# Patient Record
Sex: Female | Born: 1935
Health system: Southern US, Community
[De-identification: ages and names within clinical notes are randomized; demographics above are authoritative.]

## PROBLEM LIST (undated history)

## (undated) DIAGNOSIS — C50919 Malignant neoplasm of unspecified site of unspecified female breast: Secondary | ICD-10-CM

## (undated) DIAGNOSIS — N186 End stage renal disease: Secondary | ICD-10-CM

## (undated) DIAGNOSIS — E119 Type 2 diabetes mellitus without complications: Secondary | ICD-10-CM

## (undated) DIAGNOSIS — S82209A Unspecified fracture of shaft of unspecified tibia, initial encounter for closed fracture: Secondary | ICD-10-CM

## (undated) DIAGNOSIS — E785 Hyperlipidemia, unspecified: Secondary | ICD-10-CM

## (undated) DIAGNOSIS — E039 Hypothyroidism, unspecified: Secondary | ICD-10-CM

## (undated) DIAGNOSIS — I4891 Unspecified atrial fibrillation: Secondary | ICD-10-CM

## (undated) DIAGNOSIS — N2581 Secondary hyperparathyroidism of renal origin: Secondary | ICD-10-CM

## (undated) DIAGNOSIS — S82201A Unspecified fracture of shaft of right tibia, initial encounter for closed fracture: Secondary | ICD-10-CM

## (undated) DIAGNOSIS — R0602 Shortness of breath: Secondary | ICD-10-CM

## (undated) DIAGNOSIS — I10 Essential (primary) hypertension: Secondary | ICD-10-CM

## (undated) DIAGNOSIS — I509 Heart failure, unspecified: Secondary | ICD-10-CM

## (undated) DIAGNOSIS — S82409A Unspecified fracture of shaft of unspecified fibula, initial encounter for closed fracture: Secondary | ICD-10-CM

## (undated) DIAGNOSIS — M1990 Unspecified osteoarthritis, unspecified site: Secondary | ICD-10-CM

## (undated) HISTORY — DX: Hypothyroidism, unspecified: E03.9

## (undated) HISTORY — PX: MASTECTOMY: SHX3

## (undated) HISTORY — PX: EYE SURGERY: SHX253

## (undated) HISTORY — DX: Malignant neoplasm of unspecified site of unspecified female breast: C50.919

## (undated) HISTORY — PX: THYROID SURGERY: SHX805

## (undated) HISTORY — PX: ARTERIOVENOUS GRAFT PLACEMENT: SUR1029

## (undated) HISTORY — PX: ANKLE FUSION: SHX881

## (undated) HISTORY — PX: ABDOMINAL SURGERY: SHX537

## (undated) HISTORY — DX: End stage renal disease: N18.6

## (undated) HISTORY — DX: Unspecified atrial fibrillation: I48.91

## (undated) HISTORY — DX: Unspecified fracture of shaft of right tibia, initial encounter for closed fracture: S82.201A

## (undated) HISTORY — PX: BREAST SURGERY: SHX581

## (undated) HISTORY — DX: Unspecified fracture of shaft of unspecified fibula, initial encounter for closed fracture: S82.409A

## (undated) HISTORY — DX: Unspecified fracture of shaft of unspecified tibia, initial encounter for closed fracture: S82.209A

## (undated) HISTORY — DX: Hyperlipidemia, unspecified: E78.5

## (undated) HISTORY — DX: Essential (primary) hypertension: I10

## (undated) HISTORY — DX: Type 2 diabetes mellitus without complications: E11.9

---

## 2009-08-27 ENCOUNTER — Inpatient Hospital Stay (HOSPITAL_COMMUNITY)
Admission: EM | Admit: 2009-08-27 | Discharge: 2009-09-02 | Payer: Self-pay | Source: Home / Self Care | Admitting: Emergency Medicine

## 2009-08-27 ENCOUNTER — Ambulatory Visit: Payer: Self-pay | Admitting: Pulmonary Disease

## 2009-08-27 ENCOUNTER — Encounter (INDEPENDENT_AMBULATORY_CARE_PROVIDER_SITE_OTHER): Payer: Self-pay | Admitting: Internal Medicine

## 2009-08-28 ENCOUNTER — Encounter (INDEPENDENT_AMBULATORY_CARE_PROVIDER_SITE_OTHER): Payer: Self-pay | Admitting: Internal Medicine

## 2009-08-30 ENCOUNTER — Encounter (INDEPENDENT_AMBULATORY_CARE_PROVIDER_SITE_OTHER): Payer: Self-pay | Admitting: Internal Medicine

## 2009-08-31 ENCOUNTER — Ambulatory Visit: Payer: Self-pay | Admitting: Vascular Surgery

## 2009-09-19 ENCOUNTER — Ambulatory Visit: Payer: Self-pay | Admitting: Vascular Surgery

## 2009-10-06 ENCOUNTER — Encounter: Admission: RE | Admit: 2009-10-06 | Discharge: 2009-10-06 | Payer: Self-pay | Admitting: Nephrology

## 2009-10-09 ENCOUNTER — Ambulatory Visit (HOSPITAL_COMMUNITY): Admission: RE | Admit: 2009-10-09 | Discharge: 2009-10-09 | Payer: Self-pay | Admitting: Vascular Surgery

## 2009-10-09 ENCOUNTER — Ambulatory Visit: Payer: Self-pay | Admitting: Vascular Surgery

## 2009-10-16 DEATH — deceased

## 2010-06-03 LAB — DIFFERENTIAL
Basophils Absolute: 0 10*3/uL (ref 0.0–0.1)
Basophils Absolute: 0 10*3/uL (ref 0.0–0.1)
Basophils Relative: 0 % (ref 0–1)
Eosinophils Relative: 0 % (ref 0–5)
Eosinophils Relative: 1 % (ref 0–5)
Lymphocytes Relative: 7 % — ABNORMAL LOW (ref 12–46)
Lymphocytes Relative: 8 % — ABNORMAL LOW (ref 12–46)
Lymphs Abs: 1.2 10*3/uL (ref 0.7–4.0)
Lymphs Abs: 1.5 10*3/uL (ref 0.7–4.0)
Neutro Abs: 12.4 10*3/uL — ABNORMAL HIGH (ref 1.7–7.7)

## 2010-06-03 LAB — URINALYSIS, ROUTINE W REFLEX MICROSCOPIC
Nitrite: NEGATIVE
Specific Gravity, Urine: 1.027 (ref 1.005–1.030)
pH: 6 (ref 5.0–8.0)

## 2010-06-03 LAB — RENAL FUNCTION PANEL
Albumin: 1.6 g/dL — ABNORMAL LOW (ref 3.5–5.2)
Albumin: 1.7 g/dL — ABNORMAL LOW (ref 3.5–5.2)
BUN: 29 mg/dL — ABNORMAL HIGH (ref 6–23)
CO2: 25 mEq/L (ref 19–32)
CO2: 26 mEq/L (ref 19–32)
Calcium: 7.2 mg/dL — ABNORMAL LOW (ref 8.4–10.5)
Calcium: 7.8 mg/dL — ABNORMAL LOW (ref 8.4–10.5)
Chloride: 99 mEq/L (ref 96–112)
Creatinine, Ser: 4.19 mg/dL — ABNORMAL HIGH (ref 0.4–1.2)
GFR calc Af Amer: 12 mL/min — ABNORMAL LOW (ref >60)
GFR calc Af Amer: 7 mL/min — ABNORMAL LOW (ref >60)
GFR calc Af Amer: 7 mL/min — ABNORMAL LOW (ref >60)
GFR calc non Af Amer: 10 mL/min — ABNORMAL LOW (ref >60)
GFR calc non Af Amer: 6 mL/min — ABNORMAL LOW (ref >60)
Glucose, Bld: 162 mg/dL — ABNORMAL HIGH (ref 70–99)
Phosphorus: 3.7 mg/dL (ref 2.3–4.6)
Potassium: 3.7 mEq/L (ref 3.5–5.1)
Potassium: 3.8 mEq/L (ref 3.5–5.1)
Sodium: 135 mEq/L (ref 135–145)
Sodium: 136 mEq/L (ref 135–145)

## 2010-06-03 LAB — GLUCOSE, CAPILLARY
Glucose-Capillary: 120 mg/dL — ABNORMAL HIGH (ref 70–99)
Glucose-Capillary: 123 mg/dL — ABNORMAL HIGH (ref 70–99)
Glucose-Capillary: 145 mg/dL — ABNORMAL HIGH (ref 70–99)
Glucose-Capillary: 162 mg/dL — ABNORMAL HIGH (ref 70–99)
Glucose-Capillary: 172 mg/dL — ABNORMAL HIGH (ref 70–99)
Glucose-Capillary: 185 mg/dL — ABNORMAL HIGH (ref 70–99)
Glucose-Capillary: 19 mg/dL — CL (ref 70–99)
Glucose-Capillary: 193 mg/dL — ABNORMAL HIGH (ref 70–99)
Glucose-Capillary: 200 mg/dL — ABNORMAL HIGH (ref 70–99)
Glucose-Capillary: 213 mg/dL — ABNORMAL HIGH (ref 70–99)
Glucose-Capillary: 223 mg/dL — ABNORMAL HIGH (ref 70–99)
Glucose-Capillary: 308 mg/dL — ABNORMAL HIGH (ref 70–99)

## 2010-06-03 LAB — CBC
HCT: 23 % — ABNORMAL LOW (ref 36.0–46.0)
HCT: 31.7 % — ABNORMAL LOW (ref 36.0–46.0)
Hemoglobin: 10.5 g/dL — ABNORMAL LOW (ref 12.0–15.0)
Hemoglobin: 8.9 g/dL — ABNORMAL LOW (ref 12.0–15.0)
MCHC: 33.3 g/dL (ref 30.0–36.0)
MCHC: 33.4 g/dL (ref 30.0–36.0)
Platelets: 250 10*3/uL (ref 150–400)
Platelets: 322 10*3/uL (ref 150–400)
Platelets: 365 10*3/uL (ref 150–400)
RBC: 3.5 MIL/uL — ABNORMAL LOW (ref 3.87–5.11)
RDW: 17 % — ABNORMAL HIGH (ref 11.5–15.5)
RDW: 17 % — ABNORMAL HIGH (ref 11.5–15.5)
WBC: 14.9 10*3/uL — ABNORMAL HIGH (ref 4.0–10.5)
WBC: 15.3 10*3/uL — ABNORMAL HIGH (ref 4.0–10.5)
WBC: 22.2 10*3/uL — ABNORMAL HIGH (ref 4.0–10.5)

## 2010-06-03 LAB — CROSSMATCH
ABO/RH(D): A POS
Antibody Screen: NEGATIVE

## 2010-06-03 LAB — CULTURE, BLOOD (ROUTINE X 2): Culture: NO GROWTH

## 2010-06-03 LAB — URINE MICROSCOPIC-ADD ON

## 2010-06-03 LAB — BASIC METABOLIC PANEL
Calcium: 7.2 mg/dL — ABNORMAL LOW (ref 8.4–10.5)
Potassium: 4 mEq/L (ref 3.5–5.1)
Sodium: 136 mEq/L (ref 135–145)

## 2010-06-03 LAB — URINE CULTURE: Colony Count: >=100000

## 2010-06-04 LAB — CBC
HCT: 26.8 % — ABNORMAL LOW (ref 36.0–46.0)
HCT: 31.5 % — ABNORMAL LOW (ref 36.0–46.0)
Hemoglobin: 8.9 g/dL — ABNORMAL LOW (ref 12.0–15.0)
MCHC: 33 g/dL (ref 30.0–36.0)
MCHC: 33.3 g/dL (ref 30.0–36.0)
MCV: 90 fL (ref 78.0–100.0)
MCV: 90.3 fL (ref 78.0–100.0)
MCV: 90.7 fL (ref 78.0–100.0)
Platelets: 570 10*3/uL — ABNORMAL HIGH (ref 150–400)
RBC: 2.98 MIL/uL — ABNORMAL LOW (ref 3.87–5.11)
RBC: 3.03 MIL/uL — ABNORMAL LOW (ref 3.87–5.11)
RDW: 16.5 % — ABNORMAL HIGH (ref 11.5–15.5)
RDW: 16.6 % — ABNORMAL HIGH (ref 11.5–15.5)
RDW: 16.7 % — ABNORMAL HIGH (ref 11.5–15.5)

## 2010-06-04 LAB — URINE MICROSCOPIC-ADD ON

## 2010-06-04 LAB — CARDIAC PANEL(CRET KIN+CKTOT+MB+TROPI)
CK, MB: 3.5 ng/mL (ref 0.3–4.0)
CK, MB: 3.7 ng/mL (ref 0.3–4.0)
Relative Index: 3.4 — ABNORMAL HIGH (ref 0.0–2.5)
Relative Index: INVALID (ref 0.0–2.5)
Total CK: 99 U/L (ref 7–177)
Troponin I: 0.06 ng/mL (ref 0.00–0.06)
Troponin I: 0.11 ng/mL — ABNORMAL HIGH (ref 0.00–0.06)

## 2010-06-04 LAB — BRAIN NATRIURETIC PEPTIDE
Pro B Natriuretic peptide (BNP): 1602 pg/mL — ABNORMAL HIGH (ref 0.0–100.0)
Pro B Natriuretic peptide (BNP): 1673 pg/mL — ABNORMAL HIGH (ref 0.0–100.0)

## 2010-06-04 LAB — IRON AND TIBC
Saturation Ratios: 7 % — ABNORMAL LOW (ref 20–55)
TIBC: 178 ug/dL — ABNORMAL LOW (ref 250–470)

## 2010-06-04 LAB — URINE CULTURE

## 2010-06-04 LAB — DIFFERENTIAL
Basophils Absolute: 0.1 10*3/uL (ref 0.0–0.1)
Basophils Relative: 0 % (ref 0–1)
Eosinophils Absolute: 0.4 10*3/uL (ref 0.0–0.7)
Eosinophils Relative: 3 % (ref 0–5)
Neutrophils Relative %: 74 % (ref 43–77)

## 2010-06-04 LAB — GLUCOSE, CAPILLARY
Glucose-Capillary: 134 mg/dL — ABNORMAL HIGH (ref 70–99)
Glucose-Capillary: 153 mg/dL — ABNORMAL HIGH (ref 70–99)
Glucose-Capillary: 176 mg/dL — ABNORMAL HIGH (ref 70–99)
Glucose-Capillary: 179 mg/dL — ABNORMAL HIGH (ref 70–99)
Glucose-Capillary: 252 mg/dL — ABNORMAL HIGH (ref 70–99)
Glucose-Capillary: 305 mg/dL — ABNORMAL HIGH (ref 70–99)

## 2010-06-04 LAB — URINALYSIS, ROUTINE W REFLEX MICROSCOPIC
Glucose, UA: NEGATIVE mg/dL
Ketones, ur: NEGATIVE mg/dL
Leukocytes, UA: NEGATIVE
Nitrite: NEGATIVE
Specific Gravity, Urine: 1.012 (ref 1.005–1.030)
pH: 5 (ref 5.0–8.0)

## 2010-06-04 LAB — POCT I-STAT 3, ART BLOOD GAS (G3+)
Acid-base deficit: 6 mmol/L — ABNORMAL HIGH (ref 0.0–2.0)
Bicarbonate: 23.7 mEq/L (ref 20.0–24.0)
O2 Saturation: 99 %
Patient temperature: 98.6
Patient temperature: 99.4
TCO2: 22 mmol/L (ref 0–100)
TCO2: 25 mmol/L (ref 0–100)
pCO2 arterial: 43.2 mmHg (ref 35.0–45.0)
pCO2 arterial: 49.3 mmHg — ABNORMAL HIGH (ref 35.0–45.0)
pH, Arterial: 7.207 — ABNORMAL LOW (ref 7.350–7.400)
pH, Arterial: 7.45 — ABNORMAL HIGH (ref 7.350–7.400)

## 2010-06-04 LAB — HEMOGLOBIN A1C: Hgb A1c MFr Bld: 7.6 % — ABNORMAL HIGH (ref <5.7)

## 2010-06-04 LAB — BASIC METABOLIC PANEL
BUN: 52 mg/dL — ABNORMAL HIGH (ref 6–23)
CO2: 25 mEq/L (ref 19–32)
Calcium: 7.3 mg/dL — ABNORMAL LOW (ref 8.4–10.5)
Chloride: 104 mEq/L (ref 96–112)
Chloride: 109 mEq/L (ref 96–112)
Creatinine, Ser: 4.38 mg/dL — ABNORMAL HIGH (ref 0.4–1.2)
Creatinine, Ser: 6.16 mg/dL — ABNORMAL HIGH (ref 0.4–1.2)
GFR calc Af Amer: 12 mL/min — ABNORMAL LOW (ref >60)
Glucose, Bld: 313 mg/dL — ABNORMAL HIGH (ref 70–99)
Glucose, Bld: 340 mg/dL — ABNORMAL HIGH (ref 70–99)
Potassium: 4.1 mEq/L (ref 3.5–5.1)

## 2010-06-04 LAB — RENAL FUNCTION PANEL
BUN: 21 mg/dL (ref 6–23)
CO2: 25 mEq/L (ref 19–32)
Calcium: 7.5 mg/dL — ABNORMAL LOW (ref 8.4–10.5)
Creatinine, Ser: 4.96 mg/dL — ABNORMAL HIGH (ref 0.4–1.2)
Glucose, Bld: 112 mg/dL — ABNORMAL HIGH (ref 70–99)
Sodium: 139 mEq/L (ref 135–145)

## 2010-06-04 LAB — STREP PNEUMONIAE URINARY ANTIGEN: Strep Pneumo Urinary Antigen: NEGATIVE

## 2010-06-04 LAB — CULTURE, BLOOD (ROUTINE X 2): Culture: NO GROWTH

## 2010-06-04 LAB — CK TOTAL AND CKMB (NOT AT ARMC): Total CK: 120 U/L (ref 7–177)

## 2010-06-04 LAB — HEPATIC FUNCTION PANEL
AST: 20 U/L (ref 0–37)
Albumin: 2.2 g/dL — ABNORMAL LOW (ref 3.5–5.2)
Alkaline Phosphatase: 87 U/L (ref 39–117)
Total Bilirubin: 0.3 mg/dL (ref 0.3–1.2)

## 2010-06-04 LAB — POCT CARDIAC MARKERS: Myoglobin, poc: >500 ng/mL (ref 12–200)

## 2010-06-04 LAB — LEGIONELLA ANTIGEN, URINE: Legionella Antigen, Urine: NEGATIVE

## 2010-06-04 LAB — PTH, INTACT AND CALCIUM: PTH: 431.2 pg/mL — ABNORMAL HIGH (ref 14.0–72.0)

## 2010-06-04 LAB — PROCALCITONIN: Procalcitonin: 4.22 ng/mL

## 2010-06-04 LAB — PHOSPHORUS: Phosphorus: 3.7 mg/dL (ref 2.3–4.6)

## 2010-06-04 LAB — TSH: TSH: 5.552 u[IU]/mL — ABNORMAL HIGH (ref 0.350–4.500)

## 2010-06-04 LAB — POCT I-STAT, CHEM 8
Creatinine, Ser: 6.2 mg/dL — ABNORMAL HIGH (ref 0.4–1.2)
HCT: 34 % — ABNORMAL LOW (ref 36.0–46.0)
Hemoglobin: 11.6 g/dL — ABNORMAL LOW (ref 12.0–15.0)
Sodium: 137 mEq/L (ref 135–145)
TCO2: 19 mmol/L (ref 0–100)

## 2010-06-04 LAB — FERRITIN: Ferritin: 330 ng/mL — ABNORMAL HIGH (ref 10–291)

## 2010-07-14 ENCOUNTER — Emergency Department (HOSPITAL_COMMUNITY): Payer: Medicare Other

## 2010-07-14 ENCOUNTER — Inpatient Hospital Stay (HOSPITAL_COMMUNITY)
Admission: EM | Admit: 2010-07-14 | Discharge: 2010-07-17 | DRG: 291 | Disposition: A | Discharge status: Home / Self Care | Payer: Medicare Other | Attending: Family Medicine | Admitting: Family Medicine

## 2010-07-14 ENCOUNTER — Inpatient Hospital Stay (HOSPITAL_COMMUNITY): Payer: Medicare Other

## 2010-07-14 DIAGNOSIS — Z7982 Long term (current) use of aspirin: Secondary | ICD-10-CM

## 2010-07-14 DIAGNOSIS — N186 End stage renal disease: Secondary | ICD-10-CM | POA: Diagnosis present

## 2010-07-14 DIAGNOSIS — D631 Anemia in chronic kidney disease: Secondary | ICD-10-CM | POA: Diagnosis present

## 2010-07-14 DIAGNOSIS — R0902 Hypoxemia: Secondary | ICD-10-CM | POA: Diagnosis not present

## 2010-07-14 DIAGNOSIS — Z992 Dependence on renal dialysis: Secondary | ICD-10-CM

## 2010-07-14 DIAGNOSIS — N039 Chronic nephritic syndrome with unspecified morphologic changes: Secondary | ICD-10-CM | POA: Diagnosis present

## 2010-07-14 DIAGNOSIS — N2581 Secondary hyperparathyroidism of renal origin: Secondary | ICD-10-CM | POA: Diagnosis present

## 2010-07-14 DIAGNOSIS — Z794 Long term (current) use of insulin: Secondary | ICD-10-CM

## 2010-07-14 DIAGNOSIS — I509 Heart failure, unspecified: Principal | ICD-10-CM | POA: Diagnosis present

## 2010-07-14 DIAGNOSIS — E039 Hypothyroidism, unspecified: Secondary | ICD-10-CM | POA: Diagnosis present

## 2010-07-14 DIAGNOSIS — Z853 Personal history of malignant neoplasm of breast: Secondary | ICD-10-CM

## 2010-07-14 DIAGNOSIS — I120 Hypertensive chronic kidney disease with stage 5 chronic kidney disease or end stage renal disease: Secondary | ICD-10-CM | POA: Diagnosis present

## 2010-07-14 DIAGNOSIS — Z79899 Other long term (current) drug therapy: Secondary | ICD-10-CM

## 2010-07-14 DIAGNOSIS — E119 Type 2 diabetes mellitus without complications: Secondary | ICD-10-CM | POA: Diagnosis present

## 2010-07-14 HISTORY — DX: Heart failure, unspecified: I50.9

## 2010-07-14 LAB — BLOOD GAS, ARTERIAL
Bicarbonate: 26.9 mEq/L — ABNORMAL HIGH (ref 20.0–24.0)
Delivery systems: POSITIVE
FIO2: 0.6 %
O2 Saturation: 94.9 %
Patient temperature: 98.6
RATE: 12 resp/min
TCO2: 28.4 mmol/L (ref 0–100)
pH, Arterial: 7.354 (ref 7.350–7.400)

## 2010-07-14 LAB — POCT I-STAT 3, ART BLOOD GAS (G3+)
Bicarbonate: 29.8 mEq/L — ABNORMAL HIGH (ref 20.0–24.0)
O2 Saturation: 94 %
TCO2: 31 mmol/L (ref 0–100)
pCO2 arterial: 40.3 mmHg (ref 35.0–45.0)
pO2, Arterial: 65 mmHg — ABNORMAL LOW (ref 80.0–100.0)

## 2010-07-14 LAB — CBC
HCT: 30.4 % — ABNORMAL LOW (ref 36.0–46.0)
MCHC: 31.6 g/dL (ref 30.0–36.0)
MCV: 97.4 fL (ref 78.0–100.0)
RDW: 16.7 % — ABNORMAL HIGH (ref 11.5–15.5)

## 2010-07-14 LAB — CK TOTAL AND CKMB (NOT AT ARMC)
CK, MB: 2.8 ng/mL (ref 0.3–4.0)
Relative Index: 2.3 (ref 0.0–2.5)

## 2010-07-14 LAB — GLUCOSE, CAPILLARY: Glucose-Capillary: 318 mg/dL — ABNORMAL HIGH (ref 70–99)

## 2010-07-14 LAB — CARDIAC PANEL(CRET KIN+CKTOT+MB+TROPI)
CK, MB: 3 ng/mL (ref 0.3–4.0)
Relative Index: 2.3 (ref 0.0–2.5)

## 2010-07-14 LAB — DIFFERENTIAL
Basophils Absolute: 0 10*3/uL (ref 0.0–0.1)
Eosinophils Absolute: 0.8 10*3/uL — ABNORMAL HIGH (ref 0.0–0.7)
Eosinophils Relative: 8 % — ABNORMAL HIGH (ref 0–5)
Lymphocytes Relative: 12 % (ref 12–46)
Monocytes Absolute: 0.8 10*3/uL (ref 0.1–1.0)

## 2010-07-14 LAB — BASIC METABOLIC PANEL
BUN: 18 mg/dL (ref 6–23)
Calcium: 9 mg/dL (ref 8.4–10.5)
GFR calc non Af Amer: 10 mL/min — ABNORMAL LOW (ref >60)
Glucose, Bld: 131 mg/dL — ABNORMAL HIGH (ref 70–99)
Sodium: 142 mEq/L (ref 135–145)

## 2010-07-14 LAB — MRSA PCR SCREENING: MRSA by PCR: NEGATIVE

## 2010-07-15 ENCOUNTER — Inpatient Hospital Stay (HOSPITAL_COMMUNITY): Payer: Medicare Other

## 2010-07-15 DIAGNOSIS — I059 Rheumatic mitral valve disease, unspecified: Secondary | ICD-10-CM

## 2010-07-15 DIAGNOSIS — N186 End stage renal disease: Secondary | ICD-10-CM

## 2010-07-15 DIAGNOSIS — I5023 Acute on chronic systolic (congestive) heart failure: Secondary | ICD-10-CM

## 2010-07-15 LAB — CBC
HCT: 27.4 % — ABNORMAL LOW (ref 36.0–46.0)
Hemoglobin: 8.9 g/dL — ABNORMAL LOW (ref 12.0–15.0)
MCH: 31.4 pg (ref 26.0–34.0)
MCHC: 32.5 g/dL (ref 30.0–36.0)
MCV: 96.8 fL (ref 78.0–100.0)
RDW: 16.7 % — ABNORMAL HIGH (ref 11.5–15.5)

## 2010-07-15 LAB — GLUCOSE, CAPILLARY
Glucose-Capillary: 97 mg/dL (ref 70–99)
Glucose-Capillary: 224 mg/dL — ABNORMAL HIGH (ref 70–99)
Glucose-Capillary: 192 mg/dL — ABNORMAL HIGH (ref 70–99)

## 2010-07-15 LAB — RENAL FUNCTION PANEL
BUN: 23 mg/dL (ref 6–23)
CO2: 29 mEq/L (ref 19–32)
Calcium: 9 mg/dL (ref 8.4–10.5)
Creatinine, Ser: 5.09 mg/dL — ABNORMAL HIGH (ref 0.4–1.2)
Glucose, Bld: 163 mg/dL — ABNORMAL HIGH (ref 70–99)
Phosphorus: 3.7 mg/dL (ref 2.3–4.6)
Sodium: 139 mEq/L (ref 135–145)

## 2010-07-15 LAB — HEPATITIS B SURFACE ANTIGEN: Hepatitis B Surface Ag: NEGATIVE

## 2010-07-16 ENCOUNTER — Inpatient Hospital Stay (HOSPITAL_COMMUNITY): Payer: Medicare Other

## 2010-07-16 ENCOUNTER — Encounter (HOSPITAL_COMMUNITY): Payer: Self-pay

## 2010-07-16 LAB — RENAL FUNCTION PANEL
Albumin: 3.4 g/dL — ABNORMAL LOW (ref 3.5–5.2)
BUN: 20 mg/dL (ref 6–23)
Creatinine, Ser: 4.52 mg/dL — ABNORMAL HIGH (ref 0.4–1.2)
Phosphorus: 4.1 mg/dL (ref 2.3–4.6)

## 2010-07-16 LAB — CBC
MCH: 31.1 pg (ref 26.0–34.0)
MCV: 97.6 fL (ref 78.0–100.0)
Platelets: 240 10*3/uL (ref 150–400)
RDW: 16.4 % — ABNORMAL HIGH (ref 11.5–15.5)

## 2010-07-16 LAB — GLUCOSE, CAPILLARY: Glucose-Capillary: 216 mg/dL — ABNORMAL HIGH (ref 70–99)

## 2010-07-16 MED ORDER — IOHEXOL 300 MG/ML  SOLN
70.0000 mL | Freq: Once | INTRAMUSCULAR | Status: AC | PRN
  Administered 2010-07-16: 70 mL via INTRAVENOUS

## 2010-07-16 NOTE — Consult Note (Signed)
NAMECLAUDENE, [name]               ACCOUNT NO.:  [id number]  MEDICAL RECORD NO.:  [id number]           PATIENT TYPE:  I  LOCATION:  3308                         FACILITY:  [facility]  PHYSICIAN:  Maree Krabbe, M.D.DATE OF BIRTH:  08-27-1928  DATE OF CONSULTATION: DATE OF DISCHARGE:                                CONSULTATION   REASON FOR CONSULT:  Fluid overload and congestive heart failure in a dialysis patient from [place].  HISTORY:  The patient is a 77 year old female with end-stage renal disease on dialysis in [place], [place], on Monday, Wednesday and Friday schedule.  She has longstanding diabetes and significant hypertension problems.  She was admitted here last year and started on dialysis at that time.  She visits her daughter here every so often and was in town this weekend when she developed shortness of breath.  She came to emergency room where she was found to have a blood pressure 187/60, O2 sat 100% on 2 L and congestive heart failure on x-ray.  She has also small bilateral pleural effusions, potassium 4.3, hemoglobin of 9.6.   Cardiac enzymes were negative and the patient was admitted to the step-down unit.  BNP was 840.  She was given IV Lasix.  In the evening she worsened and developed respiratory distress with oxygen saturation in the mid 80s on 2 L nasal cannula with pursed lip breathing; 100% nonrebreather mask oxygen was started and then BiPAP and the patient is feeling better now. Repeat x-ray shows worsening pulmonary edema right greater than left with alveolar changes.  The renal service is asked to consult for acute dialysis.  Currently, the patient's main complaint is shortness of breath but is better on the BiPAP.  She denies any recent fever, chills, sweats, purulent sputum production, chest pain, abdominal pain, nausea, vomiting, diarrhea.  Denies any difficulty voiding or dysuria, joint pain or swelling, focal skin rash or itching,  focal numbness or weakness.  Also the patient 2 weeks ago was seen by her primary care doctor for shortness of breath and given antibiotics for probable pneumonia.  There is shortness breath, persistent after taking antibiotics and despite extra fluid being dialyzed off.  The primary care doctor ordered home oxygen and CT scan of the chest and was referred to pulmonologist.  The CT scan of the chest, I do not believe it has been done yet.  PAST MEDICAL HISTORY: 1. ESRD, dialysis in [facility] Monday, Wednesday and Friday. 2. Hypothyroid. 3. History of breast cancer with left mastectomy. 4. Diabetes. 5. Hypertension.  She has been on minoxidil in the past. 6. Anemia due to renal failure. 7. Echocardiogram June 11, EF 60%, mild TR.  PAST SURGICAL HISTORY:  Left mastectomy, right shoulder surgery.  MEDICATIONS ON ADMISSION: 1. Imdur. 2. Aspirin. 3. Clonidine 0.2 b.i.d. 4. Synthroid. 5. Norvasc 10 daily. 6. 75/25 insulin. 7. Losartan 100 daily. 8. Proventil p.r.n. 9. Singulair 10 mg a day.  ALLERGIES:  To CODEINE.  SOCIAL HISTORY:  Lives alone, independent of all ADLs.  No tobacco or alcohol use.  FAMILY HISTORY:  Noncontributory.  REVIEW OF SYSTEMS:  See above.  PHYSICAL EXAMINATION:  VITAL SIGNS:  Blood pressure is 180/60, respirations currently 20 on BiPAP, heart rate 90s, temperature 98.2. GENERAL:  This is a pleasant thin framed female in no distress.  She is alert and calm and unresponsive. SKIN:  Warm and dry without rash, cyanosis. HEENT:  PERRLA, EOMI. NECK:  Supple.  There is JVD to the angle of the jaw. CHEST:  Bibasilar crackles about 1/3rd of the posterior lung fields are noted.  No wheezing or rhonchi or bronchial breath sounds. CARDIAC:  Regular rate and rhythm with a good normal heart sounds.  No heaves or lifts.  No murmur, rub or gallop. BREASTS:  Left mastectomy. ABDOMEN:  Soft, nontender, active bowel sounds.  No masses or ascites. EXTREMITIES:  No  edema in the legs or arms.  She has a right upper arm AV graft which has a good thrill and bruit.  She has old scars from previous graft surgery on the right forearm. GU:  Deferred. NEUROLOGIC:  Alert and oriented x3.  No focal deficits.  LABORATORY FINDINGS:  White blood count is 10,000, hemoglobin 9.6, BUN 18, CO2 28, potassium 4.3.  Cardiac panel negative.  Chest x-ray results as above.  IMPRESSION: 1. Shortness of breath due to pulmonary edema.  The patient may have     some undiagnosed underlying lung disease  (pulmonary     fibrosis, etc). We will plan on dialyzing emergently     tonight and remove as much fluid as possible.  Suggest pulmonary     evaluation, possible chest CT, after extra volume removed. 2. End stage renal disease dialyzes Monday, Wednesday and Friday, in     [place]. 3. Diabetes, on 75/25 insulin. 4. Hypertension on ARB, clonidine, and Norvasc. 5. Hypothyroidism. 6. History of breast cancer, on remission.  RECOMMENDATIONS:  See orders and will follow.     Maree Krabbe, M.D.     RDS/MEDQ  D:  07/14/2010  T:  07/15/2010  Job:  [id number]  Electronically Signed by Delano Metz M.D. on 07/16/2010 08:11:01 AM

## 2010-07-17 ENCOUNTER — Inpatient Hospital Stay (HOSPITAL_COMMUNITY): Payer: Medicare Other

## 2010-07-17 LAB — BASIC METABOLIC PANEL
Calcium: 8.9 mg/dL (ref 8.4–10.5)
Creatinine, Ser: 4.3 mg/dL — ABNORMAL HIGH (ref 0.4–1.2)
GFR calc Af Amer: 12 mL/min — ABNORMAL LOW (ref >60)
GFR calc non Af Amer: 10 mL/min — ABNORMAL LOW (ref >60)
Glucose, Bld: 253 mg/dL — ABNORMAL HIGH (ref 70–99)
Sodium: 137 mEq/L (ref 135–145)

## 2010-07-17 LAB — GLUCOSE, CAPILLARY
Glucose-Capillary: 203 mg/dL — ABNORMAL HIGH (ref 70–99)
Glucose-Capillary: 277 mg/dL — ABNORMAL HIGH (ref 70–99)

## 2010-07-19 NOTE — Discharge Summary (Signed)
[name], WOODSIDE               ACCOUNT NO.:  [id number]  MEDICAL RECORD NO.:  [id number]           PATIENT TYPE:  I  LOCATION:  6730                         FACILITY:  [facility]  PHYSICIAN:  Nestor Ramp, MD        DATE OF BIRTH:  [birth year]  DATE OF ADMISSION:  07/14/2010 DATE OF DISCHARGE:  07/17/2010                              DISCHARGE SUMMARY   DISCHARGE DIAGNOSES: 1. End-stage renal disease, dialysis previously scheduled Monday, Wednesday, and     Friday will need to be changed to Tuesday, THursday and Saturday as there is no room on outpatient dialysis schedule for MWF. She is being dialysed on day of d/ to catch her up to this schedule.. 2. Hypertension. 3. Hypothyroidism. 4. Moderate valvular insufficiency per ECHO, changed from 2011 whenit was listed as mild. Mitral, tricuspid and aortic valves.. 5. Diabetes. 6. History of breast cancer with left mastectomy. 7. Anemia due to renal failure.  DISCHARGE MEDICATIONS: 1. Aspirin 81 mg p.o. daily. 2. Calcium acetate p.o. t.i.d. 3. Clonidine 0.2 mg p.o. b.i.d. 4. Humalog 75/25 5-10 units subcu b.i.d. 5. Imdur 30 mg p.o. daily. 6. Losartan 100 mg p.o. nightly. 7. Norvasc 10 mg p.o. nightly. 8. Albuterol 2 puffs inhaled 4 times daily. 9. Simvastatin 10 mg p.o. nightly. 10.Synthroid 50 mcg p.o. daily. 11.Vitamin D 50,000 units p.o. weekly on Friday. 12.Vitamin E 1 tablet p.o. daily. 13.Vol-Care 1 tablet p.o. nightly.  CONSULTS: 1. Renal. 2. Dr. Hermelinda Medicus with Cardiology.  PROCEDURES: 1. Chest x-ray on April 28 showing probable mild CHF with     cardiomegaly, mild congestion and small pleural effusions. 2. Chest x-ray on April 28 approximately 12 hours after initial     showing worsening CHF now with asymmetric airspace pulmonary edema,     right greater than left stable, moderate-sized bilateral pleural     effusions. 3. Chest x-ray on April 30, showing improvement in congestive heart     failure pattern. 4. Chest  CT with contrast showing patchy ground glass opacification     was consistent with pulmonary edema as well as a 5 mm right lower     lobe nodule.  Recommwend Followup chest CT in 6-12 month if at high risk for bronchogenic carcinoma,     otherwise follow up chest CT scan needed at 12 months.  Also with mild     precarinal adenopathy can be reevaluated at followup, likely     reactive and may be related  to CHF, also with bilateral pleural     effusions with cardiomegaly and coronary artery atherosclerosis. 5. Echocardiogram on April 30 showing normal systolic function with an     ejection fraction of 50-55%, normal wall motion, moderate aortic     valve regurgitation, mild diffuse thickening and calcification of     the anterior and posterior leaflets in the mitral valve with     moderate involvement of the cord, moderate mitral valve regurg,     moderate tricuspid valve regurg.  Pulmonary artery peak pressure of     46 mmHg.  Right ventricular systolic pressure increased and  consistent with mild pulmonary hypertension.  LABORATORY DATA:  On admission, the patient's CBC showed a WBC 10.1, hemoglobin 9.6, hematocrit 30.4, platelets 271.  Initial BMET was unremarkable with the exception of creatinine of 4.15, otherwise within normal limits.  Hemoglobin A1c 7.2.  BNP on admission 841.  Cardiac enzymes were negative x2 with troponin of 0.05, 0.06.  TSH 5.083, negative hepatitis B surface antigen.  On the day of discharge, the patient's CBC was relatively unchanged, WBC 8.4, hemoglobin 8.9, platelets 240.  BMET unremarkable with the exception of a creatinine of 4.30.  BRIEF HOSPITAL COURSE:  This is a 77 year old female with known end- stage renal disease on hemodialysis who presented with progressive shortness of breath and new oxygen requirement. 1. Shortness of breath.  This was felt likely to be due to fluid     overload.  The patient's oxygen requirements and shortness of      breath greatly improved after 2 hemodialysis sessions.  The patient     states that she had not missed any hemodialysis sessions and had     been taken back down to her dry weight on Friday April 27, however,     became acutely short of breath Friday afternoon which worsened on     Saturday April 28.  The patient was brought into the hospital and     chest x-ray showed small effusion.  The patient acutely worsened     and was transferred to step-down unit with a chest x-ray showing     worsening asymmetric pulmonary edema.  After her first hemodialysis     session, the patient improved somewhat.  After her second, she had     improved greatly.  Her CT chest was mostly consistent with     pulmonary edema, however, there was a right lower lobe lung nodule     which should be followed up by the patient's PCP.  Cardiac causes     were also looked for, troponins were cycled and were negative.  The     patient's BNP was elevated at 841, however this was not surprising.     The patient's echocardiogram showed normal systolic function with a     slightly decreased ejection fraction to 50-55% which is changed     from her previous echo in July 2011 which showed some diastolic dysfunction with an EF of 60-65%. .  The patient's valvular     regurgitation had also worsened now with moderate aortic, moderate     mitral and moderate tricuspid valve regurg.  The patient also had     mitral valve calcification extending to the cord and had increased     right ventricular systolic pressure consistent with mild pulmonary     hypertension.  On the day of discharge, the patient no longer had     an oxygen requirement with maintaining her saturation greater than     94% on room air.  In addition, the patient was ambulated and did     not become acutely short of breath and did not desaturate while on     room air with ambulation. 2. End-stage renal disease.  Renal was consulted in order to continue     the  patient's hemodialysis.  The patient was hemodialyzed on Monday     April 29 and Monday April 30.  Initially, plans were to keep the     patient on her regular home Monday, Wednesday, Friday schedule.  However outpatient dialysis in [place] was set up and the     patient only be accommodated on Tuesday, Thursday, Saturday.     Therefore the patient was dialyzed again on Tuesday May 1, not for     symptoms but to maintain a normal amount of time between dialysis     sessions.  The patient will continue with Tuesday, Thursday,     Saturday dialysis while in [place], but will likely go back to     her Monday, Wednesday, Friday schedule once she returns to     [place]. 3. Diabetes.  The patient's A1c was 7.2.  CBGs remained stable on     sensitive sliding-scale insulin.  The patient will continue her     home regimen on discharge. 4. Hypothyroidism.  The patient with known hypothyroidism, had a     slightly elevated TSH to 5.083.  However, no changes were made to     her medication, Synthroid 50 mcg daily was continued and this can     be followed up by her PCP when she returns to [place] 5. Hypertension.  The patient was continued on her home Norvasc,     clonidine, Lasix and Imdur.  Blood pressures remained acceptable in     the 140s-170s.  No acute changes were made. 6. Anemia.  This is likely anemia of chronic renal disease, renal     started, Aranesp, on hemodialysis on Mondays to assist with this.     Hemoglobin remained stable while the patient was in-house.  DISCHARGE INSTRUCTIONS:  The patient was instructed to increase activity slowly and continue a low-sodium heart-healthy diet.  The patient was also instructed to keep all of her dialysis appointments and was informed that while in [place] they would be Tuesday, Thursday, Saturday with her most recent hemodialysis session being just prior to discharge on Tuesday May 1.  RECOMMENDED FOLLOW-UP ISSUES for PCP to  consider: 1. CT chest in 6-12 months to follow 6 mm RLL pulmonary nodule 2. Cardiology consult: Patient's episode of acute pulmonary edema despite adequate hemodialysis may be related to worsening valvular function per ECHO above. Is not in need of valvular repair or intervention atthis time, but given her fragility of volume  control, adequate management of her combined systolic / diastolic dysfunction with no moderate regurgiitation in 3 valves may need cardiology management.  FOLLOWUP APPOINTMENTS:  The patient is to follow up at her dialysis center on scheduled Tuesday, Thursday, Saturday.  In addition, the patient is to follow up with her primary care physician Dr. Lovett Sox in [place] in 2-4 weeks when she returns to [place]. In the interim, she may contact Dr Jennette Kettle here at [facility] if she needs outpatient follow up while her in [place] and this has been commumicated to her daughter who works for [employer].  DISCHARGE CONDITION:  The patient was discharged to home in stable medical condition without any oxygen requirement after being dialyzed just prior to discharge.    ______________________________ Demetria Pore, MD   ______________________________ Nestor Ramp, MD    JM/MEDQ  D:  07/17/2010  T:  07/18/2010  Job:  [id number]  cc:   Lovett Sox  Electronically Signed by Demetria Pore MD on 07/18/2010 11:44:51 AM Electronically Signed by Denny Levy MD on 07/19/2010 09:51:21 AM

## 2010-07-31 NOTE — Assessment & Plan Note (Signed)
OFFICE VISIT   [name], REDMOND  DOB:  [birth year]                                       09/19/2009  CHART#:[id number]   The patient had AV graft inserted in the right forearm by me on June 16.  The graft is functioning nicely with a strong pulse and palpable thrill.  There are no symptoms of significant steal in the right hand.  On,  examination incisions have healed well with a good pulse and palpable  thrill and a 2+ radial pulse distally with a well-perfused right hand.  She has a Palindrome catheter in the right internal jugular vein, which  can be removed after utilizing the graft successfully beginning on July  19.  Renal service can contact our office and we will schedule removal  of the catheter following successful utilization of the graft.     Quita Skye Hart [place], M.D.  Electronically Signed   JDL/MEDQ  D:  09/19/2009  T:  09/20/2009  Job:  [id number]   cc:   Mindi Slicker. Lowell Guitar, M.D.

## 2010-08-17 NOTE — H&P (Signed)
NAMELUVERN, MISCHKE               ACCOUNT NO.:  [id number]  MEDICAL RECORD NO.:  [id number]           PATIENT TYPE:  I  LOCATION:  3308                         FACILITY:  [facility]  PHYSICIAN:  Pearlean Brownie, M.D.DATE OF BIRTH:  May 14, 1928  DATE OF ADMISSION:  07/14/2010 DATE OF DISCHARGE:                             HISTORY & PHYSICAL   PRIMARY CARE PROVIDER:  Lovett Sox, Claris Gower, [place].  CHIEF COMPLAINT:  Shortness of breath.  HISTORY OF PRESENT ILLNESS:  A 77 year old pleasant female who presents with progressive shortness of breath, seen by PCP 2 weeks ago for shortness of breath.  Of note, she has not missed any HD sessions as she has end-stage renal disease and is on dialysis.  The patient was started on antibiotics at that time for probable pneumonia, which she completed. Shortness of breath persisted despite the antibiotics as well as extra fluid being dialyzed also on Friday.  PCP ordered home O2 yesterday. The patient wants to have CT of chest and referral to pulmonologist. The patient is currently in [place], [place], visiting her daughter when daughter noticed the patient was having more shortness of breath with ambulating only a few feet.  REVIEW OF SYSTEMS:  No fever or chills.  No chest pain.  Positive for shortness of breath.  Positive for productive cough.  No abdominal pain. No hemoptysis.  No dysuria.  No change in bowels.  No change in medication.  No sick contacts.  No change in mentation.  PAST MEDICAL HISTORY: 1. End stage renal disease, on HD Monday, Wednesday, Friday.  Of note,     the patient still makes some urine. 2. Hypothyroidism. 3. Breast cancer, status post left mastectomy. 4. Diabetes mellitus. 5. Hypertension. 6. Anemia of renal disease.  The patient had echo in June 2011 with     the EF of 60-65% and mild tricuspid regurgitation.  PAST SURGICAL HISTORY: 1. Left breast mastectomy. 2. Right shoulder  surgery.  ALLERGIES:  CODEINE.  MEDICATIONS: 1. Aspirin 81 mg daily. 2. Imdur 30 mg daily. 3. Simvastatin 10 mg daily. 4. Clonidine 0.2 mg b.i.d. 5. Synthroid 50 mcg daily. 6. Vol-Care nightly. 7. Norvasc 10 mg daily. 8. Losartan 100 mg daily. 9. Humalog 75/25, 5 units q.a.m., 10 units subcu q.p.m. 10.Proventil 90 mcg p.r.n. 11.Calcium acetate 667 mg 1 capsule t.i.d. 12.Vitamin D 50,000 units weekly. 13.Vitamin E daily.  FAMILY HISTORY:  Noncontributory.  SOCIAL HISTORY:  The patient lives alone.  She is independent of all activities.  Her husband is recently deceased.  No tobacco, no illicit. No alcohol.  PHYSICAL EXAM:  VITAL SIGNS:  Temperature 98.2, heart rate 105, respiratory rate 26, blood pressure 187/60, O2 sat 100% on 2 L. GENERAL:  In no acute distress, pleasant, alert, and oriented x3. HEENT:  PERRLA.  EOMI.  Nonicteric.  Moist mucous membranes.  Oropharynx is clear. NECK:  Supple. CVS:  Regular rate and rhythm.  No murmur.  Heart rate in 90s. RESPIRATORY:  Bilateral wet crackles.  No rhonchi.  No wheeze. Increased work of breathing with coughing spells, noted thick white sputum. ABDOMEN:  Soft, nontender, nondistended.  Normoactive bowel sounds. EXTREMITIES:  No edema.  Right arm has a fistula with positive thrill and bruit.  IMAGING:  Chest x-ray, mild CHF.  The patient with small pleural effusions and venous congestion, cardiomegaly.  EKG, sinus tachycardia, PVCs noted.  LABORATORY DATA:  Sodium 142, potassium 4.3, chloride 104, CO2 of 28, BUN 18, creatinine 4.15, glucose 131, calcium 9.0.  CBC:  White count 10.1, hemoglobin 9.6, hematocrit 30.4, platelets 271.  BNP 841.  Point- of-care troponin less than 0.05, myoglobin 375, CK-MB 1.76.  ABG:  pH 7.47, CO2 of 40.3, PO2 of 65 on 2 L, bicarb 29.8.  ASSESSMENT AND PLAN:  A 77 year old female admitted with shortness of breath in the setting of end-stage renal disease, on hemodialysis. 1. Dyspnea,  progressive dyspnea, chest x-ray and exam concerning for     fluid overload whether this is secondary to renal disease or a     newly diagnosed congestive heart failure.  Other differentials;     possible residual atypical pneumonia, however, there is no     leukocytosis or fever.  No definite infiltrate on chest x-ray. 2. Pulmonary embolism is less likely, however, we will obtain V/Q     scan.  Would prefer to defer from using contrast as the patient     still makes some urine.  Cardiac origin with elevated BNP, concern     for congestive heart failure for possible other arrhythmia.  We     will give the patient a Lasix challenge, emergent HD is not needed     at this time.  We will cycle cardiac enzymes, repeat EKG in a.m.     Chest x-ray to be repeated in a.m.  Obtain 2-D echo, V/Q scan     pending in the ED.  O2 therapy, of note the patient decompensates     it any way.  We will add antibiotics as she was previously treated     for pneumonia though the regimen is unknown. 3. End-stage renal disease.  The patient makes some urine at this     point, we will consult nephrology in the a.m. 4. Diabetes mellitus.  Per patient and daughter, her blood sugars have     been running very low.  The patient also was given doses of insulin     as needed, for now sliding scale insulin and monitor.  We will not     place on her home long acting mix. 5. Hypertension.  Elevated BP.  Currently continue home meds. 6. Hypothyroidism.  Check TSH.  Continue Synthroid. 7. Fluids, electrolytes, nutrition/gastrointestinal, p.o. ad lib,     saline lock IV. 8. Prophylaxis.  Lovenox 30 subcu daily. 9. Full code. 10.Disposition.  Pending workup, doubt the patient will need any     assistance at discharge as she is very independent unless she     decompensates.     Milinda Antis, MD   ______________________________ Pearlean Brownie, M.D.    KD/MEDQ  D:  07/14/2010  T:  07/15/2010  Job:   [id number]  Electronically Signed by Milinda Antis MD on 08/10/2010 10:26:02 AM Electronically Signed by Pearlean Brownie M.D. on 08/17/2010 01:40:32 PM

## 2011-07-10 ENCOUNTER — Observation Stay (HOSPITAL_COMMUNITY): Payer: Medicare Other

## 2011-07-10 ENCOUNTER — Encounter (HOSPITAL_COMMUNITY): Payer: Self-pay | Admitting: *Deleted

## 2011-07-10 ENCOUNTER — Telehealth: Payer: Self-pay | Admitting: Internal Medicine

## 2011-07-10 ENCOUNTER — Inpatient Hospital Stay (HOSPITAL_COMMUNITY)
Admission: EM | Admit: 2011-07-10 | Discharge: 2011-07-14 | DRG: 562 | Disposition: A | Discharge status: Home Health | Payer: Medicare Other | Attending: Internal Medicine | Admitting: Internal Medicine

## 2011-07-10 ENCOUNTER — Emergency Department (HOSPITAL_COMMUNITY): Payer: Medicare Other

## 2011-07-10 DIAGNOSIS — D638 Anemia in other chronic diseases classified elsewhere: Secondary | ICD-10-CM | POA: Diagnosis present

## 2011-07-10 DIAGNOSIS — Z7982 Long term (current) use of aspirin: Secondary | ICD-10-CM

## 2011-07-10 DIAGNOSIS — S82409A Unspecified fracture of shaft of unspecified fibula, initial encounter for closed fracture: Principal | ICD-10-CM | POA: Diagnosis present

## 2011-07-10 DIAGNOSIS — Z888 Allergy status to other drugs, medicaments and biological substances status: Secondary | ICD-10-CM

## 2011-07-10 DIAGNOSIS — N186 End stage renal disease: Secondary | ICD-10-CM | POA: Diagnosis present

## 2011-07-10 DIAGNOSIS — I120 Hypertensive chronic kidney disease with stage 5 chronic kidney disease or end stage renal disease: Secondary | ICD-10-CM | POA: Diagnosis present

## 2011-07-10 DIAGNOSIS — D649 Anemia, unspecified: Secondary | ICD-10-CM | POA: Diagnosis present

## 2011-07-10 DIAGNOSIS — R1110 Vomiting, unspecified: Secondary | ICD-10-CM | POA: Diagnosis present

## 2011-07-10 DIAGNOSIS — S82209A Unspecified fracture of shaft of unspecified tibia, initial encounter for closed fracture: Principal | ICD-10-CM | POA: Diagnosis present

## 2011-07-10 DIAGNOSIS — I10 Essential (primary) hypertension: Secondary | ICD-10-CM | POA: Diagnosis present

## 2011-07-10 DIAGNOSIS — E119 Type 2 diabetes mellitus without complications: Secondary | ICD-10-CM | POA: Diagnosis present

## 2011-07-10 DIAGNOSIS — E785 Hyperlipidemia, unspecified: Secondary | ICD-10-CM | POA: Diagnosis present

## 2011-07-10 DIAGNOSIS — S82301A Unspecified fracture of lower end of right tibia, initial encounter for closed fracture: Secondary | ICD-10-CM

## 2011-07-10 DIAGNOSIS — S82201A Unspecified fracture of shaft of right tibia, initial encounter for closed fracture: Secondary | ICD-10-CM | POA: Diagnosis present

## 2011-07-10 DIAGNOSIS — W010XXA Fall on same level from slipping, tripping and stumbling without subsequent striking against object, initial encounter: Secondary | ICD-10-CM | POA: Diagnosis present

## 2011-07-10 DIAGNOSIS — Z794 Long term (current) use of insulin: Secondary | ICD-10-CM

## 2011-07-10 HISTORY — DX: Unspecified fracture of shaft of unspecified tibia, initial encounter for closed fracture: S82.209A

## 2011-07-10 HISTORY — DX: Unspecified osteoarthritis, unspecified site: M19.90

## 2011-07-10 HISTORY — DX: Unspecified fracture of shaft of unspecified fibula, initial encounter for closed fracture: S82.409A

## 2011-07-10 LAB — BASIC METABOLIC PANEL
Calcium: 9.4 mg/dL (ref 8.4–10.5)
GFR calc non Af Amer: 4 mL/min — ABNORMAL LOW (ref >90)
Glucose, Bld: 176 mg/dL — ABNORMAL HIGH (ref 70–99)
Sodium: 142 mEq/L (ref 135–145)

## 2011-07-10 LAB — CREATININE, SERUM
Creatinine, Ser: 8.24 mg/dL — ABNORMAL HIGH (ref 0.50–1.10)
GFR calc Af Amer: 5 mL/min — ABNORMAL LOW (ref >90)
GFR calc non Af Amer: 4 mL/min — ABNORMAL LOW (ref >90)

## 2011-07-10 LAB — CBC
HCT: 35.7 % — ABNORMAL LOW (ref 36.0–46.0)
Hemoglobin: 11.5 g/dL — ABNORMAL LOW (ref 12.0–15.0)
MCH: 32.2 pg (ref 26.0–34.0)
MCHC: 31.8 g/dL (ref 30.0–36.0)
Platelets: 235 10*3/uL (ref 150–400)
RBC: 3.42 MIL/uL — ABNORMAL LOW (ref 3.87–5.11)
RBC: 3.47 MIL/uL — ABNORMAL LOW (ref 3.87–5.11)
RDW: 13.5 % (ref 11.5–15.5)

## 2011-07-10 LAB — HEMOGLOBIN A1C
Hgb A1c MFr Bld: 7.2 % — ABNORMAL HIGH (ref <5.7)
Mean Plasma Glucose: 160 mg/dL — ABNORMAL HIGH (ref <117)

## 2011-07-10 LAB — GLUCOSE, CAPILLARY
Glucose-Capillary: 151 mg/dL — ABNORMAL HIGH (ref 70–99)
Glucose-Capillary: 84 mg/dL (ref 70–99)

## 2011-07-10 LAB — MAGNESIUM: Magnesium: 2.8 mg/dL — ABNORMAL HIGH (ref 1.5–2.5)

## 2011-07-10 LAB — TSH: TSH: 5.463 u[IU]/mL — ABNORMAL HIGH (ref 0.350–4.500)

## 2011-07-10 MED ORDER — NEPRO/CARBSTEADY PO LIQD
237.0000 mL | ORAL | Status: DC | PRN
  Filled 2011-07-10: qty 237

## 2011-07-10 MED ORDER — SODIUM CHLORIDE 0.9 % IV SOLN: 100.0000 mL | INTRAVENOUS | Status: DC | PRN

## 2011-07-10 MED ORDER — HYDROMORPHONE HCL PF 1 MG/ML IJ SOLN
1.0000 mg | INTRAMUSCULAR | Status: DC | PRN
  Administered 2011-07-10 – 2011-07-12 (×7): 1 mg via INTRAVENOUS
  Filled 2011-07-10 (×7): qty 1

## 2011-07-10 MED ORDER — HYDROMORPHONE HCL PF 1 MG/ML IJ SOLN
1.0000 mg | Freq: Once | INTRAMUSCULAR | Status: AC
  Administered 2011-07-10: 1 mg via INTRAVENOUS
  Filled 2011-07-10: qty 1

## 2011-07-10 MED ORDER — ACETAMINOPHEN 325 MG PO TABS: 650.0000 mg | ORAL_TABLET | Freq: Four times a day (QID) | ORAL | Status: DC | PRN

## 2011-07-10 MED ORDER — ALTEPLASE 2 MG IJ SOLR: 2.0000 mg | Freq: Once | INTRAMUSCULAR | Status: AC | PRN

## 2011-07-10 MED ORDER — ACETAMINOPHEN ER 650 MG PO TBCR: 650.0000 mg | EXTENDED_RELEASE_TABLET | Freq: Three times a day (TID) | ORAL | Status: DC | PRN

## 2011-07-10 MED ORDER — HYDROMORPHONE HCL PF 1 MG/ML IJ SOLN
1.0000 mg | INTRAMUSCULAR | Status: DC | PRN
  Filled 2011-07-10: qty 1

## 2011-07-10 MED ORDER — ASPIRIN EC 81 MG PO TBEC
81.0000 mg | DELAYED_RELEASE_TABLET | Freq: Every day | ORAL | Status: DC
  Administered 2011-07-10 – 2011-07-14 (×5): 81 mg via ORAL
  Filled 2011-07-10 (×5): qty 1

## 2011-07-10 MED ORDER — INSULIN ASPART 100 UNIT/ML ~~LOC~~ SOLN
5.0000 [IU] | Freq: Two times a day (BID) | SUBCUTANEOUS | Status: DC
  Administered 2011-07-12 – 2011-07-14 (×4): 5 [IU] via SUBCUTANEOUS

## 2011-07-10 MED ORDER — LIDOCAINE-PRILOCAINE 2.5-2.5 % EX CREA
1.0000 "application " | TOPICAL_CREAM | CUTANEOUS | Status: DC | PRN
  Filled 2011-07-10: qty 5

## 2011-07-10 MED ORDER — INSULIN ASPART 100 UNIT/ML ~~LOC~~ SOLN
0.0000 [IU] | Freq: Three times a day (TID) | SUBCUTANEOUS | Status: DC
  Administered 2011-07-10: 2 [IU] via SUBCUTANEOUS
  Administered 2011-07-11: 13:00:00 via SUBCUTANEOUS
  Administered 2011-07-12: 2 [IU] via SUBCUTANEOUS
  Administered 2011-07-13 (×2): 1 [IU] via SUBCUTANEOUS
  Administered 2011-07-14: 2 [IU] via SUBCUTANEOUS

## 2011-07-10 MED ORDER — ONDANSETRON HCL 4 MG/2ML IJ SOLN
4.0000 mg | Freq: Once | INTRAMUSCULAR | Status: AC
  Administered 2011-07-10: 4 mg via INTRAVENOUS
  Filled 2011-07-10: qty 2

## 2011-07-10 MED ORDER — HEPARIN SODIUM (PORCINE) 1000 UNIT/ML DIALYSIS
1000.0000 [IU] | INTRAMUSCULAR | Status: DC | PRN
  Filled 2011-07-10: qty 1

## 2011-07-10 MED ORDER — POLYETHYLENE GLYCOL 3350 17 G PO PACK
17.0000 g | PACK | Freq: Every day | ORAL | Status: DC
  Administered 2011-07-11: 17 g via ORAL
  Filled 2011-07-10: qty 1

## 2011-07-10 MED ORDER — LIDOCAINE HCL (PF) 1 % IJ SOLN: 5.0000 mL | INTRAMUSCULAR | Status: DC | PRN

## 2011-07-10 MED ORDER — PROMETHAZINE HCL 25 MG/ML IJ SOLN
12.5000 mg | Freq: Four times a day (QID) | INTRAMUSCULAR | Status: DC | PRN
  Administered 2011-07-10: 12.5 mg via INTRAVENOUS
  Filled 2011-07-10: qty 1

## 2011-07-10 MED ORDER — ONDANSETRON HCL 4 MG/2ML IJ SOLN
4.0000 mg | Freq: Four times a day (QID) | INTRAMUSCULAR | Status: DC | PRN
  Administered 2011-07-13: 4 mg via INTRAVENOUS
  Filled 2011-07-10 (×2): qty 2

## 2011-07-10 MED ORDER — HYDROCODONE-ACETAMINOPHEN 5-325 MG PO TABS
1.0000 | ORAL_TABLET | ORAL | Status: DC | PRN
  Administered 2011-07-10 – 2011-07-14 (×13): 2 via ORAL
  Filled 2011-07-10 (×14): qty 2

## 2011-07-10 MED ORDER — SODIUM CHLORIDE 0.9 % IV SOLN
INTRAVENOUS | Status: DC
  Administered 2011-07-10: 07:00:00 via INTRAVENOUS

## 2011-07-10 MED ORDER — CLONIDINE HCL 0.2 MG PO TABS
0.2000 mg | ORAL_TABLET | Freq: Two times a day (BID) | ORAL | Status: DC
  Administered 2011-07-10 – 2011-07-14 (×8): 0.2 mg via ORAL
  Filled 2011-07-10 (×10): qty 1

## 2011-07-10 MED ORDER — HYDROMORPHONE HCL PF 1 MG/ML IJ SOLN
1.0000 mg | Freq: Once | INTRAMUSCULAR | Status: AC
  Administered 2011-07-10 (×2): 1 mg via INTRAVENOUS
  Filled 2011-07-10: qty 1

## 2011-07-10 MED ORDER — LOSARTAN POTASSIUM 50 MG PO TABS
100.0000 mg | ORAL_TABLET | Freq: Every day | ORAL | Status: DC
  Administered 2011-07-10: 100 mg via ORAL
  Filled 2011-07-10 (×3): qty 2

## 2011-07-10 MED ORDER — ENOXAPARIN SODIUM 30 MG/0.3ML ~~LOC~~ SOLN
30.0000 mg | Freq: Every day | SUBCUTANEOUS | Status: DC
Start: 2011-07-10 — End: 2011-07-14
  Administered 2011-07-10 – 2011-07-14 (×4): 30 mg via SUBCUTANEOUS
  Filled 2011-07-10 (×5): qty 0.3

## 2011-07-10 MED ORDER — HYDRALAZINE HCL 20 MG/ML IJ SOLN
5.0000 mg | INTRAMUSCULAR | Status: DC | PRN
  Filled 2011-07-10: qty 0.25

## 2011-07-10 MED ORDER — ISOSORBIDE MONONITRATE ER 30 MG PO TB24
30.0000 mg | ORAL_TABLET | Freq: Every day | ORAL | Status: DC
  Administered 2011-07-10 – 2011-07-14 (×5): 30 mg via ORAL
  Filled 2011-07-10 (×5): qty 1

## 2011-07-10 MED ORDER — HYDROMORPHONE HCL PF 1 MG/ML IJ SOLN: 1.0000 mg | INTRAMUSCULAR | Status: DC | PRN

## 2011-07-10 MED ORDER — METHOCARBAMOL 500 MG PO TABS
500.0000 mg | ORAL_TABLET | Freq: Three times a day (TID) | ORAL | Status: DC
  Administered 2011-07-10: 500 mg via ORAL
  Filled 2011-07-10: qty 1

## 2011-07-10 MED ORDER — CALCIUM ACETATE 667 MG PO CAPS
667.0000 mg | ORAL_CAPSULE | Freq: Three times a day (TID) | ORAL | Status: DC
  Administered 2011-07-11 – 2011-07-14 (×9): 667 mg via ORAL
  Filled 2011-07-10 (×16): qty 1

## 2011-07-10 MED ORDER — HYDROMORPHONE HCL PF 1 MG/ML IJ SOLN
INTRAMUSCULAR | Status: AC
  Administered 2011-07-10: 1 mg via INTRAVENOUS
  Filled 2011-07-10: qty 1

## 2011-07-10 MED ORDER — VITAMIN E 180 MG (400 UNIT) PO CAPS
400.0000 [IU] | ORAL_CAPSULE | Freq: Every day | ORAL | Status: DC
  Administered 2011-07-11 – 2011-07-14 (×3): 400 [IU] via ORAL
  Filled 2011-07-10 (×5): qty 1

## 2011-07-10 MED ORDER — ONDANSETRON HCL 4 MG PO TABS
4.0000 mg | ORAL_TABLET | Freq: Four times a day (QID) | ORAL | Status: DC | PRN
  Administered 2011-07-13: 4 mg via ORAL
  Filled 2011-07-10: qty 1

## 2011-07-10 MED ORDER — AMLODIPINE BESYLATE 10 MG PO TABS
10.0000 mg | ORAL_TABLET | Freq: Every day | ORAL | Status: DC
  Administered 2011-07-10 – 2011-07-14 (×5): 10 mg via ORAL
  Filled 2011-07-10 (×5): qty 1

## 2011-07-10 MED ORDER — ONDANSETRON HCL 4 MG/2ML IJ SOLN: 4.0000 mg | Freq: Four times a day (QID) | INTRAMUSCULAR | Status: DC | PRN

## 2011-07-10 MED ORDER — SIMVASTATIN 10 MG PO TABS
10.0000 mg | ORAL_TABLET | Freq: Every day | ORAL | Status: DC
  Administered 2011-07-10 – 2011-07-13 (×4): 10 mg via ORAL
  Filled 2011-07-10 (×5): qty 1

## 2011-07-10 MED ORDER — ACETAMINOPHEN 650 MG RE SUPP: 650.0000 mg | Freq: Four times a day (QID) | RECTAL | Status: DC | PRN

## 2011-07-10 MED ORDER — PENTAFLUOROPROP-TETRAFLUOROETH EX AERO: 1.0000 "application " | INHALATION_SPRAY | CUTANEOUS | Status: DC | PRN

## 2011-07-10 MED ORDER — FUROSEMIDE 40 MG PO TABS
40.0000 mg | ORAL_TABLET | Freq: Every day | ORAL | Status: DC | PRN
  Filled 2011-07-10: qty 1

## 2011-07-10 MED ORDER — VITAMIN D (ERGOCALCIFEROL) 1.25 MG (50000 UNIT) PO CAPS
50000.0000 [IU] | ORAL_CAPSULE | ORAL | Status: DC
  Administered 2011-07-11: 50000 [IU] via ORAL
  Filled 2011-07-10: qty 1

## 2011-07-10 NOTE — Progress Notes (Signed)
Utilization Review Completed.[name] T4/24/2013   

## 2011-07-10 NOTE — Consult Note (Signed)
[facility] CONSULT NOTE    Date: 07/10/2011                  Patient Name:  [name]  MRN: [id number]  DOB: 1928/12/05  Age / Sex: 78 y.o., female         PCP: No primary provider on file.                 Service Requesting Consult: Internal medicine                 Reason for Consult: Medical management ESRD            History of Present Illness: Patient is a 78 y.o. female with a PMHx of end-stage renal disease requiring hemodialysis on Mondays Wednesdays and Fridays  , who was admitted to [facility] on 07/10/2011 for evaluation of right tibio-fibular fractures      Medications: Outpatient medications: Prescriptions prior to admission  Medication Sig Dispense Refill  . acetaminophen (TYLENOL) 650 MG CR tablet Take 650 mg by mouth every 8 (eight) hours as needed. arthritis      . amLODipine (NORVASC) 10 MG tablet Take 10 mg by mouth daily.      Marland Kitchen aspirin EC 81 MG tablet Take 81 mg by mouth daily.      . B Complex-C-Folic Acid (VOL-CARE RX PO) Take 1 tablet by mouth at bedtime.      . calcium acetate (PHOSLO) 667 MG capsule Take 667 mg by mouth 3 (three) times daily with meals.      . cloNIDine (CATAPRES) 0.2 MG tablet Take 0.2 mg by mouth 2 (two) times daily.      . ergocalciferol (VITAMIN D2) 50000 UNITS capsule Take 50,000 Units by mouth once a week. Usually on Thursday      . furosemide (LASIX) 40 MG tablet Take 40 mg by mouth daily as needed. On non-dialysyis days Tuesday, Thursday, sat and sun      . hydrocodone-acetaminophen (LORCET-HD) 5-500 MG per capsule Take 1-2 capsules by mouth every 6 (six) hours as needed. pain      . insulin NPH-insulin regular (NOVOLIN 70/30) (70-30) 100 UNIT/ML injection Inject 5-10 Units into the skin 2 (two) times daily. Use 5 in the morning and 10 in the evening      . isosorbide mononitrate (IMDUR) 30 MG 24 hr tablet Take 30 mg by mouth daily.      Marland Kitchen losartan (COZAAR) 100 MG tablet Take 100 mg by mouth at bedtime and may  repeat dose one time if needed.      . simvastatin (ZOCOR) 10 MG tablet Take 10 mg by mouth at bedtime.      . vitamin E 400 UNIT capsule Take 400 Units by mouth daily.        Current medications: Current Facility-Administered Medications  Medication Dose Route Frequency Provider Last Rate Last Dose  . acetaminophen (TYLENOL) tablet 650 mg  650 mg Oral Q6H PRN Rometta Emery, MD       Or  . acetaminophen (TYLENOL) suppository 650 mg  650 mg Rectal Q6H PRN Rometta Emery, MD      . amLODipine (NORVASC) tablet 10 mg  10 mg Oral Daily Rometta Emery, MD      . aspirin EC tablet 81 mg  81 mg Oral Daily Rometta Emery, MD      . calcium acetate (PHOSLO) capsule 667 mg  667 mg Oral TID WC Mohammad L  Mikeal Hawthorne, MD      . cloNIDine (CATAPRES) tablet 0.2 mg  0.2 mg Oral BID Rometta Emery, MD      . enoxaparin (LOVENOX) injection 30 mg  30 mg Subcutaneous Daily Rometta Emery, MD      . furosemide (LASIX) tablet 40 mg  40 mg Oral Daily PRN Rometta Emery, MD      . hydrALAZINE (APRESOLINE) injection 5 mg  5 mg Intravenous Q4H PRN Caroline More, NP      . HYDROcodone-acetaminophen (NORCO) 5-325 MG per tablet 1-2 tablet  1-2 tablet Oral Q4H PRN Rometta Emery, MD      . HYDROmorphone (DILAUDID) injection 1 mg  1 mg Intravenous Once Vida Roller, MD   1 mg at 07/10/11 0340  . HYDROmorphone (DILAUDID) injection 1 mg  1 mg Intravenous Once Vida Roller, MD   1 mg at 07/10/11 0500  . HYDROmorphone (DILAUDID) injection 1 mg  1 mg Intravenous Q3H PRN Caroline More, NP      . insulin aspart (novoLOG) injection 0-9 Units  0-9 Units Subcutaneous TID WC Rometta Emery, MD   2 Units at 07/10/11 0841  . insulin aspart (novoLOG) injection 5 Units  5 Units Subcutaneous BID WC Rometta Emery, MD      . isosorbide mononitrate (IMDUR) 24 hr tablet 30 mg  30 mg Oral Daily Rometta Emery, MD      . losartan (COZAAR) tablet 100 mg  100 mg Oral QHS Rometta Emery, MD      . ondansetron (ZOFRAN)  injection 4 mg  4 mg Intravenous Once Vida Roller, MD   4 mg at 07/10/11 0500  . ondansetron (ZOFRAN) tablet 4 mg  4 mg Oral Q6H PRN Rometta Emery, MD       Or  . ondansetron (ZOFRAN) injection 4 mg  4 mg Intravenous Q6H PRN Rometta Emery, MD      . polyethylene glycol (MIRALAX / GLYCOLAX) packet 17 g  17 g Oral Daily Belkys A Regalado, MD      . simvastatin (ZOCOR) tablet 10 mg  10 mg Oral QHS Rometta Emery, MD      . Vitamin D (Ergocalciferol) (DRISDOL) capsule 50,000 Units  50,000 Units Oral Weekly Rometta Emery, MD      . vitamin E capsule 400 Units  400 Units Oral Daily Rometta Emery, MD      . DISCONTD: 0.9 %  sodium chloride infusion   Intravenous Continuous Rometta Emery, MD 100 mL/hr at 07/10/11 0636    . DISCONTD: acetaminophen (TYLENOL) CR tablet 650 mg  650 mg Oral Q8H PRN Rometta Emery, MD      . DISCONTD: HYDROmorphone (DILAUDID) injection 1 mg  1 mg Intravenous Q4H PRN Rometta Emery, MD      . DISCONTD: methocarbamol (ROBAXIN) tablet 500 mg  500 mg Oral TID Jamelle Rushing, PA   500 mg at 07/10/11 0843  . DISCONTD: ondansetron (ZOFRAN) injection 4 mg  4 mg Intravenous Q6H PRN Belkys A Regalado, MD      . DISCONTD: promethazine (PHENERGAN) injection 12.5 mg  12.5 mg Intravenous Q6H PRN Caroline More, NP   12.5 mg at 07/10/11 0731      Allergies: Allergies  Allergen Reactions  . Codeine       Past Medical History: Past Medical History  Diagnosis Date  . CHF (congestive heart failure)   .  Renal insufficiency   . Fracture 07/09/2011     Past Surgical History: History reviewed. No pertinent past surgical history.   Family History: History reviewed. No pertinent family history.   Social History: History   Social History  . Marital Status: Single    Spouse Name: N/A    Number of Children: N/A  . Years of Education: N/A   Occupational History  . Not on file.   Social History Main Topics  . Smoking status: Not on file  . Smokeless  tobacco: Not on file  . Alcohol Use: No  . Drug Use:   . Sexually Active:    Other Topics Concern  . Not on file   Social History Narrative  . No narrative on file     Review of Systems: Eyes no visual loss ENT no sinus sore throat CVS no chest pain syncope or SOB RS no cough or sputum AS no pain or Diarrhea  UG ESRD Heme no DVT PE Musculoskeletal: Positive for myalgias, joint pain and falls.  Neuro no Headahe stole or seizures Endo no Dm or thyroid disease    Vital Signs: Blood pressure 199/74, pulse 88, temperature 97.5 F (36.4 C), temperature source Oral, resp. rate 20, SpO2 92.00%.  Weight trends: There were no vitals filed for this visit.  Physical Exam: General: Vital signs reviewed and noted. Well-developed, well-nourished, in no acute distress; alert, appropriate and cooperative throughout examination.  Head: Normocephalic, atraumatic.  Eyes: PERRL, EOMI, No signs of anemia or jaundince.  Nose: Mucous membranes moist, not inflammed, nonerythematous.  Throat: Oropharynx nonerythematous, no exudate appreciated.   Neck: No deformities, masses, or tenderness noted.Supple, No carotid Bruits, no JVD.  Lungs:  Normal respiratory effort. Clear to auscultation BL without crackles or wheezes.  Heart: RRR. S1 and S2 normal without gallop, murmur, or rubs.  Abdomen:  BS normoactive. Soft, Nondistended, non-tender.  No masses or organomegaly.  Extremities: No pretibial edema.  Neurologic: A&O X3, CN II - XII are grossly intact. Motor strength is 5/5 in the all 4 extremities, Sensations intact to light touch, Cerebellar signs negative.  Skin: No visible rashes, scars.    Lab results: Basic Metabolic Panel:  Lab 07/10/11 1478  NA 142  K 4.8  CL 98  CO2 27  GLUCOSE 176*  BUN 32*  CREATININE 8.04*  CALCIUM 9.4  MG --  PHOS --    Liver Function Tests: No results found for this basename: AST:3,ALT:3,ALKPHOS:3,BILITOT:3,PROT:3,ALBUMIN:3 in the last 168 hours No  results found for this basename: LIPASE:3,AMYLASE:3 in the last 168 hours No results found for this basename: AMMONIA:3 in the last 168 hours  CBC:  Lab 07/10/11 0317  WBC 10.2  NEUTROABS --  HGB 11.0*  HCT 34.6*  MCV 101.2*  PLT 235    Cardiac Enzymes: No results found for this basename: CKTOTAL:5,CKMB:5,CKMBINDEX:5,TROPONINI:5 in the last 168 hours  BNP: No components found with this basename: POCBNP:3  CBG:  Lab 07/10/11 0644  GLUCAP 151*    Microbiology: Results for orders placed during the hospital encounter of 07/14/10  MRSA PCR SCREENING     Status: Normal   Collection Time   07/14/10  9:00 PM      Component Value Range Status Comment   MRSA by PCR    NEGATIVE  Final    Value: NEGATIVE            The GeneXpert MRSA Assay (FDA     approved for NASAL specimens     only),  is one component of a     comprehensive MRSA colonization     surveillance program. It is not     intended to diagnose MRSA     infection nor to guide or     monitor treatment for     MRSA infections.    Coagulation Studies: No results found for this basename: LABPROT:3,INR:3 in the last 72 hours  Urinalysis: No results found for this basename: COLORURINE:2,APPERANCEUR:2,LABSPEC:2,PHURINE:2,GLUCOSEU:2,HGBUR:2,BILIRUBINUR:2,KETONESUR:2,PROTEINUR:2,UROBILINOGEN:2,NITRITE:2,LEUKOCYTESUR:2 in the last 72 hours    Imaging: Dg Chest 2 View  07/10/2011  *RADIOLOGY REPORT*  Clinical Data: Left tib-fib fracture for preop.  CHEST - 2 VIEW  Comparison: 07/16/2010  Findings: Borderline heart size with normal pulmonary vascularity. No focal airspace consolidation in the lungs.  No blunting of costophrenic angles.  No pneumothorax.  Postoperative changes in the right shoulder.  Calcification and tortuosity of the aorta.  IMPRESSION: No evidence of active pulmonary disease.  Original Report Authenticated By: Marlon Pel, M.D.   Dg Tibia/fibula Right  07/10/2011  *RADIOLOGY REPORT*  Clinical Data:  Severe pain after fall yesterday.  RIGHT TIBIA AND FIBULA - 2 VIEW  Comparison: None.  Findings: Acute appearing spiral oblique fracture of the distal right tibial shaft with posterior lateral displacement the distal fracture fragments.  Soft tissue swelling.  Postoperative change with plate screw fixation of the distal fibula.  Oblique fracture of the proximal fibular shaft with anterior and medial displacement of the distal fracture fragments.  No focal bone lesion or bone destruction.  The calcaneus appears to be short and configuration although the trabecular architecture appears intact.  This could be due to positioning or old trauma.  If the patient is having pain in the region of the calcaneus, calcaneal views would be useful for further evaluation.  IMPRESSION: Acute fractures of the proximal fibula and distal tibia. Postoperative changes in the distal fibula.  Original Report Authenticated By: Marlon Pel, M.D.      Assessment & Plan: Pt is a 78 y.o. yo female with a PMHX of ESRD was admitted to [facility] on 07/10/2011 with fractured tibia.   1.ESRD  Dialysis MWF 2. HTN/Vol looks pretty controlled remove 2-3 L 3.Anemia Hb above 11 4. Bones will check outpatient records 5. Pain Avoid over sedation   Very nice 78 year old ESRD with Right AVG followed in [place] plan dialysis today

## 2011-07-10 NOTE — ED Notes (Signed)
Pt was seen at [facility] in [place] today

## 2011-07-10 NOTE — H&P (Signed)
[name] is an 78 y.o. female.   Chief Complaint: Leg pain HPI: An AP alcohol pleasant woman from [facility] who had a mechanical fall at home and sustained a right tibio-fibular fractures yesterday. She went to [facility] in [place] was immobilized sent home to follow up with orthopedic . Patient lives alone since her husband died she was unable to walk or take care of herself at home. Her daughter that lives in [place] went and picked her up off and brought her over here. She is still having severe pain rated as 7/10 which has improved now with IV morphine. He has other medical problems including end-stage renal disease requiring hemodialysis on Mondays Wednesdays and Fridays type 2 diabetes hyperlipidemia as well as anemia of chronic disease. Orthopedic surgeon has been consulted here but due to her multiple medical problems she is being admitted to medicine service.  Past Medical History  Diagnosis Date  . CHF (congestive heart failure)   . Renal insufficiency   . Fracture 07/09/2011    History reviewed. No pertinent past surgical history.  History reviewed. No pertinent family history. Social History:  does not have a smoking history on file. She does not have any smokeless tobacco history on file. She reports that she does not drink alcohol. Her drug history not on file.  Allergies:  Allergies  Allergen Reactions  . Codeine      (Not in a hospital admission)  Results for orders placed during the hospital encounter of 07/10/11 (from the past 48 hour(s))  BASIC METABOLIC PANEL     Status: Abnormal   Collection Time   07/10/11  3:17 AM      Component Value Range Comment   Sodium 142  135 - 145 (mEq/L)    Potassium 4.8  3.5 - 5.1 (mEq/L)    Chloride 98  96 - 112 (mEq/L)    CO2 27  19 - 32 (mEq/L)    Glucose, Bld 176 (*) 70 - 99 (mg/dL)    BUN 32 (*) 6 - 23 (mg/dL)    Creatinine, Ser 1.61 (*) 0.50 - 1.10 (mg/dL)    Calcium 9.4  8.4 - 10.5  (mg/dL)    GFR calc non Af Amer 4 (*) >90 (mL/min)    GFR calc Af Amer 5 (*) >90 (mL/min)   CBC     Status: Abnormal   Collection Time   07/10/11  3:17 AM      Component Value Range Comment   WBC 10.2  4.0 - 10.5 (K/uL)    RBC 3.42 (*) 3.87 - 5.11 (MIL/uL)    Hemoglobin 11.0 (*) 12.0 - 15.0 (g/dL)    HCT 09.6 (*) 04.5 - 46.0 (%)    MCV 101.2 (*) 78.0 - 100.0 (fL)    MCH 32.2  26.0 - 34.0 (pg)    MCHC 31.8  30.0 - 36.0 (g/dL)    RDW 40.9  81.1 - 91.4 (%)    Platelets 235  150 - 400 (K/uL)    Dg Chest 2 View  07/10/2011  *RADIOLOGY REPORT*  Clinical Data: Left tib-fib fracture for preop.  CHEST - 2 VIEW  Comparison: 07/16/2010  Findings: Borderline heart size with normal pulmonary vascularity. No focal airspace consolidation in the lungs.  No blunting of costophrenic angles.  No pneumothorax.  Postoperative changes in the right shoulder.  Calcification and tortuosity of the aorta.  IMPRESSION: No evidence of active pulmonary disease.  Original Report Authenticated By: Marlon Pel, M.D.  Dg Tibia/fibula Right  07/10/2011  *RADIOLOGY REPORT*  Clinical Data: Severe pain after fall yesterday.  RIGHT TIBIA AND FIBULA - 2 VIEW  Comparison: None.  Findings: Acute appearing spiral oblique fracture of the distal right tibial shaft with posterior lateral displacement the distal fracture fragments.  Soft tissue swelling.  Postoperative change with plate screw fixation of the distal fibula.  Oblique fracture of the proximal fibular shaft with anterior and medial displacement of the distal fracture fragments.  No focal bone lesion or bone destruction.  The calcaneus appears to be short and configuration although the trabecular architecture appears intact.  This could be due to positioning or old trauma.  If the patient is having pain in the region of the calcaneus, calcaneal views would be useful for further evaluation.  IMPRESSION: Acute fractures of the proximal fibula and distal tibia. Postoperative  changes in the distal fibula.  Original Report Authenticated By: Marlon Pel, M.D.    Review of Systems  Musculoskeletal: Positive for myalgias, joint pain and falls.  All other systems reviewed and are negative.    Blood pressure 187/69, pulse 77, temperature 97 F (36.1 C), temperature source Oral, resp. rate 18, SpO2 94.00%. Physical Exam  Constitutional: She is oriented to person, place, and time. She appears well-developed and well-nourished.  HENT:  Head: Normocephalic and atraumatic.  Right Ear: External ear normal.  Left Ear: External ear normal.  Nose: Nose normal.  Mouth/Throat: Oropharynx is clear and moist.  Eyes: Conjunctivae and EOM are normal. Pupils are equal, round, and reactive to light.  Neck: Normal range of motion. Neck supple.  Cardiovascular: Normal rate, regular rhythm, normal heart sounds and intact distal pulses.   Respiratory: Effort normal and breath sounds normal.  GI: Soft. Bowel sounds are normal.  Musculoskeletal:       Legs: Neurological: She is alert and oriented to person, place, and time. She has normal reflexes.  Skin: Skin is warm and dry.  Psychiatric: She has a normal mood and affect. Her behavior is normal. Judgment and thought content normal.     Assessment/Plan Assessment this is an 78 year old female presenting with right lower extremity fractures in the setting of end-stage renal disease multiple other medical problems. #1 Tibia and fibula fractures: Orthopedic surgeon who will see patient later this morning. In the meantime we'll admit the patient pain control keep on bedrest on told season but also on file the decision regarding surgery will be done by Ortho. Patient is stable on has no cardiac reasons not to have surgery if planned by ortho. Consult PT and OT. #2 end-stage renal disease: Nephrology will be consulted for hemodialysis today. Her electrolytes look stable #3 diabetes: Continue sliding scale insulin and her home med  therapy #4 hypertension: Continue his home medications also #5 anemia of chronic disease: From end-stage renal disease. Continue per nephrology #6 hyperlipidemia: Continue home therapy  Jaylea Plourde,LAWAL 07/10/2011, 5:38 AM

## 2011-07-10 NOTE — ED Notes (Signed)
Pt reports tripping and falling yesterday while at home, pt injured her LLE - was seen and treated at hospital in [place]- pt d/'d w/ tib/fib fxs and post leg splint applied. Pt presents tonight w/ increased and uncontrolled pain. Pt currently staying w/ her daughter while recovering.

## 2011-07-10 NOTE — Progress Notes (Signed)
Agree with OT note.  MD wants pt on STRICT BEDREST until right LE cast applied.  Pt is to elevate right LE today with ice applied.  RN made aware.  07/10/2011 Cephus Shelling, PT, DPT [phone]

## 2011-07-10 NOTE — Telephone Encounter (Signed)
[name] is the mother of one of your patients, [name] ([name]).  She is in [facility] at this time and will be moving in with MS. [name].  She has Medicare.  Could we work her in as a New patient in the next several weeks?

## 2011-07-10 NOTE — Progress Notes (Signed)
Subjective: Patient awake, oriented to place and person. Relates pain is ok.  Denies dyspnea, or chest pain.  Objective: Filed Vitals:   07/10/11 0209 07/10/11 0412 07/10/11 0601 07/10/11 0652  BP: 198/97 187/69 187/62 199/74  Pulse: 78 77 82 88  Temp: 98.3 F (36.8 C) 97 F (36.1 C) 97.5 F (36.4 C) 97.5 F (36.4 C)  TempSrc: Oral Oral Oral   Resp: 16 18 18 20   SpO2: 100% 94% 97% 92%   Weight change:  No intake or output data in the 24 hours ending 07/10/11 0915  General: Alert, awake, , in no acute distress.  HEENT: No bruits, no goiter.  Heart: Regular rate and rhythm, without murmurs, rubs, gallops.  Lungs: CTA, bilateral air movement.  Abdomen: Soft, nontender, nondistended, positive bowel sounds.  Extremities: no edema. Right with dressing.    Lab Results:  [facility] 07/10/11 0317  NA 142  K 4.8  CL 98  CO2 27  GLUCOSE 176*  BUN 32*  CREATININE 8.04*  CALCIUM 9.4  MG --  PHOS --   Basename 07/10/11 0317  WBC 10.2  NEUTROABS --  HGB 11.0*  HCT 34.6*  MCV 101.2*  PLT 235    Micro Results: No results found for this or any previous visit (from the past 240 hour(s)).  Studies/Results: Dg Chest 2 View  07/10/2011  *RADIOLOGY REPORT*  Clinical Data: Left tib-fib fracture for preop.  CHEST - 2 VIEW  Comparison: 07/16/2010  Findings: Borderline heart size with normal pulmonary vascularity. No focal airspace consolidation in the lungs.  No blunting of costophrenic angles.  No pneumothorax.  Postoperative changes in the right shoulder.  Calcification and tortuosity of the aorta.  IMPRESSION: No evidence of active pulmonary disease.  Original Report Authenticated By: Marlon Pel, M.D.   Dg Tibia/fibula Right  07/10/2011  *RADIOLOGY REPORT*  Clinical Data: Severe pain after fall yesterday.  RIGHT TIBIA AND FIBULA - 2 VIEW  Comparison: None.  Findings: Acute appearing spiral oblique fracture of the distal right tibial shaft with posterior lateral displacement  the distal fracture fragments.  Soft tissue swelling.  Postoperative change with plate screw fixation of the distal fibula.  Oblique fracture of the proximal fibular shaft with anterior and medial displacement of the distal fracture fragments.  No focal bone lesion or bone destruction.  The calcaneus appears to be short and configuration although the trabecular architecture appears intact.  This could be due to positioning or old trauma.  If the patient is having pain in the region of the calcaneus, calcaneal views would be useful for further evaluation.  IMPRESSION: Acute fractures of the proximal fibula and distal tibia. Postoperative changes in the distal fibula.  Original Report Authenticated By: Marlon Pel, M.D.    Medications: I have reviewed the patient's current medications.   Patient Active Hospital Problem List:  Tibia/fibula fracture (07/10/2011) Ortho recommend conservative management.  Patient will have cast today. I will discontinue methocarbamol to avoid over sedation. I will add Miralax to avoid constipation.  PT, OT per rotho. Incentive spirometry at bedside.   ESRD (end stage renal disease) (07/10/2011)  NSL. Renal Consulted. Patients on dialysis, M, W, F.  HTN (hypertension) (07/10/2011) Continue with Norvasc, Clonidine, Cozaar.  DM2 (diabetes mellitus, type 2) (07/10/2011) Continue with insulin.  Hyperlipidemia (07/10/2011)  Continue with Simvastatin.  Anemia (07/10/2011) Probably chronic diseases     LOS: 0 days   Novak Stgermaine M.D.  [facility] 07/10/2011, 9:15 AM

## 2011-07-10 NOTE — ED Provider Notes (Signed)
History     CSN: [id number]  Arrival date & time 07/10/11  0151   First MD Initiated Contact with Patient 07/10/11 0300      Chief Complaint  Patient presents with  . Leg Pain    (Consider location/radiation/quality/duration/timing/severity/associated sxs/prior treatment) HPI Comments: 78 year old female with a history of kidney failure on dialysis, diabetes, hypertension and high cholesterol who presents with fracture of the right lower extremity.  She was placed in a splint at an outside hospital where they identified what she reports to be a distal tibia and proximal fibular fracture. She was given hydrocodone for pain but states that over the last several hours since this happened her pain has been persistent and has not improved significantly. She denies any numbness or tingling of the foot. She denies any other injuries. Her primary residence is in [facility] or the injury happened, she came to this city this afternoon and evening to live with her daughter temporarily while she had this injury. She has not yet been seen by orthopedics per her report.  The pain is constant, moderate, worse with palpation. She is unable to ambulate and was unable to ambulate with crutches thus was discharged without crutches.  She denies fevers chills nausea vomiting shortness of breath chest pain back pain belly pain rash. She does admit to having some swelling of the right lower extremity  Patient is a 77 y.o. female presenting with leg pain. The history is provided by the patient and a relative.  Leg Pain     Past Medical History  Diagnosis Date  . CHF (congestive heart failure)   . Renal insufficiency   . Fracture 07/09/2011    History reviewed. No pertinent past surgical history.  No family history on file.  History  Substance Use Topics  . Smoking status: Not on file  . Smokeless tobacco: Not on file  . Alcohol Use: No    OB History    Grav Para Term Preterm  Abortions TAB SAB Ect Mult Living                  Review of Systems  All other systems reviewed and are negative.    Allergies  Codeine  Home Medications   Current Outpatient Rx  Name Route Sig Dispense Refill  . ACETAMINOPHEN ER 650 MG PO TBCR Oral Take 650 mg by mouth every 8 (eight) hours as needed. arthritis    . AMLODIPINE BESYLATE 10 MG PO TABS Oral Take 10 mg by mouth daily.    . ASPIRIN EC 81 MG PO TBEC Oral Take 81 mg by mouth daily.    Marland Kitchen VOL-CARE RX PO Oral Take 1 tablet by mouth at bedtime.    Marland Kitchen CALCIUM ACETATE 667 MG PO CAPS Oral Take 667 mg by mouth 3 (three) times daily with meals.    Marland Kitchen CLONIDINE HCL 0.2 MG PO TABS Oral Take 0.2 mg by mouth 2 (two) times daily.    . ERGOCALCIFEROL 50000 UNITS PO CAPS Oral Take 50,000 Units by mouth once a week. Usually on Thursday    . FUROSEMIDE 40 MG PO TABS Oral Take 40 mg by mouth daily as needed. On non-dialysyis days Tuesday, Thursday, sat and sun    . HYDROCODONE-ACETAMINOPHEN 5-500 MG PO CAPS Oral Take 1-2 capsules by mouth every 6 (six) hours as needed. pain    . INSULIN ISOPHANE & REGULAR (70-30) 100 UNIT/ML [place] SUSP Subcutaneous Inject 5-10 Units into the skin 2 (two) times  daily. Use 5 in the morning and 10 in the evening    . ISOSORBIDE MONONITRATE ER 30 MG PO TB24 Oral Take 30 mg by mouth daily.    Marland Kitchen LOSARTAN POTASSIUM 100 MG PO TABS Oral Take 100 mg by mouth at bedtime and may repeat dose one time if needed.    Marland Kitchen SIMVASTATIN 10 MG PO TABS Oral Take 10 mg by mouth at bedtime.    Marland Kitchen VITAMIN E 400 UNITS PO CAPS Oral Take 400 Units by mouth daily.      BP 187/69  Pulse 77  Temp(Src) 97 F (36.1 C) (Oral)  Resp 18  SpO2 94%  Physical Exam  Nursing note and vitals reviewed. Constitutional: She appears well-developed and well-nourished. No distress.  HENT:  Head: Normocephalic and atraumatic.  Mouth/Throat: Oropharynx is clear and moist. No oropharyngeal exudate.  Eyes: Conjunctivae and EOM are normal. Pupils are  equal, round, and reactive to light. Right eye exhibits no discharge. Left eye exhibits no discharge. No scleral icterus.  Neck: Normal range of motion. Neck supple. No JVD present. No thyromegaly present.  Cardiovascular: Normal rate, regular rhythm, normal heart sounds and intact distal pulses.  Exam reveals no gallop and no friction rub.   No murmur heard. Pulmonary/Chest: Effort normal and breath sounds normal. No respiratory distress. She has no wheezes. She has no rales.  Abdominal: Soft. Bowel sounds are normal. She exhibits no distension and no mass. There is no tenderness.  Musculoskeletal: She exhibits tenderness. She exhibits no edema.       Decreased range of motion at the right ankle secondary to pain and swelling, tenderness over the medial malleolus, tenderness along the course of the tibia worsening as you go distally. Mild tenderness over the proximal fibula. No significant bruising or swelling of the proximal leg. Palpable pulses of the right foot, normal capillary refill of the right toes  Lymphadenopathy:    She has no cervical adenopathy.  Neurological: She is alert. Coordination normal.       Normal sensation of the right foot  Skin: Skin is warm and dry. No rash noted. No erythema.  Psychiatric: She has a normal mood and affect. Her behavior is normal.    ED Course  Procedures (including critical care time)  ED ECG REPORT   Date: 07/10/2011   Rate: 90  Rhythm: normal sinus rhythm  QRS Axis: normal  Intervals: normal  ST/T Wave abnormalities: normal  Conduction Disutrbances:none  Narrative Interpretation: PACs present  Old EKG Reviewed: none available   Labs Reviewed  BASIC METABOLIC PANEL - Abnormal; Notable for the following:    Glucose, Bld 176 (*)    BUN 32 (*)    Creatinine, Ser 8.04 (*)    GFR calc non Af Amer 4 (*)    GFR calc Af Amer 5 (*)    All other components within normal limits  CBC - Abnormal; Notable for the following:    RBC 3.42 (*)     Hemoglobin 11.0 (*)    HCT 34.6 (*)    MCV 101.2 (*)    All other components within normal limits   Dg Tibia/fibula Right  07/10/2011  *RADIOLOGY REPORT*  Clinical Data: Severe pain after fall yesterday.  RIGHT TIBIA AND FIBULA - 2 VIEW  Comparison: None.  Findings: Acute appearing spiral oblique fracture of the distal right tibial shaft with posterior lateral displacement the distal fracture fragments.  Soft tissue swelling.  Postoperative change with plate screw fixation of the  distal fibula.  Oblique fracture of the proximal fibular shaft with anterior and medial displacement of the distal fracture fragments.  No focal bone lesion or bone destruction.  The calcaneus appears to be short and configuration although the trabecular architecture appears intact.  This could be due to positioning or old trauma.  If the patient is having pain in the region of the calcaneus, calcaneal views would be useful for further evaluation.  IMPRESSION: Acute fractures of the proximal fibula and distal tibia. Postoperative changes in the distal fibula.  Original Report Authenticated By: Marlon Pel, M.D.     1. Closed fracture of right distal tibia   2. Closed fracture of fibula       MDM  Closed. Fracture of the right lower extremity, repeat imaging, check labs, patient is hypertensive and is a dialysis patient. She has had poor pain control, will give intravenous hydromorphone, check labs, repeat x-rays.  Dr. Nilsa Nutting group requested, Hospitalist requested.  To get dialysis today.  [facility] nephrology.  Daughter is case Financial controller at [employer].  Fractures identified on x-ray, distal spiral fracture of the distal tibia and proximal fracture of the fibula. Patient has had some improvement with hydromorphone but still has significant pain.  Patient reevaluated at 4:30 AM, x-rays reviewed, compartments are soft, patient has had very good pain control with hydromorphone.  Laboratory workup shows normal blood counts,  elevated creatinine, this is expected given her renal failure, normal electrolytes.  Dr. Thomasena Edis with orthopedic surgery has been consult and will see the patient this morning, hospitalist paged for admission.  Dr. Mikeal Hawthorne has returned page and will admit.  Vida Roller, MD 07/10/11 [phone]

## 2011-07-10 NOTE — Progress Notes (Signed)
Clinical Social Work  CSW met with dtr while patient out of the room. CSW explained SCAT application and completed part B of application. Dtr agreeable to patient completing part A and will fax application to SCAT. CSW will follow up with patient at a later time.  [place], [place] [phone]

## 2011-07-10 NOTE — ED Notes (Signed)
Per pt discharge instructions from [facility] pt was seen after mechanical fall for pain in right leg and diagnosed with a distal right tibial fracture and proximal right fibula fracture.  Pt was sent home with pain meds, in splint and with directions to follow up with orthopedics in 3 days.  Nursing secretary is working on getting pt medical records from ER visit at this time

## 2011-07-10 NOTE — Progress Notes (Signed)
PT/OT Cancellation Note:  Evaluation cancelled today due to pt. on bedrest and spoke with MD who requests evaluations be completed after LE cast is completed.Marland Kitchen  Allie Ousley, OTR/L Pager [phone] 07/10/2011, 11:36 AM

## 2011-07-10 NOTE — Telephone Encounter (Signed)
ok 

## 2011-07-10 NOTE — ED Notes (Signed)
Patient transported to X-ray 

## 2011-07-10 NOTE — Consult Note (Signed)
Reason for Consult:FX Leg Referring Physician: ED MD  [name] is an 78 y.o. female.  HPI: 9 yoa female at home yesterday in [place] when got foot caught on carpet tripping and injuring rt ankle. Went to ER eval and place in splint and told to F/U with ortho and make an appointment. Daughter went and got pt ,brought to [facility] ER for eval and placement due pt unable to care for self at home.  Past Medical History  Diagnosis Date  . CHF (congestive heart failure)   . Renal insufficiency   . Fracture 07/09/2011    History reviewed. No pertinent past surgical history.  History reviewed. No pertinent family history.  Social History:  does not have a smoking history on file. She does not have any smokeless tobacco history on file. She reports that she does not drink alcohol. Her drug history not on file.  Allergies:  Allergies  Allergen Reactions  . Codeine     Medications: I have reviewed the patient's current medications.  Results for orders placed during the hospital encounter of 07/10/11 (from the past 48 hour(s))  BASIC METABOLIC PANEL     Status: Abnormal   Collection Time   07/10/11  3:17 AM      Component Value Range Comment   Sodium 142  135 - 145 (mEq/L)    Potassium 4.8  3.5 - 5.1 (mEq/L)    Chloride 98  96 - 112 (mEq/L)    CO2 27  19 - 32 (mEq/L)    Glucose, Bld 176 (*) 70 - 99 (mg/dL)    BUN 32 (*) 6 - 23 (mg/dL)    Creatinine, Ser 7.82 (*) 0.50 - 1.10 (mg/dL)    Calcium 9.4  8.4 - 10.5 (mg/dL)    GFR calc non Af Amer 4 (*) >90 (mL/min)    GFR calc Af Amer 5 (*) >90 (mL/min)   CBC     Status: Abnormal   Collection Time   07/10/11  3:17 AM      Component Value Range Comment   WBC 10.2  4.0 - 10.5 (K/uL)    RBC 3.42 (*) 3.87 - 5.11 (MIL/uL)    Hemoglobin 11.0 (*) 12.0 - 15.0 (g/dL)    HCT 95.6 (*) 21.3 - 46.0 (%)    MCV 101.2 (*) 78.0 - 100.0 (fL)    MCH 32.2  26.0 - 34.0 (pg)    MCHC 31.8  30.0 - 36.0 (g/dL)    RDW 08.6  57.8 - 46.9 (%)    Platelets 235  150 - 400 (K/uL)   GLUCOSE, CAPILLARY     Status: Abnormal   Collection Time   07/10/11  6:44 AM      Component Value Range Comment   Glucose-Capillary 151 (*) 70 - 99 (mg/dL)     Dg Chest 2 View  09/14/5282  *RADIOLOGY REPORT*  Clinical Data: Left tib-fib fracture for preop.  CHEST - 2 VIEW  Comparison: 07/16/2010  Findings: Borderline heart size with normal pulmonary vascularity. No focal airspace consolidation in the lungs.  No blunting of costophrenic angles.  No pneumothorax.  Postoperative changes in the right shoulder.  Calcification and tortuosity of the aorta.  IMPRESSION: No evidence of active pulmonary disease.  Original Report Authenticated By: Marlon Pel, M.D.   Dg Tibia/fibula Right  07/10/2011  *RADIOLOGY REPORT*  Clinical Data: Severe pain after fall yesterday.  RIGHT TIBIA AND FIBULA - 2 VIEW  Comparison: None.  Findings: Acute appearing spiral  oblique fracture of the distal right tibial shaft with posterior lateral displacement the distal fracture fragments.  Soft tissue swelling.  Postoperative change with plate screw fixation of the distal fibula.  Oblique fracture of the proximal fibular shaft with anterior and medial displacement of the distal fracture fragments.  No focal bone lesion or bone destruction.  The calcaneus appears to be short and configuration although the trabecular architecture appears intact.  This could be due to positioning or old trauma.  If the patient is having pain in the region of the calcaneus, calcaneal views would be useful for further evaluation.  IMPRESSION: Acute fractures of the proximal fibula and distal tibia. Postoperative changes in the distal fibula.  Original Report Authenticated By: Marlon Pel, M.D.    Review of Systems  HENT: Negative.   Respiratory: Negative.   Cardiovascular: Negative.   Gastrointestinal: Negative.   Genitourinary: Negative.   Musculoskeletal: Positive for joint pain.       Right lower  leg pain from fall   Skin: Negative.   Neurological: Positive for weakness.   Blood pressure 199/74, pulse 88, temperature 97.5 F (36.4 C), temperature source Oral, resp. rate 20, SpO2 92.00%. Physical Exam Pt cons alert and approp. No distress. Right lower leg in well padded splint from foot to below knee. Toes good sense,motor, cap refill. Left leg no deformities or pain, foot good pulse and sense.  Assessment/Plan: Right slight displaced distal tibia fx with prox 1/3 ND fib fx, with h/o previous distal fib orif. Medical H/O renal disease with dialysis, DM, HTN  Plan Xrays reviewed by Dr Thomasena Edis and his associates and plan for non surgical treatment in cast. Will cast either later today or tomorrow. With possible surgery later if needed. Pt will be Non-weightbearing for 8-12 weeks at least on the right. Discussed with the patient and she had no current questions. Will make contact with daughter and discuss with her. [name] 07/10/2011, 7:17 AM

## 2011-07-10 NOTE — Progress Notes (Signed)
Clinical Social Work Department BRIEF PSYCHOSOCIAL ASSESSMENT 07/10/2011  Patient:  [name], [name]     Account Number:  [id number]     Admit date:  07/10/2011  Clinical Social Worker:  Dennison Bulla  Date/Time:  07/10/2011 11:45 AM  Referred by:  Physician  Date Referred:  07/10/2011 Referred for  Other - See comment  Transportation assistance   Other Referral:   Outpatient dialysis   Interview type:  Family Other interview type:   [name]-dtr    PSYCHOSOCIAL DATA Living Status:  ALONE Admitted from facility:   Level of care:   Primary support name:  [name] Primary support relationship to patient:  CHILD, ADULT Degree of support available:   Strong    CURRENT CONCERNS Current Concerns  Other - See comment   Other Concerns:   Transportation, dialysis    SOCIAL WORK ASSESSMENT / PLAN CSW received referral from MD to assist with post acute planning. CSW attempted to meet with patient but patient was sleeping and unresponsive to verbal stimuli. CSW called patient's dtr ([name]) and spoke about dc plans. Dtr refuses SNF and states that patient will live with her. Per dtr, she needs assistance with dialysis set up in [place] and transportation. Dtr desires outpatient dialysis at [facility] or [place] location on M,W, F in the mornings. CSW called Darel Hong (from dialysis) and she is agreeable to setting up patient. CSW gave Darel Hong dtr's number in case she needs additional information. CSW spoke with dtr regarding SCAT application and dtr is agreeable. CSW will follow up.   Assessment/plan status:  Psychosocial Support/Ongoing Assessment of Needs Other assessment/ plan:   Information/referral to community resources:   Dialysis referral to [facility]    PATIENT'S/FAMILY'S RESPONSE TO PLAN OF CARE: Patient did not participate in assessment. Dtr does not want SNF. Dtr appreciative of call and agreeable to follow up with CSW for SCAT application.

## 2011-07-10 NOTE — ED Notes (Signed)
Pt reports that she fell earlier today and that she was seen in [place] and that her leg is broken.  Pt points to tib-fib area as to where the fracture is.  Pt states that she is here due to pain being too bad.  Pt reports that she had prescriptions filled and took pain medications without relief.  Pt has a splint in place, appears in pain

## 2011-07-11 LAB — CBC
HCT: 34.3 % — ABNORMAL LOW (ref 36.0–46.0)
Hemoglobin: 10.9 g/dL — ABNORMAL LOW (ref 12.0–15.0)
MCH: 32.2 pg (ref 26.0–34.0)
MCHC: 31.8 g/dL (ref 30.0–36.0)

## 2011-07-11 LAB — GLUCOSE, CAPILLARY
Glucose-Capillary: 87 mg/dL (ref 70–99)
Glucose-Capillary: 165 mg/dL — ABNORMAL HIGH (ref 70–99)
Glucose-Capillary: 168 mg/dL — ABNORMAL HIGH (ref 70–99)

## 2011-07-11 LAB — BASIC METABOLIC PANEL
BUN: 22 mg/dL (ref 6–23)
Chloride: 96 mEq/L (ref 96–112)
GFR calc Af Amer: 7 mL/min — ABNORMAL LOW (ref >90)
Glucose, Bld: 92 mg/dL (ref 70–99)
Potassium: 5.7 mEq/L — ABNORMAL HIGH (ref 3.5–5.1)

## 2011-07-11 MED ORDER — SODIUM POLYSTYRENE SULFONATE 15 GM/60ML PO SUSP
15.0000 g | ORAL | Status: AC
  Administered 2011-07-11: 15 g via ORAL
  Filled 2011-07-11: qty 60

## 2011-07-11 MED ORDER — POLYETHYLENE GLYCOL 3350 17 G PO PACK
17.0000 g | PACK | Freq: Two times a day (BID) | ORAL | Status: DC
  Administered 2011-07-12 – 2011-07-13 (×4): 17 g via ORAL
  Filled 2011-07-11 (×8): qty 1

## 2011-07-11 MED ORDER — NEPRO/CARBSTEADY PO LIQD
237.0000 mL | Freq: Every day | ORAL | Status: DC
  Filled 2011-07-11 (×5): qty 237

## 2011-07-11 NOTE — Progress Notes (Signed)
Subjective: Very pleadant lady and cooperative. I discussed the fracture and treatment with patient and her daughter via phone. I believe this fracture would best be managed in cast versus surgery. I also reviewed with 2 of my partners and they both concur.  Objective: Vital signs in last 24 hours: Temp:  [97.3 F (36.3 C)-100 F (37.8 C)] 97.8 F (36.6 C) (04/25 1300) Pulse Rate:  [65-91] 66  (04/25 1300) Resp:  [18-20] 18  (04/25 1300) BP: (105-177)/(42-72) 177/71 mmHg (04/25 1300) SpO2:  [94 %-100 %] 94 % (04/25 1300) Weight:  [65.5 kg (144 lb 6.4 oz)-65.6 kg (144 lb 10 oz)] 65.6 kg (144 lb 10 oz) (04/24 1828)  Intake/Output from previous day: 04/24 0701 - 04/25 0700 In: 0  Out: 1585  Intake/Output this shift:     Basename 07/11/11 0600 07/10/11 1000 07/10/11 0317  HGB 10.9* 11.5* 11.0*    Basename 07/11/11 0600 07/10/11 1000  WBC 10.3 9.9  RBC 3.38* 3.47*  HCT 34.3* 35.7*  PLT 196 227    Basename 07/11/11 0600 07/10/11 1000 07/10/11 0317  NA 137 -- 142  K 5.7* -- 4.8  CL 96 -- 98  CO2 28 -- 27  BUN 22 -- 32*  CREATININE 5.73* 8.24* --  GLUCOSE 92 -- 176*  CALCIUM 9.2 -- 9.4   No results found for this basename: LABPT:2,INR:2 in the last 72 hours Splint removed minimal swelling compts  Soft and skin intact. Overall good alignment. circ intact Compartment soft Long leg cast with excellent padding applied   Assessment/Plan: Right closed tibia fibula diaphyseal fractures  PT OT consults ambulate toe touch weight bearing only  Plan long cast 6 weeksthen short cast till healed    i believe she would best be managed in a skilled facility as she lives alone and will be unable to tend to herself      For  DVT proph use aspirin versus lovenox as per medical team,  We will be available over weekend if needed .   Explained all to opatient and her daughter.   Very nice family [name] ANDREW 07/11/2011, 3:17 PM

## 2011-07-11 NOTE — Progress Notes (Signed)
Orthopedic Tech Progress Note Patient Details:  [name] 1928-04-09 [id number]  Casting Type of Cast: Long leg cast Cast Material: Fiberglass Cast Intervention: Application     Cammer, Mickie Bail 07/11/2011, 3:45 PM

## 2011-07-11 NOTE — Progress Notes (Signed)
Agree with P.T.   [name], OTR/L Pager: [phone] 07/11/2011 .

## 2011-07-11 NOTE — Progress Notes (Signed)
07/11/11 Spoke with patient about her diabetes.  States that she has had for a long time.  Checks CBGs at home twice a day.  Sees Dr. Imelda Pillow as her PCP.  States does not need to attend diabetes classes as outpatient.  Has attended these before.  Did order an RD consult in hospital for diet counseling with diabetes and kidney issues, since on hemodialysis.  Will continue to follow while in hospital.  Smith Mince RN BSN

## 2011-07-11 NOTE — Progress Notes (Signed)
07/11/11   10:45 PT/OT Note: Pt remains on strict bedrest as she awaits placement of right LE cast.  Will continue to follow and perform evaluation once cleared by orthopedic service to proceed with bedrest orders d/c'ed.  Thanks.  07/11/2011 Cephus Shelling, PT, DPT [phone]

## 2011-07-11 NOTE — Progress Notes (Addendum)
Clinical Social Work Department CLINICAL SOCIAL WORK PLACEMENT NOTE 07/11/2011  Patient:  [name], [name]  Account Number:  [id number] Admit date:  07/10/2011  Clinical Social Worker:  Unk Lightning, LCSW  Date/time:  07/11/2011 04:30 PM  Clinical Social Work is seeking post-discharge placement for this patient at the following level of care:   SKILLED NURSING   (*CSW will update this form in Epic as items are completed)   07/11/2011  Patient/family provided with [facility] Department of Clinical Social Work's list of facilities offering this level of care within the geographic area requested by the patient (or if unable, by the patient's family).  07/11/2011  Patient/family informed of their freedom to choose among providers that offer the needed level of care, that participate in Medicare, Medicaid or managed care program needed by the patient, have an available bed and are willing to accept the patient.  07/11/2011  Patient/family informed of [facility]' ownership interest in [facility], as well as of the fact that they are under no obligation to receive care at this facility.  PASARR submitted to EDS on 07/11/2011 PASARR number received from EDS on 07/12/2011  FL2 transmitted to all facilities in geographic area requested by pt/family on  07/11/2011 FL2 transmitted to all facilities within larger geographic area on   Patient informed that his/her managed care company has contracts with or will negotiate with  certain facilities, including the following:     Patient/family informed of bed offers received:  07/12/2011 Patient chooses bed at  Physician recommends and patient chooses bed at    Patient to be transferred to  on   Patient to be transferred to facility by   The following physician request were entered in Epic:   Additional Comments:

## 2011-07-11 NOTE — Progress Notes (Signed)
Clinical Social Work  CSW spoke with patient and dtr regarding MD recommendations for SNF. Patient and dtr both stated that they are deciding between [facility] vs SNF. Both are agreeable to completing a SNF search of the county in order to determine available beds. CSW completed FL2 and faxed up. CSW will meet with patient and dtr in the morning to discuss SNF options.   [place], [place] [phone]

## 2011-07-11 NOTE — Progress Notes (Signed)
Utilization Review Completed.[name] T4/25/2013   

## 2011-07-11 NOTE — Progress Notes (Signed)
[facility] ROUNDING NOTE   Subjective:   Interval History: none.  Objective:  Vital signs in last 24 hours:  Temp:  [97.3 F (36.3 C)-100 F (37.8 C)] 98.6 F (37 C) (04/25 0644) Pulse Rate:  [65-100] 86  (04/25 0644) Resp:  [18-20] 18  (04/25 0644) BP: (105-202)/(42-73) 111/44 mmHg (04/25 0644) SpO2:  [96 %-100 %] 100 % (04/25 0644) Weight:  [65.5 kg (144 lb 6.4 oz)-67.6 kg (149 lb 0.5 oz)] 65.6 kg (144 lb 10 oz) (04/24 1828)  Weight change:  Filed Weights   07/10/11 1315 07/10/11 1659 07/10/11 1828  Weight: 67.6 kg (149 lb 0.5 oz) 65.5 kg (144 lb 6.4 oz) 65.6 kg (144 lb 10 oz)    Intake/Output: I/O last 3 completed shifts: In: 0  Out: 1585 [Other:1585]   Intake/Output this shift:     CVS- RRR RS- CTA ABD- BS present soft non-distended EXT- no edema   Basic Metabolic Panel:  Lab 07/11/11 1610 07/10/11 1000 07/10/11 0317  NA 137 -- 142  K 5.7* -- 4.8  CL 96 -- 98  CO2 28 -- 27  GLUCOSE 92 -- 176*  BUN 22 -- 32*  CREATININE 5.73* 8.24* 8.04*  CALCIUM 9.2 -- 9.4  MG -- 2.8* --  PHOS -- -- --    Liver Function Tests:  Lab 07/10/11 1349  AST --  ALT 13  ALKPHOS --  BILITOT --  PROT --  ALBUMIN --   No results found for this basename: LIPASE:5,AMYLASE:5 in the last 168 hours No results found for this basename: AMMONIA:3 in the last 168 hours  CBC:  Lab 07/11/11 0600 07/10/11 1000 07/10/11 0317  WBC 10.3 9.9 10.2  NEUTROABS -- -- --  HGB 10.9* 11.5* 11.0*  HCT 34.3* 35.7* 34.6*  MCV 101.5* 102.9* 101.2*  PLT 196 227 235    Cardiac Enzymes: No results found for this basename: CKTOTAL:5,CKMB:5,CKMBINDEX:5,TROPONINI:5 in the last 168 hours  BNP: No components found with this basename: POCBNP:5  CBG:  Lab 07/11/11 0659 07/10/11 2127 07/10/11 1825 07/10/11 1153 07/10/11 0644  GLUCAP 87 112* 128* 84 151*    Microbiology: Results for orders placed during the hospital encounter of 07/14/10  MRSA PCR SCREENING     Status:  Normal   Collection Time   07/14/10  9:00 PM      Component Value Range Status Comment   MRSA by PCR    NEGATIVE  Final    Value: NEGATIVE            The GeneXpert MRSA Assay (FDA     approved for NASAL specimens     only), is one component of a     comprehensive MRSA colonization     surveillance program. It is not     intended to diagnose MRSA     infection nor to guide or     monitor treatment for     MRSA infections.    Coagulation Studies: No results found for this basename: LABPROT:5,INR:5 in the last 72 hours  Urinalysis: No results found for this basename: COLORURINE:2,APPERANCEUR:2,LABSPEC:2,PHURINE:2,GLUCOSEU:2,HGBUR:2,BILIRUBINUR:2,KETONESUR:2,PROTEINUR:2,UROBILINOGEN:2,NITRITE:2,LEUKOCYTESUR:2 in the last 72 hours    Imaging: Dg Chest 2 View  07/10/2011  *RADIOLOGY REPORT*  Clinical Data: Left tib-fib fracture for preop.  CHEST - 2 VIEW  Comparison: 07/16/2010  Findings: Borderline heart size with normal pulmonary vascularity. No focal airspace consolidation in the lungs.  No blunting of costophrenic angles.  No pneumothorax.  Postoperative changes in the right shoulder.  Calcification and tortuosity  of the aorta.  IMPRESSION: No evidence of active pulmonary disease.  Original Report Authenticated By: Marlon Pel, M.D.   Dg Tibia/fibula Right  07/10/2011  *RADIOLOGY REPORT*  Clinical Data: Severe pain after fall yesterday.  RIGHT TIBIA AND FIBULA - 2 VIEW  Comparison: None.  Findings: Acute appearing spiral oblique fracture of the distal right tibial shaft with posterior lateral displacement the distal fracture fragments.  Soft tissue swelling.  Postoperative change with plate screw fixation of the distal fibula.  Oblique fracture of the proximal fibular shaft with anterior and medial displacement of the distal fracture fragments.  No focal bone lesion or bone destruction.  The calcaneus appears to be short and configuration although the trabecular architecture appears  intact.  This could be due to positioning or old trauma.  If the patient is having pain in the region of the calcaneus, calcaneal views would be useful for further evaluation.  IMPRESSION: Acute fractures of the proximal fibula and distal tibia. Postoperative changes in the distal fibula.  Original Report Authenticated By: Marlon Pel, M.D.     Medications:      . DISCONTD: sodium chloride 100 mL/hr at 07/10/11 0636      . amLODipine  10 mg Oral Daily  . aspirin EC  81 mg Oral Daily  . calcium acetate  667 mg Oral TID WC  . cloNIDine  0.2 mg Oral BID  . enoxaparin  30 mg Subcutaneous Daily  .  HYDROmorphone (DILAUDID) injection  1 mg Intravenous Once  . insulin aspart  0-9 Units Subcutaneous TID WC  . insulin aspart  5 Units Subcutaneous BID WC  . isosorbide mononitrate  30 mg Oral Daily  . losartan  100 mg Oral QHS  . polyethylene glycol  17 g Oral Daily  . simvastatin  10 mg Oral QHS  . Vitamin D (Ergocalciferol)  50,000 Units Oral Weekly  . vitamin E  400 Units Oral Daily  . DISCONTD: methocarbamol  500 mg Oral TID   sodium chloride, sodium chloride, acetaminophen, acetaminophen, alteplase, feeding supplement (NEPRO CARB STEADY), heparin, hydrALAZINE, HYDROcodone-acetaminophen, HYDROmorphone, lidocaine, lidocaine-prilocaine, ondansetron (ZOFRAN) IV, ondansetron, pentafluoroprop-tetrafluoroeth, DISCONTD: furosemide, DISCONTD: HYDROmorphone, DISCONTD: ondansetron (ZOFRAN) IV, DISCONTD: promethazine  Assessment/ Plan:  Pt is a 78 y.o. yo female with a PMHX of ESRD was admitted to [facility] on 07/10/2011 with fractured tibia.  1.ESRD Dialysis MWF  2. HTN/Vol looks pretty controlled remove 2-3 L  3.Anemia Hb above 11  4. Bones will check outpatient records  5. Pain Avoid over sedation   No issues today make sure patient on low potassium diet plan dialysis in AM. Will give kayexalate 15g now    LOS: 1 Gidget Quizhpi W @TODAY @8 :02 AM

## 2011-07-11 NOTE — Progress Notes (Signed)
Subjective: Patient feeling better than yesterday, pain is less frequent, still intense. No BM, but passing gas. No chest pain or dyspnea.  Objective: Filed Vitals:   07/10/11 2124 07/11/11 0152 07/11/11 0644 07/11/11 0941  BP: 161/53 106/52 111/44 164/55  Pulse: 91 65 86   Temp: 100 F (37.8 C) 99.2 F (37.3 C) 98.6 F (37 C)   TempSrc:      Resp: 18 18 18    Weight:      SpO2: 99% 99% 100%    Weight change:   Intake/Output Summary (Last 24 hours) at 07/11/11 1251 Last data filed at 07/11/11 0500  Gross per 24 hour  Intake      0 ml  Output   1585 ml  Net  -1585 ml    General: Alert, awake, oriented x3, in no acute distress.  HEENT: No bruits, no goiter.  Heart: Regular rate and rhythm, without murmurs, rubs, gallops.  Lungs: CTA, bilateral air movement.  Abdomen: Soft, nontender, nondistended, positive bowel sounds.  Neuro: Grossly intact, nonfocal. Extremities: right LE with dressing.   Lab Results:  Basename 07/11/11 0600 07/10/11 1000 07/10/11 0317  NA 137 -- 142  K 5.7* -- 4.8  CL 96 -- 98  CO2 28 -- 27  GLUCOSE 92 -- 176*  BUN 22 -- 32*  CREATININE 5.73* 8.24* --  CALCIUM 9.2 -- 9.4  MG -- 2.8* --  PHOS -- -- --    Basename 07/10/11 1349  AST --  ALT 13  ALKPHOS --  BILITOT --  PROT --  ALBUMIN --    Basename 07/11/11 0600 07/10/11 1000  WBC 10.3 9.9  NEUTROABS -- --  HGB 10.9* 11.5*  HCT 34.3* 35.7*  MCV 101.5* 102.9*  PLT 196 227   Basename 07/10/11 0532  HGBA1C 7.2*    Basename 07/10/11 1000  TSH 5.463*  T4TOTAL --  T3FREE --  THYROIDAB --     Studies/Results: Dg Chest 2 View  07/10/2011  *RADIOLOGY REPORT*  Clinical Data: Left tib-fib fracture for preop.  CHEST - 2 VIEW  Comparison: 07/16/2010  Findings: Borderline heart size with normal pulmonary vascularity. No focal airspace consolidation in the lungs.  No blunting of costophrenic angles.  No pneumothorax.  Postoperative changes in the right shoulder.  Calcification and  tortuosity of the aorta.  IMPRESSION: No evidence of active pulmonary disease.  Original Report Authenticated By: Marlon Pel, M.D.   Dg Tibia/fibula Right  07/10/2011  *RADIOLOGY REPORT*  Clinical Data: Severe pain after fall yesterday.  RIGHT TIBIA AND FIBULA - 2 VIEW  Comparison: None.  Findings: Acute appearing spiral oblique fracture of the distal right tibial shaft with posterior lateral displacement the distal fracture fragments.  Soft tissue swelling.  Postoperative change with plate screw fixation of the distal fibula.  Oblique fracture of the proximal fibular shaft with anterior and medial displacement of the distal fracture fragments.  No focal bone lesion or bone destruction.  The calcaneus appears to be short and configuration although the trabecular architecture appears intact.  This could be due to positioning or old trauma.  If the patient is having pain in the region of the calcaneus, calcaneal views would be useful for further evaluation.  IMPRESSION: Acute fractures of the proximal fibula and distal tibia. Postoperative changes in the distal fibula.  Original Report Authenticated By: Marlon Pel, M.D.    Medications: I have reviewed the patient's current medications.  Tibia/fibula fracture (07/10/2011) Ortho recommend conservative management.  Patient will  have cast today.  Methocarbamol discontinue to avoid over sedation.  Miralax to avoid constipation.  PT, OT per rotho.  Incentive spirometry at bedside.  Pain slightly better controlled with current regimen. Transition today to oral pain medications.   ESRD (end stage renal disease) (07/10/2011) NSL. Renal Consulted. Patients on dialysis, M, W, F.  Increase potassium, received kayexalate. Change diet to renal diet. Will need to arrange outpatient dialysis center prior to discharge.   HTN (hypertension) (07/10/2011) Continue with Norvasc, Clonidine, Cozaar.   DM2 (diabetes mellitus, type 2)  (07/10/2011) Continue with insulin.  HB-A1c at 7.2. Diabetes education consulted for diet.   Hyperlipidemia (07/10/2011) Continue with Simvastatin.   Anemia (07/10/2011) Probably chronic diseases. Monitor hb.   DVT prophylaxis: Lovenox.  Increase TSH: Suspect increase secondary to acute illness.  I will check free T3 and T4.     LOS: 1 day   Alizae Bechtel M.D.  [facility] 07/11/2011, 12:51 PM

## 2011-07-11 NOTE — Progress Notes (Signed)
INITIAL ADULT NUTRITION ASSESSMENT Date: 07/11/2011   Time: 4:05 PM Reason for Assessment: consult  ASSESSMENT: Female 78 y.o.  Dx: Tibia/fibula fracture  Hx:  Past Medical History  Diagnosis Date  . CHF (congestive heart failure)   . Renal insufficiency   . Fracture 07/09/2011  . Hypertension   . Diabetes mellitus     insulin dependent  . Arthritis    Past Surgical History  Procedure Date  . Mastectomy   . Abdominal surgery     Related Meds:  Scheduled Meds:   . amLODipine  10 mg Oral Daily  . aspirin EC  81 mg Oral Daily  . calcium acetate  667 mg Oral TID WC  . cloNIDine  0.2 mg Oral BID  . enoxaparin  30 mg Subcutaneous Daily  . insulin aspart  0-9 Units Subcutaneous TID WC  . insulin aspart  5 Units Subcutaneous BID WC  . isosorbide mononitrate  30 mg Oral Daily  . losartan  100 mg Oral QHS  . polyethylene glycol  17 g Oral BID  . simvastatin  10 mg Oral QHS  . sodium polystyrene  15 g Oral NOW  . Vitamin D (Ergocalciferol)  50,000 Units Oral Weekly  . vitamin E  400 Units Oral Daily  . DISCONTD: polyethylene glycol  17 g Oral Daily   Continuous Infusions:  PRN Meds:.sodium chloride, sodium chloride, acetaminophen, acetaminophen, alteplase, feeding supplement (NEPRO CARB STEADY), heparin, hydrALAZINE, HYDROcodone-acetaminophen, HYDROmorphone, lidocaine, lidocaine-prilocaine, ondansetron (ZOFRAN) IV, ondansetron, pentafluoroprop-tetrafluoroeth   Ht:  5\' 1"  (per pt)  Wt: 144 lb 10 oz (65.6 kg)  Ideal Wt:    57.6 kg % Ideal Wt: 113%  Dry Wt: 64.5 kg (per pt) % Usual Wt: 101%  Food/Nutrition Related Hx: admitted with fall, lives at home alone, HD MWF, sees an RD at her dialysis factiliy 1-2 times per week.  Labs:  CMP     Component Value Date/Time   NA 137 07/11/2011 0600   K 5.7* 07/11/2011 0600   CL 96 07/11/2011 0600   CO2 28 07/11/2011 0600   GLUCOSE 92 07/11/2011 0600   BUN 22 07/11/2011 0600   CREATININE 5.73* 07/11/2011 0600   CALCIUM 9.2  07/11/2011 0600   CALCIUM 7.6* 08/28/2009 1044   PROT 6.8 08/27/2009 0736   ALBUMIN 3.4* 07/16/2010 0420   AST 20 08/27/2009 0736   ALT 13 07/10/2011 1349   ALKPHOS 87 08/27/2009 0736   BILITOT 0.3 08/27/2009 0736   GFRNONAA 6* 07/11/2011 0600   GFRAA 7* 07/11/2011 0600    CBC    Component Value Date/Time   WBC 10.3 07/11/2011 0600   RBC 3.38* 07/11/2011 0600   HGB 10.9* 07/11/2011 0600   HCT 34.3* 07/11/2011 0600   PLT 196 07/11/2011 0600   MCV 101.5* 07/11/2011 0600   MCH 32.2 07/11/2011 0600   MCHC 31.8 07/11/2011 0600   RDW 13.7 07/11/2011 0600   LYMPHSABS 1.2 07/14/2010 1152   MONOABS 0.8 07/14/2010 1152   EOSABS 0.8* 07/14/2010 1152   BASOSABS 0.0 07/14/2010 1152    Intake:  Output:   Intake/Output Summary (Last 24 hours) at 07/11/11 1608 Last data filed at 07/11/11 0500  Gross per 24 hour  Intake      0 ml  Output   1585 ml  Net  -1585 ml   No stool output No UOP  Diet Order: Renal 60/70  Supplements/Tube Feeding: none at this time  IVF:    Estimated Nutritional Needs:   Kcal: 1610-9604  Protein: 77-96g Fluid: <1.2 L/day or per MD discretion  Pt states she makes her HD appointments regularly in [place].  She meets with an RD at her dialysis center.  She follows a typical renal diet and ensures she is choosing best options by using renal-appropriate handouts which are posted in her kitchen.  She consumes an egg daily as a protein supplement and in addition consumes meat products.  Her favorite fruits are peaches, pears, and grapes.  She avoids dairy- sometimes has sherbet, occasional yogurt.  Denies any specific recommendations from her renal RD.   Pt also reports good glucose control at home.  She checks her sugars twice daily and makes every attempt to keep a schedule when administering insulin and testing sugar.  CBG (last 3)   Basename 07/11/11 1154 07/11/11 0659 07/10/11 2127  GLUCAP 165* 87 112*   Lab Results  Component Value Date   HGBA1C 7.2* 07/10/2011      NUTRITION DIAGNOSIS: -Increased nutrient needs (NI-5.1).  Status: Ongoing  RELATED TO: dialysis, healing  AS EVIDENCE BY: pt on hemodialysis, tib/fib fx  MONITORING/EVALUATION(Goals): 1.  Food/Beverage; pt consuming at least 75% of meals and at least 1 renal appropriate supplement per day. 2.  Labs; trend wnl with HD.  Monitor K and phos.  EDUCATION NEEDS: -Education needs addressed  INTERVENTION: 1.  Supplements; Nepro once daily  Dietitian #: [phone]  DOCUMENTATION CODES Per approved criteria  -Not Applicable    Loyce D[facility] 07/11/2011, 4:05 PM

## 2011-07-12 ENCOUNTER — Inpatient Hospital Stay (HOSPITAL_COMMUNITY): Payer: Medicare Other

## 2011-07-12 LAB — CBC
Hemoglobin: 10.1 g/dL — ABNORMAL LOW (ref 12.0–15.0)
MCH: 32.7 pg (ref 26.0–34.0)
MCHC: 32.4 g/dL (ref 30.0–36.0)
MCV: 101 fL — ABNORMAL HIGH (ref 78.0–100.0)

## 2011-07-12 LAB — RENAL FUNCTION PANEL
BUN: 40 mg/dL — ABNORMAL HIGH (ref 6–23)
CO2: 28 mEq/L (ref 19–32)
Calcium: 8.8 mg/dL (ref 8.4–10.5)
Creatinine, Ser: 8.15 mg/dL — ABNORMAL HIGH (ref 0.50–1.10)
GFR calc Af Amer: 5 mL/min — ABNORMAL LOW (ref >90)
Glucose, Bld: 162 mg/dL — ABNORMAL HIGH (ref 70–99)
Phosphorus: 6.4 mg/dL — ABNORMAL HIGH (ref 2.3–4.6)
Sodium: 137 mEq/L (ref 135–145)

## 2011-07-12 LAB — GLUCOSE, CAPILLARY
Glucose-Capillary: 174 mg/dL — ABNORMAL HIGH (ref 70–99)
Glucose-Capillary: 119 mg/dL — ABNORMAL HIGH (ref 70–99)

## 2011-07-12 LAB — T3, FREE: T3, Free: 1.7 pg/mL — ABNORMAL LOW (ref 2.3–4.2)

## 2011-07-12 MED ORDER — HYDROMORPHONE HCL PF 1 MG/ML IJ SOLN
1.0000 mg | INTRAMUSCULAR | Status: DC | PRN
  Administered 2011-07-12 – 2011-07-13 (×2): 1 mg via INTRAVENOUS
  Filled 2011-07-12 (×2): qty 1

## 2011-07-12 MED ORDER — PANTOPRAZOLE SODIUM 40 MG IV SOLR
40.0000 mg | Freq: Two times a day (BID) | INTRAVENOUS | Status: DC
  Administered 2011-07-13: 40 mg via INTRAVENOUS
  Filled 2011-07-12 (×4): qty 40

## 2011-07-12 NOTE — Procedures (Signed)
I have seen and examined this patient and agree with the plan of care seen on dialysis .  Lynnex Fulp W 07/12/2011, 10:34 AM

## 2011-07-12 NOTE — Progress Notes (Signed)
Physical Therapy Evaluation Note  Past Medical History  Diagnosis Date  . CHF (congestive heart failure)   . Renal insufficiency   . Fracture 07/09/2011  . Hypertension   . Diabetes mellitus     insulin dependent  . Arthritis     Past Surgical History  Procedure Date  . Mastectomy   . Abdominal surgery      07/12/11 1421  PT Visit Information  Last PT Received On 07/12/11  PT Time Calculation  PT Start Time 1420  PT Stop Time 1437  PT Time Calculation (min) 17 min  Subjective Data  Subjective Pt received supine in bed with R LE cast on.  Precautions  Precautions Fall  Required Braces or Orthoses (R LE in long leg cast)  Restrictions  Weight Bearing Restrictions Yes  RLE Weight Bearing NWB (NWB per PA note on 4/26, TWB per MD note on day of surgery)  Home Living  Lives With Alone  Available Help at Discharge (none)  Type of Home House  Home Access Stairs to enter  Entrance Stairs-Number of Steps 3  Entrance Stairs-Rails Right  Home Layout One level  Bathroom Shower/Tub Walk-in shower;Tub/shower Financial planner (also has increased height)  Home Adaptive Equipment Straight cane;Walker - rolling  Additional Comments pt plans on going to rehab due to living alone  Prior Function  Level of Independence Independent  Able to Take Stairs? Yes  Driving Yes  Vocation Retired  Geneticist, molecular No difficulties  Cognition  Overall Cognitive Status Appears within functional limits for tasks assessed/performed  Arousal/Alertness Awake/alert  Orientation Level Appears intact for tasks assessed  Behavior During Session [facility] for tasks performed  Right Upper Extremity Assessment  RUE ROM/Strength/Tone WFL  Left Upper Extremity Assessment  LUE ROM/Strength/Tone WFL  Right Lower Extremity Assessment  RLE ROM/Strength/Tone (pt in long leg cast, can wiggle toes, unable to hold R LE up)  Left Lower Extremity Assessment  LLE ROM/Strength/Tone [facility]    Trunk Assessment  Trunk Assessment Normal  Bed Mobility  Bed Mobility Supine to Sit;Sitting - Scoot to Edge of Bed  Supine to Sit With rails;HOB elevated;1: +2 Total assist (HOB 30 degress)  Supine to Sit: Patient Percentage 50%  Sitting - Scoot to Edge of Bed 2: Max assist  Details for Bed Mobility Assistance assist for R LE required and at trunk, pt with increased pain and high anxiety re: "dropping" R LE  Transfers  Transfers Sit to Stand;Stand Pivot Transfers  Sit to Stand 1: +2 Total assist;From bed  Sit to Stand: Patient Percentage 70%  Stand Pivot Transfers 1: +2 Total assist (with RW)  Stand Pivot Transfers: Patient Percentage 70%  Details for Transfer Assistance 1 person required to hold R LE up due to pt inability to do so, max directional verbal cues, pt pvt on ball of Left foot, attempted to hop a few times but pt anxious re: pain in R LE  Ambulation/Gait  Ambulation/Gait Assistance Not tested (comment) (MD orders for bed/chair transfers only)  Stairs No  PT - End of Session  Equipment Utilized During Treatment Gait belt  Activity Tolerance Patient limited by pain  Patient left in chair;with call bell/phone within reach;with nursing in room (RN present for std pvt transfer and aware how to assist patient back to bed)  Nurse Communication Mobility status;Precautions;Weight bearing status  PT Assessment  Clinical Impression Statement Pt s/p R tib/fib fracture now in R LE long leg cast. Patient motivated however unsafe to  return home due to living alone. Pt I PTA and to benefit from SNF placement to maximize I function for safe transition home when appropriate.  PT Recommendation/Assessment Patient needs continued PT services  PT Problem List Decreased activity tolerance;Decreased range of motion;Decreased strength;Decreased balance  Barriers to Discharge Decreased caregiver support  PT Therapy Diagnosis  Difficulty walking;Abnormality of gait;Generalized weakness;Acute  pain  PT Plan  PT Frequency Min 5X/week  PT Treatment/Interventions Gait training;Stair training;Functional mobility training;Therapeutic activities;Therapeutic exercise  PT Recommendation  Follow Up Recommendations Skilled nursing facility;Supervision/Assistance - 24 hour  Equipment Recommended Defer to next venue  Individuals Consulted  Consulted and Agree with Results and Recommendations Patient;Family member/caregiver  Family Member Consulted granddaughter  Acute Rehab PT Goals  PT Goal Formulation With patient  Time For Goal Achievement 07/26/11  Potential to Achieve Goals Good  Pt will go Supine/Side to Sit with modified independence;with HOB 0 degrees  PT Goal: Supine/Side to Sit - Progress Goal set today  Pt will go Sit to Supine/Side with modified independence;with HOB 0 degrees  PT Goal: Sit to Supine/Side - Progress Goal set today  Pt will go Sit to Stand with modified independence (up to RW)  PT Goal: Sit to Stand - Progress Goal set today  Pt will Transfer Bed to Chair/Chair to Bed with modified independence (with RW.)  PT Transfer Goal: Bed to Chair/Chair to Bed - Progress Goal set today  Pt will Ambulate 16 - 50 feet;with modified independence;with rolling walker (when MD approves.)  PT Goal: Ambulate - Progress Goal set today  Written Expression  Dominant Hand Right     Pain: 8/10 R LE Pain with mvmt especially in dependent position  Lewis Shock, PT, DPT Pager #: [phone] Office #: [phone]

## 2011-07-12 NOTE — Progress Notes (Signed)
Subjective: Just a little pain in ankle otherwise better   Objective: Vital signs in last 24 hours: Temp:  [97.8 F (36.6 C)-98.3 F (36.8 C)] 98.3 F (36.8 C) (04/26 0547) Pulse Rate:  [65-73] 73  (04/26 0547) Resp:  [18] 18  (04/26 0547) BP: (130-177)/(41-71) 151/43 mmHg (04/26 0547) SpO2:  [94 %-98 %] 98 % (04/26 0547)  Intake/Output from previous day: 04/25 0701 - 04/26 0700 In: 240 [P.O.:240] Out: -  Intake/Output this shift: Total I/O In: 240 [P.O.:240] Out: -    Basename 07/11/11 0600 07/10/11 1000 07/10/11 0317  HGB 10.9* 11.5* 11.0*    Basename 07/11/11 0600 07/10/11 1000  WBC 10.3 9.9  RBC 3.38* 3.47*  HCT 34.3* 35.7*  PLT 196 227    Basename 07/11/11 0600 07/10/11 1000 07/10/11 0317  NA 137 -- 142  K 5.7* -- 4.8  CL 96 -- 98  CO2 28 -- 27  BUN 22 -- 32*  CREATININE 5.73* 8.24* --  GLUCOSE 92 -- 176*  CALCIUM 9.2 -- 9.4   No results found for this basename: LABPT:2,INR:2 in the last 72 hours  Pt cons alert comfortable with right leg elevated. Toes without swelling, good sense, brisk cap refill, good motor. Proximal cast well padded.  Assessment/Plan: S/P long leg casting right tib/fib fx well fitted.  Plan  Recommend SNF placement due to unable to care for self alone, and need for transportation to Dialysis 3 times a week. Will have checked over weekend by coverage. May start PT non weightbearing right leg for transfers. If placed and D/Cd Monday will need F/U with Dr Thomasena Edis in office in 3 weeks. Call [phone] for arranging that appointment .     Jamelle Rushing 07/12/2011, 6:59 AM

## 2011-07-12 NOTE — Progress Notes (Signed)
Agree with OT note.  Will also clarify weightbearing status as well as activity level prior to evaluation.  Thanks.  07/12/2011 Cephus Shelling, PT, DPT [phone]

## 2011-07-12 NOTE — Progress Notes (Signed)
PT/OT Cancellation Note  Treatment cancelled today due to pt. at HD will re-attempt when pt. able to participate.Marland Kitchen  Ammie Warrick, OTR/L Pager [phone] 07/12/2011, 8:50 AM

## 2011-07-12 NOTE — Progress Notes (Signed)
[facility] ROUNDING NOTE   Subjective:   Interval History: none.  Objective:  Vital signs in last 24 hours:  Temp:  [97.8 F (36.6 C)-98.3 F (36.8 C)] 98.3 F (36.8 C) (04/26 0547) Pulse Rate:  [65-73] 73  (04/26 0547) Resp:  [18] 18  (04/26 0547) BP: (130-177)/(41-71) 151/43 mmHg (04/26 0547) SpO2:  [94 %-98 %] 98 % (04/26 0547)  Weight change:  Filed Weights   07/10/11 1315 07/10/11 1659 07/10/11 1828  Weight: 67.6 kg (149 lb 0.5 oz) 65.5 kg (144 lb 6.4 oz) 65.6 kg (144 lb 10 oz)    Intake/Output: I/O last 3 completed shifts: In: 240 [P.O.:240] Out: 0    Intake/Output this shift:     CVS- RRR RS- CTA ABD- BS present soft non-distended EXT- no edema   Basic Metabolic Panel:  Lab 07/11/11 9604 07/10/11 1000 07/10/11 0317  NA 137 -- 142  K 5.7* -- 4.8  CL 96 -- 98  CO2 28 -- 27  GLUCOSE 92 -- 176*  BUN 22 -- 32*  CREATININE 5.73* 8.24* 8.04*  CALCIUM 9.2 -- 9.4  MG -- 2.8* --  PHOS -- -- --    Liver Function Tests:  Lab 07/10/11 1349  AST --  ALT 13  ALKPHOS --  BILITOT --  PROT --  ALBUMIN --   No results found for this basename: LIPASE:5,AMYLASE:5 in the last 168 hours No results found for this basename: AMMONIA:3 in the last 168 hours  CBC:  Lab 07/11/11 0600 07/10/11 1000 07/10/11 0317  WBC 10.3 9.9 10.2  NEUTROABS -- -- --  HGB 10.9* 11.5* 11.0*  HCT 34.3* 35.7* 34.6*  MCV 101.5* 102.9* 101.2*  PLT 196 227 235    Cardiac Enzymes: No results found for this basename: CKTOTAL:5,CKMB:5,CKMBINDEX:5,TROPONINI:5 in the last 168 hours  BNP: No components found with this basename: POCBNP:5  CBG:  Lab 07/12/11 0623 07/11/11 2150 07/11/11 1653 07/11/11 1154 07/11/11 0659  GLUCAP 174* 168* 86 165* 87    Microbiology: Results for orders placed during the hospital encounter of 07/14/10  MRSA PCR SCREENING     Status: Normal   Collection Time   07/14/10  9:00 PM      Component Value Range Status Comment   MRSA by PCR     NEGATIVE  Final    Value: NEGATIVE            The GeneXpert MRSA Assay (FDA     approved for NASAL specimens     only), is one component of a     comprehensive MRSA colonization     surveillance program. It is not     intended to diagnose MRSA     infection nor to guide or     monitor treatment for     MRSA infections.    Coagulation Studies: No results found for this basename: LABPROT:5,INR:5 in the last 72 hours  Urinalysis: No results found for this basename: COLORURINE:2,APPERANCEUR:2,LABSPEC:2,PHURINE:2,GLUCOSEU:2,HGBUR:2,BILIRUBINUR:2,KETONESUR:2,PROTEINUR:2,UROBILINOGEN:2,NITRITE:2,LEUKOCYTESUR:2 in the last 72 hours    Imaging: No results found.   Medications:        . amLODipine  10 mg Oral Daily  . aspirin EC  81 mg Oral Daily  . calcium acetate  667 mg Oral TID WC  . cloNIDine  0.2 mg Oral BID  . enoxaparin  30 mg Subcutaneous Daily  . feeding supplement (NEPRO CARB STEADY)  237 mL Oral Daily  . insulin aspart  0-9 Units Subcutaneous TID WC  . insulin aspart  5 Units Subcutaneous BID WC  . isosorbide mononitrate  30 mg Oral Daily  . losartan  100 mg Oral QHS  . polyethylene glycol  17 g Oral BID  . simvastatin  10 mg Oral QHS  . Vitamin D (Ergocalciferol)  50,000 Units Oral Weekly  . vitamin E  400 Units Oral Daily  . DISCONTD: polyethylene glycol  17 g Oral Daily   sodium chloride, sodium chloride, acetaminophen, acetaminophen, feeding supplement (NEPRO CARB STEADY), heparin, hydrALAZINE, HYDROcodone-acetaminophen, HYDROmorphone, lidocaine, lidocaine-prilocaine, ondansetron (ZOFRAN) IV, ondansetron, pentafluoroprop-tetrafluoroeth  Assessment/ Plan:  Pt is a 78 y.o. yo female with a PMHX of ESRD was admitted to [facility] on 07/10/2011 with fractured tibia.  1.ESRD Dialysis MWF  2. HTN/Vol looks pretty controlled remove 2-3 L  3.Anemia Hb above 11  4. Bones will check outpatient records  5. Pain Avoid over sedation   Plan dialysis today   LOS:  2 Kena Limon W @TODAY @9 :15 AM

## 2011-07-12 NOTE — Progress Notes (Signed)
Subjective: Patient on dialysis. Feeling well. Still with leg pain.  Had a BM.  Objective: Filed Vitals:   07/12/11 0914 07/12/11 0930 07/12/11 1000 07/12/11 1030  BP: 168/56 187/59 176/58 180/53  Pulse: 65 59 64 68  Temp:      TempSrc:      Resp: 20 20 20 20   Weight:      SpO2:       Weight change:   Intake/Output Summary (Last 24 hours) at 07/12/11 1155 Last data filed at 07/12/11 0000  Gross per 24 hour  Intake    240 ml  Output      0 ml  Net    240 ml    General: Alert, awake, oriented x3, in no acute distress.  HEENT: No bruits, no goiter.  Heart: Regular rate and rhythm, without murmurs, rubs, gallops.  Lungs: CTA, bilateral air movement.  Abdomen: Soft, nontender, nondistended, positive bowel sounds.  Neuro: Grossly intact, nonfocal. Extremities: cast right LE.   Lab Results:  Basename 07/12/11 0943 07/11/11 0600 07/10/11 1000  NA 137 137 --  K 4.5 5.7* --  CL 92* 96 --  CO2 28 28 --  GLUCOSE 162* 92 --  BUN 40* 22 --  CREATININE 8.15* 5.73* --  CALCIUM 8.8 9.2 --  MG -- -- 2.8*  PHOS 6.4* -- --    Basename 07/12/11 0943 07/10/11 1349  AST -- --  ALT -- 13  ALKPHOS -- --  BILITOT -- --  PROT -- --  ALBUMIN 3.1* --    Basename 07/12/11 0942 07/11/11 0600  WBC 9.7 10.3  NEUTROABS -- --  HGB 10.1* 10.9*  HCT 31.2* 34.3*  MCV 101.0* 101.5*  PLT 202 196   Basename 07/10/11 0532  HGBA1C 7.2*    Basename 07/11/11 1336 07/10/11 1000  TSH -- 5.463*  T4TOTAL -- --  T3FREE 1.7* --  THYROIDAB -- --    No results found.  Medications: I have reviewed the patient's current medications.  Tibia/fibula fracture (07/10/2011) Ortho recommend conservative management.  Patient will have cast today.  Methocarbamol discontinue to avoid over sedation.  Miralax to avoid constipation.  PT, OT per rotho.  Incentive spirometry at bedside.  Patient admitted with severe pain, we need to be careful with sedation due to her renal failure and  comorbidity. Yesterday she required multiple IV dose pain medications. Will try today oral medications.  Ortho recommend SNF.   ESRD (end stage renal disease) (07/10/2011) NSL. Renal Consulted. Patients on dialysis, M, W, F.  Will need to arrange outpatient dialysis center prior to discharge.   HTN (hypertension) (07/10/2011) Continue with Norvasc, Clonidine. I will discontinue Cozaar due to hyperkalemia.  She didn't received BP medication this morning because she was going for dialysis. Will follow BP, if needed will add hydralazine.   DM2 (diabetes mellitus, type 2) (07/10/2011) Continue with insulin.  HB-A1c at 7.2. Diabetes education consulted for diet.   Hyperlipidemia (07/10/2011) Continue with Simvastatin.   Anemia (07/10/2011) Probably chronic diseases. Monitor hb.  DVT prophylaxis: Lovenox.  Increase TSH: Suspect increase secondary to acute illness. Free T4 normal, T3 low. She will need Thyroid function test in 4 weeks.      LOS: 2 days   Yacqub Baston M.D.  [facility] 07/12/2011, 11:55 AM

## 2011-07-12 NOTE — Progress Notes (Signed)
Clinical Social Work   CSW went to patient's room but patient was in dialysis. CSW left SNF bed offers in room. CSW called dtr and gave her bed offers. Per dtr, she does not want patient to go to SNF. At this point, dtr reports patient will return home with her and dtr will make arrangements for patient. CSW is signing off but available if needed.  [place], [place] [phone]

## 2011-07-12 NOTE — Progress Notes (Signed)
07/12/2011 3:19 PM Hemodialysis outpatient note; This patient has been accepted at the [facility] dialysis location on a TTS 2nd shift schedule.Please have patient arrive at 1030 am to sign paperwork. Thank you.

## 2011-07-13 ENCOUNTER — Inpatient Hospital Stay (HOSPITAL_COMMUNITY): Payer: Medicare Other

## 2011-07-13 LAB — CBC
Platelets: 206 10*3/uL (ref 150–400)
RBC: 3.07 MIL/uL — ABNORMAL LOW (ref 3.87–5.11)
WBC: 8.8 10*3/uL (ref 4.0–10.5)

## 2011-07-13 LAB — RENAL FUNCTION PANEL
CO2: 25 mEq/L (ref 19–32)
Chloride: 90 mEq/L — ABNORMAL LOW (ref 96–112)
GFR calc Af Amer: 8 mL/min — ABNORMAL LOW (ref >90)
GFR calc non Af Amer: 7 mL/min — ABNORMAL LOW (ref >90)
Sodium: 133 mEq/L — ABNORMAL LOW (ref 135–145)

## 2011-07-13 LAB — GLUCOSE, CAPILLARY: Glucose-Capillary: 138 mg/dL — ABNORMAL HIGH (ref 70–99)

## 2011-07-13 MED ORDER — HEPARIN SODIUM (PORCINE) 1000 UNIT/ML DIALYSIS: 20.0000 [IU]/kg | INTRAMUSCULAR | Status: DC | PRN

## 2011-07-13 MED ORDER — HEPARIN SODIUM (PORCINE) 1000 UNIT/ML DIALYSIS
2000.0000 [IU] | INTRAMUSCULAR | Status: DC | PRN
  Administered 2011-07-13: 2000 [IU] via INTRAVENOUS_CENTRAL
  Filled 2011-07-13: qty 2

## 2011-07-13 MED ORDER — HYDROMORPHONE HCL PF 1 MG/ML IJ SOLN
INTRAMUSCULAR | Status: AC
  Administered 2011-07-13: 1 mg via INTRAVENOUS_CENTRAL
  Filled 2011-07-13: qty 1

## 2011-07-13 MED ORDER — PANTOPRAZOLE SODIUM 40 MG PO TBEC
40.0000 mg | DELAYED_RELEASE_TABLET | Freq: Every day | ORAL | Status: DC
  Filled 2011-07-13: qty 1

## 2011-07-13 MED ORDER — HEPARIN SODIUM (PORCINE) 1000 UNIT/ML DIALYSIS: 2000.0000 [IU] | INTRAMUSCULAR | Status: DC | PRN

## 2011-07-13 NOTE — Evaluation (Signed)
Occupational Therapy Evaluation Patient Details Name: [name] MRN: [id number] DOB: 1928-04-23 Today's Date: 07/13/2011 Time: 7829-5621 OT Time Calculation (min): 35 min  OT Assessment / Plan / Recommendation Clinical Impression  Pt. presents s/p rt. LE tib/fib fracture and with long leg cast and pain. pt. will benefit from skilled OT to increase functional independence with ADLs to min assist-sueprvision level to decrease burden of care at D/C home.    OT Assessment  Patient needs continued OT Services    Follow Up Recommendations  Skilled nursing facility;Supervision/Assistance - 24 hour with HHOT if pt. Declines ST-SNF stay. HIGHLY recommend ST-SNF, however pt. Currently declining.   Equipment Recommendations  Defer to next venue; If D/C home will need 3 in 1 bedside comode;Wheelchair    Frequency Min 2X/week    Precautions / Restrictions Precautions Precautions: Fall Required Braces or Orthoses:  (Rt LE in long leg cast) Restrictions Weight Bearing Restrictions: Yes RLE Weight Bearing: Non weight bearing   Pertinent Vitals/Pain 8/10 rt. LE; RN presented pain meds during session    ADL  Eating/Feeding: Performed;Independent Where Assessed - Eating/Feeding: Bed level Grooming: Performed;Wash/dry face;Set up Where Assessed - Grooming: Unsupported sitting Upper Body Bathing: Simulated;Chest;Right arm;Left arm;Abdomen;Set up Where Assessed - Upper Body Bathing: Sitting, bed Lower Body Bathing: Simulated;Moderate assistance Where Assessed - Lower Body Bathing: Sit to stand from bed Upper Body Dressing: Simulated;Set up Where Assessed - Upper Body Dressing: Sitting, bed Lower Body Dressing: Simulated;Moderate assistance Where Assessed - Lower Body Dressing: Sit to stand from chair Toilet Transfer: Simulated;+2 Total assistance;Comment for patient % (pt=70%) Toilet Transfer Method: Stand pivot Toilet Transfer Equipment: Bedside commode Toileting - Clothing  Manipulation: Performed;Moderate assistance Where Assessed - Toileting Clothing Manipulation: Sit to stand from 3-in-1 or toilet Toileting - Hygiene: Performed;Moderate assistance Where Assessed - Toileting Hygiene: Sit to stand from 3-in-1 or toilet Tub/Shower Transfer: Not assessed Tub/Shower Transfer Method: Not assessed Equipment Used: Gait belt Ambulation Related to ADLs: NA ADL Comments: Pt. requiring +2 assist due to pt. unable to keep rt. LE lifted due to weight of long cast. Pt. educated on techniques for completing safe transfers and use of chair and pillows to keep rt. LE elevated while pt. is using 3-in-1 or in chair (without leg rest). Pt. would benefit from ST-SNF stay due to continuing to require physical assist with transfers and ADLs at this time, however pt. requesting D/C home. Educated pt. on decreased safety if at home with transfers due to pt. still requiring assist during transfers. Pt. aware of need for help during transfers and still requests D/C home with [facility] services. Pt. with high anxiety during session and provided with mod verbal cues throughout for encouragement and to decrease anxiety level and encourage deep calm breathing.    OT Goals Acute Rehab OT Goals OT Goal Formulation: With patient Time For Goal Achievement: 07/27/11 Potential to Achieve Goals: Good ADL Goals Pt Will Perform Grooming: with set-up;with supervision;Standing at sink;Other (comment) (maintaining NWB Rt. LE) ADL Goal: Grooming - Progress: Goal set today Pt Will Perform Upper Body Dressing: with set-up;with min assist;Sit to stand from bed;with adaptive equipment;Other (comment) (NWB Rt. LE) ADL Goal: Upper Body Dressing - Progress: Goal set today Pt Will Perform Lower Body Dressing: with set-up;with min assist;Sit to stand from bed;with adaptive equipment;Other (comment) (NWB Rt. LE) ADL Goal: Lower Body Dressing - Progress: Goal set today Pt Will Transfer to Toilet: with min assist;Stand  pivot transfer;3-in-1;Maintaining weight bearing status ADL Goal: Toilet Transfer - Progress: Goal  set today Pt Will Perform Toileting - Hygiene: with modified independence;Sitting on 3-in-1 or toilet ADL Goal: Toileting - Hygiene - Progress: Goal set today Additional ADL Goal #1: Pt. will complete bed mobility with Min assist as precurser for BADLs ADL Goal: Additional Goal #1 - Progress: Goal set today  Visit Information  Last OT Received On: 07/13/11 Assistance Needed: +2    Subjective Data  Subjective: "I need to get moving" Patient Stated Goal: I want to go home   Prior Functioning  Home Living Lives With: Alone Type of Home: House Home Access: Stairs to enter [employer] of Steps: 3 Entrance Stairs-Rails: Right Home Layout: One level Bathroom Shower/Tub: Walk-in shower;Tub/shower unit Bathroom Toilet: Standard Home Adaptive Equipment: Straight cane;Walker - rolling Prior Function Level of Independence: Independent Able to Take Stairs?: Yes Driving: Yes Vocation: Retired    IT consultant  Overall Cognitive Status: Appears within functional limits for tasks assessed/performed Arousal/Alertness: Awake/alert Orientation Level: Appears intact for tasks assessed Behavior During Session: Anxious    Extremity/Trunk Assessment     Mobility Bed Mobility Bed Mobility: Supine to Sit;Sitting - Scoot to Edge of Bed Supine to Sit: 3: Mod assist;HOB elevated;With rails (30 degrees) Sitting - Scoot to Edge of Bed: 4: Min assist Details for Bed Mobility Assistance: assist for R LE required and at trunk, pt with increased pain and high anxiety re: "dropping" R LE Transfers Transfers: Sit to Stand;Stand to Sit Sit to Stand: 1: +2 Total assist;From bed Sit to Stand: Patient Percentage: 70% Stand to Sit: 1: +2 Total assist;To bed;To chair/3-in-1 Stand to Sit: Patient Percentage: 70% Details for Transfer Assistance: Pt. requires 1 person to maintain rt. LE in elevated position  during transfers and max verbal and gestural cuing for transfer technique.         End of Session OT - End of Session Equipment Utilized During Treatment: Gait belt Activity Tolerance: Patient tolerated treatment well Patient left: in chair;with call bell/phone within reach Nurse Communication: Mobility status   [name], OTR/L Pager [phone] 07/13/2011, 10:57 AM

## 2011-07-13 NOTE — Progress Notes (Signed)
Patient set up for new outpt HD at [facility] on TTS 2nd shift schedule. She had dialysis yesterday, and is otherwise ready for d/c per primary MD.  We will do short HD today and then is OK for d/c from renal standpoint. We will give pt instructions on when and where to go for outpt HD on Tuesday.   Vinson Moselle  MD [employer] [phone] pgr    [phone] cell 07/13/2011, 9:54 AM

## 2011-07-13 NOTE — Progress Notes (Signed)
Patient feeling nauseated, sick stomach. She vomit yesterday after dialysis. I will cancel discharge. Will discharge tomorrow if patient tolerates diet.  Iam Lipson. MD.

## 2011-07-13 NOTE — Progress Notes (Signed)
Orthopedics Progress Note  Subjective: Feeling better this morning.  Still complains of pain at the fracture site  Objective:  Filed Vitals:   07/13/11 0514  BP: 138/61  Pulse: 59  Temp: 97.9 F (36.6 C)  Resp: 18    General: Awake and alert  Musculoskeletal: right leg LLC in good repair Toes not swollen and wiggles toes without pain Neurovascularly intact  Lab Results  Component Value Date   WBC 9.7 07/12/2011   HGB 10.1* 07/12/2011   HCT 31.2* 07/12/2011   MCV 101.0* 07/12/2011   PLT 202 07/12/2011       Component Value Date/Time   NA 137 07/12/2011 0943   K 4.5 07/12/2011 0943   CL 92* 07/12/2011 0943   CO2 28 07/12/2011 0943   GLUCOSE 162* 07/12/2011 0943   BUN 40* 07/12/2011 0943   CREATININE 8.15* 07/12/2011 0943   CALCIUM 8.8 07/12/2011 0943   CALCIUM 7.6* 08/28/2009 1044   GFRNONAA 4* 07/12/2011 0943   GFRAA 5* 07/12/2011 0943    No results found for this basename: INR, PROTIME    Assessment/Plan:  s/p Long leg casting for stable tibia fx. Strict NWB on the right LE SNF placement for mobility concerns Mobilization DVT prophylaxis  Almedia Balls. Ranell Patrick, MD 07/13/2011 9:48 AM

## 2011-07-13 NOTE — Progress Notes (Signed)
[facility] ROUNDING NOTE   Subjective:   Interval History: none.  Objective:  Vital signs in last 24 hours:  Temp:  [97.7 F (36.5 C)-99 F (37.2 C)] 97.7 F (36.5 C) (04/27 1030) Pulse Rate:  [59-89] 62  (04/27 1225) Resp:  [16-20] 18  (04/27 1225) BP: (117-201)/(47-68) 159/58 mmHg (04/27 1225) SpO2:  [94 %-97 %] 94 % (04/27 1030) Weight:  [64.1 kg (141 lb 5 oz)] 64.1 kg (141 lb 5 oz) (04/26 1300)  Weight change:  Filed Weights   07/10/11 1828 07/12/11 0855 07/12/11 1300  Weight: 65.6 kg (144 lb 10 oz) 67 kg (147 lb 11.3 oz) 64.1 kg (141 lb 5 oz)    Intake/Output: I/O last 3 completed shifts: In: 240 [P.O.:240] Out: 1600 [Other:1600]   Intake/Output this shift:  Total I/O In: 240 [P.O.:240] Out: -   CVS- RRR RS- CTA ABD- BS present soft non-distended EXT- no edema   Basic Metabolic Panel:  Lab 07/13/11 3244 07/12/11 0943 07/11/11 0600 07/10/11 1000 07/10/11 0317  NA 133* 137 137 -- 142  K 4.2 4.5 5.7* -- 4.8  CL 90* 92* 96 -- 98  CO2 25 28 28  -- 27  GLUCOSE 212* 162* 92 -- 176*  BUN 23 40* 22 -- 32*  CREATININE 5.20* 8.15* 5.73* 8.24* 8.04*  CALCIUM 9.4 8.8 9.2 -- --  MG -- -- -- 2.8* --  PHOS 4.2 6.4* -- -- --    Liver Function Tests:  Lab 07/13/11 1045 07/12/11 0943 07/10/11 1349  AST -- -- --  ALT -- -- 13  ALKPHOS -- -- --  BILITOT -- -- --  PROT -- -- --  ALBUMIN 3.0* 3.1* --   No results found for this basename: LIPASE:5,AMYLASE:5 in the last 168 hours No results found for this basename: AMMONIA:3 in the last 168 hours  CBC:  Lab 07/13/11 1045 07/12/11 0942 07/11/11 0600 07/10/11 1000 07/10/11 0317  WBC 8.8 9.7 10.3 9.9 10.2  NEUTROABS -- -- -- -- --  HGB 9.9* 10.1* 10.9* 11.5* 11.0*  HCT 30.8* 31.2* 34.3* 35.7* 34.6*  MCV 100.3* 101.0* 101.5* 102.9* 101.2*  PLT 206 202 196 227 235    Cardiac Enzymes: No results found for this basename: CKTOTAL:5,CKMB:5,CKMBINDEX:5,TROPONINI:5 in the last 168 hours  BNP: No  components found with this basename: POCBNP:5  CBG:  Lab 07/13/11 0641 07/12/11 2110 07/12/11 1619 07/12/11 0623 07/11/11 2150  GLUCAP 134* 206* 119* 174* 168*    Microbiology: Results for orders placed during the hospital encounter of 07/14/10  MRSA PCR SCREENING     Status: Normal   Collection Time   07/14/10  9:00 PM      Component Value Range Status Comment   MRSA by PCR    NEGATIVE  Final    Value: NEGATIVE            The GeneXpert MRSA Assay (FDA     approved for NASAL specimens     only), is one component of a     comprehensive MRSA colonization     surveillance program. It is not     intended to diagnose MRSA     infection nor to guide or     monitor treatment for     MRSA infections.    Coagulation Studies: No results found for this basename: LABPROT:5,INR:5 in the last 72 hours  Urinalysis: No results found for this basename: COLORURINE:2,APPERANCEUR:2,LABSPEC:2,PHURINE:2,GLUCOSEU:2,HGBUR:2,BILIRUBINUR:2,KETONESUR:2,PROTEINUR:2,UROBILINOGEN:2,NITRITE:2,LEUKOCYTESUR:2 in the last 72 hours    Imaging: No results found.  Medications:        . amLODipine  10 mg Oral Daily  . aspirin EC  81 mg Oral Daily  . calcium acetate  667 mg Oral TID WC  . cloNIDine  0.2 mg Oral BID  . enoxaparin  30 mg Subcutaneous Daily  . feeding supplement (NEPRO CARB STEADY)  237 mL Oral Daily  . HYDROmorphone      . insulin aspart  0-9 Units Subcutaneous TID WC  . insulin aspart  5 Units Subcutaneous BID WC  . isosorbide mononitrate  30 mg Oral Daily  . pantoprazole  40 mg Oral Q1200  . polyethylene glycol  17 g Oral BID  . simvastatin  10 mg Oral QHS  . Vitamin D (Ergocalciferol)  50,000 Units Oral Weekly  . vitamin E  400 Units Oral Daily  . DISCONTD: pantoprazole (PROTONIX) IV  40 mg Intravenous Q12H   sodium chloride, sodium chloride, acetaminophen, acetaminophen, feeding supplement (NEPRO CARB STEADY), heparin, hydrALAZINE, HYDROcodone-acetaminophen, HYDROmorphone,  lidocaine, lidocaine-prilocaine, ondansetron (ZOFRAN) IV, ondansetron, pentafluoroprop-tetrafluoroeth, DISCONTD: heparin, DISCONTD: heparin, DISCONTD: heparin  Assessment/ Plan:  Pt is a 78 y.o. yo female with a PMHX of ESRD was admitted to [facility] on 07/10/2011 with fractured tibia.  1.ESRD Dialysis MWF will be sent home on outpatient schedule TTS. Will dialyze today 2. HTN/Vol looks pretty controlled remove 2-3 L  3.Anemia Hb above 11  4. Bones stable follow PTH 5. Pain Avoid over sedation  Plan dialysis today short treatment   LOS: 3 [name] W @TODAY @12 :35 PM

## 2011-07-13 NOTE — Progress Notes (Signed)
Physical Therapy Treatment Patient Details Name: [name] MRN: [id number] DOB: 1928-04-17 Today's Date: 07/13/2011 Time: 1010-1025 PT Time Calculation (min): 15 min  PT Assessment / Plan / Recommendation Comments on Treatment Session  Pt continues to be anxious with mobility with R LE and refuses to have R LE in dependent position.  Wants someone to hold R LE throughout treatment at atleast 45 degree angle(hip flexion).  Pt states she is wanting to d/c home with her daughter.  No family present.  Pt reports her daughter is working on getting a ramp at her house for d/c.  Continue to feel pt would benefit from SNF prior to d/c home.    Follow Up Recommendations  Skilled nursing facility;Supervision/Assistance - 24 hour    Equipment Recommendations  Defer to next venue    Frequency Min 5X/week   Plan Discharge plan remains appropriate    Precautions / Restrictions Precautions Precautions: Fall Required Braces or Orthoses: Other Brace/Splint (Pt in R LE long leg cast) Restrictions Weight Bearing Restrictions: Yes RLE Weight Bearing: Non weight bearing   Pertinent Vitals/Pain Pt screams with R LE movement but quickly calms when still.  RN aware.    Mobility  Bed Mobility Bed Mobility: Sit to Supine Supine to Sit: 3: Mod assist;HOB elevated;With rails (30 degrees) Sitting - Scoot to Edge of Bed: 4: Min assist Sit to Supine: 3: Mod assist;With rail;HOB flat Details for Bed Mobility Assistance: assist for R LE Transfers Transfers: Sit to Stand;Stand Pivot Transfers Sit to Stand: 1: +2 Total assist;From bed;With upper extremity assist Sit to Stand: Patient Percentage: 70% Stand to Sit: 1: +2 Total assist;To bed;With upper extremity assist Stand to Sit: Patient Percentage: 70% Stand Pivot Transfers: 1: +2 Total assist Stand Pivot Transfers: Patient Percentage: 70% Details for Transfer Assistance: Pt. requires 1 person to maintain rt. LE in elevated position during transfers  and max verbal and gestural cuing for transfer technique. Ambulation/Gait Ambulation/Gait Assistance: Not tested (comment) Assistive device: None Stairs: No Wheelchair Mobility Wheelchair Mobility: No    Exercises     PT Goals Acute Rehab PT Goals Time For Goal Achievement: 07/26/11 Potential to Achieve Goals: Good PT Goal: Sit to Supine/Side - Progress: Progressing toward goal PT Goal: Sit to Stand - Progress: Progressing toward goal PT Transfer Goal: Bed to Chair/Chair to Bed - Progress: Progressing toward goal PT Goal: Ambulate - Progress: Progressing toward goal  Visit Information  Last PT Received On: 07/13/11 Assistance Needed: +2    Subjective Data  Subjective: "If you hold my leg up, I can do the rest."   Cognition  Overall Cognitive Status: Appears within functional limits for tasks assessed/performed Arousal/Alertness: Awake/alert Orientation Level: Appears intact for tasks assessed Behavior During Session: Anxious    Balance  Balance Balance Assessed: No  End of Session PT - End of Session Equipment Utilized During Treatment: Gait belt Activity Tolerance: Other (comment);Patient limited by pain (Pt limited by anxiety and pt going to HD) Patient left: in bed;with call bell/phone within reach Nurse Communication: Mobility status;Precautions;Weight bearing status    Newell Coral 07/13/2011, 1:04 PM  Newell Coral, PTA Acute Rehab [phone] (office)

## 2011-07-13 NOTE — Progress Notes (Signed)
Subjective: Patient feeling well. Vomit twice yesterday after dialysis. Denies abdominal pain. Had a BM today. She was able to tolerates breakfast today.  Objective: Filed Vitals:   07/12/11 1230 07/12/11 1300 07/12/11 2106 07/13/11 0514  BP: 207/79 155/68 117/67 138/61  Pulse: 88 89 70 59  Temp:  97.9 F (36.6 C) 99 F (37.2 C) 97.9 F (36.6 C)  TempSrc:  Oral    Resp: 20 20 18 18   Weight:  64.1 kg (141 lb 5 oz)    SpO2:  94% 96% 97%   Weight change:   Intake/Output Summary (Last 24 hours) at 07/13/11 0905 Last data filed at 07/12/11 1300  Gross per 24 hour  Intake      0 ml  Output   1600 ml  Net  -1600 ml    General: Alert, awake, oriented x3, in no acute distress.  HEENT: No bruits, no goiter.  Heart: Regular rate and rhythm, without murmurs, rubs, gallops.  Lungs: Crackles left side, bilateral air movement.  Abdomen: Soft, nontender, nondistended, positive bowel sounds.  Neuro: Grossly intact, nonfocal. Extremities; Right leg with Cast.    Lab Results:  Basename 07/12/11 0943 07/11/11 0600 07/10/11 1000  NA 137 137 --  K 4.5 5.7* --  CL 92* 96 --  CO2 28 28 --  GLUCOSE 162* 92 --  BUN 40* 22 --  CREATININE 8.15* 5.73* --  CALCIUM 8.8 9.2 --  MG -- -- 2.8*  PHOS 6.4* -- --    Basename 07/12/11 0943 07/10/11 1349  AST -- --  ALT -- 13  ALKPHOS -- --  BILITOT -- --  PROT -- --  ALBUMIN 3.1* --    Basename 07/12/11 0942 07/11/11 0600  WBC 9.7 10.3  NEUTROABS -- --  HGB 10.1* 10.9*  HCT 31.2* 34.3*  MCV 101.0* 101.5*  PLT 202 196   Basename 07/11/11 1336 07/10/11 1000  TSH -- 5.463*  T4TOTAL -- --  T3FREE 1.7* --  THYROIDAB -- --   Medications: I have reviewed the patient's current medications.  Tibia/fibula fracture (07/10/2011) Ortho recommend conservative management.  Patient will have cast today.  Methocarbamol discontinue to avoid over sedation.  Miralax to avoid constipation.  PT, OT per rotho.  Incentive spirometry at bedside.     Ortho recommend SNF.  Patient wants to go home with Home health and PT. She has good family support. Pain is better, controlled on oral medications.   Vomiting: Resolved. I added protonix.   ESRD (end stage renal disease) (07/10/2011) NSL. Renal Consulted. Patients on dialysis, M, W, F.  Patient dialysis arrange for T,THurs, Saturday.  Will ask renal if patient need to stay in hospital for dialysis tomorrow. Her next dialysis would be next Tuesday. She had dialysis yesterday.    HTN (hypertension) (07/10/2011) Continue with Norvasc, Clonidine. I will discontinue Cozaar due to hyperkalemia.  BP better.   DM2 (diabetes mellitus, type 2) (07/10/2011) Continue with insulin.  HB-A1c at 7.2. Diabetes education consulted for diet.   Hyperlipidemia (07/10/2011) Continue with Simvastatin.   Anemia (07/10/2011) Probably chronic diseases. Monitor hb.   DVT prophylaxis: Lovenox. Ortho recommend aspirin.  Increase TSH: Suspect increase secondary to acute illness. Free T4 normal, T3 low. She will need Thyroid function test in 4 weeks.      LOS: 3 days   Kendall Justo M.D.  [facility] 07/13/2011, 9:05 AM

## 2011-07-14 MED ORDER — ENOXAPARIN SODIUM 30 MG/0.3ML ~~LOC~~ SOLN: 30.0000 mg | Freq: Every day | SUBCUTANEOUS | Status: DC

## 2011-07-14 MED ORDER — PANTOPRAZOLE SODIUM 40 MG PO TBEC: 40.0000 mg | DELAYED_RELEASE_TABLET | Freq: Every day | ORAL | Status: DC

## 2011-07-14 MED ORDER — HYDROCODONE-ACETAMINOPHEN 5-325 MG PO TABS
1.0000 | ORAL_TABLET | ORAL | Status: AC | PRN
Start: 2011-07-14 — End: 2011-07-24

## 2011-07-14 MED ORDER — POLYETHYLENE GLYCOL 3350 17 G PO PACK: 17.0000 g | PACK | Freq: Two times a day (BID) | ORAL | Status: AC

## 2011-07-14 NOTE — Discharge Instructions (Signed)
Strict No Weight Bering  on the right LE

## 2011-07-14 NOTE — Discharge Summary (Signed)
Admit date: 07/10/2011 Discharge date: 07/14/2011  Primary Care Physician:  No primary provider on file.   Discharge Diagnoses:    . Tibia/fibula fracture, conservative management. 07/10/2011   . ESRD (end stage renal disease) 07/10/2011   . HTN (hypertension) 07/10/2011   . DM2 (diabetes mellitus, type 2) 07/10/2011   . Hyperlipidemia 07/10/2011   . Anemia of Chronic diseases. 07/10/2011              DISCHARGE MEDICATION: Medication List  As of 07/14/2011  9:07 AM   STOP taking these medications         furosemide 40 MG tablet      hydrocodone-acetaminophen 5-500 MG per capsule      losartan 100 MG tablet         TAKE these medications         acetaminophen 650 MG CR tablet   Commonly known as: TYLENOL   Take 650 mg by mouth every 8 (eight) hours as needed. arthritis      amLODipine 10 MG tablet   Commonly known as: NORVASC   Take 10 mg by mouth daily.      aspirin EC 81 MG tablet   Take 81 mg by mouth daily.      calcium acetate 667 MG capsule   Commonly known as: PHOSLO   Take 667 mg by mouth 3 (three) times daily with meals.      cloNIDine 0.2 MG tablet   Commonly known as: CATAPRES   Take 0.2 mg by mouth 2 (two) times daily.      enoxaparin 30 MG/0.3ML injection   Commonly known as: LOVENOX   Inject 0.3 mLs (30 mg total) into the skin daily. Follow with orthopedic for further refill.      ergocalciferol 50000 UNITS capsule   Commonly known as: VITAMIN D2   Take 50,000 Units by mouth once a week. Usually on Thursday      HYDROcodone-acetaminophen 5-325 MG per tablet   Commonly known as: NORCO   Take 1-2 tablets by mouth every 4 (four) hours as needed.      insulin NPH-insulin regular (70-30) 100 UNIT/ML injection   Commonly known as: NOVOLIN 70/30   Inject 5-10 Units into the skin 2 (two) times daily. Use 5 in the morning and 10 in the evening      isosorbide mononitrate 30 MG 24 hr tablet   Commonly known as: IMDUR   Take 30 mg by mouth daily.        pantoprazole 40 MG tablet   Commonly known as: PROTONIX   Take 1 tablet (40 mg total) by mouth daily at 12 noon.      polyethylene glycol packet   Commonly known as: MIRALAX / GLYCOLAX   Take 17 g by mouth 2 (two) times daily. For constipation as needed.      simvastatin 10 MG tablet   Commonly known as: ZOCOR   Take 10 mg by mouth at bedtime.      vitamin E 400 UNIT capsule   Take 400 Units by mouth daily.      VOL-CARE RX PO   Take 1 tablet by mouth at bedtime.              Consults: Treatment Team:  Eugenia Mcalpine, MD Garnetta Buddy, MD   SIGNIFICANT DIAGNOSTIC STUDIES:  Dg Chest 2 View  07/10/2011  *RADIOLOGY REPORT*  Clinical Data: Left tib-fib fracture for preop.  CHEST - 2 VIEW  Comparison:  07/16/2010  Findings: Borderline heart size with normal pulmonary vascularity. No focal airspace consolidation in the lungs.  No blunting of costophrenic angles.  No pneumothorax.  Postoperative changes in the right shoulder.  Calcification and tortuosity of the aorta.  IMPRESSION: No evidence of active pulmonary disease.  Original Report Authenticated By: Marlon Pel, M.D.   Dg Tibia/fibula Right  07/10/2011  *RADIOLOGY REPORT*  Clinical Data: Severe pain after fall yesterday.  RIGHT TIBIA AND FIBULA - 2 VIEW  Comparison: None.  Findings: Acute appearing spiral oblique fracture of the distal right tibial shaft with posterior lateral displacement the distal fracture fragments.  Soft tissue swelling.  Postoperative change with plate screw fixation of the distal fibula.  Oblique fracture of the proximal fibular shaft with anterior and medial displacement of the distal fracture fragments.  No focal bone lesion or bone destruction.  The calcaneus appears to be short and configuration although the trabecular architecture appears intact.  This could be due to positioning or old trauma.  If the patient is having pain in the region of the calcaneus, calcaneal views would be useful  for further evaluation.  IMPRESSION: Acute fractures of the proximal fibula and distal tibia. Postoperative changes in the distal fibula.  Original Report Authenticated By: Marlon Pel, M.D.    BRIEF ADMITTING H & P: Patient  from [facility] who had a mechanical fall at home and sustained a right tibio-fibular fractures yesterday. She went to [facility] in [place] was immobilized sent home to follow up with orthopedic . Patient lives alone since her husband died she was unable to walk or take care of herself at home. Her daughter that lives in [place] went and picked her up off and brought her over here. She is still having severe pain rated as 7/10 which has improved now with IV morphine. He has other medical problems including end-stage renal disease requiring hemodialysis on Mondays Wednesdays and Fridays type 2 diabetes hyperlipidemia as well as anemia of chronic disease. Orthopedic surgeon has been consulted here but due to her multiple medical problems she is being admitted to medicine service  Hospital Course:  Tibia/fibula fracture (07/10/2011) Patient was admitted after a mechanical fall, she has tibia fibula fracture. Ortho recommend conservative management. Patient had cast place on right leg 4-26. Strict NWB on the right LE. SNF was recommended but patient wanted to go home. Patient was started on Miralax to avoid constipation. Pain is better, controlled on oral medications. Continue with norco.   Vomiting: Resolved. Continue with protonix. Episode is usually after dialysis.   ESRD (end stage renal disease) (07/10/2011) NSL. Renal Consulted. Patient dialysis arrange for T,THurs, Saturday.  Patient had dialysis 4-27 short section.    HTN (hypertension) (07/10/2011) Continue with Norvasc, Clonidine. I will discontinue Cozaar due to hyperkalemia.  BP better.   DM2 (diabetes mellitus, type 2) (07/10/2011) Will restart home regimen.  HB-A1c at 7.2.  Diabetes education consulted for diet.  Hyperlipidemia (07/10/2011) Continue with Simvastatin.  Anemia (07/10/2011) Probably chronic diseases. Monitor hb.  DVT prophylaxis: Lovenox. She will need to follow up with ortho.  Increase TSH: Suspect increase secondary to acute illness. Free T4 normal, T3 low. She will need Thyroid function test in 4 weeks.    Disposition and Follow-up: Discharge Orders    Future Orders Please Complete By Expires   Diet - low sodium heart healthy      Increase activity slowly           DISCHARGE  EXAM:  General: Alert, awake, oriented x3, in no acute distress.  HEENT: No bruits, no goiter.  Heart: Regular rate and rhythm, without murmurs, rubs, gallops.  Lungs: CTA, bilateral air movement.  Abdomen: Soft, nontender, nondistended, positive bowel sounds.  Neuro: Grossly intact, nonfocal.  Extremities; Right leg with Cast.    Blood pressure 113/64, pulse 61, temperature 98.9 F (37.2 C), temperature source Oral, resp. rate 16, weight 64.1 kg (141 lb 5 oz), SpO2 99.00%.   Basename 07/13/11 1045 07/12/11 0943  NA 133* 137  K 4.2 4.5  CL 90* 92*  CO2 25 28  GLUCOSE 212* 162*  BUN 23 40*  CREATININE 5.20* 8.15*  CALCIUM 9.4 8.8  MG -- --  PHOS 4.2 6.4*    Basename 07/13/11 1045 07/12/11 0943  AST -- --  ALT -- --  ALKPHOS -- --  BILITOT -- --  PROT -- --  ALBUMIN 3.0* 3.1*    Basename 07/13/11 1045 07/12/11 0942  WBC 8.8 9.7  NEUTROABS -- --  HGB 9.9* 10.1*  HCT 30.8* 31.2*  MCV 100.3* 101.0*  PLT 206 202    Signed: Calais Svehla M.D. 07/14/2011, 9:07 AM

## 2011-07-14 NOTE — Progress Notes (Signed)
Physical Therapy Treatment Note   07/14/11 0926  PT Visit Information  Last PT Received On 07/14/11  Assistance Needed +2  PT Time Calculation  PT Start Time 0926  PT Stop Time 0954  PT Time Calculation (min) 28 min  Subjective Data  Subjective Pt received on [facility] with RN present. RN reports pt be going home today with daughter.  Spoke extensively with daughter, Nelida Gores, on the phone re: DME and home set up. Daughter reports to have temporary ramp placed for home entry and has 3n1 commode, RW, and w/c.  Precautions  Precautions Fall  Restrictions  RLE Weight Bearing NWB  Cognition  Overall Cognitive Status Appears within functional limits for tasks assessed/performed  Arousal/Alertness Awake/alert  Orientation Level Appears intact for tasks assessed  Behavior During Session Anxious  Bed Mobility  Bed Mobility (pt received sitting on BSC)  Transfers  Transfers Sit to Stand;Stand to Sit  Sit to Stand 3: Mod assist;With armrests (assist for R LE management)  Sit to Stand: Patient Percentage 70%  Stand to Sit 4: Min assist;With armrests  Stand to Sit: Patient Percentage 70%  Details for Transfer Assistance freq v/cs for sequencing and calm patient down. patient able to tolerate R LE lower and even maintain NWB independently  Ambulation/Gait  Ambulation/Gait Assistance 3: Mod assist  Ambulation Distance (Feet) 5 Feet  Assistive device Rolling walker  Ambulation/Gait Assistance Details max v/c's for sequencing, initial assist for R LE management but then pt able to hold it up her self.  Gait Pattern Step-to pattern  Gait velocity slow  General Gait Details pt with bilat UE fatigue  Stairs No  Wheelchair Mobility  Wheelchair Mobility No  PT - End of Session  Equipment Utilized During Treatment Gait belt  Activity Tolerance Patient limited by fatigue;Patient limited by pain  Patient left in chair;with call bell/phone within reach;with nursing in room  Nurse Communication Mobility  status;Precautions;Weight bearing status  PT - Assessment/Plan  Comments on Treatment Session Pt demonstrated significant improvement and less anxiety once she realized she could maintain R LE NWB I'ly. Spoke extensively with daughter, [name], regarding car transfer and how to fit RW to patient's height. Daughter aware of patients anxiety but also aware of patient's improved mobility and activity tolerance this date. Daughter reports to have 24/7 assist availabilty. Patient safe to return home with 24/7 assist and recommended DME.  PT Plan Frequency remains appropriate  PT Frequency Min 5X/week  Follow Up Recommendations Home health PT;Supervision/Assistance - 24 hour  Equipment Recommended None recommended by PT (family has all DME)  Acute Rehab PT Goals  Time For Goal Achievement 07/26/11  Potential to Achieve Goals Good  PT Goal: Sit to Stand - Progress Progressing toward goal  PT Transfer Goal: Bed to Chair/Chair to Bed - Progress Progressing toward goal  PT Goal: Ambulate - Progress Progressing toward goal    Pain: 5/10 R LE pain in dependent position  Lewis Shock, PT, DPT Pager #: [phone] Office #: [phone]

## 2011-07-14 NOTE — Progress Notes (Signed)
[facility] ROUNDING NOTE   Subjective:   Interval History: none.  Objective:  Vital signs in last 24 hours:  Temp:  [97.7 F (36.5 C)-98.9 F (37.2 C)] 98.9 F (37.2 C) (04/28 0534) Pulse Rate:  [60-83] 61  (04/28 0534) Resp:  [16-18] 16  (04/28 0534) BP: (113-201)/(45-77) 113/64 mmHg (04/28 0534) SpO2:  [94 %-99 %] 99 % (04/28 0534)  Weight change:  Filed Weights   07/10/11 1828 07/12/11 0855 07/12/11 1300  Weight: 65.6 kg (144 lb 10 oz) 67 kg (147 lb 11.3 oz) 64.1 kg (141 lb 5 oz)    Intake/Output: I/O last 3 completed shifts: In: 240 [P.O.:240] Out: 1340 [Other:1340]   Intake/Output this shift:  Total I/O In: 240 [P.O.:240] Out: -   CVS- RRR RS- CTA ABD- BS present soft non-distended EXT- no edema   Basic Metabolic Panel:  Lab 07/13/11 4098 07/12/11 0943 07/11/11 0600 07/10/11 1000 07/10/11 0317  NA 133* 137 137 -- 142  K 4.2 4.5 5.7* -- 4.8  CL 90* 92* 96 -- 98  CO2 25 28 28  -- 27  GLUCOSE 212* 162* 92 -- 176*  BUN 23 40* 22 -- 32*  CREATININE 5.20* 8.15* 5.73* 8.24* 8.04*  CALCIUM 9.4 8.8 9.2 -- --  MG -- -- -- 2.8* --  PHOS 4.2 6.4* -- -- --    Liver Function Tests:  Lab 07/13/11 1045 07/12/11 0943 07/10/11 1349  AST -- -- --  ALT -- -- 13  ALKPHOS -- -- --  BILITOT -- -- --  PROT -- -- --  ALBUMIN 3.0* 3.1* --   No results found for this basename: LIPASE:5,AMYLASE:5 in the last 168 hours No results found for this basename: AMMONIA:3 in the last 168 hours  CBC:  Lab 07/13/11 1045 07/12/11 0942 07/11/11 0600 07/10/11 1000 07/10/11 0317  WBC 8.8 9.7 10.3 9.9 10.2  NEUTROABS -- -- -- -- --  HGB 9.9* 10.1* 10.9* 11.5* 11.0*  HCT 30.8* 31.2* 34.3* 35.7* 34.6*  MCV 100.3* 101.0* 101.5* 102.9* 101.2*  PLT 206 202 196 227 235    Cardiac Enzymes: No results found for this basename: CKTOTAL:5,CKMB:5,CKMBINDEX:5,TROPONINI:5 in the last 168 hours  BNP: No components found with this basename: POCBNP:5  CBG:  Lab 07/14/11  0627 07/13/11 2132 07/13/11 1623 07/13/11 0641 07/12/11 2110  GLUCAP 174* 138* 139* 134* 206*    Microbiology: Results for orders placed during the hospital encounter of 07/14/10  MRSA PCR SCREENING     Status: Normal   Collection Time   07/14/10  9:00 PM      Component Value Range Status Comment   MRSA by PCR    NEGATIVE  Final    Value: NEGATIVE            The GeneXpert MRSA Assay (FDA     approved for NASAL specimens     only), is one component of a     comprehensive MRSA colonization     surveillance program. It is not     intended to diagnose MRSA     infection nor to guide or     monitor treatment for     MRSA infections.    Coagulation Studies: No results found for this basename: LABPROT:5,INR:5 in the last 72 hours  Urinalysis: No results found for this basename: COLORURINE:2,APPERANCEUR:2,LABSPEC:2,PHURINE:2,GLUCOSEU:2,HGBUR:2,BILIRUBINUR:2,KETONESUR:2,PROTEINUR:2,UROBILINOGEN:2,NITRITE:2,LEUKOCYTESUR:2 in the last 72 hours    Imaging: No results found.   Medications:        . amLODipine  10 mg Oral Daily  .  aspirin EC  81 mg Oral Daily  . calcium acetate  667 mg Oral TID WC  . cloNIDine  0.2 mg Oral BID  . enoxaparin  30 mg Subcutaneous Daily  . feeding supplement (NEPRO CARB STEADY)  237 mL Oral Daily  . HYDROmorphone      . insulin aspart  0-9 Units Subcutaneous TID WC  . insulin aspart  5 Units Subcutaneous BID WC  . isosorbide mononitrate  30 mg Oral Daily  . pantoprazole  40 mg Oral Q1200  . polyethylene glycol  17 g Oral BID  . simvastatin  10 mg Oral QHS  . Vitamin D (Ergocalciferol)  50,000 Units Oral Weekly  . vitamin E  400 Units Oral Daily   sodium chloride, sodium chloride, acetaminophen, acetaminophen, feeding supplement (NEPRO CARB STEADY), heparin, hydrALAZINE, HYDROcodone-acetaminophen, HYDROmorphone, lidocaine, lidocaine-prilocaine, ondansetron (ZOFRAN) IV, ondansetron, pentafluoroprop-tetrafluoroeth, DISCONTD: heparin, DISCONTD:  heparin, DISCONTD: heparin  Assessment/ Plan:  Pt is a 78 y.o. yo female with a PMHX of ESRD was admitted to [facility] on 07/10/2011 with fractured tibia.   1.ESRD Dialysis MWF will be sent home on outpatient schedule TTS.  2. HTN/Vol looks pretty controlled 3.Anemia Hb above 11  4. Bones stable follow PTH  5. Pain Avoid over sedation   Patients discharge cancelled yesterday. Outpatient slot for TTS   LOS: 4 [name] W @TODAY @8 :58 AM

## 2011-07-15 NOTE — Progress Notes (Signed)
   CARE MANAGEMENT NOTE 07/15/2011  Patient:  [name], [name]   Account Number:  [id number]  Date Initiated:  07/10/2011  Documentation initiated by:  Alvira Philips Assessment:   78 yr-old female adm with tib-fib fx; lives alone, independent PTA.     Action/Plan:   Anticipated DC Date:  07/14/2011   Anticipated DC Plan:  HOME W HOME HEALTH SERVICES      DC Planning Services  CM consult      Choice offered to / List presented to:          [facility] arranged  HH-2 PT      [facility] agency  [facility]   Status of service:   Medicare Important Message given?   (If response is "NO", the following Medicare IM given date fields will be blank) Date Medicare IM given:   Date Additional Medicare IM given:    Discharge Disposition:  HOME W HOME HEALTH SERVICES  Per UR Regulation:    If discussed at Long Length of Stay Meetings, dates discussed:    Comments:  PCP:  Dr. Lovett Sox  Contact:  Massie Maroon, dtr  [phone]

## 2011-07-16 NOTE — Telephone Encounter (Signed)
Daughter aware.  Appointment on May 17.

## 2011-08-02 ENCOUNTER — Ambulatory Visit (INDEPENDENT_AMBULATORY_CARE_PROVIDER_SITE_OTHER): Payer: Medicare Other | Admitting: Internal Medicine

## 2011-08-02 ENCOUNTER — Encounter: Payer: Self-pay | Admitting: Internal Medicine

## 2011-08-02 VITALS — BP 112/82 | HR 72 | Temp 98.3°F | Ht 61.0 in | Wt 140.0 lb

## 2011-08-02 DIAGNOSIS — E039 Hypothyroidism, unspecified: Secondary | ICD-10-CM

## 2011-08-02 DIAGNOSIS — S82209A Unspecified fracture of shaft of unspecified tibia, initial encounter for closed fracture: Secondary | ICD-10-CM

## 2011-08-02 DIAGNOSIS — Z923 Personal history of irradiation: Secondary | ICD-10-CM | POA: Insufficient documentation

## 2011-08-02 DIAGNOSIS — N186 End stage renal disease: Secondary | ICD-10-CM

## 2011-08-02 DIAGNOSIS — E119 Type 2 diabetes mellitus without complications: Secondary | ICD-10-CM

## 2011-08-02 DIAGNOSIS — I10 Essential (primary) hypertension: Secondary | ICD-10-CM

## 2011-08-02 DIAGNOSIS — S82409A Unspecified fracture of shaft of unspecified fibula, initial encounter for closed fracture: Secondary | ICD-10-CM

## 2011-08-02 NOTE — Assessment & Plan Note (Signed)
Accidental fall 4/23 -  Casted tib-fib fx - ongoing follow up with Dr Thomasena Edis ortho Next follow up 08/21/11  Not requiring pains meds at this time

## 2011-08-02 NOTE — Patient Instructions (Signed)
It was good to see you today. We have reviewed your prior records including labs and tests today Medications reviewed, no changes at this time. Will complete FMLA forms for your family as requested - we will call when these are ready for pick up Please schedule followup in 3-4 months for diabetes mellitus check, call sooner if problems.

## 2011-08-02 NOTE — Assessment & Plan Note (Signed)
Lab Results  Component Value Date   TSH 5.463* 07/10/2011   The current medical regimen is effective;  continue present plan and medications.

## 2011-08-02 NOTE — Assessment & Plan Note (Signed)
Takes 75/25 at home, not 70/30 Lab Results  Component Value Date   HGBA1C 7.2* 07/10/2011   The current medical regimen is effective;  continue present plan and medications.

## 2011-08-04 ENCOUNTER — Encounter: Payer: Self-pay | Admitting: Internal Medicine

## 2011-08-04 NOTE — Assessment & Plan Note (Signed)
BP Readings from Last 3 Encounters:  08/02/11 112/82  07/14/11 113/64   The current medical regimen is effective;  continue present plan and medications.

## 2011-08-04 NOTE — Progress Notes (Signed)
  Subjective:    Patient ID: [name], female    DOB: Feb 27, 1929, 78 y.o.   MRN: [id number]  HPI New pt to me and our practice, here to establish care Lives in [place], but staying with [place] dtr until recovered from ortho injuries  reviewed ongoing medical issues: R tib-fib fx - s/p accidental fall 07/10/11 - casted and following with ortho for same - no other injury. Denies pain or need for ongoing pain control  DM2, insuline depression - checks cbgs 3x/day - denies hypoglycemia symptoms - the patient reports compliance with medication(s) as prescribed. Denies adverse side effects.  ESRD - ongoing HD TTS in [place] -   Hypothyroid -the patient reports compliance with medication(s) as prescribed. Denies adverse side effects.  Past Medical History  Diagnosis Date  . CHF (congestive heart failure)   . DM2 (diabetes mellitus, type 2)   . ESRD (end stage renal disease)     HD TTS - [facility]  . HTN (hypertension)   . Hyperlipidemia   . Tibia/fibula fracture 07/10/2011    right   . Hypothyroid     Review of Systems  Constitutional: Negative for appetite change and fatigue.  Respiratory: Negative for cough and shortness of breath.   Cardiovascular: Negative for chest pain, palpitations and leg swelling.  Neurological: Negative for dizziness, light-headedness and headaches.       Objective:   Physical Exam BP 112/82  Pulse 72  Temp(Src) 98.3 F (36.8 C) (Oral)  Ht 5\' 1"  (1.549 m)  Wt 140 lb (63.504 kg)  BMI 26.45 kg/m2  SpO2 96% Constitutional: She appears well-developed and well-nourished. No distress. dtrs at side Neck: Normal range of motion. Neck supple. No JVD present. No thyromegaly present.  Cardiovascular: Normal rate, regular rhythm and normal heart sounds.  No murmur heard. No BLE edema. Pulmonary/Chest: Effort normal and breath sounds normal. No respiratory distress. She has no wheezes.  Musculoskeletal: RLE casted from mid thigh to mid foot. Otherwise, normal  range of motion, no joint effusions. No gross deformities Neurological: She is alert and oriented to person, place, and time. No cranial nerve deficit. Coordination normal.  Skin: Skin is warm and dry. No rash noted. No erythema.  Psychiatric: She has a normal mood and affect. Her behavior is normal. Judgment and thought content normal.   Lab Results  Component Value Date   WBC 8.8 07/13/2011   HGB 9.9* 07/13/2011   HCT 30.8* 07/13/2011   PLT 206 07/13/2011   GLUCOSE 212* 07/13/2011   ALT 13 07/10/2011   AST 20 08/27/2009   NA 133* 07/13/2011   K 4.2 07/13/2011   CL 90* 07/13/2011   CREATININE 5.20* 07/13/2011   BUN 23 07/13/2011   CO2 25 07/13/2011   TSH 5.463* 07/10/2011   HGBA1C 7.2* 07/10/2011        Assessment & Plan:  See problem list. Medications and labs reviewed today. Time spent with pt/family today 30 minutes, greater than 50% time spent counseling patient on recent hospitalization for ortho injury, chronic med issues including diabetes, and medication review. Also review of prior records

## 2011-08-04 NOTE — Assessment & Plan Note (Signed)
In [place], HD tts - follows with renal for same The current medical regimen is effective;  continue present plan and medications.

## 2011-08-07 DIAGNOSIS — Z0279 Encounter for issue of other medical certificate: Secondary | ICD-10-CM

## 2011-09-09 ENCOUNTER — Other Ambulatory Visit: Payer: Self-pay | Admitting: Internal Medicine

## 2011-11-01 ENCOUNTER — Ambulatory Visit: Payer: Medicare Other | Admitting: Internal Medicine

## 2012-04-13 ENCOUNTER — Other Ambulatory Visit (HOSPITAL_COMMUNITY): Payer: Self-pay | Admitting: Specialist

## 2012-04-13 DIAGNOSIS — S82409A Unspecified fracture of shaft of unspecified fibula, initial encounter for closed fracture: Secondary | ICD-10-CM

## 2012-04-13 DIAGNOSIS — S82209A Unspecified fracture of shaft of unspecified tibia, initial encounter for closed fracture: Secondary | ICD-10-CM

## 2012-04-14 ENCOUNTER — Ambulatory Visit (HOSPITAL_COMMUNITY)
Admission: RE | Admit: 2012-04-14 | Discharge: 2012-04-14 | Disposition: A | Discharge status: Home / Self Care | Payer: Medicare Other | Source: Ambulatory Visit | Attending: Specialist | Admitting: Specialist

## 2012-04-14 DIAGNOSIS — S82209A Unspecified fracture of shaft of unspecified tibia, initial encounter for closed fracture: Secondary | ICD-10-CM | POA: Insufficient documentation

## 2012-04-14 DIAGNOSIS — S82409A Unspecified fracture of shaft of unspecified fibula, initial encounter for closed fracture: Secondary | ICD-10-CM

## 2012-04-14 DIAGNOSIS — M810 Age-related osteoporosis without current pathological fracture: Secondary | ICD-10-CM | POA: Insufficient documentation

## 2012-04-14 DIAGNOSIS — X58XXXA Exposure to other specified factors, initial encounter: Secondary | ICD-10-CM | POA: Insufficient documentation

## 2012-05-27 ENCOUNTER — Other Ambulatory Visit (HOSPITAL_COMMUNITY): Payer: Self-pay | Admitting: *Deleted

## 2012-05-27 DIAGNOSIS — M25569 Pain in unspecified knee: Secondary | ICD-10-CM

## 2012-05-27 DIAGNOSIS — N186 End stage renal disease: Secondary | ICD-10-CM

## 2012-05-28 ENCOUNTER — Ambulatory Visit (HOSPITAL_COMMUNITY): Payer: Medicare Other

## 2012-05-28 ENCOUNTER — Ambulatory Visit (HOSPITAL_COMMUNITY)
Admission: RE | Admit: 2012-05-28 | Discharge: 2012-05-28 | Disposition: A | Discharge status: Home / Self Care | Payer: Medicare Other | Source: Ambulatory Visit | Attending: Orthopedic Surgery | Admitting: Orthopedic Surgery

## 2012-05-28 DIAGNOSIS — M79609 Pain in unspecified limb: Secondary | ICD-10-CM | POA: Insufficient documentation

## 2012-05-28 DIAGNOSIS — N186 End stage renal disease: Secondary | ICD-10-CM

## 2012-05-28 DIAGNOSIS — E119 Type 2 diabetes mellitus without complications: Secondary | ICD-10-CM | POA: Insufficient documentation

## 2012-05-28 DIAGNOSIS — Z01810 Encounter for preprocedural cardiovascular examination: Secondary | ICD-10-CM

## 2012-05-28 DIAGNOSIS — M25569 Pain in unspecified knee: Secondary | ICD-10-CM

## 2012-05-28 DIAGNOSIS — IMO0002 Reserved for concepts with insufficient information to code with codable children: Secondary | ICD-10-CM | POA: Insufficient documentation

## 2012-05-28 DIAGNOSIS — Z992 Dependence on renal dialysis: Secondary | ICD-10-CM | POA: Insufficient documentation

## 2012-05-28 DIAGNOSIS — S8290XS Unspecified fracture of unspecified lower leg, sequela: Secondary | ICD-10-CM | POA: Insufficient documentation

## 2012-05-28 DIAGNOSIS — X58XXXS Exposure to other specified factors, sequela: Secondary | ICD-10-CM | POA: Insufficient documentation

## 2012-05-28 NOTE — Progress Notes (Signed)
VASCULAR LAB PRELIMINARY  ARTERIAL  AABI completed:    RIGHT    LEFT    PRESSURE WAVEFORM  PRESSURE WAVEFORM  BRACHIAL AV fistula Triphasic BRACHIAL 161 Triphasic  DP 194 Biphasic DP 196 Biphasic         PT 182 Biphasic PT 202 Triphasic                  RIGHT LEFT  ABI 1.20 1.25   ABIs and Doppler waveforms are within normal limits bilaterally at rest.  Ashlynne Shetterly, RVS 05/28/2012, 11:09 AM

## 2012-06-15 ENCOUNTER — Encounter (HOSPITAL_COMMUNITY): Payer: Self-pay | Admitting: *Deleted

## 2012-06-15 MED ORDER — CEFAZOLIN SODIUM-DEXTROSE 2-3 GM-% IV SOLR
2.0000 g | INTRAVENOUS | Status: AC
  Administered 2012-06-16: 2 g via INTRAVENOUS
  Filled 2012-06-15: qty 50

## 2012-06-15 NOTE — H&P (Signed)
[facility] H&P  Chief Complaint: R tibia nonunion   HPI:               79 y/o black female with ESRD on dialysis 3 days/week, sustained a closed R distal 1/3 tibial shaft fracture in April of 2013.  Pt was treated non-operatively with numerous modalities including splint, cast and cam boot with bone stim.  Pt has had continued pain at her fracture site. She was referred to the OTS for definitive tx of her nonunion. Pt agreeable to operative intervention at this point in time. Dialysis was performed on 06/15/2012 pre-surgery    Past Medical History   Diagnosis  Date   .  CHF (congestive heart failure)     .  DM2 (diabetes mellitus, type 2)     .  ESRD (end stage renal disease)         HD TTS - [facility]   .  HTN (hypertension)     .  Hyperlipidemia     .  Tibia/fibula fracture  07/10/2011       right    .  Hypothyroid     .  Cancer         Breast   .  Arthritis     .  History of blood transfusion         Past Surgical History   Procedure  Laterality  Date   .  Mastectomy       .  Abdominal surgery       .  Thyroid surgery       .  Breast surgery  Left         Mastectomy   .  Eye surgery  Bilateral         Cataract   .  Ankle fusion  Right     .  Arteriovenous graft placement  Right         Family History   Problem  Relation  Age of Onset   .  Hyperlipidemia  Father     .  Diabetes  Father     .  Breast cancer  Sister      Social History: reports that she has never smoked. Her smokeless tobacco use includes Snuff. She reports that she does not drink alcohol or use illicit drugs.  Allergies:   Allergies   Allergen  Reactions   .  Codeine       Home Meds             Synthroid, Cozaar, Zocor, Imdur, Humalog, vitamin D, calcium, Clonidine, Norvasc  Labs to be drawn   Review of Systems  Constitutional: Negative for fever and chills.  Respiratory: Negative for shortness of breath.   Cardiovascular: Negative for chest pain and palpitations.   Gastrointestinal: Negative for nausea, vomiting and abdominal pain.  Musculoskeletal:        R lower leg pain  Neurological: Negative for headaches.    AF VSS Physical Exam  Constitutional: She is oriented to person, place, and time. She is cooperative. No distress.  Pleasant 78 y/o female  HENT:   Head: Normocephalic and atraumatic.  Eyes: EOM are normal.  Cardiovascular:  s1 and s2  Respiratory:  Clear B  GI:  + BS, NT  Musculoskeletal:  Right Leg   Swelling to anterolateral  Ankle   TTP R distal tibia    Ankle ROM 10 extension, 15 flexion    DPN, SPN, TN sensation intact  EHL, FHL, AT, PT, Peroneals, Gastroc motor intact    + DP pulse    Ext is cool     Skin appropriate color    Compartments soft and NT    Knee exam unremarkable      Neurological: She is alert and oriented to person, place, and time.  Skin: Skin is intact.     ABI    Summary: ABIs and Doppler waveforms are within normal limits bilaterally at rest.  Assessment and Plan  OR for ORIF R tibia nonunion, augmented with biologics Admit after surgery for pain control and observation Renal consult post op Pt remains at risk for persistent nonunion and infection due to medical comorbidities  Risk and benefits were reviewed with pt and daughter they elect to proceed with surgery  Mearl Latin, PA-C [facility] [phone] (P) 06/15/2012 10:23 PM

## 2012-06-16 ENCOUNTER — Encounter (HOSPITAL_COMMUNITY): Payer: Self-pay | Admitting: Certified Registered"

## 2012-06-16 ENCOUNTER — Inpatient Hospital Stay (HOSPITAL_COMMUNITY): Payer: Medicare Other

## 2012-06-16 ENCOUNTER — Inpatient Hospital Stay (HOSPITAL_COMMUNITY)
Admission: RE | Admit: 2012-06-16 | Discharge: 2012-06-19 | DRG: 492 | Disposition: A | Discharge status: Rehabilitation Facility | Payer: Medicare Other | Source: Ambulatory Visit | Attending: Orthopedic Surgery | Admitting: Orthopedic Surgery

## 2012-06-16 ENCOUNTER — Encounter (HOSPITAL_COMMUNITY)
Admission: RE | Disposition: A | Discharge status: Rehabilitation Facility | Payer: Self-pay | Source: Ambulatory Visit | Attending: Orthopedic Surgery

## 2012-06-16 ENCOUNTER — Inpatient Hospital Stay (HOSPITAL_COMMUNITY): Payer: Medicare Other | Admitting: Certified Registered"

## 2012-06-16 DIAGNOSIS — IMO0002 Reserved for concepts with insufficient information to code with codable children: Principal | ICD-10-CM | POA: Diagnosis present

## 2012-06-16 DIAGNOSIS — E119 Type 2 diabetes mellitus without complications: Secondary | ICD-10-CM | POA: Diagnosis present

## 2012-06-16 DIAGNOSIS — Z794 Long term (current) use of insulin: Secondary | ICD-10-CM

## 2012-06-16 DIAGNOSIS — M898X9 Other specified disorders of bone, unspecified site: Secondary | ICD-10-CM | POA: Diagnosis present

## 2012-06-16 DIAGNOSIS — Z853 Personal history of malignant neoplasm of breast: Secondary | ICD-10-CM

## 2012-06-16 DIAGNOSIS — I120 Hypertensive chronic kidney disease with stage 5 chronic kidney disease or end stage renal disease: Secondary | ICD-10-CM | POA: Diagnosis present

## 2012-06-16 DIAGNOSIS — I5030 Unspecified diastolic (congestive) heart failure: Secondary | ICD-10-CM | POA: Diagnosis present

## 2012-06-16 DIAGNOSIS — Z923 Personal history of irradiation: Secondary | ICD-10-CM | POA: Diagnosis present

## 2012-06-16 DIAGNOSIS — Z992 Dependence on renal dialysis: Secondary | ICD-10-CM

## 2012-06-16 DIAGNOSIS — E1149 Type 2 diabetes mellitus with other diabetic neurological complication: Secondary | ICD-10-CM | POA: Diagnosis present

## 2012-06-16 DIAGNOSIS — E785 Hyperlipidemia, unspecified: Secondary | ICD-10-CM | POA: Diagnosis present

## 2012-06-16 DIAGNOSIS — E039 Hypothyroidism, unspecified: Secondary | ICD-10-CM | POA: Diagnosis present

## 2012-06-16 DIAGNOSIS — D649 Anemia, unspecified: Secondary | ICD-10-CM | POA: Diagnosis present

## 2012-06-16 DIAGNOSIS — I10 Essential (primary) hypertension: Secondary | ICD-10-CM | POA: Diagnosis present

## 2012-06-16 DIAGNOSIS — I509 Heart failure, unspecified: Secondary | ICD-10-CM | POA: Diagnosis present

## 2012-06-16 DIAGNOSIS — F17200 Nicotine dependence, unspecified, uncomplicated: Secondary | ICD-10-CM | POA: Diagnosis present

## 2012-06-16 DIAGNOSIS — N186 End stage renal disease: Secondary | ICD-10-CM | POA: Diagnosis present

## 2012-06-16 DIAGNOSIS — M129 Arthropathy, unspecified: Secondary | ICD-10-CM | POA: Diagnosis present

## 2012-06-16 DIAGNOSIS — E1142 Type 2 diabetes mellitus with diabetic polyneuropathy: Secondary | ICD-10-CM | POA: Diagnosis present

## 2012-06-16 DIAGNOSIS — M9080 Osteopathy in diseases classified elsewhere, unspecified site: Secondary | ICD-10-CM | POA: Diagnosis present

## 2012-06-16 DIAGNOSIS — N2581 Secondary hyperparathyroidism of renal origin: Secondary | ICD-10-CM | POA: Diagnosis present

## 2012-06-16 HISTORY — PX: ORIF TIBIA FRACTURE: SHX5416

## 2012-06-16 HISTORY — DX: Secondary hyperparathyroidism of renal origin: N25.81

## 2012-06-16 LAB — CBC WITH DIFFERENTIAL/PLATELET
Basophils Relative: 1 % (ref 0–1)
Eosinophils Absolute: 0.4 10*3/uL (ref 0.0–0.7)
Eosinophils Relative: 6 % — ABNORMAL HIGH (ref 0–5)
Lymphs Abs: 1.1 10*3/uL (ref 0.7–4.0)
MCH: 31.3 pg (ref 26.0–34.0)
MCHC: 32.1 g/dL (ref 30.0–36.0)
MCV: 97.4 fL (ref 78.0–100.0)
Platelets: 317 10*3/uL (ref 150–400)
RBC: 3.42 MIL/uL — ABNORMAL LOW (ref 3.87–5.11)
RDW: 16 % — ABNORMAL HIGH (ref 11.5–15.5)

## 2012-06-16 LAB — TYPE AND SCREEN
ABO/RH(D): A POS
Antibody Screen: NEGATIVE

## 2012-06-16 LAB — GRAM STAIN

## 2012-06-16 LAB — URINE MICROSCOPIC-ADD ON

## 2012-06-16 LAB — COMPREHENSIVE METABOLIC PANEL
ALT: 9 U/L (ref 0–35)
Calcium: 8.3 mg/dL — ABNORMAL LOW (ref 8.4–10.5)
GFR calc Af Amer: 6 mL/min — ABNORMAL LOW (ref >90)
Glucose, Bld: 148 mg/dL — ABNORMAL HIGH (ref 70–99)
Sodium: 145 mEq/L (ref 135–145)
Total Protein: 7 g/dL (ref 6.0–8.3)

## 2012-06-16 LAB — URINALYSIS, ROUTINE W REFLEX MICROSCOPIC
Ketones, ur: NEGATIVE mg/dL
Nitrite: NEGATIVE
Protein, ur: 100 mg/dL — AB
pH: 6 (ref 5.0–8.0)

## 2012-06-16 LAB — APTT: aPTT: 51 seconds — ABNORMAL HIGH (ref 24–37)

## 2012-06-16 LAB — SURGICAL PCR SCREEN
MRSA, PCR: NEGATIVE
Staphylococcus aureus: POSITIVE — AB

## 2012-06-16 LAB — PROTIME-INR: Prothrombin Time: 13.5 seconds (ref 11.6–15.2)

## 2012-06-16 LAB — GLUCOSE, CAPILLARY: Glucose-Capillary: 86 mg/dL (ref 70–99)

## 2012-06-16 SURGERY — OPEN REDUCTION INTERNAL FIXATION (ORIF) TIBIA FRACTURE
Anesthesia: General | Site: Ankle | Laterality: Right | Wound class: Clean

## 2012-06-16 MED ORDER — PROPOFOL 10 MG/ML IV BOLUS
INTRAVENOUS | Status: DC | PRN
  Administered 2012-06-16: 100 mg via INTRAVENOUS

## 2012-06-16 MED ORDER — LIDOCAINE-PRILOCAINE 2.5-2.5 % EX CREA
1.0000 | TOPICAL_CREAM | CUTANEOUS | Status: DC | PRN
Start: 2012-06-16 — End: 2012-06-17
  Filled 2012-06-16: qty 5

## 2012-06-16 MED ORDER — PENTAFLUOROPROP-TETRAFLUOROETH EX AERO: 1.0000 "application " | INHALATION_SPRAY | CUTANEOUS | Status: DC | PRN

## 2012-06-16 MED ORDER — ENOXAPARIN SODIUM 30 MG/0.3ML ~~LOC~~ SOLN
30.0000 mg | SUBCUTANEOUS | Status: DC
  Administered 2012-06-17 – 2012-06-18 (×3): 30 mg via SUBCUTANEOUS
  Filled 2012-06-16 (×4): qty 0.3

## 2012-06-16 MED ORDER — HYDROMORPHONE HCL PF 1 MG/ML IJ SOLN
INTRAMUSCULAR | Status: AC
  Administered 2012-06-16: 0.25 mg via INTRAVENOUS
  Filled 2012-06-16: qty 1

## 2012-06-16 MED ORDER — ONDANSETRON HCL 4 MG/2ML IJ SOLN
4.0000 mg | Freq: Four times a day (QID) | INTRAMUSCULAR | Status: DC | PRN
  Filled 2012-06-16: qty 2

## 2012-06-16 MED ORDER — BISACODYL 10 MG RE SUPP: 10.0000 mg | Freq: Every day | RECTAL | Status: DC | PRN

## 2012-06-16 MED ORDER — HEPARIN SODIUM (PORCINE) 1000 UNIT/ML DIALYSIS: 1000.0000 [IU] | INTRAMUSCULAR | Status: DC | PRN

## 2012-06-16 MED ORDER — SENNOSIDES-DOCUSATE SODIUM 8.6-50 MG PO TABS: 1.0000 | ORAL_TABLET | Freq: Every evening | ORAL | Status: DC | PRN

## 2012-06-16 MED ORDER — METOCLOPRAMIDE HCL 5 MG PO TABS
5.0000 mg | ORAL_TABLET | Freq: Three times a day (TID) | ORAL | Status: DC | PRN
  Filled 2012-06-16: qty 2

## 2012-06-16 MED ORDER — PANTOPRAZOLE SODIUM 40 MG PO TBEC
40.0000 mg | DELAYED_RELEASE_TABLET | Freq: Every day | ORAL | Status: DC
  Administered 2012-06-18 – 2012-06-19 (×2): 40 mg via ORAL
  Filled 2012-06-16 (×2): qty 1

## 2012-06-16 MED ORDER — LIDOCAINE HCL (PF) 1 % IJ SOLN: 5.0000 mL | INTRAMUSCULAR | Status: DC | PRN

## 2012-06-16 MED ORDER — MUPIROCIN 2 % EX OINT
TOPICAL_OINTMENT | CUTANEOUS | Status: AC
  Filled 2012-06-16: qty 22

## 2012-06-16 MED ORDER — DOXERCALCIFEROL 4 MCG/2ML IV SOLN
3.0000 ug | INTRAVENOUS | Status: DC
  Administered 2012-06-17 – 2012-06-19 (×2): 3 ug via INTRAVENOUS
  Filled 2012-06-16 (×2): qty 2

## 2012-06-16 MED ORDER — HYDROMORPHONE HCL PF 1 MG/ML IJ SOLN
INTRAMUSCULAR | Status: AC
  Filled 2012-06-16: qty 1

## 2012-06-16 MED ORDER — DOCUSATE SODIUM 100 MG PO CAPS
100.0000 mg | ORAL_CAPSULE | Freq: Two times a day (BID) | ORAL | Status: DC
  Administered 2012-06-17 – 2012-06-19 (×7): 100 mg via ORAL
  Filled 2012-06-16 (×7): qty 1

## 2012-06-16 MED ORDER — ONDANSETRON HCL 4 MG/2ML IJ SOLN
4.0000 mg | Freq: Four times a day (QID) | INTRAMUSCULAR | Status: DC | PRN
  Administered 2012-06-16: 4 mg via INTRAVENOUS

## 2012-06-16 MED ORDER — ACETAMINOPHEN 10 MG/ML IV SOLN
1000.0000 mg | Freq: Four times a day (QID) | INTRAVENOUS | Status: AC
  Administered 2012-06-16 – 2012-06-17 (×3): 1000 mg via INTRAVENOUS
  Filled 2012-06-16 (×5): qty 100

## 2012-06-16 MED ORDER — SODIUM CHLORIDE 0.9 % IV SOLN
INTRAVENOUS | Status: DC | PRN
  Administered 2012-06-16 (×2): via INTRAVENOUS

## 2012-06-16 MED ORDER — INSULIN ASPART 100 UNIT/ML ~~LOC~~ SOLN
0.0000 [IU] | Freq: Three times a day (TID) | SUBCUTANEOUS | Status: DC
  Administered 2012-06-17: 5 [IU] via SUBCUTANEOUS

## 2012-06-16 MED ORDER — SODIUM CHLORIDE 0.9 % IV SOLN
INTRAVENOUS | Status: DC
  Administered 2012-06-17: 10:00:00 via INTRAVENOUS

## 2012-06-16 MED ORDER — MORPHINE SULFATE 2 MG/ML IJ SOLN: 1.0000 mg | INTRAMUSCULAR | Status: DC | PRN

## 2012-06-16 MED ORDER — CEFAZOLIN SODIUM 1-5 GM-% IV SOLN
1.0000 g | Freq: Once | INTRAVENOUS | Status: AC
  Administered 2012-06-17: 1 g via INTRAVENOUS
  Filled 2012-06-16: qty 50

## 2012-06-16 MED ORDER — CALCIUM ACETATE 667 MG PO CAPS
667.0000 mg | ORAL_CAPSULE | Freq: Three times a day (TID) | ORAL | Status: DC
  Administered 2012-06-16 – 2012-06-19 (×7): 667 mg via ORAL
  Filled 2012-06-16 (×11): qty 1

## 2012-06-16 MED ORDER — ONDANSETRON HCL 4 MG PO TABS: 4.0000 mg | ORAL_TABLET | Freq: Four times a day (QID) | ORAL | Status: DC | PRN

## 2012-06-16 MED ORDER — NEOSTIGMINE METHYLSULFATE 1 MG/ML IJ SOLN
INTRAMUSCULAR | Status: DC | PRN
  Administered 2012-06-16: 3 mg via INTRAVENOUS

## 2012-06-16 MED ORDER — PROMETHAZINE HCL 25 MG/ML IJ SOLN
INTRAMUSCULAR | Status: AC
  Administered 2012-06-16: 6.25 mg via INTRAVENOUS
  Filled 2012-06-16: qty 1

## 2012-06-16 MED ORDER — NEPRO/CARBSTEADY PO LIQD
237.0000 mL | ORAL | Status: DC | PRN
  Filled 2012-06-16: qty 237

## 2012-06-16 MED ORDER — GLYCOPYRROLATE 0.2 MG/ML IJ SOLN
INTRAMUSCULAR | Status: DC | PRN
  Administered 2012-06-16: 0.4 mg via INTRAVENOUS
  Administered 2012-06-16: 0.2 mg via INTRAVENOUS

## 2012-06-16 MED ORDER — MUPIROCIN 2 % EX OINT: TOPICAL_OINTMENT | Freq: Once | CUTANEOUS | Status: DC

## 2012-06-16 MED ORDER — LIDOCAINE HCL (CARDIAC) 20 MG/ML IV SOLN
INTRAVENOUS | Status: DC | PRN
  Administered 2012-06-16: 100 mg via INTRAVENOUS

## 2012-06-16 MED ORDER — VITAMIN D (ERGOCALCIFEROL) 1.25 MG (50000 UNIT) PO CAPS
50000.0000 [IU] | ORAL_CAPSULE | ORAL | Status: DC
  Administered 2012-06-19: 50000 [IU] via ORAL
  Filled 2012-06-16: qty 1

## 2012-06-16 MED ORDER — DARBEPOETIN ALFA-POLYSORBATE 40 MCG/0.4ML IJ SOLN
40.0000 ug | INTRAMUSCULAR | Status: DC
  Administered 2012-06-17: 40 ug via INTRAVENOUS
  Filled 2012-06-16: qty 0.4

## 2012-06-16 MED ORDER — ISOSORBIDE MONONITRATE ER 30 MG PO TB24
30.0000 mg | ORAL_TABLET | Freq: Every day | ORAL | Status: DC
  Administered 2012-06-17 – 2012-06-19 (×3): 30 mg via ORAL
  Filled 2012-06-16 (×3): qty 1

## 2012-06-16 MED ORDER — ASPIRIN EC 81 MG PO TBEC
81.0000 mg | DELAYED_RELEASE_TABLET | Freq: Every day | ORAL | Status: DC
  Administered 2012-06-16 – 2012-06-19 (×4): 81 mg via ORAL
  Filled 2012-06-16 (×4): qty 1

## 2012-06-16 MED ORDER — LEVOTHYROXINE SODIUM 50 MCG PO TABS
50.0000 ug | ORAL_TABLET | Freq: Every day | ORAL | Status: DC
  Administered 2012-06-17 – 2012-06-19 (×3): 50 ug via ORAL
  Filled 2012-06-16 (×4): qty 1

## 2012-06-16 MED ORDER — AMLODIPINE BESYLATE 10 MG PO TABS
10.0000 mg | ORAL_TABLET | Freq: Every day | ORAL | Status: DC
  Administered 2012-06-17 – 2012-06-19 (×3): 10 mg via ORAL
  Filled 2012-06-16 (×3): qty 1

## 2012-06-16 MED ORDER — MORPHINE SULFATE 2 MG/ML IJ SOLN
0.3000 mg | INTRAMUSCULAR | Status: DC | PRN
Start: 2012-06-16 — End: 2012-06-19
  Administered 2012-06-16 – 2012-06-17 (×3): 0.3 mg via INTRAVENOUS
  Filled 2012-06-16 (×2): qty 1

## 2012-06-16 MED ORDER — CLONIDINE HCL 0.2 MG PO TABS
0.2000 mg | ORAL_TABLET | Freq: Two times a day (BID) | ORAL | Status: DC
  Administered 2012-06-17 – 2012-06-19 (×6): 0.2 mg via ORAL
  Filled 2012-06-16 (×7): qty 1

## 2012-06-16 MED ORDER — SUFENTANIL CITRATE 50 MCG/ML IV SOLN
INTRAVENOUS | Status: DC | PRN
  Administered 2012-06-16: 10 ug via INTRAVENOUS
  Administered 2012-06-16 (×2): 5 ug via INTRAVENOUS
  Administered 2012-06-16 (×2): 10 ug via INTRAVENOUS
  Administered 2012-06-16 (×2): 5 ug via INTRAVENOUS

## 2012-06-16 MED ORDER — METOCLOPRAMIDE HCL 5 MG/ML IJ SOLN: 5.0000 mg | Freq: Three times a day (TID) | INTRAMUSCULAR | Status: DC | PRN

## 2012-06-16 MED ORDER — OXYCODONE HCL 5 MG PO TABS
5.0000 mg | ORAL_TABLET | ORAL | Status: DC | PRN
  Administered 2012-06-16 – 2012-06-19 (×13): 5 mg via ORAL
  Filled 2012-06-16 (×12): qty 1

## 2012-06-16 MED ORDER — ONDANSETRON HCL 4 MG/2ML IJ SOLN
INTRAMUSCULAR | Status: DC | PRN
  Administered 2012-06-16: 4 mg via INTRAVENOUS

## 2012-06-16 MED ORDER — ALUMINUM HYDROXIDE GEL 320 MG/5ML PO SUSP: 30.0000 mL | Freq: Four times a day (QID) | ORAL | Status: DC | PRN

## 2012-06-16 MED ORDER — INSULIN ASPART PROT & ASPART (70-30 MIX) 100 UNIT/ML ~~LOC~~ SUSP
5.0000 [IU] | Freq: Two times a day (BID) | SUBCUTANEOUS | Status: DC
  Administered 2012-06-17 – 2012-06-18 (×2): 5 [IU] via SUBCUTANEOUS
  Filled 2012-06-16 (×2): qty 10

## 2012-06-16 MED ORDER — 0.9 % SODIUM CHLORIDE (POUR BTL) OPTIME
TOPICAL | Status: DC | PRN
  Administered 2012-06-16: 1000 mL

## 2012-06-16 MED ORDER — ALTEPLASE 2 MG IJ SOLR
2.0000 mg | Freq: Once | INTRAMUSCULAR | Status: AC | PRN
  Filled 2012-06-16: qty 2

## 2012-06-16 MED ORDER — MIDAZOLAM HCL 5 MG/5ML IJ SOLN
INTRAMUSCULAR | Status: DC | PRN
  Administered 2012-06-16: 1 mg via INTRAVENOUS

## 2012-06-16 MED ORDER — LACTATED RINGERS IV SOLN
INTRAVENOUS | Status: DC
Start: 2012-06-16 — End: 2012-06-16

## 2012-06-16 MED ORDER — ERGOCALCIFEROL 1.25 MG (50000 UT) PO CAPS: 50000.0000 [IU] | ORAL_CAPSULE | ORAL | Status: DC

## 2012-06-16 MED ORDER — MORPHINE SULFATE 10 MG/ML IJ SOLN: 0.5000 mg/kg | INTRAMUSCULAR | Status: DC | PRN

## 2012-06-16 MED ORDER — SODIUM CHLORIDE 0.9 % IV SOLN: 100.0000 mL | INTRAVENOUS | Status: DC | PRN

## 2012-06-16 MED ORDER — HYDROMORPHONE HCL PF 1 MG/ML IJ SOLN
0.2500 mg | INTRAMUSCULAR | Status: DC | PRN
  Administered 2012-06-16: 0.5 mg via INTRAVENOUS
  Administered 2012-06-16 (×2): 0.25 mg via INTRAVENOUS

## 2012-06-16 MED ORDER — PROMETHAZINE HCL 25 MG/ML IJ SOLN: 6.2500 mg | INTRAMUSCULAR | Status: DC | PRN

## 2012-06-16 MED ORDER — RENA-VITE PO TABS
1.0000 | ORAL_TABLET | Freq: Every day | ORAL | Status: DC
  Administered 2012-06-17 – 2012-06-18 (×2): 1 via ORAL
  Filled 2012-06-16 (×4): qty 1

## 2012-06-16 MED ORDER — SODIUM CHLORIDE 0.9 % IV SOLN
62.5000 mg | INTRAVENOUS | Status: DC
  Administered 2012-06-17: 62.5 mg via INTRAVENOUS
  Filled 2012-06-16 (×2): qty 5

## 2012-06-16 MED ORDER — INSULIN ASPART 100 UNIT/ML ~~LOC~~ SOLN: 0.0000 [IU] | Freq: Every day | SUBCUTANEOUS | Status: DC

## 2012-06-16 MED ORDER — OXYCODONE HCL 5 MG PO TABS: 5.0000 mg | ORAL_TABLET | ORAL | Status: DC | PRN

## 2012-06-16 MED ORDER — SIMVASTATIN 10 MG PO TABS
10.0000 mg | ORAL_TABLET | Freq: Every day | ORAL | Status: DC
  Administered 2012-06-17 – 2012-06-18 (×3): 10 mg via ORAL
  Filled 2012-06-16 (×4): qty 1

## 2012-06-16 MED ORDER — ROCURONIUM BROMIDE 100 MG/10ML IV SOLN
INTRAVENOUS | Status: DC | PRN
  Administered 2012-06-16: 30 mg via INTRAVENOUS
  Administered 2012-06-16: 10 mg via INTRAVENOUS

## 2012-06-16 SURGICAL SUPPLY — 90 items
3.5x44mm md screw ×2 IMPLANT
BANDAGE ELASTIC 4 VELCRO ST LF (GAUZE/BANDAGES/DRESSINGS) ×2 IMPLANT
BANDAGE ELASTIC 6 VELCRO ST LF (GAUZE/BANDAGES/DRESSINGS) ×2 IMPLANT
BANDAGE ESMARK 6X9 LF (GAUZE/BANDAGES/DRESSINGS) ×1 IMPLANT
BANDAGE GAUZE ELAST BULKY 4 IN (GAUZE/BANDAGES/DRESSINGS) ×2 IMPLANT
BIT DRILL 2.5X2.75 QC CALB (BIT) ×2 IMPLANT
BIT DRILL 3.5X5.5 QC CALB (BIT) ×2 IMPLANT
BIT DRILL CALIBRATED 2.7 (BIT) ×2 IMPLANT
BLADE SURG 10 STRL SS (BLADE) ×2 IMPLANT
BLADE SURG 15 STRL LF DISP TIS (BLADE) ×1 IMPLANT
BLADE SURG 15 STRL SS (BLADE) ×1
BLADE SURG ROTATE 9660 (MISCELLANEOUS) IMPLANT
BNDG COHESIVE 4X5 TAN STRL (GAUZE/BANDAGES/DRESSINGS) ×2 IMPLANT
BNDG ESMARK 6X9 LF (GAUZE/BANDAGES/DRESSINGS) ×2
BONE CHIP PRESERV 20CC (Bone Implant) ×2 IMPLANT
BRUSH SCRUB DISP (MISCELLANEOUS) ×4 IMPLANT
CANISTER SUCTION 2500CC (MISCELLANEOUS) ×2 IMPLANT
CLOTH BEACON ORANGE TIMEOUT ST (SAFETY) ×2 IMPLANT
COVER MAYO STAND STRL (DRAPES) IMPLANT
DRAPE C-ARM 42X72 X-RAY (DRAPES) ×2 IMPLANT
DRAPE C-ARMOR (DRAPES) ×2 IMPLANT
DRAPE INCISE IOBAN 66X45 STRL (DRAPES) ×2 IMPLANT
DRAPE ORTHO SPLIT 77X108 STRL (DRAPES)
DRAPE SURG ORHT 6 SPLT 77X108 (DRAPES) IMPLANT
DRAPE U-SHAPE 47X51 STRL (DRAPES) ×2 IMPLANT
DRSG ADAPTIC 3X8 NADH LF (GAUZE/BANDAGES/DRESSINGS) ×2 IMPLANT
DRSG PAD ABDOMINAL 8X10 ST (GAUZE/BANDAGES/DRESSINGS) ×2 IMPLANT
ELECT CAUTERY BLADE 6.4 (BLADE) ×2 IMPLANT
ELECT REM PT RETURN 9FT ADLT (ELECTROSURGICAL) ×2
ELECTRODE REM PT RTRN 9FT ADLT (ELECTROSURGICAL) ×1 IMPLANT
EVACUATOR 1/8 PVC DRAIN (DRAIN) IMPLANT
EVACUATOR 3/16  PVC DRAIN (DRAIN)
EVACUATOR 3/16 PVC DRAIN (DRAIN) IMPLANT
GLOVE BIO SURGEON STRL SZ7.5 (GLOVE) ×2 IMPLANT
GLOVE BIO SURGEON STRL SZ8 (GLOVE) ×6 IMPLANT
GLOVE BIOGEL PI IND STRL 7.5 (GLOVE) ×1 IMPLANT
GLOVE BIOGEL PI IND STRL 8 (GLOVE) ×1 IMPLANT
GLOVE BIOGEL PI INDICATOR 7.5 (GLOVE) ×1
GLOVE BIOGEL PI INDICATOR 8 (GLOVE) ×1
GOWN PREVENTION PLUS XLARGE (GOWN DISPOSABLE) ×2 IMPLANT
GOWN STRL NON-REIN LRG LVL3 (GOWN DISPOSABLE) ×4 IMPLANT
IMMOBILIZER KNEE 22 UNIV (SOFTGOODS) ×2 IMPLANT
K-WIRE ACE 1.6X6 (WIRE) ×2
KIT BASIN OR (CUSTOM PROCEDURE TRAY) ×2 IMPLANT
KIT INFUSE LRG II (Orthopedic Implant) ×2 IMPLANT
KIT ROOM TURNOVER OR (KITS) ×2 IMPLANT
KWIRE ACE 1.6X6 (WIRE) ×1 IMPLANT
MANIFOLD NEPTUNE II (INSTRUMENTS) IMPLANT
NDL SUT .5 MAYO 1.404X.05X (NEEDLE) IMPLANT
NEEDLE 22X1 1/2 (OR ONLY) (NEEDLE) IMPLANT
NEEDLE MAYO TAPER (NEEDLE)
NS IRRIG 1000ML POUR BTL (IV SOLUTION) ×2 IMPLANT
PACK ORTHO EXTREMITY (CUSTOM PROCEDURE TRAY) ×2 IMPLANT
PAD ARMBOARD 7.5X6 YLW CONV (MISCELLANEOUS) ×4 IMPLANT
PAD CAST 4YDX4 CTTN HI CHSV (CAST SUPPLIES) ×1 IMPLANT
PADDING CAST COTTON 4X4 STRL (CAST SUPPLIES) ×1
PADDING CAST COTTON 6X4 STRL (CAST SUPPLIES) ×2 IMPLANT
PLATE LOCK 6H RT MED DIST TIB (Plate) ×2 IMPLANT
SCREW CORT FT 32X3.5XNONLOCK (Screw) ×1 IMPLANT
SCREW CORTICAL 3.5MM  30MM (Screw) ×1 IMPLANT
SCREW CORTICAL 3.5MM  32MM (Screw) ×1 IMPLANT
SCREW CORTICAL 3.5MM 26MM (Screw) ×4 IMPLANT
SCREW CORTICAL 3.5MM 30MM (Screw) ×1 IMPLANT
SCREW CORTICAL 3.5MM 36MM (Screw) ×2 IMPLANT
SCREW CORTICAL 3.5MM 40MM (Screw) ×2 IMPLANT
SCREW LOCK 3.5X40 DIST TIB (Screw) ×2 IMPLANT
SCREW LOCK CORT STAR 3.5X42 (Screw) ×2 IMPLANT
SCREW LOCK CORT STAR 3.5X44 (Screw) ×2 IMPLANT
SPLINT PLASTER CAST XFAST 5X30 (CAST SUPPLIES) ×1 IMPLANT
SPLINT PLASTER XFAST SET 5X30 (CAST SUPPLIES) ×1
SPONGE GAUZE 4X4 12PLY (GAUZE/BANDAGES/DRESSINGS) ×2 IMPLANT
SPONGE LAP 18X18 X RAY DECT (DISPOSABLE) ×2 IMPLANT
STAPLER VISISTAT 35W (STAPLE) ×2 IMPLANT
STOCKINETTE IMPERVIOUS LG (DRAPES) ×2 IMPLANT
SUCTION FRAZIER TIP 10 FR DISP (SUCTIONS) ×2 IMPLANT
SUT ETHILON 3 0 PS 1 (SUTURE) IMPLANT
SUT PROLENE 0 CT 2 (SUTURE) ×4 IMPLANT
SUT VIC AB 0 CT1 27 (SUTURE) ×1
SUT VIC AB 0 CT1 27XBRD ANBCTR (SUTURE) ×1 IMPLANT
SUT VIC AB 1 CT1 27 (SUTURE) ×1
SUT VIC AB 1 CT1 27XBRD ANBCTR (SUTURE) ×1 IMPLANT
SUT VIC AB 2-0 CT1 27 (SUTURE) ×2
SUT VIC AB 2-0 CT1 TAPERPNT 27 (SUTURE) ×2 IMPLANT
SYR 20ML ECCENTRIC (SYRINGE) IMPLANT
TOWEL OR 17X24 6PK STRL BLUE (TOWEL DISPOSABLE) ×2 IMPLANT
TOWEL OR 17X26 10 PK STRL BLUE (TOWEL DISPOSABLE) ×4 IMPLANT
TRAY FOLEY CATH 14FR (SET/KITS/TRAYS/PACK) IMPLANT
TUBE CONNECTING 12X1/4 (SUCTIONS) ×2 IMPLANT
WATER STERILE IRR 1000ML POUR (IV SOLUTION) IMPLANT
YANKAUER SUCT BULB TIP NO VENT (SUCTIONS) ×2 IMPLANT

## 2012-06-16 NOTE — Transfer of Care (Signed)
Immediate Anesthesia Transfer of Care Note  Patient: [name]  Procedure(s) Performed: Procedure(s): OPEN REDUCTION INTERNAL FIXATION (ORIF) TIBIA FRACTURE (Right)  Patient Location: PACU  Anesthesia Type:General  Level of Consciousness: awake and alert   Airway & Oxygen Therapy: Patient Spontanous Breathing and Patient connected to face mask oxygen  Post-op Assessment: Report given to PACU RN and Post -op Vital signs reviewed and stable  Post vital signs: Reviewed and stable  Complications: No apparent anesthesia complications

## 2012-06-16 NOTE — Anesthesia Postprocedure Evaluation (Signed)
Anesthesia Post Note  Patient: [name]  Procedure(s) Performed: Procedure(s) (LRB): OPEN REDUCTION INTERNAL FIXATION (ORIF) TIBIA FRACTURE (Right)  Anesthesia type: general  Patient location: PACU  Post pain: Pain level controlled  Post assessment: Patient's Cardiovascular Status Stable  Last Vitals:  Filed Vitals:   06/16/12 1430  BP: 168/50  Pulse: 61  Temp: 36.5 C  Resp: 12    Post vital signs: Reviewed and stable  Level of consciousness: sedated  Complications: No apparent anesthesia complications

## 2012-06-16 NOTE — Progress Notes (Signed)
UR COMPLETED  

## 2012-06-16 NOTE — Consult Note (Signed)
[facility] Renal Consultation Note    Indication for Consultation:  Management of ESRD/hemodialysis; anemia, hypertension/volume and secondary hyperparathyroidism  HPI: [name] is a 79 y.o. female with ESRD from the [place] area who dialyzes intermittently as a transient at [facility] on MWF schedule and how is followed by Dr. Carola Frost in [place] for orthopedic issues. She was admitted today following ORIF on right tiba fracture (repair of nonunion allografting originally done 06/2101). She had her usual dialysis yesterday.  Followinig extubation, she coughed up a gelatnous tsp blood in PACU - no more vomiting. No chest pain. Right foot pain is greatest problem. She is due for dialysis Wednesday.  Past Medical History  Diagnosis Date  . CHF (congestive heart failure)   . DM2 (diabetes mellitus, type 2)   . ESRD (end stage renal disease)     HD TTS - [facility]  . HTN (hypertension)   . Hyperlipidemia   . Tibia/fibula fracture 07/10/2011    right   . Hypothyroid   . Cancer     Breast  . Arthritis   . History of blood transfusion    Past Surgical History  Procedure Laterality Date  . Mastectomy    . Abdominal surgery    . Thyroid surgery    . Breast surgery Left     Mastectomy  . Eye surgery Bilateral     Cataract  . Ankle fusion Right   . Arteriovenous graft placement Right    Family History  Problem Relation Age of Onset  . Hyperlipidemia Father   . Diabetes Father   . Breast cancer Sister    Social History:  reports that she has never smoked. Her smokeless tobacco use includes Snuff. She reports that she does not drink alcohol or use illicit drugs. Allergies  Allergen Reactions  . Codeine Swelling   Prior to Admission medications   Medication Sig Start Date End Date Taking? Authorizing Provider  acetaminophen (TYLENOL) 650 MG CR tablet Take 650 mg by mouth every 8 (eight) hours as needed. arthritis   Yes Historical Provider, MD  amLODipine (NORVASC)  10 MG tablet Take 10 mg by mouth daily.   Yes Historical Provider, MD  aspirin EC 81 MG tablet Take 81 mg by mouth daily.   Yes Historical Provider, MD  B Complex-C-Folic Acid (VOL-CARE RX) 1 MG TABS Take by mouth at bedtime.   Yes Historical Provider, MD  calcium acetate (PHOSLO) 667 MG capsule Take 667 mg by mouth 3 (three) times daily with meals.   Yes Historical Provider, MD  cloNIDine (CATAPRES) 0.2 MG tablet Take 0.2 mg by mouth 2 (two) times daily.   Yes Historical Provider, MD  ergocalciferol (VITAMIN D2) 50000 UNITS capsule Take 50,000 Units by mouth every Thursday. Usually on Thursday   Yes Historical Provider, MD  insulin lispro protamine-insulin lispro (HUMALOG MIX 75/25 KWIKPEN) (75-25) 100 UNIT/ML SUSP Inject 5-10 Units into the skin 2 (two) times daily. 5 units AM and 10 units PM 08/02/11  Yes Newt Lukes, MD  isosorbide mononitrate (IMDUR) 30 MG 24 hr tablet Take 30 mg by mouth daily.   Yes Historical Provider, MD  levothyroxine (SYNTHROID, LEVOTHROID) 50 MCG tablet Take 50 mcg by mouth daily.   Yes Historical Provider, MD  pantoprazole (PROTONIX) 40 MG tablet Take 1 tablet (40 mg total) by mouth daily at 12 noon. 07/14/11 07/13/12 Yes Belkys A Regalado, MD  simvastatin (ZOCOR) 10 MG tablet Take 10 mg by mouth at bedtime.   Yes  Historical Provider, MD  vitamin E 400 UNIT capsule Take 400 Units by mouth daily.   Yes Historical Provider, MD   Current Facility-Administered Medications  Medication Dose Route Frequency Provider Last Rate Last Dose  . HYDROmorphone (DILAUDID) 1 MG/ML injection           . HYDROmorphone (DILAUDID) injection 0.25-0.5 mg  0.25-0.5 mg Intravenous Q5 min PRN [name] Richter, MD   0.25 mg at 06/16/12 1414  . lactated ringers infusion   Intravenous Continuous Mearl Latin, PA-C      . metoCLOPramide [facility]) tablet 5-10 mg  5-10 mg Oral Q8H PRN Mearl Latin, PA-C       Or  . metoCLOPramide (REGLAN) injection 5-10 mg  5-10 mg Intravenous Q8H PRN Mearl Latin, PA-C      . mupirocin ointment Idelle Jo) 2 %   Nasal Once Budd Palmer, MD      . mupirocin ointment (BACTROBAN) 2 %           . oxyCODONE (Oxy IR/ROXICODONE) immediate release tablet 5 mg  5 mg Oral Q4H PRN Mearl Latin, PA-C      . promethazine (PHENERGAN) injection 6.25-12.5 mg  6.25-12.5 mg Intravenous Q15 min PRN [name] Richter, MD   6.25 mg at 06/16/12 1300   Labs: Basic Metabolic Panel:  Recent Labs Lab 06/16/12 0638  NA 145  K 3.9  CL 102  CO2 29  GLUCOSE 148*  BUN 34*  CREATININE 6.37*  CALCIUM 8.3*   Liver Function Tests:  Recent Labs Lab 06/16/12 0638  AST 15  ALT 9  ALKPHOS 91  BILITOT 0.2*  PROT 7.0  ALBUMIN 3.3*  CBC:  Recent Labs Lab 06/16/12 0638  WBC 6.4  NEUTROABS 4.1  HGB 10.7*  HCT 33.3*  MCV 97.4  PLT 317   CCBG:  Recent Labs Lab 06/16/12 0636 06/16/12 1144  GLUCAP 143* 101*  Studies/Results: Dg Ankle Complete Right  06/16/2012  *RADIOLOGY REPORT*  Clinical Data: Internal fixation for a tibial nonunion.  RIGHT ANKLE - COMPLETE 3+ VIEW  Comparison: CT 04/14/2012  Findings: Three intraoperative images demonstrate a surgical plate along the medial aspect of the tibia with screw fixation.  There is irregularity of the distal tibia and consistent with history of nonunion.  Again noted is plate and screw fixation of the distal fibula.  IMPRESSION: Internal fixation of the distal tibia.   Original Report Authenticated By: Richarda Overlie, M.D.    Dg Ankle Right Port  06/16/2012  *RADIOLOGY REPORT*  Clinical Data: Postop  PORTABLE RIGHT ANKLE - 2 VIEW  Comparison: 04/14/2012 CT.  Findings: Overlying cast obscures fine osseous and soft tissue detail.  Remote internal fixation distal fibula.  Side plate and screws placed across distal right tibial nonunion.  IMPRESSION: Internal fixation distal right tibia nonunion.   Original Report Authenticated By: Lacy Duverney, M.D.     ROS: Limited due to severe pain in PACU  Physical Exam:  Filed  Vitals:   06/16/12 1345 06/16/12 1400 06/16/12 1415 06/16/12 1430  BP: 167/58 181/48 167/44 168/50  Pulse: 67   61  Temp:    97.7 F (36.5 C)  TempSrc:      Resp: 19 15 14 12   Height:      Weight:      SpO2: 100%   99%     General: Well developed, well nourished miserable,  moaning in pain in PACU Head: Normocephalic, atraumatic, sclera non-icteric; dried blood on lips.  Neck: Supple.  Lungs: Grossly clear anteriorly without wheezes, rales, or rhonchi. Breathing is unlabored. Heart: RRR with occasional ectopy; left mastectomy scar Abdomen: obese,soft, non-tender, non-distended with midline scar Lower extremities: left without edema or ischemic changes, no open wounds; right LE in cast Neuro: Alert  Dialysis Access: right upper AVF + bruit and thrill  Dialysis Orders: Center: [place] - transient who visits frequently from [place] MWF 3.25 hours 2 K 2.25 EDW 68 heparin 2000 Epo 4400 TIW venofer 50/week hectorol 3 TIW  Assessment/Plan: 1.  s/p ORIF right tibia - per Dr. Carola Frost 2.  ESRD -  MWF - last HD 3/31-  Outpt Kt/Vs on the low side (1.3).  Not aware of the history behind her low BFR and low times of 3.25 hr. HD orders written for Wednesday without heparin 3.  Hypertension/volume  - EDW raised 1 kg between 3/21 and 3/31 apparently when she had recently returned to [place] for 10 days.  BP tend to be a little high. Evaluate EDW while here. Fluid gains modest; continue current BP meds.. 4.  Anemia  - Hgb 10.7 pre op - recheck pre HD; continue ESA and weekly IV Fe - follow CBC 5.  Metabolic bone disease -  Continue hectorol and binders -  6.  Nutrition - needs high protein renal diet 7. Type 2 DM 8. Hypothyroidism  Sheffield Slider, PA-C [facility] Beeper [phone] 06/16/2012, 2:38 PM   I have seen and examined this patient and agree with plan per [name].  79yo BF SP ORIF Rt Tibia.  Now with dry heaves post anesthesia.  Lungs clear.  + bruit in RUA AVF.  Will  plan HD in AM. Meds reviewed and renal diet ordered  Aubrionna Istre T,MD 06/16/2012 3:32 PM

## 2012-06-16 NOTE — Preoperative (Signed)
Beta Blockers   Reason not to administer Beta Blockers:Not Applicable 

## 2012-06-16 NOTE — H&P (Signed)
I have seen and examined the patient. I agree with the findings above. I discussed with the patient the risks and benefits of surgery for repair of her right tibia nonunion, including the possibility of infection, nerve injury, vessel injury, wound breakdown, arthritis, symptomatic hardware, persistent nonunion, DVT/ PE, loss of motion, and need for further surgery among others.  She understood these risks and wished to proceed.   Budd Palmer, MD 06/16/2012 8:14 AM

## 2012-06-16 NOTE — Anesthesia Procedure Notes (Signed)
Procedure Name: Intubation Date/Time: 06/16/2012 8:26 AM Performed by: Charm Barges, Giuliana Handyside R Pre-anesthesia Checklist: Patient identified, Emergency Drugs available, Suction available, Patient being monitored and Timeout performed Patient Re-evaluated:Patient Re-evaluated prior to inductionOxygen Delivery Method: Circle system utilized Preoxygenation: Pre-oxygenation with 100% oxygen Intubation Type: IV induction Ventilation: Mask ventilation without difficulty Laryngoscope Size: Mac and 3 Grade View: Grade II Tube type: Oral Number of attempts: 1 Airway Equipment and Method: Stylet Placement Confirmation: ETT inserted through vocal cords under direct vision,  positive ETCO2 and breath sounds checked- equal and bilateral Secured at: 21 cm Tube secured with: Tape Dental Injury: Teeth and Oropharynx as per pre-operative assessment

## 2012-06-16 NOTE — Brief Op Note (Signed)
06/16/2012  11:57 AM  PATIENT:  [name]  79 y.o. female  PRE-OPERATIVE DIAGNOSIS:  RIGHT TIBIA NON UNION  POST-OPERATIVE DIAGNOSIS:  RIGHT TIBIA NON UNION  PROCEDURE:  Procedure(s): OPEN REDUCTION INTERNAL FIXATION (ORIF) TIBIA FRACTURE (Right) repair of nonunion Infuse allografting  SURGEON:  Surgeon(s) and Role:    * Budd Palmer, MD - Primary  ANESTHESIA:   general  EBL:  Total I/O In: 600 [I.V.:600] Out: -   BLOOD ADMINISTERED:none  DRAINS: none   LOCAL MEDICATIONS USED:  NONE  SPECIMEN:  Source of Specimen:  nonunion site  DISPOSITION OF SPECIMEN:  micro  COUNTS:  YES  TOURNIQUET:    DICTATION: .Other Dictation: Dictation Number [id number]  PLAN OF CARE: Admit to inpatient   PATIENT DISPOSITION:  PACU - hemodynamically stable.   Delay start of Pharmacological VTE agent (>24hrs) due to surgical blood loss or risk of bleeding: no

## 2012-06-16 NOTE — Anesthesia Preprocedure Evaluation (Addendum)
Anesthesia Evaluation  Patient identified by MRN, date of birth, ID band Patient awake    Reviewed: Allergy & Precautions, H&P , NPO status , Patient's Chart, lab work & pertinent test results  History of Anesthesia Complications Negative for: history of anesthetic complications  Airway Mallampati: I TM Distance: >3 FB Neck ROM: Full    Dental  (+) Edentulous Upper, Partial Lower and Poor Dentition   Pulmonary neg pulmonary ROS,    Pulmonary exam normal       Cardiovascular hypertension, Pt. on medications +CHF     Neuro/Psych negative neurological ROS  negative psych ROS   GI/Hepatic negative GI ROS, Neg liver ROS,   Endo/Other  diabetes, Well Controlled, Insulin DependentHypothyroidism   Renal/GU Dialysis and ESRFRenal disease     Musculoskeletal   Abdominal   Peds  Hematology negative hematology ROS (+)   Anesthesia Other Findings   Reproductive/Obstetrics                         Anesthesia Physical Anesthesia Plan  ASA: III  Anesthesia Plan: General   Post-op Pain Management:    Induction: Intravenous  Airway Management Planned: Oral ETT  Additional Equipment:   Intra-op Plan:   Post-operative Plan: Extubation in OR  Informed Consent: I have reviewed the patients History and Physical, chart, labs and discussed the procedure including the risks, benefits and alternatives for the proposed anesthesia with the patient or authorized representative who has indicated his/her understanding and acceptance.   Dental advisory given  Plan Discussed with: CRNA, Anesthesiologist and Surgeon  Anesthesia Plan Comments:        Anesthesia Quick Evaluation

## 2012-06-17 DIAGNOSIS — IMO0002 Reserved for concepts with insufficient information to code with codable children: Principal | ICD-10-CM | POA: Diagnosis present

## 2012-06-17 LAB — CBC
HCT: 30.6 % — ABNORMAL LOW (ref 36.0–46.0)
Hemoglobin: 9.9 g/dL — ABNORMAL LOW (ref 12.0–15.0)
MCH: 31.8 pg (ref 26.0–34.0)
MCHC: 32.4 g/dL (ref 30.0–36.0)
MCV: 98.4 fL (ref 78.0–100.0)
Platelets: 305 K/uL (ref 150–400)
RBC: 3.11 MIL/uL — ABNORMAL LOW (ref 3.87–5.11)
RDW: 15.9 % — ABNORMAL HIGH (ref 11.5–15.5)
WBC: 10.6 K/uL — ABNORMAL HIGH (ref 4.0–10.5)

## 2012-06-17 LAB — COMPREHENSIVE METABOLIC PANEL
AST: 29 U/L (ref 0–37)
Albumin: 3.3 g/dL — ABNORMAL LOW (ref 3.5–5.2)
BUN: 46 mg/dL — ABNORMAL HIGH (ref 6–23)
Chloride: 100 mEq/L (ref 96–112)
Creatinine, Ser: 8.93 mg/dL — ABNORMAL HIGH (ref 0.50–1.10)
Total Protein: 7 g/dL (ref 6.0–8.3)

## 2012-06-17 LAB — HEMOGLOBIN A1C
Hgb A1c MFr Bld: 6.1 % — ABNORMAL HIGH (ref <5.7)
Mean Plasma Glucose: 128 mg/dL — ABNORMAL HIGH (ref <117)

## 2012-06-17 LAB — GLUCOSE, CAPILLARY
Glucose-Capillary: 170 mg/dL — ABNORMAL HIGH (ref 70–99)
Glucose-Capillary: 53 mg/dL — ABNORMAL LOW (ref 70–99)
Glucose-Capillary: 144 mg/dL — ABNORMAL HIGH (ref 70–99)
Glucose-Capillary: 202 mg/dL — ABNORMAL HIGH (ref 70–99)

## 2012-06-17 LAB — PHOSPHORUS: Phosphorus: 5.9 mg/dL — ABNORMAL HIGH (ref 2.3–4.6)

## 2012-06-17 NOTE — Progress Notes (Signed)
Rehab Admissions Coordinator Note:  Patient was screened by Trish Mage for appropriateness for an Inpatient Acute Rehab Consult.  PT/OT consults and recommendations are pending.  I will wait for therapy notes and then will complete the prescreen.    Trish Mage 06/17/2012, 3:10 PM  I can be reached at [phone].

## 2012-06-17 NOTE — Progress Notes (Signed)
Hypoglycemic Event  CBG: 53  Treatment: 15 GM carbohydrate snack  Symptoms: None  Follow-up CBG: Time:1209 CBG Result:81  Possible Reasons for Event: Inadequate meal intake  Comments/MD notifiedno    Briant Sites  Remember to initiate Hypoglycemia Order Set & complete

## 2012-06-17 NOTE — Progress Notes (Signed)
S: pain controlled.  Nausea resolved.  Ate breakfast O:BP 160/54  Pulse 66  Temp(Src) 98 F (36.7 C) (Oral)  Resp 15  Ht 5\' 2"  (1.575 m)  Wt 66.906 kg (147 lb 8 oz)  BMI 26.97 kg/m2  SpO2 97%  Intake/Output Summary (Last 24 hours) at 06/17/12 1022 Last data filed at 06/17/12 0830  Gross per 24 hour  Intake   1200 ml  Output      0 ml  Net   1200 ml   Weight change: 1.134 kg (2 lb 8 oz) ZOX:WRUEA and alert CVS:RRR Resp:clear anteriorly Abd:+ BS NTND Ext: no edema on LT  Rt leg bandaged NEURO:Ox3 CNI    . acetaminophen  1,000 mg Intravenous Q6H  . amLODipine  10 mg Oral Daily  . aspirin EC  81 mg Oral Daily  . calcium acetate  667 mg Oral TID WC  . cloNIDine  0.2 mg Oral BID  . darbepoetin (ARANESP) injection - DIALYSIS  40 mcg Intravenous Q Wed-HD  . docusate sodium  100 mg Oral BID  . doxercalciferol  3 mcg Intravenous Q M,W,F-HD  . enoxaparin (LOVENOX) injection  30 mg Subcutaneous Q24H  . ferric gluconate (FERRLECIT/NULECIT) IV  62.5 mg Intravenous Q Wed-HD  . insulin aspart  0-5 Units Subcutaneous QHS  . insulin aspart  0-9 Units Subcutaneous TID WC  . insulin aspart protamine-insulin aspart  5 Units Subcutaneous BID WC  . isosorbide mononitrate  30 mg Oral Daily  . levothyroxine  50 mcg Oral Q breakfast  . multivitamin  1 tablet Oral QHS  . pantoprazole  40 mg Oral Q1200  . simvastatin  10 mg Oral QHS  . [START ON 06/18/2012] Vitamin D (Ergocalciferol)  50,000 Units Oral Q Thu   Dg Ankle Complete Right  06/16/2012  *RADIOLOGY REPORT*  Clinical Data: Internal fixation for a tibial nonunion.  RIGHT ANKLE - COMPLETE 3+ VIEW  Comparison: CT 04/14/2012  Findings: Three intraoperative images demonstrate a surgical plate along the medial aspect of the tibia with screw fixation.  There is irregularity of the distal tibia and consistent with history of nonunion.  Again noted is plate and screw fixation of the distal fibula.  IMPRESSION: Internal fixation of the distal tibia.    Original Report Authenticated By: Richarda Overlie, M.D.    Dg Ankle Right Port  06/16/2012  *RADIOLOGY REPORT*  Clinical Data: Postop  PORTABLE RIGHT ANKLE - 2 VIEW  Comparison: 04/14/2012 CT.  Findings: Overlying cast obscures fine osseous and soft tissue detail.  Remote internal fixation distal fibula.  Side plate and screws placed across distal right tibial nonunion.  IMPRESSION: Internal fixation distal right tibia nonunion.   Original Report Authenticated By: Lacy Duverney, M.D.    BMET    Component Value Date/Time   NA 145 06/16/2012 0638   K 3.9 06/16/2012 0638   CL 102 06/16/2012 0638   CO2 29 06/16/2012 0638   GLUCOSE 148* 06/16/2012 0638   BUN 34* 06/16/2012 0638   CREATININE 6.37* 06/16/2012 0638   CALCIUM 8.3* 06/16/2012 0638   CALCIUM 7.6* 08/28/2009 1044   GFRNONAA 5* 06/16/2012 0638   GFRAA 6* 06/16/2012 0638   CBC    Component Value Date/Time   WBC 6.4 06/16/2012 0638   RBC 3.42* 06/16/2012 0638   HGB 10.7* 06/16/2012 0638   HCT 33.3* 06/16/2012 0638   PLT 317 06/16/2012 0638   MCV 97.4 06/16/2012 0638   MCH 31.3 06/16/2012 0638   MCHC 32.1 06/16/2012 5409  RDW 16.0* 06/16/2012 0638   LYMPHSABS 1.1 06/16/2012 0638   MONOABS 0.9 06/16/2012 0638   EOSABS 0.4 06/16/2012 0638   BASOSABS 0.0 06/16/2012 4540     Assessment: 1. SP ORIF Rt Tibia 2. HTN 3. Anemia on aranesp 4. Sec HPTH on hectorol 5.ESRD  Plan: 1. HD today 2. Labs to be done with HD   [name] T

## 2012-06-17 NOTE — Progress Notes (Signed)
OT Cancellation Note  Patient Details Name: [name] MRN: [id number] DOB: 1928/07/16   Cancelled Treatment:    Reason Eval/Treat Not Completed: Patient at procedure or test/ unavailable (HD).  Will see tomorrow  Boykin Reaper [phone] 06/17/2012, 2:56 PM

## 2012-06-17 NOTE — Op Note (Signed)
NAMENEDDA, GAINS               ACCOUNT NO.:  [id number]  MEDICAL RECORD NO.:  [id number]  LOCATION:  [id number]                        FACILITY:  [facility]  PHYSICIAN:  Doralee Albino. Carola Frost, M.D. DATE OF BIRTH:  1928/09/05  DATE OF PROCEDURE:  06/16/2012 DATE OF DISCHARGE:                              OPERATIVE REPORT   PREOPERATIVE DIAGNOSIS:  Right tibia nonunion.  POSTOPERATIVE DIAGNOSIS:  Right tibia nonunion.  PROCEDURE:  Repair of right tibia nonunion with open reduction and internal fixation and Infuse allografting.  SURGEON:  Doralee Albino. Carola Frost, M.D.  ASSISTANT:  None.  ANESTHESIA:  General, Adonis Huguenin.  SPECIMENS:  To Micro.  FINDINGS:  No bacteria.  BLOOD LOSS:  Less than 50 mL.  TOURNIQUET:  None.  DISPOSITION:  To PACU.  CONDITION:  Stable.  BRIEF SUMMARY AND INDICATION OF PROCEDURE:  [name] is a very pleasant 79 year old female dialysis patient who sustained a right tib- fib fracture that was bilateral involving the distal tibia and proximal fibula that went on to nonunion.  She has been having pain with attempted weightbearing and now for approximately year, and plain film CT demonstrated persistent nonunion.  I discussed with her the risks and benefits of surgical repair including the possibility of infection, nerve injury, vessel injury, DVT, PE, failure of repair, need for further surgery, and loss of motion, symptomatic hardware, and numerous others.  The patient understood these risk and did wish to proceed.  BRIEF SUMMARY OF PROCEDURE:  [name] was given antibiotics preoperatively, taken to the operating room where general anesthesia was induced.  Her right lower extremity was prepped and draped in usual sterile fashion.  No tourniquet was used during the procedure.  The one was on her right side.  A medial approach was made to facilitate plate placement in the expectation of applying distraction and correcting the varus alignment.  The  nonunion site was identified subperiosteally with a 15-blade and tracked in numerous directions, but was found to actually have a very small spicule of healing and bridging bone comprising about 10% or more of the nonunion site.  The remainder of the nonunion site, which extended from anteromedially all the way posteriorly was developed and cleaned out using a series of 15-blade, small curettes and rongeur. Once all of the fibrous tissue was debrided from this area, we were able to send some of this tissue down for culture and analysis.  There were no bacteria seen on this.  This was followed by aggressive irrigation and then placement of large Infuse allograft sponge coupled with 20 mL of cancellous chips that were placed in [place] fashion into the defect.  This completely filled the defect area.  I did have to contour the medial plate from [employer] to protect the bridge of bone as I did not wish to shave this down and completely jeopardized the stability. However, it was of course mobile enough that I was able to counteract some of the varus deformity force and used the plate and hardware construct coupled with clamp placed at the maximal point of deformity to improve the alignment.  The series of the standard screws followed by locked screws were then placed distally and  all standard proximally. One lag screw was placed as well.  The wound was then closed in standard layered fashion using 2-0 Vicryl and 3-0 nylon.  Sterile gently compressive dressing and posterior stirrup splint was then applied.  The patient was awakened from anesthesia and transported to the PACU in stable condition.  PROGNOSIS:  [name] will be kept for the next day or 2 for pain control and dialysis.  We anticipate discharge thereafter in the splint. She will remain nonweightbearing until she returns to the office for removal of her sutures in 2 weeks and probable conversion to a Cam boot. At that point, we  will consider some very limited partial weightbearing with full weightbearing beginning at 6 weeks.  Given her medical comorbidities and doubt, she is certainly at increased risk for persistent nonunion and thin body habitus for symptomatic hardware.     Doralee Albino. Carola Frost, M.D.     [facility]MED[facility]  D:  06/16/2012  T:  06/17/2012  Job:  [id number]

## 2012-06-17 NOTE — Progress Notes (Signed)
Orthopaedic Trauma Service Progress Note    1 Day Post-Op  Subjective  Doing well this am C/o pain in R leg, but expected Tolerated breakfast this am  Dialysis to day  Greatly appreciate Renal assistance   Objective  BP 160/54  Pulse 66  Temp(Src) 98 F (36.7 C) (Oral)  Resp 15  Ht 5\' 2"  (1.575 m)  Wt 66.906 kg (147 lb 8 oz)  BMI 26.97 kg/m2  SpO2 97%  Patient Vitals for the past 24 hrs:  BP Temp Temp src Pulse Resp SpO2  06/17/12 0526 160/54 mmHg 98 F (36.7 C) Oral 66 15 97 %  06/17/12 0016 155/54 mmHg 98.4 F (36.9 C) Oral 79 16 100 %  06/16/12 2054 156/43 mmHg 98.5 F (36.9 C) Oral 67 16 99 %  06/16/12 1500 160/49 mmHg 98.1 F (36.7 C) - 80 16 -  06/16/12 1430 168/50 mmHg 97.7 F (36.5 C) - 61 12 99 %  06/16/12 1415 167/44 mmHg - - - 14 -  06/16/12 1400 181/48 mmHg - - - 15 -  06/16/12 1345 167/58 mmHg - - 67 19 100 %  06/16/12 1330 147/71 mmHg - - 73 15 99 %  06/16/12 1315 174/100 mmHg - - 93 19 96 %  06/16/12 1300 234/83 mmHg - - 83 14 97 %  06/16/12 1245 190/56 mmHg - - 70 10 100 %  06/16/12 1230 178/59 mmHg - - 73 13 100 %  06/16/12 1215 201/70 mmHg - - 87 11 100 %  06/16/12 1200 153/51 mmHg - - 65 7 100 %  06/16/12 1145 136/46 mmHg - - 73 21 94 %  06/16/12 1140 - 97 F (36.1 C) - 75 9 65 %    Intake/Output     04/01 0701 - 04/02 0700 04/02 0701 - 04/03 0700   P.O. 360    I.V. (mL/kg) 600 (9)    Total Intake(mL/kg) 960 (14.3)    Net +960            Labs  CBC, CMET and Phos pending  CBG (last 3)   Recent Labs  06/16/12 1609 06/16/12 2156 06/17/12 0648  GLUCAP 130* 86 170*      Exam  Gen:  Awake and alert, appears comfortable, NAD Lungs: clear B  Cardiac: s1 and s2 Abd: + BS, soft, NT Ext:      Right Lower Extremity    Splint C/D/I  Distal motor and sensory functions  Ext cool  + DP pulse  Swelling controlled        Assessment and Plan  1 Day Post-Op  79 y/o s/p repair of R tibial shaft nonunion POD 1  1. R  tibial shaft nonunion POD 1  NWB x 6 weeks  ROM as tolerated toes and knee  PT/OT consults  Ice and elevate  2. ESRD  Per renal  Dialysis today 3. HTN  Continue home meds  4. DM  SSI  novolog 70/30  5. DVT/PE prophylaxis  Lovenox while inpatient  Will likely d/c with 7 days of lovenox 6. FEN  Renal diet  KVO IVF 7. Metabolic bone disease  Continue with supplementation   Phos level pending 8. Dispo  PT/OT today   Dialysis today  Plan for d/c tomorrow   Mearl Latin, PA-C [facility] [phone] (P) 06/17/2012 8:33 AM

## 2012-06-17 NOTE — Progress Notes (Signed)
PHYSICAL THERAPY EVALUATION NOTE 06/17/2012  06/17/12 1600  PT Visit Information  Last PT Received On 06/17/12  Assistance Needed +1  PT Time Calculation  PT Start Time 1050  PT Stop Time 1109  PT Time Calculation (min) 19 min  Subjective Data  Subjective It hurts too bad.  Precautions  Precautions Fall  Required Braces or Orthoses Other Brace/Splint  Other Brace/Splint Cast splint on Right lower leg and foot.   Restrictions  Weight Bearing Restrictions Yes  RLE Weight Bearing NWB  Home Living  Lives With Daughter  Available Help at Discharge Family;Available PRN/intermittently;Personal care attendant (care attendant 8 hours per day)  Type of Home House  Home Access Stairs to enter  Entrance Stairs-Number of Steps 6  Entrance Stairs-Rails Left  Home Layout One level  Bathroom Shower/Tub Tub/shower unit;Curtain  Theatre manager - rolling;Bedside commode/3-in-1  Prior Function  Level of Independence Needs assistance  Needs Assistance Meal Prep;Light Housekeeping;Dressing  Communication  Communication No difficulties  Cognition  Overall Cognitive Status Appears within functional limits for tasks assessed/performed  Arousal/Alertness Awake/alert  Orientation Level Appears intact for tasks assessed  Behavior During Session [facility] for tasks performed  Right Upper Extremity Assessment  RUE ROM/Strength/Tone WFL  Left Upper Extremity Assessment  LUE ROM/Strength/Tone WFL  Right Lower Extremity Assessment  RLE ROM/Strength/Tone Unable to fully assess;Deficits  RLE ROM/Strength/Tone Deficits Hip and knee ROM within functional limits.  Unable to assess foot and ankle.   Left Lower Extremity Assessment  LLE ROM/Strength/Tone WFL for tasks assessed  Trunk Assessment  Trunk Assessment Normal  Bed Mobility  Bed Mobility Supine to Sit;Sit to Supine;Sitting - Scoot to Edge of Bed  Supine to Sit 4: Min assist;HOB elevated  Sitting - Scoot to  [place] of Bed 3: Mod assist;With rail  Sit to Supine 3: Mod assist;HOB flat  Details for Bed Mobility Assistance Pt required assistance for R LE secondary to pain.     Transfers  Transfers Sit to Stand;Stand to Sit  Sit to Stand 1: +2 Total assist;With upper extremity assist;From bed  Sit to Stand: Patient Percentage 60%  Stand to Sit 3: Mod assist  Details for Transfer Assistance Vcs for hand placement and NWB in RLE. Second person assist to manage RLE as pt was unable to tolerate having RLE in dependent position.    Ambulation/Gait  Ambulation/Gait Assistance Not tested (comment)  Stairs No  PT - End of Session  Equipment Utilized During Treatment Gait belt  Activity Tolerance Patient limited by pain  Patient left in bed;with call bell/phone within reach;with family/visitor present  Nurse Communication Mobility status;Weight bearing status  PT Assessment  Clinical Impression Statement 79 y/o s/p repair of R tibial shaft nonunion POD 1. Pt mobility mostly limited by pain as she could not tolerate having RLE in dependent position. Pt will be living with her daughter who works days. Pt has a personal care attendant 8 hours per day. Pt may benefit from CIR consult to promote independence with mobility for safe d/c to home. Acute PT to follow pt.   PT Recommendation/Assessment Patient needs continued PT services  PT Problem List Decreased strength;Decreased activity tolerance;Decreased mobility;Pain;Obesity  Barriers to Discharge None  PT Therapy Diagnosis  Difficulty walking;Generalized weakness;Acute pain  PT Plan  PT Frequency Min 4X/week  PT Treatment/Interventions DME instruction;Stair training;Gait training;Functional mobility training;Therapeutic activities;Patient/family education;Wheelchair mobility training  PT Recommendation  Recommendations for Other Services Rehab consult  Follow Up Recommendations CIR  PT equipment  None recommended by PT  Individuals Consulted  Consulted and  Agree with Results and Recommendations Patient  Acute Rehab PT Goals  PT Goal Formulation With patient  Time For Goal Achievement 07/01/12  Potential to Achieve Goals Fair  Pt will go Supine/Side to Sit with modified independence  PT Goal: Supine/Side to Sit - Progress Goal set today  Pt will go Sit to Supine/Side with modified independence  PT Goal: Sit to Supine/Side - Progress Goal set today  Pt will Transfer Bed to Chair/Chair to Bed with supervision  PT Transfer Goal: Bed to Chair/Chair to Bed - Progress Goal set today  Pt will Ambulate 16 - 50 feet;with supervision;with rolling walker  PT Goal: Ambulate - Progress Goal set today  Pt will Go Up / Down Stairs 3-5 stairs;with mod assist;with least restrictive assistive device  PT Goal: Up/Down Stairs - Progress Goal set today  Kerrie Timm L. Kerryn Tennant DPT [phone]

## 2012-06-17 NOTE — Procedures (Signed)
Pt seen on HD.  Ap 30 vp 100  BFR 150. HD just getting started

## 2012-06-18 ENCOUNTER — Encounter (HOSPITAL_COMMUNITY): Payer: Self-pay | Admitting: Orthopedic Surgery

## 2012-06-18 DIAGNOSIS — S82209A Unspecified fracture of shaft of unspecified tibia, initial encounter for closed fracture: Secondary | ICD-10-CM

## 2012-06-18 LAB — GLUCOSE, CAPILLARY: Glucose-Capillary: 139 mg/dL — ABNORMAL HIGH (ref 70–99)

## 2012-06-18 NOTE — Progress Notes (Signed)
I have seen and examined the patient. I agree with the findings above.  Budd Palmer, MD 06/18/2012 8:22 AM

## 2012-06-18 NOTE — Progress Notes (Signed)
Orthopaedic Trauma Service Progress Note   2 Days Post-Op  Subjective   Doing well No new issues Tolerated HD yesterday Pain controlled Worked well with PT yesterday   Objective  BP 105/55  Pulse 72  Temp(Src) 98.5 F (36.9 C) (Oral)  Resp 19  Ht 5\' 2"  (1.575 m)  Wt 74.3 kg (163 lb 12.8 oz)  BMI 29.95 kg/m2  SpO2 94%  Patient Vitals for the past 24 hrs:  BP Temp Temp src Pulse Resp SpO2 Weight  06/18/12 0602 105/55 mmHg 98.5 F (36.9 C) Oral 72 19 94 % -  06/17/12 2102 194/74 mmHg 100.4 F (38 C) Oral 97 20 94 % -  06/17/12 1843 158/63 mmHg 99.2 F (37.3 C) - 87 20 94 % -  06/17/12 1755 185/67 mmHg 98.9 F (37.2 C) Oral 78 20 92 % 74.3 kg (163 lb 12.8 oz)  06/17/12 1727 195/100 mmHg - - 78 - - -  06/17/12 1700 192/65 mmHg - - 83 - - -  06/17/12 1630 172/55 mmHg - - 78 - - -  06/17/12 1600 178/58 mmHg - - 79 - - -  06/17/12 1530 150/96 mmHg - - 83 - - -  06/17/12 1500 198/68 mmHg - - 66 - - -  06/17/12 1430 199/68 mmHg - - 64 - - -  06/17/12 1413 184/68 mmHg - - 67 - - -  06/17/12 1408 168/70 mmHg - - 73 - - -  06/17/12 1403 159/67 mmHg 98.6 F (37 C) Oral 77 16 95 % 74.5 kg (164 lb 3.9 oz)  06/17/12 1300 153/50 mmHg - - 64 16 96 % -    Intake/Output     04/02 0701 - 04/03 0700 04/03 0701 - 04/04 0700   P.O. 720    I.V. (mL/kg)     Total Intake(mL/kg) 720 (9.7)    Other 612    Total Output 612     Net +108            Labs  CBG (last 3)   Recent Labs  06/17/12 1849 06/17/12 2122 06/18/12 0651  GLUCAP 116* 202* 139*    Exam  Gen:  Awake and alert, NAD, appears very comfortable Lungs: clear Cardiac: reg Abd: + BS, NT Ext:         Right Lower Extremity              Splint C/D/I             Distal motor and sensory functions             Ext cool             + DP pulse             Swelling controlled        Assessment and Plan  2 Days Post-Op  79 y/o s/p repair of R tibial shaft nonunion POD 2  1. R tibial shaft nonunion POD 2            NWB x 6 weeks             ROM as tolerated toes and knee             PT/OT consults             Ice and elevate  CIR consult pending  2. ESRD             Per renal  Dialysis tomorrow 3. HTN             Continue home meds  4. DM             SSI             novolog 70/30  5. DVT/PE prophylaxis             Lovenox while inpatient             Will likely d/c with 7 days of lovenox 6. FEN             Renal diet             KVO IVF 7. Metabolic bone disease             Continue with supplementation               Phos level pending 8. Dispo            PT/OT               Dialysis tomorrow             Plan for d/c to CIR or home, pt ready for CIR when bed available   Ortho issues stable   Mearl Latin, PA-C [facility] [phone] (P) 06/18/2012 8:44 AM

## 2012-06-18 NOTE — Progress Notes (Signed)
S: pain better O:BP 105/55  Pulse 72  Temp(Src) 98.5 F (36.9 C) (Oral)  Resp 19  Ht 5\' 2"  (1.575 m)  Wt 74.3 kg (163 lb 12.8 oz)  BMI 29.95 kg/m2  SpO2 94%  Intake/Output Summary (Last 24 hours) at 06/18/12 0818 Last data filed at 06/17/12 1727  Gross per 24 hour  Intake    480 ml  Output    612 ml  Net   -132 ml   Weight change: 7.594 kg (16 lb 11.9 oz) ZOX:WRUEA and alert CVS:RRR Resp:clear anteriorly Abd:+ BS NTND Ext: no edema on LT  Rt leg bandaged NEURO:Ox3 CNI    . amLODipine  10 mg Oral Daily  . aspirin EC  81 mg Oral Daily  . calcium acetate  667 mg Oral TID WC  . cloNIDine  0.2 mg Oral BID  . darbepoetin (ARANESP) injection - DIALYSIS  40 mcg Intravenous Q Wed-HD  . docusate sodium  100 mg Oral BID  . doxercalciferol  3 mcg Intravenous Q M,W,F-HD  . enoxaparin (LOVENOX) injection  30 mg Subcutaneous Q24H  . ferric gluconate (FERRLECIT/NULECIT) IV  62.5 mg Intravenous Q Wed-HD  . insulin aspart  0-5 Units Subcutaneous QHS  . insulin aspart  0-9 Units Subcutaneous TID WC  . insulin aspart protamine-insulin aspart  5 Units Subcutaneous BID WC  . isosorbide mononitrate  30 mg Oral Daily  . levothyroxine  50 mcg Oral Q breakfast  . multivitamin  1 tablet Oral QHS  . pantoprazole  40 mg Oral Q1200  . simvastatin  10 mg Oral QHS  . Vitamin D (Ergocalciferol)  50,000 Units Oral Q Thu   Dg Ankle Complete Right  06/16/2012  *RADIOLOGY REPORT*  Clinical Data: Internal fixation for a tibial nonunion.  RIGHT ANKLE - COMPLETE 3+ VIEW  Comparison: CT 04/14/2012  Findings: Three intraoperative images demonstrate a surgical plate along the medial aspect of the tibia with screw fixation.  There is irregularity of the distal tibia and consistent with history of nonunion.  Again noted is plate and screw fixation of the distal fibula.  IMPRESSION: Internal fixation of the distal tibia.   Original Report Authenticated By: Richarda Overlie, M.D.    Dg Ankle Right Port  06/16/2012   *RADIOLOGY REPORT*  Clinical Data: Postop  PORTABLE RIGHT ANKLE - 2 VIEW  Comparison: 04/14/2012 CT.  Findings: Overlying cast obscures fine osseous and soft tissue detail.  Remote internal fixation distal fibula.  Side plate and screws placed across distal right tibial nonunion.  IMPRESSION: Internal fixation distal right tibia nonunion.   Original Report Authenticated By: Lacy Duverney, M.D.    BMET    Component Value Date/Time   NA 139 06/17/2012 1357   K 4.9 06/17/2012 1357   CL 100 06/17/2012 1357   CO2 26 06/17/2012 1357   GLUCOSE 157* 06/17/2012 1357   BUN 46* 06/17/2012 1357   CREATININE 8.93* 06/17/2012 1357   CALCIUM 8.3* 06/17/2012 1357   CALCIUM 7.6* 08/28/2009 1044   GFRNONAA 4* 06/17/2012 1357   GFRAA 4* 06/17/2012 1357   CBC    Component Value Date/Time   WBC 10.6* 06/17/2012 1357   RBC 3.11* 06/17/2012 1357   HGB 9.9* 06/17/2012 1357   HCT 30.6* 06/17/2012 1357   PLT 305 06/17/2012 1357   MCV 98.4 06/17/2012 1357   MCH 31.8 06/17/2012 1357   MCHC 32.4 06/17/2012 1357   RDW 15.9* 06/17/2012 1357   LYMPHSABS 1.1 06/16/2012 0638   MONOABS 0.9 06/16/2012 5409  EOSABS 0.4 06/16/2012 0638   BASOSABS 0.0 06/16/2012 9562     Assessment: 1. SP ORIF Rt Tibia 2. HTN 3. Anemia on aranesp 4. Sec HPTH on hectorol 5.ESRD  Plan: 1. HD tomorrow 2. Awaiting word from inpt rehab 3.  Her BP are being taken in her leg and thus are artificially high  Jamiel Goncalves T

## 2012-06-18 NOTE — Progress Notes (Signed)
Physical Therapy Treatment Patient Details Name: [name] MRN: [id number] DOB: Apr 07, 1928 Today's Date: 06/18/2012 Time: 4098-1191 PT Time Calculation (min): 17 min  PT Assessment / Plan / Recommendation Comments on Treatment Session  Patient progressing well and is very motivated. Continue to recommend CIR as patient motivated to return to PLOF    Follow Up Recommendations  CIR     Does the patient have the potential to tolerate intense rehabilitation     Barriers to Discharge        Equipment Recommendations  None recommended by PT    Recommendations for Other Services    Frequency Min 4X/week   Plan Discharge plan remains appropriate;Frequency remains appropriate    Precautions / Restrictions Precautions Precautions: Fall Required Braces or Orthoses: Other Brace/Splint Other Brace/Splint: Cast splint on Right lower leg and foot.  Restrictions Weight Bearing Restrictions: Yes RLE Weight Bearing: Non weight bearing   Pertinent Vitals/Pain     Mobility  Bed Mobility Bed Mobility: Supine to Sit;Sitting - Scoot to Edge of Bed Supine to Sit: 4: Min guard;HOB flat;With rails Sitting - Scoot to Edge of Bed: 4: Min assist;With rail Sit to Supine: 4: Min assist Details for Bed Mobility Assistance: Assist for Rt. LE Transfers Sit to Stand: 4: Min assist;With upper extremity assist;With armrests;From chair/3-in-1 Stand to Sit: 4: Min assist;To chair/3-in-1;With upper extremity assist;With armrests Details for Transfer Assistance: Pt demonstrates good abillity to maintain NWB. Cues for safe technique Ambulation/Gait Ambulation/Gait Assistance: 3: Mod assist Ambulation Distance (Feet): 12 Feet Assistive device: Rolling walker Ambulation/Gait Assistance Details: A for balance and stability as patient hops. Cues for RW positioning Gait Pattern: Step-to pattern    Exercises     PT Diagnosis:    PT Problem List:   PT Treatment Interventions:     PT Goals Acute Rehab  PT Goals PT Goal: Sit to Supine/Side - Progress: Progressing toward goal PT Transfer Goal: Bed to Chair/Chair to Bed - Progress: Progressing toward goal PT Goal: Ambulate - Progress: Progressing toward goal  Visit Information  Last PT Received On: 06/18/12 Assistance Needed: +1    Subjective Data      Cognition  Cognition Overall Cognitive Status: Appears within functional limits for tasks assessed/performed Arousal/Alertness: Awake/alert Orientation Level: Appears intact for tasks assessed Behavior During Session: [facility] for tasks performed    Balance     End of Session PT - End of Session Equipment Utilized During Treatment: Gait belt Activity Tolerance: Patient tolerated treatment well Patient left: in bed;with call bell/phone within reach;with family/visitor present   GP     Fredrich Birks 06/18/2012, 2:09 PM 06/18/2012 Fredrich Birks PTA [phone] pager [phone] office

## 2012-06-18 NOTE — Evaluation (Signed)
Occupational Therapy Evaluation Patient Details Name: [name] MRN: [id number] DOB: January 21, 1929 Today's Date: 06/18/2012 Time: 4098-1191 OT Time Calculation (min): 22 min  OT Assessment / Plan / Recommendation Clinical Impression   This 79 y.o. Female with h/o tibial fracture 2013 with non-union admitted for ORIF.  Pt will benefit from OT for the below listed deficits,  to maximize safety and independence with BADLs to allow her to return home with family at supervision to modified independent level after CIR.  Pt is very motivated, and anticipate good progress    OT Assessment  Patient needs continued OT Services    Follow Up Recommendations  CIR;Supervision/Assistance - 24 hour    Barriers to Discharge Decreased caregiver support Dtr works, but can hire caregiver  Equipment Recommendations  None recommended by OT    Recommendations for Other Services    Frequency  Min 2X/week    Precautions / Restrictions Precautions Precautions: Fall Required Braces or Orthoses: Other Brace/Splint Other Brace/Splint: Cast splint on Right lower leg and foot.  Restrictions Weight Bearing Restrictions: Yes RLE Weight Bearing: Non weight bearing       ADL  Eating/Feeding: Independent Where Assessed - Eating/Feeding: Chair Grooming: Wash/dry hands;Wash/dry face;Teeth care;Set up Where Assessed - Grooming: Supported sitting Upper Body Bathing: Set up Where Assessed - Upper Body Bathing: Supported sitting Lower Body Bathing: Moderate assistance Where Assessed - Lower Body Bathing: Supported sit to stand Upper Body Dressing: Set up Where Assessed - Upper Body Dressing: Supported sitting;Unsupported sitting Lower Body Dressing: +1 Total assistance Where Assessed - Lower Body Dressing: Supported sit to stand Toilet Transfer: Minimal assistance Toilet Transfer Method: Surveyor, minerals: Other (comment) (simulated to recliner) Toileting - Clothing Manipulation and  Hygiene: +1 Total assistance Where Assessed - Toileting Clothing Manipulation and Hygiene: Standing Equipment Used: Rolling walker Transfers/Ambulation Related to ADLs: stand pivot transfer with min A1 ADL Comments: Pt limited by pain.      OT Diagnosis: Generalized weakness;Acute pain  OT Problem List: Decreased strength;Decreased activity tolerance;Decreased knowledge of use of DME or AE;Pain OT Treatment Interventions: Self-care/ADL training;DME and/or AE instruction;Therapeutic activities;Patient/family education   OT Goals Acute Rehab OT Goals OT Goal Formulation: With patient Time For Goal Achievement: 06/25/12 Potential to Achieve Goals: Good ADL Goals Pt Will Perform Grooming: Standing at sink (min guard assist) ADL Goal: Grooming - Progress: Goal set today Pt Will Perform Lower Body Bathing: Sit to stand from chair;Sit to stand from bed (min guard assist) ADL Goal: Lower Body Bathing - Progress: Goal set today Pt Will Perform Lower Body Dressing: Sit to stand from chair;Sit to stand from bed (min guard assist) ADL Goal: Lower Body Dressing - Progress: Goal set today Pt Will Transfer to Toilet: Ambulation;3-in-1;Maintaining weight bearing status (min guard assist) ADL Goal: Toilet Transfer - Progress: Goal set today Pt Will Perform Toileting - Clothing Manipulation: Standing (min guard assist) ADL Goal: Toileting - Clothing Manipulation - Progress: Goal set today  Visit Information  Last OT Received On: 06/18/12 Assistance Needed: +1    Subjective Data  Subjective: "I want to get to that chair today" Patient Stated Goal: To regain indepedence   Prior Functioning     Home Living Lives With: Daughter Available Help at Discharge: Family;Available PRN/intermittently;Personal care attendant (care attendant 8 hours per day) Type of Home: House Home Access: Stairs to enter [employer] of Steps: 6 Entrance Stairs-Rails: Left Home Layout: One level Bathroom  Shower/Tub: Forensic scientist: Standard Home Adaptive Equipment: Dan Humphreys -  rolling;Bedside commode/3-in-1 Prior Function Level of Independence: Needs assistance Needs Assistance: Meal Prep;Light Housekeeping;Dressing Communication Communication: No difficulties Dominant Hand: Right         Vision/Perception     Cognition  Cognition Overall Cognitive Status: Appears within functional limits for tasks assessed/performed Arousal/Alertness: Awake/alert Orientation Level: Appears intact for tasks assessed Behavior During Session: [facility] for tasks performed    Extremity/Trunk Assessment Right Upper Extremity Assessment RUE ROM/Strength/Tone: Within functional levels RUE Coordination: WFL - gross/fine motor Left Upper Extremity Assessment LUE ROM/Strength/Tone: Within functional levels LUE Coordination: WFL - gross/fine motor Trunk Assessment Trunk Assessment: Normal     Mobility Bed Mobility Bed Mobility: Supine to Sit;Sitting - Scoot to Edge of Bed Supine to Sit: 4: Min guard;HOB flat;With rails Sitting - Scoot to Edge of Bed: 4: Min assist;With rail Details for Bed Mobility Assistance: Assist for Rt. LE Transfers Transfers: Sit to Stand;Stand to Sit Sit to Stand: 4: Min assist;With upper extremity assist;From bed Stand to Sit: 4: Min assist;With upper extremity assist;To chair/3-in-1 Details for Transfer Assistance: Pt demonstrates good abillity to maintain NWB.     Exercise     Balance     End of Session OT - End of Session Activity Tolerance: Patient limited by pain Patient left: in chair;with call bell/phone within reach Nurse Communication: Patient requests pain meds  GO     Tiziana Cislo M 06/18/2012, 11:49 AM

## 2012-06-18 NOTE — Consult Note (Signed)
Physical Medicine and Rehabilitation Consult Reason for Consult: Right tibia nonunion Referring Physician: Dr. Carola Frost   HPI: [name] is a 79 y.o. right-handed female with history of end-stage renal disease with hemodialysis, diastolic congestive heart failure, diabetes mellitus with peripheral neuropathy. Admitted 06/15/2012 with history of right distal 1/3 tibial shaft fracture in April 2013 treated nonoperatively with numerous modalities including splinting, cast and a CAM boot with bone stimulator. Patient with continued pain at fracture site. Underwent repair of right tibia nonunion with open reduction internal fixation and infuse allografting 06/16/2012 per Dr. Carola Frost. Nonweightbearing right lower extremity x6 weeks. Subcutaneous Lovenox for DVT prophylaxis. Postoperative pain management. Hemodialysis ongoing per renal services. Physical occupational therapy evaluations completed an ongoing with recommendations of physical medicine rehabilitation consult to consider inpatient rehabilitation services. Patient states she tolerated therapy well today. She would like to go home but does not feel ready yet. Daughter works at [employer] and is unable to take any more time off.  Review of Systems  Respiratory: Positive for cough.   Gastrointestinal: Positive for constipation.  Musculoskeletal: Positive for myalgias and joint pain.  All other systems reviewed and are negative.   Past Medical History  Diagnosis Date  . CHF (congestive heart failure)   . DM2 (diabetes mellitus, type 2)   . ESRD (end stage renal disease)     HD TTS - [facility]  . HTN (hypertension)   . Hyperlipidemia   . Tibia/fibula fracture 07/10/2011    right   . Hypothyroid   . Cancer     Breast  . Arthritis   . History of blood transfusion    Past Surgical History  Procedure Laterality Date  . Mastectomy    . Abdominal surgery    . Thyroid surgery    . Breast surgery Left     Mastectomy  . Eye  surgery Bilateral     Cataract  . Ankle fusion Right   . Arteriovenous graft placement Right    Family History  Problem Relation Age of Onset  . Hyperlipidemia Father   . Diabetes Father   . Breast cancer Sister    Social History:  reports that she has never smoked. Her smokeless tobacco use includes Snuff. She reports that she does not drink alcohol or use illicit drugs. Allergies:  Allergies  Allergen Reactions  . Codeine Swelling   Medications Prior to Admission  Medication Sig Dispense Refill  . acetaminophen (TYLENOL) 650 MG CR tablet Take 650 mg by mouth every 8 (eight) hours as needed. arthritis      . amLODipine (NORVASC) 10 MG tablet Take 10 mg by mouth daily.      Marland Kitchen aspirin EC 81 MG tablet Take 81 mg by mouth daily.      . B Complex-C-Folic Acid (VOL-CARE RX) 1 MG TABS Take by mouth at bedtime.      . calcium acetate (PHOSLO) 667 MG capsule Take 667 mg by mouth 3 (three) times daily with meals.      . cloNIDine (CATAPRES) 0.2 MG tablet Take 0.2 mg by mouth 2 (two) times daily.      . ergocalciferol (VITAMIN D2) 50000 UNITS capsule Take 50,000 Units by mouth every Thursday. Usually on Thursday      . insulin lispro protamine-insulin lispro (HUMALOG MIX 75/25 KWIKPEN) (75-25) 100 UNIT/ML SUSP Inject 5-10 Units into the skin 2 (two) times daily. 5 units AM and 10 units PM  10 mL  12  . isosorbide mononitrate (IMDUR)  30 MG 24 hr tablet Take 30 mg by mouth daily.      Marland Kitchen levothyroxine (SYNTHROID, LEVOTHROID) 50 MCG tablet Take 50 mcg by mouth daily.      . pantoprazole (PROTONIX) 40 MG tablet Take 1 tablet (40 mg total) by mouth daily at 12 noon.  30 tablet  0  . simvastatin (ZOCOR) 10 MG tablet Take 10 mg by mouth at bedtime.      . vitamin E 400 UNIT capsule Take 400 Units by mouth daily.        Home: Home Living Lives With: Daughter Available Help at Discharge: Family;Available PRN/intermittently;Personal care attendant (care attendant 8 hours per day) Type of Home:  House Home Access: Stairs to enter [employer] of Steps: 6 Entrance Stairs-Rails: Left Home Layout: One level Bathroom Shower/Tub: Forensic scientist: Standard Home Adaptive Equipment: Walker - rolling;Bedside commode/3-in-1  Functional History:   Functional Status:  Mobility: Bed Mobility Bed Mobility: Supine to Sit;Sit to Supine;Sitting - Scoot to Edge of Bed Supine to Sit: 4: Min assist;HOB elevated Sitting - Scoot to [place] of Bed: 3: Mod assist;With rail Sit to Supine: 3: Mod assist;HOB flat Transfers Transfers: Sit to Stand;Stand to Sit Sit to Stand: 1: +2 Total assist;With upper extremity assist;From bed Sit to Stand: Patient Percentage: 60% Stand to Sit: 3: Mod assist Ambulation/Gait Ambulation/Gait Assistance: Not tested (comment) Stairs: No    ADL:    Cognition: Cognition Arousal/Alertness: Awake/alert Orientation Level: Oriented X4 Cognition Overall Cognitive Status: Appears within functional limits for tasks assessed/performed Arousal/Alertness: Awake/alert Orientation Level: Appears intact for tasks assessed Behavior During Session: [facility] for tasks performed  Blood pressure 105/55, pulse 72, temperature 98.5 F (36.9 C), temperature source Oral, resp. rate 19, height 5\' 2"  (1.575 m), weight 74.3 kg (163 lb 12.8 oz), SpO2 94.00%. Physical Exam  Vitals reviewed. Constitutional: She is oriented to person, place, and time. She appears well-developed.  HENT:  Head: Normocephalic.  Eyes: EOM are normal.  Neck: Neck supple. No thyromegaly present.  Cardiovascular: Normal rate and regular rhythm.   Pulmonary/Chest: Effort normal and breath sounds normal. No respiratory distress.  Abdominal: Bowel sounds are normal. She exhibits no distension.  Neurological: She is alert and oriented to person, place, and time.  Skin:  Cast RLE  Psychiatric: She has a normal mood and affect.  4/5 bilateral deltoid, biceps, triceps, grip, left hip  flexor knee extensor ankle dorsiflexor plantar flexor 3/5 right hip flexor 4 minus/5 knee extensor ankle not tested secondary to splint Sensation intact to light touch in both feet  Results for orders placed during the hospital encounter of 06/16/12 (from the past 24 hour(s))  GLUCOSE, CAPILLARY     Status: Abnormal   Collection Time    06/17/12 11:15 AM      Result Value Range   Glucose-Capillary 53 (*) 70 - 99 mg/dL   Comment 1 Notify RN    GLUCOSE, CAPILLARY     Status: Abnormal   Collection Time    06/17/12 11:18 AM      Result Value Range   Glucose-Capillary 50 (*) 70 - 99 mg/dL  GLUCOSE, CAPILLARY     Status: None   Collection Time    06/17/12 12:04 PM      Result Value Range   Glucose-Capillary 81  70 - 99 mg/dL  CBC     Status: Abnormal   Collection Time    06/17/12  1:57 PM      Result Value Range  WBC 10.6 (*) 4.0 - 10.5 K/uL   RBC 3.11 (*) 3.87 - 5.11 MIL/uL   Hemoglobin 9.9 (*) 12.0 - 15.0 g/dL   HCT 16.1 (*) 09.6 - 04.5 %   MCV 98.4  78.0 - 100.0 fL   MCH 31.8  26.0 - 34.0 pg   MCHC 32.4  30.0 - 36.0 g/dL   RDW 40.9 (*) 81.1 - 91.4 %   Platelets 305  150 - 400 K/uL  PHOSPHORUS     Status: Abnormal   Collection Time    06/17/12  1:57 PM      Result Value Range   Phosphorus 5.9 (*) 2.3 - 4.6 mg/dL  COMPREHENSIVE METABOLIC PANEL     Status: Abnormal   Collection Time    06/17/12  1:57 PM      Result Value Range   Sodium 139  135 - 145 mEq/L   Potassium 4.9  3.5 - 5.1 mEq/L   Chloride 100  96 - 112 mEq/L   CO2 26  19 - 32 mEq/L   Glucose, Bld 157 (*) 70 - 99 mg/dL   BUN 46 (*) 6 - 23 mg/dL   Creatinine, Ser 7.82 (*) 0.50 - 1.10 mg/dL   Calcium 8.3 (*) 8.4 - 10.5 mg/dL   Total Protein 7.0  6.0 - 8.3 g/dL   Albumin 3.3 (*) 3.5 - 5.2 g/dL   AST 29  0 - 37 U/L   ALT 7  0 - 35 U/L   Alkaline Phosphatase 99  39 - 117 U/L   Total Bilirubin 0.2 (*) 0.3 - 1.2 mg/dL   GFR calc non Af Amer 4 (*) >90 mL/min   GFR calc Af Amer 4 (*) >90 mL/min  HEMOGLOBIN A1C      Status: Abnormal   Collection Time    06/17/12  1:57 PM      Result Value Range   Hemoglobin A1C 6.1 (*) <5.7 %   Mean Plasma Glucose 128 (*) <117 mg/dL  GLUCOSE, CAPILLARY     Status: Abnormal   Collection Time    06/17/12  2:02 PM      Result Value Range   Glucose-Capillary 144 (*) 70 - 99 mg/dL  GLUCOSE, CAPILLARY     Status: Abnormal   Collection Time    06/17/12  6:49 PM      Result Value Range   Glucose-Capillary 116 (*) 70 - 99 mg/dL  GLUCOSE, CAPILLARY     Status: Abnormal   Collection Time    06/17/12  9:22 PM      Result Value Range   Glucose-Capillary 202 (*) 70 - 99 mg/dL  GLUCOSE, CAPILLARY     Status: Abnormal   Collection Time    06/18/12  6:51 AM      Result Value Range   Glucose-Capillary 139 (*) 70 - 99 mg/dL   Dg Ankle Complete Right  06/16/2012  *RADIOLOGY REPORT*  Clinical Data: Internal fixation for a tibial nonunion.  RIGHT ANKLE - COMPLETE 3+ VIEW  Comparison: CT 04/14/2012  Findings: Three intraoperative images demonstrate a surgical plate along the medial aspect of the tibia with screw fixation.  There is irregularity of the distal tibia and consistent with history of nonunion.  Again noted is plate and screw fixation of the distal fibula.  IMPRESSION: Internal fixation of the distal tibia.   Original Report Authenticated By: Richarda Overlie, M.D.    Dg Ankle Right Port  06/16/2012  *RADIOLOGY REPORT*  Clinical Data: Postop  PORTABLE RIGHT ANKLE - 2 VIEW  Comparison: 04/14/2012 CT.  Findings: Overlying cast obscures fine osseous and soft tissue detail.  Remote internal fixation distal fibula.  Side plate and screws placed across distal right tibial nonunion.  IMPRESSION: Internal fixation distal right tibia nonunion.   Original Report Authenticated By: Lacy Duverney, M.D.     Assessment/Plan: Diagnosis: Right distal tibial fracture postoperative day #2 status post ORIF 1. Does the need for close, 24 hr/day medical supervision in concert with the patient's rehab  needs make it unreasonable for this patient to be served in a less intensive setting? Yes 2. Co-Morbidities requiring supervision/potential complications: End-stage renal disease, diabetes type 2, hypertension, anemia 3. Due to bowel management, safety, skin/wound care, disease management, medication administration, pain management and patient education, does the patient require 24 hr/day rehab nursing? Yes 4. Does the patient require coordinated care of a physician, rehab nurse, PT (1-2 hrs/day, 5 days/week) and OT (1-2 hrs/day, 5 days/week) to address physical and functional deficits in the context of the above medical diagnosis(es)? Yes Addressing deficits in the following areas: balance, endurance, locomotion, strength, transferring, bowel/bladder control, bathing, dressing, feeding, grooming and toileting 5. Can the patient actively participate in an intensive therapy program of at least 3 hrs of therapy per day at least 5 days per week? Yes 6. The potential for patient to make measurable gains while on inpatient rehab is good 7. Anticipated functional outcomes upon discharge from inpatient rehab are Supervision to modified independent mobility should be modified independent with wheelchair level with PT, Supervision modified independent level in chair for ADLs with OT, Not applicable with SLP. 8. Estimated rehab length of stay to reach the above functional goals is: 7 days 9. Does the patient have adequate social supports to accommodate these discharge functional goals? Yes 10. Anticipated D/C setting: Home 11. Anticipated post D/C treatments: HH therapy 12. Overall Rehab/Functional Prognosis: good  RECOMMENDATIONS: This patient's condition is appropriate for continued rehabilitative care in the following setting: CIR Patient has agreed to participate in recommended program. Yes Note that insurance prior authorization may be required for reimbursement for recommended  care.  Comment:    06/18/2012

## 2012-06-18 NOTE — Progress Notes (Signed)
Rehab Admissions Coordinator Note:  Patient was screened by Trish Mage for appropriateness for an Inpatient Acute Rehab Consult.  At this time, we are recommending Inpatient Rehab consult.  Trish Mage 06/18/2012, 8:43 AM  I can be reached at [phone].

## 2012-06-18 NOTE — Care Management Note (Signed)
CARE MANAGEMENT NOTE 06/18/2012  Patient:  [name], [name]   Account Number:  [id number]  Date Initiated:  06/18/2012  Documentation initiated by:  Vance Peper  Subjective/Objective Assessment:   79 yr old female s/p right tibia ORIF.     Action/Plan:   patient and family interested in CIR. waiting for MD to see.   Anticipated DC Date:  06/20/2012   Anticipated DC Plan:  IP REHAB FACILITY      DC Planning Services  CM consult      Choice offered to / List presented to:             Status of service:  In process, will continue to follow Medicare Important Message given?   (If response is "NO", the following Medicare IM given date fields will be blank) Date Medicare IM given:   Date Additional Medicare IM given:    Discharge Disposition:    Per UR Regulation:    If discussed at Long Length of Stay Meetings, dates discussed:    Comments:

## 2012-06-19 ENCOUNTER — Inpatient Hospital Stay (HOSPITAL_COMMUNITY)
Admission: RE | Admit: 2012-06-19 | Discharge: 2012-06-25 | DRG: 945 | Disposition: A | Discharge status: Other Acute Inpatient Hospital | Payer: Medicare Other | Source: Intra-hospital | Attending: Physical Medicine & Rehabilitation | Admitting: Physical Medicine & Rehabilitation

## 2012-06-19 ENCOUNTER — Encounter (HOSPITAL_COMMUNITY): Payer: Self-pay | Admitting: Orthopedic Surgery

## 2012-06-19 DIAGNOSIS — E1165 Type 2 diabetes mellitus with hyperglycemia: Secondary | ICD-10-CM

## 2012-06-19 DIAGNOSIS — N186 End stage renal disease: Secondary | ICD-10-CM | POA: Diagnosis present

## 2012-06-19 DIAGNOSIS — M129 Arthropathy, unspecified: Secondary | ICD-10-CM | POA: Diagnosis present

## 2012-06-19 DIAGNOSIS — S82201A Unspecified fracture of shaft of right tibia, initial encounter for closed fracture: Secondary | ICD-10-CM

## 2012-06-19 DIAGNOSIS — Z5189 Encounter for other specified aftercare: Principal | ICD-10-CM

## 2012-06-19 DIAGNOSIS — S82209A Unspecified fracture of shaft of unspecified tibia, initial encounter for closed fracture: Secondary | ICD-10-CM

## 2012-06-19 DIAGNOSIS — W19XXXA Unspecified fall, initial encounter: Secondary | ICD-10-CM

## 2012-06-19 DIAGNOSIS — R000 Tachycardia, unspecified: Secondary | ICD-10-CM | POA: Diagnosis not present

## 2012-06-19 DIAGNOSIS — N2581 Secondary hyperparathyroidism of renal origin: Secondary | ICD-10-CM | POA: Diagnosis present

## 2012-06-19 DIAGNOSIS — I120 Hypertensive chronic kidney disease with stage 5 chronic kidney disease or end stage renal disease: Secondary | ICD-10-CM | POA: Diagnosis present

## 2012-06-19 DIAGNOSIS — E785 Hyperlipidemia, unspecified: Secondary | ICD-10-CM | POA: Diagnosis present

## 2012-06-19 DIAGNOSIS — E1149 Type 2 diabetes mellitus with other diabetic neurological complication: Secondary | ICD-10-CM | POA: Diagnosis present

## 2012-06-19 DIAGNOSIS — K5900 Constipation, unspecified: Secondary | ICD-10-CM | POA: Diagnosis not present

## 2012-06-19 DIAGNOSIS — W010XXA Fall on same level from slipping, tripping and stumbling without subsequent striking against object, initial encounter: Secondary | ICD-10-CM | POA: Diagnosis present

## 2012-06-19 DIAGNOSIS — E039 Hypothyroidism, unspecified: Secondary | ICD-10-CM | POA: Diagnosis present

## 2012-06-19 DIAGNOSIS — M898X9 Other specified disorders of bone, unspecified site: Secondary | ICD-10-CM | POA: Diagnosis present

## 2012-06-19 DIAGNOSIS — F17200 Nicotine dependence, unspecified, uncomplicated: Secondary | ICD-10-CM | POA: Diagnosis present

## 2012-06-19 DIAGNOSIS — E1142 Type 2 diabetes mellitus with diabetic polyneuropathy: Secondary | ICD-10-CM | POA: Diagnosis present

## 2012-06-19 DIAGNOSIS — Y92009 Unspecified place in unspecified non-institutional (private) residence as the place of occurrence of the external cause: Secondary | ICD-10-CM

## 2012-06-19 DIAGNOSIS — K3189 Other diseases of stomach and duodenum: Secondary | ICD-10-CM | POA: Diagnosis not present

## 2012-06-19 DIAGNOSIS — I509 Heart failure, unspecified: Secondary | ICD-10-CM | POA: Diagnosis present

## 2012-06-19 DIAGNOSIS — Z853 Personal history of malignant neoplasm of breast: Secondary | ICD-10-CM

## 2012-06-19 DIAGNOSIS — E889 Metabolic disorder, unspecified: Secondary | ICD-10-CM | POA: Diagnosis present

## 2012-06-19 DIAGNOSIS — D649 Anemia, unspecified: Secondary | ICD-10-CM | POA: Diagnosis present

## 2012-06-19 DIAGNOSIS — Z794 Long term (current) use of insulin: Secondary | ICD-10-CM

## 2012-06-19 DIAGNOSIS — S82409A Unspecified fracture of shaft of unspecified fibula, initial encounter for closed fracture: Secondary | ICD-10-CM | POA: Diagnosis present

## 2012-06-19 DIAGNOSIS — IMO0001 Reserved for inherently not codable concepts without codable children: Secondary | ICD-10-CM

## 2012-06-19 DIAGNOSIS — Z992 Dependence on renal dialysis: Secondary | ICD-10-CM

## 2012-06-19 DIAGNOSIS — I5031 Acute diastolic (congestive) heart failure: Secondary | ICD-10-CM | POA: Diagnosis present

## 2012-06-19 LAB — GLUCOSE, CAPILLARY
Glucose-Capillary: 132 mg/dL — ABNORMAL HIGH (ref 70–99)
Glucose-Capillary: 198 mg/dL — ABNORMAL HIGH (ref 70–99)

## 2012-06-19 LAB — CBC WITH DIFFERENTIAL/PLATELET
Eosinophils Absolute: 0.5 10*3/uL (ref 0.0–0.7)
Eosinophils Relative: 4 % (ref 0–5)
Hemoglobin: 10 g/dL — ABNORMAL LOW (ref 12.0–15.0)
Lymphocytes Relative: 7 % — ABNORMAL LOW (ref 12–46)
Lymphs Abs: 0.8 10*3/uL (ref 0.7–4.0)
MCH: 31.4 pg (ref 26.0–34.0)
MCV: 98.1 fL (ref 78.0–100.0)
Monocytes Relative: 12 % (ref 3–12)
Neutrophils Relative %: 77 % (ref 43–77)
RBC: 3.18 MIL/uL — ABNORMAL LOW (ref 3.87–5.11)

## 2012-06-19 LAB — TISSUE CULTURE: Culture: NO GROWTH

## 2012-06-19 MED ORDER — SODIUM CHLORIDE 0.9 % IV SOLN
62.5000 mg | INTRAVENOUS | Status: DC
  Administered 2012-06-24: 62.5 mg via INTRAVENOUS
  Filled 2012-06-19 (×3): qty 5

## 2012-06-19 MED ORDER — ONDANSETRON HCL 4 MG/2ML IJ SOLN: 4.0000 mg | Freq: Four times a day (QID) | INTRAMUSCULAR | Status: DC | PRN

## 2012-06-19 MED ORDER — LEVOTHYROXINE SODIUM 50 MCG PO TABS
50.0000 ug | ORAL_TABLET | Freq: Every day | ORAL | Status: DC
  Administered 2012-06-20 – 2012-06-24 (×5): 50 ug via ORAL
  Filled 2012-06-19 (×7): qty 1

## 2012-06-19 MED ORDER — ISOSORBIDE MONONITRATE ER 30 MG PO TB24
30.0000 mg | ORAL_TABLET | Freq: Every day | ORAL | Status: DC
  Administered 2012-06-20 – 2012-06-24 (×5): 30 mg via ORAL
  Filled 2012-06-19 (×7): qty 1

## 2012-06-19 MED ORDER — CLONIDINE HCL 0.2 MG PO TABS
0.2000 mg | ORAL_TABLET | Freq: Two times a day (BID) | ORAL | Status: DC
  Administered 2012-06-19 – 2012-06-24 (×10): 0.2 mg via ORAL
  Filled 2012-06-19 (×14): qty 1

## 2012-06-19 MED ORDER — SENNOSIDES-DOCUSATE SODIUM 8.6-50 MG PO TABS: 1.0000 | ORAL_TABLET | Freq: Every evening | ORAL | Status: DC | PRN

## 2012-06-19 MED ORDER — BISACODYL 10 MG RE SUPP: 10.0000 mg | Freq: Every day | RECTAL | Status: DC | PRN

## 2012-06-19 MED ORDER — SORBITOL 70 % SOLN
30.0000 mL | Freq: Every day | Status: DC | PRN
  Administered 2012-06-21: 30 mL via ORAL
  Filled 2012-06-19 (×2): qty 30

## 2012-06-19 MED ORDER — PANTOPRAZOLE SODIUM 40 MG PO TBEC
40.0000 mg | DELAYED_RELEASE_TABLET | Freq: Every day | ORAL | Status: DC
  Administered 2012-06-20 – 2012-06-24 (×5): 40 mg via ORAL
  Filled 2012-06-19 (×5): qty 1

## 2012-06-19 MED ORDER — SIMVASTATIN 10 MG PO TABS
10.0000 mg | ORAL_TABLET | Freq: Every day | ORAL | Status: DC
  Administered 2012-06-19 – 2012-06-24 (×6): 10 mg via ORAL
  Filled 2012-06-19 (×7): qty 1

## 2012-06-19 MED ORDER — ONDANSETRON HCL 4 MG PO TABS: 4.0000 mg | ORAL_TABLET | Freq: Four times a day (QID) | ORAL | Status: DC | PRN

## 2012-06-19 MED ORDER — DARBEPOETIN ALFA-POLYSORBATE 40 MCG/0.4ML IJ SOLN
40.0000 ug | INTRAMUSCULAR | Status: DC
  Administered 2012-06-24: 40 ug via INTRAVENOUS
  Filled 2012-06-19: qty 0.4

## 2012-06-19 MED ORDER — ACETAMINOPHEN 325 MG PO TABS: 325.0000 mg | ORAL_TABLET | ORAL | Status: DC | PRN

## 2012-06-19 MED ORDER — ASPIRIN EC 81 MG PO TBEC
81.0000 mg | DELAYED_RELEASE_TABLET | Freq: Every day | ORAL | Status: DC
  Administered 2012-06-20 – 2012-06-24 (×5): 81 mg via ORAL
  Filled 2012-06-19 (×7): qty 1

## 2012-06-19 MED ORDER — AMLODIPINE BESYLATE 10 MG PO TABS
10.0000 mg | ORAL_TABLET | Freq: Every day | ORAL | Status: DC
  Administered 2012-06-20 – 2012-06-23 (×4): 10 mg via ORAL
  Filled 2012-06-19 (×7): qty 1

## 2012-06-19 MED ORDER — OXYCODONE HCL 5 MG PO TABS
5.0000 mg | ORAL_TABLET | ORAL | Status: DC | PRN
  Administered 2012-06-19 – 2012-06-22 (×5): 5 mg via ORAL
  Filled 2012-06-19 (×5): qty 1

## 2012-06-19 MED ORDER — INSULIN ASPART PROT & ASPART (70-30 MIX) 100 UNIT/ML ~~LOC~~ SUSP
5.0000 [IU] | Freq: Two times a day (BID) | SUBCUTANEOUS | Status: DC
  Administered 2012-06-19 – 2012-06-20 (×2): 5 [IU] via SUBCUTANEOUS
  Filled 2012-06-19: qty 10

## 2012-06-19 MED ORDER — RENA-VITE PO TABS
1.0000 | ORAL_TABLET | Freq: Every day | ORAL | Status: DC
  Administered 2012-06-19 – 2012-06-24 (×6): 1 via ORAL
  Filled 2012-06-19 (×7): qty 1

## 2012-06-19 MED ORDER — VITAMIN D (ERGOCALCIFEROL) 1.25 MG (50000 UNIT) PO CAPS
50000.0000 [IU] | ORAL_CAPSULE | ORAL | Status: DC
Start: 2012-06-25 — End: 2012-06-25

## 2012-06-19 MED ORDER — INSULIN ASPART 100 UNIT/ML ~~LOC~~ SOLN
0.0000 [IU] | Freq: Every day | SUBCUTANEOUS | Status: DC
  Administered 2012-06-23: 2 [IU] via SUBCUTANEOUS

## 2012-06-19 MED ORDER — ENOXAPARIN SODIUM 30 MG/0.3ML ~~LOC~~ SOLN
30.0000 mg | SUBCUTANEOUS | Status: DC
  Administered 2012-06-19 – 2012-06-24 (×6): 30 mg via SUBCUTANEOUS
  Filled 2012-06-19 (×6): qty 0.3

## 2012-06-19 MED ORDER — CALCIUM ACETATE 667 MG PO CAPS
667.0000 mg | ORAL_CAPSULE | Freq: Three times a day (TID) | ORAL | Status: DC
  Administered 2012-06-19 – 2012-06-24 (×15): 667 mg via ORAL
  Filled 2012-06-19 (×20): qty 1

## 2012-06-19 MED ORDER — DOXERCALCIFEROL 4 MCG/2ML IV SOLN
3.0000 ug | INTRAVENOUS | Status: DC
  Filled 2012-06-19 (×2): qty 2

## 2012-06-19 MED ORDER — ENOXAPARIN SODIUM 30 MG/0.3ML ~~LOC~~ SOLN: 30.0000 mg | SUBCUTANEOUS | Status: DC

## 2012-06-19 NOTE — Progress Notes (Addendum)
Chaplain Note: 11:50AM..Marland KitchenI stopped by to visit pt and pray with her. Was informed by unit secretary that pt was not in her room...currently having a procedure done. Not sure when she will be back.  Will check back later this afternoon.  4:00PM stopped by [facility] and was informed pt had moved to rehab. Found pt in her new room and visited with her briefly since the nurses were in the process of getting her situated.  Pt very friendly, upbeat and welcoming. Will follow up Tuesday.  Rutherford Nail Chaplain Resident

## 2012-06-19 NOTE — Procedures (Signed)
Pt seen on HD.  Ap 60 Vp 200.  Lt arm BP 160/67   Compared to leg BP 190/71.  Will use Lt arm for BP.

## 2012-06-19 NOTE — Interval H&P Note (Signed)
[name] was admitted today to Inpatient Rehabilitation with the diagnosis of non-union right tibial fx, s/p ORIF.  The patient's history has been reviewed, patient examined, and there is no change in status.  Patient continues to be appropriate for intensive inpatient rehabilitation.  I have reviewed the patient's chart and labs.  Questions were answered to the patient's satisfaction.  Hyrum Shaneyfelt T 06/19/2012, 4:21 PM

## 2012-06-19 NOTE — PMR Pre-admission (Signed)
PMR Admission Coordinator Pre-Admission Assessment  Patient: [name] is an 79 y.o., female MRN: [id number] DOB: 1929/03/16 Height: 5\' 2"  (157.5 cm) Weight: 75.9 kg (167 lb 5.3 oz)              Insurance Information HMO:      PPO:       PCP:       IPA:       80/20:       OTHER:  yes PRIMARY: Medicare      Policy#:  [id number]      Subscriber: pt     Employer: retired Benefits:       Name: Armed forces technical officer. Date: "A" 12/17/87; "B" 08/17/95     Deduct: $1216.00      Out of Pocket Max:        Life Max:   CIR: 100% after deduct      SNF: days 1-20 100%; days 21-100 copay/day $152.00 Outpatient: 80%     Co-Pay: 20% Home Health: 100%      Co-Pay: 20% equipment used DME: 80%     Co-Pay: 20% Providers: pt's choice SECONDARY: [employer]      Policy#: [id number]  Plan B      Subscriber: pt  MC "D" = [employer]  Emergency Contact Information Contact Information   Name Relation Home Work Baden [[name]] Daughter [phone]     Current Medical History  Patient Admitting Diagnosis: Right distal tibial fracture status post ORIF  History of Present Illness: 79 y.o. right-handed female with history of end-stage renal disease with hemodialysis, diastolic congestive heart failure, diabetes mellitus with peripheral neuropathy. Admitted 06/15/2012 with history of right distal 1/3 tibial shaft fracture in April 2013 treated nonoperatively with numerous modalities including splinting, cast and a CAM boot with bone stimulator. Patient with continued pain at fracture site. Underwent repair of right tibia nonunion with open reduction internal fixation and infuse allografting 06/16/2012 per Dr. Carola Frost. Nonweightbearing right lower extremity x6 weeks. Subcutaneous Lovenox for DVT prophylaxis. Postoperative pain management. Hemodialysis ongoing per renal services. Physical occupational therapy evaluations completed an ongoing with recommendations of physical medicine  rehabilitation consult to consider inpatient rehabilitation services.  Patient states she tolerated therapy well today. She would like to go home but does not feel ready yet. Daughter works at [employer].       Past Medical History  Past Medical History  Diagnosis Date  . CHF (congestive heart failure)   . DM2 (diabetes mellitus, type 2)   . ESRD (end stage renal disease)     HD TTS - [facility]  . HTN (hypertension)   . Hyperlipidemia   . Tibia/fibula fracture 07/10/2011    right   . Hypothyroid   . Cancer     Breast  . Arthritis   . History of blood transfusion   . Hyperparathyroidism, secondary renal     Family History  family history includes Breast cancer in her sister; Diabetes in her father; and Hyperlipidemia in her father.  Prior Rehab/Hospitalizations: Had HH txs last year when she first sustained tibial fx.   Current Medications  Current facility-administered medications:0.9 %  sodium chloride infusion, , Intravenous, Continuous, Mearl Latin, PA-C, Last Rate: 20 mL/hr at 06/17/12 0948;  amLODipine (NORVASC) tablet 10 mg, 10 mg, Oral, Daily, Mearl Latin, PA-C, 10 mg at 06/18/12 1013;  aspirin EC tablet 81 mg, 81 mg, Oral, Daily, Mearl Latin, PA-C, 81 mg at 06/18/12 1013;  bisacodyl (DULCOLAX) suppository 10 mg, 10 mg, Rectal, Daily PRN, Mearl Latin, PA-C calcium acetate (PHOSLO) capsule 667 mg, 667 mg, Oral, TID WC, Mearl Latin, PA-C, 667 mg at 06/19/12 1610;  cloNIDine (CATAPRES) tablet 0.2 mg, 0.2 mg, Oral, BID, Mearl Latin, PA-C, 0.2 mg at 06/18/12 1950;  darbepoetin (ARANESP) injection 40 mcg, 40 mcg, Intravenous, Q Wed-HD, Sheffield Slider, PA-C, 40 mcg at 06/17/12 1527;  docusate sodium (COLACE) capsule 100 mg, 100 mg, Oral, BID, Mearl Latin, PA-C, 100 mg at 06/18/12 1951 doxercalciferol (HECTOROL) injection 3 mcg, 3 mcg, Intravenous, Q M,W,F-HD, Sheffield Slider, PA-C, 3 mcg at 06/19/12 0830;  enoxaparin (LOVENOX) injection 30 mg, 30 mg,  Subcutaneous, Q24H, Mearl Latin, PA-C, 30 mg at 06/18/12 1951;  ferric gluconate (NULECIT) 62.5 mg in sodium chloride 0.9 % 100 mL IVPB, 62.5 mg, Intravenous, Q Wed-HD, Sheffield Slider, PA-C, 62.5 mg at 06/17/12 1752 insulin aspart (novoLOG) injection 0-5 Units, 0-5 Units, Subcutaneous, QHS, Mearl Latin, PA-C;  insulin aspart (novoLOG) injection 0-9 Units, 0-9 Units, Subcutaneous, TID WC, Mearl Latin, PA-C, 5 Units at 06/17/12 [phone];  insulin aspart protamine-insulin aspart (NOVOLOG 70/30) injection 5 Units, 5 Units, Subcutaneous, BID WC, Mearl Latin, PA-C, 5 Units at 06/18/12 0800 isosorbide mononitrate (IMDUR) 24 hr tablet 30 mg, 30 mg, Oral, Daily, Mearl Latin, PA-C, 30 mg at 06/18/12 1013;  levothyroxine (SYNTHROID, LEVOTHROID) tablet 50 mcg, 50 mcg, Oral, Q breakfast, Mearl Latin, PA-C, 50 mcg at 06/19/12 5409;  metoCLOPramide (REGLAN) injection 5-10 mg, 5-10 mg, Intravenous, Q8H PRN, Mearl Latin, PA-C;  metoCLOPramide (REGLAN) tablet 5-10 mg, 5-10 mg, Oral, Q8H PRN, Mearl Latin, PA-C morphine 2 MG/ML injection 0.3 mg, 0.3 mg, Intravenous, Q3H PRN, Mearl Latin, PA-C, 0.3 mg at 06/17/12 1407;  multivitamin (RENA-VIT) tablet 1 tablet, 1 tablet, Oral, QHS, Sheffield Slider, PA-C, 1 tablet at 06/18/12 1951;  ondansetron [facility]) injection 4 mg, 4 mg, Intravenous, Q6H PRN, Mearl Latin, PA-C;  ondansetron [facility]) injection 4 mg, 4 mg, Intravenous, Q6H PRN, Mearl Latin, PA-C, 4 mg at 06/16/12 1554 ondansetron [facility]) tablet 4 mg, 4 mg, Oral, Q6H PRN, Mearl Latin, PA-C;  oxyCODONE (Oxy IR/ROXICODONE) immediate release tablet 5 mg, 5 mg, Oral, Q4H PRN, Mearl Latin, PA-C, 5 mg at 06/19/12 1040;  pantoprazole (PROTONIX) EC tablet 40 mg, 40 mg, Oral, Q1200, Mearl Latin, PA-C, 40 mg at 06/18/12 1225;  senna-docusate (Senokot-S) tablet 1 tablet, 1 tablet, Oral, QHS PRN, Mearl Latin, PA-C simvastatin (ZOCOR) tablet 10 mg, 10 mg, Oral, QHS, Mearl Latin, PA-C, 10 mg at 06/18/12 1950;  Vitamin D  (Ergocalciferol) (DRISDOL) capsule 50,000 Units, 50,000 Units, Oral, Q Thu, Kimberly Ballard [place], [place]  Patients Current Diet: Renal  Precautions / Restrictions Precautions Precautions: Fall Other Brace/Splint: Cast splint on Right lower leg and foot.  Restrictions Weight Bearing Restrictions: Yes RLE Weight Bearing: Non weight bearing   Prior Activity Level  limited by continued pain of nonunion tibial fx Home Assistive Devices / Equipment Home Assistive Devices/Equipment: Environmental consultant (specify type);CBG Meter Home Adaptive Equipment: Walker - rolling;Bedside commode/3-in-1  Prior Functional Level Prior Function Level of Independence: Needs assistance Needs Assistance: Meal Prep;Light Housekeeping;Dressing  Current Functional Level Cognition  Arousal/Alertness: Awake/alert Overall Cognitive Status: Appears within functional limits for tasks assessed/performed Orientation Level: Oriented X4    Extremity Assessment (includes Sensation/Coordination)  RUE ROM/Strength/Tone: Within functional levels RUE Coordination: WFL - gross/fine motor  RLE ROM/Strength/Tone:  Unable to fully assess;Deficits RLE ROM/Strength/Tone Deficits: Hip and knee ROM within functional limits.  Unable to assess foot and ankle.     ADLs  Eating/Feeding: Independent Where Assessed - Eating/Feeding: Chair Grooming: Wash/dry hands;Wash/dry face;Teeth care;Set up Where Assessed - Grooming: Supported sitting Upper Body Bathing: Set up Where Assessed - Upper Body Bathing: Supported sitting Lower Body Bathing: Moderate assistance Where Assessed - Lower Body Bathing: Supported sit to stand Upper Body Dressing: Set up Where Assessed - Upper Body Dressing: Supported sitting;Unsupported sitting Lower Body Dressing: +1 Total assistance Where Assessed - Lower Body Dressing: Supported sit to stand Toilet Transfer: Minimal assistance Toilet Transfer Method: Surveyor, minerals: Other (comment)  (simulated to recliner) Toileting - Clothing Manipulation and Hygiene: +1 Total assistance Where Assessed - Toileting Clothing Manipulation and Hygiene: Standing Equipment Used: Rolling walker Transfers/Ambulation Related to ADLs: stand pivot transfer with min A1 ADL Comments: Pt limited by pain.      Mobility  Bed Mobility: Supine to Sit;Sitting - Scoot to Edge of Bed Supine to Sit: 4: Min guard;HOB flat;With rails Sitting - Scoot to Edge of Bed: 4: Min assist;With rail Sit to Supine: 4: Min assist    Transfers  Transfers: Sit to Stand;Stand to Sit Sit to Stand: 4: Min assist;With upper extremity assist;With armrests;From chair/3-in-1 Sit to Stand: Patient Percentage: 60% Stand to Sit: 4: Min assist;To chair/3-in-1;With upper extremity assist;With armrests    Ambulation / Gait / Stairs / Wheelchair Mobility  Ambulation/Gait Ambulation/Gait Assistance: 3: Mod assist Ambulation Distance (Feet): 12 Feet Assistive device: Rolling walker Ambulation/Gait Assistance Details: A for balance and stability as patient hops. Cues for RW positioning Gait Pattern: Step-to pattern Stairs: No    Posture / Balance      Special needs/care consideration BiPAP/CPAP no CPM no Continuous Drip IV no Dialysis  yes        Days  MWF after 2pm Life Vest no Oxygen no Special Bed no Trach Size no Wound Vac (area) no       Skin  dry                             Bowel mgmt: LBM 06/16/12 Bladder mgmt: oliguric Diabetic mgmt yes     Previous Home Environment Lives With: Daughter Available Help at Discharge: Family will provide 24/7 as needed per daughter Type of Home: House Home Layout: One level Home Access: Stairs to enter Entrance Stairs-Rails: Left Entrance Stairs-Number of Steps: 3-daughter says will put up temporary ramp Bathroom Shower/Tub: Tub/shower unit;Curtain Bathroom Toilet: Standard  Discharge Living Setting Plans for Discharge Living Setting: Other (Comment) (daughter's  house) Type of Home at Discharge: House Discharge Home Layout: One level Discharge Home Access: Stairs to enter Entrance Stairs-Rails: Left Entrance Stairs-Number of Steps: 3 (plan to put up temporary ramp) Do you have any problems obtaining your medications?: No  Social/Family/Support Systems Patient Roles: Parent Contact Information: [phone] Anticipated Caregiver: daughter & other family Anticipated Caregiver's Contact Information: daughter, Nelida Gores, cell# [phone] w# [phone] Ability/Limitations of Caregiver: S-Min A Caregiver Availability: 24/7 Discharge Plan Discussed with Primary Caregiver: Yes Is Caregiver In Agreement with Plan?: Yes Does Caregiver/Family have Issues with Lodging/Transportation while Pt is in Rehab?: No    Goals/Additional Needs Patient/Family Goal for Rehab: S-light Min Expected length of stay: about 7 days Pt/Family Agrees to Admission and willing to participate: Yes Program Orientation Provided & Reviewed with Pt/Caregiver Including Roles  & Responsibilities: Yes  Decrease burden of Care through IP rehab admission: Patient/family education   Possible need for SNF placement upon discharge: no   Patient Condition: This patient's condition remains as documented in the consult dated 06/18/12, in which the Rehabilitation Physician determined and documented that the patient's condition is appropriate for intensive rehabilitative care in an inpatient rehabilitation facility. Will admit to inpatient rehab today.  Preadmission Screen Completed By:  Brock Ra, 06/19/2012 12:19 PM ______________________________________________________________________   Discussed status with Dr. Riley Kill on 06/19/12 at 12:22pm and received telephone approval for admission today.  Admission Coordinator:  Brock Ra, time 12:23pm/Date 06/19/12

## 2012-06-19 NOTE — Discharge Summary (Signed)
Orthopaedic Trauma Service (OTS)  Patient ID: [name] [name] [name] MRN: [id number] DOB/AGE: January 04, 1929 79 y.o.  Admit date: 06/16/2012 Discharge date: 06/19/2012  Admission Diagnoses: Right Tibia nonunon  ESRD, dialysis HTN DM 2 Hyperlipidemia Hypothyroid Metabolic bone disease Secondary hyperparathyroidism  Discharge Diagnoses:  Principal Problem:   Nonunion of fracture, R tibia Active Problems:   ESRD (end stage renal disease)   HTN (hypertension)   DM2 (diabetes mellitus, type 2)   Hyperlipidemia   Hypothyroid   Metabolic bone disease   Secondary hyperparathyroidism of renal origin   Procedures Performed: 06/16/2012- Dr. Carola Frost  Repair of right tibia nonunion with open reduction and  internal fixation and Infuse allografting   Discharged Condition: stable  Hospital Course:   Patient is a very pleasant 79 year old black female with a that sustained a ground-level fall approximately one year ago resulting in a right tibia fracture. She was treated nonoperatively with prolonged casting and bracing. She developed nonunion of her tibia and has been dealing with chronic pain and instability of her fracture since. Patient presented to our office for consultation and it was determined that patient would benefit from surgical intervention for her nonunion. She did sustain a proximal fibula fracture at the time of her tibia fracture which healed without complication. Thus it appeared that she did have the biologic ability to heal and the biomechanics were the issue. After a thorough discussion of the risks and benefits of surgery given her medical comorbidities patient and her daughter did decide to proceed with surgical intervention. Prior to surgery we did communicate with the renal service given her three-day week dialysis. She is on a Monday, Wednesday, Friday schedule. She went for dialysis Monday of this week and is scheduled for surgery on Tuesday, 06/16/2012. Patient underwent the  procedure described up above and tolerated this. Well. After surgery she was transferred to the PACU for recovery from anesthesia. A renal was consult and that way they can set her up for dialysis on Wednesday. After the PACU patient was transferred to the orthopedic floor for continued observation and pain control. The patient did not have any acute perioperative issues other than her chronic medical issues. She tolerated dialysis well on Wednesday. The pain was well-controlled on postoperative day #1 as well. She was also started on renally adjusted Lovenox for DVT and PE prophylaxis. Patient worked well with physical therapy in the evening of postoperative day #1. Patient was screened and evaluated by the inpatient rehabilitation coordinator was felt to be appropriate. We did obtain a inpatient rehabilitation consult and she was deemed to be an appropriate candidate. On postoperative day #3 patient went to dialysis once again and after dialysis was deemed to be stable for transfer to inpatient rehabilitation once bed became available. The patient was discharged to inpatient rehabilitation in a stable condition  Consults: nephrology- Dr. Briant Cedar  Significant Diagnostic Studies: labs:   [name], [name] [name] (MRN [id number]) as of 06/19/2012 10:13  Ref. Range 06/19/2012 08:10  WBC Latest Range: 4.0-10.5 K/uL 12.5 (H)  RBC Latest Range: 3.87-5.11 MIL/uL 3.18 (L)  Hemoglobin Latest Range: 12.0-15.0 g/dL 78.2 (L)  HCT Latest Range: 36.0-46.0 % 31.2 (L)  MCV Latest Range: 78.0-100.0 fL 98.1  MCH Latest Range: 26.0-34.0 pg 31.4  MCHC Latest Range: 30.0-36.0 g/dL 95.6  RDW Latest Range: 11.5-15.5 % 15.2  Platelets Latest Range: 150-400 K/uL 287  Neutrophils Relative Latest Range: 43-77 % 77  Lymphocytes Relative Latest Range: 12-46 % 7 (L)  Monocytes Relative Latest Range: 3-12 % 12  Eosinophils Relative Latest Range: 0-5 % 4  Basophils Relative Latest Range: 0-1 % 0  NEUT# Latest Range: 1.7-7.7  K/uL 9.7 (H)  Lymphocytes Absolute Latest Range: 0.7-4.0 K/uL 0.8  Monocytes Absolute Latest Range: 0.1-1.0 K/uL 1.5 (H)  Eosinophils Absolute Latest Range: 0.0-0.7 K/uL 0.5  Basophils Absolute Latest Range: 0.0-0.1 K/uL 0.0   CBG (last 3)   Recent Labs  06/18/12 1841 06/18/12 2237 06/19/12 0558  GLUCAP 144* 137* 132*      Treatments: IV hydration, antibiotics: Ancef, analgesia: Morphine and oxy IR, anticoagulation: LMW heparin, insulin: novolog and novolog 70/30, therapies: PT, OT and RN, dialysis: Hemodialysis and surgery: as above   Discharge Exam:  Orthopaedic Trauma Service Progress Note      3 Days Post-Op  Subjective   Pt seen in HD, watching TV and in great spirits Notes some soreness in R leg but doing well overall No new issues  Objective  BP 193/86  Pulse 84  Temp(Src) 98.6 F (37 C) (Oral)  Resp 16  Ht 5\' 2"  (1.575 m)  Wt 75.9 kg (167 lb 5.3 oz)  BMI 30.6 kg/m2  SpO2 91%  Patient Vitals for the past 24 hrs:   BP  Temp  Temp src  Pulse  Resp  SpO2  Weight   06/19/12 1030  193/86 mmHg  -  -  84  -  -  -   06/19/12 1000  167/64 mmHg  -  -  81  -  -  -   06/19/12 0930  160/67 mmHg  -  -  79  -  -  -   06/19/12 0900  172/62 mmHg  -  -  69  16  -  -   06/19/12 0830  180/67 mmHg  -  -  73  16  -  -   06/19/12 0816  181/83 mmHg  98.6 F (37 C)  Oral  85  16  -  -   06/19/12 0757  206/83 mmHg  98.7 F (37.1 C)  Oral  89  18  91 %  75.9 kg (167 lb 5.3 oz)   06/19/12 0553  184/49 mmHg  98.4 F (36.9 C)  Oral  76  18  94 %  -   06/18/12 2004  185/62 mmHg  98.7 F (37.1 C)  Oral  86  18  99 %  -   06/18/12 1426  186/55 mmHg  98.2 F (36.8 C)  -  72  18  100 %  -     Intake/Output     04/03 0701 - 04/04 0700 04/04 0701 - 04/05 0700    P.O. 360     Total Intake(mL/kg) 360 (4.8)     Urine (mL/kg/hr) 1 (0)     Other      Total Output 1      Net +359              Labs  [name], [name] [name] (MRN [id number]) as of 06/19/2012 10:49   Ref. Range   06/19/2012 08:10   WBC  Latest Range: 4.0-10.5 K/uL  12.5 (H)   RBC  Latest Range: 3.87-5.11 MIL/uL  3.18 (L)   Hemoglobin  Latest Range: 12.0-15.0 g/dL  82.9 (L)   HCT  Latest Range: 36.0-46.0 %  31.2 (L)   MCV  Latest Range: 78.0-100.0 fL  98.1   MCH  Latest Range: 26.0-34.0 pg  31.4  MCHC  Latest Range: 30.0-36.0 g/dL  16.1   RDW  Latest Range: 11.5-15.5 %  15.2   Platelets  Latest Range: 150-400 K/uL  287   Neutrophils Relative  Latest Range: 43-77 %  77   Lymphocytes Relative  Latest Range: 12-46 %  7 (L)   Monocytes Relative  Latest Range: 3-12 %  12   Eosinophils Relative  Latest Range: 0-5 %  4   Basophils Relative  Latest Range: 0-1 %  0   NEUT#  Latest Range: 1.7-7.7 K/uL  9.7 (H)   Lymphocytes Absolute  Latest Range: 0.7-4.0 K/uL  0.8   Monocytes Absolute  Latest Range: 0.1-1.0 K/uL  1.5 (H)   Eosinophils Absolute  Latest Range: 0.0-0.7 K/uL  0.5   Basophils Absolute  Latest Range: 0.0-0.1 K/uL  0.0    CBG (last 3)    Recent Labs   06/18/12 1841  06/18/12 2237  06/19/12 0558   GLUCAP  144*  137*  132*       Exam  Gen: appears comfortable, NAD Lungs: clear Cardiac: s1 and s2 Abd: + BS, NT Ext:      Right Lower Extremity               Splint C/D/I             Distal motor and sensory functions             Ext cool             + DP pulse             Swelling controlled   Assessment and Plan  3 Days Post-Op  79 y/o s/p repair of R tibial shaft nonunion POD 3  1. R tibial shaft nonunion POD 3             NWB x 2 weeks, than transition to PWB in boot, WBAT around 6-8 weeks             ROM as tolerated toes and knee             PT/OT consults             Ice and elevate             CIR candidate  2. ESRD             Per renal             HD now 3. HTN             Continue home meds             BP's appear to be stable when taken from L arm  4. DM             SSI             novolog 70/30  5. DVT/PE prophylaxis             Lovenox while  inpatient             Will likely d/c with 7 days of lovenox, 10 days total 6. FEN             Renal diet             KVO IVF 7. Metabolic bone disease             Continue with supplementation  8. Dispo            PT/OT              hopeful to transfer to CIR today    Disposition: CIR      Discharge Orders   Future Orders Complete By Expires     Care order/instruction  As directed     Comments:      Orthopaedic Trauma Service Discharge Instructions   General Discharge Instructions  WEIGHT BEARING STATUS: Nonweightbearing Right leg  RANGE OF MOTION/ACTIVITY: activity as tolerated while maintaining weightbearing restrictions   Diet: as you were eating previously.  Can use over the counter stool softeners and bowel preparations, such as Miralax, to help with bowel movements.  Narcotics can be constipating.  Be sure to drink plenty of fluids  STOP SMOKING OR USING NICOTINE PRODUCTS!!!!  As discussed nicotine severely impairs your body's ability to heal surgical and traumatic wounds but also impairs bone healing.  Wounds and bone heal by forming microscopic blood vessels (angiogenesis) and nicotine is a vasoconstrictor (essentially, shrinks blood vessels).  Therefore, if vasoconstriction occurs to these microscopic blood vessels they essentially disappear and are unable to deliver necessary nutrients to the healing tissue.  This is one modifiable factor that you can do to dramatically increase your chances of healing your injury.    (This means no smoking, no nicotine gum, patches, etc)  DO NOT USE NONSTEROIDAL ANTI-INFLAMMATORY DRUGS (NSAID'S)  Using products such as Advil (ibuprofen), Aleve (naproxen), Motrin (ibuprofen) for additional pain control during fracture healing can delay and/or prevent the healing response.  If you would like to take over the counter (OTC) medication, Tylenol (acetaminophen) is ok.  However, some narcotic medications that are given for  pain control contain acetaminophen as well. Therefore, you should not exceed more than 4000 mg of tylenol in a day if you do not have liver disease.  Also note that there are may OTC medicines, such as cold medicines and allergy medicines that my contain tylenol as well.  If you have any questions about medications and/or interactions please ask your doctor/PA or your pharmacist.   PAIN MEDICATION USE AND EXPECTATIONS  You have likely been given narcotic medications to help control your pain.  After a traumatic event that results in an fracture (broken bone) with or without surgery, it is ok to use narcotic pain medications to help control one's pain.  We understand that everyone responds to pain differently and each individual patient will be evaluated on a regular basis for the continued need for narcotic medications. Ideally, narcotic medication use should last no more than 6-8 weeks (coinciding with fracture healing).   As a patient it is your responsibility as well to monitor narcotic medication use and report the amount and frequency you use these medications when you come to your office visit.   We would also advise that if you are using narcotic medications, you should take a dose prior to therapy to maximize you participation.  IF YOU ARE ON NARCOTIC MEDICATIONS IT IS NOT PERMISSIBLE TO OPERATE A MOTOR VEHICLE (MOTORCYCLE/CAR/TRUCK/MOPED) OR HEAVY MACHINERY DO NOT MIX NARCOTICS WITH OTHER CNS (CENTRAL NERVOUS SYSTEM) DEPRESSANTS SUCH AS ALCOHOL       ICE AND ELEVATE INJURED/OPERATIVE EXTREMITY  Using ice and elevating the injured extremity above your heart can help with swelling and pain control.  Icing in a pulsatile fashion, such as 20 minutes on and 20 minutes off, can be followed.    Do  not place ice directly on skin. Make sure there is a barrier between to skin and the ice pack.    Using frozen items such as frozen peas works well as the conform nicely to the are that needs to be  iced.  USE AN ACE WRAP OR TED HOSE FOR SWELLING CONTROL  In addition to icing and elevation, Ace wraps or TED hose are used to help limit and resolve swelling.  It is recommended to use Ace wraps or TED hose until you are informed to stop.    When using Ace Wraps start the wrapping distally (farthest away from the body) and wrap proximally (closer to the body)   Example: If you had surgery on your leg or thing and you do not have a splint on, start the ace wrap at the toes and work your way up to the thigh        If you had surgery on your upper extremity and do not have a splint on, start the ace wrap at your fingers and work your way up to the upper arm  IF YOU ARE IN A SPLINT OR CAST DO NOT REMOVE IT FOR ANY REASON   If your splint gets wet for any reason please contact the office immediately. You may shower in your splint or cast as long as you keep it dry.  This can be done by wrapping in a cast cover or garbage back (or similar)  Do Not stick any thing down your splint or cast such as pencils, money, or hangers to try and scratch yourself with.  If you feel itchy take benadryl as prescribed on the bottle for itching  IF YOU ARE IN A CAM BOOT (BLACK BOOT)  You may remove boot periodically. Perform daily dressing changes as noted below.  Wash the liner of the boot regularly and wear a sock when wearing the boot. It is recommended that you sleep in the boot until told otherwise  CALL THE OFFICE WITH ANY QUESTIONS OR CONCERTS: [phone]        Medication List    TAKE these medications       acetaminophen 650 MG CR tablet  Commonly known as:  TYLENOL  Take 650 mg by mouth every 8 (eight) hours as needed. arthritis     amLODipine 10 MG tablet  Commonly known as:  NORVASC  Take 10 mg by mouth daily.     aspirin EC 81 MG tablet  Take 81 mg by mouth daily.     calcium acetate 667 MG capsule  Commonly known as:  PHOSLO  Take 667 mg by mouth 3 (three) times daily with meals.      cloNIDine 0.2 MG tablet  Commonly known as:  CATAPRES  Take 0.2 mg by mouth 2 (two) times daily.     ergocalciferol 50000 UNITS capsule  Commonly known as:  VITAMIN D2  Take 50,000 Units by mouth every Thursday. Usually on Thursday     HUMALOG MIX 75/25 KWIKPEN (75-25) 100 UNIT/ML Susp  Generic drug:  insulin lispro protamine-lispro  Inject 5-10 Units into the skin 2 (two) times daily. 5 units AM and 10 units PM     isosorbide mononitrate 30 MG 24 hr tablet  Commonly known as:  IMDUR  Take 30 mg by mouth daily.     levothyroxine 50 MCG tablet  Commonly known as:  SYNTHROID, LEVOTHROID  Take 50 mcg by mouth daily.     pantoprazole 40 MG tablet  Commonly known as:  PROTONIX  Take 1 tablet (40 mg total) by mouth daily at 12 noon.     simvastatin 10 MG tablet  Commonly known as:  ZOCOR  Take 10 mg by mouth at bedtime.     vitamin E 400 UNIT capsule  Take 400 Units by mouth daily.     VOL-CARE RX 1 MG Tabs  Take by mouth at bedtime.       Follow-up Information   Follow up with HANDY,MICHAEL H, MD. Schedule an appointment as soon as possible for a visit in 5 days. (call for appointment)    Contact information:   [address] [place] [address] [phone]       Discharge Instructions and Plan:  Pt discharged in stable condition to CIR She will be NWB on R leg for at least 2 weeks with possible progression to [facility] in CAM boot after removal of splint.  WBAT around the 6-8 week mark Pt will continue with her dialysis as scheduled Given her medical comorbidities pt does remain at risk for persistent complications including but not limited to infection and nonunion   Continue lovenox while in inpatient rehab. Can stop lovenox on POD 10, pt currently on day 3/10  Contact office with questions or concerns [phone]  Signed:  Mearl Latin, PA-C [facility] [phone] (P) 06/19/2012, 10:05 AM

## 2012-06-19 NOTE — Progress Notes (Signed)
Overall Plan of Care [facility]) Patient Details Name: [name] MRN: [id number] DOB: [birth year]  Diagnosis:  R tibial fracture non union s/p ORIF  Co-morbidities: CHF (congestive heart failure)  .  DM2 (diabetes mellitus, type 2)  .  ESRD (end stage renal disease)  HD TTS - [facility]  .  HTN (hypertension)  .  Hyperlipidemia  .  Tibia/fibula fracture    Functional Problem List  Patient demonstrates impairments in the following areas: Balance, Edema, Endurance, Medication Management, Pain and Skin Integrity  Basic ADL's: grooming, bathing, dressing, toileting and toilet and tub transfers Advanced ADL's: simple meal preparation  Transfers:  bed mobility, bed to chair, toilet, tub/shower, car and furniture Locomotion:  wheelchair mobility, ambulation  Additional Impairments:  None  Anticipated Outcomes Item Anticipated Outcome  Eating/Swallowing    Basic self-care  MOD I  Tolieting  MOD I  Bowel/Bladder  Will remain continent of B/B.  Transfers  Modified independent  Locomotion  Supervision  Communication    Cognition    Pain  Less than or equal to three.  Safety/Judgment    Other     Therapy Plan: PT Intensity: Minimum of 1-2 x/day ,45 to 90 minutes PT Frequency: 5 out of 7 days PT Duration Estimated Length of Stay: 7-10 days OT Intensity: Minimum of 1-2 x/day, 45 to 90 minutes OT Frequency: 5 out of 7 days OT Duration/Estimated Length of Stay: 5-7 days      Team Interventions: Item RN PT OT SLP SW TR Other  Self Care/Advanced ADL Retraining   x      Neuromuscular Re-Education  x       Therapeutic Activities  x x      UE/LE Strength Training/ROM  x x      UE/LE Coordination Activities         Visual/Perceptual Remediation/Compensation         DME/Adaptive Equipment Instruction  x x      Therapeutic Exercise  x x      Balance/Vestibular Training  x x      Patient/Family Education x x x      Cognitive Remediation/Compensation          Functional Mobility Training  x x      Ambulation/Gait Training  x       Museum/gallery curator  x       Wheelchair Propulsion/Positioning  x x      Functional Musician  x       Dysphagia/Aspiration Film/video editor         Bladder Management x        Bowel Management x        Disease Management/Prevention x  x      Pain Management x x x      Medication Management x        Skin Care/Wound Management x  x      Splinting/Orthotics         Discharge Planning  x       Psychosocial Support x x                              Team Discharge Planning: Destination: PT-Home ,OT-HOME   , SLP-  Projected Follow-up: PT-Home health PT, OT-HOME HEALTH   , SLP-  Projected Equipment Needs: PT-Rolling  walker with 5" wheels;Wheelchair (measurements);Wheelchair cushion (measurements), OT-  , SLP-  Patient/family involved in discharge planning: PT- Patient,  OT-Patient, SLP-   MD ELOS: 7-10 d Medical Rehab Prognosis:  Excellent Assessment: 79 yo pt with ESRD has had non union of R tibial fracture which has limited mobility for about one year.  Underwent ORIF, now requiring 24/7 rehab RN/MD, CIR level PT/OT.  Treatment team to focus on RLE strengthening, safety during ADL and mobility with goal of D/C to home at mod I /Int sup level.    See Team Conference Notes for weekly updates to the plan of care

## 2012-06-19 NOTE — Progress Notes (Signed)
Plan to admit pt to CIR today. [phone]  

## 2012-06-19 NOTE — H&P (Signed)
Physical Medicine and Rehabilitation Admission H&P    No chief complaint on file. : HPI: [name] is a 79 y.o. right-handed female with history of end-stage renal disease with hemodialysis, diastolic congestive heart failure, diabetes mellitus with peripheral neuropathy. Admitted 06/15/2012 with history of right distal 1/3 tibial shaft fracture in April 2013 treated nonoperatively with numerous modalities including splinting, cast and a CAM boot with bone stimulator. Patient with continued pain at fracture site. Underwent repair of right tibia nonunion with open reduction internal fixation and infuse allografting 06/16/2012 per Dr. Handy. Nonweightbearing right lower extremity x6 weeks. Subcutaneous Lovenox for DVT prophylaxis. Postoperative pain management as directed. Chronic anemia 9.9 and monitored with blood draws per dialysis. Hemodialysis ongoing per renal services. Physical occupational therapy evaluations completed an ongoing with recommendations of physical medicine rehabilitation consult to consider inpatient rehabilitation services. Patient was felt to be a good candidate for inpatient rehabilitation services and was admitted for comprehensive rehabilitation program  Review of Systems  Respiratory: Positive for cough.  Gastrointestinal: Positive for constipation.  Musculoskeletal: Positive for myalgias and joint pain.  All other systems reviewed and are negative   Past Medical History  Diagnosis Date  . CHF (congestive heart failure)   . DM2 (diabetes mellitus, type 2)   . ESRD (end stage renal disease)     HD TTS - [facility]  . HTN (hypertension)   . Hyperlipidemia   . Tibia/fibula fracture 07/10/2011    right   . Hypothyroid   . Cancer     Breast  . Arthritis   . History of blood transfusion    Past Surgical History  Procedure Laterality Date  . Mastectomy    . Abdominal surgery    . Thyroid surgery    . Breast surgery Left     Mastectomy  . Eye surgery  Bilateral     Cataract  . Ankle fusion Right   . Arteriovenous graft placement Right   . Orif tibia fracture Right 06/16/2012    Procedure: OPEN REDUCTION INTERNAL FIXATION (ORIF) TIBIA FRACTURE;  Surgeon: Michael H Handy, MD;  Location: [facility] OR;  Service: Orthopedics;  Laterality: Right;   Family History  Problem Relation Age of Onset  . Hyperlipidemia Father   . Diabetes Father   . Breast cancer Sister    Social History:  reports that she has never smoked. Her smokeless tobacco use includes Snuff. She reports that she does not drink alcohol or use illicit drugs. Allergies:  Allergies  Allergen Reactions  . Codeine Swelling   Medications Prior to Admission  Medication Sig Dispense Refill  . acetaminophen (TYLENOL) 650 MG CR tablet Take 650 mg by mouth every 8 (eight) hours as needed. arthritis      . amLODipine (NORVASC) 10 MG tablet Take 10 mg by mouth daily.      . aspirin EC 81 MG tablet Take 81 mg by mouth daily.      . B Complex-C-Folic Acid (VOL-CARE RX) 1 MG TABS Take by mouth at bedtime.      . calcium acetate (PHOSLO) 667 MG capsule Take 667 mg by mouth 3 (three) times daily with meals.      . cloNIDine (CATAPRES) 0.2 MG tablet Take 0.2 mg by mouth 2 (two) times daily.      . ergocalciferol (VITAMIN D2) 50000 UNITS capsule Take 50,000 Units by mouth every Thursday. Usually on Thursday      . insulin lispro protamine-insulin lispro (HUMALOG MIX 75/25 KWIKPEN) (75-25) 100 UNIT/ML   SUSP Inject 5-10 Units into the skin 2 (two) times daily. 5 units AM and 10 units PM  10 mL  12  . isosorbide mononitrate (IMDUR) 30 MG 24 hr tablet Take 30 mg by mouth daily.      . levothyroxine (SYNTHROID, LEVOTHROID) 50 MCG tablet Take 50 mcg by mouth daily.      . pantoprazole (PROTONIX) 40 MG tablet Take 1 tablet (40 mg total) by mouth daily at 12 noon.  30 tablet  0  . simvastatin (ZOCOR) 10 MG tablet Take 10 mg by mouth at bedtime.      . vitamin E 400 UNIT capsule Take 400 Units by mouth daily.         Home: Home Living Lives With: Daughter Available Help at Discharge: Family;Available PRN/intermittently;Personal care attendant (care attendant 8 hours per day) Type of Home: House Home Access: Stairs to enter Entrance Stairs-Number of Steps: 6 Entrance Stairs-Rails: Left Home Layout: One level Bathroom Shower/Tub: Tub/shower unit;Curtain Bathroom Toilet: Standard Home Adaptive Equipment: Walker - rolling;Bedside commode/3-in-1   Functional History:    Functional Status:  Mobility: Bed Mobility Bed Mobility: Supine to Sit;Sitting - Scoot to Edge of Bed Supine to Sit: 4: Min guard;HOB flat;With rails Sitting - Scoot to Edge of Bed: 4: Min assist;With rail Sit to Supine: 4: Min assist Transfers Transfers: Sit to Stand;Stand to Sit Sit to Stand: 4: Min assist;With upper extremity assist;With armrests;From chair/3-in-1 Sit to Stand: Patient Percentage: 60% Stand to Sit: 4: Min assist;To chair/3-in-1;With upper extremity assist;With armrests Ambulation/Gait Ambulation/Gait Assistance: 3: Mod assist Ambulation Distance (Feet): 12 Feet Assistive device: Rolling walker Ambulation/Gait Assistance Details: A for balance and stability as patient hops. Cues for RW positioning Gait Pattern: Step-to pattern Stairs: No    ADL: ADL Eating/Feeding: Independent Where Assessed - Eating/Feeding: Chair Grooming: Wash/dry hands;Wash/dry face;Teeth care;Set up Where Assessed - Grooming: Supported sitting Upper Body Bathing: Set up Where Assessed - Upper Body Bathing: Supported sitting Lower Body Bathing: Moderate assistance Where Assessed - Lower Body Bathing: Supported sit to stand Upper Body Dressing: Set up Where Assessed - Upper Body Dressing: Supported sitting;Unsupported sitting Lower Body Dressing: +1 Total assistance Where Assessed - Lower Body Dressing: Supported sit to stand Toilet Transfer: Minimal assistance Toilet Transfer Method: Stand pivot Toilet Transfer  Equipment: Other (comment) (simulated to recliner) Equipment Used: Rolling walker Transfers/Ambulation Related to ADLs: stand pivot transfer with min A1 ADL Comments: Pt limited by pain.    Cognition: Cognition Arousal/Alertness: Awake/alert Orientation Level: Oriented X4 Cognition Overall Cognitive Status: Appears within functional limits for tasks assessed/performed Arousal/Alertness: Awake/alert Orientation Level: Appears intact for tasks assessed Behavior During Session: WFL for tasks performed  Physical Exam: Blood pressure 184/49, pulse 76, temperature 98.4 F (36.9 C), temperature source Oral, resp. rate 18, height 5' 2" (1.575 m), weight 74.3 kg (163 lb 12.8 oz), SpO2 94.00%. Physical Exam  Vitals reviewed.  Constitutional: She is oriented to person, place, and time. She appears well-developed.  HENT: oral mucosa pink and moist.partial dentures.  Head: Normocephalic.  Eyes: EOM are normal.  Neck: Neck supple. No thyromegaly present. No jvd Cardiovascular: Normal rate and regular rhythm. No murmur rub or gallop Pulmonary/Chest: Effort normal and breath sounds normal. No respiratory distress. No wheezes rales or rhonchi Abdominal: Bowel sounds are normal. She exhibits no distension.  Neurological: She is alert and oriented to person, place, and time.  Skin:  Cast RLE. Fitting appropriately, no excessive pressure.  Psychiatric: She has a normal mood and affect.    4+ to 5/5 bilateral deltoid, biceps, triceps, grip, left hip flexor knee extensor ankle dorsiflexor plantar flexor  3/5 right hip flexor 4 minus/5 knee extensor ankle not tested secondary to splint  Sensation intact to light touch in both feet.    Results for orders placed during the hospital encounter of 06/16/12 (from the past 48 hour(s))  GLUCOSE, CAPILLARY     Status: Abnormal   Collection Time    06/17/12 11:15 AM      Result Value Range   Glucose-Capillary 53 (*) 70 - 99 mg/dL   Comment 1 Notify RN     GLUCOSE, CAPILLARY     Status: Abnormal   Collection Time    06/17/12 11:18 AM      Result Value Range   Glucose-Capillary 50 (*) 70 - 99 mg/dL  GLUCOSE, CAPILLARY     Status: None   Collection Time    06/17/12 12:04 PM      Result Value Range   Glucose-Capillary 81  70 - 99 mg/dL  CBC     Status: Abnormal   Collection Time    06/17/12  1:57 PM      Result Value Range   WBC 10.6 (*) 4.0 - 10.5 K/uL   RBC 3.11 (*) 3.87 - 5.11 MIL/uL   Hemoglobin 9.9 (*) 12.0 - 15.0 g/dL   HCT 30.6 (*) 36.0 - 46.0 %   MCV 98.4  78.0 - 100.0 fL   MCH 31.8  26.0 - 34.0 pg   MCHC 32.4  30.0 - 36.0 g/dL   RDW 15.9 (*) 11.5 - 15.5 %   Platelets 305  150 - 400 K/uL  PHOSPHORUS     Status: Abnormal   Collection Time    06/17/12  1:57 PM      Result Value Range   Phosphorus 5.9 (*) 2.3 - 4.6 mg/dL  COMPREHENSIVE METABOLIC PANEL     Status: Abnormal   Collection Time    06/17/12  1:57 PM      Result Value Range   Sodium 139  135 - 145 mEq/L   Potassium 4.9  3.5 - 5.1 mEq/L   Chloride 100  96 - 112 mEq/L   CO2 26  19 - 32 mEq/L   Glucose, Bld 157 (*) 70 - 99 mg/dL   BUN 46 (*) 6 - 23 mg/dL   Creatinine, Ser 8.93 (*) 0.50 - 1.10 mg/dL   Calcium 8.3 (*) 8.4 - 10.5 mg/dL   Total Protein 7.0  6.0 - 8.3 g/dL   Albumin 3.3 (*) 3.5 - 5.2 g/dL   AST 29  0 - 37 U/L   ALT 7  0 - 35 U/L   Alkaline Phosphatase 99  39 - 117 U/L   Total Bilirubin 0.2 (*) 0.3 - 1.2 mg/dL   GFR calc non Af Amer 4 (*) >90 mL/min   GFR calc Af Amer 4 (*) >90 mL/min   Comment:            The eGFR has been calculated     using the CKD EPI equation.     This calculation has not been     validated in all clinical     situations.     eGFR's persistently     <90 mL/min signify     possible Chronic Kidney Disease.  HEMOGLOBIN A1C     Status: Abnormal   Collection Time    06/17/12  1:57 PM      Result Value Range     Hemoglobin A1C 6.1 (*) <5.7 %   Comment: (NOTE)                                                                                According to the ADA Clinical Practice Recommendations for 2011, when     HbA1c is used as a screening test:      >=6.5%   Diagnostic of Diabetes Mellitus               (if abnormal result is confirmed)     5.7-6.4%   Increased risk of developing Diabetes Mellitus     References:Diagnosis and Classification of Diabetes Mellitus,Diabetes     Care,2011,34(Suppl 1):S62-S69 and Standards of Medical Care in             Diabetes - 2011,Diabetes Care,2011,34 (Suppl 1):S11-S61.   Mean Plasma Glucose 128 (*) <117 mg/dL  GLUCOSE, CAPILLARY     Status: Abnormal   Collection Time    06/17/12  2:02 PM      Result Value Range   Glucose-Capillary 144 (*) 70 - 99 mg/dL  GLUCOSE, CAPILLARY     Status: Abnormal   Collection Time    06/17/12  6:49 PM      Result Value Range   Glucose-Capillary 116 (*) 70 - 99 mg/dL  GLUCOSE, CAPILLARY     Status: Abnormal   Collection Time    06/17/12  9:22 PM      Result Value Range   Glucose-Capillary 202 (*) 70 - 99 mg/dL  GLUCOSE, CAPILLARY     Status: Abnormal   Collection Time    06/18/12  6:51 AM      Result Value Range   Glucose-Capillary 139 (*) 70 - 99 mg/dL  GLUCOSE, CAPILLARY     Status: None   Collection Time    06/18/12 10:16 AM      Result Value Range   Glucose-Capillary 81  70 - 99 mg/dL   Comment 1 Notify RN    GLUCOSE, CAPILLARY     Status: Abnormal   Collection Time    06/18/12  4:40 PM      Result Value Range   Glucose-Capillary 159 (*) 70 - 99 mg/dL  GLUCOSE, CAPILLARY     Status: Abnormal   Collection Time    06/18/12  6:41 PM      Result Value Range   Glucose-Capillary 144 (*) 70 - 99 mg/dL   Comment 1 Notify RN    GLUCOSE, CAPILLARY     Status: Abnormal   Collection Time    06/18/12 10:37 PM      Result Value Range   Glucose-Capillary 137 (*) 70 - 99 mg/dL  GLUCOSE, CAPILLARY     Status: Abnormal   Collection Time    06/19/12  5:58 AM      Result Value Range   Glucose-Capillary 132 (*) 70 - 99 mg/dL   No  results found.  Post Admission Physician Evaluation: 1. Functional deficits secondary  to right tibia non-union s/p ORIF. 2. Patient is admitted to receive collaborative, interdisciplinary care between the physiatrist, rehab nursing staff, and therapy team. 3. Patient's level of medical complexity and substantial therapy needs   in context of that medical necessity cannot be provided at a lesser intensity of care such as a SNF. 4. Patient has experienced substantial functional loss from his/her baseline which was documented above under the "Functional History" and "Functional Status" headings.  Judging by the patient's diagnosis, physical exam, and functional history, the patient has potential for functional progress which will result in measurable gains while on inpatient rehab.  These gains will be of substantial and practical use upon discharge  in facilitating mobility and self-care at the household level. 5. Physiatrist will provide 24 hour management of medical needs as well as oversight of the therapy plan/treatment and provide guidance as appropriate regarding the interaction of the two. 6. 24 hour rehab nursing will assist with bladder management, bowel management, safety, skin/wound care, disease management, medication administration, pain management and patient education  and help integrate therapy concepts, techniques,education, etc. 7. PT will assess and treat for/with: Lower extremity strength, range of motion, stamina, balance, functional mobility, safety, adaptive techniques and equipment,ortho precautions, pain mgt, edema control.   Goals are: mod I +/- wheelchair level. 8. OT will assess and treat for/with: ADL's, functional mobility, safety, upper extremity strength, adaptive techniques and equipment, ortho precautions, pain mgt.   Goals are: mod I to supervision. 9. SLP will assess and treat for/with: n/a.  Goals are: n/a. 10. Case Management and Social Worker will assess and treat for  psychological issues and discharge planning. 11. Team conference will be held weekly to assess progress toward goals and to determine barriers to discharge. 12. Patient will receive at least 3 hours of therapy per day at least 5 days per week. 13. ELOS: 7 days      Prognosis:  excellent   Medical Problem List and Plan: 1. Right tibia nonunion fracture status post ORIF 06/16/2012 2. DVT Prophylaxis/Anticoagulation: Subcutaneous Lovenox. Monitor platelet counts any signs of bleeding. Check venous dopplers to r/o dvt given nwb and immobilization of right leg.  3. Pain Management: Oxycodone as needed. Monitor with increased mobility 4. Neuropsych: This patient is capable of making decisions on his/her own behalf. 5. End stage renal disease with hemodialysis. Followup per renal services 6. Chronic anemia. Continue Aranesp as directed. Followup CBC with dialysis 7. Hypertension. Norvasc 10 mg daily, clonidine 0.2 mg twice a day, M.D. or 30 mg daily. Monitor the increased mobility 8. Diabetes mellitus with peripheral neuropathy. Hemoglobin A1c 6.1. Check blood sugars a.c. and at bedtime. Continue sliding scale insulin and NovoLog 70/30 insulin 5 units twice a day 9. Diastolic congestive heart failure. Monitor for any signs of fluid overload. i's and o's, weights. 10. Hypothyroidism. Synthroid 11. Hyperlipidemia. Zocor 12. History left mastectomy secondary to cancer.  Myah Guynes T. Jonnette Nuon, MD, FAAPMR  06/19/2012 

## 2012-06-19 NOTE — Progress Notes (Signed)
Pt arrived to room 4009 from 4No via stretcher; alert and oriented x 4; VSS, ace, wrap C.D.I to RLE , + CMS to toes. C/O right ankle pain 5/10, declined pain med for now. Pain managed reviewed.  Oriented to room and rehab process, safety plan reviewed.

## 2012-06-19 NOTE — Progress Notes (Signed)
[facility] Progress Note  3 Days Post-Op  Subjective   Pt seen in HD, watching TV and in great spirits Notes some soreness in R leg but doing well overall No new issues  Objective  BP 193/86  Pulse 84  Temp(Src) 98.6 F (37 C) (Oral)  Resp 16  Ht 5\' 2"  (1.575 m)  Wt 75.9 kg (167 lb 5.3 oz)  BMI 30.6 kg/m2  SpO2 91%  Patient Vitals for the past 24 hrs:  BP Temp Temp src Pulse Resp SpO2 Weight  06/19/12 1030 193/86 mmHg - - 84 - - -  06/19/12 1000 167/64 mmHg - - 81 - - -  06/19/12 0930 160/67 mmHg - - 79 - - -  06/19/12 0900 172/62 mmHg - - 69 16 - -  06/19/12 0830 180/67 mmHg - - 73 16 - -  06/19/12 0816 181/83 mmHg 98.6 F (37 C) Oral 85 16 - -  06/19/12 0757 206/83 mmHg 98.7 F (37.1 C) Oral 89 18 91 % 75.9 kg (167 lb 5.3 oz)  06/19/12 0553 184/49 mmHg 98.4 F (36.9 C) Oral 76 18 94 % -  06/18/12 2004 185/62 mmHg 98.7 F (37.1 C) Oral 86 18 99 % -  06/18/12 1426 186/55 mmHg 98.2 F (36.8 C) - 72 18 100 % -    Intake/Output     04/03 0701 - 04/04 0700 04/04 0701 - 04/05 0700   P.O. 360    Total Intake(mL/kg) 360 (4.8)    Urine (mL/kg/hr) 1 (0)    Other     Total Output 1     Net +359            Labs  Results for LINN, CLAVIN (MRN [id number]) as of 06/19/2012 10:49  Ref. Range 06/19/2012 08:10  WBC Latest Range: 4.0-10.5 K/uL 12.5 (H)  RBC Latest Range: 3.87-5.11 MIL/uL 3.18 (L)  Hemoglobin Latest Range: 12.0-15.0 g/dL 62.9 (L)  HCT Latest Range: 36.0-46.0 % 31.2 (L)  MCV Latest Range: 78.0-100.0 fL 98.1  MCH Latest Range: 26.0-34.0 pg 31.4  MCHC Latest Range: 30.0-36.0 g/dL 52.8  RDW Latest Range: 11.5-15.5 % 15.2  Platelets Latest Range: 150-400 K/uL 287  Neutrophils Relative Latest Range: 43-77 % 77  Lymphocytes Relative Latest Range: 12-46 % 7 (L)  Monocytes Relative Latest Range: 3-12 % 12  Eosinophils Relative Latest Range: 0-5 % 4  Basophils Relative Latest Range: 0-1 % 0  NEUT# Latest Range: 1.7-7.7 K/uL 9.7 (H)   Lymphocytes Absolute Latest Range: 0.7-4.0 K/uL 0.8  Monocytes Absolute Latest Range: 0.1-1.0 K/uL 1.5 (H)  Eosinophils Absolute Latest Range: 0.0-0.7 K/uL 0.5  Basophils Absolute Latest Range: 0.0-0.1 K/uL 0.0   CBG (last 3)   Recent Labs  06/18/12 1841 06/18/12 2237 06/19/12 0558  GLUCAP 144* 137* 132*      Exam  Gen: appears comfortable, NAD Lungs: clear Cardiac: s1 and s2 Abd: + BS, NT Ext:      Right Lower Extremity               Splint C/D/I             Distal motor and sensory functions             Ext cool             + DP pulse             Swelling controlled   Assessment and Plan  3 Days Post-Op  79 y/o s/p repair of  R tibial shaft nonunion POD 3  1. R tibial shaft nonunion POD 3             NWB x 2 weeks, than transition to PWB in boot, WBAT around 6-8 weeks             ROM as tolerated toes and knee             PT/OT consults             Ice and elevate             CIR candidate  2. ESRD             Per renal             HD now 3. HTN             Continue home meds  BP's appear to be stable when taken from L arm  4. DM             SSI             novolog 70/30  5. DVT/PE prophylaxis             Lovenox while inpatient             Will likely d/c with 7 days of lovenox, 10 days total 6. FEN             Renal diet             KVO IVF 7. Metabolic bone disease             Continue with supplementation               8. Dispo            PT/OT              hopeful to transfer to CIR today   Mearl Latin, PA-C [facility] [phone] (P) 06/19/2012 10:49 AM

## 2012-06-19 NOTE — H&P (View-Only) (Signed)
Physical Medicine and Rehabilitation Admission H&P    No chief complaint on file. : HPI: [name] is a 79 y.o. right-handed female with history of end-stage renal disease with hemodialysis, diastolic congestive heart failure, diabetes mellitus with peripheral neuropathy. Admitted 06/15/2012 with history of right distal 1/3 tibial shaft fracture in April 2013 treated nonoperatively with numerous modalities including splinting, cast and a CAM boot with bone stimulator. Patient with continued pain at fracture site. Underwent repair of right tibia nonunion with open reduction internal fixation and infuse allografting 06/16/2012 per Dr. Carola Frost. Nonweightbearing right lower extremity x6 weeks. Subcutaneous Lovenox for DVT prophylaxis. Postoperative pain management as directed. Chronic anemia 9.9 and monitored with blood draws per dialysis. Hemodialysis ongoing per renal services. Physical occupational therapy evaluations completed an ongoing with recommendations of physical medicine rehabilitation consult to consider inpatient rehabilitation services. Patient was felt to be a good candidate for inpatient rehabilitation services and was admitted for comprehensive rehabilitation program  Review of Systems  Respiratory: Positive for cough.  Gastrointestinal: Positive for constipation.  Musculoskeletal: Positive for myalgias and joint pain.  All other systems reviewed and are negative   Past Medical History  Diagnosis Date  . CHF (congestive heart failure)   . DM2 (diabetes mellitus, type 2)   . ESRD (end stage renal disease)     HD TTS - [facility]  . HTN (hypertension)   . Hyperlipidemia   . Tibia/fibula fracture 07/10/2011    right   . Hypothyroid   . Cancer     Breast  . Arthritis   . History of blood transfusion    Past Surgical History  Procedure Laterality Date  . Mastectomy    . Abdominal surgery    . Thyroid surgery    . Breast surgery Left     Mastectomy  . Eye surgery  Bilateral     Cataract  . Ankle fusion Right   . Arteriovenous graft placement Right   . Orif tibia fracture Right 06/16/2012    Procedure: OPEN REDUCTION INTERNAL FIXATION (ORIF) TIBIA FRACTURE;  Surgeon: Budd Palmer, MD;  Location: [facility] OR;  Service: Orthopedics;  Laterality: Right;   Family History  Problem Relation Age of Onset  . Hyperlipidemia Father   . Diabetes Father   . Breast cancer Sister    Social History:  reports that she has never smoked. Her smokeless tobacco use includes Snuff. She reports that she does not drink alcohol or use illicit drugs. Allergies:  Allergies  Allergen Reactions  . Codeine Swelling   Medications Prior to Admission  Medication Sig Dispense Refill  . acetaminophen (TYLENOL) 650 MG CR tablet Take 650 mg by mouth every 8 (eight) hours as needed. arthritis      . amLODipine (NORVASC) 10 MG tablet Take 10 mg by mouth daily.      Marland Kitchen aspirin EC 81 MG tablet Take 81 mg by mouth daily.      . B Complex-C-Folic Acid (VOL-CARE RX) 1 MG TABS Take by mouth at bedtime.      . calcium acetate (PHOSLO) 667 MG capsule Take 667 mg by mouth 3 (three) times daily with meals.      . cloNIDine (CATAPRES) 0.2 MG tablet Take 0.2 mg by mouth 2 (two) times daily.      . ergocalciferol (VITAMIN D2) 50000 UNITS capsule Take 50,000 Units by mouth every Thursday. Usually on Thursday      . insulin lispro protamine-insulin lispro (HUMALOG MIX 75/25 KWIKPEN) (75-25) 100 UNIT/ML  SUSP Inject 5-10 Units into the skin 2 (two) times daily. 5 units AM and 10 units PM  10 mL  12  . isosorbide mononitrate (IMDUR) 30 MG 24 hr tablet Take 30 mg by mouth daily.      Marland Kitchen levothyroxine (SYNTHROID, LEVOTHROID) 50 MCG tablet Take 50 mcg by mouth daily.      . pantoprazole (PROTONIX) 40 MG tablet Take 1 tablet (40 mg total) by mouth daily at 12 noon.  30 tablet  0  . simvastatin (ZOCOR) 10 MG tablet Take 10 mg by mouth at bedtime.      . vitamin E 400 UNIT capsule Take 400 Units by mouth daily.         Home: Home Living Lives With: Daughter Available Help at Discharge: Family;Available PRN/intermittently;Personal care attendant (care attendant 8 hours per day) Type of Home: House Home Access: Stairs to enter [employer] of Steps: 6 Entrance Stairs-Rails: Left Home Layout: One level Bathroom Shower/Tub: Forensic scientist: Standard Home Adaptive Equipment: Walker - rolling;Bedside commode/3-in-1   Functional History:    Functional Status:  Mobility: Bed Mobility Bed Mobility: Supine to Sit;Sitting - Scoot to Edge of Bed Supine to Sit: 4: Min guard;HOB flat;With rails Sitting - Scoot to Edge of Bed: 4: Min assist;With rail Sit to Supine: 4: Min assist Transfers Transfers: Sit to Stand;Stand to Sit Sit to Stand: 4: Min assist;With upper extremity assist;With armrests;From chair/3-in-1 Sit to Stand: Patient Percentage: 60% Stand to Sit: 4: Min assist;To chair/3-in-1;With upper extremity assist;With armrests Ambulation/Gait Ambulation/Gait Assistance: 3: Mod assist Ambulation Distance (Feet): 12 Feet Assistive device: Rolling walker Ambulation/Gait Assistance Details: A for balance and stability as patient hops. Cues for RW positioning Gait Pattern: Step-to pattern Stairs: No    ADL: ADL Eating/Feeding: Independent Where Assessed - Eating/Feeding: Chair Grooming: Wash/dry hands;Wash/dry face;Teeth care;Set up Where Assessed - Grooming: Supported sitting Upper Body Bathing: Set up Where Assessed - Upper Body Bathing: Supported sitting Lower Body Bathing: Moderate assistance Where Assessed - Lower Body Bathing: Supported sit to stand Upper Body Dressing: Set up Where Assessed - Upper Body Dressing: Supported sitting;Unsupported sitting Lower Body Dressing: +1 Total assistance Where Assessed - Lower Body Dressing: Supported sit to stand Toilet Transfer: Minimal assistance Toilet Transfer Method: Retail banker: Other (comment) (simulated to recliner) Equipment Used: Rolling walker Transfers/Ambulation Related to ADLs: stand pivot transfer with min A1 ADL Comments: Pt limited by pain.    Cognition: Cognition Arousal/Alertness: Awake/alert Orientation Level: Oriented X4 Cognition Overall Cognitive Status: Appears within functional limits for tasks assessed/performed Arousal/Alertness: Awake/alert Orientation Level: Appears intact for tasks assessed Behavior During Session: [facility] for tasks performed  Physical Exam: Blood pressure 184/49, pulse 76, temperature 98.4 F (36.9 C), temperature source Oral, resp. rate 18, height 5\' 2"  (1.575 m), weight 74.3 kg (163 lb 12.8 oz), SpO2 94.00%. Physical Exam  Vitals reviewed.  Constitutional: She is oriented to person, place, and time. She appears well-developed.  HENT: oral mucosa pink and moist.partial dentures.  Head: Normocephalic.  Eyes: EOM are normal.  Neck: Neck supple. No thyromegaly present. No jvd Cardiovascular: Normal rate and regular rhythm. No murmur rub or gallop Pulmonary/Chest: Effort normal and breath sounds normal. No respiratory distress. No wheezes rales or rhonchi Abdominal: Bowel sounds are normal. She exhibits no distension.  Neurological: She is alert and oriented to person, place, and time.  Skin:  Cast RLE. Fitting appropriately, no excessive pressure.  Psychiatric: She has a normal mood and affect.  4+ to 5/5 bilateral deltoid, biceps, triceps, grip, left hip flexor knee extensor ankle dorsiflexor plantar flexor  3/5 right hip flexor 4 minus/5 knee extensor ankle not tested secondary to splint  Sensation intact to light touch in both feet.    Results for orders placed during the hospital encounter of 06/16/12 (from the past 48 hour(s))  GLUCOSE, CAPILLARY     Status: Abnormal   Collection Time    06/17/12 11:15 AM      Result Value Range   Glucose-Capillary 53 (*) 70 - 99 mg/dL   Comment 1 Notify RN     GLUCOSE, CAPILLARY     Status: Abnormal   Collection Time    06/17/12 11:18 AM      Result Value Range   Glucose-Capillary 50 (*) 70 - 99 mg/dL  GLUCOSE, CAPILLARY     Status: None   Collection Time    06/17/12 12:04 PM      Result Value Range   Glucose-Capillary 81  70 - 99 mg/dL  CBC     Status: Abnormal   Collection Time    06/17/12  1:57 PM      Result Value Range   WBC 10.6 (*) 4.0 - 10.5 K/uL   RBC 3.11 (*) 3.87 - 5.11 MIL/uL   Hemoglobin 9.9 (*) 12.0 - 15.0 g/dL   HCT 16.1 (*) 09.6 - 04.5 %   MCV 98.4  78.0 - 100.0 fL   MCH 31.8  26.0 - 34.0 pg   MCHC 32.4  30.0 - 36.0 g/dL   RDW 40.9 (*) 81.1 - 91.4 %   Platelets 305  150 - 400 K/uL  PHOSPHORUS     Status: Abnormal   Collection Time    06/17/12  1:57 PM      Result Value Range   Phosphorus 5.9 (*) 2.3 - 4.6 mg/dL  COMPREHENSIVE METABOLIC PANEL     Status: Abnormal   Collection Time    06/17/12  1:57 PM      Result Value Range   Sodium 139  135 - 145 mEq/L   Potassium 4.9  3.5 - 5.1 mEq/L   Chloride 100  96 - 112 mEq/L   CO2 26  19 - 32 mEq/L   Glucose, Bld 157 (*) 70 - 99 mg/dL   BUN 46 (*) 6 - 23 mg/dL   Creatinine, Ser 7.82 (*) 0.50 - 1.10 mg/dL   Calcium 8.3 (*) 8.4 - 10.5 mg/dL   Total Protein 7.0  6.0 - 8.3 g/dL   Albumin 3.3 (*) 3.5 - 5.2 g/dL   AST 29  0 - 37 U/L   ALT 7  0 - 35 U/L   Alkaline Phosphatase 99  39 - 117 U/L   Total Bilirubin 0.2 (*) 0.3 - 1.2 mg/dL   GFR calc non Af Amer 4 (*) >90 mL/min   GFR calc Af Amer 4 (*) >90 mL/min   Comment:            The eGFR has been calculated     using the CKD EPI equation.     This calculation has not been     validated in all clinical     situations.     eGFR's persistently     <90 mL/min signify     possible Chronic Kidney Disease.  HEMOGLOBIN A1C     Status: Abnormal   Collection Time    06/17/12  1:57 PM      Result Value Range  Hemoglobin A1C 6.1 (*) <5.7 %   Comment: (NOTE)                                                                                According to the ADA Clinical Practice Recommendations for 2011, when     HbA1c is used as a screening test:      >=6.5%   Diagnostic of Diabetes Mellitus               (if abnormal result is confirmed)     5.7-6.4%   Increased risk of developing Diabetes Mellitus     References:Diagnosis and Classification of Diabetes Mellitus,Diabetes     Care,2011,34(Suppl 1):S62-S69 and Standards of Medical Care in             Diabetes - 2011,Diabetes Care,2011,34 (Suppl 1):S11-S61.   Mean Plasma Glucose 128 (*) <117 mg/dL  GLUCOSE, CAPILLARY     Status: Abnormal   Collection Time    06/17/12  2:02 PM      Result Value Range   Glucose-Capillary 144 (*) 70 - 99 mg/dL  GLUCOSE, CAPILLARY     Status: Abnormal   Collection Time    06/17/12  6:49 PM      Result Value Range   Glucose-Capillary 116 (*) 70 - 99 mg/dL  GLUCOSE, CAPILLARY     Status: Abnormal   Collection Time    06/17/12  9:22 PM      Result Value Range   Glucose-Capillary 202 (*) 70 - 99 mg/dL  GLUCOSE, CAPILLARY     Status: Abnormal   Collection Time    06/18/12  6:51 AM      Result Value Range   Glucose-Capillary 139 (*) 70 - 99 mg/dL  GLUCOSE, CAPILLARY     Status: None   Collection Time    06/18/12 10:16 AM      Result Value Range   Glucose-Capillary 81  70 - 99 mg/dL   Comment 1 Notify RN    GLUCOSE, CAPILLARY     Status: Abnormal   Collection Time    06/18/12  4:40 PM      Result Value Range   Glucose-Capillary 159 (*) 70 - 99 mg/dL  GLUCOSE, CAPILLARY     Status: Abnormal   Collection Time    06/18/12  6:41 PM      Result Value Range   Glucose-Capillary 144 (*) 70 - 99 mg/dL   Comment 1 Notify RN    GLUCOSE, CAPILLARY     Status: Abnormal   Collection Time    06/18/12 10:37 PM      Result Value Range   Glucose-Capillary 137 (*) 70 - 99 mg/dL  GLUCOSE, CAPILLARY     Status: Abnormal   Collection Time    06/19/12  5:58 AM      Result Value Range   Glucose-Capillary 132 (*) 70 - 99 mg/dL   No  results found.  Post Admission Physician Evaluation: 1. Functional deficits secondary  to right tibia non-union s/p ORIF. 2. Patient is admitted to receive collaborative, interdisciplinary care between the physiatrist, rehab nursing staff, and therapy team. 3. Patient's level of medical complexity and substantial therapy needs  in context of that medical necessity cannot be provided at a lesser intensity of care such as a SNF. 4. Patient has experienced substantial functional loss from his/her baseline which was documented above under the "Functional History" and "Functional Status" headings.  Judging by the patient's diagnosis, physical exam, and functional history, the patient has potential for functional progress which will result in measurable gains while on inpatient rehab.  These gains will be of substantial and practical use upon discharge  in facilitating mobility and self-care at the household level. 5. Physiatrist will provide 24 hour management of medical needs as well as oversight of the therapy plan/treatment and provide guidance as appropriate regarding the interaction of the two. 6. 24 hour rehab nursing will assist with bladder management, bowel management, safety, skin/wound care, disease management, medication administration, pain management and patient education  and help integrate therapy concepts, techniques,education, etc. 7. PT will assess and treat for/with: Lower extremity strength, range of motion, stamina, balance, functional mobility, safety, adaptive techniques and equipment,ortho precautions, pain mgt, edema control.   Goals are: mod I +/- wheelchair level. 8. OT will assess and treat for/with: ADL's, functional mobility, safety, upper extremity strength, adaptive techniques and equipment, ortho precautions, pain mgt.   Goals are: mod I to supervision. 9. SLP will assess and treat for/with: n/a.  Goals are: n/a. 10. Case Management and Social Worker will assess and treat for  psychological issues and discharge planning. 11. Team conference will be held weekly to assess progress toward goals and to determine barriers to discharge. 12. Patient will receive at least 3 hours of therapy per day at least 5 days per week. 13. ELOS: 7 days      Prognosis:  excellent   Medical Problem List and Plan: 1. Right tibia nonunion fracture status post ORIF 06/16/2012 2. DVT Prophylaxis/Anticoagulation: Subcutaneous Lovenox. Monitor platelet counts any signs of bleeding. Check venous dopplers to r/o dvt given nwb and immobilization of right leg.  3. Pain Management: Oxycodone as needed. Monitor with increased mobility 4. Neuropsych: This patient is capable of making decisions on his/her own behalf. 5. End stage renal disease with hemodialysis. Followup per renal services 6. Chronic anemia. Continue Aranesp as directed. Followup CBC with dialysis 7. Hypertension. Norvasc 10 mg daily, clonidine 0.2 mg twice a day, M.D. or 30 mg daily. Monitor the increased mobility 8. Diabetes mellitus with peripheral neuropathy. Hemoglobin A1c 6.1. Check blood sugars a.c. and at bedtime. Continue sliding scale insulin and NovoLog 70/30 insulin 5 units twice a day 9. Diastolic congestive heart failure. Monitor for any signs of fluid overload. i's and o's, weights. 10. Hypothyroidism. Synthroid 11. Hyperlipidemia. Zocor 12. History left mastectomy secondary to cancer.  Ranelle Oyster, MD, [place] [facility]  06/19/2012

## 2012-06-20 ENCOUNTER — Inpatient Hospital Stay (HOSPITAL_COMMUNITY): Payer: Medicare Other | Admitting: *Deleted

## 2012-06-20 ENCOUNTER — Inpatient Hospital Stay (HOSPITAL_COMMUNITY): Payer: Medicare Other | Admitting: Physical Therapy

## 2012-06-20 DIAGNOSIS — N186 End stage renal disease: Secondary | ICD-10-CM

## 2012-06-20 DIAGNOSIS — S82209A Unspecified fracture of shaft of unspecified tibia, initial encounter for closed fracture: Secondary | ICD-10-CM

## 2012-06-20 DIAGNOSIS — W19XXXA Unspecified fall, initial encounter: Secondary | ICD-10-CM

## 2012-06-20 DIAGNOSIS — E1165 Type 2 diabetes mellitus with hyperglycemia: Secondary | ICD-10-CM

## 2012-06-20 DIAGNOSIS — IMO0001 Reserved for inherently not codable concepts without codable children: Secondary | ICD-10-CM

## 2012-06-20 DIAGNOSIS — M7989 Other specified soft tissue disorders: Secondary | ICD-10-CM

## 2012-06-20 LAB — GLUCOSE, CAPILLARY: Glucose-Capillary: 202 mg/dL — ABNORMAL HIGH (ref 70–99)

## 2012-06-20 NOTE — Progress Notes (Signed)
Occupational Therapy Note  Patient Details  Name: ODYSSEY VASBINDER MRN: [id number] Date of Birth: [birth year] Today's Date: 06/20/2012  Time:  1400-1430  (30 min) Individual session Pain:  7/10 with Right LE movement  Skilled intervention for wc mobility, toilet transfer, standing balance, bed mobility.  Pt. Propelled wc to bathroom with minimal assist.  Needed assist with set up for wc and RLE positioning off leg rest.  Transferred to toilet was mod assist with OT assisting with LOB x2 during dynamic balance activity of donning/doffing pants.  Pt. Verbalized when about to loose her balance and 2 times was able to sit down.    Humberto Seals 06/20/2012, 6:30 PM

## 2012-06-20 NOTE — Evaluation (Signed)
Occupational Therapy Assessment and Plan  Patient Details  Name: [name] MRN: [id number] Date of Birth: [birth year]  OT Diagnosis: apraxia Rehab Potential: Rehab Potential: Excellent ELOS:7-10  days  Today's Date: 06/20/2012 Time:  - 0800-0900  (60 min)    Problem List:  Patient Active Problem List  Diagnosis  . Tibia/fibula fracture  . ESRD (end stage renal disease)  . HTN (hypertension)  . DM2 (diabetes mellitus, type 2)  . Hyperlipidemia  . Anemia  . Hypothyroid  . Nonunion of fracture, R tibia  . Metabolic bone disease  . Secondary hyperparathyroidism of renal origin    Past Medical History:  Past Medical History  Diagnosis Date  . CHF (congestive heart failure)   . DM2 (diabetes mellitus, type 2)   . ESRD (end stage renal disease)     [facility] T[facility] - [facility]  . HTN (hypertension)   . Hyperlipidemia   . Tibia/fibula fracture 07/10/2011    right   . Hypothyroid   . Cancer     Breast  . Arthritis   . History of blood transfusion   . Hyperparathyroidism, secondary renal    Past Surgical History:  Past Surgical History  Procedure Laterality Date  . Mastectomy    . Abdominal surgery    . Thyroid surgery    . Breast surgery Left     Mastectomy  . Eye surgery Bilateral     Cataract  . Ankle fusion Right   . Arteriovenous graft placement Right   . Orif tibia fracture Right 06/16/2012    Procedure: OPEN REDUCTION INTERNAL FIXATION (ORIF) TIBIA FRACTURE;  Surgeon: Budd Palmer, MD;  Location: [facility] OR;  Service: Orthopedics;  Laterality: Right;    Assessment & Plan Clinical Impression: [name] is a 80 y.o. right-handed female with history of end-stage renal disease with hemodialysis, diastolic congestive heart failure, diabetes mellitus with peripheral neuropathy. Admitted 06/15/2012 with history of right distal 1/3 tibial shaft fracture in April 2013 treated nonoperatively with numerous modalities including splinting, cast and a CAM boot with bone  stimulator. Patient with continued pain at fracture site. Underwent repair of right tibia nonunion with open reduction internal fixation and infuse allografting 06/16/2012 per Dr. Carola Frost. Nonweightbearing right lower extremity x6 weeks. Subcutaneous Lovenox for DVT prophylaxis. Postoperative pain management as directed. Chronic anemia 9.9 and monitored with blood draws per dialysis. Hemodialysis ongoing per renal services. Physical occupational therapy evaluations completed an ongoing with recommendations of physical medicine rehabilitation consult to consider inpatient rehabilitation services. Patient was felt to be a good candidate for inpatient rehabilitation services and was admitted for comprehensive rehabilitation progra  Patient transferred to [facility] on 06/19/2012 .    Patient currently requires min with basic self-care skills secondary to muscle weakness.  Prior to hospitalization, patient could complete BADL with modified independent .  Patient will benefit from skilled intervention to decrease level of assist with basic self-care skills and increase independence with basic self-care skills prior to discharge home with care partner.  Anticipate patient will require intermittent supervision and follow up home health.   Skilled Therapeutic Intervention 2nd session:  Discussed wc mobility, toilet, tub transfers, sit to stand at kitchen.     OT Evaluation Precautions/Restrictions  Precautions Precautions: Fall Required Braces or Orthoses: Other Brace/Splint Other Brace/Splint: Cast splint on Right lower leg and foot.  Restrictions Weight Bearing Restrictions: Yes RLE Weight Bearing: Non weight bearing General   Pain  7/10 with movement; 3/10 static sitting Pain Assessment Pain Score:  3 with static sitting; 7/10 with dynamic movements Pain Type: Surgical pain Pain Location: Leg Pain Orientation: Right Pain Descriptors: Sharp Pain Onset: On-going Pain Intervention(s): RN made aware  (requested medication) Home Living/Prior Functioning:  Has been staying with daughter in [place] since October 2013.  She has a home in [place] that she eventually wants to get back to.   Home Living Lives With: Daughter Available Help at Discharge: Family;Available PRN/intermittently;Personal care attendant (uncertain of 24/7 care) Type of Home: House Home Access: Stairs to enter [employer] of Steps: 6 Entrance Stairs-Rails: Left Home Layout: One level Bathroom Shower/Tub: Forensic scientist: Standard Bathroom Accessibility: Yes How Accessible: Accessible via walker Home Adaptive Equipment:  (old RW that belonged to husband) Additional Comments: Doesn't want BSC IADL History Homemaking Responsibilities: Yes Meal Prep Responsibility: Primary Laundry Responsibility: Primary Cleaning Responsibility: Primary Bill Paying/Finance Responsibility: Primary Shopping Responsibility: No Homemaking Comments: daughter does when pt stays at her house Current License: No Mode of Transportation: Set designer Occupation: Retired Leisure and Hobbies: likes to clean Prior Function Level of Independence: Independent with homemaking with ambulation Able to Take Stairs?: Reciprically Driving: Yes 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 ADL ADL Grooming: Setup Where Assessed-Grooming: Wheelchair Upper Body Bathing: Setup Where Assessed-Upper Body Bathing: Sitting at sink Lower Body Bathing: Minimal assistance Where Assessed-Lower Body Bathing: Standing at sink;Sitting at sink;Wheelchair Upper Body Dressing: Setup Where Assessed-Upper Body Dressing: Sitting at sink Lower Body Dressing: Moderate assistance Where Assessed-Lower Body Dressing: Standing at sink;Sitting at sink;Wheelchair Toileting: Not assessed Where Assessed-Toileting: Teacher, adult education:  Curator Method: Art gallery manager Method: Stand pivot Tub/Shower Equipment: Emergency planning/management officer Vision/Perception:  No issues  Vision - History Baseline Vision: Wears glasses all the time Visual History: Cataracts (had cataracts removed) Patient Visual Report: No change from baseline  Cognition:  WFL Orientation Level: Oriented X4 Memory: Appears intact Problem Solving: Appears intact Sensationwfl Sensation Light Touch: Appears Intact Coordination Gross Motor Movements are Fluid and Coordinated: Yes Fine Motor Movements are Fluid and Coordinated: Yes Motor  Motor Motor: Within Functional Limits Mobility  Bed Mobility Bed Mobility: Rolling Right;Supine to Sit Rolling Right: 4: Min assist Rolling Right Details: Manual facilitation for placement Supine to Sit: 3: Mod assist (holding right LE) Transfers Sit to Stand: 4: Min assist;With upper extremity assist;With armrests;From chair/3-in-1 Stand to Sit: 4: Min assist  Trunk/Postural Assessment  Cervical Assessment Cervical Assessment: Within Functional Limits Thoracic Assessment Thoracic Assessment: Within Functional Limits Lumbar Assessment Lumbar Assessment: Within Functional Limits Postural Control Postural Control: Within Functional Limits  Balance Balance Balance Assessed: Yes Dynamic Sitting Balance Dynamic Sitting - Comments: reached to vloor Dynamic Standing Balance Dynamic Standing - Balance Support: Left upper extremity supported;During functional activity Dynamic Standing - Level of Assistance: 3: Mod assist Dynamic Standing - Balance Activities: Reaching for objects Extremity/Trunk Assessment RUE Assessment RUE Assessment: Within Functional Limits LUE Assessment LUE Assessment: Within Functional Limits     Refer to Care Plan for Long Term Goals  Recommendations for other services: None  Discharge Criteria: Patient will be discharged from OT if patient refuses  treatment 3 consecutive times without medical reason, if treatment goals not met, if there is a change in medical status, if patient makes no progress towards goals or if patient is discharged from hospital.  The above assessment, treatment plan, treatment alternatives and goals were discussed and mutually agreed upon: by patient  Humberto Seals  06/20/2012, 9:31 AM

## 2012-06-20 NOTE — Progress Notes (Signed)
Patient ID: [name], female   DOB: [birth year], 79 y.o.   MRN: [id number] Subjective/Complaints: 79 y.o. right-handed female with history of end-stage renal disease with hemodialysis, diastolic congestive heart failure, diabetes mellitus with peripheral neuropathy. Admitted 06/15/2012 with history of right distal 1/3 tibial shaft fracture in April 2013 treated nonoperatively with numerous modalities including splinting, cast and a CAM boot with bone stimulator. Patient with continued pain at fracture site. Underwent repair of right tibia nonunion with open reduction internal fixation and infuse allografting 06/16/2012 per Dr. Carola Frost. Nonweightbearing right lower extremity x6 weeks. Subcutaneous Lovenox for DVT prophylaxis. Postoperative pain management as directed. Chronic anemia 9.9   ROS Objective: Vital Signs: Blood pressure 145/71, pulse 71, temperature 99.7 F (37.6 C), temperature source Oral, resp. rate 18, height 5\' 2"  (1.575 m), weight 71.6 kg (157 lb 13.6 oz), SpO2 95.00%. No results found. Results for orders placed during the hospital encounter of 06/19/12 (from the past 72 hour(s))  GLUCOSE, CAPILLARY     Status: Abnormal   Collection Time    06/19/12  4:52 PM      Result Value Range   Glucose-Capillary 198 (*) 70 - 99 mg/dL  GLUCOSE, CAPILLARY     Status: None   Collection Time    06/19/12  9:23 PM      Result Value Range   Glucose-Capillary 98  70 - 99 mg/dL  GLUCOSE, CAPILLARY     Status: Abnormal   Collection Time    06/20/12  7:23 AM      Result Value Range   Glucose-Capillary 136 (*) 70 - 99 mg/dL   Comment 1 Notify RN       HEENT: normal Cardio: RRR and no murmurs Resp: CTA B/L and non labored GI: BS positive and non tender Extremity:  No Edema Skin:   Intact and Other unable to see surgical site RLE due to splint Neuro: Alert/Oriented, Normal Sensory, Abnormal Motor R HF 4/5 unable to check R ankle due to splint and Normal FMC Musc/Skel:  Other R foot and  ankle splint fitting well no constriction Gen NAD   Assessment/Plan: 1. Functional deficits secondary to R tibial non union s/p ORIF which require 3+ hours per day of interdisciplinary therapy in a comprehensive inpatient rehab setting. Physiatrist is providing close team supervision and 24 hour management of active medical problems listed below. Physiatrist and rehab team continue to assess barriers to discharge/monitor patient progress toward functional and medical goals. FIM:                   Comprehension Comprehension Mode: Auditory Comprehension: 7-Follows complex conversation/direction: With no assist  Expression Expression Mode: Verbal Expression: 6-Expresses complex ideas: With extra time/assistive device  Social Interaction Social Interaction: 7-Interacts appropriately with others - No medications needed.  Problem Solving Problem Solving: 6-Solves complex problems: With extra time  Memory Memory: 6-More than reasonable amt of time Medical Problem List and Plan:  1. Right tibia nonunion fracture status post ORIF 06/16/2012  2. DVT Prophylaxis/Anticoagulation: Subcutaneous Lovenox. Monitor platelet counts any signs of bleeding. Check venous dopplers to r/o dvt given nwb and immobilization of right leg.  3. Pain Management: Oxycodone as needed. Monitor with increased mobility  4. Neuropsych: This patient is capable of making decisions on his/her own behalf.  5. End stage renal disease with hemodialysis. Followup per renal services  6. Chronic anemia. Continue Aranesp as directed. Followup CBC with dialysis  7. Hypertension. Norvasc 10 mg daily, clonidine 0.2 mg twice  a day, M.D. or 30 mg daily. Monitor the increased mobility  8. Diabetes mellitus with peripheral neuropathy. Hemoglobin A1c 6.1. Check blood sugars a.c. and at bedtime. Continue sliding scale insulin and NovoLog 70/30 insulin 5 units twice a day  9. Diastolic congestive heart failure. Monitor for any  signs of fluid overload. i's and o's, weights.  10. Hypothyroidism. Synthroid  11. Hyperlipidemia. Zocor  12. History left mastectomy secondary to cancer.      LOS (Days) 1 A FACE TO FACE EVALUATION WAS PERFORMED  Kynzlee Hucker E 06/20/2012, 8:00 AM

## 2012-06-20 NOTE — Progress Notes (Signed)
Physical Therapy Session Note  Patient Details  Name: [name] MRN: [id number] Date of Birth: 03-Jul-1928  Today's Date: 06/20/2012 Time: 4401-0272 Time Calculation (min): 30 min  Short Term Goals: Week 1:  PT Short Term Goal 1 (Week 1): = long term goals  Skilled Therapeutic Interventions/Progress Updates:    Initially attempted to see pt, pt had just received lunch tray and beginning to eat. Pt requested PT to return in 15 minutes. Pt transported to rehab gym. Pt educated on LE there ex. Performed standing there ex, 2setsx10 reps each for mini squats to strengthen L LE and R hip abd to work on isometric strengthening of the L LE as well as concentric strengthening of the R LE. Pt transferred sit to stand multiple times during standing there ex with rolling walker and close supervision to min guard  with verbal cues for technique and safety. Pt requried rest breaks secondary to R LE pain. Pt returned to room. Pt transferred w/c to bed with rolling walker and min gaurd with vc for technique and safety. Pt was able to transfer from EOB to supine with c/s and verbal cues for technique. Pt moved up in bed independently. Pt positioned in bed with R LE elevated on 2 pillows for comfort. Pt maintained NWB R LE throughout treatment session.    Therapy Documentation Precautions:  Precautions Precautions: Fall Required Braces or Orthoses: Other Brace/Splint Other Brace/Splint: Cast splint on Right lower leg and foot.  Restrictions Weight Bearing Restrictions: Yes RLE Weight Bearing: Non weight bearing General: Amount of Missed PT Time (min): 15 Minutes Missed Time Reason: Other (comment) (Late Lunch delivery/needed food for blood sugar levels) Pain: Pain Assessment Pain Score:   6 Pain Type: Acute pain;Surgical pain Pain Location: Leg Pain Orientation: Right Pain Descriptors: Lambert Mody;Sore Pain Onset: On-going (worse in dependent position)  See FIM for current functional  status  Therapy/Group: Individual Therapy  Wilhemina Bonito 06/20/2012, 3:17 PM

## 2012-06-20 NOTE — Evaluation (Signed)
Physical Therapy Assessment and Plan  Patient Details  Name: NALEE LIGHTLE MRN: [id number] Date of Birth: 12/29/1928  PT Diagnosis: Abnormality of gait, Difficulty walking and Muscle weakness Rehab Potential: Good ELOS: 7-10 days   Today's Date: 06/20/2012 Time: 5621-3086 Time Calculation (min): 58 min  Problem List:  Patient Active Problem List  Diagnosis  . Tibia/fibula fracture  . ESRD (end stage renal disease)  . HTN (hypertension)  . DM2 (diabetes mellitus, type 2)  . Hyperlipidemia  . Anemia  . Hypothyroid  . Nonunion of fracture, R tibia  . Metabolic bone disease  . Secondary hyperparathyroidism of renal origin    Past Medical History:  Past Medical History  Diagnosis Date  . CHF (congestive heart failure)   . DM2 (diabetes mellitus, type 2)   . ESRD (end stage renal disease)     HD TTS - [facility]  . HTN (hypertension)   . Hyperlipidemia   . Tibia/fibula fracture 07/10/2011    right   . Hypothyroid   . Cancer     Breast  . Arthritis   . History of blood transfusion   . Hyperparathyroidism, secondary renal    Past Surgical History:  Past Surgical History  Procedure Laterality Date  . Mastectomy    . Abdominal surgery    . Thyroid surgery    . Breast surgery Left     Mastectomy  . Eye surgery Bilateral     Cataract  . Ankle fusion Right   . Arteriovenous graft placement Right   . Orif tibia fracture Right 06/16/2012    Procedure: OPEN REDUCTION INTERNAL FIXATION (ORIF) TIBIA FRACTURE;  Surgeon: Budd Palmer, MD;  Location: [facility] OR;  Service: Orthopedics;  Laterality: Right;    Assessment & Plan Clinical Impression: 79 y.o. right-handed female with history of end-stage renal disease with hemodialysis, diastolic congestive heart failure, diabetes mellitus with peripheral neuropathy. Admitted 06/15/2012 with history of right distal 1/3 tibial shaft fracture in April 2013 treated nonoperatively with numerous modalities including splinting, cast and a CAM  boot with bone stimulator. Patient with continued pain at fracture site. Underwent repair of right tibia nonunion with open reduction internal fixation and infuse allografting 06/16/2012 per Dr. Carola Frost. Nonweightbearing right lower extremity x6 weeks. Subcutaneous Lovenox for DVT prophylaxis. Postoperative pain management. Hemodialysis ongoing per renal services. She would like to go home but does not feel ready yet. Daughter works at [employer]. Patient [facility] on 06/19/2012 .   Patient currently requires min with mobility secondary to muscle weakness, unbalanced muscle activation and decreased standing balance, decreased balance strategies and difficulty maintaining precautions.  Pt most limited by increased need for assist with transfers, need for cues to adhere to NWB of Rt. LE, decreased endurance, and pain. Prior to hospitalization, patient was independent  with mobility and lived with Daughter in a House home. Pt's daughter works 5 days a week and pt feels she will likely not have supervision available at home. Home access is 6Stairs to enter.  Patient will benefit from skilled PT intervention to maximize safe functional mobility, minimize fall risk and decrease caregiver burden for planned discharge home with intermittent assist.  Anticipate patient will benefit from follow up [facility] at discharge.  PT - End of Session Endurance Deficit: Yes PT Assessment Rehab Potential: Good Barriers to Discharge: Decreased caregiver support PT Plan PT Intensity: Minimum of 1-2 x/day ,45 to 90 minutes PT Frequency: 5 out of 7 days PT Duration Estimated Length of Stay:  7-10 days PT Treatment/Interventions: Ambulation/gait training;Balance/vestibular training;Community reintegration;Discharge planning;Functional mobility training;DME/adaptive equipment instruction;Neuromuscular re-education;Pain management;Patient/family education;Psychosocial support;Stair training;Therapeutic  Activities;Therapeutic Exercise;UE/LE Strength taining/ROM;Wheelchair propulsion/positioning PT Recommendation Follow Up Recommendations: Home health PT Patient destination: Home Equipment Recommended: Rolling walker with 5" wheels;Wheelchair (measurements);Wheelchair cushion (measurements)  Skilled Therapeutic Intervention  Supine Rt. LE exercises: straight leg raises AAROM x 10 reps, quad set + hip abduction x 10 reps AAROM (therapist to support heel). Pt requires extended rest breaks secondary to pain in Rt. LE. Worked on wheelchair set up and management of brakes. Ambulation x 30', ~35' with RW and min assist cues needed for weight bearing precautions intermittently as pt tends to put Rt. Foot down for balance. Pt has decreased Lt. Knee stability with fatigue secondary to functional quad weakness. RW adjusted for correct height, leg rest on wheelchair adjusted.   PT Evaluation Precautions/Restrictions Precautions Precautions: Fall Required Braces or Orthoses: Other Brace/Splint Other Brace/Splint: Cast splint on Right lower leg and foot.  Restrictions Weight Bearing Restrictions: Yes RLE Weight Bearing: Non weight bearing General   Vital SignsTherapy Vitals BP: 155/74 mmHg Pain Pain Assessment Pain Assessment: 0-10 Pain Score:   6 Pain Type: Acute pain;Surgical pain Pain Location: Leg Pain Orientation: Right Pain Descriptors: Sore, sharp Pain Onset: Gradual Pain Intervention(s): RN called, requested medication Home Living/Prior Functioning Home Living Lives With: Daughter Available Help at Discharge: Family;Available PRN/intermittently;Personal care attendant (uncertain of 24/7 care) Type of Home: House Home Access: Stairs to enter [employer] of Steps: 6 Entrance Stairs-Rails: Left Home Layout: One level Bathroom Shower/Tub: Forensic scientist: Standard Bathroom Accessibility: Yes How Accessible: Accessible via walker Home Adaptive  Equipment:  (old RW that belonged to husband) Additional Comments: Doesn't want BSC Prior Function Level of Independence: Independent with homemaking with ambulation Able to Take Stairs?: Reciprically Driving: Yes 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 Vision/Perception  Vision - History Baseline Vision: Wears glasses all the time Visual History: Cataracts (had cataracts removed) Patient Visual Report: No change from baseline  Cognition Orientation Level: Oriented X4 Memory: Appears intact Awareness: Appears intact Problem Solving: Appears intact Comments: Slight impairments with memory, likely age related. Sensation Sensation Light Touch: Appears Intact Coordination Gross Motor Movements are Fluid and Coordinated: Yes Fine Motor Movements are Fluid and Coordinated: Yes Motor  Motor Motor: Within Functional Limits  Mobility Bed Mobility Bed Mobility: Supine to Sit Rolling Right: 4: Min assist Rolling Right Details: Manual facilitation for placement Supine to Sit: 4: Min assist (holding right LE) Sit to Supine: 4: Min assist (for Rt. LE) Transfers Sit to Stand: 4: Min assist;With upper extremity assist;With armrests;From chair/3-in-1 Sit to Stand Details: Verbal cues for sequencing;Verbal cues for precautions/safety;Verbal cues for safe use of DME/AE Sit to Stand Details (indicate cue type and reason): Cues for safe upper extremity placement, cues for brakes, cues for weight bearing precautions.  Stand to Sit: 4: Min assist Stand to Sit Details (indicate cue type and reason): Visual cues for safe use of DME/AE;Verbal cues for precautions/safety Stand to Sit Details: Cues needed at times to square with mat and back all the way up prior to sitting.  Locomotion  Ambulation Ambulation: Yes Ambulation/Gait Assistance: 4: Min assist Ambulation Distance (Feet): 30  Feet Assistive device: Rolling walker Ambulation/Gait Assistance Details: Verbal cues for maintaining weightbearing precautions, proximity of RW, and controlled landing on Lt. LE. Weakness of quads noted with fatigue. Gait Gait: Yes Gait Pattern: Impaired Gait Pattern:  ("  hop" gait) Stairs / Additional Locomotion Stairs: No (unsafe at this time) Corporate treasurer: Yes Wheelchair Assistance: 4: Administrator, sports Details:  (tactile cues for turning ) Occupational hygienist: Both upper extremities Wheelchair Parts Management: Needs assistance Distance: 25'  Trunk/Postural Assessment  Cervical Assessment Cervical Assessment: Within Functional Limits Thoracic Assessment Thoracic Assessment: Within Functional Limits Lumbar Assessment Lumbar Assessment: Within Functional Limits Postural Control Postural Control: Within Functional Limits  Balance Balance Balance Assessed: Yes Dynamic Sitting Balance Dynamic Sitting - Comments: reached to vloor Dynamic Standing Balance Dynamic Standing - Balance Support: Left upper extremity supported;During functional activity Dynamic Standing - Level of Assistance: 3: Mod assist Dynamic Standing - Balance Activities: Reaching for objects Dynamic Standing - Comments: Increased lateral sway noted in standing, not overly challenged other than gait secondary to pain with Rt. LE in dependent position. Extremity Assessment  RUE Assessment RUE Assessment: Within Functional Limits LUE Assessment LUE Assessment: Within Functional Limits RLE Assessment RLE Assessment: Exceptions to [facility] RLE AROM (degrees) RLE Overall AROM Comments: Overall WFL with exception of ankle ROM deficits secondary to cast on ankle. RLE Strength RLE Overall Strength: Deficits RLE Overall Strength Comments: Functional deficits noted with hip flexors/abductors and knee extensors however not tested with MMT secondary to pain.  LLE Assessment LLE  Assessment: Exceptions to WFL LLE AROM (degrees) LLE Overall AROM Comments: WFL LLE Strength LLE Overall Strength Comments: Generalized deconditioning, grossly >/= 4-/5  FIM:  FIM - Bed/Chair Transfer Bed/Chair Transfer: 4: Supine > Sit: Min A (steadying Pt. > 75%/lift 1 leg);4: Sit > Supine: Min A (steadying pt. > 75%/lift 1 leg);4: Bed > Chair or W/C: Min A (steadying Pt. > 75%);4: Chair or W/C > Bed: Min A (steadying Pt. > 75%) FIM - Locomotion: Wheelchair Distance: 25' Locomotion: Wheelchair: 1: Travels less than 50 ft with minimal assistance (Pt.>75%) FIM - Locomotion: Ambulation Locomotion: Ambulation Assistive Devices: Designer, industrial/product Ambulation/Gait Assistance: 4: Min assist Locomotion: Ambulation: 1: Travels less than 50 ft with minimal assistance (Pt.>75%) FIM - Locomotion: Stairs Locomotion: Stairs: 0: Activity did not occur (not safe to perform on evaluation)   Refer to Care Plan for Long Term Goals  Recommendations for other services: None  Discharge Criteria: Patient will be discharged from PT if patient refuses treatment 3 consecutive times without medical reason, if treatment goals not met, if there is a change in medical status, if patient makes no progress towards goals or if patient is discharged from hospital.  The above assessment, treatment plan, treatment alternatives and goals were discussed and mutually agreed upon: by patient  Wilhemina Bonito 06/20/2012, 10:44 AM

## 2012-06-20 NOTE — Progress Notes (Signed)
*  PRELIMINARY RESULTS* Vascular Ultrasound Lower extremity venous duplex has been completed.  Preliminary findings:   No evidence of DVT involving the right common femoral, right femoral, and right popliteal veins. Cannot rule out DVT in the right posterior tibial and peroneal veins due to bandages. No evidence of DVT involving the left lower extremity.     Farrel Demark, RDMS, RVT  06/20/2012, 11:08 AM

## 2012-06-21 ENCOUNTER — Inpatient Hospital Stay (HOSPITAL_COMMUNITY): Payer: Medicare Other | Admitting: *Deleted

## 2012-06-21 DIAGNOSIS — S82201A Unspecified fracture of shaft of right tibia, initial encounter for closed fracture: Secondary | ICD-10-CM

## 2012-06-21 HISTORY — DX: Unspecified fracture of shaft of right tibia, initial encounter for closed fracture: S82.201A

## 2012-06-21 LAB — GLUCOSE, CAPILLARY
Glucose-Capillary: 129 mg/dL — ABNORMAL HIGH (ref 70–99)
Glucose-Capillary: 128 mg/dL — ABNORMAL HIGH (ref 70–99)
Glucose-Capillary: 173 mg/dL — ABNORMAL HIGH (ref 70–99)

## 2012-06-21 LAB — ANAEROBIC CULTURE

## 2012-06-21 MED ORDER — SENNOSIDES-DOCUSATE SODIUM 8.6-50 MG PO TABS
2.0000 | ORAL_TABLET | Freq: Two times a day (BID) | ORAL | Status: DC
  Administered 2012-06-21 – 2012-06-24 (×7): 2 via ORAL
  Filled 2012-06-21 (×8): qty 2

## 2012-06-21 MED ORDER — INSULIN ASPART PROT & ASPART (70-30 MIX) 100 UNIT/ML ~~LOC~~ SUSP
7.0000 [IU] | Freq: Two times a day (BID) | SUBCUTANEOUS | Status: DC
  Administered 2012-06-21 – 2012-06-24 (×3): 7 [IU] via SUBCUTANEOUS
  Filled 2012-06-21: qty 10

## 2012-06-21 NOTE — Progress Notes (Signed)
Occupational Therapy Note  Patient Details  Name: [name] MRN: [id number] Date of Birth: 1929-03-02 Today's Date: 06/21/2012  Time:  1400-1445  (( ) Pain:  None Individual  Session  Performed toilet transfers, standing balance, functional mobility, therapeutic exercises.  Pt. Ambulated with RW from bed to bathroom with minimal assist and transferred to toilet with cues not to use grab bar.  Pt was mod I with toileting.  She used wall for balance during standing to don pants.  Ambulated to sink and had 1 LOB when her hand slipped off the sink.  OT assisted with recovery.  Performed UE therapeutic exercises with no pain.     Humberto Seals 06/21/2012, 4:16 PM

## 2012-06-21 NOTE — Progress Notes (Signed)
Patient ID: [name], female   DOB: 1929/01/08, 79 y.o.   MRN: [id number] Subjective/Complaints: 79 y.o. right-handed female with history of end-stage renal disease with hemodialysis, diastolic congestive heart failure, diabetes mellitus with peripheral neuropathy. Admitted 06/15/2012 with history of right distal 1/3 tibial shaft fracture in April 2013 treated nonoperatively with numerous modalities including splinting, cast and a CAM boot with bone stimulator. Patient with continued pain at fracture site. Underwent repair of right tibia nonunion with open reduction internal fixation and infuse allografting 06/16/2012 per Dr. Carola Frost. Nonweightbearing right lower extremity x6 weeks. Subcutaneous Lovenox for DVT prophylaxis. Postoperative pain management as directed. Chronic anemia 9.9  Slept ok RN gave medicine for constipation  Review of Systems  Gastrointestinal: Positive for constipation. Negative for nausea, vomiting and abdominal pain.       No bm since 4/1 per pt  Musculoskeletal: Positive for joint pain.  All other systems reviewed and are negative.   Objective: Vital Signs: Blood pressure 162/66, pulse 73, temperature 98.1 F (36.7 C), temperature source Oral, resp. rate 18, height 5\' 2"  (1.575 m), weight 71.6 kg (157 lb 13.6 oz), SpO2 93.00%. No results found. Results for orders placed during the hospital encounter of 06/19/12 (from the past 72 hour(s))  GLUCOSE, CAPILLARY     Status: Abnormal   Collection Time    06/19/12  4:52 PM      Result Value Range   Glucose-Capillary 198 (*) 70 - 99 mg/dL  GLUCOSE, CAPILLARY     Status: None   Collection Time    06/19/12  9:23 PM      Result Value Range   Glucose-Capillary 98  70 - 99 mg/dL  GLUCOSE, CAPILLARY     Status: Abnormal   Collection Time    06/20/12  7:23 AM      Result Value Range   Glucose-Capillary 136 (*) 70 - 99 mg/dL   Comment 1 Notify RN    GLUCOSE, CAPILLARY     Status: Abnormal   Collection Time    06/20/12  11:32 AM      Result Value Range   Glucose-Capillary 202 (*) 70 - 99 mg/dL   Comment 1 Notify RN       HEENT: normal Cardio: RRR and no murmurs Resp: CTA B/L and non labored GI: BS positive and non tender Extremity:  No Edema Skin:   Intact and Other unable to see surgical site RLE due to splint Neuro: Alert/Oriented, Normal Sensory, Abnormal Motor R HF 4/5 unable to check R ankle due to splint and Normal FMC Musc/Skel:  Other R foot and ankle splint fitting well no constriction Gen NAD   Assessment/Plan: 1. Functional deficits secondary to R tibial non union s/p ORIF which require 3+ hours per day of interdisciplinary therapy in a comprehensive inpatient rehab setting. Physiatrist is providing close team supervision and 24 hour management of active medical problems listed below. Physiatrist and rehab team continue to assess barriers to discharge/monitor patient progress toward functional and medical goals. FIM: FIM - Bathing Bathing Steps Patient Completed: Chest;Right Arm;Left Arm;Abdomen;Right upper leg;Left upper leg;Front perineal area Bathing: 3: Mod-Patient completes 5-7 58f 10 parts or 50-74%  FIM - Upper Body Dressing/Undressing Upper body dressing/undressing steps patient completed: Thread/unthread right sleeve of pullover shirt/dresss;Thread/unthread left sleeve of pullover shirt/dress;Put head through opening of pull over shirt/dress;Pull shirt over trunk Upper body dressing/undressing: 4: Steadying assist FIM - Lower Body Dressing/Undressing Lower body dressing/undressing steps patient completed: Thread/unthread left pants leg Lower body dressing/undressing:  3: Mod-Patient completed 50-74% of tasks  FIM - Toileting Toileting steps completed by patient: Adjust clothing prior to toileting;Performs perineal hygiene Toileting Assistive Devices: Grab bar or rail for support Toileting: 3: Mod-Patient completed 2 of 3 steps  FIM - Scientist, research (physical sciences) Devices: Grab bars Toilet Transfers: 3-To toilet/BSC: Mod A (lift or lower assist);3-From toilet/BSC: Mod A (lift or lower assist)  FIM - Bed/Chair Transfer Bed/Chair Transfer Assistive Devices: Arm rests;Bed rails Bed/Chair Transfer: 4: Bed > Chair or W/C: Min A (steadying Pt. > 75%);4: Chair or W/C > Bed: Min A (steadying Pt. > 75%);5: Supine > Sit: Supervision (verbal cues/safety issues)  FIM - Locomotion: Wheelchair Distance: 25' Locomotion: Wheelchair: 1: Travels less than 50 ft with supervision, cueing or coaxing FIM - Locomotion: Ambulation Locomotion: Ambulation Assistive Devices: Designer, industrial/product Ambulation/Gait Assistance: 4: Min assist Locomotion: Ambulation: 1: Travels less than 50 ft with minimal assistance (Pt.>75%)  Comprehension Comprehension Mode: Auditory Comprehension: 7-Follows complex conversation/direction: With no assist  Expression Expression Mode: Verbal Expression: 6-Expresses complex ideas: With extra time/assistive device  Social Interaction Social Interaction: 6-Interacts appropriately with others with medication or extra time (anti-anxiety, antidepressant).  Problem Solving Problem Solving: 6-Solves complex problems: With extra time  Memory Memory: 6-More than reasonable amt of time Medical Problem List and Plan:  1. Right tibia nonunion fracture status post ORIF 06/16/2012  2. DVT Prophylaxis/Anticoagulation: Subcutaneous Lovenox. Monitor platelet counts any signs of bleeding. Check venous dopplers to r/o dvt given nwb and immobilization of right leg.  3. Pain Management: Oxycodone as needed. Monitor with increased mobility  4. Neuropsych: This patient is capable of making decisions on his/her own behalf.  5. End stage renal disease with hemodialysis. Followup per renal services  6. Chronic anemia. Continue Aranesp as directed. Followup CBC with dialysis  7. Hypertension. Norvasc 10 mg daily, clonidine 0.2 mg twice a day, M.D. or 30 mg  daily. Monitor the increased mobility  8. Diabetes mellitus with peripheral neuropathy. Hemoglobin A1c 6.1. Check blood sugars a.c. and at bedtime. Continue sliding scale insulin and Increase NovoLog 70/30 insulin 7 units twice a day  9. Diastolic congestive heart failure. Monitor for any signs of fluid overload. i's and o's, weights.  10. Hypothyroidism. Synthroid  11. Hyperlipidemia. Zocor  12. History left mastectomy secondary to cancer.  13.  Constipation will order senna BID    LOS (Days) 2 A FACE TO FACE EVALUATION WAS PERFORMED  Martel Galvan E 06/21/2012, 7:45 AM

## 2012-06-21 NOTE — Progress Notes (Signed)
S: Eating well.  No BM since surgery, taking laxative.  Pain controlled O:BP 162/66  Pulse 73  Temp(Src) 98.1 F (36.7 C) (Oral)  Resp 18  Ht 5\' 2"  (1.575 m)  Wt 71.6 kg (157 lb 13.6 oz)  BMI 28.86 kg/m2  SpO2 93%  Intake/Output Summary (Last 24 hours) at 06/21/12 0818 Last data filed at 06/20/12 1700  Gross per 24 hour  Intake    840 ml  Output      0 ml  Net    840 ml   Weight change:  AVW:UJWJX and alert CVS:RRR Resp:clear Abd:+ BS NTND Ext: no edema on LT  Rt leg casted NEURO:Ox3 CNI    . amLODipine  10 mg Oral Daily  . aspirin EC  81 mg Oral Daily  . calcium acetate  667 mg Oral TID WC  . cloNIDine  0.2 mg Oral BID  . [START ON 06/24/2012] darbepoetin (ARANESP) injection - DIALYSIS  40 mcg Intravenous Q Wed-HD  . [START ON 06/22/2012] doxercalciferol  3 mcg Intravenous Q M,W,F-HD  . enoxaparin (LOVENOX) injection  30 mg Subcutaneous Q24H  . [START ON 06/24/2012] ferric gluconate (FERRLECIT/NULECIT) IV  62.5 mg Intravenous Q Wed-HD  . insulin aspart  0-5 Units Subcutaneous QHS  . insulin aspart protamine-insulin aspart  7 Units Subcutaneous BID WC  . isosorbide mononitrate  30 mg Oral Daily  . levothyroxine  50 mcg Oral Q breakfast  . multivitamin  1 tablet Oral QHS  . pantoprazole  40 mg Oral Q1200  . senna-docusate  2 tablet Oral BID  . simvastatin  10 mg Oral QHS  . [START ON 06/25/2012] Vitamin D (Ergocalciferol)  50,000 Units Oral Q Thu   No results found. BMET    Component Value Date/Time   NA 139 06/17/2012 1357   K 4.9 06/17/2012 1357   CL 100 06/17/2012 1357   CO2 26 06/17/2012 1357   GLUCOSE 157* 06/17/2012 1357   BUN 46* 06/17/2012 1357   CREATININE 8.93* 06/17/2012 1357   CALCIUM 8.3* 06/17/2012 1357   CALCIUM 7.6* 08/28/2009 1044   GFRNONAA 4* 06/17/2012 1357   GFRAA 4* 06/17/2012 1357   CBC    Component Value Date/Time   WBC 12.5* 06/19/2012 0810   RBC 3.18* 06/19/2012 0810   HGB 10.0* 06/19/2012 0810   HCT 31.2* 06/19/2012 0810   PLT 287 06/19/2012 0810   MCV 98.1  06/19/2012 0810   MCH 31.4 06/19/2012 0810   MCHC 32.1 06/19/2012 0810   RDW 15.2 06/19/2012 0810   LYMPHSABS 0.8 06/19/2012 0810   MONOABS 1.5* 06/19/2012 0810   EOSABS 0.5 06/19/2012 0810   BASOSABS 0.0 06/19/2012 0810     Assessment: 1. SP ORIF Rt Tibia 2. HTN 3. Anemia on aranesp 4. Sec HPTH on hectorol 5.ESRD  Plan: 1. HD tomorrow 2. Recheck labs with HD  [name] T

## 2012-06-21 NOTE — Plan of Care (Signed)
Problem: RH BOWEL ELIMINATION Goal: RH STG MANAGE BOWEL WITH ASSISTANCE STG Manage Bowel with supervision.  Outcome: Not Progressing Patient given sorbitol this morning with no results so far. Now on scheduled laxative BID

## 2012-06-22 ENCOUNTER — Inpatient Hospital Stay (HOSPITAL_COMMUNITY): Payer: Medicare Other

## 2012-06-22 ENCOUNTER — Inpatient Hospital Stay (HOSPITAL_COMMUNITY): Payer: Medicare Other | Admitting: *Deleted

## 2012-06-22 DIAGNOSIS — S82209A Unspecified fracture of shaft of unspecified tibia, initial encounter for closed fracture: Secondary | ICD-10-CM

## 2012-06-22 DIAGNOSIS — W19XXXA Unspecified fall, initial encounter: Secondary | ICD-10-CM

## 2012-06-22 DIAGNOSIS — E1165 Type 2 diabetes mellitus with hyperglycemia: Secondary | ICD-10-CM

## 2012-06-22 DIAGNOSIS — IMO0001 Reserved for inherently not codable concepts without codable children: Secondary | ICD-10-CM

## 2012-06-22 DIAGNOSIS — N186 End stage renal disease: Secondary | ICD-10-CM

## 2012-06-22 LAB — GLUCOSE, CAPILLARY
Glucose-Capillary: 162 mg/dL — ABNORMAL HIGH (ref 70–99)
Glucose-Capillary: 98 mg/dL (ref 70–99)
Glucose-Capillary: 141 mg/dL — ABNORMAL HIGH (ref 70–99)

## 2012-06-22 LAB — CBC
HCT: 30 % — ABNORMAL LOW (ref 36.0–46.0)
RBC: 3.15 MIL/uL — ABNORMAL LOW (ref 3.87–5.11)
RDW: 14.9 % (ref 11.5–15.5)
WBC: 9.5 10*3/uL (ref 4.0–10.5)

## 2012-06-22 LAB — RENAL FUNCTION PANEL
Albumin: 2.8 g/dL — ABNORMAL LOW (ref 3.5–5.2)
BUN: 39 mg/dL — ABNORMAL HIGH (ref 6–23)
Chloride: 94 mEq/L — ABNORMAL LOW (ref 96–112)
GFR calc non Af Amer: 3 mL/min — ABNORMAL LOW (ref >90)
Potassium: 5.2 mEq/L — ABNORMAL HIGH (ref 3.5–5.1)

## 2012-06-22 MED ORDER — CALCIUM CARBONATE ANTACID 500 MG PO CHEW
1.0000 | CHEWABLE_TABLET | Freq: Three times a day (TID) | ORAL | Status: DC | PRN
  Administered 2012-06-22: 200 mg via ORAL
  Filled 2012-06-22: qty 1

## 2012-06-22 MED ORDER — DOXERCALCIFEROL 4 MCG/2ML IV SOLN
INTRAVENOUS | Status: AC
  Administered 2012-06-22: 3 ug via INTRAVENOUS
  Filled 2012-06-22: qty 2

## 2012-06-22 MED ORDER — CALCIUM CARBONATE ANTACID 500 MG PO CHEW
2.0000 | CHEWABLE_TABLET | Freq: Three times a day (TID) | ORAL | Status: DC | PRN
  Filled 2012-06-22 (×2): qty 2

## 2012-06-22 MED ORDER — OXYCODONE HCL 5 MG PO TABS
ORAL_TABLET | ORAL | Status: AC
  Administered 2012-06-22: 5 mg via ORAL
  Filled 2012-06-22: qty 1

## 2012-06-22 NOTE — Progress Notes (Signed)
Physical Therapy Session Note  Patient Details  Name: ARBUTUS NELLIGAN MRN: [id number] Date of Birth: July 08, 1928  Today's Date: 06/22/2012 Time: 4098-1191 Time Calculation (min): 55 min  Short Term Goals: Week 1:  PT Short Term Goal 1 (Week 1): = long term goals  Skilled Therapeutic Interventions/Progress Updates:    w/c propulsion for endurance and strengthening x 120' (limited due to RUE fatigue/discomfort). Practiced stand step transfers with RW as well as squat pivot transfers with close S; able to maintain NWB restriction without cues. Supine therex for strengthening and AAROM to RLE including heel slides, hip abduction, SAQ with 3 second hold, and quad/glut sets wit 5 second hold x 10 reps x 2 sets each.  Gait training with RW x 40' and then x 25' with seated rest break; initially close S but required steady A as fatigued.   Therapy Documentation Precautions:  Precautions Precautions: Fall Required Braces or Orthoses: Other Brace/Splint Other Brace/Splint: Cast splint on Right lower leg and foot.  Restrictions Weight Bearing Restrictions: Yes RLE Weight Bearing: Non weight bearing  Pain: Reports pain in R lower leg - declines need for pain medication at this time.   See FIM for current functional status  Therapy/Group: Individual Therapy  Karolee Stamps [facility] 06/22/2012, 9:29 AM

## 2012-06-22 NOTE — Progress Notes (Signed)
Pt c/o CP 3/10; Per Jesusita Oka PA K-pad to be used prn for musculoskeletal pain. Will continue to monitor pt

## 2012-06-22 NOTE — Plan of Care (Signed)
Problem: RH KNOWLEDGE DEFICIT Goal: RH STG INCREASE KNOWLEDGE OF DIABETES Outcome: Progressing Pt aware of blood glucose parameters that does not require insulin administration

## 2012-06-22 NOTE — Progress Notes (Signed)
Subjective:  Feeling well, but reports shortness of breath last night and swelling in right leg (with cast).  Objective: Vital signs in last 24 hours: Temp:  [98 F (36.7 C)-98.2 F (36.8 C)] 98 F (36.7 C) (04/07 1329) Pulse Rate:  [63-75] 64 (04/07 1400) Resp:  [16-18] 16 (04/07 1400) BP: (152-184)/(65-75) 174/65 mmHg (04/07 1400) SpO2:  [94 %-96 %] 94 % (04/07 1329) Weight:  [71 kg (156 lb 8.4 oz)] 71 kg (156 lb 8.4 oz) (04/07 1329) Weight change:   Intake/Output from previous day: 04/06 0701 - 04/07 0700 In: 480 [P.O.:480] Out: -  Intake/Output this shift: Total I/O In: 120 [P.O.:120] Out: -  EXAM: General appearance:  Alert, in no apparent distress Resp:  CTA without rales, rhonchi, or wheezes Cardio:  RRR without murmur or rub GI:  + BS, soft and nontender Extremities:  No left leg edema, cast on right Access:  AVF @ RUA with + bruit  Lab Results:  Recent Labs  06/22/12 1300  WBC 9.5  HGB 9.8*  HCT 30.0*  PLT 341   BMET: No results found for this basename: NA, K, CL, CO2, GLUCOSE, BUN, CREATININE, CALCIUM, PHOSPHORUS, ALBUMIN,  in the last 72 hours No results found for this basename: PTH,  in the last 72 hours Iron Studies: No results found for this basename: IRON, TIBC, TRANSFERRIN, FERRITIN,  in the last 72 hours  Dialysis Orders: Center: [place] - transient who visits frequently from [place] MWF 3.25 hours 2 K 2.25 EDW 68 heparin 2000 Epo 4400 TIW venofer 50/week hectorol 3 TIW  Assessment/Plan: 1. S/p ORIF of right tibia - per Dr. Carola Frost, rehab since 4/4. 2. ESRD - HD on MWF, transient from [place], but receives HD at [place] when visiting.  HD today. 3. HTN/Volume - BP 174/65 on Amlodipine 10 mg qd, Clonidine 0.2 mg bid; current wt 71 kg with EDW 68.  Challenge for extra fluid removal. 4. Anemia - Hgb 9.8, Aranesp 40 mcg on Wed. 5. Secondary hyperparathyroidism - Ca 8.3 (8.9 corrected), P 5.9; Hectorol 3 mcg, Phoslo 1 with meals. 6. Nutrition - Alb 3.3,  high protein renal diet, vitamin. 7. DM - on insulin. 8. Hypothyroidism - on Synthroid.   LOS: 3 days   LYLES,CHARLES 06/22/2012,2:18 PM  Patient seen and examined.  Agree with assessment and plan as above. Vinson Moselle  MD [phone] pgr    [phone] cell 06/22/2012, 3:11 PM

## 2012-06-22 NOTE — Progress Notes (Signed)
Patient information reviewed and entered into eRehab system by Andilyn Bettcher, RN, CRRN, PPS Coordinator.  Information including medical coding and functional independence measure will be reviewed and updated through discharge.     Per nursing patient was given "Data Collection Information Summary for Patients in Inpatient Rehabilitation Facilities with attached "Privacy Act Statement-Health Care Records" upon admission.  

## 2012-06-22 NOTE — Progress Notes (Signed)
Occupational Therapy Session Note  Patient Details  Name: LAVERNE HURSEY MRN: [id number] Date of Birth: [birth year]  Today's Date: 06/22/2012 Time: 1100-1140  (41min)STG-LTG   Short Term Goals: Week 1:   STG=LTG     Skilled Therapeutic Interventions/Progress Updates:    Engaged in functional mobility in ADL apartment.  Addressed balance in standing, transfers, wc mobility, endurance.  Pt. Ambulated with RW into bathroom and transferred to tub bench with minimal assist with RLE.  Pt. SOB and takes rest breaks as needed when doing an activity.  Pt. Propelled wc independently on level surface.  Needed minimal instructional cues removing and putting on leg rests.  Grand daughter, Durward Mallard observed session.    Therapy Documentation Precautions:  Precautions Precautions: Fall Required Braces or Orthoses: Other Brace/Splint Other Brace/Splint: Cast splint on Right lower leg and foot.  Restrictions Weight Bearing Restrictions: Yes RLE Weight Bearing: Non weight bearing       Pain: Pain Assessment Pain Assessment: 0-10 Pain Score:   4 Pain Location: Leg Pain Orientation: Right Pain Descriptors: Lambert Mody;Throbbing    See FIM for current functional status  Therapy/Group: Individual Therapy  Humberto Seals 06/22/2012, 11:16 AM

## 2012-06-22 NOTE — Progress Notes (Signed)
Social Work  Social Work Assessment and Plan  Patient Details  Name: [name] MRN: [id number] Date of Birth: [birth year]  Today's Date: 06/22/2012  Problem List:  Patient Active Problem List  Diagnosis  . Tibia/fibula fracture  . ESRD (end stage renal disease)  . HTN (hypertension)  . DM2 (diabetes mellitus, type 2)  . Hyperlipidemia  . Anemia  . Hypothyroid  . Nonunion of fracture, R tibia  . Metabolic bone disease  . Secondary hyperparathyroidism of renal origin  . Right tibial fracture   Past Medical History:  Past Medical History  Diagnosis Date  . CHF (congestive heart failure)   . DM2 (diabetes mellitus, type 2)   . ESRD (end stage renal disease)     HD TTS - [facility]  . HTN (hypertension)   . Hyperlipidemia   . Tibia/fibula fracture 07/10/2011    right   . Hypothyroid   . Cancer     Breast  . Arthritis   . History of blood transfusion   . Hyperparathyroidism, secondary renal    Past Surgical History:  Past Surgical History  Procedure Laterality Date  . Mastectomy    . Abdominal surgery    . Thyroid surgery    . Breast surgery Left     Mastectomy  . Eye surgery Bilateral     Cataract  . Ankle fusion Right   . Arteriovenous graft placement Right   . Orif tibia fracture Right 06/16/2012    Procedure: OPEN REDUCTION INTERNAL FIXATION (ORIF) TIBIA FRACTURE;  Surgeon: Budd Palmer, MD;  Location: [facility] OR;  Service: Orthopedics;  Laterality: Right;   Social History:  reports that she has never smoked. Her smokeless tobacco use includes Snuff. She reports that she does not drink alcohol or use illicit drugs.  Family / Support Systems Marital Status: Widow/Widower How Long?: 2 years Patient Roles: Parent Children: daughter, Marquis Lunch") Winger @ (H) [phone]; [phone]) [phone] or (C) [phone];  also has adult son in [place]. Anticipated Caregiver: daughter & her family Ability/Limitations of Caregiver: S-Min A Caregiver Availability: 24/7 Family  Dynamics: pt describes close relationship with her daughter as well as her son-in-law  Social History Preferred language: English Religion: African [employer] Cultural Background: NA Read: Yes Write: Yes Employment Status: Retired Fish farm manager Issues: None Guardian/Conservator: None   Abuse/Neglect Physical Abuse: Denies Verbal Abuse: Denies Sexual Abuse: Denies Exploitation of patient/patient's resources: Denies Self-Neglect: Denies  Emotional Status Pt's affect, behavior adn adjustment status: Pt very pleasant, talkative with this SW and A&O x4.   Admits frustration with lengthy "ordeal" with attempts to heal tibial fx which actually occured a year ago, but denies any significant emotional distress.  No s/s of depression/ anxiety - will monitor Recent Psychosocial Issues: None other than prolonged management of tibial fx. Pyschiatric History: None Substance Abuse History: None  Patient / Family Perceptions, Expectations & Goals Pt/Family understanding of illness & functional limitations: Pt and daughter with good understanding of surgery performed and of current WB restrictions and functional limitations. Premorbid pt/family roles/activities: Pt has spent time over the past year coming back and forth between her home in [place] and her daughter's home in [place] with family assisting as needed. Anticipated changes in roles/activities/participation: Pt may have increase in assistance needs until allowed to weight bear Pt/family expectations/goals: "I want to just get through this."  [employer]: None Premorbid Home Care/DME Agencies: Other (Comment) [facility] has followed in past)  Discharge Planning Living Arrangements: Alone (  if at her home vs. with daughter in [place]) Support Systems: Children Type of Residence: Private residence Insurance Resources: Administrator (specify) Financial Resources: Patent examiner Screen Referred: No Living Expenses: Own Money Management: Patient Do you have any problems obtaining your medications?: No Home Management: pt with family help as needed Patient/Family Preliminary Plans: Pt plans to d/c home with daughter and her family. Social Work Anticipated Follow Up Needs: HH/OP Expected length of stay: 7-10 days  Clinical Impression Pleasant, oriented woman here after surgical intervention for non-healing tibial fx.  Good family support and plans to d/c home with daughter to her home in [place] (pt's home in [place]).  No significant emotional distress noted - will monitor.  Spring San 06/22/2012, 3:57 PM

## 2012-06-22 NOTE — Progress Notes (Signed)
Patient ID: [name], female   DOB: 1928/12/06, 79 y.o.   MRN: [id number] Subjective/Complaints: Happy to have had bm. Right leg throbs on occasion. Otherwise pleased with progress.   Review of Systems    Musculoskeletal: Positive for joint pain.  All other systems reviewed and are negative.   Objective: Vital Signs: Blood pressure 152/75, pulse 75, temperature 98.1 F (36.7 C), temperature source Oral, resp. rate 18, height 5\' 2"  (1.575 m), weight 71.6 kg (157 lb 13.6 oz), SpO2 95.00%. No results found. Results for orders placed during the hospital encounter of 06/19/12 (from the past 72 hour(s))  GLUCOSE, CAPILLARY     Status: Abnormal   Collection Time    06/19/12  4:52 PM      Result Value Range   Glucose-Capillary 198 (*) 70 - 99 mg/dL  GLUCOSE, CAPILLARY     Status: None   Collection Time    06/19/12  9:23 PM      Result Value Range   Glucose-Capillary 98  70 - 99 mg/dL  GLUCOSE, CAPILLARY     Status: Abnormal   Collection Time    06/20/12  7:23 AM      Result Value Range   Glucose-Capillary 136 (*) 70 - 99 mg/dL   Comment 1 Notify RN    GLUCOSE, CAPILLARY     Status: Abnormal   Collection Time    06/20/12 11:32 AM      Result Value Range   Glucose-Capillary 202 (*) 70 - 99 mg/dL   Comment 1 Notify RN    GLUCOSE, CAPILLARY     Status: Abnormal   Collection Time    06/20/12  4:48 PM      Result Value Range   Glucose-Capillary 197 (*) 70 - 99 mg/dL   Comment 1 Notify RN    GLUCOSE, CAPILLARY     Status: Abnormal   Collection Time    06/20/12  9:59 PM      Result Value Range   Glucose-Capillary 129 (*) 70 - 99 mg/dL   Comment 1 Notify RN    GLUCOSE, CAPILLARY     Status: Abnormal   Collection Time    06/21/12  7:22 AM      Result Value Range   Glucose-Capillary 128 (*) 70 - 99 mg/dL   Comment 1 Notify RN    GLUCOSE, CAPILLARY     Status: Abnormal   Collection Time    06/21/12 11:55 AM      Result Value Range   Glucose-Capillary 186 (*) 70 - 99 mg/dL    Comment 1 Notify RN    GLUCOSE, CAPILLARY     Status: Abnormal   Collection Time    06/21/12  4:33 PM      Result Value Range   Glucose-Capillary 173 (*) 70 - 99 mg/dL  GLUCOSE, CAPILLARY     Status: Abnormal   Collection Time    06/21/12  9:28 PM      Result Value Range   Glucose-Capillary 111 (*) 70 - 99 mg/dL     HEENT: normal Cardio: RRR and no murmurs Resp: CTA B/L and non labored GI: BS positive and non tender Extremity:  No Edema Skin:   Intact and Other unable to see surgical site RLE due to splint Neuro: Alert/Oriented, Normal Sensory, Abnormal Motor R HF 4/5 unable to check R ankle due to splint and Normal FMC Musc/Skel:  Other R foot and ankle splint fitting well no constriction Gen NAD  Assessment/Plan: 1. Functional deficits secondary to R tibial non union s/p ORIF which require 3+ hours per day of interdisciplinary therapy in a comprehensive inpatient rehab setting. Physiatrist is providing close team supervision and 24 hour management of active medical problems listed below. Physiatrist and rehab team continue to assess barriers to discharge/monitor patient progress toward functional and medical goals. FIM: FIM - Bathing Bathing Steps Patient Completed: Chest;Right Arm;Left Arm;Abdomen;Front perineal area;Buttocks;Right upper leg;Left upper leg;Left lower leg (including foot) Bathing: 5: Set-up assist to: Obtain items  FIM - Upper Body Dressing/Undressing Upper body dressing/undressing steps patient completed: Thread/unthread right bra strap;Thread/unthread left bra strap;Hook/unhook bra;Thread/unthread right sleeve of pullover shirt/dresss;Thread/unthread left sleeve of pullover shirt/dress;Put head through opening of pull over shirt/dress;Pull shirt over trunk Upper body dressing/undressing: 5: Set-up assist to: Obtain clothing/put away FIM - Lower Body Dressing/Undressing Lower body dressing/undressing steps patient completed: Thread/unthread right underwear  leg;Thread/unthread left underwear leg;Pull underwear up/down;Thread/unthread left pants leg;Thread/unthread right pants leg;Pull pants up/down;Don/Doff left shoe Lower body dressing/undressing: 4: Min-Patient completed 75 plus % of tasks  FIM - Toileting Toileting steps completed by patient: Adjust clothing prior to toileting;Performs perineal hygiene Toileting Assistive Devices: Grab bar or rail for support Toileting: 0: Activity did not occur  FIM - Diplomatic Services operational officer Devices: Grab bars Toilet Transfers: 0-Activity did not occur  FIM - Banker Devices: Arm rests;Bed rails Bed/Chair Transfer: 5: Supine > Sit: Supervision (verbal cues/safety issues);5: Bed > Chair or W/C: Supervision (verbal cues/safety issues)  FIM - Locomotion: Wheelchair Distance: 25' Locomotion: Wheelchair: 1: Travels less than 50 ft with supervision, cueing or coaxing FIM - Locomotion: Ambulation Locomotion: Ambulation Assistive Devices: Designer, industrial/product Ambulation/Gait Assistance: 4: Min assist Locomotion: Ambulation: 1: Travels less than 50 ft with minimal assistance (Pt.>75%)  Comprehension Comprehension Mode: Auditory Comprehension: 7-Follows complex conversation/direction: With no assist  Expression Expression Mode: Verbal Expression: 6-Expresses complex ideas: With extra time/assistive device  Social Interaction Social Interaction: 6-Interacts appropriately with others with medication or extra time (anti-anxiety, antidepressant).  Problem Solving Problem Solving: 6-Solves complex problems: With extra time  Memory Memory: 6-More than reasonable amt of time Medical Problem List and Plan:  1. Right tibia nonunion fracture status post ORIF 06/16/2012  2. DVT Prophylaxis/Anticoagulation: Subcutaneous Lovenox. Monitor platelet counts any signs of bleeding. Check venous dopplers to r/o dvt given nwb and immobilization of right leg.  3.  Pain Management: Oxycodone as needed. Monitor with increased mobility  4. Neuropsych: This patient is capable of making decisions on his/her own behalf.  5. End stage renal disease with hemodialysis. Followup per renal services  6. Chronic anemia. Continue Aranesp as directed. Followup CBC with dialysis  7. Hypertension. Norvasc 10 mg daily, clonidine 0.2 mg twice a day, M.D. or 30 mg daily. Monitor the increased mobility  8. Diabetes mellitus with peripheral neuropathy. Hemoglobin A1c 6.1. Check blood sugars a.c. and at bedtime. Continue sliding scale insulin and Increased NovoLog 70/30 insulin 7 units twice a day. May need further titration. 9. Diastolic congestive heart failure. checking i's and o's, weights.  10. Hypothyroidism. Synthroid  11. Hyperlipidemia. Zocor  12. History left mastectomy secondary to cancer.  13.  Constipation had bm's over weekend. Senna bid ordered    LOS (Days) 3 A FACE TO FACE EVALUATION WAS PERFORMED  SWARTZ,ZACHARY T 06/22/2012, 7:49 AM

## 2012-06-22 NOTE — Progress Notes (Signed)
Physical Therapy Session Note  Patient Details  Name: [name] MRN: [id number] Date of Birth: 1929/01/21  Today's Date: 06/22/2012 Time: 1015-1058 Time Calculation (min): 43 min  Short Term Goals: Week 1:  PT Short Term Goal 1 (Week 1): = long term goals  Skilled Therapeutic Interventions/Progress Updates:  Treatment session focused on functional mobility in apartment. Patient propelled wheelchair 80 feet x 2 part of the way to the apartment. Patient ambulated with rolling walker 10 feet on carpet to approach regular double bed. Patient sit to supine with supervision. Patient used UE's to lift right LE onto bed. Patient supine to sit with min assist for right LE. Patient ambulated 10 feet to sofa. Patient required min steady assist (after several attempts) for sit to stand from sofa. Patient practiced retrieving items out of upper and lower cabinets while maintaining NWB. Patient stood with RW for upper cabinets and sat on bar stool to reach into lower cabinets. Patient reports that she has stool that she uses in kitchen at home. Patient left sitting in wheelchair in room with all items in reach.  Therapy Documentation Precautions:  Precautions Precautions: Fall Required Braces or Orthoses: Other Brace/Splint Other Brace/Splint: Cast splint on Right lower leg and foot.  Restrictions Weight Bearing Restrictions: Yes RLE Weight Bearing: Non weight bearing Pain: Pain Assessment Pain Assessment: 0-10 Pain Score:   5 Pain Location: Leg Pain Orientation: Right Pain Descriptors: Lambert Mody;Throbbing  Locomotion : Ambulation Ambulation/Gait Assistance: 4: Min guard Wheelchair Mobility Distance: 17'   See FIM for current functional status  Therapy/Group: Individual Therapy  Arelia Longest M 06/22/2012, 11:06 AM

## 2012-06-22 NOTE — Progress Notes (Signed)
[facility] Individual Statement of Services  Patient Name:  [name]  Date:  06/22/2012  Welcome to the [facility].  Our goal is to provide you with an individualized program based on your diagnosis and situation, designed to meet your specific needs.  With this comprehensive rehabilitation program, you will be expected to participate in at least 3 hours of rehabilitation therapies Monday-Friday, with modified therapy programming on the weekends.  Your rehabilitation program will include the following services:  Physical Therapy (PT), Occupational Therapy (OT), 24 hour per day rehabilitation nursing, Therapeutic Recreaction (TR), Case Management ( Social Worker), Rehabilitation Medicine, Nutrition Services and Pharmacy Services  Weekly team conferences will be held on Tuesdays to discuss your progress.  Your Social Worker will talk with you frequently to get your input and to update you on team discussions.  Team conferences with you and your family in attendance may also be held.  Expected length of stay: 7-10 days  Overall anticipated outcome: supervision to                                                                                                                          Modified independent  Depending on your progress and recovery, your program may change. Your Social Worker will coordinate services and will keep you informed of any changes. Your Social Worker's name and contact numbers are listed  below.  The following services may also be recommended but are not provided by the [facility]:   Driving Evaluations  Home Health Rehabiltiation Services  Outpatient Rehabilitatation Servives    Arrangements will be made to provide these services after discharge if needed.  Arrangements include referral to agencies that provide these services.  Your insurance has been verified to be:  Medicare and [employer] Your  primary doctor is:  Rene Paci [facility])  Pertinent information will be shared with your doctor and your insurance company.  Social Worker:  [place], [place] [phone] or ([phone]  Information discussed with and copy given to patient by: Amada Jupiter, 06/22/2012, 3:59 PM

## 2012-06-22 NOTE — Progress Notes (Signed)
Occupational Therapy Session Note  Patient Details  Name: [name] MRN: [id number] Date of Birth: December 18, 1928  Today's Date: 06/22/2012 Time: 0700-0800 Time Calculation (min): 60 min   Skilled Therapeutic Interventions/Progress Updates:    ADL re-training completed at sink level this AM. Pt requested to not take shower this morning. Session with focus on ADL performance, functional transfers, standing balance, and safety awareness. Patient required vc's for technique to increase functional performance during bathing and dressing. Pt given long handled sponge to assist with performance during bathing. Close supervision to contact guard during standing when pulling pants up over hips. Patient took a rest break during standing due to fatigue.  Therapy Documentation Precautions:  Precautions Precautions: Fall Required Braces or Orthoses: Other Brace/Splint Other Brace/Splint: Cast splint on Right lower leg and foot.  Restrictions Weight Bearing Restrictions: Yes RLE Weight Bearing: Non weight bearing Pain: Pain Assessment Pain Assessment: No/denies pain Exercises:   Other Treatments:    See FIM for current functional status  Therapy/Group: Individual Therapy  Limmie Patricia, OTR/L,CBIS   06/22/2012, 7:52 AM

## 2012-06-23 ENCOUNTER — Inpatient Hospital Stay (HOSPITAL_COMMUNITY): Payer: Medicare Other

## 2012-06-23 ENCOUNTER — Inpatient Hospital Stay (HOSPITAL_COMMUNITY): Payer: Medicare Other | Admitting: Occupational Therapy

## 2012-06-23 ENCOUNTER — Encounter (HOSPITAL_COMMUNITY): Payer: Medicare Other | Admitting: Occupational Therapy

## 2012-06-23 ENCOUNTER — Inpatient Hospital Stay (HOSPITAL_COMMUNITY): Payer: Medicare Other | Admitting: Physical Therapy

## 2012-06-23 LAB — GLUCOSE, CAPILLARY

## 2012-06-23 NOTE — Progress Notes (Signed)
Physical Therapy Session Note  Patient Details  Name: [name] MRN: [id number] Date of Birth: 27-Sep-1928  Today's Date: 06/23/2012 Time: 4098-1191 Time Calculation (min): 33 min  Short Term Goals: Week 1:  PT Short Term Goal 1 (Week 1): = long term goals  Skilled Therapeutic Interventions/Progress Updates:   Patient very lethargic; reporting soreness in chest from "gas".  Patient reports receiving medication for pain and patient willing to participate in therapy.  Performed supine > sit with supervision and performed stand pivot bed > w/c with RW and supervision with good adherence to NWB.  Patient reporting her D/C date this Saturday.  Discussed home set up and home entry/exit.  Reports there are 4STE with 2 rails.  Asked patient if she had practiced hopping up steps with UE support on rails; patient reports she will be getting a ramp for home entry/exit but isn't sure if it will be completed by Saturday.  When asked what is alternative for home entry/exit, she reports, "I won't go in!".  Patient then reports that she is sure it will be completed by Saturday but patient unable to report who will be installing ramp.  Patient performed sit <> stand from w/c multiple times with supervision-min A and performed gait training up/down short ramp x 2 reps with UE support on RW and min A with intermittent verbal cues for safety and sequencing with AD during pivoting and tactile cues to maintain upright trunk when descending ramp.  Patient required one sitting rest break secondary to SOB and chest soreness; Sp02 and HR assessed, both WNL.  Discussed with patient that her ramp will be much longer than the practice ramp and recommendation for having family push her up/down the ramp in the w/c.  Patient verbalized agreement.  Returned to room to rest before dinner.    Therapy Documentation Precautions:  Precautions Precautions: Fall Required Braces or Orthoses: Other Brace/Splint Other Brace/Splint:  Cast splint on Right lower leg and foot.  Restrictions Weight Bearing Restrictions: Yes RLE Weight Bearing: Non weight bearing Vital Signs: Therapy Vitals Temp: 99.3 F (37.4 C) Temp src: Oral Pulse Rate: 68 Resp: 18 BP: 112/57 mmHg Patient Position, if appropriate: Lying Oxygen Therapy SpO2: 98 % O2 Device: None (Room air) Pain: Pain Assessment Pain Assessment: No/denies pain  See FIM for current functional status  Therapy/Group: Individual Therapy  Edman Circle [facility] 06/23/2012, 4:35 PM

## 2012-06-23 NOTE — Progress Notes (Signed)
Patient ID: [name], female   DOB: [birth year], 79 y.o.   MRN: [id number] Subjective/Complaints: Some chest pain last night. Seemed to be more reflux than anything. Was better with tums. Feels better this am.   Review of Systems    Musculoskeletal: Positive for joint pain.  All other systems reviewed and are negative.   Objective: Vital Signs: Blood pressure 107/60, pulse 70, temperature 99.7 F (37.6 C), temperature source Oral, resp. rate 18, height 5\' 2"  (1.575 m), weight 70.1 kg (154 lb 8.7 oz), SpO2 93.00%. No results found. Results for orders placed during the hospital encounter of 06/19/12 (from the past 72 hour(s))  GLUCOSE, CAPILLARY     Status: Abnormal   Collection Time    06/20/12 11:32 AM      Result Value Range   Glucose-Capillary 202 (*) 70 - 99 mg/dL   Comment 1 Notify RN    GLUCOSE, CAPILLARY     Status: Abnormal   Collection Time    06/20/12  4:48 PM      Result Value Range   Glucose-Capillary 197 (*) 70 - 99 mg/dL   Comment 1 Notify RN    GLUCOSE, CAPILLARY     Status: Abnormal   Collection Time    06/20/12  9:59 PM      Result Value Range   Glucose-Capillary 129 (*) 70 - 99 mg/dL   Comment 1 Notify RN    GLUCOSE, CAPILLARY     Status: Abnormal   Collection Time    06/21/12  7:22 AM      Result Value Range   Glucose-Capillary 128 (*) 70 - 99 mg/dL   Comment 1 Notify RN    GLUCOSE, CAPILLARY     Status: Abnormal   Collection Time    06/21/12 11:55 AM      Result Value Range   Glucose-Capillary 186 (*) 70 - 99 mg/dL   Comment 1 Notify RN    GLUCOSE, CAPILLARY     Status: Abnormal   Collection Time    06/21/12  4:33 PM      Result Value Range   Glucose-Capillary 173 (*) 70 - 99 mg/dL  GLUCOSE, CAPILLARY     Status: Abnormal   Collection Time    06/21/12  9:28 PM      Result Value Range   Glucose-Capillary 111 (*) 70 - 99 mg/dL  GLUCOSE, CAPILLARY     Status: Abnormal   Collection Time    06/22/12  7:16 AM      Result Value Range   Glucose-Capillary 113 (*) 70 - 99 mg/dL   Comment 1 Notify RN    GLUCOSE, CAPILLARY     Status: Abnormal   Collection Time    06/22/12 11:16 AM      Result Value Range   Glucose-Capillary 162 (*) 70 - 99 mg/dL  CBC     Status: Abnormal   Collection Time    06/22/12  1:00 PM      Result Value Range   WBC 9.5  4.0 - 10.5 K/uL   RBC 3.15 (*) 3.87 - 5.11 MIL/uL   Hemoglobin 9.8 (*) 12.0 - 15.0 g/dL   HCT 82.9 (*) 56.2 - 13.0 %   MCV 95.2  78.0 - 100.0 fL   MCH 31.1  26.0 - 34.0 pg   MCHC 32.7  30.0 - 36.0 g/dL   RDW 86.5  78.4 - 69.6 %   Platelets 341  150 - 400 K/uL  RENAL FUNCTION  PANEL     Status: Abnormal   Collection Time    06/22/12  1:00 PM      Result Value Range   Sodium 134 (*) 135 - 145 mEq/L   Potassium 5.2 (*) 3.5 - 5.1 mEq/L   Chloride 94 (*) 96 - 112 mEq/L   CO2 26  19 - 32 mEq/L   Glucose, Bld 221 (*) 70 - 99 mg/dL   BUN 39 (*) 6 - 23 mg/dL   Creatinine, Ser 9.56 (*) 0.50 - 1.10 mg/dL   Calcium 9.7  8.4 - 21.3 mg/dL   Phosphorus 4.1  2.3 - 4.6 mg/dL   Albumin 2.8 (*) 3.5 - 5.2 g/dL   GFR calc non Af Amer 3 (*) >90 mL/min   GFR calc Af Amer 4 (*) >90 mL/min   Comment:            The eGFR has been calculated     using the CKD EPI equation.     This calculation has not been     validated in all clinical     situations.     eGFR's persistently     <90 mL/min signify     possible Chronic Kidney Disease.  GLUCOSE, CAPILLARY     Status: None   Collection Time    06/22/12  6:32 PM      Result Value Range   Glucose-Capillary 98  70 - 99 mg/dL  GLUCOSE, CAPILLARY     Status: Abnormal   Collection Time    06/22/12  8:27 PM      Result Value Range   Glucose-Capillary 141 (*) 70 - 99 mg/dL  GLUCOSE, CAPILLARY     Status: Abnormal   Collection Time    06/23/12  7:07 AM      Result Value Range   Glucose-Capillary 166 (*) 70 - 99 mg/dL   Comment 1 Notify RN       HEENT: normal Cardio: RRR and no murmurs Resp: CTA B/L and non labored GI: BS positive and  non tender Extremity:  No Edema Skin:   Intact and Other unable to see surgical site RLE due to splint Neuro: Alert/Oriented, Normal Sensory, Abnormal Motor R HF 4/5 unable to check R ankle due to splint and Normal FMC Musc/Skel:  Other R foot and ankle splint fitting well no constriction Gen NAD   Assessment/Plan: 1. Functional deficits secondary to R tibial non union s/p ORIF which require 3+ hours per day of interdisciplinary therapy in a comprehensive inpatient rehab setting. Physiatrist is providing close team supervision and 24 hour management of active medical problems listed below. Physiatrist and rehab team continue to assess barriers to discharge/monitor patient progress toward functional and medical goals. FIM: FIM - Bathing Bathing Steps Patient Completed: Chest;Right Arm;Left Arm;Abdomen;Front perineal area;Buttocks;Right upper leg;Left upper leg;Left lower leg (including foot) Bathing: 5: Set-up assist to: Obtain items  FIM - Upper Body Dressing/Undressing Upper body dressing/undressing steps patient completed: Thread/unthread right bra strap;Thread/unthread left bra strap;Hook/unhook bra;Thread/unthread right sleeve of pullover shirt/dresss;Thread/unthread left sleeve of pullover shirt/dress;Put head through opening of pull over shirt/dress;Pull shirt over trunk Upper body dressing/undressing: 5: Set-up assist to: Obtain clothing/put away FIM - Lower Body Dressing/Undressing Lower body dressing/undressing steps patient completed: Thread/unthread right underwear leg;Thread/unthread left underwear leg;Pull underwear up/down;Thread/unthread left pants leg;Thread/unthread right pants leg;Pull pants up/down;Don/Doff left shoe Lower body dressing/undressing: 4: Steadying Assist  FIM - Toileting Toileting steps completed by patient: Adjust clothing prior to toileting;Performs perineal hygiene Toileting Assistive  Devices: Grab bar or rail for support Toileting: 0: Activity did not  occur  FIM - Diplomatic Services operational officer Devices: Therapist, music Transfers: 0-Activity did not occur  FIM - Banker Devices: Environmental consultant;Arm rests Bed/Chair Transfer: 4: Supine > Sit: Min A (steadying Pt. > 75%/lift 1 leg);5: Bed > Chair or W/C: Supervision (verbal cues/safety issues);5: Sit > Supine: Supervision (verbal cues/safety issues);5: Chair or W/C > Bed: Supervision (verbal cues/safety issues)  FIM - Locomotion: Wheelchair Distance: 80' Locomotion: Wheelchair: 2: Travels 50 - 149 ft with supervision, cueing or coaxing FIM - Locomotion: Ambulation Locomotion: Ambulation Assistive Devices: Designer, industrial/product Ambulation/Gait Assistance: 4: Min guard Locomotion: Ambulation: 1: Travels less than 50 ft with minimal assistance (Pt.>75%)  Comprehension Comprehension Mode: Auditory Comprehension: 7-Follows complex conversation/direction: With no assist  Expression Expression Mode: Verbal Expression: 6-Expresses complex ideas: With extra time/assistive device  Social Interaction Social Interaction: 6-Interacts appropriately with others with medication or extra time (anti-anxiety, antidepressant).  Problem Solving Problem Solving: 6-Solves complex problems: With extra time  Memory Memory: 6-More than reasonable amt of time Medical Problem List and Plan:  1. Right tibia nonunion fracture status post ORIF 06/16/2012  2. DVT Prophylaxis/Anticoagulation: Subcutaneous Lovenox. Monitor platelet counts any signs of bleeding. Check venous dopplers to r/o dvt given nwb and immobilization of right leg.  3. Pain Management: Oxycodone as needed. Monitor with increased mobility  4. Neuropsych: This patient is capable of making decisions on his/her own behalf.  5. End stage renal disease with hemodialysis. Followup per renal services  6. Chronic anemia. Continue Aranesp as directed. Followup CBC with dialysis  7. Hypertension. Norvasc 10 mg  daily, clonidine 0.2 mg twice a day, M.D. or 30 mg daily. Monitor the increased mobility  8. Diabetes mellitus with peripheral neuropathy. Hemoglobin A1c 6.1. Check blood sugars a.c. and at bedtime. Continue sliding scale insulin and Increased NovoLog 70/30 insulin 7 units twice a day. May need further titration. 9. Diastolic congestive heart failure. checking i's and o's, weights.  10. Hypothyroidism. Synthroid  11. Hyperlipidemia. Zocor  12. Chest pain: likely dyspepsia--will follow clinically  -tums and protonix 12. History left mastectomy secondary to cancer.  13.  Constipation had bm's over weekend. Senna bid ordered    LOS (Days) 4 A FACE TO FACE EVALUATION WAS PERFORMED  Chessa Barrasso T 06/23/2012, 7:39 AM

## 2012-06-23 NOTE — Progress Notes (Signed)
Physical Therapy Session Note  Patient Details  Name: MICAL BRUN MRN: [id number] Date of Birth: 12-Jan-1929  Today's Date: 06/23/2012 Time: 0930-1028 Time Calculation (min): 58 min  Short Term Goals: Week 1:  PT Short Term Goal 1 (Week 1): = long term goals  Skilled Therapeutic Interventions/Progress Updates:    Pt reports not sleeping well last night due to indigestion and muscle soreness in chest area. Pt lethargic during session, often falling asleep and needing cues to arouse (RN notified and aware). Focused on transfer techniques for set up and management of w/c parts with and without RW, simulated car transfer with RW with steady A, short distance gait, and supine therex including heel slides, SAQ, LAQ, hip abduction, and modified bridging x 2 sets of 10 reps.   Therapy Documentation Precautions:  Precautions Precautions: Fall Required Braces or Orthoses: Other Brace/Splint Other Brace/Splint: Cast splint on Right lower leg and foot.  Restrictions Weight Bearing Restrictions: Yes RLE Weight Bearing: Non weight bearing  Pain:  c/o soreness in chest (muscular) - premedicated.   See FIM for current functional status  Therapy/Group: Individual Therapy  Karolee Stamps [facility] 06/23/2012, 10:31 AM

## 2012-06-23 NOTE — Progress Notes (Signed)
Subjective:  No current complaints, no dyspnea last night after dialysis.  Objective: Vital signs in last 24 hours: Temp:  [97.4 F (36.3 C)-99.7 F (37.6 C)] 98.8 F (37.1 C) (04/08 0902) Pulse Rate:  [59-93] 70 (04/08 0457) Resp:  [15-18] 18 (04/08 0457) BP: (107-222)/(56-83) 149/74 mmHg (04/08 0902) SpO2:  [93 %-98 %] 93 % (04/08 0457) Weight:  [70.1 kg (154 lb 8.7 oz)-71 kg (156 lb 8.4 oz)] 70.1 kg (154 lb 8.7 oz) (04/07 1706) Weight change:   Intake/Output from previous day: 04/07 0701 - 04/08 0700 In: 240 [P.O.:240] Out: 2435    EXAM: General appearance:  Alert, in no apparent distress Resp:  CTA without rales, rhonchi, or wheezes Cardio:  RRR without murmur or rub GI:  + BS, soft and nontender Extremities:  No edema Access:  AVF @ RUA with + bruit  Lab Results:  Recent Labs  06/22/12 1300  WBC 9.5  HGB 9.8*  HCT 30.0*  PLT 341   BMET:  Recent Labs  06/22/12 1300  NA 134*  K 5.2*  CL 94*  CO2 26  GLUCOSE 221*  BUN 39*  CREATININE 9.70*  CALCIUM 9.7  ALBUMIN 2.8*   No results found for this basename: PTH,  in the last 72 hours Iron Studies: No results found for this basename: IRON, TIBC, TRANSFERRIN, FERRITIN,  in the last 72 hours  Dialysis Orders: Center: [place] - transient who visits frequently from [place] MWF 3.25 hours 2 K 2.25 EDW 68 heparin 2000 Epo 4400 TIW venofer 50/week hectorol 3 TIW  Assessment/Plan: 1. S/p ORIF of right tibia - 4/1 per Dr. Carola Frost after fx in 06/2011, rehab since 4/4.  2. ESRD - HD on MWF, transient from [place], but receives HD at [facility] when visiting; pre-HD K 5.2 yesterday.  HD tomorrow. 3. HTN/Volume - BP 149/74 on Amlodipine 10 mg qd, Clonidine 0.2 mg bid; current wt 70.1 kg s/p net UF 2.4 L with EDW 68. 4. Anemia - Hgb 9.8, Aranesp 40 mcg on Wed.  5. Secondary hyperparathyroidism - Ca 9.7 (10.7 corrected), P 4.1; Hectorol 3 mcg, Phoslo 1 with meals.  6. Nutrition - Alb 2.8, high protein renal diet, vitamin.  7. DM  - on insulin.  8. Hypothyroidism - on Synthroid     LOS: 4 days   LYLES,CHARLES 06/23/2012,9:38 AM  Patient seen and examined.  Agree with assessment and plan as above. Vinson Moselle  MD [phone] pgr    [phone] cell 06/23/2012, 12:15 PM

## 2012-06-23 NOTE — Progress Notes (Signed)
Occupational Therapy Session Note  Patient Details  Name: [name] MRN: [id number] Date of Birth: Dec 24, 1928  Today's Date: 06/23/2012 Time: 0728-0830 Time Calculation (min): Session #1: 62 min Time: 10:36-11:15am Time Calculation (min):  Session #2:  Skilled Therapeutic Interventions/Progress Updates:    Session 1: (07:28--8:30am) Pt seen for individual OT treatment session with focus on ADL retraining at shower level, transfer training to toilet, shower chair and sit-stand at sink for grooming/dressing while maintaining NWB RLE. Pt was Mod A transferring into/out of shower & required VC's for safety and sequencing. Pt appeared to be very lethargic today and needed Mod VC's for completion of tasks and was noted to close her eyes/fall asleep at times during treatment session. See FIM for details. Pt was left in w/c w/ call bell, phone and breakfast tray.  Session 2: (10:36-11:15am) Pt seen for individual OT treatment session with focus on transfer training to toilet and then shower chair w/ min-mod assist & vc's for safety (Pt attempting to sit before toilet/shower chair is behind her and also reaching for grab bars while WB w/ 1 hand on RW & other at grab bar, RW becoming unsteady x3), while maintaining NWB RLE, Pt cont to be very lethargic and was noted to close her eyes while talking. Pt stated "Did you give me something? I'm tired" Pt reports that she didn't sleep well last night. Pt also performed bilateral UE strengthening w/ level 1 theraband/yellow for shoulder flexion, exten and horizontal ABD; 1# weight for biceps, triceps each ex x1 set of 10 each. Pt states chest is sore from propelling w/c "yesterday", no c/o while performing UE strengthening ex's noted. RN staff is aware of this per pt report.  Therapy Documentation Precautions:  Precautions Precautions: Fall Required Braces or Orthoses: Other Brace/Splint Other Brace/Splint: Cast splint on Right lower leg and foot.   Restrictions Weight Bearing Restrictions: Yes RLE Weight Bearing: Non weight bearing   Vital Signs: Therapy Vitals Temp: 98.8 F (37.1 C) BP: 149/74 mmHg Pain: Pt reports chest is sore "from pushing the w/c the other day" RN aware per pt reports.       See FIM for current functional status  Therapy/Group: Individual Therapy  Alm Bustard 06/23/2012, 12:16 PM

## 2012-06-24 ENCOUNTER — Inpatient Hospital Stay (HOSPITAL_COMMUNITY): Payer: Medicare Other | Admitting: Physical Therapy

## 2012-06-24 ENCOUNTER — Encounter (HOSPITAL_COMMUNITY): Payer: Medicare Other | Admitting: Occupational Therapy

## 2012-06-24 ENCOUNTER — Inpatient Hospital Stay (HOSPITAL_COMMUNITY): Payer: Medicare Other | Admitting: Occupational Therapy

## 2012-06-24 ENCOUNTER — Inpatient Hospital Stay (HOSPITAL_COMMUNITY): Payer: Medicare Other

## 2012-06-24 LAB — GLUCOSE, CAPILLARY
Glucose-Capillary: 195 mg/dL — ABNORMAL HIGH (ref 70–99)
Glucose-Capillary: 181 mg/dL — ABNORMAL HIGH (ref 70–99)
Glucose-Capillary: 149 mg/dL — ABNORMAL HIGH (ref 70–99)

## 2012-06-24 LAB — CBC
Hemoglobin: 9.8 g/dL — ABNORMAL LOW (ref 12.0–15.0)
MCH: 31 pg (ref 26.0–34.0)
RBC: 3.16 MIL/uL — ABNORMAL LOW (ref 3.87–5.11)

## 2012-06-24 LAB — RENAL FUNCTION PANEL
CO2: 26 mEq/L (ref 19–32)
GFR calc Af Amer: 4 mL/min — ABNORMAL LOW (ref >90)
Glucose, Bld: 199 mg/dL — ABNORMAL HIGH (ref 70–99)
Potassium: 4.4 mEq/L (ref 3.5–5.1)
Sodium: 132 mEq/L — ABNORMAL LOW (ref 135–145)

## 2012-06-24 MED ORDER — SODIUM CHLORIDE 0.9 % IV SOLN: 100.0000 mL | INTRAVENOUS | Status: DC | PRN

## 2012-06-24 MED ORDER — LIDOCAINE HCL (PF) 1 % IJ SOLN: 5.0000 mL | INTRAMUSCULAR | Status: DC | PRN

## 2012-06-24 MED ORDER — HEPARIN SODIUM (PORCINE) 1000 UNIT/ML DIALYSIS: 1000.0000 [IU] | INTRAMUSCULAR | Status: DC | PRN

## 2012-06-24 MED ORDER — LIDOCAINE-PRILOCAINE 2.5-2.5 % EX CREA
1.0000 "application " | TOPICAL_CREAM | CUTANEOUS | Status: DC | PRN
  Filled 2012-06-24: qty 5

## 2012-06-24 MED ORDER — DOXERCALCIFEROL 4 MCG/2ML IV SOLN
INTRAVENOUS | Status: AC
  Administered 2012-06-24: 3 ug via INTRAVENOUS
  Filled 2012-06-24: qty 2

## 2012-06-24 MED ORDER — NEPRO/CARBSTEADY PO LIQD
237.0000 mL | ORAL | Status: DC | PRN
  Filled 2012-06-24: qty 237

## 2012-06-24 MED ORDER — ALTEPLASE 2 MG IJ SOLR
2.0000 mg | Freq: Once | INTRAMUSCULAR | Status: DC | PRN
  Filled 2012-06-24: qty 2

## 2012-06-24 MED ORDER — METOPROLOL TARTRATE 50 MG PO TABS: 50.0000 mg | ORAL_TABLET | Freq: Four times a day (QID) | ORAL | Status: DC

## 2012-06-24 MED ORDER — DARBEPOETIN ALFA-POLYSORBATE 40 MCG/0.4ML IJ SOLN
INTRAMUSCULAR | Status: AC
  Filled 2012-06-24: qty 0.4

## 2012-06-24 MED ORDER — PENTAFLUOROPROP-TETRAFLUOROETH EX AERO: 1.0000 "application " | INHALATION_SPRAY | CUTANEOUS | Status: DC | PRN

## 2012-06-24 MED ORDER — METOPROLOL TARTRATE 50 MG PO TABS
50.0000 mg | ORAL_TABLET | Freq: Four times a day (QID) | ORAL | Status: DC
  Filled 2012-06-24 (×2): qty 1

## 2012-06-24 MED ORDER — HEPARIN SODIUM (PORCINE) 1000 UNIT/ML DIALYSIS: 20.0000 [IU]/kg | INTRAMUSCULAR | Status: DC | PRN

## 2012-06-24 MED ORDER — ACETAMINOPHEN 500 MG PO TABS
500.0000 mg | ORAL_TABLET | Freq: Three times a day (TID) | ORAL | Status: DC
  Administered 2012-06-24 (×3): 500 mg via ORAL
  Filled 2012-06-24 (×7): qty 1

## 2012-06-24 NOTE — Patient Care Conference (Signed)
Inpatient RehabilitationTeam Conference and Plan of Care Update Date: 06/23/2012   Time: 2:10 PM    Patient Name: [name]      Medical Record Number: [id number]  Date of Birth: 01/28/1929 Sex: Female         Room/Bed: 4009/4009-01 Payor Info: Payor: MEDICARE  Plan: MEDICARE PART A AND B  Product Type: *No Product type*     Admitting Diagnosis: RT TIBIA FX ORIF  NWB  Admit Date/Time:  06/19/2012  3:31 PM Admission Comments: No comment available   Primary Diagnosis:  Right tibial fracture Principal Problem: Right tibial fracture  Patient Active Problem List   Diagnosis Date Noted  . Right tibial fracture 06/21/2012  . Metabolic bone disease 06/19/2012  . Secondary hyperparathyroidism of renal origin 06/19/2012  . Nonunion of fracture, R tibia 06/17/2012  . Hypothyroid   . Tibia/fibula fracture 07/10/2011  . ESRD (end stage renal disease) 07/10/2011  . HTN (hypertension) 07/10/2011  . DM2 (diabetes mellitus, type 2) 07/10/2011  . Hyperlipidemia 07/10/2011  . Anemia 07/10/2011    Expected Discharge Date: Expected Discharge Date: 06/27/12  Team Members Present: Physician leading conference: Dr. Faith Rogue Social Worker Present: Amada Jupiter, LCSW Nurse Present: Daryll Brod, RN PT Present: Karolee Stamps, PT Sherrine Maples, PT) OT Present: Mackie Pai, OT SLP Present: Feliberto Gottron, SLP Other (Discipline and Name): Tora Duck, PPS Coordinator; Bayard Hugger, RN     Current Status/Progress Goal Weekly Team Focus  Medical   right tibial fx, non-healing, ESRD  stabilize medical issues for dc  pain mgt, edema control   Bowel/Bladder   Continent of bowel. LBM 06/21/12. HD-M-W-F Anuria  Pt to remain continent of bowel  Monitor   Swallow/Nutrition/ Hydration             ADL's   Patient is currently Supervision for grooming/bathing/UB dressing; Min Assist for LB dressing. Toilet transfer: Min Assist without grab bars. NWB RLE.   Mod I overall with Supervision for  tub/shower transfer  education, ADL performance, functional transfers, strengthening, standing balance   Mobility   supervision to min A overall transfers, gait, and w/c mobility  mod I w/c level; supervision short distance gait  transfer training, gait, dynamic standing balance, functional strengthening and endurance   Communication             Safety/Cognition/ Behavioral Observations            Pain   Pain managed with prn pain medication  <3  Offer pain medication 1 hr prior to initial therapy session, and HD   Skin   Cast to RLE, CDI  No additional skin breakdown  Assist with turn and repositioning q 2hrs    Rehab Goals Patient on target to meet rehab goals: Yes *See Care Plan and progress notes for long and short-term goals.  Barriers to Discharge: fatigue, pain    Possible Resolutions to Barriers:  pacing, adaptive equipment (wheelchair)    Discharge Planning/Teaching Needs:  Plan to d/c home with daughter here in [place] and daughter will provide any needed support.      Team Discussion:  Doing well overall.  Fatigued from HD day prior.  Anticipate mod i w/c goals.  No concerns at this time.  Revisions to Treatment Plan:  None   Continued Need for Acute Rehabilitation Level of Care: The patient requires daily medical management by a physician with specialized training in physical medicine and rehabilitation for the following conditions: Daily direction of a multidisciplinary physical rehabilitation  program to ensure safe treatment while eliciting the highest outcome that is of practical value to the patient.: Yes Daily medical management of patient stability for increased activity during participation in an intensive rehabilitation regime.: Yes Daily analysis of laboratory values and/or radiology reports with any subsequent need for medication adjustment of medical intervention for : Post surgical problems (ESRD. pain mgt)  Dulcy Sida 06/24/2012, 10:15 AM

## 2012-06-24 NOTE — Progress Notes (Signed)
Occupational Therapy Session Note  Patient Details  Name: [name] MRN: [id number] Date of Birth: 1928/04/03  Today's Date: 06/24/2012 Time: 1478-2956 Time Calculation (min): 45 min    Skilled Therapeutic Interventions/Progress Updates:    Pt participated in dynamic standing balance activities including Ring Toss with Supervision and RW. Pt stood for 2'7"; 2 rounds. Pt needed min vc's for safety when RW became too far in front of patient. Pt also participated in Wii bowling while standing with RW at Supervision level. Pt tolerated standing 4'50" (1st trial) and 2' (2nd trial). One seated rest break needed.  Therapy Documentation Precautions:  Precautions Precautions: Fall Required Braces or Orthoses: Other Brace/Splint Other Brace/Splint: Cast splint on Right lower leg and foot.  Restrictions Weight Bearing Restrictions: Yes RLE Weight Bearing: Non weight bearing Pain: Pain Assessment Pain Assessment: No/denies pain Pain Score:   1 Pain Type: Acute pain Pain Location: Chest Pain Descriptors: Sore Pain Frequency: Intermittent Pain Intervention(s): Medication (See eMAR)  See FIM for current functional status  Therapy/Group: Individual Therapy  Limmie Patricia, OTR/L,CBIS   06/24/2012, 4:07 PM

## 2012-06-24 NOTE — Progress Notes (Signed)
Social Work Patient ID: [name], female   DOB: November 14, 1928, 79 y.o.   MRN: [id number]  Met with patient and daughter yesterday afternoon to review team conference.  Both aware and agreeable with targeted d/c 4/12 at supervision to mod i (w/c) goals.  Discussed follow up - daughter does not feel HHPT will be of any benefit and requests "hold" on doing any therapy until she is allowed to WB.  Will discuss with PT here and make sure no concerns about doing this.  Continue to follow.  Lanis Storlie, LCSW

## 2012-06-24 NOTE — Progress Notes (Signed)
Occupational Therapy Session Note  Patient Details  Name: [name] MRN: [id number] Date of Birth: 1929/01/21  Today's Date: 06/24/2012 Time: 0930-1028 Time Calculation (min): 58 min  Skilled Therapeutic Interventions/Progress Updates:    Pt seen for individual OT treatment session today with focus on ADL retraining, transfer training, activity tolerance, endurance, adherence to RLE NWB precautions during functional activities. Pt was agreeable to sink level ADL today ("I showered yesterday"). Pt was much more alert and required less assist overall for upper and lower body ADL's compared to 06/23/12. Overall SBA/Min A-supervision level w/ occasional vc's for NWB RLE. Mod I grooming while seated. Discussed family education later this week as pt anticipates d/c Sat to dtr's house.   Therapy Documentation Precautions:  Precautions Precautions: Fall Required Braces or Orthoses: Other Brace/Splint Other Brace/Splint: Cast splint on Right lower leg and foot.  Restrictions Weight Bearing Restrictions: Yes RLE Weight Bearing: Non weight bearing     Pain: Pain Assessment Pain Score:   4 Pt cont to report chest soreness, RN aware and pt states that she has tylenol prior to treatment session. ADL: ADL Grooming: Setup Where Assessed-Grooming: Wheelchair Upper Body Bathing: Setup Where Assessed-Upper Body Bathing: Sitting at sink Lower Body Bathing: Minimal assistance Where Assessed-Lower Body Bathing: Standing at sink;Sitting at sink;Wheelchair Upper Body Dressing: Setup Where Assessed-Upper Body Dressing: Sitting at sink Lower Body Dressing: Moderate assistance Where Assessed-Lower Body Dressing: Standing at sink;Sitting at sink;Wheelchair Toileting: Not assessed Where Assessed-Toileting: Teacher, adult education: Curator Method: Stand Audiological scientist Method: Stand pivot Tub/Shower Equipment: Transfer tub bench Exercises: Verbally reviewed UE  theraband ex's w/ pt.     See FIM for current functional status  Therapy/Group: Individual Therapy  Alm Bustard 06/24/2012, 1:17 PM

## 2012-06-24 NOTE — Progress Notes (Signed)
Physical Therapy Note  Patient Details  Name: [name] MRN: [id number] Date of Birth: [birth year] Today's Date: 06/24/2012  Time: 1330-1425 55 minutes  Pt c/o pain in mm in chest, RN made aware.  Gait training in household environment with supervision, 1 LOB requiring min steady assist to correct.  Furniture transfers SPT with RW to various height surfaces with supervision - min A.  Supine therex for R LE strength and endurance 2 x 12.  Standing balance training with horseshoe toss with 1 UE support, 1 LOB which pt was able to self correct using RW.  Gait training 4 x 30' with improved foot clearance, pt able to maintain NWB throughout.  Individual therapy    Juvon Teater 06/24/2012, 2:27 PM

## 2012-06-24 NOTE — Progress Notes (Signed)
Patient with tachycardia since HD. Complaints of malaise. EKG with a-fib with HR 140. Spoke with [employer] Cards on call who recommends Lopressor 50 mg q 6 hours. If HR doesn't come down to 110s in an hour or if patient symptomatic will need transfer to tele. Cardiac enzymes ordered. Nurse advised to have RR nurse to check on patient. To check vitals every 15 mins x 1 hour then every hour x 2. To call back if symptom don't improve.

## 2012-06-24 NOTE — Progress Notes (Signed)
Inpatient Diabetes Program Recommendations  AACE/ADA: New Consensus Statement on Inpatient Glycemic Control (2013)  Target Ranges:  Prepandial:   less than 140 mg/dL      Peak postprandial:   less than 180 mg/dL (1-2 hours)      Critically ill patients:  140 - 180 mg/dL    Noted that correction scale is ordered only for HS. Pt on set dose of 70/30 which is not always effective in control. Might consider ordering sensitive correction tidwc as well.  Thank you, Lenor Coffin, RN, CNS, Diabetes Coordinator [phone])

## 2012-06-24 NOTE — Progress Notes (Signed)
Patient ID: [name], female   DOB: September 12, 1928, 79 y.o.   MRN: [id number] Subjective/Complaints: Chest still sore. Has been active with PT.   Review of Systems    Musculoskeletal: Positive for joint pain.  All other systems reviewed and are negative.   Objective: Vital Signs: Blood pressure 135/45, pulse 67, temperature 98.3 F (36.8 C), temperature source Oral, resp. rate 20, height 5\' 2"  (1.575 m), weight 70.1 kg (154 lb 8.7 oz), SpO2 94.00%. No results found. Results for orders placed during the hospital encounter of 06/19/12 (from the past 72 hour(s))  GLUCOSE, CAPILLARY     Status: Abnormal   Collection Time    06/21/12 11:55 AM      Result Value Range   Glucose-Capillary 186 (*) 70 - 99 mg/dL   Comment 1 Notify RN    GLUCOSE, CAPILLARY     Status: Abnormal   Collection Time    06/21/12  4:33 PM      Result Value Range   Glucose-Capillary 173 (*) 70 - 99 mg/dL  GLUCOSE, CAPILLARY     Status: Abnormal   Collection Time    06/21/12  9:28 PM      Result Value Range   Glucose-Capillary 111 (*) 70 - 99 mg/dL  GLUCOSE, CAPILLARY     Status: Abnormal   Collection Time    06/22/12  7:16 AM      Result Value Range   Glucose-Capillary 113 (*) 70 - 99 mg/dL   Comment 1 Notify RN    GLUCOSE, CAPILLARY     Status: Abnormal   Collection Time    06/22/12 11:16 AM      Result Value Range   Glucose-Capillary 162 (*) 70 - 99 mg/dL  CBC     Status: Abnormal   Collection Time    06/22/12  1:00 PM      Result Value Range   WBC 9.5  4.0 - 10.5 K/uL   RBC 3.15 (*) 3.87 - 5.11 MIL/uL   Hemoglobin 9.8 (*) 12.0 - 15.0 g/dL   HCT 95.6 (*) 38.7 - 56.4 %   MCV 95.2  78.0 - 100.0 fL   MCH 31.1  26.0 - 34.0 pg   MCHC 32.7  30.0 - 36.0 g/dL   RDW 33.2  95.1 - 88.4 %   Platelets 341  150 - 400 K/uL  RENAL FUNCTION PANEL     Status: Abnormal   Collection Time    06/22/12  1:00 PM      Result Value Range   Sodium 134 (*) 135 - 145 mEq/L   Potassium 5.2 (*) 3.5 - 5.1 mEq/L   Chloride 94 (*) 96 - 112 mEq/L   CO2 26  19 - 32 mEq/L   Glucose, Bld 221 (*) 70 - 99 mg/dL   BUN 39 (*) 6 - 23 mg/dL   Creatinine, Ser 1.66 (*) 0.50 - 1.10 mg/dL   Calcium 9.7  8.4 - 06.3 mg/dL   Phosphorus 4.1  2.3 - 4.6 mg/dL   Albumin 2.8 (*) 3.5 - 5.2 g/dL   GFR calc non Af Amer 3 (*) >90 mL/min   GFR calc Af Amer 4 (*) >90 mL/min   Comment:            The eGFR has been calculated     using the CKD EPI equation.     This calculation has not been     validated in all clinical     situations.  eGFR's persistently     <90 mL/min signify     possible Chronic Kidney Disease.  GLUCOSE, CAPILLARY     Status: None   Collection Time    06/22/12  6:32 PM      Result Value Range   Glucose-Capillary 98  70 - 99 mg/dL  GLUCOSE, CAPILLARY     Status: Abnormal   Collection Time    06/22/12  8:27 PM      Result Value Range   Glucose-Capillary 141 (*) 70 - 99 mg/dL  GLUCOSE, CAPILLARY     Status: Abnormal   Collection Time    06/23/12  7:07 AM      Result Value Range   Glucose-Capillary 166 (*) 70 - 99 mg/dL   Comment 1 Notify RN    GLUCOSE, CAPILLARY     Status: Abnormal   Collection Time    06/23/12 10:59 AM      Result Value Range   Glucose-Capillary 207 (*) 70 - 99 mg/dL   Comment 1 Notify RN    GLUCOSE, CAPILLARY     Status: Abnormal   Collection Time    06/23/12  4:43 PM      Result Value Range   Glucose-Capillary 170 (*) 70 - 99 mg/dL  GLUCOSE, CAPILLARY     Status: Abnormal   Collection Time    06/23/12  9:06 PM      Result Value Range   Glucose-Capillary 243 (*) 70 - 99 mg/dL     HEENT: normal Cardio: RRR and no murmurs Resp: CTA B/L and non labored GI: BS positive and non tender Extremity:  No Edema Skin:   Intact and Other unable to see surgical site RLE due to splint Neuro: Alert/Oriented, Normal Sensory, Abnormal Motor R HF 4/5 unable to check R ankle due to splint and Normal FMC Musc/Skel:  Other R foot and ankle splint fitting well no constriction.  Chest wall is slightly tender anteriorly to touch Gen NAD   Assessment/Plan: 1. Functional deficits secondary to R tibial non union s/p ORIF which require 3+ hours per day of interdisciplinary therapy in a comprehensive inpatient rehab setting. Physiatrist is providing close team supervision and 24 hour management of active medical problems listed below. Physiatrist and rehab team continue to assess barriers to discharge/monitor patient progress toward functional and medical goals. FIM: FIM - Bathing Bathing Steps Patient Completed: Chest;Right Arm;Left Arm;Abdomen;Front perineal area;Right upper leg;Left upper leg;Left lower leg (including foot) Bathing: 4: Min-Patient completes 8-9 49f 10 parts or 75+ percent  FIM - Upper Body Dressing/Undressing Upper body dressing/undressing steps patient completed: Thread/unthread right bra strap;Thread/unthread left bra strap;Thread/unthread right sleeve of pullover shirt/dresss;Thread/unthread left sleeve of pullover shirt/dress;Put head through opening of pull over shirt/dress;Pull shirt over trunk Upper body dressing/undressing: 5: Set-up assist to: Obtain clothing/put away FIM - Lower Body Dressing/Undressing Lower body dressing/undressing steps patient completed: Thread/unthread right underwear leg;Thread/unthread left underwear leg;Pull underwear up/down;Thread/unthread right pants leg;Thread/unthread left pants leg;Pull pants up/down;Don/Doff left sock;Don/Doff left shoe Lower body dressing/undressing: 4: Min-Patient completed 75 plus % of tasks  FIM - Toileting Toileting steps completed by patient: Adjust clothing prior to toileting;Performs perineal hygiene Toileting Assistive Devices: Grab bar or rail for support Toileting: 3: Mod-Patient completed 2 of 3 steps  FIM - Diplomatic Services operational officer Devices: Grab bars Toilet Transfers: 3-To toilet/BSC: Mod A (lift or lower assist)  FIM - Landscape architect Devices: Arm rests Bed/Chair Transfer: 6: Supine > Sit: No assist;4: Bed >  Chair or W/C: Min A (steadying Pt. > 75%)  FIM - Locomotion: Wheelchair Distance: 80' Locomotion: Wheelchair: 2: Travels 50 - 149 ft with supervision, cueing or coaxing FIM - Locomotion: Ambulation Locomotion: Ambulation Assistive Devices: Designer, industrial/product Ambulation/Gait Assistance: 4: Min guard Locomotion: Ambulation: 1: Travels less than 50 ft with minimal assistance (Pt.>75%)  Comprehension Comprehension Mode: Auditory Comprehension: 7-Follows complex conversation/direction: With no assist  Expression Expression Mode: Verbal Expression: 6-Expresses complex ideas: With extra time/assistive device  Social Interaction Social Interaction: 6-Interacts appropriately with others with medication or extra time (anti-anxiety, antidepressant).  Problem Solving Problem Solving: 6-Solves complex problems: With extra time  Memory Memory: 6-More than reasonable amt of time Medical Problem List and Plan:  1. Right tibia nonunion fracture status post ORIF 06/16/2012  2. DVT Prophylaxis/Anticoagulation: Subcutaneous Lovenox. Monitor platelet counts any signs of bleeding. Check venous dopplers to r/o dvt given nwb and immobilization of right leg.  3. Pain Management: Oxycodone as needed. Monitor with increased mobility   -advised the use of heat, ice, tylenol for musculoskeletal chest wall pain 4. Neuropsych: This patient is capable of making decisions on his/her own behalf.  5. End stage renal disease with hemodialysis. Followup per renal services  6. Chronic anemia. Continue Aranesp as directed. Followup CBC with dialysis  7. Hypertension. Norvasc 10 mg daily, clonidine 0.2 mg twice a day, M.D. or 30 mg daily. Monitor the increased mobility  8. Diabetes mellitus with peripheral neuropathy. Hemoglobin A1c 6.1. Check blood sugars a.c. and at bedtime. Continue sliding scale insulin and Increased NovoLog 70/30  insulin 7 units twice a day. May need further titration. 9. Diastolic congestive heart failure. checking i's and o's, weights.  10. Hypothyroidism. Synthroid  11. Hyperlipidemia. Zocor  12. Dyspepsia:  -tums and protonix 12. History left mastectomy secondary to cancer.  13.  Constipation: senokot s    LOS (Days) 5 A FACE TO FACE EVALUATION WAS PERFORMED  Ranelle Oyster, MD, [facility]

## 2012-06-25 ENCOUNTER — Inpatient Hospital Stay (HOSPITAL_COMMUNITY): Payer: Medicare Other | Admitting: Occupational Therapy

## 2012-06-25 ENCOUNTER — Encounter (HOSPITAL_COMMUNITY): Payer: Self-pay | Admitting: Internal Medicine

## 2012-06-25 ENCOUNTER — Inpatient Hospital Stay (HOSPITAL_COMMUNITY): Payer: Medicare Other

## 2012-06-25 ENCOUNTER — Encounter (HOSPITAL_COMMUNITY): Payer: Medicare Other | Admitting: Occupational Therapy

## 2012-06-25 ENCOUNTER — Inpatient Hospital Stay (HOSPITAL_COMMUNITY)
Admission: AD | Admit: 2012-06-25 | Discharge: 2012-06-26 | DRG: 308 | Disposition: A | Discharge status: Home Health | Payer: Medicare Other | Source: Other Acute Inpatient Hospital | Attending: Internal Medicine | Admitting: Internal Medicine

## 2012-06-25 DIAGNOSIS — N186 End stage renal disease: Secondary | ICD-10-CM | POA: Diagnosis not present

## 2012-06-25 DIAGNOSIS — N2581 Secondary hyperparathyroidism of renal origin: Secondary | ICD-10-CM | POA: Diagnosis not present

## 2012-06-25 DIAGNOSIS — Z992 Dependence on renal dialysis: Secondary | ICD-10-CM

## 2012-06-25 DIAGNOSIS — S82209A Unspecified fracture of shaft of unspecified tibia, initial encounter for closed fracture: Secondary | ICD-10-CM

## 2012-06-25 DIAGNOSIS — E1149 Type 2 diabetes mellitus with other diabetic neurological complication: Secondary | ICD-10-CM | POA: Diagnosis present

## 2012-06-25 DIAGNOSIS — D649 Anemia, unspecified: Secondary | ICD-10-CM

## 2012-06-25 DIAGNOSIS — I5032 Chronic diastolic (congestive) heart failure: Secondary | ICD-10-CM | POA: Diagnosis present

## 2012-06-25 DIAGNOSIS — Z7982 Long term (current) use of aspirin: Secondary | ICD-10-CM

## 2012-06-25 DIAGNOSIS — E039 Hypothyroidism, unspecified: Secondary | ICD-10-CM | POA: Diagnosis present

## 2012-06-25 DIAGNOSIS — I959 Hypotension, unspecified: Secondary | ICD-10-CM | POA: Diagnosis present

## 2012-06-25 DIAGNOSIS — I10 Essential (primary) hypertension: Secondary | ICD-10-CM

## 2012-06-25 DIAGNOSIS — I4891 Unspecified atrial fibrillation: Principal | ICD-10-CM | POA: Diagnosis present

## 2012-06-25 DIAGNOSIS — S82201A Unspecified fracture of shaft of right tibia, initial encounter for closed fracture: Secondary | ICD-10-CM

## 2012-06-25 DIAGNOSIS — M129 Arthropathy, unspecified: Secondary | ICD-10-CM | POA: Diagnosis present

## 2012-06-25 DIAGNOSIS — M898X9 Other specified disorders of bone, unspecified site: Secondary | ICD-10-CM

## 2012-06-25 DIAGNOSIS — S82409A Unspecified fracture of shaft of unspecified fibula, initial encounter for closed fracture: Secondary | ICD-10-CM

## 2012-06-25 DIAGNOSIS — Z79899 Other long term (current) drug therapy: Secondary | ICD-10-CM

## 2012-06-25 DIAGNOSIS — E889 Metabolic disorder, unspecified: Secondary | ICD-10-CM

## 2012-06-25 DIAGNOSIS — E1142 Type 2 diabetes mellitus with diabetic polyneuropathy: Secondary | ICD-10-CM | POA: Diagnosis present

## 2012-06-25 DIAGNOSIS — IMO0001 Reserved for inherently not codable concepts without codable children: Secondary | ICD-10-CM | POA: Diagnosis not present

## 2012-06-25 DIAGNOSIS — Z923 Personal history of irradiation: Secondary | ICD-10-CM | POA: Diagnosis present

## 2012-06-25 DIAGNOSIS — I43 Cardiomyopathy in diseases classified elsewhere: Secondary | ICD-10-CM | POA: Diagnosis present

## 2012-06-25 DIAGNOSIS — Z981 Arthrodesis status: Secondary | ICD-10-CM

## 2012-06-25 DIAGNOSIS — Z853 Personal history of malignant neoplasm of breast: Secondary | ICD-10-CM

## 2012-06-25 DIAGNOSIS — Z794 Long term (current) use of insulin: Secondary | ICD-10-CM

## 2012-06-25 DIAGNOSIS — E119 Type 2 diabetes mellitus without complications: Secondary | ICD-10-CM

## 2012-06-25 DIAGNOSIS — D631 Anemia in chronic kidney disease: Secondary | ICD-10-CM | POA: Diagnosis present

## 2012-06-25 DIAGNOSIS — E785 Hyperlipidemia, unspecified: Secondary | ICD-10-CM | POA: Diagnosis present

## 2012-06-25 DIAGNOSIS — I509 Heart failure, unspecified: Secondary | ICD-10-CM | POA: Diagnosis present

## 2012-06-25 DIAGNOSIS — IMO0002 Reserved for concepts with insufficient information to code with codable children: Secondary | ICD-10-CM

## 2012-06-25 DIAGNOSIS — S82201G Unspecified fracture of shaft of right tibia, subsequent encounter for closed fracture with delayed healing: Secondary | ICD-10-CM

## 2012-06-25 HISTORY — DX: Unspecified atrial fibrillation: I48.91

## 2012-06-25 LAB — GLUCOSE, CAPILLARY
Glucose-Capillary: 140 mg/dL — ABNORMAL HIGH (ref 70–99)
Glucose-Capillary: 140 mg/dL — ABNORMAL HIGH (ref 70–99)
Glucose-Capillary: 158 mg/dL — ABNORMAL HIGH (ref 70–99)

## 2012-06-25 LAB — CBC
MCH: 30.7 pg (ref 26.0–34.0)
Platelets: 334 10*3/uL (ref 150–400)
RBC: 3.26 MIL/uL — ABNORMAL LOW (ref 3.87–5.11)
WBC: 11.7 10*3/uL — ABNORMAL HIGH (ref 4.0–10.5)

## 2012-06-25 MED ORDER — WARFARIN - PHARMACIST DOSING INPATIENT: Freq: Every day | Status: DC

## 2012-06-25 MED ORDER — ONDANSETRON HCL 4 MG/2ML IJ SOLN: 4.0000 mg | Freq: Four times a day (QID) | INTRAMUSCULAR | Status: DC | PRN

## 2012-06-25 MED ORDER — SODIUM CHLORIDE 0.9 % IJ SOLN: 3.0000 mL | INTRAMUSCULAR | Status: DC | PRN

## 2012-06-25 MED ORDER — DOCUSATE SODIUM 100 MG PO CAPS
100.0000 mg | ORAL_CAPSULE | Freq: Two times a day (BID) | ORAL | Status: DC
  Administered 2012-06-25 – 2012-06-26 (×3): 100 mg via ORAL
  Filled 2012-06-25 (×4): qty 1

## 2012-06-25 MED ORDER — WARFARIN SODIUM 5 MG PO TABS
5.0000 mg | ORAL_TABLET | Freq: Once | ORAL | Status: AC
  Administered 2012-06-25: 5 mg via ORAL
  Filled 2012-06-25: qty 1

## 2012-06-25 MED ORDER — HEPARIN (PORCINE) IN NACL 100-0.45 UNIT/ML-% IJ SOLN
900.0000 [IU]/h | INTRAMUSCULAR | Status: DC
  Administered 2012-06-25: 900 [IU]/h via INTRAVENOUS
  Filled 2012-06-25: qty 250

## 2012-06-25 MED ORDER — METOPROLOL TARTRATE 50 MG PO TABS
50.0000 mg | ORAL_TABLET | Freq: Four times a day (QID) | ORAL | Status: DC
  Administered 2012-06-25: 50 mg via ORAL
  Filled 2012-06-25 (×6): qty 1

## 2012-06-25 MED ORDER — SENNA 8.6 MG PO TABS
1.0000 | ORAL_TABLET | Freq: Two times a day (BID) | ORAL | Status: DC
  Administered 2012-06-25 – 2012-06-26 (×2): 8.6 mg via ORAL
  Filled 2012-06-25 (×4): qty 1

## 2012-06-25 MED ORDER — ACETAMINOPHEN 650 MG RE SUPP: 650.0000 mg | Freq: Four times a day (QID) | RECTAL | Status: DC | PRN

## 2012-06-25 MED ORDER — ACETAMINOPHEN 325 MG PO TABS: 650.0000 mg | ORAL_TABLET | Freq: Four times a day (QID) | ORAL | Status: DC | PRN

## 2012-06-25 MED ORDER — SODIUM CHLORIDE 0.9 % IV SOLN: 250.0000 mL | INTRAVENOUS | Status: DC | PRN

## 2012-06-25 MED ORDER — SODIUM CHLORIDE 0.9 % IV SOLN
INTRAVENOUS | Status: DC
  Administered 2012-06-25: 02:00:00 via INTRAVENOUS

## 2012-06-25 MED ORDER — SIMVASTATIN 10 MG PO TABS
10.0000 mg | ORAL_TABLET | Freq: Every day | ORAL | Status: DC
  Administered 2012-06-25: 10 mg via ORAL
  Filled 2012-06-25 (×2): qty 1

## 2012-06-25 MED ORDER — SODIUM CHLORIDE 0.9 % IV BOLUS (SEPSIS)
250.0000 mL | Freq: Once | INTRAVENOUS | Status: AC
  Administered 2012-06-25: 250 mL via INTRAVENOUS

## 2012-06-25 MED ORDER — INSULIN ASPART 100 UNIT/ML ~~LOC~~ SOLN
0.0000 [IU] | SUBCUTANEOUS | Status: DC
  Administered 2012-06-25 (×2): 1 [IU] via SUBCUTANEOUS
  Administered 2012-06-25 – 2012-06-26 (×4): 2 [IU] via SUBCUTANEOUS

## 2012-06-25 MED ORDER — COUMADIN BOOK
Freq: Once | Status: DC
  Filled 2012-06-25: qty 1

## 2012-06-25 MED ORDER — ASPIRIN EC 81 MG PO TBEC
81.0000 mg | DELAYED_RELEASE_TABLET | Freq: Every day | ORAL | Status: DC
  Administered 2012-06-25 – 2012-06-26 (×2): 81 mg via ORAL
  Filled 2012-06-25 (×2): qty 1

## 2012-06-25 MED ORDER — INSULIN GLARGINE 100 UNIT/ML ~~LOC~~ SOLN
5.0000 [IU] | Freq: Every day | SUBCUTANEOUS | Status: DC
  Administered 2012-06-25: 5 [IU] via SUBCUTANEOUS
  Filled 2012-06-25 (×3): qty 0.05

## 2012-06-25 MED ORDER — LEVOTHYROXINE SODIUM 50 MCG PO TABS
50.0000 ug | ORAL_TABLET | Freq: Every day | ORAL | Status: DC
  Administered 2012-06-25 – 2012-06-26 (×2): 50 ug via ORAL
  Filled 2012-06-25 (×4): qty 1

## 2012-06-25 MED ORDER — SODIUM CHLORIDE 0.9 % IJ SOLN
3.0000 mL | Freq: Two times a day (BID) | INTRAMUSCULAR | Status: DC
  Administered 2012-06-25 – 2012-06-26 (×2): 3 mL via INTRAVENOUS

## 2012-06-25 MED ORDER — METOPROLOL TARTRATE 25 MG PO TABS
25.0000 mg | ORAL_TABLET | Freq: Four times a day (QID) | ORAL | Status: DC
  Administered 2012-06-25 – 2012-06-26 (×2): 25 mg via ORAL
  Filled 2012-06-25 (×10): qty 1

## 2012-06-25 MED ORDER — WARFARIN VIDEO: Freq: Once | Status: DC

## 2012-06-25 MED ORDER — OXYCODONE HCL 5 MG PO TABS: 5.0000 mg | ORAL_TABLET | ORAL | Status: DC | PRN

## 2012-06-25 MED ORDER — PANTOPRAZOLE SODIUM 40 MG PO TBEC
40.0000 mg | DELAYED_RELEASE_TABLET | Freq: Every day | ORAL | Status: DC
  Administered 2012-06-25 – 2012-06-26 (×2): 40 mg via ORAL
  Filled 2012-06-25 (×2): qty 1

## 2012-06-25 MED ORDER — ONDANSETRON HCL 4 MG PO TABS: 4.0000 mg | ORAL_TABLET | Freq: Four times a day (QID) | ORAL | Status: DC | PRN

## 2012-06-25 MED ORDER — SODIUM CHLORIDE 0.9 % IJ SOLN
3.0000 mL | Freq: Two times a day (BID) | INTRAMUSCULAR | Status: DC
  Administered 2012-06-25 (×2): 3 mL via INTRAVENOUS

## 2012-06-25 MED ORDER — SODIUM CHLORIDE 0.9 % IJ SOLN: 3.0000 mL | Freq: Two times a day (BID) | INTRAMUSCULAR | Status: DC

## 2012-06-25 MED ORDER — CALCIUM ACETATE 667 MG PO CAPS
667.0000 mg | ORAL_CAPSULE | Freq: Three times a day (TID) | ORAL | Status: DC
  Administered 2012-06-25 – 2012-06-26 (×4): 667 mg via ORAL
  Filled 2012-06-25 (×8): qty 1

## 2012-06-25 NOTE — Discharge Summary (Signed)
  Discharge summary job # [id number]

## 2012-06-25 NOTE — Progress Notes (Signed)
[facility] PROGRESS NOTE  [name] WGN:[id number] DOB: [birth year] DOA: 06/25/2012 PCP: Rene Paci, MD  Assessment/Plan: 1. Afib with RVR - new onset. - would attempt BB - metoprolol 25 q 6 with holding parameters to try to rate control. CHADS2 score is 2. Etiology is probably hypertensive cardiomyopathy. SE[facility] to consult. 2. ESRD - HD M,W,F 3. Anemia of CKD 4. DM 2 - Lantus and SSI 5. Tib fracture - s/p surgery , resume rehab     Code Status: full  Family Communication: daughter Nelida Gores - works at [employer] as a Sports coach [phone] Disposition Plan: home vs back to [facility]   Consultants:  [facility]  Procedures:    HPI/Subjective: No chest pain today   Objective: Filed Vitals:   06/25/12 0530 06/25/12 0600 06/25/12 0630 06/25/12 0700  BP: 90/54 106/56 91/45 98/52   Pulse: 121 88 104 103  Temp:      TempSrc:      Resp: 17 20 17 18   Height:      Weight:      SpO2: 100% 100% 100% 99%   Patient Vitals for the past 24 hrs:  BP Temp Temp src Pulse Resp SpO2 Height Weight  06/25/12 0700 98/52 mmHg - - 103 18 99 % - -  06/25/12 0630 91/45 mmHg - - 104 17 100 % - -  06/25/12 0600 106/56 mmHg - - 88 20 100 % - -  06/25/12 0530 90/54 mmHg - - 121 17 100 % - -  06/25/12 0515 101/45 mmHg - - 115 16 100 % - -  06/25/12 0500 92/51 mmHg - - 129 17 100 % - -  06/25/12 0455 96/45 mmHg - - 114 20 99 % - -  06/25/12 0445 - - - 105 19 100 % - -  06/25/12 0430 79/46 mmHg - - 115 18 100 % - -  06/25/12 0417 93/45 mmHg - - 106 16 100 % - -  06/25/12 0415 83/47 mmHg - - 127 18 100 % - -  06/25/12 0400 82/31 mmHg - - 125 18 100 % - -  06/25/12 0348 83/44 mmHg - - 80 19 100 % - -  06/25/12 0330 107/35 mmHg 98.6 F (37 C) Oral 128 18 100 % 5\' 2"  (1.575 m) 66.2 kg (145 lb 15.1 oz)     Intake/Output Summary (Last 24 hours) at 06/25/12 0734 Last data filed at 06/25/12 0600  Gross per 24 hour  Intake    275 ml  Output      0 ml  Net    275 ml   Filed Weights   06/25/12  0330  Weight: 66.2 kg (145 lb 15.1 oz)    Exam:   General:  axox3  Cardiovascular: irreg irreg   Respiratory: ctab   Abdomen: soft, nt   Musculoskeletal: cast right leg    Data Reviewed: Basic Metabolic Panel:  Recent Labs Lab 06/22/12 1300 06/24/12 1724  NA 134* 132*  K 5.2* 4.4  CL 94* 91*  CO2 26 26  GLUCOSE 221* 199*  BUN 39* 45*  CREATININE 9.70* 8.68*  CALCIUM 9.7 9.9  PHOS 4.1 3.6   Liver Function Tests:  Recent Labs Lab 06/22/12 1300 06/24/12 1724  ALBUMIN 2.8* 2.8*   No results found for this basename: LIPASE, AMYLASE,  in the last 168 hours No results found for this basename: AMMONIA,  in the last 168 hours CBC:  Recent Labs Lab 06/19/12 0810 06/22/12 1300 06/24/12 1724 06/25/12 0540 06/25/12  0650  WBC 12.5* 9.5 17.2* QUESTIONABLE RESULTS, RECOMMEND RECOLLECT TO VERIFY 11.7*  NEUTROABS 9.7*  --   --   --   --   HGB 10.0* 9.8* 9.8* QUESTIONABLE RESULTS, RECOMMEND RECOLLECT TO VERIFY 10.0*  HCT 31.2* 30.0* 30.0* QUESTIONABLE RESULTS, RECOMMEND RECOLLECT TO VERIFY 31.3*  MCV 98.1 95.2 94.9 QUESTIONABLE RESULTS, RECOMMEND RECOLLECT TO VERIFY 96.0  PLT 287 341 377 QUESTIONABLE RESULTS, RECOMMEND RECOLLECT TO VERIFY 334   Cardiac Enzymes:  Recent Labs Lab 06/24/12 2358 06/25/12 0540  TROPONINI <0.30 <0.30   BNP (last 3 results) No results found for this basename: PROBNP,  in the last 8760 hours CBG:  Recent Labs Lab 06/24/12 0719 06/24/12 1136 06/24/12 1633 06/24/12 2159 06/25/12 0150  GLUCAP 281* 195* 181* 149* 168*    Recent Results (from the past 240 hour(s))  SURGICAL PCR SCREEN     Status: Abnormal   Collection Time    06/16/12  6:40 AM      Result Value Range Status   MRSA, PCR NEGATIVE  NEGATIVE Final   Staphylococcus aureus POSITIVE (*) NEGATIVE Final   Comment:            The Xpert SA Assay (FDA     approved for NASAL specimens     in patients over 84 years of age),     is one component of     a comprehensive  surveillance     program.  Test performance has     been validated by [employer] for patients greater     than or equal to 19 year old.     It is not intended     to diagnose infection nor to     guide or monitor treatment.  URINE CULTURE     Status: None   Collection Time    06/16/12  7:20 AM      Result Value Range Status   Specimen Description URINE, CLEAN CATCH   Final   Special Requests NONE   Final   Culture  Setup Time 06/16/2012 14:21   Final   Colony Count NO GROWTH   Final   Culture NO GROWTH   Final   Report Status 06/17/2012 FINAL   Final  ANAEROBIC CULTURE     Status: None   Collection Time    06/16/12  9:33 AM      Result Value Range Status   Specimen Description TISSUE RIGHT TIBIA   Final   Special Requests DISTAL NON UNION EXCAVAGATION   Final   Gram Stain     Final   Value: RARE WBC PRESENT, PREDOMINANTLY PMN     NO ORGANISMS SEEN     Performed at [facility]   Culture NO ANAEROBES ISOLATED   Final   Report Status 06/21/2012 FINAL   Final  TISSUE CULTURE     Status: None   Collection Time    06/16/12  9:33 AM      Result Value Range Status   Specimen Description TISSUE RIGHT TIBIA   Final   Special Requests DISTAL NON UNION EXCAVAGATION   Final   Gram Stain     Final   Value: RARE WBC PRESENT, PREDOMINANTLY MONONUCLEAR     NO ORGANISMS SEEN     Performed at [facility]   Culture NO GROWTH 3 DAYS   Final   Report Status 06/19/2012 FINAL   Final  GRAM STAIN     Status: None  Collection Time    06/16/12  9:33 AM      Result Value Range Status   Specimen Description TISSUE RIGHT TIBIA   Final   Special Requests DISTAL NON UNION EXCAVAGATION   Final   Gram Stain     Final   Value: RARE WBC PRESENT, PREDOMINANTLY MONONUCLEAR     NO ORGANISMS SEEN   Report Status 06/16/2012 FINAL   Final     Studies: No results found.  Scheduled Meds: . aspirin EC  81 mg Oral Daily  . calcium acetate  667 mg Oral TID WC  . docusate sodium   100 mg Oral BID  . insulin aspart  0-9 Units Subcutaneous Q4H  . insulin glargine  5 Units Subcutaneous QHS  . levothyroxine  50 mcg Oral QAC breakfast  . pantoprazole  40 mg Oral Q1200  . senna  1 tablet Oral BID  . simvastatin  10 mg Oral QHS  . sodium chloride  3 mL Intravenous Q12H  . sodium chloride  3 mL Intravenous Q12H   Continuous Infusions: . heparin 900 Units/hr (06/25/12 0556)    Active Problems:   Tibia/fibula fracture   ESRD (end stage renal disease)   HTN (hypertension)   DM2 (diabetes mellitus, type 2)   Anemia   New onset a-fib   Hypotension, unspecified        Litisha Guagliardo  [facility] Pager [phone]. If 7PM-7AM, please contact night-coverage at [email or web address], password [facility] 06/25/2012, 7:34 AM  LOS: 0 days

## 2012-06-25 NOTE — Progress Notes (Signed)
ANTICOAGULATION CONSULT NOTE - Initial Consult  Pharmacy Consult for heparin Indication: atrial fibrillation  Allergies  Allergen Reactions  . Codeine Swelling    Patient Measurements: Height: 5\' 2"  (157.5 cm) Weight: 145 lb 15.1 oz (66.2 kg) IBW/kg (Calculated) : 50.1 Heparin Dosing Weight: 65kg  Vital Signs: Temp: 98.6 F (37 C) (04/10 0330) Temp src: Oral (04/10 0330) BP: 93/45 mmHg (04/10 0417) Pulse Rate: 106 (04/10 0417)  Labs:  Recent Labs  06/22/12 1300 06/24/12 1724 06/24/12 2358  HGB 9.8* 9.8*  --   HCT 30.0* 30.0*  --   PLT 341 377  --   CREATININE 9.70* 8.68*  --   TROPONINI  --   --  <0.30    Estimated Creatinine Clearance: 4.5 ml/min (by C-G formula based on Cr of 8.68).   Medical History: Past Medical History  Diagnosis Date  . CHF (congestive heart failure)   . DM2 (diabetes mellitus, type 2)   . ESRD (end stage renal disease)     HD TTS - [facility]  . HTN (hypertension)   . Hyperlipidemia   . Tibia/fibula fracture 07/10/2011    right   . Hypothyroid   . Cancer     Breast  . Arthritis   . History of blood transfusion   . Hyperparathyroidism, secondary renal     Medications:  Prescriptions prior to admission  Medication Sig Dispense Refill  . acetaminophen (TYLENOL) 650 MG CR tablet Take 650 mg by mouth every 8 (eight) hours as needed. arthritis      . amLODipine (NORVASC) 10 MG tablet Take 10 mg by mouth daily.      Marland Kitchen aspirin EC 81 MG tablet Take 81 mg by mouth daily.      . B Complex-C-Folic Acid (VOL-CARE RX) 1 MG TABS Take by mouth at bedtime.      . calcium acetate (PHOSLO) 667 MG capsule Take 667 mg by mouth 3 (three) times daily with meals.      . cloNIDine (CATAPRES) 0.2 MG tablet Take 0.2 mg by mouth 2 (two) times daily.      . ergocalciferol (VITAMIN D2) 50000 UNITS capsule Take 50,000 Units by mouth every Thursday. Usually on Thursday      . insulin lispro protamine-insulin lispro (HUMALOG MIX 75/25 KWIKPEN) (75-25) 100  UNIT/ML SUSP Inject 5-10 Units into the skin 2 (two) times daily. 5 units AM and 10 units PM  10 mL  12  . isosorbide mononitrate (IMDUR) 30 MG 24 hr tablet Take 30 mg by mouth daily.      Marland Kitchen levothyroxine (SYNTHROID, LEVOTHROID) 50 MCG tablet Take 50 mcg by mouth daily.      . pantoprazole (PROTONIX) 40 MG tablet Take 1 tablet (40 mg total) by mouth daily at 12 noon.  30 tablet  0  . simvastatin (ZOCOR) 10 MG tablet Take 10 mg by mouth at bedtime.      . vitamin E 400 UNIT capsule Take 400 Units by mouth daily.       Scheduled:  . aspirin EC  81 mg Oral Daily  . calcium acetate  667 mg Oral TID WC  . docusate sodium  100 mg Oral BID  . insulin aspart  0-9 Units Subcutaneous Q4H  . insulin glargine  5 Units Subcutaneous QHS  . levothyroxine  50 mcg Oral QAC breakfast  . pantoprazole  40 mg Oral Q1200  . senna  1 tablet Oral BID  . simvastatin  10 mg Oral QHS  .  sodium chloride  3 mL Intravenous Q12H  . sodium chloride  3 mL Intravenous Q12H     Assessment: 79yo female had been admitted to inpatient rehab nearly one week ago after admission for tibial shaft fracture with ORIF and requiring non-weight-bearing x6wk, now with HR to 140s and EKG showing new-onset Afib, to begin heparin.  Goal of Therapy:  Heparin level 0.3-0.7 units/ml Monitor platelets by anticoagulation protocol: Yes   Plan:  Rec'd Lovenox at 2200 and gets heparin in HD; will begin heparin gtt at 900 units/hr and monitor heparin levels and CBC.  Vernard Gambles, PharmD, BCPS  06/25/2012,4:55 AM

## 2012-06-25 NOTE — Discharge Summary (Signed)
[name], [name] NO.:  [id number]  MEDICAL RECORD NO.:  [id number]  LOCATION:  2913                         FACILITY:  [facility]  PHYSICIAN:  Ranelle Oyster, M.D.DATE OF BIRTH:  04-03-1928  DATE OF ADMISSION:  06/25/2012 DATE OF DISCHARGE:  06/25/2012                              DISCHARGE SUMMARY   DISCHARGE DIAGNOSES: 1. Right tibia nonunion fracture, open reduction and internal fixation     on June 16, 2012. 2. Subcutaneous Lovenox for deep vein thrombosis prophylaxis. 3. Pain management. 4. End-stage renal disease, on hemodialysis. 5. New onset of atrial fibrillation. 6. Chronic anemia. 7. Hypertension. 8. Diabetes mellitus with peripheral neuropathy. 9. Diastolic congestive heart failure. 10.Hypothyroidism. 11.Hyperlipidemia. 12.History of left mastectomy secondary to cancer.  HISTORY OF PRESENT ILLNESS:  This is an 79 year old right-handed female with end-stage renal disease, admitted on June 15, 2012, with history of right distal 1/3 tibia shaft fracture in April 2013, treated nonoperatively with numerous modalities including splinting, cast, and a Cam boot as well as bone stimulator.  The patient with continued pain at the fracture site.  Underwent repair of right tibia nonunion with open reduction and internal fixation on June 16, 2012, per Dr. Carola Frost. Nonweightbearing right lower extremity x6 weeks.  Subcutaneous Lovenox for DVT prophylaxis.  Chronic anemia of 9.9 and monitored with followup blood draws with hemodialysis.  Physical occupational therapy ongoing. The patient was admitted for comprehensive rehab program.  PAST MEDICAL HISTORY:  See discharge diagnoses.  SOCIAL HISTORY:  Lives with daughter and assistance as needed.  FUNCTIONAL HISTORY:  Prior to admission, retired, did not drive. Functional status upon admission to rehab services was moderate assist ambulate 12 feet with a rolling walker.  PHYSICAL EXAMINATION:  VITAL  SIGNS:  Blood pressure 184/49, pulse 76, temperature 98.4, respirations 18. GENERAL:  This was an alert female, oriented x3.  Pupils reactive to light. LUNGS:  Clear to auscultation. CARDIAC:  Regular rate rhythm. ABDOMEN:  Soft, nontender.  Good bowel sounds. EXTREMITIES:  Cast in place to right lower extremity with neurovascular sensation intact.  REHABILITATION HOSPITAL COURSE:  The patient was admitted to inpatient rehab services with therapies initiated on a 3-hour daily basis consisting of physical therapy, occupational therapy, and rehabilitation nursing.  The following issues were addressed during the patient's rehabilitation stay:  Pertaining to [name]'s right tibia nonunion fracture, she had undergone ORIF.  She was nonweightbearing.  She remained on subcutaneous Lovenox for DVT prophylaxis.  Venous Doppler studies lower extremities on June 20, 2012, showed no signs of DVT. Pain management with the use of oxycodone.  She continued dialysis as per [facility]al Services.  Chronic anemia followed while maintained on Aranesp.  Her blood pressures remained well controlled.  She exhibited no signs of fluid overload.  She continued on hormone supplement for hypothyroidism.  She did have a history of diabetes mellitus with peripheral neuropathy.  Hemoglobin A1c of 6.7.  She remained on 70/30 insulin 7 units twice daily.  The patient received weekly collaborative interdisciplinary team conferences to discuss estimated length of stay, family teaching, and any barriers to discharge.  She was progressing nicely in all areas of therapy.  Anticipated discharge date  June 27, 2012.  She was maintaining her nonweightbearing status.  She was minimal assist with a rolling walker, ambulating 30 feet, needing some assistance for lower body activities of daily living.  On the morning of June 25, 2012, noted elevated heart rate.  EKG showed new onset atrial fibrillation with RVR.  Cardiology  Services was consulted.  The patient placed on metoprolol.  Cardiac enzymes negative.  The patient was transferred to acute Care Services for ongoing telemetry monitoring. Placed on heparin therapy.  All medication changes were made as per hospitalist team as well as Cardiology.     Mariam Dollar, P.A.   ______________________________ Ranelle Oyster, M.D.    DA/MEDQ  D:  06/25/2012  T:  06/25/2012  Job:  [id number]  cc:   Vikki Ports A. Felicity Coyer, MD

## 2012-06-25 NOTE — Progress Notes (Signed)
CRITICAL VALUE ALERT  Critical value received: Hgb 5.6  Date of notification:  06/25/12  Time of notification:  0625  Critical value read back:yes  Nurse who received alert:  Rocco Pauls RN  MD notified (1st page):  Maren Reamer NP  Time of first page:  [phone]  MD notified (2nd page):  Time of second page:  Responding MD:  Maren Reamer  Time MD responded: [phone]  Order to redraw lab

## 2012-06-25 NOTE — Progress Notes (Signed)
Subjective:  Had low BP's after HD yesterday in 70's-90's and went into rapid afib.  Converted to NSR this morning. No complaints now, says "they took to much off at dialysis yesterday"  Objective: Vital signs in last 24 hours: Temp:  [97.4 F (36.3 C)-98.6 F (37 C)] 97.8 F (36.6 C) (04/10 1128) Pulse Rate:  [56-145] 56 (04/10 1200) Resp:  [14-23] 19 (04/10 1200) BP: (79-169)/(30-92) 112/46 mmHg (04/10 1200) SpO2:  [77 %-100 %] 100 % (04/10 1200) Weight:  [62.5 kg (137 lb 12.6 oz)-67.3 kg (148 lb 5.9 oz)] 66.2 kg (145 lb 15.1 oz) (04/10 0330) Weight change:   Intake/Output from previous day: 04/09 0701 - 04/10 0700 In: 289.6 [I.V.:39.6; IV Piggyback:250] Out: -  Total I/O In: 524 [P.O.:500; I.V.:24] Out: -  EXAM: General appearance:  Alert, in no apparent distress Resp:  CTA without rales, rhonchi, or wheezes Cardio:  RRR without murmur or rub GI:  + BS, soft and nontender Extremities:  No edema Access:  AVF @ RUA with + bruit  Lab Results:  Recent Labs  06/25/12 0540 06/25/12 0650  WBC QUESTIONABLE RESULTS, RECOMMEND RECOLLECT TO VERIFY 11.7*  HGB QUESTIONABLE RESULTS, RECOMMEND RECOLLECT TO VERIFY 10.0*  HCT QUESTIONABLE RESULTS, RECOMMEND RECOLLECT TO VERIFY 31.3*  PLT QUESTIONABLE RESULTS, RECOMMEND RECOLLECT TO VERIFY 334   BMET:   Recent Labs  06/24/12 1724  NA 132*  K 4.4  CL 91*  CO2 26  GLUCOSE 199*  BUN 45*  CREATININE 8.68*  CALCIUM 9.9  ALBUMIN 2.8*   No results found for this basename: PTH,  in the last 72 hours Iron Studies: No results found for this basename: IRON, TIBC, TRANSFERRIN, FERRITIN,  in the last 72 hours  Dialysis Orders: Center: [place] - transient who visits frequently from [place] MWF 3.25 hours 2 K 2.25 EDW 68 heparin 2000 Epo 4400 TIW venofer 50/week hectorol 3 TIW  Assessment/Plan: 1. S/p ORIF of right tibia - 4/1 per Dr. Carola Frost after fx in 06/2011, rehab since 4/4 2. Afib with RVR- back in NSR, per primary. May have been  triggered by low BP's after HD yesterday 3. ESRD - HD on MWF, transient from [place], but receives HD at [place] when visiting;  4. HTN/Volume - BP 149/74 on Amlodipine 10 mg qd, Clonidine 0.2 mg bid; is 2kg below dryweight with post-HD hypotension yesterday.  Minimal UF with HD tomorrow, goal is 68kg. 5. Anemia - Hgb 9.8, Aranesp 40 mcg on Wed.  6. Secondary hyperparathyroidism - Ca 9.7 (10.7 corrected), P 4.1; Hectorol 3 mcg, Phoslo 1 with meals.  7. Nutrition - Alb 2.8, high protein renal diet, vitamin.  8. DM - on insulin.  9. Hypothyroidism - on Birdie Hopes  MD [phone] pgr    [phone] cell 06/25/2012, 1:53 PM

## 2012-06-25 NOTE — Significant Event (Signed)
Rapid Response Event Note  Overview: Time Called: 2350 Arrival Time: 2355 Event Type: Cardiac  Initial Focused Assessment: Called by bedside RN regarding elevated HR and EKG showing new onset AFIB RVR. PA notified and orders received prior to my arrival. Patient lying in bed, denies shortness of breath and chest pain, HR 130-140s, SBP 100-120s, RR 20, 99% on 3L [facility]. Patient denies any pain but states " I think they took too much off in dialysis today".   Interventions: PO lopressor given by bedside RN, follow up vitals in 1 hr, HR 130-140s, SBP 90s, RR 20, 99% on 3LNC. PA notified. [facility] to admit patient to SDU for further monitoring and interventions  Event Summary: Name of Physician Notified: Delle Reining, PA at 2340  Name of Consulting Physician Notified: Dr. Adela Glimpse at 0200  Outcome: Transferred (Comment) (2900 SDU)     Burna Forts, Isidoro Donning

## 2012-06-25 NOTE — Progress Notes (Signed)
  Echocardiogram 2D Echocardiogram has been performed.  Ho Parisi 06/25/2012, 10:22 AM

## 2012-06-25 NOTE — Progress Notes (Signed)
ANTICOAGULATION CONSULT NOTE - Follow Up Consult  Pharmacy Consult for coumadin Indication: atrial fibrillation  Allergies  Allergen Reactions  . Codeine Swelling    Patient Measurements: Height: 5\' 2"  (157.5 cm) Weight: 145 lb 15.1 oz (66.2 kg) IBW/kg (Calculated) : 50.1  Vital Signs: Temp: 97.4 F (36.3 C) (04/10 0739) Temp src: Oral (04/10 0739) BP: 112/53 mmHg (04/10 0830) Pulse Rate: 104 (04/10 0830)  Labs:  Recent Labs  06/22/12 1300 06/24/12 1724 06/24/12 2358 06/25/12 0540 06/25/12 0650  HGB 9.8* 9.8*  --  QUESTIONABLE RESULTS, RECOMMEND RECOLLECT TO VERIFY 10.0*  HCT 30.0* 30.0*  --  QUESTIONABLE RESULTS, RECOMMEND RECOLLECT TO VERIFY 31.3*  PLT 341 377  --  QUESTIONABLE RESULTS, RECOMMEND RECOLLECT TO VERIFY 334  CREATININE 9.70* 8.68*  --   --   --   TROPONINI  --   --  <0.30 <0.30  --     Estimated Creatinine Clearance: 4.5 ml/min (by C-G formula based on Cr of 8.68).   Medications:  Scheduled:  . aspirin EC  81 mg Oral Daily  . calcium acetate  667 mg Oral TID WC  . docusate sodium  100 mg Oral BID  . insulin aspart  0-9 Units Subcutaneous Q4H  . insulin glargine  5 Units Subcutaneous QHS  . levothyroxine  50 mcg Oral QAC breakfast  . metoprolol tartrate  25 mg Oral Q6H  . pantoprazole  40 mg Oral Q1200  . senna  1 tablet Oral BID  . simvastatin  10 mg Oral QHS  . [COMPLETED] sodium chloride  250 mL Intravenous Once  . sodium chloride  3 mL Intravenous Q12H  . sodium chloride  3 mL Intravenous Q12H  . sodium chloride  3 mL Intravenous Q12H    Assessment: 79 yo female with new onset afib to start coumadin (CHA2DS2VASc=6) . Baseline INR was 1.04 on 06/16/12. No bridge per Cardiology.  Goal of Therapy:  INR 2-3 Monitor platelets by anticoagulation protocol: Yes   Plan:  -Coumadin 5mg  po -Daily PT/INR -Will begin education process  Harland German, Pharm D 06/25/2012 10:58 AM

## 2012-06-25 NOTE — Discharge Summary (Deleted)
NAME:  [name] [name]               ACCOUNT NO.:  [id number]  MEDICAL RECORD NO.:  [id number]  LOCATION:  2913                         FACILITY:  [facility]  PHYSICIAN:  Zachary T. Swartz, M.D.DATE OF BIRTH:  04/19/1928  DATE OF ADMISSION:  06/25/2012 DATE OF DISCHARGE:  06/25/2012                              DISCHARGE SUMMARY   DISCHARGE DIAGNOSES: 1. Right tibia nonunion fracture, open reduction and internal fixation     on June 16, 2012. 2. Subcutaneous Lovenox for deep vein thrombosis prophylaxis. 3. Pain management. 4. End-stage renal disease, on hemodialysis. 5. New onset of atrial fibrillation. 6. Chronic anemia. 7. Hypertension. 8. Diabetes mellitus with peripheral neuropathy. 9. Diastolic congestive heart failure. 10.Hypothyroidism. 11.Hyperlipidemia. 12.History of left mastectomy secondary to cancer.  HISTORY OF PRESENT ILLNESS:  This is an 79-year-old right-handed female with end-stage renal disease, admitted on June 15, 2012, with history of right distal 1/3 tibia shaft fracture in April 2013, treated nonoperatively with numerous modalities including splinting, cast, and a Cam boot as well as bone stimulator.  The patient with continued pain at the fracture site.  Underwent repair of right tibia nonunion with open reduction and internal fixation on June 16, 2012, per Dr. Handy. Nonweightbearing right lower extremity x6 weeks.  Subcutaneous Lovenox for DVT prophylaxis.  Chronic anemia of 9.9 and monitored with followup blood draws with hemodialysis.  Physical occupational therapy ongoing. The patient was admitted for comprehensive rehab program.  PAST MEDICAL HISTORY:  See discharge diagnoses.  SOCIAL HISTORY:  Lives with daughter and assistance as needed.  FUNCTIONAL HISTORY:  Prior to admission, retired, did not drive. Functional status upon admission to rehab services was moderate assist ambulate 12 feet with a rolling walker.  PHYSICAL EXAMINATION:  VITAL  SIGNS:  Blood pressure 184/49, pulse 76, temperature 98.4, respirations 18. GENERAL:  This was an alert female, oriented x3.  Pupils reactive to light. LUNGS:  Clear to auscultation. CARDIAC:  Regular rate rhythm. ABDOMEN:  Soft, nontender.  Good bowel sounds. EXTREMITIES:  Cast in place to right lower extremity with neurovascular sensation intact.  REHABILITATION HOSPITAL COURSE:  The patient was admitted to inpatient rehab services with therapies initiated on a 3-hour daily basis consisting of physical therapy, occupational therapy, and rehabilitation nursing.  The following issues were addressed during the patient's rehabilitation stay:  Pertaining to [name]'s right tibia nonunion fracture, she had undergone ORIF.  She was nonweightbearing.  She remained on subcutaneous Lovenox for DVT prophylaxis.  Venous Doppler studies lower extremities on June 20, 2012, showed no signs of DVT. Pain management with the use of oxycodone.  She continued dialysis as per Renal Services.  Chronic anemia followed while maintained on Aranesp.  Her blood pressures remained well controlled.  She exhibited no signs of fluid overload.  She continued on hormone supplement for hypothyroidism.  She did have a history of diabetes mellitus with peripheral neuropathy.  Hemoglobin A1c of 6.7.  She remained on 70/30 insulin 7 units twice daily.  The patient received weekly collaborative interdisciplinary team conferences to discuss estimated length of stay, family teaching, and any barriers to discharge.  She was progressing nicely in all areas of therapy.  Anticipated discharge date   June 27, 2012.  She was maintaining her nonweightbearing status.  She was minimal assist with a rolling walker, ambulating 30 feet, needing some assistance for lower body activities of daily living.  On the morning of June 25, 2012, noted elevated heart rate.  EKG showed new onset atrial fibrillation with RVR.  Cardiology  Services was consulted.  The patient placed on metoprolol.  Cardiac enzymes negative.  The patient was transferred to acute Care Services for ongoing telemetry monitoring. Placed on heparin therapy.  All medication changes were made as per hospitalist team as well as Cardiology.     Yer Castello, P.A.   ______________________________ Zachary T. Swartz, M.D.    DA/MEDQ  D:  06/25/2012  T:  06/25/2012  Job:  [id number]  cc:   Valerie A. Leschber, MD 

## 2012-06-25 NOTE — Consult Note (Signed)
Reason for Consult: Anticoagulation for A-Fib; Chest Pain Referring Physician: Dr. Lavera Guise   HPI: The patient is an 79 y.o. AAF with a history of  HTN, T2DM, CKD (HD: M,W,F), breast cancer, s/p mastectomy, and hypothyroidism. 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 She was followed by our practice during an admission for pulmonary edema, secondary to hypertensive emergency, in June 2011. An echo at that time revealed normal systolic function with an EF of 60-65%. No WMA. Mild aortic regurgitation, mild-moderated tricuspid regurgitation and moderately increased pulmonary pressures. Unfortunately, she was lost to follow-up. She presented to the [facility] on 06/24/12 for acute onset of SOB, which she contributes to too much fluid removal from dialysis. She reports that up until yesterday, she was in her usual state of health. She reports that this has never happened before. She also endorses associated lightheadedness and dizziness. No syncope/presyncope. No palpitations or diaphoresis. She denies chest pain, but reports feeling a heaviness in her chest 2 days ago. It was substernal and self-limitting. She endorsed belching. She felt it was indigestion. No chest discomfort since then. She denies SOB prior to yesterday. No LEE, PND or orthopnea. No fever, chills, n/v, hematochezia, melana or hematuria. Work-up in the ED revealed that she was in a-fib with RVR. She was admitted by [facility]. Her HR was controlled with IV BB. Diltiazem was held due to BP. This morning, she converted to NSR. She reports that she is breathing better. She continues to deny chest pain.     Past Medical History  Diagnosis Date  . CHF (congestive heart failure)   . DM2 (diabetes mellitus, type 2)   . ESRD (end stage renal disease)     HD TTS - [facility]  . HTN (hypertension)   . Hyperlipidemia   . Tibia/fibula fracture 07/10/2011    right   . Hypothyroid   . Cancer     Breast  . Arthritis   . History of blood  transfusion   . Hyperparathyroidism, secondary renal     Past Surgical History  Procedure Laterality Date  . Mastectomy    . Abdominal surgery    . Thyroid surgery    . Breast surgery Left     Mastectomy  . Eye surgery Bilateral     Cataract  . Ankle fusion Right   . Arteriovenous graft placement Right   . Orif tibia fracture Right 06/16/2012    Procedure: OPEN REDUCTION INTERNAL FIXATION (ORIF) TIBIA FRACTURE;  Surgeon: Budd Palmer, MD;  Location: [facility] OR;  Service: Orthopedics;  Laterality: Right;    Family History  Problem Relation Age of Onset  . Hyperlipidemia Father   . Diabetes Father   . Breast cancer Sister     Social History:  reports that she has never smoked. Her smokeless tobacco use includes Snuff. She reports that she does not drink alcohol or use illicit drugs.  Allergies:  Allergies  Allergen Reactions  . Codeine Swelling    Medications:  Prior to Admission:  Prescriptions prior to admission  Medication Sig Dispense Refill  . acetaminophen (TYLENOL) 650 MG CR tablet Take 650 mg by mouth every 8 (eight) hours as needed. arthritis      . amLODipine (NORVASC) 10 MG tablet Take 10 mg by mouth daily.      Marland Kitchen aspirin EC 81 MG tablet Take 81 mg by mouth daily.      . B Complex-C-Folic Acid (VOL-CARE RX) 1 MG TABS Take by  mouth at bedtime.      . calcium acetate (PHOSLO) 667 MG capsule Take 667 mg by mouth 3 (three) times daily with meals.      . cloNIDine (CATAPRES) 0.2 MG tablet Take 0.2 mg by mouth 2 (two) times daily.      . ergocalciferol (VITAMIN D2) 50000 UNITS capsule Take 50,000 Units by mouth every Thursday. Usually on Thursday      . insulin lispro protamine-insulin lispro (HUMALOG MIX 75/25 KWIKPEN) (75-25) 100 UNIT/ML SUSP Inject 5-10 Units into the skin 2 (two) times daily. 5 units AM and 10 units PM  10 mL  12  . isosorbide mononitrate (IMDUR) 30 MG 24 hr tablet Take 30 mg by mouth daily.      Marland Kitchen levothyroxine (SYNTHROID, LEVOTHROID) 50 MCG tablet  Take 50 mcg by mouth daily.      . pantoprazole (PROTONIX) 40 MG tablet Take 1 tablet (40 mg total) by mouth daily at 12 noon.  30 tablet  0  . simvastatin (ZOCOR) 10 MG tablet Take 10 mg by mouth at bedtime.      . vitamin E 400 UNIT capsule Take 400 Units by mouth daily.        Results for orders placed during the hospital encounter of 06/25/12 (from the past 48 hour(s))  CBC     Status: None   Collection Time    06/25/12  5:40 AM      Result Value Range   WBC    4.0 - 10.5 K/uL   Value: QUESTIONABLE RESULTS, RECOMMEND RECOLLECT TO VERIFY   Comment: T TOMLINSON,RN [id number] 0700 WIL[address]K     CORRECTED ON 04/10 AT 0711: PREVIOUSLY REPORTED AS 7.0   RBC    3.87 - 5.11 MIL/uL   Value: QUESTIONABLE RESULTS, RECOMMEND RECOLLECT TO VERIFY   Comment: T TOMLINSON,RN [id number] 0700 [address]K     CORRECTED ON 04/10 AT 0711: PREVIOUSLY REPORTED AS 1.79   Hemoglobin    12.0 - 15.0 g/dL   Value: QUESTIONABLE RESULTS, RECOMMEND RECOLLECT TO VERIFY   Comment: T TOMLINSON,RN [id number] 0700 [address]K     CORRECTED ON 04/10 AT 7829: PREVIOUSLY REPORTED AS 5.6 CRITICAL RESULT CALLED TO, READ BACK BY AND VERIFIED WITH: TOMLINSON,T RN 06/25/2012 0624 JORDANS REPEATED TO VERIFY SPECIMEN CHECKED FOR CLOTS   HCT    36.0 - 46.0 %   Value: QUESTIONABLE RESULTS, RECOMMEND RECOLLECT TO VERIFY   Comment: T TOMLINSON,RN [id number] 0700 [address]     CORRECTED ON 04/10 AT 0711: PREVIOUSLY REPORTED AS 17.2   MCV    78.0 - 100.0 fL   Value: QUESTIONABLE RESULTS, RECOMMEND RECOLLECT TO VERIFY   Comment: T TOMLINSON,RN [id number] 0700 W[address]K     CORRECTED ON 04/10 AT 0711: PREVIOUSLY REPORTED AS 96.1   MCH    26.0 - 34.0 pg   Value: QUESTIONABLE RESULTS, RECOMMEND RECOLLECT TO VERIFY   Comment: T TOMLINSON,RN [id number] 0700 WIL[address]K     CORRECTED ON 04/10 AT 0711: PREVIOUSLY REPORTED AS 31.3   MCHC    30.0 - 36.0 g/dL   Value: QUESTIONABLE RESULTS, RECOMMEND RECOLLECT TO VERIFY   Comment: T TOMLINSON,RN [id number] 0700 WILDERK     CORRECTED  ON 04/10 AT 0711: PREVIOUSLY REPORTED AS 32.6   RDW    11.5 - 15.5 %   Value: QUESTIONABLE RESULTS, RECOMMEND RECOLLECT TO VERIFY   Comment: T TOMLINSON,RN [phone] WILDERK     CORRECTED ON 04/10 AT 4401: PREVIOUSLY REPORTED AS 15.3   Platelets  150 - 400 K/uL   Value: QUESTIONABLE RESULTS, RECOMMEND RECOLLECT TO VERIFY   Comment: Louanne Skye [id number] WILDERK     CORRECTED ON 04/10 AT 0454: PREVIOUSLY REPORTED AS 187 DELTA CHECK NOTED SPECIMEN CHECKED FOR CLOTS REPEATED TO VERIFY  TROPONIN I     Status: None   Collection Time    06/25/12  5:40 AM      Result Value Range   Troponin I <0.30  <0.30 ng/mL   Comment:            Due to the release kinetics of cTnI,     a negative result within the first hours     of the onset of symptoms does not rule out     myocardial infarction with certainty.     If myocardial infarction is still suspected,     repeat the test at appropriate intervals.  CBC     Status: Abnormal   Collection Time    06/25/12  6:50 AM      Result Value Range   WBC 11.7 (*) 4.0 - 10.5 K/uL   RBC 3.26 (*) 3.87 - 5.11 MIL/uL   Hemoglobin 10.0 (*) 12.0 - 15.0 g/dL   HCT 09.8 (*) 11.9 - 14.7 %   MCV 96.0  78.0 - 100.0 fL   MCH 30.7  26.0 - 34.0 pg   MCHC 31.9  30.0 - 36.0 g/dL   RDW 82.9  56.2 - 13.0 %   Platelets 334  150 - 400 K/uL    No results found.  Review of Systems  Constitutional: Negative for fever, chills and diaphoresis.  Respiratory: Positive for shortness of breath.   Cardiovascular: Positive for chest pain (pressure-like discomfort 2 days ago). Negative for claudication, leg swelling and PND.  Gastrointestinal: Positive for heartburn. Negative for nausea, vomiting, abdominal pain, constipation, blood in stool and melena.  Genitourinary: Negative for hematuria.  Neurological: Positive for dizziness. Negative for loss of consciousness.   Blood pressure 98/52, pulse 103, temperature 97.4 F (36.3 C), temperature source Oral, resp. rate 18,  height 5\' 2"  (1.575 m), weight 145 lb 15.1 oz (66.2 kg), SpO2 99.00%. Physical Exam  Constitutional: She is oriented to person, place, and time. She appears well-developed and well-nourished. No distress.  HENT:  Head: Normocephalic and atraumatic.  Eyes: Conjunctivae and EOM are normal. Pupils are equal, round, and reactive to light.  Neck: No JVD present.  Cardiovascular: Intact distal pulses.  Exam reveals no gallop and no friction rub.   No murmur heard. Respiratory: Effort normal and breath sounds normal. No respiratory distress. She has no wheezes. She has no rales. She exhibits no tenderness.  GI: Soft. Bowel sounds are normal. She exhibits no distension and no mass.  Musculoskeletal: She exhibits no edema.  Lymphadenopathy:    She has no cervical adenopathy.  Neurological: She is alert and oriented to person, place, and time.  Skin: Skin is warm and dry. She is not diaphoretic.  Psychiatric: She has a normal mood and affect. Her behavior is normal.    Assessment/Plan: Principal Problem:   New onset a-fib Active Problems:   Tibia/fibula fracture   ESRD (end stage renal disease)   HTN (hypertension)   DM2 (diabetes mellitus, type 2)   Anemia   Hypotension, unspecified  Plan: New onset of A-fib w/ RVR. Was admitted by [facility]. Pt was place on BB. She has just converted to NSR. HR is in the upper 50s. BP is stable. Will need to  continue to monitor for PAF. May need anticoagulation for stroke prophylaxis. 2D echo is pending. Can also consider NST for risk stratification. MD to follow.   BRITTAINY M. SIMMONS PA-C 06/25/2012, 8:22 AM   I have seen and evaluated the patient this AM along with Robbie Lis, PA. I agree with her findings, examination as well as impression recommendations.   79 y/o woman with DM, HTN & ESRD on HD -- was in Inpt rehab s/p Tib/Fib Fxr & ORIF.  Was at HD yesterday, & was at "dry weight" pre-HD, & feels as though she had too much fluid removal.  Noted  acute SOB after HD on 4/9 & was found to be in Afib with RVR & mildly hypotensive, making BB or CCB dosing difficult.   Thankfully, she spontaneously converted to Sinus Brady 55-60 during her Echo this AM.  BS review of echo suggests mildly reduced EF with possible septal hypokinesis, but was difficult due to Afib.  Post-conversion, the WMA was not as noticeable.  Not sure if mild reduction in EF is Afib related, but with her multiple Cardiac RFs, would need ischemic evaluation for new onset Afib.  Most likley, the driving force was hypovolemia, but need to confirm no Ischemic CAD.     Will order Lexiscan Myoview for tomorrow for risk stratification  [id number] Score with mild LV dysfunction, DM, HTN, age & Female is 6 -- therefore meets criteria for Anticoagulation for Afib.  Data is not clear on NOAC use in HD patients.  Will initiate PO Warfarin today -- as she is no longer in AFib & < 24hr of Afib, no requirement for bridging.  Will review echo once uploaded to the system.   Marykay Lex, M.D., M.S. [facility] [address] [place], [place]  [address]  [phone] Pager # [phone] 06/25/2012 10:16 AM

## 2012-06-25 NOTE — Progress Notes (Signed)
06/24/2012 2115 pm- Received report from HD nurse she stated patient heart rate was 120. This nurse asked for recheck of pulse the HD nurse stated 105. Patient arrived to 4009 at approximately 2135 pm. Patient was assisted to bathroom, she complained of dizziness and fatigue. Patient assisted to bed checked vital signs 98.2, 110/72, 135,18,97% on room air. Pam Love,PA was notified states to give patient scheduled dose of Catapres 0.2 mg, and continue to monitor vital signs.  At approximately 2315 pm notified Marissa Nestle, PA notified heart rate 145 given order for EKG. Called back with results. Received order for stat cardiac enzymes every 8 hours x 3. Lopressor 50 mg every 6 hours dose now. Check vital signs every 15 minutes x 1 hour, then every hour x 2 and then every 2 hours if heart rate is 110, and bedrest. Spoke to patient's daughter for name of patient's cardiologist. Informed daughter of patient's condition. At 0019 am patient given Lopressor 50 mg vital signs 126/92 143 18, 93% 2L. At 0030 am BP 108/72, apical 141 O2 sat 98% on 2 liters. Patient resting quietly without complaint. Called rapid response nurse to assist with patient, she placed heart monitor at 0036. 0049 am BP 94/63, apical 142, 99% 2L.Called Pam Love, PA notified of BP given order to place normal saline lock in left hand.   At approximately 0130 oxygen saturation was 77% increased oxygen to 4 liter. Oxygen saturation 99%.  At 0150 am rechecked blood glucose for 168. Rechecked vital signs at 0152 84/59, 144, 99% on 4 liters. Notified Pam Love, given order to transfer patient to step down and start normal saline 100 ml/hour. Notified patient's daughter of PA order to transfer patient to step down unit. Patient is resting quietly will continue to monitor vital signs.

## 2012-06-25 NOTE — Care Management Note (Signed)
    Page 1 of 1   06/25/2012     8:11:09 AM   CARE MANAGEMENT NOTE 06/25/2012  Patient:  [name], [name]   Account Number:  [id number]  Date Initiated:  06/25/2012  Documentation initiated by:  Junius Creamer  Subjective/Objective Assessment:   adm w at fib and hypotension     Action/Plan:   lives alone, pcp dr Vikki Ports leschber   Anticipated DC Date:     Anticipated DC Plan:        DC Planning Services  CM consult      Choice offered to / List presented to:             Status of service:   Medicare Important Message given?   (If response is "NO", the following Medicare IM given date fields will be blank) Date Medicare IM given:   Date Additional Medicare IM given:    Discharge Disposition:    Per UR Regulation:  Reviewed for med. necessity/level of care/duration of stay  If discussed at Long Length of Stay Meetings, dates discussed:    Comments:  4/10  0809 debbie Venia Riveron rn,bsn adm from inpt rehab. hx of gentiva.

## 2012-06-25 NOTE — Progress Notes (Signed)
Cards called regarding HR. They recommend TH to admit to acute for IV diltiazem. Dr Felipa Furnace contacted-- she recommended transfer to step down.

## 2012-06-25 NOTE — H&P (Addendum)
PCP:   Rene Paci, MD  Cardiology non-local [facility] in [place]  Chief Complaint:  tachycardia  HPI: [name] is a 79 y.o. female   has a past medical history of CHF (congestive heart failure); DM2 (diabetes mellitus, type 2); ESRD (end stage renal disease); HTN (hypertension); Hyperlipidemia; Tibia/fibula fracture (07/10/2011); Hypothyroid; Cancer; Arthritis; History of blood transfusion; and Hyperparathyroidism, secondary renal.   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 On 06/16/12 she was admitted by orthopedics and had a Repair of right tibia nonunion with open reduction and internal fixation and Infuse allografting done. After POD #3 she was transferred to inpatient rehab. Of note patient has hx of ESRD and have been on HD on Monday, Wednesday and Friday. She has done well until yesterday when after her regular HD she developed new onset of tachycardia and was found to be in A.fib with RVR with HR in 140's. [facility] cardiology has been called and recommended metoprolol 50 mg PO q 6 hours. 1 hour after this was given she was still tachycardic and hospitalist was called to admit patient to [facility] for initiation of diltiazem gtt. Upon arrival to the unit patient was noted to by hypotensive with SBP down to 90's but also her heart rate   came down to 110's. She remains chest pain free.  Have deneis any fever or chills.  Patient is not sure but may have had an irregular heart beat before there is no hx of paroxysmal atrial fibrialltion. She have seen a Cardiologist once in [place] but not sure why.  Denies any recent chest pain or shortness of breath. Patient  states she was feeling just fine until she went to have her HD and she is concerned that too much fluid has been taken.   Review of Systems:     Pertinent positives include: fatigue,  Constitutional:  No weight loss, night sweats, Fevers, chills,  weight loss  HEENT:  No headaches, Difficulty  swallowing,Tooth/dental problems,Sore throat,  No sneezing, itching, ear ache, nasal congestion, post nasal drip,  Cardio-vascular:  No chest pain, Orthopnea, PND, anasarca, dizziness, palpitations.no Bilateral lower extremity swelling  GI:  No heartburn, indigestion, abdominal pain, nausea, vomiting, diarrhea, change in bowel habits, loss of appetite, melena, blood in stool, hematemesis Resp:  no shortness of breath at rest. No dyspnea on exertion, No excess mucus, no productive cough, No non-productive cough, No coughing up of blood.No change in color of mucus.No wheezing. Skin:  no rash or lesions. No jaundice GU:  no dysuria, change in color of urine, no urgency or frequency. No straining to urinate.  No flank pain.  Musculoskeletal:  No joint pain or no joint swelling. No decreased range of motion. No back pain.  Psych:  No change in mood or affect. No depression or anxiety. No memory loss.  Neuro: no localizing neurological complaints, no tingling, no weakness, no double vision, no gait abnormality, no slurred speech, no confusion  Otherwise ROS are negative except for above, 10 systems were reviewed  Past Medical History: Past Medical History  Diagnosis Date  . CHF (congestive heart failure)   . DM2 (diabetes mellitus, type 2)   . ESRD (end stage renal disease)     HD TTS - [facility]  . HTN (hypertension)   . Hyperlipidemia   . Tibia/fibula fracture 07/10/2011    right   . Hypothyroid   . Cancer     Breast  . Arthritis   .  History of blood transfusion   . Hyperparathyroidism, secondary renal    Past Surgical History  Procedure Laterality Date  . Mastectomy    . Abdominal surgery    . Thyroid surgery    . Breast surgery Left     Mastectomy  . Eye surgery Bilateral     Cataract  . Ankle fusion Right   . Arteriovenous graft placement Right   . Orif tibia fracture Right 06/16/2012    Procedure: OPEN REDUCTION INTERNAL FIXATION (ORIF) TIBIA FRACTURE;  Surgeon:  Budd Palmer, MD;  Location: [facility] OR;  Service: Orthopedics;  Laterality: Right;     Medications: Prior to Admission medications   Medication Sig Start Date End Date Taking? Authorizing Provider  acetaminophen (TYLENOL) 650 MG CR tablet Take 650 mg by mouth every 8 (eight) hours as needed. arthritis    Historical Provider, MD  amLODipine (NORVASC) 10 MG tablet Take 10 mg by mouth daily.    Historical Provider, MD  aspirin EC 81 MG tablet Take 81 mg by mouth daily.    Historical Provider, MD  B Complex-C-Folic Acid (VOL-CARE RX) 1 MG TABS Take by mouth at bedtime.    Historical Provider, MD  calcium acetate (PHOSLO) 667 MG capsule Take 667 mg by mouth 3 (three) times daily with meals.    Historical Provider, MD  cloNIDine (CATAPRES) 0.2 MG tablet Take 0.2 mg by mouth 2 (two) times daily.    Historical Provider, MD  ergocalciferol (VITAMIN D2) 50000 UNITS capsule Take 50,000 Units by mouth every Thursday. Usually on Thursday    Historical Provider, MD  insulin lispro protamine-insulin lispro (HUMALOG MIX 75/25 KWIKPEN) (75-25) 100 UNIT/ML SUSP Inject 5-10 Units into the skin 2 (two) times daily. 5 units AM and 10 units PM 08/02/11   Newt Lukes, MD  isosorbide mononitrate (IMDUR) 30 MG 24 hr tablet Take 30 mg by mouth daily.    Historical Provider, MD  levothyroxine (SYNTHROID, LEVOTHROID) 50 MCG tablet Take 50 mcg by mouth daily.    Historical Provider, MD  pantoprazole (PROTONIX) 40 MG tablet Take 1 tablet (40 mg total) by mouth daily at 12 noon. 07/14/11 07/13/12  Belkys A Regalado, MD  simvastatin (ZOCOR) 10 MG tablet Take 10 mg by mouth at bedtime.    Historical Provider, MD  vitamin E 400 UNIT capsule Take 400 Units by mouth daily.    Historical Provider, MD    Allergies:   Allergies  Allergen Reactions  . Codeine Swelling    Social History:  Ambulatory with walker , no weight bearing on right leg Lives at  home   reports that she has never smoked. Her smokeless tobacco use  includes Snuff. She reports that she does not drink alcohol or use illicit drugs.   Family History: family history includes Breast cancer in her sister; Diabetes in her father; and Hyperlipidemia in her father.    Physical Exam: Patient Vitals for the past 24 hrs:  BP Temp Temp src Pulse Resp SpO2 Height Weight  06/25/12 0348 83/44 mmHg - - 80 19 100 % - -  06/25/12 0330 107/35 mmHg 98.6 F (37 C) Oral 128 18 100 % 5\' 2"  (1.575 m) 66.2 kg (145 lb 15.1 oz)    1. General:  in No Acute distress 2. Psychological: Alert and   Oriented 3. Head/ENT:   Moist Mucous Membranes  Head Non traumatic, neck supple                          Normal Dentition 4. SKIN: decreased Skin turgor,  Skin clean Dry and intact no rash 5. Heart: irregular rate and rhythm systolic murmur present no Rub or gallop 6. Lungs: Clear to auscultation bilaterally, no wheezes or crackles   7. Abdomen: Soft, non-tender, Non distended 8. Lower extremities: no clubbing, cyanosis, or edema, Right Leg in a cast 9. Neurologically Grossly intact, moving all 4 extremities equally 10. MSK: Normal range of motion  body mass index is 26.69 kg/(m^2).   Labs on Admission:   Recent Labs  06/22/12 1300 06/24/12 1724  NA 134* 132*  K 5.2* 4.4  CL 94* 91*  CO2 26 26  GLUCOSE 221* 199*  BUN 39* 45*  CREATININE 9.70* 8.68*  CALCIUM 9.7 9.9  PHOS 4.1 3.6    Recent Labs  06/22/12 1300 06/24/12 1724  ALBUMIN 2.8* 2.8*   No results found for this basename: LIPASE, AMYLASE,  in the last 72 hours  Recent Labs  06/22/12 1300 06/24/12 1724  WBC 9.5 17.2*  HGB 9.8* 9.8*  HCT 30.0* 30.0*  MCV 95.2 94.9  PLT 341 377    Recent Labs  06/24/12 2358  TROPONINI <0.30   No results found for this basename: TSH, T4TOTAL, FREET3, T3FREE, THYROIDAB,  in the last 72 hours No results found for this basename: VITAMINB12, FOLATE, FERRITIN, TIBC, IRON, RETICCTPCT,  in the last 72 hours Lab Results   Component Value Date   HGBA1C 6.1* 06/17/2012    Estimated Creatinine Clearance: 4.5 ml/min (by C-G formula based on Cr of 8.68). ABG    Component Value Date/Time   PHART 7.354 07/14/2010 2106   HCO3 26.9* 07/14/2010 2106   TCO2 28.4 07/14/2010 2106   ACIDBASEDEF 6.0* 08/27/2009 0703   O2SAT 94.9 07/14/2010 2106     No results found for this basename: DDIMER     Other results:  I have pearsonaly reviewed this: ECG REPORT  Rate: 143   Rhythm: A.ib with RVR ST&T Change: no ischemic changes   Cultures:    Component Value Date/Time   SDES TISSUE RIGHT TIBIA 06/16/2012 0933   SDES TISSUE RIGHT TIBIA 06/16/2012 0933   SDES TISSUE RIGHT TIBIA 06/16/2012 0933   SPECREQUEST DISTAL NON UNION EXCAVAGATION 06/16/2012 0933   SPECREQUEST DISTAL NON UNION EXCAVAGATION 06/16/2012 0933   SPECREQUEST DISTAL NON UNION EXCAVAGATION 06/16/2012 0933   CULT NO ANAEROBES ISOLATED 06/16/2012 0933   CULT NO GROWTH 3 DAYS 06/16/2012 0933   REPTSTATUS 06/21/2012 FINAL 06/16/2012 0933   REPTSTATUS 06/19/2012 FINAL 06/16/2012 0933   REPTSTATUS 06/16/2012 FINAL 06/16/2012 0933       Radiological Exams on Admission: No results found.  Chart has been reviewed  Assessment/Plan  79 yo F new onset of A.fib and hypotention  Present on Admission:  . New onset a-fib - no hx of A.fib was found but patient states she may have had this in the past. For right now HR is stable in 110 and hypotensive will hold off on diltiazem gtt, give small IVF bolus and watch blood pressure. HSe is 10 days post op and have been on prophylactic lovenox, will start heparin gtt without bolus. Order 2D echo, TSH, cycle cardiac enzymes and obtain serial ECG. IF a. Fib continue to be uncontrolled and patient remains hypotensive may need cardiology consult.  Marland Kitchen HTN (hypertension) - hold  home medications given hypotension . ESRD (end stage renal disease) - not due for HD until tomorrow currently stable, no indication for emergent HD . DM2 (diabetes  mellitus, type 2) - SSI and low dose lantus watch blood sugars  . Anemia - stable no indication for transfusion . Hypotension, unspecified - possibly due to overdiuresis with HD, give small amount of IVF watch for sign of infection   Prophylaxis: heparin, drip Protonix  CODE STATUS: FULL CODE  Other plan as per orders.  I have spent a total of  55 min on this admission  Delphin Funes 06/25/2012, 4:10 AM

## 2012-06-25 NOTE — Progress Notes (Signed)
06/25/2012 0305 Patient transferred from in-patient rehab to step-down room 2913. Patient's belonging sent down as well.

## 2012-06-26 ENCOUNTER — Inpatient Hospital Stay (HOSPITAL_COMMUNITY): Payer: Medicare Other

## 2012-06-26 ENCOUNTER — Encounter (HOSPITAL_COMMUNITY): Payer: Medicare Other

## 2012-06-26 ENCOUNTER — Other Ambulatory Visit: Payer: Self-pay

## 2012-06-26 DIAGNOSIS — D649 Anemia, unspecified: Secondary | ICD-10-CM

## 2012-06-26 DIAGNOSIS — I5032 Chronic diastolic (congestive) heart failure: Secondary | ICD-10-CM

## 2012-06-26 DIAGNOSIS — I509 Heart failure, unspecified: Secondary | ICD-10-CM

## 2012-06-26 LAB — RENAL FUNCTION PANEL
BUN: 34 mg/dL — ABNORMAL HIGH (ref 6–23)
CO2: 27 mEq/L (ref 19–32)
Chloride: 93 mEq/L — ABNORMAL LOW (ref 96–112)
Creatinine, Ser: 7.47 mg/dL — ABNORMAL HIGH (ref 0.50–1.10)
Potassium: 4.8 mEq/L (ref 3.5–5.1)

## 2012-06-26 LAB — GLUCOSE, CAPILLARY
Glucose-Capillary: 126 mg/dL — ABNORMAL HIGH (ref 70–99)
Glucose-Capillary: 133 mg/dL — ABNORMAL HIGH (ref 70–99)
Glucose-Capillary: 112 mg/dL — ABNORMAL HIGH (ref 70–99)

## 2012-06-26 LAB — CBC
Hemoglobin: 10.2 g/dL — ABNORMAL LOW (ref 12.0–15.0)
MCHC: 32.4 g/dL (ref 30.0–36.0)
RBC: 3.27 MIL/uL — ABNORMAL LOW (ref 3.87–5.11)

## 2012-06-26 MED ORDER — PENTAFLUOROPROP-TETRAFLUOROETH EX AERO: 1.0000 "application " | INHALATION_SPRAY | CUTANEOUS | Status: DC | PRN

## 2012-06-26 MED ORDER — ACETAMINOPHEN 325 MG PO TABS
ORAL_TABLET | ORAL | Status: AC
  Administered 2012-06-26: 650 mg via ORAL
  Filled 2012-06-26: qty 2

## 2012-06-26 MED ORDER — REGADENOSON 0.4 MG/5ML IV SOLN
0.4000 mg | Freq: Once | INTRAVENOUS | Status: DC
  Filled 2012-06-26: qty 5

## 2012-06-26 MED ORDER — LIDOCAINE-PRILOCAINE 2.5-2.5 % EX CREA
1.0000 "application " | TOPICAL_CREAM | CUTANEOUS | Status: DC | PRN
  Filled 2012-06-26: qty 5

## 2012-06-26 MED ORDER — NEPRO/CARBSTEADY PO LIQD: 237.0000 mL | ORAL | Status: DC | PRN

## 2012-06-26 MED ORDER — ALTEPLASE 2 MG IJ SOLR: 2.0000 mg | Freq: Once | INTRAMUSCULAR | Status: DC | PRN

## 2012-06-26 MED ORDER — OXYCODONE HCL 5 MG PO TABS: 5.0000 mg | ORAL_TABLET | ORAL | Status: DC | PRN

## 2012-06-26 MED ORDER — LIDOCAINE HCL (PF) 1 % IJ SOLN: 5.0000 mL | INTRAMUSCULAR | Status: DC | PRN

## 2012-06-26 MED ORDER — METOPROLOL TARTRATE 25 MG PO TABS: 25.0000 mg | ORAL_TABLET | Freq: Two times a day (BID) | ORAL | Status: DC

## 2012-06-26 MED ORDER — TECHNETIUM TC 99M SESTAMIBI GENERIC - CARDIOLITE
30.0000 | Freq: Once | INTRAVENOUS | Status: AC | PRN
  Administered 2012-06-26: 30 via INTRAVENOUS

## 2012-06-26 MED ORDER — SODIUM CHLORIDE 0.9 % IV SOLN: 100.0000 mL | INTRAVENOUS | Status: DC | PRN

## 2012-06-26 MED ORDER — REGADENOSON 0.4 MG/5ML IV SOLN
INTRAVENOUS | Status: AC
  Administered 2012-06-26: 0.4 mg
  Filled 2012-06-26: qty 5

## 2012-06-26 MED ORDER — RENA-VITE PO TABS
1.0000 | ORAL_TABLET | Freq: Every day | ORAL | Status: DC
  Filled 2012-06-26: qty 1

## 2012-06-26 MED ORDER — OXYCODONE HCL 5 MG PO TABS
ORAL_TABLET | ORAL | Status: AC
  Administered 2012-06-26: 5 mg via ORAL
  Filled 2012-06-26: qty 1

## 2012-06-26 MED ORDER — TECHNETIUM TC 99M SESTAMIBI GENERIC - CARDIOLITE
10.0000 | Freq: Once | INTRAVENOUS | Status: AC | PRN
  Administered 2012-06-26: 10 via INTRAVENOUS

## 2012-06-26 MED ORDER — WARFARIN SODIUM 5 MG PO TABS: 5.0000 mg | ORAL_TABLET | Freq: Every day | ORAL | Status: DC

## 2012-06-26 MED ORDER — HEPARIN SODIUM (PORCINE) 1000 UNIT/ML DIALYSIS: 1000.0000 [IU] | INTRAMUSCULAR | Status: DC | PRN

## 2012-06-26 MED ORDER — HEPARIN SODIUM (PORCINE) 1000 UNIT/ML DIALYSIS: 2000.0000 [IU] | Freq: Once | INTRAMUSCULAR | Status: DC

## 2012-06-26 MED ORDER — WARFARIN SODIUM 5 MG PO TABS
5.0000 mg | ORAL_TABLET | Freq: Once | ORAL | Status: DC
  Filled 2012-06-26: qty 1

## 2012-06-26 NOTE — Discharge Summary (Signed)
Physician Discharge Summary  BRYANNAH BOSTON ZOX:[id number] DOB: 12/09/1928 DOA: 06/25/2012  PCP: Rene Paci, MD  Admit date: 06/25/2012 Discharge date: 06/26/2012  Time spent: 45 minutes  Recommendations for Outpatient Follow-up:  1. PT/INR at [facility] cardiology   Discharge Diagnoses:  New onset a-fib, converted to SR 06/26/12 - started on coumadin    Tibia/fibula fracture   ESRD (end stage renal disease)   HTN (hypertension)   DM2 (diabetes mellitus, type 2)   Hyperlipidemia   Anemia   Hypothyroid   Diastolic CHF, chronic   Discharge Condition: good  Diet recommendation: renal diet   Filed Weights   06/25/12 0330 06/25/12 2200 06/26/12 1512  Weight: 66.2 kg (145 lb 15.1 oz) 67.45 kg (148 lb 11.2 oz) 68.1 kg (150 lb 2.1 oz)    History of present illness:  [name] is a 79 y.o. female  has a past medical history of CHF (congestive heart failure); DM2 (diabetes mellitus, type 2); ESRD (end stage renal disease); HTN (hypertension); Hyperlipidemia; Tibia/fibula fracture (07/10/2011); Hypothyroid; Cancer; Arthritis; History of blood transfusion; and Hyperparathyroidism, secondary renal.  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 On 06/16/12 she was admitted by orthopedics and had a Repair of right tibia nonunion with open reduction and internal fixation and Infuse allografting done. After POD #3 she was transferred to inpatient rehab. Of note patient has hx of ESRD and have been on HD on Monday, Wednesday and Friday. She has done well until yesterday when after her regular HD she developed new onset of tachycardia and was found to be in A.fib with RVR with HR in 140's. [facility] cardiology has been called and recommended metoprolol 50 mg PO q 6 hours. 1 hour after this was given she was still tachycardic and hospitalist was called to admit patient to [facility] for initiation of diltiazem gtt. Upon arrival to the unit patient was noted to by hypotensive  with SBP down to 90's but also her heart rate came down to 110's. She remains chest pain free.  Have deneis any fever or chills.  Patient is not sure but may have had an irregular heart beat before there is no hx of paroxysmal atrial fibrialltion. She have seen a Cardiologist once in [place] but not sure why.  Denies any recent chest pain or shortness of breath. Patient states she was feeling just fine until she went to have her HD and she is concerned that too much fluid has been taken.      Hospital Course:  1. Afib with RVR - patient was transferred from CIR to 2900. She was placed on telemetry and cardiac enzymes were checked. Myoview stress test was negative for inducible ischemia. She converted to SR spontaneously. [facility] seen the patient and she was started on coumadin full dose for stroke prevention 2. Tibia fracture- outpt  F/u with Dr. Carola Frost  3. ESRD - HD as before  4. DM 2 patient was continue don insulin    Procedures: Myoview HD Consultations:  sehvc  renal  Discharge Exam: Filed Vitals:   06/26/12 1600 06/26/12 1630 06/26/12 1700 06/26/12 1730  BP: 168/61 175/60 199/85 202/79  Pulse: 68 68 71 71  Temp:      TempSrc:      Resp: 16 16 14 19   Height:      Weight:      SpO2: 100% 100%  100%    General: axox3 Cardiovascular: RRR Respiratory: ctab   Discharge  Instructions  Discharge Orders   Future Appointments Provider Department Dept Phone   07/02/2012 10:15 AM Newt Lukes, MD [facility] [phone]   Future Orders Complete By Expires     Diet renal 60/70-04-19-1198  As directed     Increase activity slowly  As directed         Medication List    STOP taking these medications       amLODipine 10 MG tablet  Commonly known as:  NORVASC     cloNIDine 0.2 MG tablet  Commonly known as:  CATAPRES     hydrALAZINE 50 MG tablet  Commonly known as:  APRESOLINE      TAKE these medications       acetaminophen 650 MG CR  tablet  Commonly known as:  TYLENOL  Take 650 mg by mouth every 8 (eight) hours as needed for pain.     aspirin EC 81 MG tablet  Take 81 mg by mouth daily.     b complex-vitamin c-folic acid 0.8 MG Tabs  Take 0.8 mg by mouth at bedtime.     calcium acetate 667 MG capsule  Commonly known as:  PHOSLO  Take 667 mg by mouth 3 (three) times daily with meals.     insulin lispro protamine-lispro (75-25) 100 UNIT/ML Susp  Commonly known as:  HUMALOG 75/25  Inject 5-10 Units into the skin 2 (two) times daily as needed.     isosorbide mononitrate 30 MG 24 hr tablet  Commonly known as:  IMDUR  Take 30 mg by mouth daily.     levothyroxine 75 MCG tablet  Commonly known as:  SYNTHROID, LEVOTHROID  Take 75 mcg by mouth daily before breakfast.     metoprolol tartrate 25 MG tablet  Commonly known as:  LOPRESSOR  Take 1 tablet (25 mg total) by mouth 2 (two) times daily.     oxyCODONE 5 MG immediate release tablet  Commonly known as:  Oxy IR/ROXICODONE  Take 1 tablet (5 mg total) by mouth every 4 (four) hours as needed for pain.     pantoprazole 40 MG tablet  Commonly known as:  PROTONIX  Take 40 mg by mouth daily.     polyethylene glycol packet  Commonly known as:  MIRALAX / GLYCOLAX  Take 17 g by mouth daily.     simvastatin 10 MG tablet  Commonly known as:  ZOCOR  Take 10 mg by mouth at bedtime.     Vitamin D (Ergocalciferol) 50000 UNITS Caps  Commonly known as:  DRISDOL  Take 50,000 Units by mouth every 7 (seven) days. Takes on Fridays.     vitamin E 400 UNIT capsule  Take 400 Units by mouth daily.     warfarin 5 MG tablet  Commonly known as:  COUMADIN  Take 1 tablet (5 mg total) by mouth daily.           Follow-up Information   Follow up with Rene Paci, MD.   Contact information:   [address] [place] [address] [phone]       Schedule an appointment as soon as possible for a visit with Marykay Lex, MD. (for INR  check)    Contact information:   [address] [place] [address] [phone]        The results of significant diagnostics from this hospitalization (including imaging, microbiology, ancillary and laboratory) are listed  below for reference.    Significant Diagnostic Studies: Dg Ankle Complete Right  06/16/2012  *RADIOLOGY REPORT*  Clinical Data: Internal fixation for a tibial nonunion.  RIGHT ANKLE - COMPLETE 3+ VIEW  Comparison: CT 04/14/2012  Findings: Three intraoperative images demonstrate a surgical plate along the medial aspect of the tibia with screw fixation.  There is irregularity of the distal tibia and consistent with history of nonunion.  Again noted is plate and screw fixation of the distal fibula.  IMPRESSION: Internal fixation of the distal tibia.   Original Report Authenticated By: Richarda Overlie, M.D.    Nm Myocar Multi W/spect W/wall Motion / Ef  06/26/2012  *RADIOLOGY REPORT*  Clinical Data:  New onset atrial fibrillation.  Multiple cardiac risk factors.  Technique:  Standard myocardial SPECT imaging performed after resting intravenous injection of 10 mCi Tc-20m sestamibi. Subsequently, intravenous infusion of Lexiscan performed under the supervision of the Cardiology staff.  At peak effect of the drug, 30 mCi Tc-59msestamibi injected intravenously and standard myocardial SPECT imaging performed.  Quantitative gated imaging also performed to evaluate left ventricular wall motion and estimate left ventricular ejection fraction.  Comparison:  None  MYOCARDIAL IMAGING WITH SPECT (REST AND PHARMACOLOGIC-STRESS)  Findings:  No evidence for pharmacologically induced myocardial reversibility.  GATED LEFT VENTRICULAR WALL MOTION STUDY  Findings:  Review of the gated images demonstrates there is mild to moderate hypokinesis involving the mid and basilar segments of the septal wall.  Normal myocardial thickening.  LEFT VENTRICULAR EJECTION FRACTION   Findings:  QGS ejection fraction measures 60% , with an end- diastolic volume of 76 ml and an end-systolic volume of 31 ml.  IMPRESSION:  1.  No evidence for pharmacologically induced myocardial ischemia. 2.  Hypokinesis involving the mid and basilar segments of the septal wall. 3.  Left ventricular ejection fraction equals 60%.   Original Report Authenticated By: Signa Kell, M.D.    Dg Ankle Right Port  06/16/2012  *RADIOLOGY REPORT*  Clinical Data: Postop  PORTABLE RIGHT ANKLE - 2 VIEW  Comparison: 04/14/2012 CT.  Findings: Overlying cast obscures fine osseous and soft tissue detail.  Remote internal fixation distal fibula.  Side plate and screws placed across distal right tibial nonunion.  IMPRESSION: Internal fixation distal right tibia nonunion.   Original Report Authenticated By: Lacy Duverney, M.D.     Microbiology: No results found for this or any previous visit (from the past 240 hour(s)).   Labs: Basic Metabolic Panel:  Recent Labs Lab 06/22/12 1300 06/24/12 1724 06/26/12 1547  NA 134* 132* 133*  K 5.2* 4.4 4.8  CL 94* 91* 93*  CO2 26 26 27   GLUCOSE 221* 199* 221*  BUN 39* 45* 34*  CREATININE 9.70* 8.68* 7.47*  CALCIUM 9.7 9.9 9.4  PHOS 4.1 3.6 3.3   Liver Function Tests:  Recent Labs Lab 06/22/12 1300 06/24/12 1724 06/26/12 1547  ALBUMIN 2.8* 2.8* 2.5*   No results found for this basename: LIPASE, AMYLASE,  in the last 168 hours No results found for this basename: AMMONIA,  in the last 168 hours CBC:  Recent Labs Lab 06/22/12 1300 06/24/12 1724 06/25/12 0540 06/25/12 0650 06/26/12 1220  WBC 9.5 17.2* QUESTIONABLE RESULTS, RECOMMEND RECOLLECT TO VERIFY 11.7* 12.0*  HGB 9.8* 9.8* QUESTIONABLE RESULTS, RECOMMEND RECOLLECT TO VERIFY 10.0* 10.2*  HCT 30.0* 30.0* QUESTIONABLE RESULTS, RECOMMEND RECOLLECT TO VERIFY 31.3* 31.5*  MCV 95.2 94.9 QUESTIONABLE RESULTS, RECOMMEND RECOLLECT TO VERIFY 96.0 96.3  PLT 341 377 QUESTIONABLE RESULTS, RECOMMEND RECOLLECT TO  VERIFY  334 497*   Cardiac Enzymes:  Recent Labs Lab 06/24/12 2358 06/25/12 0540  TROPONINI <0.30 <0.30   BNP: BNP (last 3 results) No results found for this basename: PROBNP,  in the last 8760 hours CBG:  Recent Labs Lab 06/25/12 2204 06/25/12 2353 06/26/12 0349 06/26/12 0749 06/26/12 1156  GLUCAP 142* 119* 126* 133* 164*       Signed:  Dulcemaria Bula  [facility] 06/26/2012, 5:42 PM

## 2012-06-26 NOTE — Progress Notes (Signed)
Subjective:  On hd no cos. Objective Vital signs in last 24 hours: Filed Vitals:   06/26/12 1235 06/26/12 1512 06/26/12 1525 06/26/12 1530  BP: 149/64 162/58 146/56 160/55  Pulse: 68 68 64 62  Temp: 97.9 F (36.6 C) 98.4 F (36.9 C)    TempSrc: Oral Oral    Resp: 16 16 16 17   Height:      Weight:  68.1 kg (150 lb 2.1 oz)    SpO2: 100% 98% 100% 100%   Weight change: 1.25 kg (2 lb 12.1 oz)  Intake/Output Summary (Last 24 hours) at 06/26/12 1557 Last data filed at 06/26/12 1358  Gross per 24 hour  Intake    769 ml  Output      0 ml  Net    769 ml   Labs: Basic Metabolic Panel:  Recent Labs Lab 06/22/12 1300 06/24/12 1724  NA 134* 132*  K 5.2* 4.4  CL 94* 91*  CO2 26 26  GLUCOSE 221* 199*  BUN 39* 45*  CREATININE 9.70* 8.68*  CALCIUM 9.7 9.9  PHOS 4.1 3.6   Liver Function Tests:  Recent Labs Lab 06/22/12 1300 06/24/12 1724  ALBUMIN 2.8* 2.8*   No results found for this basename: LIPASE, AMYLASE,  in the last 168 hours No results found for this basename: AMMONIA,  in the last 168 hours CBC:  Recent Labs Lab 06/22/12 1300 06/24/12 1724 06/25/12 0540 06/25/12 0650 06/26/12 1220  WBC 9.5 17.2* QUESTIONABLE RESULTS, RECOMMEND RECOLLECT TO VERIFY 11.7* 12.0*  HGB 9.8* 9.8* QUESTIONABLE RESULTS, RECOMMEND RECOLLECT TO VERIFY 10.0* 10.2*  HCT 30.0* 30.0* QUESTIONABLE RESULTS, RECOMMEND RECOLLECT TO VERIFY 31.3* 31.5*  MCV 95.2 94.9 QUESTIONABLE RESULTS, RECOMMEND RECOLLECT TO VERIFY 96.0 96.3  PLT 341 377 QUESTIONABLE RESULTS, RECOMMEND RECOLLECT TO VERIFY 334 497*   Cardiac Enzymes:  Recent Labs Lab 06/24/12 2358 06/25/12 0540  TROPONINI <0.30 <0.30   CBG:  Recent Labs Lab 06/25/12 2204 06/25/12 2353 06/26/12 0349 06/26/12 0749 06/26/12 1156  GLUCAP 142* 119* 126* 133* 164*    Iron Studies: No results found for this basename: IRON, TIBC, TRANSFERRIN, FERRITIN,  in the last 72 hours Studies/Results: Nm Myocar Multi W/spect W/wall Motion /  Ef  06/26/2012  *RADIOLOGY REPORT*  Clinical Data:  New onset atrial fibrillation.  Multiple cardiac risk factors.  Technique:  Standard myocardial SPECT imaging performed after resting intravenous injection of 10 mCi Tc-69m sestamibi. Subsequently, intravenous infusion of Lexiscan performed under the supervision of the Cardiology staff.  At peak effect of the drug, 30 mCi Tc-86msestamibi injected intravenously and standard myocardial SPECT imaging performed.  Quantitative gated imaging also performed to evaluate left ventricular wall motion and estimate left ventricular ejection fraction.  Comparison:  None  MYOCARDIAL IMAGING WITH SPECT (REST AND PHARMACOLOGIC-STRESS)  Findings:  No evidence for pharmacologically induced myocardial reversibility.  GATED LEFT VENTRICULAR WALL MOTION STUDY  Findings:  Review of the gated images demonstrates there is mild to moderate hypokinesis involving the mid and basilar segments of the septal wall.  Normal myocardial thickening.  LEFT VENTRICULAR EJECTION FRACTION  Findings:  QGS ejection fraction measures 60% , with an end- diastolic volume of 76 ml and an end-systolic volume of 31 ml.  IMPRESSION:  1.  No evidence for pharmacologically induced myocardial ischemia. 2.  Hypokinesis involving the mid and basilar segments of the septal wall. 3.  Left ventricular ejection fraction equals 60%.   Original Report Authenticated By: Signa Kell, M.D.    Medications:   . aspirin  EC  81 mg Oral Daily  . calcium acetate  667 mg Oral TID WC  . coumadin book   Does not apply Once  . docusate sodium  100 mg Oral BID  . [START ON 06/27/2012] heparin  2,000 Units Dialysis Once in dialysis  . insulin aspart  0-9 Units Subcutaneous Q4H  . insulin glargine  5 Units Subcutaneous QHS  . levothyroxine  50 mcg Oral QAC breakfast  . metoprolol tartrate  25 mg Oral Q6H  . pantoprazole  40 mg Oral Q1200  . regadenoson  0.4 mg Intravenous Once  . senna  1 tablet Oral BID  . simvastatin   10 mg Oral QHS  . sodium chloride  3 mL Intravenous Q12H  . warfarin  5 mg Oral ONCE-1800  . warfarin   Does not apply Once  . Warfarin - Pharmacist Dosing Inpatient   Does not apply q1800   I  have reviewed scheduled and prn medications.  Physical Exam: General: Alert, NAD. Appropriate Heart: RRR, no rub or Murmur Lungs: CTA Abdomen: BS POS. SOFT ,. Nontender Extremities: Dialysis Access: Right lower leg casting , no pedal edema/ Right    Dialysis Orders: Center: [place] - transient who visits frequently from [facility] MWF 3.25 hours 2 K 2.25 EDW 68 heparin 2000 Epo 4400 TIW venofer 50/week hectorol 3 TIW   Assessment/Plan:  1. S/p ORIF of right tibia - 4/1 per Dr. Carola Frost after fx in 06/2011, was on  Only 1 liter uf today rehab since 4/4 2. Afib with RVR- back in NSR, per primary. May have been triggered by low BP's after HD yesterday 3. ESRD - HD on MWF, transient from [place], but receives HD at [place] when visiting;  4. HTN/Volume - BP 166/50  On HD and on different bp meds now  metoprolol 25mg  q 6 hrs with A fibTX  needs  Bid dosing as per Cardiology Consulting with Negative  Stress Testing this am    60 % ef./ wt 68.1kg at edw now if correct scales;  5. Anemia - Hgb 9.8, up to 10.2  Aranesp 40 mcg on Wed.  6. Secondary hyperparathyroidism - Ca 9.9 (10.9corrected), P 3.6 on Hectorol 3 mcg, Phoslo 1 with meals.  7. Nutrition - Alb 2.8, high protein renal diet, vitamin.  8. DM - on insulin.  9. Hypothyroidism - on Synthroid   Lenny Pastel, PA-C [facility] Beeper [phone] 06/26/2012,3:57 PM  LOS: 1 day   Vinson Moselle  MD [phone] pgr    [phone] cell 06/26/2012, 6:02 PM

## 2012-06-26 NOTE — Progress Notes (Addendum)
PT Cancellation Note  Patient Details Name: [name] MRN: [id number] DOB: 1928-11-15   Cancelled Treatment:   11:00am  Unable to initiate PT eval.  Pt off unit for procedure.  Attempt to see pt 12:15pm, pt back in room but unable/unwilling to participate at this time due to just returning from procedure.  Will attempt PT eval as time allows.   [name] 06/26/2012, 11:08 AM

## 2012-06-26 NOTE — Progress Notes (Signed)
Subjective: No complaints  Objective: Vital signs in last 24 hours: Temp:  [97.6 F (36.4 C)-98.6 F (37 C)] 97.6 F (36.4 C) (04/11 0813) Pulse Rate:  [56-73] 70 (04/11 1037) Resp:  [14-20] 14 (04/11 0813) BP: (111-142)/(23-68) 133/61 mmHg (04/11 1036) SpO2:  [99 %-100 %] 100 % (04/11 0813) Weight:  [67.45 kg (148 lb 11.2 oz)] 67.45 kg (148 lb 11.2 oz) (04/10 2200) Weight change: 1.25 kg (2 lb 12.1 oz) Last BM Date: 06/25/12 Intake/Output from previous day: +944 04/10 0701 - 04/11 0700 In: 930 [P.O.:900; I.V.:30] Out: -  Intake/Output this shift:    PE: General:alert and oriented, no complaints Heart:S1S2 RRR Lungs:clear  Abd:+ BS, soft non tender Ext: no edema    Lab Results:  Recent Labs  06/25/12 0540 06/25/12 0650  WBC QUESTIONABLE RESULTS, RECOMMEND RECOLLECT TO VERIFY 11.7*  HGB QUESTIONABLE RESULTS, RECOMMEND RECOLLECT TO VERIFY 10.0*  HCT QUESTIONABLE RESULTS, RECOMMEND RECOLLECT TO VERIFY 31.3*  PLT QUESTIONABLE RESULTS, RECOMMEND RECOLLECT TO VERIFY 334   BMET  Recent Labs  06/24/12 1724  NA 132*  K 4.4  CL 91*  CO2 26  GLUCOSE 199*  BUN 45*  CREATININE 8.68*  CALCIUM 9.9    Recent Labs  06/24/12 2358 06/25/12 0540  TROPONINI <0.30 <0.30    No results found for this basename: CHOL,  HDL,  LDLCALC,  LDLDIRECT,  TRIG,  CHOLHDL   Lab Results  Component Value Date   HGBA1C 6.1* 06/17/2012     Lab Results  Component Value Date   TSH 5.463* 07/10/2011    Hepatic Function Panel  Recent Labs  06/24/12 1724  ALBUMIN 2.8*     Studies/Results: 2 D Echo:  06/25/12 Left ventricle: The cavity size was normal. There was mild concentric hypertrophy. Systolic function was mildly reduced. The estimated ejection fraction was in the range of 45% to 50%. Mild global hypokinesis. LV diastolic function cannot be assessed due to a-fib. The E/e' ratio is >10, suggesting elevated LV filling pressure. Ejection fraction: 45.7% (MOD, 1-plane). -  Aortic valve: Mildly calcified leaflets. There was no stenosis. Mild regurgitation. - Mitral valve: Heavily calcified annulus with calclification of the subvalvular apparatus. Mild regurgitation. - Left atrium: The atrium was severely dilated (>40 ml/m2). - Atrial septum: No defect or patent foramen ovale was identified. - Tricuspid valve: Moderate regurgitation. - Pulmonary arteries: PA peak pressure: 38mm Hg (S). - Systemic veins: The IVC measures <2.1 cm but does not collapse >50%, suggesting elevated RA pressure of 10-15 mmHg. - Pericardium, extracardiac: A trivial pericardial effusion was identified.    Medications: I have reviewed the patient's current medications. Scheduled Meds: . aspirin EC  81 mg Oral Daily  . calcium acetate  667 mg Oral TID WC  . coumadin book   Does not apply Once  . docusate sodium  100 mg Oral BID  . insulin aspart  0-9 Units Subcutaneous Q4H  . insulin glargine  5 Units Subcutaneous QHS  . levothyroxine  50 mcg Oral QAC breakfast  . metoprolol tartrate  25 mg Oral Q6H  . pantoprazole  40 mg Oral Q1200  . regadenoson  0.4 mg Intravenous Once  . senna  1 tablet Oral BID  . simvastatin  10 mg Oral QHS  . sodium chloride  3 mL Intravenous Q12H  . warfarin   Does not apply Once  . Warfarin - Pharmacist Dosing Inpatient   Does not apply q1800   Continuous Infusions:  PRN Meds:.sodium chloride, acetaminophen, acetaminophen, ondansetron (ZOFRAN)  IV, ondansetron, oxyCODONE, sodium chloride  Assessment/Plan: Principal Problem:   New onset a-fib, converted to SR 06/26/12 Active Problems:   Tibia/fibula fracture   ESRD (end stage renal disease)   HTN (hypertension)   DM2 (diabetes mellitus, type 2)   Hyperlipidemia   Anemia   Hypothyroid   Hypotension, unspecified   Diastolic CHF, chronic  PLAN: maintaining SR after admit with afib with RVR,  Pt thought she was dry after dialysis with lightheadedness. She converted yesterday to SR,  CHAD2  score elevated and is now on coumadin, not yet therapeutic, no crossover.    Had been on amlodipine, now on Lopressor 25 mg every 6 hours.  BP stable, change to 50 BID?  Echo with LV dysfunction but this was done while pt in a. Fib.  Also elevated PA pressure at 38,   Lexiscan myoview completed this AM without complications.  Results Pending.   LOS: 1 day   Time spent with pt. : . INGOLD,LAURA R 06/26/2012, 11:06 AM  Nuclear Cardiology IMPRESSION:  1. No evidence for pharmacologically induced myocardial ischemia.  2. Hypokinesis involving the mid and basilar segments of the  septal wall.  3. Left ventricular ejection fraction equals 60%.  Patient seen and examined. Agree with assessment and plan. Normal myocardial perfusion on myoview with post stress EF 60%.  Maintaining NSR. Getting coumadin.   Lennette Bihari, MD, [facility] 06/26/2012 3:23 PM

## 2012-06-26 NOTE — Progress Notes (Signed)
ANTICOAGULATION CONSULT NOTE - Follow Up Consult  Pharmacy Consult for Coumadin Indication: atrial fibrillation  Allergies  Allergen Reactions  . Codeine Swelling    Patient Measurements: Height: 5\' 2"  (157.5 cm) Weight: 148 lb 11.2 oz (67.45 kg) IBW/kg (Calculated) : 50.1  Vital Signs: Temp: 97.6 F (36.4 C) (04/11 0813) Temp src: Oral (04/11 0813) BP: 133/61 mmHg (04/11 1036) Pulse Rate: 70 (04/11 1037)  Labs:  Recent Labs  06/24/12 1724 06/24/12 2358 06/25/12 0540 06/25/12 0650 06/26/12 0645  HGB 9.8*  --  QUESTIONABLE RESULTS, RECOMMEND RECOLLECT TO VERIFY 10.0*  --   HCT 30.0*  --  QUESTIONABLE RESULTS, RECOMMEND RECOLLECT TO VERIFY 31.3*  --   PLT 377  --  QUESTIONABLE RESULTS, RECOMMEND RECOLLECT TO VERIFY 334  --   LABPROT  --   --   --   --  14.6  INR  --   --   --   --  1.16  CREATININE 8.68*  --   --   --   --   TROPONINI  --  <0.30 <0.30  --   --     Estimated Creatinine Clearance: 4.6 ml/min (by C-G formula based on Cr of 8.68).  Assessment: 79 y/o female with ESRD started on Coumadin yesterday for new Afib. INR is subtherapeutic. No bleeding noted, H/H low, pltc stable.   Goal of Therapy:  INR 2-3 Monitor platelets by anticoagulation protocol: Yes   Plan:  -Coumadin 5 mg po tonight -INR daily -Monitor for signs/symptoms of bleeding  [facility], [address].D., BCPS Clinical Pharmacist Pager: [phone] 06/26/2012 11:35 AM

## 2012-06-27 NOTE — Progress Notes (Signed)
   CARE MANAGEMENT NOTE 06/27/2012  Patient:  VEENA, STURGESS   Account Number:  [id number]  Date Initiated:  06/25/2012  Documentation initiated by:  Junius Creamer  Subjective/Objective Assessment:   adm w at fib and hypotension     Action/Plan:   lives alone, pcp dr Vikki Ports leschber   Anticipated DC Date:  06/27/2012   Anticipated DC Plan:  HOME/SELF CARE      DC Planning Services  CM consult      [facility] Choice  HOME HEALTH   Choice offered to / List presented to:  C-4 Adult Children        HH arranged  HH-1 RN      [facility] agency  [facility]   Status of service:  Completed, signed off Medicare Important Message given?   (If response is "NO", the following Medicare IM given date fields will be blank) Date Medicare IM given:   Date Additional Medicare IM given:    Discharge Disposition:  HOME W HOME HEALTH SERVICES  Per UR Regulation:  Reviewed for med. necessity/level of care/duration of stay  If discussed at Long Length of Stay Meetings, dates discussed:    Comments:  06/27/2012 1000 Received call from dtr, Alyssa Grove # [phone]. Stating she wants [facility] for [facility] RN. Pt will need PT/INR drawn. Faxed referral to [facility] for [facility] RN and lab draws. Isidoro Donning RN CCM Case Mgmt phone [phone]  [address] [place] [phone]  4/10  0809 debbie dowell rn,bsn adm from inpt rehab. hx of gentiva.

## 2012-07-02 ENCOUNTER — Ambulatory Visit (INDEPENDENT_AMBULATORY_CARE_PROVIDER_SITE_OTHER): Payer: Medicare Other | Admitting: Internal Medicine

## 2012-07-02 ENCOUNTER — Encounter: Payer: Self-pay | Admitting: Internal Medicine

## 2012-07-02 VITALS — BP 168/80 | HR 68 | Temp 97.1°F

## 2012-07-02 DIAGNOSIS — I4891 Unspecified atrial fibrillation: Secondary | ICD-10-CM

## 2012-07-02 DIAGNOSIS — E119 Type 2 diabetes mellitus without complications: Secondary | ICD-10-CM

## 2012-07-02 DIAGNOSIS — N186 End stage renal disease: Secondary | ICD-10-CM

## 2012-07-02 DIAGNOSIS — R05 Cough: Secondary | ICD-10-CM

## 2012-07-02 DIAGNOSIS — R059 Cough, unspecified: Secondary | ICD-10-CM

## 2012-07-02 DIAGNOSIS — IMO0002 Reserved for concepts with insufficient information to code with codable children: Secondary | ICD-10-CM

## 2012-07-02 MED ORDER — WARFARIN SODIUM 5 MG PO TABS: 5.0000 mg | ORAL_TABLET | Freq: Every day | ORAL | Status: DC

## 2012-07-02 MED ORDER — GUAIFENESIN ER 600 MG PO TB12: 1200.0000 mg | ORAL_TABLET | Freq: Two times a day (BID) | ORAL | Status: DC

## 2012-07-02 NOTE — Patient Instructions (Signed)
It was good to see you today. We have reviewed your hospital records including labs and tests today Medications reviewed and updated, no prescription changes at this time.  Use Mucinex twice daily in addition to [facility] for cough symptoms, call if worse or unimproved in next 5-7 days Please schedule followup in 3-4 months for diabetes check if you remain in [place], call sooner if problems.

## 2012-07-02 NOTE — Progress Notes (Signed)
Subjective:    Patient ID: [name], female    DOB: [birth year], 79 y.o.   MRN: [id number]  HPI  here for hospital follow up - Recommendations for Outpatient Follow-up:  1. PT/INR at [facility] cardiology  Discharge Diagnoses:  New onset a-fib, converted to SR 06/26/12 - started on coumadin  Tibia/fibula fracture  ESRD (end stage renal disease)  HTN (hypertension)  DM2 (diabetes mellitus, type 2)  Hyperlipidemia  Anemia  Hypothyroid  Diastolic CHF, chronic   Also reviewed ongoing medical issues: R tib-fib fx - s/p accidental fall 07/10/11, nonunion repair 06/2012 with [facility]. Denies pain or need for ongoing pain control  DM2, insulin dep - checks cbgs 3x/day - denies hypoglycemia symptoms - the patient reports compliance with medication(s) as prescribed. Denies adverse side effects.  ESRD - ongoing HD MWF [place] -   Hypothyroid -the patient reports compliance with medication(s) as prescribed. Denies adverse side effects.  Past Medical History  Diagnosis Date  . CHF (congestive heart failure)   . DM2 (diabetes mellitus, type 2)   . ESRD (end stage renal disease)     HD MWF - [facility]  . HTN (hypertension)   . Hyperlipidemia   . Tibia/fibula fracture 07/10/2011    right - nonunion repair 06/2012  . Hypothyroid   . Breast cancer   . Arthritis   . Hyperparathyroidism, secondary renal   . Atrial fibrillation dx 06/2012    on coumadin    Review of Systems  Constitutional: Negative for appetite change, fatigue and unexpected weight change.  Respiratory: Positive for cough (dry tickle in R side of throat x 2 days). Negative for choking, shortness of breath and wheezing.   Cardiovascular: Negative for chest pain, palpitations and leg swelling.  Neurological: Negative for dizziness, light-headedness and headaches.       Objective:   Physical Exam  BP 168/80  Pulse 68  Temp(Src) 97.1 F (36.2 C) (Oral)  SpO2 98% Constitutional: She appears well-developed and  well-nourished. No distress. dtr and gdtr at side Neck: Normal range of motion. Neck supple. No JVD present. No thyromegaly present.  Cardiovascular: Normal rate, regular rhythm and normal heart sounds.  No murmur heard. No BLE edema. Pulmonary/Chest: Effort normal and breath sounds normal. No respiratory distress. She has no wheezes.  Musculoskeletal: RLE in splint wrap to mid foot. Otherwise, normal range of motion, no joint effusions. No gross deformities Neurological: She is alert and oriented to person, place, and time. No cranial nerve deficit. Coordination normal.  Skin: Skin is warm and dry. No rash noted. No erythema.  Psychiatric: She has a normal mood and affect. Her behavior is normal. Judgment and thought content normal.   Lab Results  Component Value Date   WBC 12.0* 06/26/2012   HGB 10.2* 06/26/2012   HCT 31.5* 06/26/2012   PLT 497* 06/26/2012   GLUCOSE 221* 06/26/2012   ALT 7 06/17/2012   AST 29 06/17/2012   NA 133* 06/26/2012   K 4.8 06/26/2012   CL 93* 06/26/2012   CREATININE 7.47* 06/26/2012   BUN 34* 06/26/2012   CO2 27 06/26/2012   TSH 5.463* 07/10/2011   INR 1.16 06/26/2012   HGBA1C 6.1* 06/17/2012        Assessment & Plan:  See problem list. Medications and labs reviewed today.  Dry cough - no evidence for pulm issue or infection. euvolemic on exam. Advised OTC Mucinex and call if worse or unimproved  Time spent with pt/family today 30 minutes, greater than  50% time spent counseling patient on recent hospitalization for new AF, ortho repair, chronic med issues including diabetes and ESRD, and medication review. Also review of hospital records

## 2012-07-03 NOTE — Assessment & Plan Note (Signed)
Accidental fall 07/09/11 -  Casted tib-fib fx - nonunion Repair 06/2012 reviewed Not requiring pains meds at this time

## 2012-07-03 NOTE — Assessment & Plan Note (Signed)
Noted during hosp 06/2012 - spont conversion to NSR Remains on amio and coumadin, following at [facility] Herbie [place]) for same The current medical regimen is effective;  continue present plan and medications.

## 2012-07-03 NOTE — Assessment & Plan Note (Signed)
In [place], HD mwf - follows with renal for same The current medical regimen is effective;  continue present plan and medications.

## 2012-07-03 NOTE — Assessment & Plan Note (Signed)
Takes 75/25 at home  Lab Results  Component Value Date   HGBA1C 6.1* 06/17/2012   The current medical regimen is effective;  continue present plan and medications.

## 2012-07-08 ENCOUNTER — Emergency Department (HOSPITAL_COMMUNITY)
Admission: EM | Admit: 2012-07-08 | Discharge: 2012-07-08 | Disposition: A | Discharge status: Home / Self Care | Payer: Medicare Other | Attending: Emergency Medicine | Admitting: Emergency Medicine

## 2012-07-08 ENCOUNTER — Encounter (HOSPITAL_COMMUNITY): Payer: Self-pay | Admitting: Vascular Surgery

## 2012-07-08 DIAGNOSIS — Z794 Long term (current) use of insulin: Secondary | ICD-10-CM | POA: Insufficient documentation

## 2012-07-08 DIAGNOSIS — T829XXA Unspecified complication of cardiac and vascular prosthetic device, implant and graft, initial encounter: Secondary | ICD-10-CM

## 2012-07-08 DIAGNOSIS — I4891 Unspecified atrial fibrillation: Secondary | ICD-10-CM | POA: Insufficient documentation

## 2012-07-08 DIAGNOSIS — Z853 Personal history of malignant neoplasm of breast: Secondary | ICD-10-CM | POA: Insufficient documentation

## 2012-07-08 DIAGNOSIS — E785 Hyperlipidemia, unspecified: Secondary | ICD-10-CM | POA: Insufficient documentation

## 2012-07-08 DIAGNOSIS — E039 Hypothyroidism, unspecified: Secondary | ICD-10-CM | POA: Insufficient documentation

## 2012-07-08 DIAGNOSIS — I509 Heart failure, unspecified: Secondary | ICD-10-CM | POA: Insufficient documentation

## 2012-07-08 DIAGNOSIS — Z862 Personal history of diseases of the blood and blood-forming organs and certain disorders involving the immune mechanism: Secondary | ICD-10-CM | POA: Insufficient documentation

## 2012-07-08 DIAGNOSIS — Z8781 Personal history of (healed) traumatic fracture: Secondary | ICD-10-CM | POA: Insufficient documentation

## 2012-07-08 DIAGNOSIS — Y838 Other surgical procedures as the cause of abnormal reaction of the patient, or of later complication, without mention of misadventure at the time of the procedure: Secondary | ICD-10-CM | POA: Insufficient documentation

## 2012-07-08 DIAGNOSIS — N186 End stage renal disease: Secondary | ICD-10-CM | POA: Insufficient documentation

## 2012-07-08 DIAGNOSIS — Z79899 Other long term (current) drug therapy: Secondary | ICD-10-CM | POA: Insufficient documentation

## 2012-07-08 DIAGNOSIS — Z992 Dependence on renal dialysis: Secondary | ICD-10-CM | POA: Insufficient documentation

## 2012-07-08 DIAGNOSIS — F17200 Nicotine dependence, unspecified, uncomplicated: Secondary | ICD-10-CM | POA: Insufficient documentation

## 2012-07-08 DIAGNOSIS — I120 Hypertensive chronic kidney disease with stage 5 chronic kidney disease or end stage renal disease: Secondary | ICD-10-CM | POA: Insufficient documentation

## 2012-07-08 DIAGNOSIS — Z8639 Personal history of other endocrine, nutritional and metabolic disease: Secondary | ICD-10-CM | POA: Insufficient documentation

## 2012-07-08 DIAGNOSIS — E1029 Type 1 diabetes mellitus with other diabetic kidney complication: Secondary | ICD-10-CM | POA: Insufficient documentation

## 2012-07-08 DIAGNOSIS — Z7982 Long term (current) use of aspirin: Secondary | ICD-10-CM | POA: Insufficient documentation

## 2012-07-08 DIAGNOSIS — T82598A Other mechanical complication of other cardiac and vascular devices and implants, initial encounter: Secondary | ICD-10-CM | POA: Insufficient documentation

## 2012-07-08 DIAGNOSIS — Z8739 Personal history of other diseases of the musculoskeletal system and connective tissue: Secondary | ICD-10-CM | POA: Insufficient documentation

## 2012-07-08 DIAGNOSIS — Z7901 Long term (current) use of anticoagulants: Secondary | ICD-10-CM | POA: Insufficient documentation

## 2012-07-08 LAB — CBC WITH DIFFERENTIAL/PLATELET
Basophils Absolute: 0 10*3/uL (ref 0.0–0.1)
Eosinophils Relative: 1 % (ref 0–5)
HCT: 29.1 % — ABNORMAL LOW (ref 36.0–46.0)
Lymphocytes Relative: 13 % (ref 12–46)
Lymphs Abs: 1.4 10*3/uL (ref 0.7–4.0)
MCV: 93.9 fL (ref 78.0–100.0)
Monocytes Absolute: 1.4 10*3/uL — ABNORMAL HIGH (ref 0.1–1.0)
Neutro Abs: 8 10*3/uL — ABNORMAL HIGH (ref 1.7–7.7)
Platelets: 483 10*3/uL — ABNORMAL HIGH (ref 150–400)
RBC: 3.1 MIL/uL — ABNORMAL LOW (ref 3.87–5.11)
WBC: 11 10*3/uL — ABNORMAL HIGH (ref 4.0–10.5)

## 2012-07-08 LAB — COMPREHENSIVE METABOLIC PANEL
ALT: 6 U/L (ref 0–35)
AST: 21 U/L (ref 0–37)
CO2: 35 mEq/L — ABNORMAL HIGH (ref 19–32)
Calcium: 7.9 mg/dL — ABNORMAL LOW (ref 8.4–10.5)
Chloride: 94 mEq/L — ABNORMAL LOW (ref 96–112)
GFR calc Af Amer: 11 mL/min — ABNORMAL LOW (ref >90)
GFR calc non Af Amer: 10 mL/min — ABNORMAL LOW (ref >90)
Glucose, Bld: 140 mg/dL — ABNORMAL HIGH (ref 70–99)
Sodium: 139 mEq/L (ref 135–145)
Total Bilirubin: 0.4 mg/dL (ref 0.3–1.2)

## 2012-07-08 LAB — APTT: aPTT: 68 seconds — ABNORMAL HIGH (ref 24–37)

## 2012-07-08 MED ORDER — GELATIN ABSORBABLE 12-7 MM EX MISC
1.0000 | Freq: Once | CUTANEOUS | Status: AC
  Administered 2012-07-08: 1 via TOPICAL

## 2012-07-08 NOTE — ED Provider Notes (Signed)
History     CSN: [id number]  Arrival date & time 07/08/12  1617   First MD Initiated Contact with Patient 07/08/12 1632      Chief Complaint  Patient presents with  . Vascular Access Problem    (Consider location/radiation/quality/duration/timing/severity/associated sxs/prior treatment) HPI Pt with RUE bleeding from graft since 1430 today after dialysis. Unable to control bleeding with direct pressure. Pt denies dizziness, SOB, CP or any symptoms. Pt recently on coumadin.  Past Medical History  Diagnosis Date  . CHF (congestive heart failure)   . DM2 (diabetes mellitus, type 2)   . ESRD (end stage renal disease)     HD MWF - [facility]  . HTN (hypertension)   . Hyperlipidemia   . Tibia/fibula fracture 07/10/2011    right - nonunion repair 06/2012  . Hypothyroid   . Breast cancer   . Arthritis   . Hyperparathyroidism, secondary renal   . Atrial fibrillation dx 06/2012    on coumadin    Past Surgical History  Procedure Laterality Date  . Mastectomy    . Abdominal surgery    . Thyroid surgery    . Breast surgery Left     Mastectomy  . Eye surgery Bilateral     Cataract  . Ankle fusion Right   . Arteriovenous graft placement Right   . Orif tibia fracture Right 06/16/2012    Procedure: OPEN REDUCTION INTERNAL FIXATION (ORIF) TIBIA FRACTURE;  Surgeon: Budd Palmer, MD;  Location: [facility];  Service: Orthopedics;  Laterality: Right;    Family History  Problem Relation Age of Onset  . Hyperlipidemia Father   . Diabetes Father   . Breast cancer Sister     History  Substance Use Topics  . Smoking status: Never Smoker   . Smokeless tobacco: Current User    Types: Snuff  . Alcohol Use: No    OB History   Grav Para Term Preterm Abortions TAB SAB Ect Mult Living                  Review of Systems  Constitutional: Negative for fever and chills.  Respiratory: Negative for shortness of breath.   Cardiovascular: Negative for chest pain.  Gastrointestinal: Negative  for nausea, vomiting and abdominal pain.  Skin: Negative for rash and wound.  Neurological: Negative for dizziness, weakness, light-headedness and numbness.  Hematological: Bruises/bleeds easily.  All other systems reviewed and are negative.    Allergies  Codeine  Home Medications   Current Outpatient Rx  Name  Route  Sig  Dispense  Refill  . acetaminophen (TYLENOL) 650 MG CR tablet   Oral   Take 650 mg by mouth every 8 (eight) hours as needed for pain.         Marland Kitchen aspirin EC 81 MG tablet   Oral   Take 81 mg by mouth daily.         Marland Kitchen b complex-vitamin c-folic acid (NEPHRO-VITE) 0.8 MG TABS   Oral   Take 0.8 mg by mouth at bedtime.         . benzonatate (TESSALON) 100 MG capsule   Oral   Take 100 mg by mouth 3 (three) times daily as needed for cough.         . calcium acetate (PHOSLO) 667 MG capsule   Oral   Take 667 mg by mouth 3 (three) times daily with meals.         Marland Kitchen guaiFENesin (MUCINEX) 600 MG 12 hr tablet  Oral   Take 2 tablets (1,200 mg total) by mouth 2 (two) times daily.         . insulin lispro protamine-lispro (HUMALOG 75/25) (75-25) 100 UNIT/ML SUSP   Subcutaneous   Inject 5-10 Units into the skin 2 (two) times daily as needed.         . isosorbide mononitrate (IMDUR) 30 MG 24 hr tablet   Oral   Take 30 mg by mouth daily.         Marland Kitchen levothyroxine (SYNTHROID, LEVOTHROID) 75 MCG tablet   Oral   Take 75 mcg by mouth daily before breakfast.         . metoprolol tartrate (LOPRESSOR) 25 MG tablet   Oral   Take 1 tablet (25 mg total) by mouth 2 (two) times daily.   60 tablet   0   . oxyCODONE (OXY IR/ROXICODONE) 5 MG immediate release tablet   Oral   Take 1 tablet (5 mg total) by mouth every 4 (four) hours as needed for pain.   30 tablet   0   . simvastatin (ZOCOR) 10 MG tablet   Oral   Take 10 mg by mouth at bedtime.         . Vitamin D, Ergocalciferol, (DRISDOL) 50000 UNITS CAPS   Oral   Take 50,000 Units by mouth every 7  (seven) days. Takes on Fridays.         . vitamin E 400 UNIT capsule   Oral   Take 400 Units by mouth daily.         Marland Kitchen warfarin (COUMADIN) 5 MG tablet   Oral   Take 2.5 mg by mouth daily.           BP 138/70  Pulse 83  Temp(Src) 99.4 F (37.4 C) (Oral)  Resp 16  SpO2 99%  Physical Exam  Nursing note and vitals reviewed. Constitutional: She is oriented to person, place, and time. She appears well-developed and well-nourished. No distress.  HENT:  Head: Normocephalic and atraumatic.  Mouth/Throat: Oropharynx is clear and moist.  Eyes: EOM are normal. Pupils are equal, round, and reactive to light.  Neck: Normal range of motion. Neck supple.  Cardiovascular: Normal rate and regular rhythm.   Pulmonary/Chest: Effort normal and breath sounds normal. No respiratory distress. She has no wheezes. She has no rales.  Abdominal: Soft. Bowel sounds are normal. She exhibits no mass. There is no tenderness. There is no rebound and no guarding.  Musculoskeletal: Normal range of motion. She exhibits no edema and no tenderness.  Punctate site of bleeding from graft upper RUE  Neurological: She is alert and oriented to person, place, and time.  Skin: Skin is warm and dry. No rash noted. No erythema.  Psychiatric: She has a normal mood and affect. Her behavior is normal.    ED Course  Procedures (including critical care time)  Labs Reviewed  CBC WITH DIFFERENTIAL - Abnormal; Notable for the following:    WBC 11.0 (*)    RBC 3.10 (*)    Hemoglobin 9.4 (*)    HCT 29.1 (*)    RDW 16.4 (*)    Platelets 483 (*)    Neutro Abs 8.0 (*)    Monocytes Relative 13 (*)    Monocytes Absolute 1.4 (*)    All other components within normal limits  COMPREHENSIVE METABOLIC PANEL - Abnormal; Notable for the following:    Chloride 94 (*)    CO2 35 (*)    Glucose,  Bld 140 (*)    Creatinine, Ser 3.95 (*)    Calcium 7.9 (*)    Albumin 2.8 (*)    GFR calc non Af Amer 10 (*)    GFR calc Af Amer  11 (*)    All other components within normal limits  PROTIME-INR - Abnormal; Notable for the following:    Prothrombin Time 23.9 (*)    INR 2.25 (*)    All other components within normal limits  APTT - Abnormal; Notable for the following:    aPTT 68 (*)    All other components within normal limits   No results found.   1. Complication of vascular access for dialysis, initial encounter       MDM  Discussed with Dr Hart Rochester. Suggests direct pressure over site of bleeding for 25 min. If bleeding continues, call back and will place stitch. Pt remains otherwise asymptomatic and VS are stable.   Bleeding is now resolved. Will observe in ED and d/c if remains stable.       Loren Racer, MD 07/08/12 [phone]

## 2012-07-08 NOTE — ED Notes (Signed)
Pressure held x 1 hr at site. Combat gauze and quik clot placed over affected area. After pressure was held and materials applied bleeding stopped. Bleeding controlled. Pt A&O x4, V/S remained stable, and pt asymptomatic throughout the episode.

## 2012-07-08 NOTE — ED Notes (Addendum)
Pt reports to the ED via [facility] from dialysis center. Pt's fistula is continuously bleeding from the right arm. Pressure dressing in place. Unknown amount of blood loss. Pt denies any lightheadedness or dizziness. PT had full tx. Pt takes Coumadin. Last dose on Monday. V/S stable. Pt is A&O x4. CMS and full ROM intact in affected arm.

## 2012-07-17 ENCOUNTER — Ambulatory Visit (HOSPITAL_COMMUNITY)
Admission: RE | Admit: 2012-07-17 | Discharge: 2012-07-17 | Disposition: A | Discharge status: Home / Self Care | Payer: Medicare Other | Source: Ambulatory Visit | Attending: Physician Assistant | Admitting: Physician Assistant

## 2012-07-17 ENCOUNTER — Other Ambulatory Visit: Payer: Self-pay | Admitting: Physician Assistant

## 2012-07-17 DIAGNOSIS — E119 Type 2 diabetes mellitus without complications: Secondary | ICD-10-CM | POA: Insufficient documentation

## 2012-07-17 DIAGNOSIS — I700 Atherosclerosis of aorta: Secondary | ICD-10-CM | POA: Insufficient documentation

## 2012-07-17 DIAGNOSIS — R0602 Shortness of breath: Secondary | ICD-10-CM

## 2012-07-17 DIAGNOSIS — I509 Heart failure, unspecified: Secondary | ICD-10-CM | POA: Insufficient documentation

## 2012-07-17 DIAGNOSIS — R05 Cough: Secondary | ICD-10-CM | POA: Insufficient documentation

## 2012-07-17 DIAGNOSIS — J9819 Other pulmonary collapse: Secondary | ICD-10-CM | POA: Insufficient documentation

## 2012-07-17 DIAGNOSIS — I10 Essential (primary) hypertension: Secondary | ICD-10-CM | POA: Insufficient documentation

## 2012-07-17 DIAGNOSIS — I517 Cardiomegaly: Secondary | ICD-10-CM | POA: Insufficient documentation

## 2012-07-17 DIAGNOSIS — R059 Cough, unspecified: Secondary | ICD-10-CM | POA: Insufficient documentation

## 2012-07-17 DIAGNOSIS — J90 Pleural effusion, not elsewhere classified: Secondary | ICD-10-CM | POA: Insufficient documentation

## 2012-07-17 DIAGNOSIS — I2789 Other specified pulmonary heart diseases: Secondary | ICD-10-CM | POA: Insufficient documentation

## 2012-07-20 ENCOUNTER — Inpatient Hospital Stay (HOSPITAL_COMMUNITY)
Admission: AD | Admit: 2012-07-20 | Discharge: 2012-07-25 | DRG: 252 | Disposition: A | Discharge status: Home / Self Care | Payer: Medicare Other | Source: Ambulatory Visit | Attending: Internal Medicine | Admitting: Internal Medicine

## 2012-07-20 DIAGNOSIS — I472 Ventricular tachycardia, unspecified: Secondary | ICD-10-CM | POA: Diagnosis not present

## 2012-07-20 DIAGNOSIS — T82898A Other specified complication of vascular prosthetic devices, implants and grafts, initial encounter: Secondary | ICD-10-CM | POA: Diagnosis present

## 2012-07-20 DIAGNOSIS — Z79899 Other long term (current) drug therapy: Secondary | ICD-10-CM

## 2012-07-20 DIAGNOSIS — I4891 Unspecified atrial fibrillation: Secondary | ICD-10-CM | POA: Diagnosis present

## 2012-07-20 DIAGNOSIS — Z7982 Long term (current) use of aspirin: Secondary | ICD-10-CM

## 2012-07-20 DIAGNOSIS — I509 Heart failure, unspecified: Secondary | ICD-10-CM | POA: Diagnosis present

## 2012-07-20 DIAGNOSIS — F17200 Nicotine dependence, unspecified, uncomplicated: Secondary | ICD-10-CM | POA: Diagnosis present

## 2012-07-20 DIAGNOSIS — Z853 Personal history of malignant neoplasm of breast: Secondary | ICD-10-CM

## 2012-07-20 DIAGNOSIS — N2581 Secondary hyperparathyroidism of renal origin: Secondary | ICD-10-CM | POA: Diagnosis present

## 2012-07-20 DIAGNOSIS — Z794 Long term (current) use of insulin: Secondary | ICD-10-CM

## 2012-07-20 DIAGNOSIS — I079 Rheumatic tricuspid valve disease, unspecified: Secondary | ICD-10-CM | POA: Diagnosis present

## 2012-07-20 DIAGNOSIS — R791 Abnormal coagulation profile: Secondary | ICD-10-CM | POA: Diagnosis present

## 2012-07-20 DIAGNOSIS — I5043 Acute on chronic combined systolic (congestive) and diastolic (congestive) heart failure: Principal | ICD-10-CM | POA: Diagnosis present

## 2012-07-20 DIAGNOSIS — E785 Hyperlipidemia, unspecified: Secondary | ICD-10-CM | POA: Diagnosis present

## 2012-07-20 DIAGNOSIS — R0902 Hypoxemia: Secondary | ICD-10-CM | POA: Diagnosis present

## 2012-07-20 DIAGNOSIS — D649 Anemia, unspecified: Secondary | ICD-10-CM | POA: Diagnosis present

## 2012-07-20 DIAGNOSIS — Z7901 Long term (current) use of anticoagulants: Secondary | ICD-10-CM

## 2012-07-20 DIAGNOSIS — M129 Arthropathy, unspecified: Secondary | ICD-10-CM | POA: Diagnosis present

## 2012-07-20 DIAGNOSIS — M899 Disorder of bone, unspecified: Secondary | ICD-10-CM | POA: Diagnosis present

## 2012-07-20 DIAGNOSIS — I4729 Other ventricular tachycardia: Secondary | ICD-10-CM | POA: Diagnosis not present

## 2012-07-20 DIAGNOSIS — N186 End stage renal disease: Secondary | ICD-10-CM | POA: Diagnosis present

## 2012-07-20 DIAGNOSIS — C50919 Malignant neoplasm of unspecified site of unspecified female breast: Secondary | ICD-10-CM | POA: Diagnosis present

## 2012-07-20 DIAGNOSIS — E119 Type 2 diabetes mellitus without complications: Secondary | ICD-10-CM | POA: Diagnosis present

## 2012-07-20 DIAGNOSIS — I359 Nonrheumatic aortic valve disorder, unspecified: Secondary | ICD-10-CM | POA: Diagnosis present

## 2012-07-20 DIAGNOSIS — I5032 Chronic diastolic (congestive) heart failure: Secondary | ICD-10-CM | POA: Diagnosis present

## 2012-07-20 DIAGNOSIS — Y832 Surgical operation with anastomosis, bypass or graft as the cause of abnormal reaction of the patient, or of later complication, without mention of misadventure at the time of the procedure: Secondary | ICD-10-CM | POA: Diagnosis present

## 2012-07-20 DIAGNOSIS — I120 Hypertensive chronic kidney disease with stage 5 chronic kidney disease or end stage renal disease: Secondary | ICD-10-CM | POA: Diagnosis present

## 2012-07-20 DIAGNOSIS — I10 Essential (primary) hypertension: Secondary | ICD-10-CM | POA: Diagnosis present

## 2012-07-20 DIAGNOSIS — E039 Hypothyroidism, unspecified: Secondary | ICD-10-CM | POA: Diagnosis present

## 2012-07-20 HISTORY — DX: Shortness of breath: R06.02

## 2012-07-20 LAB — CBC WITH DIFFERENTIAL/PLATELET
Basophils Absolute: 0.1 10*3/uL (ref 0.0–0.1)
Eosinophils Relative: 3 % (ref 0–5)
Lymphocytes Relative: 15 % (ref 12–46)
Lymphs Abs: 1.5 10*3/uL (ref 0.7–4.0)
Neutro Abs: 7.3 10*3/uL (ref 1.7–7.7)
Neutrophils Relative %: 71 % (ref 43–77)
Platelets: 421 10*3/uL — ABNORMAL HIGH (ref 150–400)
RBC: 2.74 MIL/uL — ABNORMAL LOW (ref 3.87–5.11)
RDW: 17.7 % — ABNORMAL HIGH (ref 11.5–15.5)
WBC: 10.2 10*3/uL (ref 4.0–10.5)

## 2012-07-20 LAB — BASIC METABOLIC PANEL
CO2: 28 mEq/L (ref 19–32)
Chloride: 98 mEq/L (ref 96–112)
Creatinine, Ser: 9.22 mg/dL — ABNORMAL HIGH (ref 0.50–1.10)
GFR calc Af Amer: 4 mL/min — ABNORMAL LOW (ref >90)
Potassium: 3.7 mEq/L (ref 3.5–5.1)

## 2012-07-20 LAB — PROTIME-INR
INR: 1.23 (ref 0.00–1.49)
Prothrombin Time: 15.3 seconds — ABNORMAL HIGH (ref 11.6–15.2)

## 2012-07-20 LAB — GLUCOSE, CAPILLARY: Glucose-Capillary: 142 mg/dL — ABNORMAL HIGH (ref 70–99)

## 2012-07-20 LAB — PRO B NATRIURETIC PEPTIDE: Pro B Natriuretic peptide (BNP): 66717 pg/mL — ABNORMAL HIGH (ref 0–450)

## 2012-07-20 MED ORDER — SODIUM CHLORIDE 0.9 % IJ SOLN: 3.0000 mL | INTRAMUSCULAR | Status: DC | PRN

## 2012-07-20 MED ORDER — ACETAMINOPHEN 325 MG PO TABS: 650.0000 mg | ORAL_TABLET | ORAL | Status: DC | PRN

## 2012-07-20 MED ORDER — SODIUM CHLORIDE 0.9 % IJ SOLN: 3.0000 mL | Freq: Two times a day (BID) | INTRAMUSCULAR | Status: DC

## 2012-07-20 MED ORDER — WARFARIN - PHYSICIAN DOSING INPATIENT
Freq: Every day | Status: DC
  Administered 2012-07-24: 18:00:00

## 2012-07-20 MED ORDER — ONDANSETRON HCL 4 MG/2ML IJ SOLN: 4.0000 mg | Freq: Four times a day (QID) | INTRAMUSCULAR | Status: DC | PRN

## 2012-07-20 MED ORDER — NEBIVOLOL HCL 2.5 MG PO TABS
2.5000 mg | ORAL_TABLET | Freq: Every day | ORAL | Status: DC
  Administered 2012-07-20: 2.5 mg via ORAL
  Filled 2012-07-20 (×2): qty 1

## 2012-07-20 MED ORDER — SODIUM CHLORIDE 0.9 % IV SOLN
62.5000 mg | INTRAVENOUS | Status: DC
  Administered 2012-07-22: 62.5 mg via INTRAVENOUS
  Filled 2012-07-20 (×2): qty 5

## 2012-07-20 MED ORDER — SODIUM CHLORIDE 0.9 % IV SOLN: 250.0000 mL | INTRAVENOUS | Status: DC | PRN

## 2012-07-20 MED ORDER — SIMVASTATIN 10 MG PO TABS
10.0000 mg | ORAL_TABLET | Freq: Every day | ORAL | Status: DC
  Administered 2012-07-20 – 2012-07-23 (×4): 10 mg via ORAL
  Filled 2012-07-20 (×6): qty 1

## 2012-07-20 MED ORDER — ISOSORBIDE MONONITRATE ER 30 MG PO TB24
30.0000 mg | ORAL_TABLET | Freq: Every day | ORAL | Status: DC
  Administered 2012-07-20 – 2012-07-23 (×4): 30 mg via ORAL
  Filled 2012-07-20 (×4): qty 1

## 2012-07-20 MED ORDER — RENA-VITE PO TABS
1.0000 | ORAL_TABLET | Freq: Every day | ORAL | Status: DC
  Administered 2012-07-20 – 2012-07-24 (×4): 1 via ORAL
  Filled 2012-07-20 (×6): qty 1

## 2012-07-20 MED ORDER — WARFARIN SODIUM 2.5 MG PO TABS
2.5000 mg | ORAL_TABLET | ORAL | Status: DC
  Filled 2012-07-20: qty 1

## 2012-07-20 MED ORDER — CALCIUM ACETATE 667 MG PO CAPS
667.0000 mg | ORAL_CAPSULE | Freq: Three times a day (TID) | ORAL | Status: DC
  Administered 2012-07-21 – 2012-07-25 (×11): 667 mg via ORAL
  Filled 2012-07-20 (×17): qty 1

## 2012-07-20 MED ORDER — DARBEPOETIN ALFA-POLYSORBATE 100 MCG/0.5ML IJ SOLN
100.0000 ug | INTRAMUSCULAR | Status: DC
  Administered 2012-07-22: 100 ug via INTRAVENOUS
  Filled 2012-07-20: qty 0.5

## 2012-07-20 MED ORDER — LEVOTHYROXINE SODIUM 75 MCG PO TABS
75.0000 ug | ORAL_TABLET | Freq: Every day | ORAL | Status: DC
  Administered 2012-07-21 – 2012-07-25 (×5): 75 ug via ORAL
  Filled 2012-07-20 (×7): qty 1

## 2012-07-20 MED ORDER — DOXERCALCIFEROL 4 MCG/2ML IV SOLN
3.0000 ug | INTRAVENOUS | Status: DC
Start: 2012-07-22 — End: 2012-07-25
  Administered 2012-07-22: 3 ug via INTRAVENOUS
  Filled 2012-07-20 (×2): qty 2

## 2012-07-20 MED ORDER — VITAMIN D (ERGOCALCIFEROL) 1.25 MG (50000 UNIT) PO CAPS
50000.0000 [IU] | ORAL_CAPSULE | ORAL | Status: DC
  Filled 2012-07-20: qty 1

## 2012-07-20 MED ORDER — WARFARIN 1.25 MG HALF TABLET: 1.2500 mg | ORAL_TABLET | Freq: Every day | ORAL | Status: DC

## 2012-07-20 MED ORDER — VITAMIN E 180 MG (400 UNIT) PO CAPS
400.0000 [IU] | ORAL_CAPSULE | Freq: Every day | ORAL | Status: DC
  Administered 2012-07-20 – 2012-07-25 (×5): 400 [IU] via ORAL
  Filled 2012-07-20 (×6): qty 1

## 2012-07-20 MED ORDER — WARFARIN 1.25 MG HALF TABLET
1.2500 mg | ORAL_TABLET | ORAL | Status: DC
  Administered 2012-07-20: 1.25 mg via ORAL
  Filled 2012-07-20 (×3): qty 1

## 2012-07-20 NOTE — Progress Notes (Signed)
Pt has dialysis graft in right arm and history of mastectomy in left arm. PA called to see if patient can go without IV access. Order to hold off on IV access for now. Patient to go to dialysis tonight. [name]

## 2012-07-20 NOTE — Progress Notes (Signed)
Hemodialysis Tx complete. Not able to meet goal due to cramping. Pts BP ran high during HD Tx. Has BP meds due when she gets back to her room.

## 2012-07-20 NOTE — H&P (Signed)
[facility]            ADMISSION HISTORY & PHYSICAL   Chief Complaint:  Dyspnea, a-fib  Cardiologist: Skylan Gift  Primary Care Physician: Rene Paci, MD  HPI:  This is a 79 y.o. female who lives in [place] (but is staying in [place] with her daughter) with a past medical history significant for HTN, T2DM, CKD (HD: M,W,F), breast cancer, s/p mastectomy, and hypothyroidism. 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 She was followed by our practice during an admission for pulmonary edema, secondary to hypertensive emergency, in June 2011. An echo at that time revealed normal systolic function with an EF of 60-65%. No WMA. Mild aortic regurgitation, mild-moderated tricuspid regurgitation and moderately increased pulmonary pressures. Unfortunately, she was lost to follow-up. She presented to the [facility] ER on 06/24/12 for acute onset of SOB, which she contributes to too much fluid removal from dialysis. She reports that up until yesterday, she was in her usual state of health. She reports that this has never happened before. She also endorses associated lightheadedness and dizziness. No syncope/presyncope. No palpitations or diaphoresis. She denies chest pain, but reports feeling a heaviness in her chest 2 days ago. It was substernal and self-limitting. She endorsed belching. She felt it was indigestion. No chest discomfort since then. She denies SOB prior to yesterday. No LEE, PND or orthopnea. No fever, chills, n/v, hematochezia, melana or hematuria. Work-up in the ED revealed that she was in a-fib with RVR. She was admitted by [facility]. Her HR was controlled with IV BB. She then converted to NSR. She was seen in follow-up on 07/17/12 in the office by Wilburt Finlay, PA-C.  He felt that she may be having an acute exacerbation of CHF.  A CXR was ordered, she was given a z-pack and a BNP was ordered. The CXR revealed minimial CHF, bilateral pleural effusions and  atelectasis.  Per the patient's report, she has had worsening non-productive cough now with the development of whitish sputum, not improved with antibiotics.  She endorses orthopnea, PND and worsening dyspnea on exertion.  She has not missed dialysis and is scheduled for dialysis today.  She also had a bleeding complication at recent dialysis - INR was 3.2 at the time and she is getting heparin with dialysis additionally.    PMHx:  Past Medical History  Diagnosis Date  . CHF (congestive heart failure)   . DM2 (diabetes mellitus, type 2)   . ESRD (end stage renal disease)     HD MWF - [facility]  . HTN (hypertension)   . Hyperlipidemia   . Tibia/fibula fracture 07/10/2011    right - nonunion repair 06/2012  . Hypothyroid   . Breast cancer   . Arthritis   . Hyperparathyroidism, secondary renal   . Atrial fibrillation dx 06/2012    on coumadin    Past Surgical History  Procedure Laterality Date  . Mastectomy    . Abdominal surgery    . Thyroid surgery    . Breast surgery Left     Mastectomy  . Eye surgery Bilateral     Cataract  . Ankle fusion Right   . Arteriovenous graft placement Right   . Orif tibia fracture Right 06/16/2012    Procedure: OPEN REDUCTION INTERNAL FIXATION (ORIF) TIBIA FRACTURE;  Surgeon: Budd Palmer, MD;  Location: [facility] OR;  Service: Orthopedics;  Laterality: Right;    FAMHx:  Family History  Problem Relation Age  of Onset  . Hyperlipidemia Father   . Diabetes Father   . Breast cancer Sister     SOCHx:   reports that she has never smoked. Her smokeless tobacco use includes Snuff. She reports that she does not drink alcohol or use illicit drugs.  ALLERGIES:  Allergies  Allergen Reactions  . Codeine Swelling    ROS: A comprehensive review of systems was negative except for: Constitutional: positive for fatigue Respiratory: positive for cough and dyspnea on exertion Cardiovascular: positive for dyspnea, irregular heart beat, orthopnea and paroxysmal  nocturnal dyspnea Genitourinary: positive for dysuria Hematologic/lymphatic: positive for bleeding Endocrine: positive for diabetic symptoms including increased fatigue and polydipsia  HOME MEDS: No current facility-administered medications for this encounter. Current outpatient prescriptions:acetaminophen (TYLENOL) 650 MG CR tablet, Take 650 mg by mouth every 8 (eight) hours as needed for pain., Disp: , Rfl: ;  aspirin EC 81 MG tablet, Take 81 mg by mouth daily., Disp: , Rfl: ;  b complex-vitamin c-folic acid (NEPHRO-VITE) 0.8 MG TABS, Take 0.8 mg by mouth at bedtime., Disp: , Rfl: ;  benzonatate (TESSALON) 100 MG capsule, Take 100 mg by mouth 3 (three) times daily as needed for cough., Disp: , Rfl:  calcium acetate (PHOSLO) 667 MG capsule, Take 667 mg by mouth 3 (three) times daily with meals., Disp: , Rfl: ;  guaiFENesin (MUCINEX) 600 MG 12 hr tablet, Take 2 tablets (1,200 mg total) by mouth 2 (two) times daily., Disp: , Rfl: ;  insulin lispro protamine-lispro (HUMALOG 75/25) (75-25) 100 UNIT/ML SUSP, Inject 5-10 Units into the skin 2 (two) times daily as needed., Disp: , Rfl:  isosorbide mononitrate (IMDUR) 30 MG 24 hr tablet, Take 30 mg by mouth daily., Disp: , Rfl: ;  levothyroxine (SYNTHROID, LEVOTHROID) 75 MCG tablet, Take 75 mcg by mouth daily before breakfast., Disp: , Rfl: ;  metoprolol tartrate (LOPRESSOR) 25 MG tablet, Take 1 tablet (25 mg total) by mouth 2 (two) times daily., Disp: 60 tablet, Rfl: 0 oxyCODONE (OXY IR/ROXICODONE) 5 MG immediate release tablet, Take 1 tablet (5 mg total) by mouth every 4 (four) hours as needed for pain., Disp: 30 tablet, Rfl: 0;  simvastatin (ZOCOR) 10 MG tablet, Take 10 mg by mouth at bedtime., Disp: , Rfl: ;  Vitamin D, Ergocalciferol, (DRISDOL) 50000 UNITS CAPS, Take 50,000 Units by mouth every 7 (seven) days. Takes on Fridays., Disp: , Rfl:  vitamin E 400 UNIT capsule, Take 400 Units by mouth daily., Disp: , Rfl: ;  warfarin (COUMADIN) 5 MG tablet, Take  2.5 mg by mouth daily., Disp: , Rfl:   LABS/IMAGING: No results found for this or any previous visit (from the past 48 hour(s)). No results found.  VITALS: Wt. Not obtained (wheelchair), P 76, BP 148/70, Ht. 5'2", BMI 26%  EXAM: General appearance: alert, no distress and coughing Neck: JVD - 4 cm above sternal notch, no adenopathy, no carotid bruit, supple, symmetrical, trachea midline and thyroid not enlarged, symmetric, no tenderness/mass/nodules Lungs: dullness to percussion bibasilar Heart: regular rate and rhythm, S1, S2 normal, no murmur, click, rub or gallop Abdomen: soft, non-tender, obese, +BS Extremities: edema trace to 1+, RUE fistula with palpable thrill Pulses: 2+ and symmetric Skin: Skin color, texture, turgor normal. No rashes or lesions Neurologic: Grossly normal  IMPRESSION: 1. Principal Problem: 2.   Acute on chronic systolic and diastolic heart failure, NYHA class 4 3. Active Problems: 4.   ESRD (end stage renal disease) 5.   HTN (hypertension) 6.   DM2 (diabetes  mellitus, type 2) 7.   Hyperlipidemia 8.   Atrial fibrillation, spont convers NSR 06/26/12 9.   Diastolic CHF, chronic 10.   Pleural effusion due to CHF (congestive heart failure) 11.   PLAN: 1. Mrs. Wilcher is in decompensated heart failure, she has persistent non-productive or mildly productive cough with whitish sputum, bilateral pleural effusions on x-ray and elevated BNP (however, on dialysis).  I feel she needs to be admitted for additional ultrafiltration and medication adjustment. I would stop Aspirin (due to significant bleeding on warfarin during recent dialysis).  We discussed possibly stopping warfarin, however, her stroke risk is 14% annually (CHADS2VASC score 6). Her bleeding at dialysis may in part be due to concomitant aspirin, mildly supratherapeutic INR (3.2) and being administered heparin with dialysis.  Dr. Lowell Guitar is her nephrologist and it would be good to get his opinion as  well.  Chrystie Nose, MD, [facility] Attending Cardiologist [facility]  Alvetta Hidrogo C 07/20/2012, 10:29 AM

## 2012-07-20 NOTE — Consult Note (Signed)
[facility] Renal Consultation Note  Indication for Consultation:  Management of ESRD/hemodialysis; anemia, hypertension/volume and secondary hyperparathyroidism  HPI: [name] is a 79 y.o. female who lives in [place] (but is staying in [place] with her daughter S/p 07/09/12 ORIF of right tibia secondary to non-union tibia fx.r) with a past medical history ESRD ( [facility]) and HTN, T2DM,breast cancer, s/p mastectomy on left atrial Fib on Coumadin,followed by [facility] during an admission for pulmonary edema, secondary to hypertensive emergency, in June 2011.with  echo showing normal systolic function with an EF of 60-65%. No WMA. Mild aortic regurgitation, mild-moderated tricuspid regurgitation and moderately increased pulmonary pressures. Unfortunately, she was lost to follow-up. She presented to [facility] ER co sob, tachycardia and coughing up white foamy dc with orthopnea past several days.Admitted with decompensated heart failure with  bilateral pleural effusions on x-ray and elevated BNP (however, on dialysis).  Noted Cardiology feels she "needs additional ultrafiltration and medication adjustment." She recently had her AVF bleed post hd  With her INR 3.2 and Cardiology feels ASA can be DC. She has not missed any HD.         Past Medical History  Diagnosis Date  . CHF (congestive heart failure)   . DM2 (diabetes mellitus, type 2)   . ESRD (end stage renal disease)     HD MWF - [facility]  . HTN (hypertension)   . Hyperlipidemia   . Tibia/fibula fracture 07/10/2011    right - nonunion repair 06/2012  . Hypothyroid   . Breast cancer   . Arthritis   . Hyperparathyroidism, secondary renal   . Atrial fibrillation dx 06/2012    on coumadin    Past Surgical History  Procedure Laterality Date  . Mastectomy    . Abdominal surgery    . Thyroid surgery    . Breast surgery Left     Mastectomy  . Eye surgery Bilateral     Cataract  . Ankle  fusion Right   . Arteriovenous graft placement Right   . Orif tibia fracture Right 06/16/2012    Procedure: OPEN REDUCTION INTERNAL FIXATION (ORIF) TIBIA FRACTURE;  Surgeon: Budd Palmer, MD;  Location: [facility] OR;  Service: Orthopedics;  Laterality: Right;      Family History  Problem Relation Age of Onset  . Hyperlipidemia Father   . Diabetes Father   . Breast cancer Sister       reports that she has never smoked. Her smokeless tobacco use includes Snuff. She reports that she does not drink alcohol or use illicit drugs.   Allergies  Allergen Reactions  . Codeine Swelling    Prior to Admission medications   Medication Sig Start Date End Date Taking? Authorizing Provider  acetaminophen (TYLENOL) 650 MG CR tablet Take 650 mg by mouth every 8 (eight) hours as needed for pain.   Yes Historical Provider, MD  b complex-vitamin c-folic acid (NEPHRO-VITE) 0.8 MG TABS Take 0.8 mg by mouth at bedtime.   Yes Historical Provider, MD  calcium acetate (PHOSLO) 667 MG capsule Take 667 mg by mouth 3 (three) times daily with meals.   Yes Historical Provider, MD  isosorbide mononitrate (IMDUR) 30 MG 24 hr tablet Take 30 mg by mouth daily.   Yes Historical Provider, MD  levothyroxine (SYNTHROID, LEVOTHROID) 75 MCG tablet Take 75 mcg by mouth daily before breakfast.   Yes Historical Provider, MD  nebivolol (BYSTOLIC) 2.5 MG tablet Take 2.5 mg by mouth daily.  Yes Historical Provider, MD  oxyCODONE (OXY IR/ROXICODONE) 5 MG immediate release tablet Take 1 tablet (5 mg total) by mouth every 4 (four) hours as needed for pain. 06/26/12  Yes Sorin Luanne Bras, MD  simvastatin (ZOCOR) 10 MG tablet Take 10 mg by mouth at bedtime.   Yes Historical Provider, MD  Vitamin D, Ergocalciferol, (DRISDOL) 50000 UNITS CAPS Take 50,000 Units by mouth every 7 (seven) days. Takes on Fridays.   Yes Historical Provider, MD  vitamin E 400 UNIT capsule Take 400 Units by mouth daily.   Yes Historical Provider, MD  insulin lispro  protamine-lispro (HUMALOG 75/25) (75-25) 100 UNIT/ML SUSP Inject 5-10 Units into the skin 2 (two) times daily as needed.    Historical Provider, MD  warfarin (COUMADIN) 5 MG tablet Take 1.25-2.5 mg by mouth daily. 1.25 mg alternating with 2.5 mg daily    Historical Provider, MD     Anti-infectives   None      Results for orders placed during the hospital encounter of 07/20/12 (from the past 48 hour(s))  GLUCOSE, CAPILLARY     Status: Abnormal   Collection Time    07/20/12 12:24 PM      Result Value Range   Glucose-Capillary 142 (*) 70 - 99 mg/dL   Comment 1 Notify RN       ROS: As above and noted was seen in follow-up on 07/17/12 in the office by [facility]  Wilburt Finlay, PA-C. He felt that she may be having an acute exacerbation of CHF. A CXR was ordered, she was given a z-pack and a BNP was ordered. The CXR revealed minimial CHF, bilateral pleural effusions and atelectasis.  Denies any other excess bleeding , very little urine production. No c,f,swts. No chest pain.  Physical Exam: Filed Vitals:   07/20/12 1414  BP: 220/88  Pulse: 80  Temp: 98.6 F (37 C)  Resp: 16     General: Alert pleasant elderly BF NAD but tells me she can not lay down flat because of SOB HEENT: [place], MMM,  Eyes: eomi Neck:  Supple , pos. jvd Heart: RRR,  2/6 sem apex, no rub Lungs: faint bilat rales Abdomen:  bs pos. Soft, nontender Extremities: 2+ right pedal edema, 1+ left pedal edema Skin: Healed Right anle surgical sight. No overt rash or ulcer Neuro: alert. Ox3, no acute focal deficits noted Dialysis Access: Pos. Bruit Right U A AVF with mld swelling mid arm   Dialysis Orders: Center: [place]  on MWF . EDW 66kg HD Bath 2.0 k. 2.25 Ca  Time 3hrs Heparin 2000units. Access  Right upper arm AVF BFR 325 DFR 800    Hectoral mcg IV/HD Epogen 4400   Units IV/HD  Venofer  50mg  weekly  Other 0  Assessment/Plan 1. Volume overload with Acute / Chronic Systolic and Diastolic Heart Failure in ESRD  Pt. = Hd today  Attempt to uf with vol . Off and slowly lower her edw/ dw her need to increase HD time to 4 hrs with her Cardiac condition to help prevent BP drops on hd 2. ESRD -  [facility]WF [facility]/   NEEDS INCREASED HD TIME TO 4 hrs secondary to Cardiac condition  As above.( I dw pt and daughter in room) 3. Hypertension/volume  - UF on hd and meds as needed 4. Atrial Fib with Rapid Vent  Rate on admit = per Card.  On Coumadin and now in Sinus 5. Anemia  - hgb =9.4 aranesp q wed  hd and weekly hd venofer 6. Metabolic bone disease -  Binders / hectoral on hd   3.3 phos 7. Nutrition - renal diet and renavite  8.  DM type 2 = Per admit/ on diet  And Humalog  As op  Lenny Pastel, PA-C [facility] Beeper [phone] 07/20/2012, 3:07 PM   Patient seen and examined.  Agree with assessment and plan as above.  79 yo pt with DM, HTN afib and ESRD on hemodialysis.  Pt admitted with SOB and pulm edema. Plan HD tonight, remove volume, may need to lower dry weight. Will follow Vinson Moselle  MD [phone] pgr    [phone] cell 07/20/2012, 4:23 PM

## 2012-07-20 NOTE — Progress Notes (Signed)
Utilization Review Completed.Nimah Uphoff T5/07/2012  

## 2012-07-21 ENCOUNTER — Observation Stay (HOSPITAL_COMMUNITY): Payer: Medicare Other

## 2012-07-21 LAB — BASIC METABOLIC PANEL
BUN: 17 mg/dL (ref 6–23)
CO2: 27 mEq/L (ref 19–32)
Calcium: 9.1 mg/dL (ref 8.4–10.5)
Chloride: 95 mEq/L — ABNORMAL LOW (ref 96–112)
Creatinine, Ser: 5.32 mg/dL — ABNORMAL HIGH (ref 0.50–1.10)
GFR calc Af Amer: 8 mL/min — ABNORMAL LOW (ref >90)
GFR calc non Af Amer: 7 mL/min — ABNORMAL LOW (ref >90)
Glucose, Bld: 204 mg/dL — ABNORMAL HIGH (ref 70–99)
Potassium: 4.3 mEq/L (ref 3.5–5.1)
Sodium: 136 mEq/L (ref 135–145)

## 2012-07-21 LAB — HEMOGLOBIN AND HEMATOCRIT, BLOOD
HCT: 26.3 % — ABNORMAL LOW (ref 36.0–46.0)
Hemoglobin: 8.3 g/dL — ABNORMAL LOW (ref 12.0–15.0)

## 2012-07-21 LAB — PROTIME-INR
INR: 1.17 (ref 0.00–1.49)
Prothrombin Time: 14.7 seconds (ref 11.6–15.2)

## 2012-07-21 LAB — GLUCOSE, CAPILLARY
Glucose-Capillary: 129 mg/dL — ABNORMAL HIGH (ref 70–99)
Glucose-Capillary: 149 mg/dL — ABNORMAL HIGH (ref 70–99)
Glucose-Capillary: 139 mg/dL — ABNORMAL HIGH (ref 70–99)

## 2012-07-21 LAB — HEPATITIS B SURFACE ANTIGEN: Hepatitis B Surface Ag: NEGATIVE

## 2012-07-21 MED ORDER — WARFARIN SODIUM 3 MG PO TABS
3.0000 mg | ORAL_TABLET | Freq: Once | ORAL | Status: AC
  Administered 2012-07-21: 3 mg via ORAL
  Filled 2012-07-21: qty 1

## 2012-07-21 MED ORDER — NEBIVOLOL HCL 5 MG PO TABS
5.0000 mg | ORAL_TABLET | Freq: Every day | ORAL | Status: DC
  Administered 2012-07-21 – 2012-07-22 (×2): 5 mg via ORAL
  Filled 2012-07-21 (×2): qty 1

## 2012-07-21 NOTE — Progress Notes (Signed)
ANTICOAGULATION CONSULT NOTE - Initial Consult  Pharmacy Consult for Coumadin Indication: atrial fibrillation  Allergies  Allergen Reactions  . Codeine Swelling    Patient Measurements: Height: 5\' 2"  (157.5 cm) Weight: 141 lb 15.6 oz (64.4 kg) IBW/kg (Calculated) : 50.1   Vital Signs: Temp: 98.6 F (37 C) (05/06 1332) Temp src: Oral (05/06 1332) BP: 160/75 mmHg (05/06 1332) Pulse Rate: 76 (05/06 1332)  Labs:  Recent Labs  07/20/12 1701 07/21/12 1351  HGB 8.3* 8.3*  HCT 25.3* 26.3*  PLT 421*  --   LABPROT 15.3* 14.7  INR 1.23 1.17  CREATININE 9.22* 5.32*    Estimated Creatinine Clearance: 7.3 ml/min (by C-G formula based on Cr of 5.32).   Medical History: Past Medical History  Diagnosis Date  . CHF (congestive heart failure)   . DM2 (diabetes mellitus, type 2)   . ESRD (end stage renal disease)     HD MWF - [facility]  . HTN (hypertension)   . Hyperlipidemia   . Tibia/fibula fracture 07/10/2011    right - nonunion repair 06/2012  . Hypothyroid   . Breast cancer   . Arthritis   . Hyperparathyroidism, secondary renal   . Atrial fibrillation dx 06/2012    on coumadin   Assessment: On Coumadin PTA for Afib.  Patient reports that her dose was recently changed due to bleeding at HD.  Her latest PTA dose was 2.5 mg alternating with 1.25 mg per her report.  She reports that she has not had any Coumadin since Saturday.  INR now 1.17  Goal of Therapy:  INR = 2 Monitor platelets by anticoagulation protocol: Yes   Plan:  1) Coumadin 3 mg po x 1 dose at 1800 pm 2) Daily INR  Thank you. Okey Regal, PharmD [phone]  07/21/2012,3:30 PM

## 2012-07-21 NOTE — Progress Notes (Signed)
Subjective: Breathing better, only 1.8kg yest with HD due to cramping. BP was high the whole treatment  Objective Vital signs in last 24 hours: Filed Vitals:   07/20/12 2110 07/20/12 2120 07/20/12 2229 07/21/12 0436  BP: 203/86 210/79 162/55 148/85  Pulse: 74 81 79 72  Temp:  98.2 F (36.8 C) 98.6 F (37 C) 98.2 F (36.8 C)  TempSrc:  Oral Oral Oral  Resp: 28 18 20 18   Height:      Weight: 64.2 kg (141 lb 8.6 oz)   64.4 kg (141 lb 15.6 oz)  SpO2: 99%  100% 97%   Weight change:   Intake/Output Summary (Last 24 hours) at 07/21/12 1209 Last data filed at 07/20/12 2110  Gross per 24 hour  Intake      0 ml  Output   1953 ml  Net  -1953 ml   Labs: Basic Metabolic Panel:  Recent Labs Lab 07/20/12 1701  NA 142  K 3.7  CL 98  CO2 28  GLUCOSE 116*  BUN 35*  CREATININE 9.22*  CALCIUM 9.2   Liver Function Tests: No results found for this basename: AST, ALT, ALKPHOS, BILITOT, PROT, ALBUMIN,  in the last 168 hours No results found for this basename: LIPASE, AMYLASE,  in the last 168 hours No results found for this basename: AMMONIA,  in the last 168 hours CBC:  Recent Labs Lab 07/20/12 1701  WBC 10.2  NEUTROABS 7.3  HGB 8.3*  HCT 25.3*  MCV 92.3  PLT 421*   PT/INR: @LABRCNTIP (inr:5)   Scheduled Meds ) . calcium acetate  667 mg Oral TID WC  . [START ON 07/22/2012] darbepoetin (ARANESP) injection - DIALYSIS  100 mcg Intravenous Q Wed-HD  . [START ON 07/22/2012] doxercalciferol  3 mcg Intravenous Q M,W,F-HD  . [START ON 07/22/2012] ferric gluconate (FERRLECIT/NULECIT) IV  62.5 mg Intravenous Q Wed-HD  . isosorbide mononitrate  30 mg Oral Daily  . levothyroxine  75 mcg Oral QAC breakfast  . multivitamin  1 tablet Oral QHS  . nebivolol  5 mg Oral Daily  . simvastatin  10 mg Oral q1800  . sodium chloride  3 mL Intravenous Q12H  . [START ON 07/24/2012] Vitamin D (Ergocalciferol)  50,000 Units Oral Q Fri  . vitamin E  400 Units Oral Daily  . warfarin  1.25 mg Oral Custom   . warfarin  2.5 mg Oral Custom  . Warfarin - Physician Dosing Inpatient   Does not apply q1800    Physical Exam:  Blood pressure 148/85, pulse 72, temperature 98.2 F (36.8 C), temperature source Oral, resp. rate 18, height 5\' 2"  (1.575 m), weight 64.4 kg (141 lb 15.6 oz), SpO2 97.00%.  General: Alert pleasant elderly BF NAD but tells me she can not lay down flat because of SOB  HEENT: , MMM,  Eyes: eomi  Neck: Supple , pos. jvd  Heart: RRR, 2/6 sem apex, no rub  Lungs: faint bilat rales  Abdomen: bs pos. Soft, nontender  Extremities: 2+ right pedal edema, 1+ left pedal edema  Skin: Healed Right anle surgical sight. No overt rash or ulcer  Neuro: alert. Ox3, no acute focal deficits noted  Dialysis Access: Pos. Bruit Right U A AVF with mld swelling mid arm   Dialysis Orders: [facility] MWF 66kg  2K, 2.25Ca bath 3hrs Heparin 2000 RUA AVF BFR 325 Hectorol Epogen 4400 Venofer 50mg  weekly  Assessment/Plan  1. SOB/mild pulm edema- below dry wt but didn't tolerate much UF due to  cramping yesterday on dialysis. Will try further UF again tomorrow, repeat film today. 2. ESRD, cont HD MWF. May need increased time to 4hr with HD 3. Hypertension/volume - BP was high, better today. On bystolic only at home, ^'d to 5 mg/ day here 4. Atrial Fib with Rapid Vent Rate on admit = per Card. On Coumadin and now in Sinus 5. Anemia - hgb =9.4 aranesp q wed hd and weekly hd venofer 6. Metabolic bone disease - Binders / hectoral on hd 3.3 phos 7. Nutrition - renal diet and renavite  8. DM type 2 = Per admit/ on diet And Humalog As Chipper Herb  MD [phone] pgr    [phone] cell 07/21/2012, 12:09 PM

## 2012-07-21 NOTE — Progress Notes (Deleted)
ANTICOAGULATION CONSULT NOTE - Follow Up Consult  Pharmacy Consult for pharmacy Indication: atrial fibrillation  Allergies  Allergen Reactions  . Codeine Swelling    Patient Measurements: Height: 5\' 2"  (157.5 cm) Weight: 141 lb 15.6 oz (64.4 kg) IBW/kg (Calculated) : 50.1   Vital Signs: Temp: 98.6 F (37 C) (05/06 1332) Temp src: Oral (05/06 1332) BP: 160/75 mmHg (05/06 1332) Pulse Rate: 76 (05/06 1332)  Labs:  Recent Labs  07/20/12 1701 07/21/12 1351  HGB 8.3* 8.3*  HCT 25.3* 26.3*  PLT 421*  --   LABPROT 15.3* 14.7  INR 1.23 1.17  CREATININE 9.22* 5.32*    Estimated Creatinine Clearance: 7.3 ml/min (by C-G formula based on Cr of 5.32).   Medications:  Scheduled:  . calcium acetate  667 mg Oral TID WC  . [START ON 07/22/2012] darbepoetin (ARANESP) injection - DIALYSIS  100 mcg Intravenous Q Wed-HD  . [START ON 07/22/2012] doxercalciferol  3 mcg Intravenous Q M,W,F-HD  . [START ON 07/22/2012] ferric gluconate (FERRLECIT/NULECIT) IV  62.5 mg Intravenous Q Wed-HD  . isosorbide mononitrate  30 mg Oral Daily  . levothyroxine  75 mcg Oral QAC breakfast  . multivitamin  1 tablet Oral QHS  . nebivolol  5 mg Oral Daily  . simvastatin  10 mg Oral q1800  . sodium chloride  3 mL Intravenous Q12H  . [START ON 07/24/2012] Vitamin D (Ergocalciferol)  50,000 Units Oral Q Fri  . vitamin E  400 Units Oral Daily  . warfarin  3 mg Oral ONCE-1800  . Warfarin - Physician Dosing Inpatient   Does not apply q1800  . [DISCONTINUED] nebivolol  2.5 mg Oral Daily  . [DISCONTINUED] warfarin  1.25 mg Oral Custom  . [DISCONTINUED] warfarin  1.25-2.5 mg Oral Daily  . [DISCONTINUED] warfarin  2.5 mg Oral Custom    Assessment: 79 yo female here with HD on coumadin for afib. INR today is 1.17 and noted per MD to lower target INR due to fistula bleeding recently at dialysis. Noted with Hg= 8.3, pltc=421.  Home coumadin dose:  2.5 mg alternating with 1.25 mg   Goal of Therapy:  INR 2.0   Monitor platelets by anticoagulation protocol: Yes   Plan:  -Will give coumadin 2.5mg  today -Daily PT/INR  Harland German, Pharm D 07/21/2012 3:45 PM

## 2012-07-21 NOTE — Progress Notes (Signed)
Pt. Seen and examined. Agree with the NP/PA-C note as written. Feels better with volume removal yesterday, but cut short due to cramping. I agree, her dry weight may be higher than actual.  Plan repeat CXR today to evaluate effusions. Hold on heparin - will ask pharmacy to manage warfarin for goal INR 2.0. She needs a lower target due to fistula bleeding recently at dialysis.   Chrystie Nose, MD, [facility] Attending Cardiologist [facility]

## 2012-07-21 NOTE — Progress Notes (Signed)
Subjective: Admitted from office yesterday in decompensated heart failure, she has persistent non-productive or mildly productive cough with whitish sputum, bilateral pleural effusions on x-ray and elevated BNP (however, on dialysis).  Had dialysis last pm but goal not met secondary to cramping.  Mild SOB this AM.   Objective: Vital signs in last 24 hours: Temp:  [98.2 F (36.8 C)-98.8 F (37.1 C)] 98.2 F (36.8 C) (05/06 0436) Pulse Rate:  [72-81] 72 (05/06 0436) Resp:  [16-29] 18 (05/06 0436) BP: (147-228)/(55-108) 148/85 mmHg (05/06 0436) SpO2:  [94 %-100 %] 97 % (05/06 0436) Weight:  [141 lb 8.6 oz (64.2 kg)-146 lb 13.2 oz (66.6 kg)] 141 lb 15.6 oz (64.4 kg) (05/06 0436) Weight change:  Last BM Date: 07/20/12 Intake/Output from previous day: -1953  Wt 141.15 lb was 146 on admit. 05/05 0701 - 05/06 0700 In: -  Out: 1953  Intake/Output this shift:    PE: General:alert and oriented,no complaints, feels better Neck:no JVD Heart:S1S2 RRR + systolic murmur  Lungs:few crackles in the bases Abd:+ BS, soft non tender Ext:tr edema rt ankle only     TELE:  SR with brief episode of SVT rate at 150 for 3 sec.  Lab Results:  Recent Labs  07/20/12 1701  WBC 10.2  HGB 8.3*  HCT 25.3*  PLT 421*   BMET  Recent Labs  07/20/12 1701  NA 142  K 3.7  CL 98  CO2 28  GLUCOSE 116*  BUN 35*  CREATININE 9.22*  CALCIUM 9.2   No results found for this basename: TROPONINI, CK, MB,  in the last 72 hours  No results found for this basename: CHOL, HDL, LDLCALC, LDLDIRECT, TRIG, CHOLHDL   Lab Results  Component Value Date   HGBA1C 6.1* 06/17/2012     Lab Results  Component Value Date   TSH 5.463* 07/10/2011    Studies/Results: Echo 06/25/12  Left ventricle: The cavity size was normal. There was mild concentric hypertrophy. Systolic function was mildly reduced. The estimated ejection fraction was in the range of 45% to 50%. Mild global hypokinesis. LV diastolic function  cannot be assessed due to a-fib. The E/e' ratio is >10, suggesting elevated LV filling pressure. Ejection fraction: 45.7% (MOD, 1-plane). - Aortic valve: Mildly calcified leaflets. There was no stenosis. Mild regurgitation. - Mitral valve: Heavily calcified annulus with calclification of the subvalvular apparatus. Mild regurgitation. - Left atrium: The atrium was severely dilated (>40 ml/m2). - Atrial septum: No defect or patent foramen ovale was identified. - Tricuspid valve: Moderate regurgitation. - Pulmonary arteries: PA peak pressure: 38mm Hg (S). - Systemic veins: The IVC measures <2.1 cm but does not collapse >50%, suggesting elevated RA pressure of 10-15 mmHg. - Pericardium, extracardiac: A trivial pericardial effusion was identified  Medications: I have reviewed the patient's current medications. Scheduled Meds: . calcium acetate  667 mg Oral TID WC  . [START ON 07/22/2012] darbepoetin (ARANESP) injection - DIALYSIS  100 mcg Intravenous Q Wed-HD  . [START ON 07/22/2012] doxercalciferol  3 mcg Intravenous Q M,W,F-HD  . [START ON 07/22/2012] ferric gluconate (FERRLECIT/NULECIT) IV  62.5 mg Intravenous Q Wed-HD  . isosorbide mononitrate  30 mg Oral Daily  . levothyroxine  75 mcg Oral QAC breakfast  . multivitamin  1 tablet Oral QHS  . nebivolol  2.5 mg Oral Daily  . simvastatin  10 mg Oral q1800  . sodium chloride  3 mL Intravenous Q12H  . [START ON 07/24/2012] Vitamin D (Ergocalciferol)  50,000 Units Oral Q Fri  .  vitamin E  400 Units Oral Daily  . warfarin  1.25 mg Oral Custom  . warfarin  2.5 mg Oral Custom  . Warfarin - Physician Dosing Inpatient   Does not apply q1800   Continuous Infusions:  PRN Meds:.sodium chloride, acetaminophen, ondansetron (ZOFRAN) IV, sodium chloride  Assessment/Plan: Principal Problem:   Acute on chronic systolic and diastolic heart failure, NYHA class 4 Active Problems:   ESRD (end stage renal disease)   HTN (hypertension)   DM2 (diabetes  mellitus, type 2)   Hyperlipidemia   Atrial fibrillation, spont convers NSR 06/26/12   Diastolic CHF, chronic   Pleural effusion due to CHF (congestive heart failure)  PLAN: Pro BNP yest 66,717, INR yesterday 1.23  ? Add IV Heparin  Brief episode SVT rate 150, along with HTN, increase bystolic. Glucose stable,  Anemia down to 8.3 was 9.4 at previous discharge.  Aranesp and Iron added when receives dialysis.  Recheck labs.   LOS: 1 day   Time spent with pt. :20 minutes. [facility] R  Nurse Practitioner Certified Pager [phone] 07/21/2012, 8:40 AM

## 2012-07-22 DIAGNOSIS — C50919 Malignant neoplasm of unspecified site of unspecified female breast: Secondary | ICD-10-CM | POA: Diagnosis present

## 2012-07-22 DIAGNOSIS — Z7901 Long term (current) use of anticoagulants: Secondary | ICD-10-CM

## 2012-07-22 LAB — GLUCOSE, CAPILLARY
Glucose-Capillary: 213 mg/dL — ABNORMAL HIGH (ref 70–99)
Glucose-Capillary: 264 mg/dL — ABNORMAL HIGH (ref 70–99)

## 2012-07-22 LAB — PROTIME-INR
INR: 1.2 (ref 0.00–1.49)
Prothrombin Time: 15 s (ref 11.6–15.2)

## 2012-07-22 LAB — BASIC METABOLIC PANEL
BUN: 30 mg/dL — ABNORMAL HIGH (ref 6–23)
CO2: 27 mEq/L (ref 19–32)
Chloride: 94 mEq/L — ABNORMAL LOW (ref 96–112)
GFR calc non Af Amer: 4 mL/min — ABNORMAL LOW (ref >90)
Glucose, Bld: 217 mg/dL — ABNORMAL HIGH (ref 70–99)
Potassium: 3.9 mEq/L (ref 3.5–5.1)
Sodium: 134 mEq/L — ABNORMAL LOW (ref 135–145)

## 2012-07-22 LAB — CBC
HCT: 25.2 % — ABNORMAL LOW (ref 36.0–46.0)
Hemoglobin: 8.1 g/dL — ABNORMAL LOW (ref 12.0–15.0)
MCH: 29.6 pg (ref 26.0–34.0)
MCHC: 32.1 g/dL (ref 30.0–36.0)
MCV: 92 fL (ref 78.0–100.0)
Platelets: 374 K/uL (ref 150–400)
RBC: 2.74 MIL/uL — ABNORMAL LOW (ref 3.87–5.11)
RDW: 17.3 % — ABNORMAL HIGH (ref 11.5–15.5)
WBC: 7.2 K/uL (ref 4.0–10.5)

## 2012-07-22 LAB — PRO B NATRIURETIC PEPTIDE: Pro B Natriuretic peptide (BNP): 60795 pg/mL — ABNORMAL HIGH (ref 0–450)

## 2012-07-22 MED ORDER — LIDOCAINE HCL (PF) 1 % IJ SOLN: 5.0000 mL | INTRAMUSCULAR | Status: DC | PRN

## 2012-07-22 MED ORDER — DOXERCALCIFEROL 4 MCG/2ML IV SOLN
INTRAVENOUS | Status: AC
  Filled 2012-07-22: qty 2

## 2012-07-22 MED ORDER — NEPRO/CARBSTEADY PO LIQD: 237.0000 mL | ORAL | Status: DC | PRN

## 2012-07-22 MED ORDER — SODIUM CHLORIDE 0.9 % IV SOLN: 100.0000 mL | INTRAVENOUS | Status: DC | PRN

## 2012-07-22 MED ORDER — ALTEPLASE 2 MG IJ SOLR: 2.0000 mg | Freq: Once | INTRAMUSCULAR | Status: DC | PRN

## 2012-07-22 MED ORDER — NEBIVOLOL HCL 10 MG PO TABS
10.0000 mg | ORAL_TABLET | Freq: Every day | ORAL | Status: DC
  Administered 2012-07-23 – 2012-07-25 (×2): 10 mg via ORAL
  Filled 2012-07-22 (×3): qty 1

## 2012-07-22 MED ORDER — HEPARIN SODIUM (PORCINE) 1000 UNIT/ML DIALYSIS: 1000.0000 [IU] | INTRAMUSCULAR | Status: DC | PRN

## 2012-07-22 MED ORDER — LIDOCAINE-PRILOCAINE 2.5-2.5 % EX CREA: 1.0000 "application " | TOPICAL_CREAM | CUTANEOUS | Status: DC | PRN

## 2012-07-22 MED ORDER — DARBEPOETIN ALFA-POLYSORBATE 100 MCG/0.5ML IJ SOLN
INTRAMUSCULAR | Status: AC
  Filled 2012-07-22: qty 0.5

## 2012-07-22 MED ORDER — HEPARIN SODIUM (PORCINE) 1000 UNIT/ML DIALYSIS: 2000.0000 [IU] | Freq: Once | INTRAMUSCULAR | Status: DC

## 2012-07-22 MED ORDER — WARFARIN SODIUM 2.5 MG PO TABS
2.5000 mg | ORAL_TABLET | Freq: Once | ORAL | Status: DC
  Filled 2012-07-22: qty 1

## 2012-07-22 MED ORDER — PENTAFLUOROPROP-TETRAFLUOROETH EX AERO: 1.0000 "application " | INHALATION_SPRAY | CUTANEOUS | Status: DC | PRN

## 2012-07-22 NOTE — Progress Notes (Signed)
Subjective:  SOB improved  Objective:  Vital Signs in the last 24 hours: Temp:  [97.9 F (36.6 C)-98.6 F (37 C)] 97.9 F (36.6 C) (05/07 0533) Pulse Rate:  [65-76] 65 (05/07 0533) Resp:  [18] 18 (05/07 0533) BP: (128-160)/(58-79) 128/58 mmHg (05/07 0533) SpO2:  [97 %-100 %] 97 % (05/07 0533) Weight:  [63.7 kg (140 lb 6.9 oz)] 63.7 kg (140 lb 6.9 oz) (05/07 0533)  Intake/Output from previous day:  Intake/Output Summary (Last 24 hours) at 07/22/12 1102 Last data filed at 07/22/12 0730  Gross per 24 hour  Intake    360 ml  Output      0 ml  Net    360 ml    Physical Exam: General appearance: alert, cooperative and no distress Lungs: clear to auscultation bilaterally Heart: regular rate and rhythm   Rate: 66  Rhythm: normal sinus rhythm  Lab Results:  Recent Labs  07/20/12 1701 07/21/12 1351  WBC 10.2  --   HGB 8.3* 8.3*  PLT 421*  --     Recent Labs  07/20/12 1701 07/21/12 1351  NA 142 136  K 3.7 4.3  CL 98 95*  CO2 28 27  GLUCOSE 116* 204*  BUN 35* 17  CREATININE 9.22* 5.32*   No results found for this basename: TROPONINI, CK, MB,  in the last 72 hours Hepatic Function Panel No results found for this basename: PROT, ALBUMIN, AST, ALT, ALKPHOS, BILITOT, BILIDIR, IBILI,  in the last 72 hours No results found for this basename: CHOL,  in the last 72 hours  Recent Labs  07/21/12 1351  INR 1.17    Imaging: Imaging results have been reviewed  Cardiac Studies:  Assessment/Plan:   Principal Problem:   Acute on chronic systolic and diastolic heart failure, NYHA class 4 Active Problems:   ESRD (end stage renal disease)   HTN (hypertension)   Anemia   Atrial fibrillation, spont convers NSR 06/26/12   Chronic anticoagulation   DM2 (diabetes mellitus, type 2)   Hyperlipidemia   Diastolic CHF, chronic   Breast cancer, s/p Lt mastectomy    PLAN: For HD today. Coumadin adjusted without Heparin secondary to high bleeding risk from AVF. Myoview low  risk 06/26/12. Echo 06/25/12- EF 45-50%. Home soon?  [employer] PA-C 07/22/2012, 11:02 AM

## 2012-07-22 NOTE — Progress Notes (Signed)
Physical Therapy Evaluation Cancellation Note  Physical therapy orders received; Patient currently off the floor for HD. Will perform evaluation in the am if pt available.  Charlotte Crumb, PT DPT  [phone]

## 2012-07-22 NOTE — Progress Notes (Signed)
I have seen and evaluated the patient this PM along with Corine Shelter, PA.  I agree with his findings, examination as well as impression recommendations.  Significantly improved after HD.  Seen this PM in HD -- feels well, but BP is quite elevated --> have increased dose of Bystolic to 10mg  & discussed with Nephrologist re: need to continue to treat BP as OP. CXR notably improved.  If stable tomorrow & BP improved, anticipate d/c in AM.     Marykay Lex, M.D., M.S. [facility] [address] [place], [place]  [address]  [phone] Pager # [phone] 07/22/2012 9:52 PM

## 2012-07-22 NOTE — Progress Notes (Signed)
Subjective: On HD , no SOB  Objective Vital signs in last 24 hours: Filed Vitals:   07/22/12 1430 07/22/12 1500 07/22/12 1530 07/22/12 1600  BP: 206/78 186/79 214/103 211/77  Pulse: 70 70 70 69  Temp:      TempSrc:      Resp: 24 16 16 16   Height:      Weight:      SpO2: 100% 100% 100%    Weight change: -2 kg (-4 lb 6.6 oz)  Intake/Output Summary (Last 24 hours) at 07/22/12 1619 Last data filed at 07/22/12 0730  Gross per 24 hour  Intake    360 ml  Output      0 ml  Net    360 ml   Labs: Basic Metabolic Panel:  Recent Labs Lab 07/20/12 1701 07/21/12 1351 07/22/12 1200  NA 142 136 134*  K 3.7 4.3 3.9  CL 98 95* 94*  CO2 28 27 27   GLUCOSE 116* 204* 217*  BUN 35* 17 30*  CREATININE 9.22* 5.32* 7.59*  CALCIUM 9.2 9.1 9.4   Liver Function Tests: No results found for this basename: AST, ALT, ALKPHOS, BILITOT, PROT, ALBUMIN,  in the last 168 hours No results found for this basename: LIPASE, AMYLASE,  in the last 168 hours No results found for this basename: AMMONIA,  in the last 168 hours CBC:  Recent Labs Lab 07/20/12 1701 07/21/12 1351 07/22/12 1200  WBC 10.2  --  7.2  NEUTROABS 7.3  --   --   HGB 8.3* 8.3* 8.1*  HCT 25.3* 26.3* 25.2*  MCV 92.3  --  92.0  PLT 421*  --  374   PT/INR: @LABRCNTIP (inr:5)   Scheduled Meds ) . calcium acetate  667 mg Oral TID WC  . darbepoetin (ARANESP) injection - DIALYSIS  100 mcg Intravenous Q Wed-HD  . doxercalciferol  3 mcg Intravenous Q M,W,F-HD  . ferric gluconate (FERRLECIT/NULECIT) IV  62.5 mg Intravenous Q Wed-HD  . [START ON 07/23/2012] heparin  2,000 Units Dialysis Once in dialysis  . isosorbide mononitrate  30 mg Oral Daily  . levothyroxine  75 mcg Oral QAC breakfast  . multivitamin  1 tablet Oral QHS  . nebivolol  5 mg Oral Daily  . simvastatin  10 mg Oral q1800  . sodium chloride  3 mL Intravenous Q12H  . [START ON 07/24/2012] Vitamin D (Ergocalciferol)  50,000 Units Oral Q Fri  . vitamin E  400 Units Oral  Daily  . warfarin  2.5 mg Oral ONCE-1800  . Warfarin - Physician Dosing Inpatient   Does not apply q1800    Physical Exam:  Blood pressure 211/77, pulse 69, temperature 98 F (36.7 C), temperature source Oral, resp. rate 16, height 5\' 2"  (1.575 m), weight 64.5 kg (142 lb 3.2 oz), SpO2 100.00%.  General: Alert pleasant elderly BF NAD but tells me she can not lay down flat because of SOB  HEENT: [place], MMM,  Eyes: eomi  Neck: Supple , pos. jvd  Heart: RRR, 2/6 sem apex, no rub  Lungs: faint bilat rales  Abdomen: bs pos. Soft, nontender  Extremities: 2+ right pedal edema, 1+ left pedal edema  Skin: Healed Right anle surgical sight. No overt rash or ulcer  Neuro: alert. Ox3, no acute focal deficits noted  Dialysis Access: Pos. Bruit Right U A AVF with mld swelling mid arm   Dialysis Orders: [facility] MWF 66kg  2K, 2.25Ca bath 3hrs Heparin 2000 RUA AVF BFR 325 Hectorol Epogen 4400  Venofer 50mg  weekly  Assessment/Plan  1. Pulm edema- resolved by f/u cxr 5/6. HD today, decrease dry wt as tolerated 2. ESRD, cont HD MWF. May need increased time to 4hr with HD 3. Hypertension/volume - Below dry weight, will need new dry weight upon discharge. On bystolic only at home, ^'d to 5 mg/ day here 4. Atrial Fib with Rapid Vent Rate on admit = per Card. On Coumadin and now in Sinus 5. Anemia - hgb =9.4 aranesp q wed hd and weekly hd venofer 6. Metabolic bone disease - Binders / hectoral on hd 3.3 phos 7. Nutrition - renal diet and renavite  8. DM type 2 = Per admit/ on diet And Humalog As op 9. Dispo- ok for d/c from renal standpoint   Vinson Moselle  MD [phone] pgr    [phone] cell 07/22/2012, 4:19 PM

## 2012-07-22 NOTE — Progress Notes (Signed)
Inpatient Diabetes Program Recommendations  AACE/ADA: New Consensus Statement on Inpatient Glycemic Control (2013)  Target Ranges:  Prepandial:   less than 140 mg/dL      Peak postprandial:   less than 180 mg/dL (1-2 hours)      Critically ill patients:  140 - 180 mg/dL   Reason for Visit: Hyperglycemia Results for CORRINNA, KARAPETYAN (MRN [id number]) as of 07/22/2012 13:46  Ref. Range 07/21/2012 10:59 07/21/2012 16:31 07/21/2012 20:39 07/22/2012 05:58 07/22/2012 11:40  Glucose-Capillary Latest Range: 70-99 mg/dL 409 (H) 811 (H) 914 (H) 119 (H) 213 (H)  Results for CERI, MAYER (MRN [id number]) as of 07/22/2012 13:46  Ref. Range 06/17/2012 13:57  Hemoglobin A1C Latest Range: <5.7 % 6.1 (H)    Inpatient Diabetes Program Recommendations Insulin - Basal: May need small amount of Lantus - 5 units QHS Correction (SSI): Add Novolog sensitive tidwc  Note: Will follow.  Thank you. Ailene Ards, RD, LDN, CDE Inpatient Diabetes Coordinator [phone]

## 2012-07-22 NOTE — Care Management Note (Unsigned)
    Page 1 of 1   07/22/2012     4:11:58 PM   CARE MANAGEMENT NOTE 07/22/2012  Patient:  [name], [name]   Account Number:  [id number]  Date Initiated:  07/22/2012  Documentation initiated by:  Tomeca Helm  Subjective/Objective Assessment:   PT ADM ON 07/20/12 WITH CHF EXACERBATION.  PTA, PT RESIDES AND HAS 24HR SUPERVISION PROVIDED BY DAUGHTER AND OTHER FAMILY.     Action/Plan:   MET WITH DTR TO DISCUSS DC PLANS.  DTR STATES PT DOES NOT NEED HH CARE, AS SHE HAS 24HR SUPERVISION AT HOME, AND IS CURRENTLY NWB ON RT FOOT S/P SURGERY ON 06/16/12.   Anticipated DC Date:  07/23/2012   Anticipated DC Plan:  HOME/SELF CARE      DC Planning Services  CM consult      Choice offered to / List presented to:             Status of service:  In process, will continue to follow Medicare Important Message given?   (If response is "NO", the following Medicare IM given date fields will be blank) Date Medicare IM given:   Date Additional Medicare IM given:    Discharge Disposition:    Per UR Regulation:  Reviewed for med. necessity/level of care/duration of stay  If discussed at Long Length of Stay Meetings, dates discussed:    Comments:  Nickie Retort, BSN [phone] DTR STATES PT MAY NEED HOME O2 AT DC.  WILL NEED TO CHECK RA SATS TO DETERMINE THIS.  WILL FOLLOW.

## 2012-07-22 NOTE — Progress Notes (Signed)
ANTICOAGULATION CONSULT NOTE - Initial Consult  Pharmacy Consult for Coumadin Indication: atrial fibrillation  Allergies  Allergen Reactions  . Codeine Swelling    Patient Measurements: Height: 5\' 2"  (157.5 cm) Weight: 142 lb 3.2 oz (64.5 kg) IBW/kg (Calculated) : 50.1   Vital Signs: Temp: 98 F (36.7 C) (05/07 1345) Temp src: Oral (05/07 1345) BP: 186/79 mmHg (05/07 1500) Pulse Rate: 70 (05/07 1500)  Labs:  Recent Labs  07/20/12 1701 07/21/12 1351 07/22/12 1200  HGB 8.3* 8.3* 8.1*  HCT 25.3* 26.3* 25.2*  PLT 421*  --  374  LABPROT 15.3* 14.7 15.0  INR 1.23 1.17 1.20  CREATININE 9.22* 5.32* 7.59*    Estimated Creatinine Clearance: 5.1 ml/min (by C-G formula based on Cr of 7.59).   Assessment: On Coumadin PTA for Afib.  Patient reports that her dose was recently changed due to bleeding at HD.  Her latest PTA dose was 2.5 mg alternating with 1.25 mg per her report.  She reports that she has not had any Coumadin since Saturday.  INR now 1.2, no heparin due to high bleeding risk from AVF.  Goal of Therapy:  INR = 2 Monitor platelets by anticoagulation protocol: Yes   Plan:  -Coumadn 2.5mg  -Daily PT/INR  Harland German, Pharm D 07/22/2012 3:42 PM

## 2012-07-22 NOTE — Procedures (Signed)
I was present at this dialysis session. I have reviewed the session itself and made appropriate changes.   Vinson Moselle, MD [employer] 07/22/2012, 5:36 PM

## 2012-07-23 ENCOUNTER — Encounter (HOSPITAL_COMMUNITY): Payer: Self-pay | Admitting: General Practice

## 2012-07-23 LAB — GLUCOSE, CAPILLARY: Glucose-Capillary: 156 mg/dL — ABNORMAL HIGH (ref 70–99)

## 2012-07-23 MED ORDER — ISOSORBIDE MONONITRATE ER 30 MG PO TB24
30.0000 mg | ORAL_TABLET | Freq: Once | ORAL | Status: DC
  Filled 2012-07-23: qty 1

## 2012-07-23 MED ORDER — HEPARIN SODIUM (PORCINE) 1000 UNIT/ML DIALYSIS
1000.0000 [IU] | INTRAMUSCULAR | Status: DC | PRN
  Filled 2012-07-23: qty 1

## 2012-07-23 MED ORDER — ISOSORBIDE MONONITRATE ER 60 MG PO TB24
60.0000 mg | ORAL_TABLET | Freq: Every day | ORAL | Status: DC
  Administered 2012-07-25: 60 mg via ORAL
  Filled 2012-07-23 (×2): qty 1

## 2012-07-23 MED ORDER — LIDOCAINE-PRILOCAINE 2.5-2.5 % EX CREA
1.0000 "application " | TOPICAL_CREAM | CUTANEOUS | Status: DC | PRN
  Filled 2012-07-23: qty 5

## 2012-07-23 MED ORDER — SODIUM CHLORIDE 0.9 % IV SOLN: 100.0000 mL | INTRAVENOUS | Status: DC | PRN

## 2012-07-23 MED ORDER — LABETALOL HCL 5 MG/ML IV SOLN
INTRAVENOUS | Status: AC
  Filled 2012-07-23: qty 4

## 2012-07-23 MED ORDER — ALTEPLASE 2 MG IJ SOLR
2.0000 mg | Freq: Once | INTRAMUSCULAR | Status: AC | PRN
  Filled 2012-07-23: qty 2

## 2012-07-23 MED ORDER — LIDOCAINE HCL (PF) 1 % IJ SOLN: 5.0000 mL | INTRAMUSCULAR | Status: DC | PRN

## 2012-07-23 MED ORDER — HYDRALAZINE HCL 25 MG PO TABS
25.0000 mg | ORAL_TABLET | Freq: Three times a day (TID) | ORAL | Status: DC
  Administered 2012-07-24 – 2012-07-25 (×5): 25 mg via ORAL
  Filled 2012-07-23 (×9): qty 1

## 2012-07-23 MED ORDER — PENTAFLUOROPROP-TETRAFLUOROETH EX AERO: 1.0000 "application " | INHALATION_SPRAY | CUTANEOUS | Status: DC | PRN

## 2012-07-23 MED ORDER — GUAIFENESIN-DM 100-10 MG/5ML PO SYRP
5.0000 mL | ORAL_SOLUTION | ORAL | Status: DC | PRN
  Administered 2012-07-24 – 2012-07-25 (×3): 5 mL via ORAL
  Filled 2012-07-23 (×4): qty 5

## 2012-07-23 MED ORDER — HEPARIN SODIUM (PORCINE) 1000 UNIT/ML DIALYSIS
2000.0000 [IU] | Freq: Once | INTRAMUSCULAR | Status: AC
  Administered 2012-07-25: 2000 [IU] via INTRAVENOUS_CENTRAL
  Filled 2012-07-23: qty 2

## 2012-07-23 MED ORDER — WARFARIN SODIUM 2.5 MG PO TABS
2.5000 mg | ORAL_TABLET | Freq: Once | ORAL | Status: AC
  Administered 2012-07-23: 2.5 mg via ORAL
  Filled 2012-07-23: qty 1

## 2012-07-23 MED ORDER — NEPRO/CARBSTEADY PO LIQD
237.0000 mL | ORAL | Status: DC | PRN
  Filled 2012-07-23: qty 237

## 2012-07-23 NOTE — Progress Notes (Signed)
Subjective: SOB overnight, coughing, desat's with ambulation on RA  Objective Vital signs in last 24 hours: Filed Vitals:   07/22/12 1818 07/22/12 2155 07/23/12 0426 07/23/12 0900  BP: 156/111 128/108 156/67   Pulse: 68 71 63   Temp: 97 F (36.1 C)  98.7 F (37.1 C)   TempSrc: Oral  Oral   Resp: 21 20 21    Height:      Weight: 63 kg (138 lb 14.2 oz)  62.4 kg (137 lb 9.1 oz)   SpO2: 95% 98% 100% 94%   Weight change: 0.8 kg (1 lb 12.2 oz)  Intake/Output Summary (Last 24 hours) at 07/23/12 1209 Last data filed at 07/23/12 0700  Gross per 24 hour  Intake    240 ml  Output   1403 ml  Net  -1163 ml   Labs: Basic Metabolic Panel:  Recent Labs Lab 07/20/12 1701 07/21/12 1351 07/22/12 1200  NA 142 136 134*  K 3.7 4.3 3.9  CL 98 95* 94*  CO2 28 27 27   GLUCOSE 116* 204* 217*  BUN 35* 17 30*  CREATININE 9.22* 5.32* 7.59*  CALCIUM 9.2 9.1 9.4   Liver Function Tests: No results found for this basename: AST, ALT, ALKPHOS, BILITOT, PROT, ALBUMIN,  in the last 168 hours No results found for this basename: LIPASE, AMYLASE,  in the last 168 hours No results found for this basename: AMMONIA,  in the last 168 hours CBC:  Recent Labs Lab 07/20/12 1701 07/21/12 1351 07/22/12 1200  WBC 10.2  --  7.2  NEUTROABS 7.3  --   --   HGB 8.3* 8.3* 8.1*  HCT 25.3* 26.3* 25.2*  MCV 92.3  --  92.0  PLT 421*  --  374   PT/INR: @LABRCNTIP (inr:5)   Scheduled Meds ) . calcium acetate  667 mg Oral TID WC  . darbepoetin (ARANESP) injection - DIALYSIS  100 mcg Intravenous Q Wed-HD  . doxercalciferol  3 mcg Intravenous Q M,W,F-HD  . ferric gluconate (FERRLECIT/NULECIT) IV  62.5 mg Intravenous Q Wed-HD  . isosorbide mononitrate  30 mg Oral Daily  . levothyroxine  75 mcg Oral QAC breakfast  . multivitamin  1 tablet Oral QHS  . nebivolol  10 mg Oral Daily  . simvastatin  10 mg Oral q1800  . sodium chloride  3 mL Intravenous Q12H  . [START ON 07/24/2012] Vitamin D (Ergocalciferol)  50,000  Units Oral Q Fri  . vitamin E  400 Units Oral Daily  . warfarin  2.5 mg Oral ONCE-1800  . Warfarin - Physician Dosing Inpatient   Does not apply q1800    Physical Exam:  Blood pressure 156/67, pulse 63, temperature 98.7 F (37.1 C), temperature source Oral, resp. rate 21, height 5\' 2"  (1.575 m), weight 62.4 kg (137 lb 9.1 oz), SpO2 94.00%.  Gen: Alert, coughing off and on, not in distress Neck: Supple, +JVD Heart: RRR, 2/6 sem apex, no rub  Lungs: clear to bases bilat Abdomen: bs pos. Soft, nontender  Extremities: trace edema of ankles Skin: Healed Right anle surgical sight. No overt rash or ulcer  Neuro: alert. Ox3, no acute focal deficits noted  Dialysis Access: Pos. Bruit Right U A AVF with mld swelling mid arm   Dialysis Orders: [facility] MWF 66kg  2K, 2.25Ca bath 3hrs Heparin 2000 RUA AVF BFR 325 Hectorol Epogen 4400 Venofer 50mg  weekly  Assessment/Plan  1. Pulm edema- still symptomatic and hypoxemic. Will plan extra HD today and usual HD tomorrow to try  and lower dry weight to around 59-60kg. If she doesn't tolerate this and remains hypoxic may need to look for other causes, though i still think this could be simple vol overload from lean body wt loss 2. ESRD, usual HD MWF. May need increased time to 4hr with HD 3. Access stenosis- had prolonged bleeding (6 hrs) post HD at outpt center recently acc to patient, pt on coumadin but says bleeding probs recently have been out of proportion. Will ask IR to do fistulogram to eval for stenosis 4. Hypertension/volume - BP's high, Bystolic increased, reduce vol with HD as tolerated 5. Atrial Fib with Rapid Vent Rate on admit = per Card. On Coumadin and now in Sinus 6. Anemia - hgb =9.4 aranesp q wed hd and weekly hd venofer 7. Metabolic bone disease - Binders / hectoral on hd 3.3 phos 8. Nutrition - renal diet and renavite  9. DM type 2 = Per admit/ on diet And Humalog As Chipper Herb  MD [phone] pgr    [phone]  cell 07/23/2012, 12:09 PM

## 2012-07-23 NOTE — Progress Notes (Signed)
[facility]  Subjective: Her breathing is much improved, although she has mild SOB when ambulating. She continues to endorse a productive cough that is worsening with white colored sputum. She is requesting something for her cough.   Objective: Vital signs in last 24 hours: Temp:  [97 F (36.1 C)-98.7 F (37.1 C)] 98.7 F (37.1 C) (05/08 0426) Pulse Rate:  [63-71] 63 (05/08 0426) Resp:  [16-24] 21 (05/08 0426) BP: (128-214)/(59-128) 156/67 mmHg (05/08 0426) SpO2:  [94 %-100 %] 94 % (05/08 0900) Weight:  [137 lb 9.1 oz (62.4 kg)-142 lb 3.2 oz (64.5 kg)] 137 lb 9.1 oz (62.4 kg) (05/08 0426) Last BM Date: 07/22/12  Intake/Output from previous day: 05/07 0701 - 05/08 0700 In: 600 [P.O.:600] Out: 1403  Intake/Output this shift:    Medications Current Facility-Administered Medications  Medication Dose Route Frequency Provider Last Rate Last Dose  . 0.9 %  sodium chloride infusion  250 mL Intravenous PRN Wilburt Finlay, PA-C      . acetaminophen (TYLENOL) tablet 650 mg  650 mg Oral Q4H PRN Wilburt Finlay, PA-C      . calcium acetate (PHOSLO) capsule 667 mg  667 mg Oral TID WC Wilburt Finlay, PA-C   667 mg at 07/23/12 0608  . darbepoetin (ARANESP) injection 100 mcg  100 mcg Intravenous Q Wed-HD Donald Pore, PA-C   100 mcg at 07/22/12 1522  . doxercalciferol (HECTOROL) injection 3 mcg  3 mcg Intravenous Q M,W,F-HD Donald Pore, PA-C   3 mcg at 07/22/12 1524  . ferric gluconate (NULECIT) 62.5 mg in sodium chloride 0.9 % 100 mL IVPB  62.5 mg Intravenous Q Wed-HD Donald Pore, PA-C   62.5 mg at 07/22/12 1612  . isosorbide mononitrate (IMDUR) 24 hr tablet 30 mg  30 mg Oral Daily Wilburt Finlay, PA-C   30 mg at 07/22/12 1052  . levothyroxine (SYNTHROID, LEVOTHROID) tablet 75 mcg  75 mcg Oral QAC breakfast Wilburt Finlay, PA-C   75 mcg at 07/23/12 0608  . multivitamin (RENA-VIT) tablet 1 tablet  1 tablet Oral QHS Wilburt Finlay, PA-C   1 tablet at 07/22/12 2157  .  nebivolol (BYSTOLIC) tablet 10 mg  10 mg Oral Daily Marykay Lex, MD      . ondansetron [facility]) injection 4 mg  4 mg Intravenous Q6H PRN Wilburt Finlay, PA-C      . simvastatin (ZOCOR) tablet 10 mg  10 mg Oral q1800 Wilburt Finlay, PA-C   10 mg at 07/22/12 2157  . sodium chloride 0.9 % injection 3 mL  3 mL Intravenous Q12H Wilburt Finlay, PA-C      . sodium chloride 0.9 % injection 3 mL  3 mL Intravenous PRN Wilburt Finlay, PA-C      . [START ON 07/24/2012] Vitamin D (Ergocalciferol) (DRISDOL) capsule 50,000 Units  50,000 Units Oral Q Fri Wilburt Finlay, PA-C      . vitamin E capsule 400 Units  400 Units Oral Daily Wilburt Finlay, PA-C   400 Units at 07/22/12 1052  . warfarin (COUMADIN) tablet 2.5 mg  2.5 mg Oral ONCE-1800 Benny Lennert, [facility]      . Warfarin - Physician Dosing Inpatient   Does not apply [id number] Chrystie Nose, MD        PE: General appearance: alert, cooperative and no distress Lungs: clear to auscultation bilaterally Heart: regular rate and rhythm Extremities: no LEE Pulses: 2+ and symmetric Skin: warm and dry Neurologic: Grossly normal  Lab Results:  Recent Labs  07/20/12 1701 07/21/12 1351 07/22/12 1200  WBC 10.2  --  7.2  HGB 8.3* 8.3* 8.1*  HCT 25.3* 26.3* 25.2*  PLT 421*  --  374   BMET  Recent Labs  07/20/12 1701 07/21/12 1351 07/22/12 1200  NA 142 136 134*  K 3.7 4.3 3.9  CL 98 95* 94*  CO2 28 27 27   GLUCOSE 116* 204* 217*  BUN 35* 17 30*  CREATININE 9.22* 5.32* 7.59*  CALCIUM 9.2 9.1 9.4   PT/INR  Recent Labs  07/20/12 1701 07/21/12 1351 07/22/12 1200  LABPROT 15.3* 14.7 15.0  INR 1.23 1.17 1.20   BNP (last 3 results)  Recent Labs  07/20/12 1702 07/22/12 1200  PROBNP 66717.0* 60795.0*   Assessment/Plan  Principal Problem:   Acute on chronic systolic and diastolic heart failure, NYHA class 4 Active Problems:   ESRD (end stage renal disease)   HTN (hypertension)   DM2 (diabetes mellitus, type 2)   Hyperlipidemia   Anemia    Atrial fibrillation, spont convers NSR 06/26/12   Diastolic CHF, chronic   Breast cancer, s/p Lt mastectomy   Chronic anticoagulation  Plan: BP is much improved. She had systolic pressures in the 200s yesterday.  Bystolic was increased to 10 mg. SBP is in the 150s today. Her breathing has improved. She has undergone 2 dialysis sessions since admission for extra fluid removal. CXR from 5/6 is notably improved from CXR on 5/2. Lungs are clear on exam. No LEE. BNP remains elevated, but has improved by 6K since admission. Plan for possible discharge home today. ?Robitussin for cough?? MD to follow.     LOS: 3 days    [name] 07/23/2012 9:04 AM  I have seen and evaluated the patient this PM along with Boyce Medici, PA. I agree with her findings, examination as well as impression recommendations.  She seems to be fine while in bed, but continues to be dyspneic & actually hypoxic on RA with activity, CXR has improved, but still with interstitial prominence.  BP also remains elevated.   After discussing with Dr. Arlean Hopping from Renal -- he is still concerned that she still needs additional volume removal with HD today & tomorrow.   Her persistent difficulties begs the ? Of possible coronary ischemia vs. Significant diastolic dysfunction with increased LV filling pressures.  Her EF by echo last month is a bit lower than 2010, but severe LA dilation would argue elevated LV filling pressures.  She did have a recent Non-invasive Nuclear ST with no evidence of ischemia -- however microvascular ischemia is still of concern.  Will increase Imdur dose & consider Hydralazine for additional afterload reduction (dependent upon BP response to increased volume removal).  Remains on warfarin with Afib -- no further occurences. Continue synthroid  Will humidify O2 and provide robitussin for cough -- although this may be related to pulmonary vascular congestion.  DISPO: May need home O2, but  should not need home PT per daughter as she has ~24 hr care in the home.   Marykay Lex, M.D., M.S. [facility] [address] [place], [place]  [address]  [phone] Pager # [phone] 07/23/2012 12:20 PM    '

## 2012-07-23 NOTE — Evaluation (Signed)
Physical Therapy Evaluation and D/C Patient Details Name: [name] MRN: [id number] DOB: 10-26-1928 Today's Date: 07/23/2012 Time: 1478-2956 PT Time Calculation (min): 14 min  PT Assessment / Plan / Recommendation Clinical Impression  Pt s/p CHF exacerbation with good mobility overall.  Does not have PT needs at this time.  Functions with Modif I with RW.  Pt did however desat with activity therefore may qualify for O2 at home.  Will leave a note for MD.  Do not feel HHPT is indicated and pt agrees stating that she finished her HHPT already.      PT Assessment  Patent does not need any further PT services    Follow Up Recommendations  No PT follow up                Equipment Recommendations  None recommended by PT               Precautions / Restrictions Precautions Precautions: None Restrictions Weight Bearing Restrictions: Yes RLE Weight Bearing: Non weight bearing   Pertinent Vitals/Pain Desat to 81% on RA, 96 % and > with O2.      Mobility  Bed Mobility Bed Mobility: Rolling Right;Right Sidelying to Sit Rolling Right: 7: Independent Right Sidelying to Sit: 7: Independent Transfers Transfers: Sit to Stand;Stand to Sit Sit to Stand: 7: Independent;With upper extremity assist;From bed Stand to Sit: 7: Independent;With upper extremity assist;To bed Ambulation/Gait Ambulation/Gait Assistance: 6: Modified independent (Device/Increase time) Ambulation Distance (Feet): 50 Feet Assistive device: Rolling walker Ambulation/Gait Assistance Details: Pt safe using RW.  No LOB with min challenges.  Desat to 81% on RA with ambulation.   Gait Pattern: Step-to pattern Stairs: No Wheelchair Mobility Wheelchair Mobility: No    PT Goals  N/A  Visit Information  Last PT Received On: 07/23/12 Assistance Needed: +1    Subjective Data  Subjective: "I feel good but I get winded." Patient Stated Goal: go home   Prior Functioning  Home Living Lives With:  Family Available Help at Discharge: Family;Available 24 hours/day Type of Home: House Home Access: Ramped entrance Home Layout: One level Bathroom Shower/Tub: Engineer, manufacturing systems: Standard Home Adaptive Equipment: Walker - rolling;Bedside commode/3-in-1;Shower chair with back;Hand-held shower hose Prior Function Level of Independence: Independent with assistive device(s) Able to Take Stairs?: No Driving: No Vocation: Retired Musician: No difficulties Dominant Hand: Right    Cognition  Cognition Arousal/Alertness: Awake/alert Behavior During Therapy: WFL for tasks assessed/performed Overall Cognitive Status: Within Functional Limits for tasks assessed    Extremity/Trunk Assessment Right Lower Extremity Assessment RLE ROM/Strength/Tone: Deficits;Unable to fully assess RLE ROM/Strength/Tone Deficits: Did not test right LE fully but appears WFL Left Lower Extremity Assessment LLE ROM/Strength/Tone: WFL for tasks assessed   Balance Static Standing Balance Static Standing - Balance Support: Bilateral upper extremity supported;During functional activity Static Standing - Level of Assistance: 6: Modified independent (Device/Increase time) Static Standing - Comment/# of Minutes: 2 minutes  Dynamic Standing Balance Dynamic Standing - Balance Support: Bilateral upper extremity supported;During functional activity Dynamic Standing - Level of Assistance: 6: Modified independent (Device/Increase time) Dynamic Standing - Balance Activities: Forward lean/weight shifting;Lateral lean/weight shifting;Reaching across midline  End of Session PT - End of Session Equipment Utilized During Treatment: Gait belt;Oxygen Activity Tolerance: Patient limited by fatigue Patient left: in bed;with call bell/phone within reach Nurse Communication: Mobility status       INGOLD,Anisten Tomassi 07/23/2012, 9:11 AM  [facility] [phone]  (pager)

## 2012-07-23 NOTE — Progress Notes (Signed)
SATURATION QUALIFICATIONS: (This note is used to comply with regulatory documentation for home oxygen)  Patient Saturations on Room Air at Rest = 94%  Patient Saturations on Room Air while Ambulating = 81%  Patient Saturations on 3 Liters of oxygen while Ambulating = 96%  Please briefly explain why patient needs home oxygen:  Pt desats to 81% on RA with DOE 2/4 with activity.  DOE improves with O2 with activity with sat >96%.   Thanks. [facility] [phone] (pager)

## 2012-07-23 NOTE — Progress Notes (Signed)
ANTICOAGULATION CONSULT NOTE - Initial Consult  Pharmacy Consult for Coumadin Indication: atrial fibrillation  Allergies  Allergen Reactions  . Codeine Swelling    Patient Measurements: Height: 5\' 2"  (157.5 cm) Weight: 137 lb 9.1 oz (62.4 kg) IBW/kg (Calculated) : 50.1   Vital Signs: Temp: 98.7 F (37.1 C) (05/08 0426) Temp src: Oral (05/08 0426) BP: 156/67 mmHg (05/08 0426) Pulse Rate: 63 (05/08 0426)  Labs:  Recent Labs  07/20/12 1701 07/21/12 1351 07/22/12 1200  HGB 8.3* 8.3* 8.1*  HCT 25.3* 26.3* 25.2*  PLT 421*  --  374  LABPROT 15.3* 14.7 15.0  INR 1.23 1.17 1.20  CREATININE 9.22* 5.32* 7.59*    Estimated Creatinine Clearance: 5 ml/min (by C-G formula based on Cr of 7.59).   Assessment: On Coumadin PTA for Afib.  Patient reports that her dose was recently changed due to bleeding at HD.  Her latest PTA dose was 2.5 mg alternating with 1.25 mg per her report.  INR 1.2 on 5/7 and patient refused lab draws this am. Per MD- no heparin due to high bleeding risk from AVF.  Goal of Therapy:  INR = 2 Monitor platelets by anticoagulation protocol: Yes   Plan:  -Coumadn 2.5mg  -Daily PT/INR, if refuses further draws today will be able to get with HD on 5/9.  Harland German, Pharm D 07/23/2012 10:18 AM

## 2012-07-24 ENCOUNTER — Inpatient Hospital Stay (HOSPITAL_COMMUNITY): Payer: Medicare Other

## 2012-07-24 ENCOUNTER — Encounter (HOSPITAL_COMMUNITY): Payer: Self-pay | Admitting: Radiology

## 2012-07-24 LAB — GLUCOSE, CAPILLARY
Glucose-Capillary: 211 mg/dL — ABNORMAL HIGH (ref 70–99)
Glucose-Capillary: 159 mg/dL — ABNORMAL HIGH (ref 70–99)
Glucose-Capillary: 181 mg/dL — ABNORMAL HIGH (ref 70–99)

## 2012-07-24 MED ORDER — NEPRO/CARBSTEADY PO LIQD: 237.0000 mL | Freq: Two times a day (BID) | ORAL | Status: DC

## 2012-07-24 MED ORDER — IOHEXOL 300 MG/ML  SOLN
100.0000 mL | Freq: Once | INTRAMUSCULAR | Status: AC | PRN
  Administered 2012-07-24: 50 mL via INTRAVENOUS

## 2012-07-24 MED ORDER — NEPRO/CARBSTEADY PO LIQD
237.0000 mL | Freq: Every day | ORAL | Status: DC
  Filled 2012-07-24 (×3): qty 237

## 2012-07-24 NOTE — Progress Notes (Signed)
[facility]  Subjective: Breathing better.  Objective: Vital signs in last 24 hours: Temp:  [98 F (36.7 C)-98.6 F (37 C)] 98.4 F (36.9 C) (05/09 0417) Pulse Rate:  [58-74] 63 (05/09 0417) Resp:  [14-24] 20 (05/09 0417) BP: (104-207)/(49-101) 162/49 mmHg (05/09 0417) SpO2:  [99 %-100 %] 100 % (05/09 0417) Weight:  [134 lb 11.2 oz (61.1 kg)-138 lb 10.7 oz (62.9 kg)] 134 lb 11.2 oz (61.1 kg) (05/09 0119) Last BM Date: 07/22/12  Intake/Output from previous day: 05/08 0701 - 05/09 0700 In: 360 [P.O.:360] Out: 1356  Intake/Output this shift:    Medications Current Facility-Administered Medications  Medication Dose Route Frequency Provider Last Rate Last Dose  . 0.9 %  sodium chloride infusion  250 mL Intravenous PRN Wilburt Finlay, PA-C      . 0.9 %  sodium chloride infusion  100 mL Intravenous PRN Maree Krabbe, MD      . 0.9 %  sodium chloride infusion  100 mL Intravenous PRN Maree Krabbe, MD      . acetaminophen (TYLENOL) tablet 650 mg  650 mg Oral Q4H PRN Wilburt Finlay, PA-C      . calcium acetate (PHOSLO) capsule 667 mg  667 mg Oral TID WC Wilburt Finlay, PA-C   667 mg at 07/24/12 0826  . darbepoetin (ARANESP) injection 100 mcg  100 mcg Intravenous Q Wed-HD Donald Pore, PA-C   100 mcg at 07/22/12 1522  . doxercalciferol (HECTOROL) injection 3 mcg  3 mcg Intravenous Q M,W,F-HD Donald Pore, PA-C   3 mcg at 07/22/12 1524  . feeding supplement (NEPRO CARB STEADY) liquid 237 mL  237 mL Oral PRN Maree Krabbe, MD      . ferric gluconate (NULECIT) 62.5 mg in sodium chloride 0.9 % 100 mL IVPB  62.5 mg Intravenous Q Wed-HD Donald Pore, PA-C   62.5 mg at 07/22/12 1612  . guaiFENesin-dextromethorphan (ROBITUSSIN DM) 100-10 MG/5ML syrup 5 mL  5 mL Oral Q4H PRN Marykay Lex, MD      . heparin injection 1,000 Units  1,000 Units Dialysis PRN Maree Krabbe, MD      . heparin injection 2,000 Units  2,000 Units Dialysis Once in dialysis  Maree Krabbe, MD      . hydrALAZINE (APRESOLINE) tablet 25 mg  25 mg Oral Q8H Marykay Lex, MD   25 mg at 07/24/12 0654  . isosorbide mononitrate (IMDUR) 24 hr tablet 30 mg  30 mg Oral Once Marykay Lex, MD      . isosorbide mononitrate (IMDUR) 24 hr tablet 60 mg  60 mg Oral Daily Marykay Lex, MD      . levothyroxine (SYNTHROID, LEVOTHROID) tablet 75 mcg  75 mcg Oral QAC breakfast Wilburt Finlay, PA-C   75 mcg at 07/24/12 0655  . lidocaine (PF) (XYLOCAINE) 1 % injection 5 mL  5 mL Intradermal PRN Maree Krabbe, MD      . lidocaine-prilocaine (EMLA) cream 1 application  1 application Topical PRN Maree Krabbe, MD      . multivitamin (RENA-VIT) tablet 1 tablet  1 tablet Oral QHS Wilburt Finlay, PA-C   1 tablet at 07/22/12 2157  . nebivolol (BYSTOLIC) tablet 10 mg  10 mg Oral Daily Marykay Lex, MD   10 mg at 07/23/12 1223  . ondansetron (ZOFRAN) injection 4 mg  4 mg Intravenous Q6H PRN Wilburt Finlay, PA-C      . pentafluoroprop-tetrafluoroeth (  GEBAUERS) aerosol 1 application  1 application Topical PRN Maree Krabbe, MD      . simvastatin (ZOCOR) tablet 10 mg  10 mg Oral q1800 Wilburt Finlay, PA-C   10 mg at 07/23/12 1713  . sodium chloride 0.9 % injection 3 mL  3 mL Intravenous Q12H Bryan Hager, PA-C      . sodium chloride 0.9 % injection 3 mL  3 mL Intravenous PRN Wilburt Finlay, PA-C      . Vitamin D (Ergocalciferol) (DRISDOL) capsule 50,000 Units  50,000 Units Oral Q Fri Wilburt Finlay, PA-C      . vitamin E capsule 400 Units  400 Units Oral Daily Wilburt Finlay, PA-C   400 Units at 07/23/12 1223  . Warfarin - Physician Dosing Inpatient   Does not apply q1800 Chrystie Nose, MD        PE: General appearance: alert, cooperative and no distress Neck: no JVD Lungs: clear to auscultation bilaterally Heart: regular rate and rhythm, S1, S2 normal, no murmur, click, rub or gallop Abdomen: Soft, Nontender. Extremities: No LEE Pulses: Radials 2+ and symmetric, 1+ pedal pulses Neurologic:  Grossly normal  Lab Results:   Recent Labs  07/21/12 1351 07/22/12 1200  WBC  --  7.2  HGB 8.3* 8.1*  HCT 26.3* 25.2*  PLT  --  374   BMET  Recent Labs  07/21/12 1351 07/22/12 1200  NA 136 134*  K 4.3 3.9  CL 95* 94*  CO2 27 27  GLUCOSE 204* 217*  BUN 17 30*  CREATININE 5.32* 7.59*  CALCIUM 9.1 9.4   PT/INR  Recent Labs  07/21/12 1351 07/22/12 1200  LABPROT 14.7 15.0  INR 1.17 1.20    Assessment/Plan   Principal Problem:   Acute on chronic systolic and diastolic heart failure, NYHA class 4 Active Problems:   ESRD (end stage renal disease)   HTN (hypertension)   DM2 (diabetes mellitus, type 2)   Hyperlipidemia   Anemia   Atrial fibrillation, spont convers NSR 06/26/12   Diastolic CHF, chronic   Breast cancer, s/p Lt mastectomy   Chronic anticoagulation  Plan:  Feels a lot better.  Still hypertensive, 160's.  HD today.  Net fluids: -3.7L since adm.  NSVT/short SVT on tele today.  Bystolic at 10mg .  Hydralazine 25mg  Q8, Imdur, zocor.   Guaifenesin/dextro for cough.   LOS: 4 days    HAGER, BRYAN 07/24/2012 10:02 AM  I have seen and evaluated the patient this AM along with Wilburt Finlay, PA. I agree with his findings, examination as well as impression recommendations.  Still requiring supplemental O2, despite essentially clear lungs.  BP still labile (?? Accuracy with checkin BP on leg while seated = better when checked supine).  -- added hydralazine yesterday  Plan per Nephrology was HD again today to shoot for lower dry wgt.  Afib - stable, but is having short bursts of rapid AF vs. PAT/SVT along with short bursts of VT -- with relatively normal EF, continue BB, but may need to consider invasive evaluation for coronary ischemia with cardiac cath since recent Nuclear Perfusion ST was negative for ischemia (? False negative with MV CAD), but denies any SSx of Angina.  Need to recheck ambulatory sats after HD today in order to determine her stability for  possible d/c.  Continue warfain  Still coughing, but better; had not received PRN cough suppressant.  ESRD with metabolic bone disease & access issue being treated by Nephrology service -- appreciate their close  attention. DM - stable CBGs in ~200s; at home, her Humalog is prn.  DISPO:  Reassess after HD today with O2 Sats -- may need to work on short term home O2.  If she continues to be hypoxic, more detailed invasive cardiac evaluation as well as pulmonary assessment may be needed.   Marykay Lex, M.D., M.S. [facility] [address] [place], [place]  [address]  [phone] Pager # [phone] 07/24/2012 10:40 AM

## 2012-07-24 NOTE — Progress Notes (Signed)
INITIAL NUTRITION ASSESSMENT  DOCUMENTATION CODES Per approved criteria  -Not Applicable   INTERVENTION:  Nepro Carb Steady daily (425 kcals, 19.1 gm protein per 8 fl oz bottle) RD to follow for nutrition care plan  NUTRITION DIAGNOSIS: Increased nutrient needs related to ESRD as evidenced by estimated nutrition needs  Goal: Oral intake with meals & supplements to meet >/= 90% of estimated nutrition needs  Monitor:  PO & supplemental intake, weight, labs, I/O's  Reason for Assessment: Malnutrition Screening Tool Report  79 y.o. female  Admitting Dx: Acute on chronic systolic and diastolic heart failure, NYHA class 4  ASSESSMENT: Patient with PMH significant for HTN, T2DM, CKD (HD: M,W,F), breast cancer, s/p mastectomy, and hypothyroidism; admitted to [facility] for pulmonary edema and Afib with RVR ---> CXR revealed minimial CHF, bilateral pleural effusions and atelectasis.  RD spoke with patient's daughter, Myratte "[name]" via telephone; reports her Mom usually eats well and monitors her fluid intake herself; her Mom's weight has been stable; per weight records, patient has lost ~ 15 lb (due to fluid); daughter amenable to RD ordering Nepro supplement.  Height: Ht Readings from Last 1 Encounters:  07/20/12 5\' 2"  (1.575 m)    Weight: Wt Readings from Last 1 Encounters:  07/24/12 134 lb 11.2 oz (61.1 kg)    Ideal Body Weight: 110 lb  % Ideal Body Weight: 121%  Wt Readings from Last 10 Encounters:  07/24/12 134 lb 11.2 oz (61.1 kg)  06/26/12 149 lb 7.6 oz (67.8 kg)  06/24/12 137 lb 12.6 oz (62.5 kg)  06/19/12 160 lb 7.9 oz (72.8 kg)  06/19/12 160 lb 7.9 oz (72.8 kg)  08/02/11 140 lb (63.504 kg)  07/12/11 141 lb 5 oz (64.1 kg)    Usual Body Weight: 160 lb  % Usual Body Weight: 84%  BMI:  Body mass index is 24.63 kg/(m^2).  Estimated Nutritional Needs: Kcal: 1600-1800 Protein: 75-85 gm Fluid: 1200 ml  Skin: Intact  Diet Order: Renal 80/90-04-19-1198  ml  EDUCATION NEEDS: -No education needs identified at this time   Intake/Output Summary (Last 24 hours) at 07/24/12 1408 Last data filed at 07/24/12 0800  Gross per 24 hour  Intake    480 ml  Output   1356 ml  Net   -876 ml    Last BM: 5/7  Labs:   Recent Labs Lab 07/20/12 1701 07/21/12 1351 07/22/12 1200  NA 142 136 134*  K 3.7 4.3 3.9  CL 98 95* 94*  CO2 28 27 27   BUN 35* 17 30*  CREATININE 9.22* 5.32* 7.59*  CALCIUM 9.2 9.1 9.4  GLUCOSE 116* 204* 217*    CBG (last 3)   Recent Labs  07/23/12 2056 07/24/12 0628 07/24/12 1127  GLUCAP 236* 211* 159*    Scheduled Meds: . calcium acetate  667 mg Oral TID WC  . darbepoetin (ARANESP) injection - DIALYSIS  100 mcg Intravenous Q Wed-HD  . doxercalciferol  3 mcg Intravenous Q M,W,F-HD  . ferric gluconate (FERRLECIT/NULECIT) IV  62.5 mg Intravenous Q Wed-HD  . heparin  2,000 Units Dialysis Once in dialysis  . hydrALAZINE  25 mg Oral Q8H  . isosorbide mononitrate  30 mg Oral Once  . isosorbide mononitrate  60 mg Oral Daily  . levothyroxine  75 mcg Oral QAC breakfast  . multivitamin  1 tablet Oral QHS  . nebivolol  10 mg Oral Daily  . simvastatin  10 mg Oral q1800  . sodium chloride  3 mL Intravenous Q12H  .  Vitamin D (Ergocalciferol)  50,000 Units Oral Q Fri  . vitamin E  400 Units Oral Daily  . Warfarin - Physician Dosing Inpatient   Does not apply q1800    Continuous Infusions:   Past Medical History  Diagnosis Date  . CHF (congestive heart failure)   . DM2 (diabetes mellitus, type 2)   . ESRD (end stage renal disease)     HD MWF - [facility]  . HTN (hypertension)   . Hyperlipidemia   . Tibia/fibula fracture 07/10/2011    right - nonunion repair 06/2012  . Hypothyroid   . Breast cancer   . Arthritis   . Hyperparathyroidism, secondary renal   . Atrial fibrillation dx 06/2012    on coumadin  . Shortness of breath     Past Surgical History  Procedure Laterality Date  . Mastectomy    . Abdominal  surgery    . Thyroid surgery    . Breast surgery Left     Mastectomy  . Eye surgery Bilateral     Cataract  . Ankle fusion Right   . Arteriovenous graft placement Right   . Orif tibia fracture Right 06/16/2012    Procedure: OPEN REDUCTION INTERNAL FIXATION (ORIF) TIBIA FRACTURE;  Surgeon: Budd Palmer, MD;  Location: [facility] OR;  Service: Orthopedics;  Laterality: Right;    Maureen Chatters, RD, LDN Pager #: [phone] After-Hours Pager #: [phone]

## 2012-07-24 NOTE — H&P (Signed)
HPI: [name] is an 79 y.o. female with ESRD. She has (R)UE AVF and has been having prolonged bleeding after treatments. She is chronic Coumadin, but INR has been subtherapeutic. She is currently admitted for pulm edema and Afib with RVR but is doing much better. IR is requested to do shuntogram to rule out stenosis as cause for prolonged bleeding. Shuntogram shows stenosis that will require intervention. She apparently has had PTA/shuntogram procedures previously in [place] and tolerates them well with local anesthesia. Chart, PMHx, and meds reviewed.  Past Medical History:  Past Medical History  Diagnosis Date  . CHF (congestive heart failure)   . DM2 (diabetes mellitus, type 2)   . ESRD (end stage renal disease)     HD MWF - [facility]  . HTN (hypertension)   . Hyperlipidemia   . Tibia/fibula fracture 07/10/2011    right - nonunion repair 06/2012  . Hypothyroid   . Breast cancer   . Arthritis   . Hyperparathyroidism, secondary renal   . Atrial fibrillation dx 06/2012    on coumadin  . Shortness of breath     Past Surgical History:  Past Surgical History  Procedure Laterality Date  . Mastectomy    . Abdominal surgery    . Thyroid surgery    . Breast surgery Left     Mastectomy  . Eye surgery Bilateral     Cataract  . Ankle fusion Right   . Arteriovenous graft placement Right   . Orif tibia fracture Right 06/16/2012    Procedure: OPEN REDUCTION INTERNAL FIXATION (ORIF) TIBIA FRACTURE;  Surgeon: Budd Palmer, MD;  Location: [facility] OR;  Service: Orthopedics;  Laterality: Right;    Family History:  Family History  Problem Relation Age of Onset  . Hyperlipidemia Father   . Diabetes Father   . Breast cancer Sister     Social History:  reports that she has never smoked. Her smokeless tobacco use includes Snuff. She reports that she does not drink alcohol or use illicit drugs.  Allergies:  Allergies  Allergen Reactions  . Codeine Swelling     Medications: Medications Prior to Admission  Medication Sig Dispense Refill  . acetaminophen (TYLENOL) 650 MG CR tablet Take 650 mg by mouth every 8 (eight) hours as needed for pain.      Marland Kitchen b complex-vitamin c-folic acid (NEPHRO-VITE) 0.8 MG TABS Take 0.8 mg by mouth at bedtime.      . calcium acetate (PHOSLO) 667 MG capsule Take 667 mg by mouth 3 (three) times daily with meals.      . isosorbide mononitrate (IMDUR) 30 MG 24 hr tablet Take 30 mg by mouth daily.      Marland Kitchen levothyroxine (SYNTHROID, LEVOTHROID) 75 MCG tablet Take 75 mcg by mouth daily before breakfast.      . nebivolol (BYSTOLIC) 2.5 MG tablet Take 2.5 mg by mouth daily.      Marland Kitchen oxyCODONE (OXY IR/ROXICODONE) 5 MG immediate release tablet Take 1 tablet (5 mg total) by mouth every 4 (four) hours as needed for pain.  30 tablet  0  . simvastatin (ZOCOR) 10 MG tablet Take 10 mg by mouth at bedtime.      . Vitamin D, Ergocalciferol, (DRISDOL) 50000 UNITS CAPS Take 50,000 Units by mouth every 7 (seven) days. Takes on Fridays.      . vitamin E 400 UNIT capsule Take 400 Units by mouth daily.      . insulin lispro protamine-lispro (HUMALOG 75/25) (75-25) 100 UNIT/ML  SUSP Inject 5-10 Units into the skin 2 (two) times daily as needed.      . warfarin (COUMADIN) 5 MG tablet Take 1.25-2.5 mg by mouth daily. 1.25 mg alternating with 2.5 mg daily        Please HPI for pertinent positives, otherwise complete 10 system ROS negative.  Physical Exam: Blood pressure 162/49, pulse 63, temperature 98.4 F (36.9 C), temperature source Oral, resp. rate 20, height 5\' 2"  (1.575 m), weight 134 lb 11.2 oz (61.1 kg), SpO2 100.00%. Body mass index is 24.63 kg/(m^2).   General Appearance:  Alert, cooperative, no distress, appears stated age  Head:  Normocephalic, without obvious abnormality, atraumatic  ENT: Unremarkable  Neck: Supple, symmetrical, trachea midline,   Lungs:   Clear to auscultation bilaterally, no w/r/r, respirations unlabored without use  of accessory muscles.  Chest Wall:  No tenderness or deformity  Heart:  Regular rate and rhythm, S1, S2 normal, no murmur, rub or gallop.   Extremities: Rt AVF accessed, palpable thrill.  Neurologic: Normal affect, no gross deficits.   Results for orders placed during the hospital encounter of 07/20/12 (from the past 48 hour(s))  GLUCOSE, CAPILLARY     Status: Abnormal   Collection Time    07/22/12 11:09 PM      Result Value Range   Glucose-Capillary 264 (*) 70 - 99 mg/dL  GLUCOSE, CAPILLARY     Status: Abnormal   Collection Time    07/23/12  6:27 AM      Result Value Range   Glucose-Capillary 113 (*) 70 - 99 mg/dL   Comment 1 Documented in Chart     Comment 2 Notify RN    GLUCOSE, CAPILLARY     Status: Abnormal   Collection Time    07/23/12 11:34 AM      Result Value Range   Glucose-Capillary 156 (*) 70 - 99 mg/dL   Comment 1 Documented in Chart     Comment 2 Notify RN    GLUCOSE, CAPILLARY     Status: Abnormal   Collection Time    07/23/12  8:56 PM      Result Value Range   Glucose-Capillary 236 (*) 70 - 99 mg/dL   Comment 1 Documented in Chart     Comment 2 Notify RN    GLUCOSE, CAPILLARY     Status: Abnormal   Collection Time    07/24/12  6:28 AM      Result Value Range   Glucose-Capillary 211 (*) 70 - 99 mg/dL  GLUCOSE, CAPILLARY     Status: Abnormal   Collection Time    07/24/12 11:27 AM      Result Value Range   Glucose-Capillary 159 (*) 70 - 99 mg/dL   Comment 1 Documented in Chart     Comment 2 Notify RN     No results found.  Assessment/Plan ESRD with prolonged bleeding from (R)UE AV access Stenosis on shuntogram amenable to angioplasty. Discussed procedure risks, complications. Consent signed in chart  Brayton El PA-C 07/24/2012, 1:34 PM

## 2012-07-24 NOTE — Progress Notes (Signed)
Subjective: SOB "a lot better" today  Objective Vital signs in last 24 hours: Filed Vitals:   07/24/12 0030 07/24/12 0119 07/24/12 0146 07/24/12 0417  BP: 165/68 149/77 177/76 162/49  Pulse: 61 66  63  Temp:  98.1 F (36.7 C)  98.4 F (36.9 C)  TempSrc:  Oral  Oral  Resp: 21 22  20   Height:      Weight:  61.1 kg (134 lb 11.2 oz)    SpO2:  100%  100%   Weight change: -1.6 kg (-3 lb 8.4 oz)  Intake/Output Summary (Last 24 hours) at 07/24/12 0858 Last data filed at 07/24/12 0119  Gross per 24 hour  Intake    360 ml  Output   1356 ml  Net   -996 ml   Labs: Basic Metabolic Panel:  Recent Labs Lab 07/20/12 1701 07/21/12 1351 07/22/12 1200  NA 142 136 134*  K 3.7 4.3 3.9  CL 98 95* 94*  CO2 28 27 27   GLUCOSE 116* 204* 217*  BUN 35* 17 30*  CREATININE 9.22* 5.32* 7.59*  CALCIUM 9.2 9.1 9.4   Liver Function Tests: No results found for this basename: AST, ALT, ALKPHOS, BILITOT, PROT, ALBUMIN,  in the last 168 hours No results found for this basename: LIPASE, AMYLASE,  in the last 168 hours No results found for this basename: AMMONIA,  in the last 168 hours CBC:  Recent Labs Lab 07/20/12 1701 07/21/12 1351 07/22/12 1200  WBC 10.2  --  7.2  NEUTROABS 7.3  --   --   HGB 8.3* 8.3* 8.1*  HCT 25.3* 26.3* 25.2*  MCV 92.3  --  92.0  PLT 421*  --  374   PT/INR: @LABRCNTIP (inr:5)   Scheduled Meds ) . calcium acetate  667 mg Oral TID WC  . darbepoetin (ARANESP) injection - DIALYSIS  100 mcg Intravenous Q Wed-HD  . doxercalciferol  3 mcg Intravenous Q M,W,F-HD  . ferric gluconate (FERRLECIT/NULECIT) IV  62.5 mg Intravenous Q Wed-HD  . heparin  2,000 Units Dialysis Once in dialysis  . hydrALAZINE  25 mg Oral Q8H  . isosorbide mononitrate  30 mg Oral Once  . isosorbide mononitrate  60 mg Oral Daily  . levothyroxine  75 mcg Oral QAC breakfast  . multivitamin  1 tablet Oral QHS  . nebivolol  10 mg Oral Daily  . simvastatin  10 mg Oral q1800  . sodium chloride  3  mL Intravenous Q12H  . Vitamin D (Ergocalciferol)  50,000 Units Oral Q Fri  . vitamin E  400 Units Oral Daily  . Warfarin - Physician Dosing Inpatient   Does not apply q1800    Physical Exam:  Blood pressure 162/49, pulse 63, temperature 98.4 F (36.9 C), temperature source Oral, resp. rate 20, height 5\' 2"  (1.575 m), weight 61.1 kg (134 lb 11.2 oz), SpO2 100.00%.  Gen: Alert, coughing off and on, not in distress Neck: Supple, +JVD Heart: RRR, 2/6 sem apex, no rub  Lungs: crackles L base, R clear Abdomen: bs pos. Soft, nontender  Extremities: trace edema of ankles Skin: Healed Right anle surgical sight. No overt rash or ulcer  Neuro: alert. Ox3, no acute focal deficits noted  Dialysis Access: Pos. Bruit Right U A AVF with mld swelling mid arm   Dialysis Orders: [facility] MWF 66kg  2K, 2.25Ca bath 3hrs Heparin 2000 RUA AVF BFR 325 Hectorol Epogen 4400 Venofer 50mg  weekly  Assessment/Plan  1. Pulm edema- getting volume down, cramping with  less than 2kg UF, symptoms improved however today. HD again later today. Concern for another process (cardiac ischemia) is raised when pulm edema present with difficulty removing fluid like this 2. ESRD, usual HD MWF. May need increased time to 4hr with HD 3. R/O access stenosis- had prolonged bleeding (6 hrs) post HD at outpt center recently acc to patient, pt on coumadin but says bleeding probs recently have been out of proportion. IR consult put in to eval fistula 4. Hypertension/volume - BP's high, Bystolic increased, reduce vol with HD as tolerated. Dry weight down 66 > 61 kg so far. HD again today, see above also 5. Atrial Fib with Rapid Vent Rate on admit = per Card. On Coumadin and now in Sinus 6. Anemia - hgb =9.4 aranesp q wed hd and weekly hd venofer 7. Metabolic bone disease - Binders / hectoral on hd 3.3 phos 8. Nutrition - renal diet and renavite  9. DM type 2 = Per admit/ on diet And Humalog As op  Vinson Moselle  MD [phone] pgr     [phone] cell 07/24/2012, 8:58 AM

## 2012-07-24 NOTE — Progress Notes (Signed)
Inpatient Diabetes Program Recommendations  AACE/ADA: New Consensus Statement on Inpatient Glycemic Control (2013)  Target Ranges:  Prepandial:   less than 140 mg/dL      Peak postprandial:   less than 180 mg/dL (1-2 hours)      Critically ill patients:  140 - 180 mg/dL  Results for DINITA, [name] (MRN [id number]) as of 07/24/2012 13:31  Ref. Range 07/23/2012 06:27 07/23/2012 11:34 07/23/2012 20:56 07/24/2012 06:28 07/24/2012 11:27  Glucose-Capillary Latest Range: 70-99 mg/dL 846 (H) 962 (H) 952 (H) 211 (H) 159 (H)    Inpatient Diabetes Program Recommendations Insulin - Basal: May need small amount of Lantus - 5 units QHS Correction (SSI): Add Novolog sensitive tidwc Patient takes 70/30 at home but PO intake not consistent during hospitalization. Thank you  Piedad Climes BSN, RN,CDE Inpatient Diabetes Coordinator [phone] (team pager)

## 2012-07-25 ENCOUNTER — Inpatient Hospital Stay (HOSPITAL_COMMUNITY): Payer: Medicare Other

## 2012-07-25 LAB — RENAL FUNCTION PANEL
Albumin: 3 g/dL — ABNORMAL LOW (ref 3.5–5.2)
BUN: 14 mg/dL (ref 6–23)
Chloride: 98 mEq/L (ref 96–112)
GFR calc non Af Amer: 7 mL/min — ABNORMAL LOW (ref >90)
Potassium: 4.2 mEq/L (ref 3.5–5.1)

## 2012-07-25 LAB — GLUCOSE, CAPILLARY
Glucose-Capillary: 117 mg/dL — ABNORMAL HIGH (ref 70–99)
Glucose-Capillary: 178 mg/dL — ABNORMAL HIGH (ref 70–99)

## 2012-07-25 LAB — PROTIME-INR: INR: 1.18 (ref 0.00–1.49)

## 2012-07-25 MED ORDER — DOXERCALCIFEROL 4 MCG/2ML IV SOLN: 3.0000 ug | INTRAVENOUS | Status: DC

## 2012-07-25 MED ORDER — DARBEPOETIN ALFA-POLYSORBATE 100 MCG/0.5ML IJ SOLN: 100.0000 ug | INTRAMUSCULAR | Status: DC

## 2012-07-25 MED ORDER — HYDRALAZINE HCL 25 MG PO TABS: 25.0000 mg | ORAL_TABLET | Freq: Three times a day (TID) | ORAL | Status: DC

## 2012-07-25 MED ORDER — NEPRO/CARBSTEADY PO LIQD: 237.0000 mL | ORAL | Status: DC | PRN

## 2012-07-25 MED ORDER — SODIUM CHLORIDE 0.9 % IV SOLN: 100.0000 mL | INTRAVENOUS | Status: DC | PRN

## 2012-07-25 MED ORDER — HEPARIN SODIUM (PORCINE) 1000 UNIT/ML DIALYSIS: 1000.0000 [IU] | INTRAMUSCULAR | Status: DC | PRN

## 2012-07-25 MED ORDER — WARFARIN - PHARMACIST DOSING INPATIENT: Freq: Every day | Status: DC

## 2012-07-25 MED ORDER — NEBIVOLOL HCL 10 MG PO TABS: 10.0000 mg | ORAL_TABLET | Freq: Every day | ORAL | Status: DC

## 2012-07-25 MED ORDER — WARFARIN SODIUM 2.5 MG PO TABS
2.5000 mg | ORAL_TABLET | Freq: Once | ORAL | Status: DC
  Filled 2012-07-25: qty 1

## 2012-07-25 MED ORDER — LIDOCAINE-PRILOCAINE 2.5-2.5 % EX CREA: 1.0000 "application " | TOPICAL_CREAM | CUTANEOUS | Status: DC | PRN

## 2012-07-25 MED ORDER — HEPARIN SODIUM (PORCINE) 1000 UNIT/ML DIALYSIS: 2000.0000 [IU] | Freq: Once | INTRAMUSCULAR | Status: DC

## 2012-07-25 MED ORDER — DOXERCALCIFEROL 4 MCG/2ML IV SOLN
INTRAVENOUS | Status: AC
  Administered 2012-07-25: 3 ug via INTRAVENOUS
  Filled 2012-07-25: qty 2

## 2012-07-25 MED ORDER — LIDOCAINE HCL (PF) 1 % IJ SOLN: 5.0000 mL | INTRAMUSCULAR | Status: DC | PRN

## 2012-07-25 MED ORDER — SODIUM CHLORIDE 0.9 % IV SOLN: 62.5000 mg | INTRAVENOUS | Status: DC

## 2012-07-25 MED ORDER — ALTEPLASE 2 MG IJ SOLR: 2.0000 mg | Freq: Once | INTRAMUSCULAR | Status: DC | PRN

## 2012-07-25 MED ORDER — PENTAFLUOROPROP-TETRAFLUOROETH EX AERO: 1.0000 "application " | INHALATION_SPRAY | CUTANEOUS | Status: DC | PRN

## 2012-07-25 NOTE — Progress Notes (Addendum)
Subjective: Breathing better, CXR is pending. RA Sat 100% sitting and 94% with ambulation  Objective Vital signs in last 24 hours: Filed Vitals:   07/25/12 0352 07/25/12 0415 07/25/12 0557 07/25/12 0608  BP: 169/54 183/62  154/67  Pulse: 60 66  64  Temp: 98.6 F (37 C)     TempSrc: Oral     Resp: 18 19  20   Height:      Weight: 61.3 kg (135 lb 2.3 oz)  61.3 kg (135 lb 2.3 oz)   SpO2: 100%   98%   Weight change: -1.6 kg (-3 lb 8.4 oz)  Intake/Output Summary (Last 24 hours) at 07/25/12 1013 Last data filed at 07/25/12 0352  Gross per 24 hour  Intake      0 ml  Output   1427 ml  Net  -1427 ml   Labs: Basic Metabolic Panel:  Recent Labs Lab 07/20/12 1701 07/21/12 1351 07/22/12 1200 07/25/12 0119  NA 142 136 134* 139  K 3.7 4.3 3.9 4.2  CL 98 95* 94* 98  CO2 28 27 27 29   GLUCOSE 116* 204* 217* 229*  BUN 35* 17 30* 14  CREATININE 9.22* 5.32* 7.59* 5.39*  CALCIUM 9.2 9.1 9.4 8.6  PHOS  --   --   --  3.1   Liver Function Tests:  Recent Labs Lab 07/25/12 0119  ALBUMIN 3.0*   No results found for this basename: LIPASE, AMYLASE,  in the last 168 hours No results found for this basename: AMMONIA,  in the last 168 hours CBC:  Recent Labs Lab 07/20/12 1701 07/21/12 1351 07/22/12 1200  WBC 10.2  --  7.2  NEUTROABS 7.3  --   --   HGB 8.3* 8.3* 8.1*  HCT 25.3* 26.3* 25.2*  MCV 92.3  --  92.0  PLT 421*  --  374   PT/INR: @LABRCNTIP (inr:5)   Scheduled Meds ) . calcium acetate  667 mg Oral TID WC  . darbepoetin (ARANESP) injection - DIALYSIS  100 mcg Intravenous Q Wed-HD  . doxercalciferol  3 mcg Intravenous Q M,W,F-HD  . feeding supplement (NEPRO CARB STEADY)  237 mL Oral Q1500  . ferric gluconate (FERRLECIT/NULECIT) IV  62.5 mg Intravenous Q Wed-HD  . [START ON 07/26/2012] heparin  2,000 Units Dialysis Once in dialysis  . hydrALAZINE  25 mg Oral Q8H  . isosorbide mononitrate  30 mg Oral Once  . isosorbide mononitrate  60 mg Oral Daily  . levothyroxine   75 mcg Oral QAC breakfast  . multivitamin  1 tablet Oral QHS  . nebivolol  10 mg Oral Daily  . simvastatin  10 mg Oral q1800  . sodium chloride  3 mL Intravenous Q12H  . Vitamin D (Ergocalciferol)  50,000 Units Oral Q Fri  . vitamin E  400 Units Oral Daily  . warfarin  2.5 mg Oral ONCE-1800  . Warfarin - Physician Dosing Inpatient   Does not apply q1800    Physical Exam:  Blood pressure 154/67, pulse 64, temperature 98.6 F (37 C), temperature source Oral, resp. rate 20, height 5\' 2"  (1.575 m), weight 61.3 kg (135 lb 2.3 oz), SpO2 98.00%.  Gen: Alert, coughing off and on, not in distress Neck: Supple, +JVD Heart: RRR, 2/6 sem apex, no rub  Lungs: clear to bases bilat Abdomen: bs pos. Soft, nontender  Extremities: trace edema of ankles Skin: Healed Right anle surgical sight. No overt rash or ulcer  Neuro: alert. Ox3, no acute focal deficits noted  Dialysis Access:  Pos. Bruit Right U A AVF with mld swelling mid arm   Dialysis Orders: [facility] MWF 66kg  2K, 2.25Ca bath 3hrs Heparin 2000 RUA AVF BFR 325   Hectorol Epogen 4400 Venofer 50mg  weekly  Assessment/Plan  1. SOB / pulm edema- wt down to 62kg (from 66kg prior dry wt) but cramping significantly with any attempt to remove further fluid. No longer hypoxemic, OK for discharge from renal standpoint. 2. Hypertension/volume - BP on high side, on inc'd bystolic. OK for discharge, will add BP meds as needed at outpt HD, would use ACEI next or CCB.  Unable to pull fluid below 62kg due to severe cramping, will set new EDW to 62.5kg.  3. ESRD, usual HD MWF- will increase time to 3 1/2 hr with HD, may need further increases if fluid control not adequate. Discussed with pt who agrees 4. S/P angioplasty of RUA access stenosis per IR 5/9 5. Afib / RVR- per Card. On Coumadin and now in Sinus, on BB also 6. Anemia - hgb =9.4 aranesp q wed hd and weekly hd venofer 7. Metabolic bone disease - Binders / hectoral on hd 3.3 phos 8. Nutrition -  renal diet and renavite  9. DM type 2 = Per admit/ on diet And Humalog As op  Vinson Moselle  MD [phone] pgr    [phone] cell 07/25/2012, 10:13 AM

## 2012-07-25 NOTE — Discharge Summary (Addendum)
I have seen and evaluated the patient this AM along with Corine Shelter, PA. I agree with his discharge summary.    Admitted with volume overload related Acute on chronic Systolic & Diastolic Heart Failure thought to be in part due to inadequate volume removal during HD & Hypertension.  She has been dialyzed to a new dry weight and her hypoxia / dyspnea has resolved.   She has a h/o Afib, that has not recurred as a sustained rhythm, but has had some brief episodes of NSVT & PSVT that have been asymptomatic.  Her BB dose was increased and afterload reducing agents were added for her persistently elevated BP.  She is ready for discharge today with plan TCM 7-10 f/u.  Marykay Lex, M.D., M.S. [facility] [address] [place], [place]  [address]  [phone] Pager # [phone] 07/25/2012 4:00 PM

## 2012-07-25 NOTE — Progress Notes (Signed)
Subjective:  Sitting up in bed, denies SOB at rest. O2 sat 98 on RA with ambulation.  Objective:  Vital Signs in the last 24 hours: Temp:  [98 F (36.7 C)-98.6 F (37 C)] 98.6 F (37 C) (05/10 0352) Pulse Rate:  [59-68] 64 (05/10 0608) Resp:  [17-20] 20 (05/10 0608) BP: (147-192)/(46-76) 154/67 mmHg (05/10 0608) SpO2:  [94 %-100 %] 94 % (05/10 1026) Weight:  [61.3 kg (135 lb 2.3 oz)] 61.3 kg (135 lb 2.3 oz) (05/10 0557)  Intake/Output from previous day:  Intake/Output Summary (Last 24 hours) at 07/25/12 1102 Last data filed at 07/25/12 0352  Gross per 24 hour  Intake      0 ml  Output   1427 ml  Net  -1427 ml    Physical Exam: General appearance: alert, cooperative, appears stated age and no distress Lungs: clear to auscultation bilaterally Heart: regular rate and rhythm   Rate: 64  Rhythm: normal sinus rhythm and 12 beats PSVT this am  Lab Results:  Recent Labs  07/22/12 1200  WBC 7.2  HGB 8.1*  PLT 374    Recent Labs  07/22/12 1200 07/25/12 0119  NA 134* 139  K 3.9 4.2  CL 94* 98  CO2 27 29  GLUCOSE 217* 229*  BUN 30* 14  CREATININE 7.59* 5.39*   No results found for this basename: TROPONINI, CK, MB,  in the last 72 hours Hepatic Function Panel  Recent Labs  07/25/12 0119  ALBUMIN 3.0*   No results found for this basename: CHOL,  in the last 72 hours  Recent Labs  07/25/12 0126  INR 1.18    Imaging: CXR ordered this am   Cardiac Studies: Echo 06/25/12- - Left ventricle: The cavity size was normal. There was mild concentric hypertrophy. Systolic function was mildly reduced. The estimated ejection fraction was in the range of 45% to 50%. Mild global hypokinesis. LV diastolic function cannot be assessed due to a-fib. The E/e' ratio is >10, suggesting elevated LV filling pressure. Ejection fraction: 45.7% (MOD, 1-plane).    Assessment/Plan:   Principal Problem:   Acute on chronic systolic and diastolic heart failure, NYHA class  4 Active Problems:   ESRD (end stage renal disease)   HTN (hypertension)   Anemia   Atrial fibrillation, spont convers NSR 06/26/12   Chronic anticoagulation   DM2 (diabetes mellitus, type 2)   Hyperlipidemia   Diastolic CHF, chronic   Breast cancer, s/p Lt mastectomy    PLAN: Review CXR when done. Possible discharge later today. She will be a TCM pt with 48hr phone call and early follow up. She is on Bystolic 10mg , consider adding Diltiazem for B/P and PSVT/PAF.  Corine Shelter PA-C Beeper [phone] 07/25/2012, 11:02 AM  I have seen and evaluated the patient this AM along with Corine Shelter, PA. I agree with his findings, examination as well as impression recommendations.  Finally have achieved stable O2 sats -- new dry wgt set for HD. -- appreciate the close & diligent care from our Renal colleagues. BP seems more stable.   Afib - stable, but is having short bursts of rapid AF vs. PAT/SVT along with short bursts of VT -- with relatively normal EF, continue BB Need to recheck ambulatory sats after HD today in order to determine her stability for possible d/c.  Continue warfain  Cough improved -- continue PRN robitussin. ESRD/ metabolic Bone Dz -- per Nephrology. Will need PCP to reassess DM control -- recommendations here are to consider  low dose basal insulin standing.  Has not been on insulin here & CBGs have ranged from 150-220.  She is otherwise ready for d/c.    Marykay Lex, M.D., M.S. [facility] [address] [place], [place]  [address]  [phone] Pager # [phone] 07/25/2012 12:11 PM

## 2012-07-25 NOTE — Discharge Summary (Signed)
Patient ID: [name],  MRN: [id number], DOB/AGE: 1928-10-24 79 y.o.  Admit date: 07/20/2012 Discharge date: 07/25/2012  Primary Care Provider:  Dr Felicity Coyer Primary Cardiologist:  Dr Rennis Golden  Discharge Diagnoses Principal Problem:   Acute on chronic systolic and diastolic heart failure, NYHA class 4 Active Problems:   ESRD (end stage renal disease)   HTN (hypertension)   Anemia   Atrial fibrillation, spont convers NSR 06/26/12   Chronic anticoagulation   DM2 (diabetes mellitus, type 2)   Hyperlipidemia   Diastolic CHF, chronic   Breast cancer, s/p Lt mastectomy     Hospital Course:  This is a 79 y.o. female who lives in [place] (but is staying in [place] with her daughter) with a past medical history significant for HTN, T2DM, CKD (HD: M,W,F), breast cancer, s/p mastectomy, and hypothyroidism. 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 She was followed by our practice during an admission for pulmonary edema, secondary to hypertensive emergency, in June 2011. An echo at that time revealed normal systolic function with an EF of 60-65%. No WMA. Mild aortic regurgitation, mild-moderated tricuspid regurgitation and moderately increased pulmonary pressures. Unfortunately, she was lost to follow-up. She presented to the [facility] ER on 06/24/12 for acute onset of SOB, which she contributes to too much fluid removal from dialysis. She had PAF then that converted before discharge. She was seen in follow-up on 07/17/12 in the office by Wilburt Finlay, PA-C. He felt that she may be having an acute exacerbation of CHF. A CXR was ordered, she was given a z-pack and a BNP was ordered. The CXR revealed minimial CHF, bilateral pleural effusions and atelectasis. Per the patient's report, she has had worsening non-productive cough now with the development of whitish sputum, not improved with antibiotics. She was admitted to [facility] 07/20/12 with acute on chronic CHF. She had some problems with bleeding  while on dialysis in the setting of an INR of 3.2. This stabilized as her INR stabilized. Her CHF was treated with HD and she was followed as well by the Renal service. She maintained NSR but did have runs of PSVT and PAF. Her new EDW is 62.5kg per Dr Arlean Hopping.   Discharge Vitals:  Blood pressure 154/67, pulse 64, temperature 98.6 F (37 C), temperature source Oral, resp. rate 20, height 5\' 2"  (1.575 m), weight 61.3 kg (135 lb 2.3 oz), SpO2 94.00%.    Labs: Results for orders placed during the hospital encounter of 07/20/12 (from the past 48 hour(s))  GLUCOSE, CAPILLARY     Status: Abnormal   Collection Time    07/23/12  8:56 PM      Result Value Range   Glucose-Capillary 236 (*) 70 - 99 mg/dL   Comment 1 Documented in Chart     Comment 2 Notify RN    GLUCOSE, CAPILLARY     Status: Abnormal   Collection Time    07/24/12  6:28 AM      Result Value Range   Glucose-Capillary 211 (*) 70 - 99 mg/dL  GLUCOSE, CAPILLARY     Status: Abnormal   Collection Time    07/24/12 11:27 AM      Result Value Range   Glucose-Capillary 159 (*) 70 - 99 mg/dL   Comment 1 Documented in Chart     Comment 2 Notify RN    GLUCOSE, CAPILLARY     Status: Abnormal   Collection Time    07/24/12  4:11 PM  Result Value Range   Glucose-Capillary 181 (*) 70 - 99 mg/dL   Comment 1 Documented in Chart     Comment 2 Notify RN    GLUCOSE, CAPILLARY     Status: Abnormal   Collection Time    07/24/12  9:15 PM      Result Value Range   Glucose-Capillary 166 (*) 70 - 99 mg/dL  RENAL FUNCTION PANEL     Status: Abnormal   Collection Time    07/25/12  1:19 AM      Result Value Range   Sodium 139  135 - 145 mEq/L   Potassium 4.2  3.5 - 5.1 mEq/L   Chloride 98  96 - 112 mEq/L   CO2 29  19 - 32 mEq/L   Glucose, Bld 229 (*) 70 - 99 mg/dL   BUN 14  6 - 23 mg/dL   Creatinine, Ser 4.09 (*) 0.50 - 1.10 mg/dL   Calcium 8.6  8.4 - 81.1 mg/dL   Phosphorus 3.1  2.3 - 4.6 mg/dL   Albumin 3.0 (*) 3.5 - 5.2 g/dL   GFR  calc non Af Amer 7 (*) >90 mL/min   GFR calc Af Amer 8 (*) >90 mL/min   Comment:            The eGFR has been calculated     using the CKD EPI equation.     This calculation has not been     validated in all clinical     situations.     eGFR's persistently     <90 mL/min signify     possible Chronic Kidney Disease.  PROTIME-INR     Status: None   Collection Time    07/25/12  1:26 AM      Result Value Range   Prothrombin Time 14.8  11.6 - 15.2 seconds   INR 1.18  0.00 - 1.49  GLUCOSE, CAPILLARY     Status: Abnormal   Collection Time    07/25/12  6:06 AM      Result Value Range   Glucose-Capillary 117 (*) 70 - 99 mg/dL  GLUCOSE, CAPILLARY     Status: Abnormal   Collection Time    07/25/12 11:24 AM      Result Value Range   Glucose-Capillary 178 (*) 70 - 99 mg/dL   Comment 1 Notify RN      Disposition: She is a TCM pt and will require early follow up     Follow-up Information   Follow up with Chrystie Nose, MD. (office will call you)    Contact information:   [address] [place] [address] [phone]       Discharge Medications:    Medication List    TAKE these medications       acetaminophen 650 MG CR tablet  Commonly known as:  TYLENOL  Take 650 mg by mouth every 8 (eight) hours as needed for pain.     b complex-vitamin c-folic acid 0.8 MG Tabs  Take 0.8 mg by mouth at bedtime.     calcium acetate 667 MG capsule  Commonly known as:  PHOSLO  Take 667 mg by mouth 3 (three) times daily with meals.     darbepoetin 100 MCG/0.5ML Soln  Commonly known as:  ARANESP  Inject 0.5 mLs (100 mcg total) into the vein every Wednesday with hemodialysis.     doxercalciferol 4 MCG/2ML injection  Commonly known as:  HECTOROL  Inject 1.5 mLs (3 mcg total)  into the vein every Monday, Wednesday, and Friday with hemodialysis.     hydrALAZINE 25 MG tablet  Commonly known as:  APRESOLINE  Take 1 tablet (25 mg total) by mouth every 8 (eight) hours.      insulin lispro protamine-lispro (75-25) 100 UNIT/ML Susp  Commonly known as:  HUMALOG 75/25  Inject 5-10 Units into the skin 2 (two) times daily as needed.     isosorbide mononitrate 30 MG 24 hr tablet  Commonly known as:  IMDUR  Take 30 mg by mouth daily.     levothyroxine 75 MCG tablet  Commonly known as:  SYNTHROID, LEVOTHROID  Take 75 mcg by mouth daily before breakfast.     nebivolol 10 MG tablet  Commonly known as:  BYSTOLIC  Take 1 tablet (10 mg total) by mouth daily.     oxyCODONE 5 MG immediate release tablet  Commonly known as:  Oxy IR/ROXICODONE  Take 1 tablet (5 mg total) by mouth every 4 (four) hours as needed for pain.     simvastatin 10 MG tablet  Commonly known as:  ZOCOR  Take 10 mg by mouth at bedtime.     sodium chloride 0.9 % SOLN 100 mL with ferric gluconate 12.5 MG/ML SOLN 62.5 mg  Inject 62.5 mg into the vein every Wednesday with hemodialysis.     Vitamin D (Ergocalciferol) 50000 UNITS Caps  Commonly known as:  DRISDOL  Take 50,000 Units by mouth every 7 (seven) days. Takes on Fridays.     vitamin E 400 UNIT capsule  Take 400 Units by mouth daily.     warfarin 5 MG tablet  Commonly known as:  COUMADIN  Take 1.25-2.5 mg by mouth daily. 1.25 mg alternating with 2.5 mg daily         Duration of Discharge Encounter: Greater than 30 minutes including physician time.  Jolene Provost PA-C 07/25/2012 12:43 PM

## 2012-07-25 NOTE — Progress Notes (Signed)
ANTICOAGULATION CONSULT NOTE - Initial Consult  Pharmacy Consult for Coumadin Indication: atrial fibrillation  Allergies  Allergen Reactions  . Codeine Swelling    Patient Measurements: Height: 5\' 2"  (157.5 cm) Weight: 134 lb 11.2 oz (61.1 kg) IBW/kg (Calculated) : 50.1   Vital Signs: Temp: 98.2 F (36.8 C) (05/10 0044) Temp src: Oral (05/10 0044) BP: 170/60 mmHg (05/10 0300) Pulse Rate: 64 (05/10 0300)  Labs:  Recent Labs  07/22/12 1200 07/25/12 0119 07/25/12 0126  HGB 8.1*  --   --   HCT 25.2*  --   --   PLT 374  --   --   LABPROT 15.0  --  14.8  INR 1.20  --  1.18  CREATININE 7.59* 5.39*  --     Estimated Creatinine Clearance: 7 ml/min (by C-G formula based on Cr of 5.39).   Assessment: On Coumadin PTA for Afib.  Patient reports that her dose was recently changed due to bleeding at HD.  Her latest PTA dose was 2.5 mg alternating with 1.25 mg per her report.  INR 1.18 drawn with HD early this morning.   Goal of Therapy:  INR = 2 Monitor platelets by anticoagulation protocol: Yes   Plan:  - Coumadn 2.5mg  x 1 - Daily PT/INR  Bayard Hugger, PharmD, BCPS  Clinical Pharmacist  Pager: [phone]   07/25/2012 4:01 AM

## 2012-07-27 ENCOUNTER — Telehealth: Payer: Self-pay | Admitting: Physician Assistant

## 2012-07-27 NOTE — Telephone Encounter (Signed)
Left message with daughter [name] about appoint date and time fro [name].

## 2012-07-29 ENCOUNTER — Encounter: Payer: Self-pay | Admitting: Internal Medicine

## 2012-07-30 ENCOUNTER — Ambulatory Visit (INDEPENDENT_AMBULATORY_CARE_PROVIDER_SITE_OTHER): Payer: Medicare Other | Admitting: Physician Assistant

## 2012-07-30 ENCOUNTER — Encounter: Payer: Self-pay | Admitting: Physician Assistant

## 2012-07-30 ENCOUNTER — Ambulatory Visit: Payer: Medicare Other | Admitting: Internal Medicine

## 2012-07-30 ENCOUNTER — Ambulatory Visit (INDEPENDENT_AMBULATORY_CARE_PROVIDER_SITE_OTHER): Payer: Medicare Other | Admitting: Pharmacist Clinician (PhC)/ Clinical Pharmacy Specialist

## 2012-07-30 VITALS — BP 142/78 | HR 65 | Ht 62.0 in | Wt 140.0 lb

## 2012-07-30 DIAGNOSIS — I5042 Chronic combined systolic (congestive) and diastolic (congestive) heart failure: Secondary | ICD-10-CM | POA: Insufficient documentation

## 2012-07-30 DIAGNOSIS — I10 Essential (primary) hypertension: Secondary | ICD-10-CM

## 2012-07-30 DIAGNOSIS — I4891 Unspecified atrial fibrillation: Secondary | ICD-10-CM

## 2012-07-30 DIAGNOSIS — Z7901 Long term (current) use of anticoagulants: Secondary | ICD-10-CM

## 2012-07-30 DIAGNOSIS — E785 Hyperlipidemia, unspecified: Secondary | ICD-10-CM

## 2012-07-30 NOTE — Patient Instructions (Signed)
Your physician recommends that you schedule a follow-up appointment in: in 3 months with Dr Rennis Golden

## 2012-07-30 NOTE — Assessment & Plan Note (Signed)
Well-controlled hydralazine 25 mg twice daily BuSpar 10 mg daily Imdur 30 mg

## 2012-07-30 NOTE — Progress Notes (Signed)
Date:  07/30/2012   ID:  ERNEST ORR, DOB [birth year], MRN [id number]  PCP:  Rene Paci, MD  Primary Cardiologist:  Healthy  The patient is an 79 y.o. female who lives in [place] (but is staying in [place] with her daughter) with a past medical history significant for HTN, T2DM, CKD (HD: M,W,F), breast cancer, s/p mastectomy, and hypothyroidism. 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 She was followed by our practice during an admission for pulmonary edema, secondary to hypertensive emergency, in June 2011. An echo at that time revealed normal systolic function with an EF of 60-65%. No WMA. Mild aortic regurgitation, mild-moderated tricuspid regurgitation and moderately increased pulmonary pressures. Unfortunately, she was lost to follow-up. She presented to the [facility] ER on 06/24/12 for acute onset of SOB, which she contributes to too much fluid removal from dialysis. She had PAF then that converted before discharge. She was seen in follow-up on 07/17/12 in the office by Wilburt Finlay, PA-C. He felt that she may be having an acute exacerbation of CHF. A CXR was ordered, she was given a z-pack and a BNP was ordered. The CXR revealed minimial CHF, bilateral pleural effusions and atelectasis. Per the patient's report, she has had worsening non-productive cough now with the development of whitish sputum, not improved with antibiotics. She was admitted to [facility] 07/20/12 with acute on chronic CHF. She had some problems with bleeding while on dialysis in the setting of an INR of 3.2. This stabilized as her INR stabilized. Her CHF was treated with HD and she was followed as well by the Renal service. She maintained NSR but did have runs of PSVT and PAF. Her new EDW is 62.5kg per Dr Arlean Hopping.  Patient reports today in followup to her recent hospitalization. She states that she feels much better. She still is complaining of a mild cough which is productive however it has decreased considerably  from prior. She denies nausea, vomiting, fever, chest pain, shortness of breath, PND, orthopnea, lower extremity edema, dizziness.  Stasis even recently cleared by her orthopedist to start walking again after having surgery on her right lower extremity.      Wt Readings from Last 3 Encounters:  07/30/12 140 lb (63.504 kg)  07/25/12 135 lb 2.3 oz (61.3 kg)  06/26/12 149 lb 7.6 oz (67.8 kg)     Past Medical History  Diagnosis Date  . CHF (congestive heart failure)   . DM2 (diabetes mellitus, type 2)   . ESRD (end stage renal disease)     HD MWF - [facility]  . HTN (hypertension)   . Hyperlipidemia   . Tibia/fibula fracture 07/10/2011    right - nonunion repair 06/2012  . Hypothyroid   . Breast cancer   . Arthritis   . Hyperparathyroidism, secondary renal   . Shortness of breath   . Atrial fibrillation dx 06/2012    on coumadin  . Atrial fibrillation 06/25/2012    2D Echo - EF 45-50, left atrium severely dilated, mitral valve heavily calcified annulus with calcification of the subvalvular apparatus    Current Outpatient Prescriptions  Medication Sig Dispense Refill  . acetaminophen (TYLENOL) 650 MG CR tablet Take 650 mg by mouth every 8 (eight) hours as needed for pain.      Marland Kitchen azithromycin (ZITHROMAX) 250 MG tablet       . b complex-vitamin c-folic acid (NEPHRO-VITE) 0.8 MG TABS Take 0.8 mg by mouth at bedtime.      Marland Kitchen  calcium acetate (PHOSLO) 667 MG capsule Take 667 mg by mouth 3 (three) times daily with meals.      . darbepoetin (ARANESP) 100 MCG/0.5ML SOLN Inject 0.5 mLs (100 mcg total) into the vein every Wednesday with hemodialysis.  4.2 mL  6  . doxercalciferol (HECTOROL) 4 MCG/2ML injection Inject 1.5 mLs (3 mcg total) into the vein every Monday, Wednesday, and Friday with hemodialysis.  2 mL    . hydrALAZINE (APRESOLINE) 25 MG tablet Take 1 tablet (25 mg total) by mouth every 8 (eight) hours.  100 tablet  6  . insulin lispro protamine-lispro (HUMALOG 75/25) (75-25) 100 UNIT/ML  SUSP Inject 5-10 Units into the skin 2 (two) times daily as needed.      . isosorbide mononitrate (IMDUR) 30 MG 24 hr tablet Take 30 mg by mouth daily.      Marland Kitchen levothyroxine (SYNTHROID, LEVOTHROID) 75 MCG tablet Take 75 mcg by mouth daily before breakfast.      . nebivolol (BYSTOLIC) 10 MG tablet Take 1 tablet (10 mg total) by mouth daily.  30 tablet  6  . oxyCODONE (OXY IR/ROXICODONE) 5 MG immediate release tablet Take 1 tablet (5 mg total) by mouth every 4 (four) hours as needed for pain.  30 tablet  0  . simvastatin (ZOCOR) 10 MG tablet Take 10 mg by mouth at bedtime.      . sodium chloride 0.9 % SOLN 100 mL with ferric gluconate 12.5 MG/ML SOLN 62.5 mg Inject 62.5 mg into the vein every Wednesday with hemodialysis.      . Vitamin D, Ergocalciferol, (DRISDOL) 50000 UNITS CAPS Take 50,000 Units by mouth every 7 (seven) days. Takes on Fridays.      . vitamin E 400 UNIT capsule Take 400 Units by mouth daily.      Marland Kitchen warfarin (COUMADIN) 5 MG tablet Take 1.25-2.5 mg by mouth daily. 1.25 mg alternating with 2.5 mg daily       No current facility-administered medications for this visit.    Allergies:    Allergies  Allergen Reactions  . Codeine Swelling  . Coreg (Carvedilol) Other (See Comments)    Headaches  . Lopressor (Metoprolol Tartrate) Other (See Comments)    GI upset    Social History:  The patient  reports that she has never smoked. Her smokeless tobacco use includes Snuff. She reports that she does not drink alcohol or use illicit drugs.   ROS:  Please see the history of present illness.    All other systems reviewed and negative.   PHYSICAL EXAM: VS:  BP 142/78  Pulse 65  Ht 5\' 2"  (1.575 m)  Wt 140 lb (63.504 kg)  BMI 25.6 kg/m2 Well nourished, well developed, in no acute distress HEENT: Pupils equal round reactive to accommodation extraocular movements are intact Neck: no JVD Cardiac:  normal S1, split S2; RRR; no murmur Lungs:  clear to auscultation bilaterally, no  wheezing, rhonchi or rales Ext: Trace of right lower extremity edema.  None on the left Skin: warm and dry Neuro:  Grossly normal  EKG:  Normal sinus rhythm nonspecific ST-T wave changes biphasic P wave in leads V. 1 and 2. Possible Florentina Jenny   ASSESSMENT AND PLAN:  Problem List Items Addressed This Visit   HTN (hypertension) (Chronic)     Well-controlled hydralazine 25 mg twice daily BuSpar 10 mg daily Imdur 30 mg    Hyperlipidemia (Chronic)     On Zocor    Chronic anticoagulation (Chronic)  Coumadin INR checked this visit.    Chronic combined systolic and diastolic heart failure EF 45-50% by echo 06/25/2012 - Primary     Patient appears euvolemic and is very close to discharge weight.     Other Visit Diagnoses   Essential hypertension

## 2012-07-30 NOTE — Assessment & Plan Note (Signed)
Patient appears euvolemic and is very close to discharge weight.

## 2012-07-30 NOTE — Assessment & Plan Note (Signed)
Coumadin INR checked this visit.

## 2012-07-30 NOTE — Assessment & Plan Note (Signed)
On Zocor 

## 2012-08-12 ENCOUNTER — Other Ambulatory Visit: Payer: Self-pay | Admitting: Pharmacist Clinician (PhC)/ Clinical Pharmacy Specialist

## 2012-08-12 DIAGNOSIS — Z7901 Long term (current) use of anticoagulants: Secondary | ICD-10-CM

## 2012-08-12 DIAGNOSIS — I4891 Unspecified atrial fibrillation: Secondary | ICD-10-CM

## 2012-08-13 ENCOUNTER — Ambulatory Visit (INDEPENDENT_AMBULATORY_CARE_PROVIDER_SITE_OTHER): Payer: Medicare Other | Admitting: Physician Assistant

## 2012-08-13 ENCOUNTER — Ambulatory Visit (INDEPENDENT_AMBULATORY_CARE_PROVIDER_SITE_OTHER): Payer: Medicare Other | Admitting: Pharmacist Clinician (PhC)/ Clinical Pharmacy Specialist

## 2012-08-13 ENCOUNTER — Encounter: Payer: Self-pay | Admitting: Physician Assistant

## 2012-08-13 VITALS — BP 132/72 | HR 67 | Ht 62.0 in | Wt 139.4 lb

## 2012-08-13 DIAGNOSIS — I4891 Unspecified atrial fibrillation: Secondary | ICD-10-CM

## 2012-08-13 DIAGNOSIS — Z7901 Long term (current) use of anticoagulants: Secondary | ICD-10-CM

## 2012-08-13 DIAGNOSIS — R079 Chest pain, unspecified: Secondary | ICD-10-CM

## 2012-08-13 DIAGNOSIS — I10 Essential (primary) hypertension: Secondary | ICD-10-CM

## 2012-08-13 DIAGNOSIS — R0789 Other chest pain: Secondary | ICD-10-CM | POA: Insufficient documentation

## 2012-08-13 DIAGNOSIS — I5042 Chronic combined systolic (congestive) and diastolic (congestive) heart failure: Secondary | ICD-10-CM

## 2012-08-13 LAB — POCT INR: INR: 4.3

## 2012-08-13 MED ORDER — WARFARIN SODIUM 5 MG PO TABS: ORAL_TABLET | ORAL | Status: DC

## 2012-08-13 NOTE — Assessment & Plan Note (Signed)
INR check in clinic 4.3.  See Pharmacist note for medication adjustments.

## 2012-08-13 NOTE — Progress Notes (Signed)
Date:  08/13/2012   ID:  [name], DOB 10/08/1928, MRN [id number]  PCP:  [name] Paci, MD  Primary Cardiologist:  Healthy  The patient is an 79 y.o. female who lives in [place] (but is staying in [place] with her daughter) with a past medical history significant for HTN, T2DM, CKD (HD: M,W,F), breast cancer, s/p mastectomy, and hypothyroidism. 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 She was followed by our practice during an admission for pulmonary edema, secondary to hypertensive emergency, in June 2011. An echo at that time revealed normal systolic function with an EF of 60-65%. No WMA. Mild aortic regurgitation, mild-moderated tricuspid regurgitation and moderately increased pulmonary pressures. Unfortunately, she was lost to follow-up. She presented to the [facility] ER on 06/24/12 for acute onset of SOB, which she contributes to too much fluid removal from dialysis. She had PAF then that converted before discharge. She was seen in follow-up on 07/17/12 in the office by [name] Finlay, PA-C. He felt that she may be having an acute exacerbation of CHF. A CXR was ordered, she was given a z-pack and a BNP was ordered. The CXR revealed minimial CHF, bilateral pleural effusions and atelectasis. Per the patient's report, she has had worsening non-productive cough now with the development of whitish sputum, not improved with antibiotics. She was admitted to [facility] 07/20/12 with acute on chronic CHF. She had some problems with bleeding while on dialysis in the setting of an INR of 3.2. This stabilized as her INR stabilized. Her CHF was treated with HD and she was followed as well by the Renal service. She maintained NSR but did have runs of PSVT and PAF. Her new EDW is 62.5kg per Dr [name].  Patient presents today with pain under her left breast laterally in the mid axillary line and also the left side of her neck. She reports the pain is worse in the left side when she's in the left lateral  decubitus position. Pain improves when she stands up. Reports no nausea vomiting or fever no diaphoresis no shortness of breath no orthopnea PND, hematuria hematochezia abdominal pain. she does have some mild right lower extreme edema of his history of recent ORIF of the right tibia.  The patient just underwent nuclear stress test 06/25/2012 showed no evidence of pharmacologically induced myocardial ischemia. Left ventricular ejection fraction was 60%.  2-D echocardiogram during the same timeframe reported EF of 45-50% with mild global hypokinesis.      Wt Readings from Last 3 Encounters:  08/13/12 139 lb 6.4 oz (63.231 kg)  07/30/12 140 lb (63.504 kg)  07/25/12 135 lb 2.3 oz (61.3 kg)     Past Medical History  Diagnosis Date  . CHF (congestive heart failure)   . DM2 (diabetes mellitus, type 2)   . ESRD (end stage renal disease)     HD MWF - [facility]  . HTN (hypertension)   . Hyperlipidemia   . Tibia/fibula fracture 07/10/2011    right - nonunion repair 06/2012  . Hypothyroid   . Breast cancer   . Arthritis   . Hyperparathyroidism, secondary renal   . Shortness of breath   . Atrial fibrillation dx 06/2012    on coumadin  . Atrial fibrillation 06/25/2012    2D Echo - EF 45-50, left atrium severely dilated, mitral valve heavily calcified annulus with calcification of the subvalvular apparatus    Current Outpatient Prescriptions  Medication Sig Dispense Refill  . acetaminophen (TYLENOL) 650 MG CR  tablet Take 650 mg by mouth every 8 (eight) hours as needed for pain.      Marland Kitchen b complex-vitamin c-folic acid (NEPHRO-VITE) 0.8 MG TABS Take 0.8 mg by mouth at bedtime.      . calcium acetate (PHOSLO) 667 MG capsule Take 667 mg by mouth 3 (three) times daily with meals.      . darbepoetin (ARANESP) 100 MCG/0.5ML SOLN Inject 0.5 mLs (100 mcg total) into the vein every Wednesday with hemodialysis.  4.2 mL  6  . doxercalciferol (HECTOROL) 4 MCG/2ML injection Inject 1.5 mLs (3 mcg total) into the  vein every Monday, Wednesday, and Friday with hemodialysis.  2 mL    . hydrALAZINE (APRESOLINE) 25 MG tablet Take 1 tablet (25 mg total) by mouth every 8 (eight) hours.  100 tablet  6  . insulin lispro protamine-lispro (HUMALOG 75/25) (75-25) 100 UNIT/ML SUSP Inject 5-10 Units into the skin 2 (two) times daily as needed.      . isosorbide mononitrate (IMDUR) 30 MG 24 hr tablet Take 30 mg by mouth daily.      Marland Kitchen levothyroxine (SYNTHROID, LEVOTHROID) 75 MCG tablet Take 75 mcg by mouth daily before breakfast.      . nebivolol (BYSTOLIC) 10 MG tablet Take 1 tablet (10 mg total) by mouth daily.  30 tablet  6  . oxyCODONE (OXY IR/ROXICODONE) 5 MG immediate release tablet Take 1 tablet (5 mg total) by mouth every 4 (four) hours as needed for pain.  30 tablet  0  . simvastatin (ZOCOR) 10 MG tablet Take 10 mg by mouth at bedtime.      . sodium chloride 0.9 % SOLN 100 mL with ferric gluconate 12.5 MG/ML SOLN 62.5 mg Inject 62.5 mg into the vein every Wednesday with hemodialysis.      . Vitamin D, Ergocalciferol, (DRISDOL) 50000 UNITS CAPS Take 50,000 Units by mouth every 7 (seven) days. Takes on Fridays.      . vitamin E 400 UNIT capsule Take 400 Units by mouth daily.      Marland Kitchen warfarin (COUMADIN) 5 MG tablet Take 1.25-2.5 mg by mouth daily. 1.25 mg alternating with 2.5 mg daily       No current facility-administered medications for this visit.    Allergies:    Allergies  Allergen Reactions  . Codeine Swelling  . Coreg (Carvedilol) Other (See Comments)    Headaches  . Lopressor (Metoprolol Tartrate) Other (See Comments)    GI upset    Social History:  The patient  reports that she has never smoked. Her smokeless tobacco use includes Snuff. She reports that she does not drink alcohol or use illicit drugs.   ROS:  Please see the history of midaxillary line also the left side of her neck illness.    All other systems reviewed and negative.   PHYSICAL EXAM: VS:  BP 132/72  Pulse 67  Ht 5\' 2"  (1.575 m)   Wt 139 lb 6.4 oz (63.231 kg)  BMI 25.49 kg/m2 Well nourished, well developed, in no acute distress HEENT: Pupils equal round reactive to accommodation extraocular movements are intact Neck: no JVD Chest: Nontender to palpation areas patient is experiencing pain: Mid axillary line left side, left side of her neck and under her left breast. Not exacerbated with active motion, neck flexion or rotation Cardiac:  normal S1, split S2; RRR; no murmur Lungs:  clear to auscultation bilaterally, no wheezing, rhonchi or rales Ext: Trace of right lower extremity edema.  None on the left  Skin: warm and dry Neuro:  Grossly normal  EKG: Rate 67 beats per minute Normal sinus rhythm nonspecific ST-T wave changes, biphasic P wave in leads V. 1 and 2. Possible atrial enlargement  ASSESSMENT AND PLAN:  Problem List Items Addressed This Visit   HTN (hypertension) (Chronic)     Blood pressure well controlled on current medical therapy    Chronic anticoagulation (Chronic)     INR check in clinic 4.3.  See Pharmacist note for medication adjustments.     Atrial fibrillation, spont convers NSR 06/26/12     Currently maintaining sinus rhythm with occasional PAC T-wave abnormalities 3 and aVF.    Chronic combined systolic and diastolic heart failure EF 45-50% by echo 06/25/2012     Is euvolemic well compensated at this time.    Chest pain, atypical - Primary     Chest pain, which is actually more left lower rib pain and left axillary, is clearly positional in nature. Patient reached a recent nuclear stress test which was nonischemic February 2014. Recommended some milligrams twice daily ibuprofen 5 days.     Other Visit Diagnoses   Chest pain        Relevant Orders       EKG 12-Lead

## 2012-08-13 NOTE — Assessment & Plan Note (Signed)
Is euvolemic well compensated at this time.

## 2012-08-13 NOTE — Assessment & Plan Note (Signed)
Blood pressure well controlled on current medical therapy 

## 2012-08-13 NOTE — Assessment & Plan Note (Signed)
Chest pain, which is actually more left lower rib pain and left axillary, is clearly positional in nature. Patient reached a recent nuclear stress test which was nonischemic February 2014. Recommended some milligrams twice daily ibuprofen 5 days.

## 2012-08-13 NOTE — Patient Instructions (Signed)
Ibuprofen 600 mg twice a day for 5 days

## 2012-08-13 NOTE — Assessment & Plan Note (Signed)
Currently maintaining sinus rhythm with occasional PAC T-wave abnormalities 3 and aVF.

## 2012-08-17 ENCOUNTER — Encounter: Payer: Self-pay | Admitting: Physician Assistant

## 2012-08-18 ENCOUNTER — Other Ambulatory Visit: Payer: Self-pay | Admitting: *Deleted

## 2012-08-18 ENCOUNTER — Other Ambulatory Visit: Payer: Self-pay | Admitting: Pharmacist Clinician (PhC)/ Clinical Pharmacy Specialist

## 2012-08-18 DIAGNOSIS — I4891 Unspecified atrial fibrillation: Secondary | ICD-10-CM

## 2012-08-18 DIAGNOSIS — Z7901 Long term (current) use of anticoagulants: Secondary | ICD-10-CM

## 2012-08-18 MED ORDER — WARFARIN SODIUM 5 MG PO TABS: ORAL_TABLET | ORAL | Status: DC

## 2012-08-25 ENCOUNTER — Encounter (INDEPENDENT_AMBULATORY_CARE_PROVIDER_SITE_OTHER): Payer: Self-pay

## 2012-10-21 ENCOUNTER — Other Ambulatory Visit: Payer: Self-pay

## 2012-11-30 ENCOUNTER — Ambulatory Visit: Payer: Self-pay | Admitting: Pharmacist Clinician (PhC)/ Clinical Pharmacy Specialist

## 2012-11-30 DIAGNOSIS — I4891 Unspecified atrial fibrillation: Secondary | ICD-10-CM

## 2012-11-30 DIAGNOSIS — Z7901 Long term (current) use of anticoagulants: Secondary | ICD-10-CM

## 2013-01-21 ENCOUNTER — Other Ambulatory Visit: Payer: Self-pay

## 2013-05-10 ENCOUNTER — Telehealth: Payer: Self-pay | Admitting: *Deleted

## 2013-05-10 NOTE — Telephone Encounter (Signed)
Patient phoned requesting for Lorre Nick to return her call.  States she's having UTI sxs and wants PCP to phone in something for it.    CB# [phone]

## 2013-05-11 MED ORDER — AMPICILLIN 250 MG PO CAPS: 250.0000 mg | ORAL_CAPSULE | Freq: Three times a day (TID) | ORAL | Status: DC

## 2013-05-11 NOTE — Telephone Encounter (Signed)
Ampicillin tid x 1 week - erx done OV if unimproved or worsening symptoms

## 2013-05-11 NOTE — Telephone Encounter (Signed)
Phoned pharmacy and d/c'ed ampicillin per MD

## 2013-09-08 IMAGING — CR DG CHEST 2V
2 series · 2 of 2 positions shown · non-contrast
Comparison: Two-view chest x-ray 07/10/2011, 07/14/2010.

CLINICAL DATA: Cough.  Shortness of breath.  Current history of
hypertension and diabetes.  Prior history of CHF.  Orthopedic
surgery on the right distal tibia on 06/16/2012.

CHEST - 2 VIEW

[w chest lat]
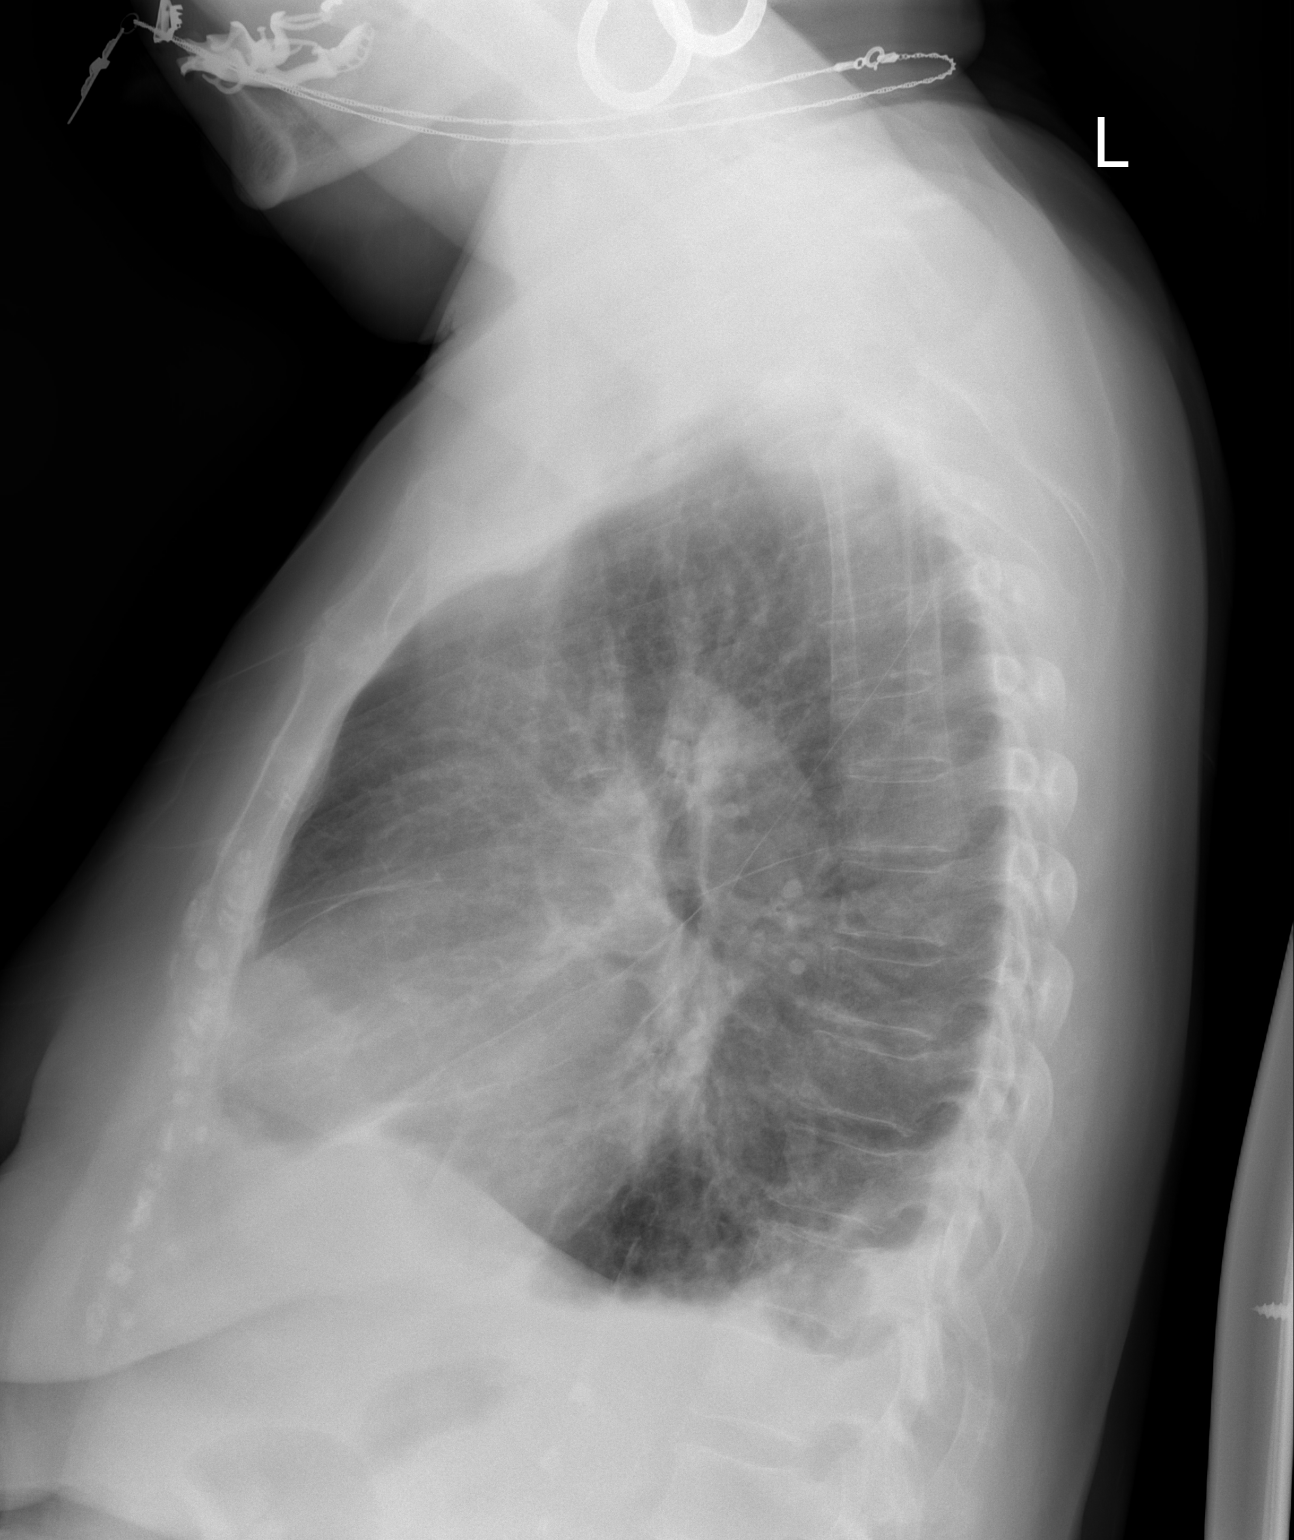

[view not recorded]
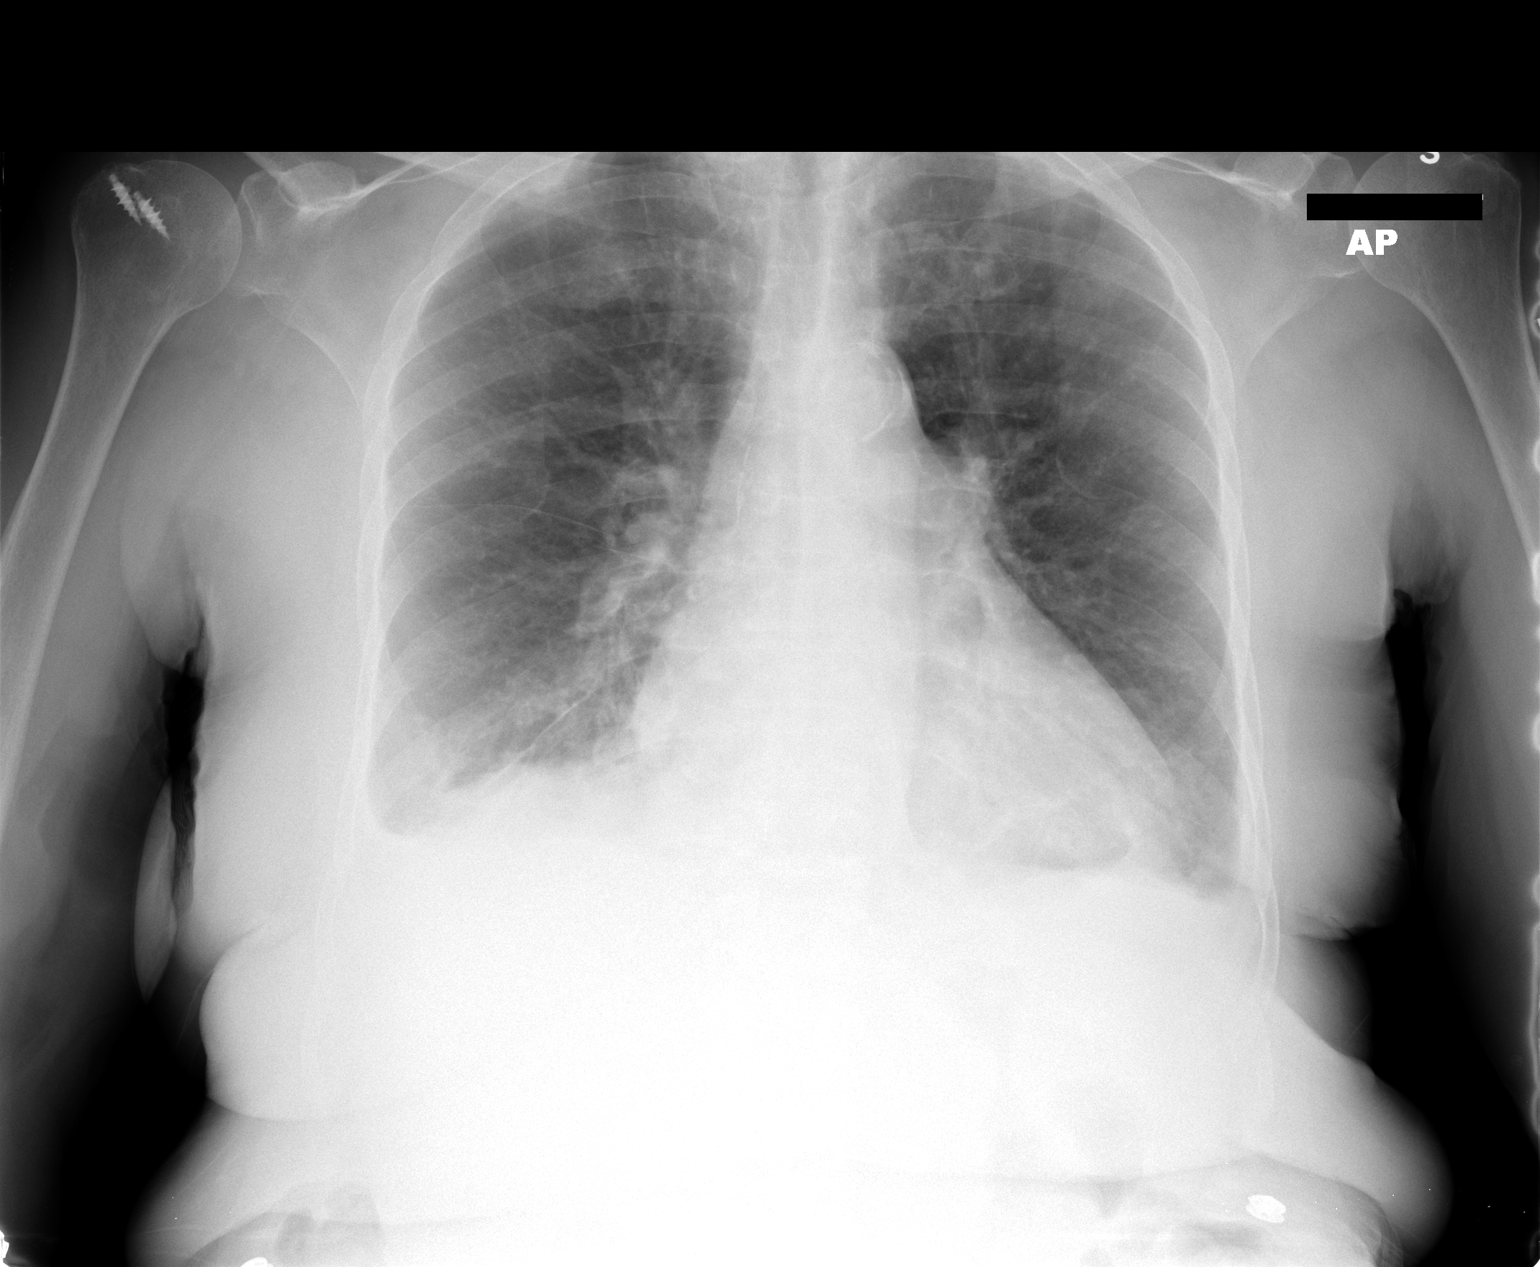

[2 of 2 positions shown; findings below may reference images not displayed]

FINDINGS: Cardiac silhouette moderately enlarged but stable.
Thoracic aorta atherosclerotic, unchanged.  Hilar and mediastinal
contours otherwise unremarkable. Pulmonary venous hypertension with
perhaps minimal/incipient interstitial pulmonary edema.  Bilateral
pleural effusions, right greater than left, and associated mild
consolidation in the lower lobes.  Visualized bony thorax intact.
IMPRESSION: Minimal/incipient CHF.  Bilateral pleural effusions, right greater
than left, with associated passive atelectasis in the lower lobes.

## 2013-11-25 ENCOUNTER — Telehealth: Payer: Self-pay | Admitting: Nurse Practitioner

## 2013-11-25 ENCOUNTER — Other Ambulatory Visit (INDEPENDENT_AMBULATORY_CARE_PROVIDER_SITE_OTHER): Payer: Medicare Other

## 2013-11-25 ENCOUNTER — Encounter: Payer: Self-pay | Admitting: Nurse Practitioner

## 2013-11-25 ENCOUNTER — Ambulatory Visit: Payer: Medicare Other | Admitting: Internal Medicine

## 2013-11-25 ENCOUNTER — Telehealth: Payer: Self-pay

## 2013-11-25 ENCOUNTER — Ambulatory Visit (INDEPENDENT_AMBULATORY_CARE_PROVIDER_SITE_OTHER): Payer: Medicare Other | Admitting: Nurse Practitioner

## 2013-11-25 ENCOUNTER — Ambulatory Visit (INDEPENDENT_AMBULATORY_CARE_PROVIDER_SITE_OTHER)
Admission: RE | Admit: 2013-11-25 | Discharge: 2013-11-25 | Disposition: A | Discharge status: Home / Self Care | Payer: Medicare Other | Source: Ambulatory Visit | Attending: Nurse Practitioner | Admitting: Nurse Practitioner

## 2013-11-25 VITALS — BP 146/64 | HR 65 | Temp 98.4°F | Ht 62.0 in | Wt 147.5 lb

## 2013-11-25 DIAGNOSIS — R5383 Other fatigue: Secondary | ICD-10-CM

## 2013-11-25 DIAGNOSIS — R0602 Shortness of breath: Secondary | ICD-10-CM

## 2013-11-25 DIAGNOSIS — R5381 Other malaise: Secondary | ICD-10-CM

## 2013-11-25 DIAGNOSIS — I509 Heart failure, unspecified: Secondary | ICD-10-CM

## 2013-11-25 LAB — BASIC METABOLIC PANEL
BUN: 18 mg/dL (ref 6–23)
CO2: 28 meq/L (ref 19–32)
CREATININE: 6.3 mg/dL — AB (ref 0.4–1.2)
Calcium: 9 mg/dL (ref 8.4–10.5)
Chloride: 102 mEq/L (ref 96–112)
GFR: 8.18 mL/min — CL (ref >60.00)
Glucose, Bld: 255 mg/dL — ABNORMAL HIGH (ref 70–99)
Potassium: 3.6 mEq/L (ref 3.5–5.1)
Sodium: 141 mEq/L (ref 135–145)

## 2013-11-25 LAB — CBC WITH DIFFERENTIAL/PLATELET
BASOS ABS: 0 10*3/uL (ref 0.0–0.1)
Basophils Relative: 0.8 % (ref 0.0–3.0)
EOS PCT: 3 % (ref 0.0–5.0)
Eosinophils Absolute: 0.2 10*3/uL (ref 0.0–0.7)
HEMATOCRIT: 31.4 % — AB (ref 36.0–46.0)
Hemoglobin: 10.1 g/dL — ABNORMAL LOW (ref 12.0–15.0)
LYMPHS ABS: 1 10*3/uL (ref 0.7–4.0)
LYMPHS PCT: 15.6 % (ref 12.0–46.0)
MCHC: 32.4 g/dL (ref 30.0–36.0)
MCV: 100.8 fl — ABNORMAL HIGH (ref 78.0–100.0)
MONOS PCT: 9.4 % (ref 3.0–12.0)
Monocytes Absolute: 0.6 10*3/uL (ref 0.1–1.0)
NEUTROS PCT: 71.2 % (ref 43.0–77.0)
Neutro Abs: 4.4 10*3/uL (ref 1.4–7.7)
PLATELETS: 271 10*3/uL (ref 150.0–400.0)
RBC: 3.11 Mil/uL — ABNORMAL LOW (ref 3.87–5.11)
RDW: 19.9 % — ABNORMAL HIGH (ref 11.5–15.5)
WBC: 6.2 10*3/uL (ref 4.0–10.5)

## 2013-11-25 LAB — BRAIN NATRIURETIC PEPTIDE: PRO B NATRI PEPTIDE: 2896 pg/mL — AB (ref 0.0–100.0)

## 2013-11-25 LAB — TSH: TSH: 6.36 u[IU]/mL — ABNORMAL HIGH (ref 0.35–4.50)

## 2013-11-25 MED ORDER — LEVOTHYROXINE SODIUM 75 MCG PO TABS: 75.0000 ug | ORAL_TABLET | Freq: Every day | ORAL | Status: DC

## 2013-11-25 MED ORDER — CALCIUM ACETATE 667 MG PO CAPS: 667.0000 mg | ORAL_CAPSULE | Freq: Three times a day (TID) | ORAL | Status: DC

## 2013-11-25 MED ORDER — ALBUTEROL SULFATE HFA 108 (90 BASE) MCG/ACT IN AERS
2.0000 | INHALATION_SPRAY | Freq: Four times a day (QID) | RESPIRATORY_TRACT | Status: DC | PRN
Start: 2013-11-25 — End: 2014-01-06

## 2013-11-25 NOTE — Assessment & Plan Note (Signed)
Will check chest x ray in view of recent shortness of breath complaints. Check labs BNP also

## 2013-11-25 NOTE — Telephone Encounter (Signed)
Called pharmacy and cancelled rx for albuterol per Steva Colder.

## 2013-11-25 NOTE — Progress Notes (Signed)
Pre visit review using our clinic review tool, if applicable. No additional management support is needed unless otherwise documented below in the visit note. 

## 2013-11-25 NOTE — Telephone Encounter (Signed)
Recvd phone call stating the results of the Cxr and it concluded that there is rounded density in the right middle lobe. This is more prominent on the lateral view compared with prior studies.   It is recommended she have a CT with contrast be completed to rule out mass lesion. Small right effusion.  Patient was seen in office today for increasing shortness of breath and cough.  Please advise regarding coordination of CT scan and her dialysis schedule  Thank you, Steva Colder NP
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
## 2013-11-25 NOTE — Patient Instructions (Signed)
Go to ER if shortness of breath worsens Get chest xray and labs drawn now.  Follow up with  Physician in 1 week Call clinic with questions or concerns    Shortness of Breath Shortness of breath means you have trouble breathing. It could also mean that you have a medical problem. You should get immediate medical care for shortness of breath. CAUSES   Not enough oxygen in the air such as with high altitudes or a smoke-filled room.  Certain lung diseases, infections, or problems.  Heart disease or conditions, such as angina or heart failure.  Low red blood cells (anemia).  Poor physical fitness, which can cause shortness of breath when you exercise.  Chest or back injuries or stiffness.  Being overweight.  Smoking.  Anxiety, which can make you feel like you are not getting enough air. DIAGNOSIS  Serious medical problems can often be found during your physical exam. Tests may also be done to determine why you are having shortness of breath. Tests may include:  Chest X-rays.  Lung function tests.  Blood tests.  An electrocardiogram (ECG).  An ambulatory electrocardiogram. An ambulatory ECG records your heartbeat patterns over a 24-hour period.  Exercise testing.  A transthoracic echocardiogram (TTE). During echocardiography, sound waves are used to evaluate how blood flows through your heart.  A transesophageal echocardiogram (TEE).  Imaging scans. Your health care provider may not be able to find a cause for your shortness of breath after your exam. In this case, it is important to have a follow-up exam with your health care provider as directed.  TREATMENT  Treatment for shortness of breath depends on the cause of your symptoms and can vary greatly. HOME CARE INSTRUCTIONS   Do not smoke. Smoking is a common cause of shortness of breath. If you smoke, ask for help to quit.  Avoid being around chemicals or things that may bother your breathing, such as paint fumes and  dust.  Rest as needed. Slowly resume your usual activities.  If medicines were prescribed, take them as directed for the full length of time directed. This includes oxygen and any inhaled medicines.  Keep all follow-up appointments as directed by your health care provider. SEEK MEDICAL CARE IF:   Your condition does not improve in the time expected.  You have a hard time doing your normal activities even with rest.  You have any new symptoms. SEEK IMMEDIATE MEDICAL CARE IF:   Your shortness of breath gets worse.  You feel light-headed, faint, or develop a cough not controlled with medicines.  You start coughing up blood.  You have pain with breathing.  You have chest pain or pain in your arms, shoulders, or abdomen.  You have a fever.  You are unable to walk up stairs or exercise the way you normally do. MAKE SURE YOU:  Understand these instructions.  Will watch your condition.  Will get help right away if you are not doing well or get worse. Document Released: 11/27/2000 Document Revised: 03/09/2013 Document Reviewed: 05/20/2011 [facility] Patient Information 2015 [place], [place]. This information is not intended to replace advice given to you by your health care provider. Make sure you discuss any questions you have with your health care provider.

## 2013-11-25 NOTE — Assessment & Plan Note (Signed)
Home glucose monitoring am 115, PM 190-200.  Patient uses sliding scale insulin regimen.  Continue low carbohydrate diet

## 2013-11-25 NOTE — Telephone Encounter (Signed)
Phone call from [facility] [place] lab. She states patient's creatinine is 6.3 and GFR is 8.18

## 2013-11-26 ENCOUNTER — Other Ambulatory Visit: Payer: Self-pay | Admitting: Nurse Practitioner

## 2013-11-26 DIAGNOSIS — R918 Other nonspecific abnormal finding of lung field: Secondary | ICD-10-CM

## 2013-11-26 DIAGNOSIS — N186 End stage renal disease: Secondary | ICD-10-CM

## 2013-11-26 NOTE — Assessment & Plan Note (Signed)
Patient continues hemodialysis 3 times weekly.  Patient is seen at [facility].  Phone [phone].  Instructed CT scan scheduler that [facility] must be called once CT scan is scheduled so they can make necessary adjustments for dialysis regarding CT contrast that will be used.

## 2013-12-02 ENCOUNTER — Ambulatory Visit (INDEPENDENT_AMBULATORY_CARE_PROVIDER_SITE_OTHER)
Admission: RE | Admit: 2013-12-02 | Discharge: 2013-12-02 | Disposition: A | Discharge status: Home / Self Care | Payer: Medicare Other | Source: Ambulatory Visit | Attending: Nurse Practitioner | Admitting: Nurse Practitioner

## 2013-12-02 DIAGNOSIS — R918 Other nonspecific abnormal finding of lung field: Secondary | ICD-10-CM

## 2013-12-02 DIAGNOSIS — R222 Localized swelling, mass and lump, trunk: Secondary | ICD-10-CM

## 2013-12-02 MED ORDER — IOHEXOL 300 MG/ML  SOLN
80.0000 mL | Freq: Once | INTRAMUSCULAR | Status: AC | PRN
  Administered 2013-12-02: 80 mL via INTRAVENOUS

## 2013-12-02 NOTE — Telephone Encounter (Signed)
LVM for pt to call back and schedule a follow to discuss the result of the CT scan.

## 2013-12-06 ENCOUNTER — Encounter: Payer: Self-pay | Admitting: Nurse Practitioner

## 2013-12-07 ENCOUNTER — Other Ambulatory Visit: Payer: Self-pay | Admitting: Nurse Practitioner

## 2013-12-07 MED ORDER — CALCIUM ACETATE 667 MG PO CAPS: 667.0000 mg | ORAL_CAPSULE | Freq: Three times a day (TID) | ORAL | Status: DC

## 2013-12-09 NOTE — Telephone Encounter (Signed)
LVM for pt to call back and schedule f/u appt with MD regarding sx of SOB

## 2013-12-28 ENCOUNTER — Other Ambulatory Visit: Payer: Self-pay | Admitting: Internal Medicine

## 2013-12-28 ENCOUNTER — Other Ambulatory Visit (INDEPENDENT_AMBULATORY_CARE_PROVIDER_SITE_OTHER): Payer: Medicare Other

## 2013-12-28 ENCOUNTER — Encounter: Payer: Self-pay | Admitting: Internal Medicine

## 2013-12-28 ENCOUNTER — Ambulatory Visit (INDEPENDENT_AMBULATORY_CARE_PROVIDER_SITE_OTHER): Payer: Medicare Other | Admitting: Internal Medicine

## 2013-12-28 VITALS — BP 130/80 | HR 72 | Temp 98.4°F | Resp 15 | Wt 147.5 lb

## 2013-12-28 DIAGNOSIS — R0600 Dyspnea, unspecified: Secondary | ICD-10-CM

## 2013-12-28 DIAGNOSIS — R7989 Other specified abnormal findings of blood chemistry: Secondary | ICD-10-CM

## 2013-12-28 DIAGNOSIS — Z923 Personal history of irradiation: Secondary | ICD-10-CM

## 2013-12-28 DIAGNOSIS — I5042 Chronic combined systolic (congestive) and diastolic (congestive) heart failure: Secondary | ICD-10-CM

## 2013-12-28 DIAGNOSIS — R799 Abnormal finding of blood chemistry, unspecified: Secondary | ICD-10-CM

## 2013-12-28 DIAGNOSIS — Z9889 Other specified postprocedural states: Secondary | ICD-10-CM

## 2013-12-28 LAB — BRAIN NATRIURETIC PEPTIDE: Pro B Natriuretic peptide (BNP): 3433 pg/mL — ABNORMAL HIGH (ref 0.0–100.0)

## 2013-12-28 LAB — TSH: TSH: 4.65 u[IU]/mL — ABNORMAL HIGH (ref 0.35–4.50)
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
## 2013-12-28 NOTE — Patient Instructions (Signed)
Your next office appointment will be determined based upon review of your pending labs . Those instructions will be transmitted to you through My Chart .  Followup as needed for your acute issue. Please report any significant change in your symptoms. 

## 2013-12-28 NOTE — Assessment & Plan Note (Signed)
Recheck TSH 

## 2013-12-28 NOTE — Assessment & Plan Note (Signed)
Repeat BNP

## 2013-12-28 NOTE — Progress Notes (Signed)
Pre visit review using our clinic review tool, if applicable. No additional management support is needed unless otherwise documented below in the visit note. 

## 2013-12-29 ENCOUNTER — Telehealth: Payer: Self-pay | Admitting: Internal Medicine

## 2013-12-29 NOTE — Telephone Encounter (Signed)
EMMI EMAILED  °

## 2014-01-06 ENCOUNTER — Ambulatory Visit (INDEPENDENT_AMBULATORY_CARE_PROVIDER_SITE_OTHER): Payer: Medicare Other | Admitting: Cardiology

## 2014-01-06 ENCOUNTER — Encounter: Payer: Self-pay | Admitting: Cardiology

## 2014-01-06 VITALS — BP 199/84 | HR 70 | Ht 62.0 in | Wt 147.7 lb

## 2014-01-06 DIAGNOSIS — Z7901 Long term (current) use of anticoagulants: Secondary | ICD-10-CM

## 2014-01-06 DIAGNOSIS — E1122 Type 2 diabetes mellitus with diabetic chronic kidney disease: Secondary | ICD-10-CM

## 2014-01-06 DIAGNOSIS — R799 Abnormal finding of blood chemistry, unspecified: Secondary | ICD-10-CM

## 2014-01-06 DIAGNOSIS — I5042 Chronic combined systolic (congestive) and diastolic (congestive) heart failure: Secondary | ICD-10-CM

## 2014-01-06 DIAGNOSIS — R7989 Other specified abnormal findings of blood chemistry: Secondary | ICD-10-CM | POA: Insufficient documentation

## 2014-01-06 DIAGNOSIS — I10 Essential (primary) hypertension: Secondary | ICD-10-CM

## 2014-01-06 DIAGNOSIS — I480 Paroxysmal atrial fibrillation: Secondary | ICD-10-CM

## 2014-01-06 DIAGNOSIS — N189 Chronic kidney disease, unspecified: Secondary | ICD-10-CM

## 2014-01-06 DIAGNOSIS — N186 End stage renal disease: Secondary | ICD-10-CM
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
## 2014-01-06 NOTE — Assessment & Plan Note (Addendum)
Her Coumadin had been stopped by her MD in [place], she is on ASA 81 mg

## 2014-01-06 NOTE — Assessment & Plan Note (Signed)
Referred by Dr Linna Darner

## 2014-01-06 NOTE — Assessment & Plan Note (Addendum)
NSR today 

## 2014-01-06 NOTE — Assessment & Plan Note (Addendum)
I re checked her B/P using radial pulse- 722 systolic.

## 2014-01-06 NOTE — Patient Instructions (Signed)
Your physician recommends that you schedule a follow-up appointment in: 6 Months with Dr Debara Pickett

## 2014-01-06 NOTE — Assessment & Plan Note (Addendum)
No CHF on exam, her dyspnea has resolved.

## 2014-01-06 NOTE — Assessment & Plan Note (Addendum)
Followed by PCP

## 2014-01-06 NOTE — Assessment & Plan Note (Addendum)
HD MWF °

## 2014-03-14 ENCOUNTER — Encounter: Payer: Self-pay | Admitting: Internal Medicine

## 2014-03-19 DIAGNOSIS — E1129 Type 2 diabetes mellitus with other diabetic kidney complication: Secondary | ICD-10-CM | POA: Diagnosis not present

## 2014-03-19 DIAGNOSIS — D631 Anemia in chronic kidney disease: Secondary | ICD-10-CM | POA: Diagnosis not present

## 2014-03-19 DIAGNOSIS — N2581 Secondary hyperparathyroidism of renal origin: Secondary | ICD-10-CM | POA: Diagnosis not present

## 2014-03-19 DIAGNOSIS — N186 End stage renal disease: Secondary | ICD-10-CM | POA: Diagnosis not present

## 2014-03-21 DIAGNOSIS — N186 End stage renal disease: Secondary | ICD-10-CM | POA: Diagnosis not present

## 2014-03-21 DIAGNOSIS — N2581 Secondary hyperparathyroidism of renal origin: Secondary | ICD-10-CM | POA: Diagnosis not present

## 2014-03-21 DIAGNOSIS — D631 Anemia in chronic kidney disease: Secondary | ICD-10-CM | POA: Diagnosis not present

## 2014-03-21 DIAGNOSIS — E1129 Type 2 diabetes mellitus with other diabetic kidney complication: Secondary | ICD-10-CM | POA: Diagnosis not present

## 2014-03-23 DIAGNOSIS — N186 End stage renal disease: Secondary | ICD-10-CM | POA: Diagnosis not present

## 2014-03-23 DIAGNOSIS — E1129 Type 2 diabetes mellitus with other diabetic kidney complication: Secondary | ICD-10-CM | POA: Diagnosis not present

## 2014-03-23 DIAGNOSIS — N2581 Secondary hyperparathyroidism of renal origin: Secondary | ICD-10-CM | POA: Diagnosis not present

## 2014-03-23 DIAGNOSIS — D631 Anemia in chronic kidney disease: Secondary | ICD-10-CM | POA: Diagnosis not present

## 2014-03-25 DIAGNOSIS — D631 Anemia in chronic kidney disease: Secondary | ICD-10-CM | POA: Diagnosis not present

## 2014-03-25 DIAGNOSIS — E1129 Type 2 diabetes mellitus with other diabetic kidney complication: Secondary | ICD-10-CM | POA: Diagnosis not present

## 2014-03-25 DIAGNOSIS — N2581 Secondary hyperparathyroidism of renal origin: Secondary | ICD-10-CM | POA: Diagnosis not present

## 2014-03-25 DIAGNOSIS — N186 End stage renal disease: Secondary | ICD-10-CM | POA: Diagnosis not present

## 2014-03-28 DIAGNOSIS — E1129 Type 2 diabetes mellitus with other diabetic kidney complication: Secondary | ICD-10-CM | POA: Diagnosis not present

## 2014-03-28 DIAGNOSIS — N186 End stage renal disease: Secondary | ICD-10-CM | POA: Diagnosis not present

## 2014-03-28 DIAGNOSIS — N2581 Secondary hyperparathyroidism of renal origin: Secondary | ICD-10-CM | POA: Diagnosis not present

## 2014-03-28 DIAGNOSIS — D631 Anemia in chronic kidney disease: Secondary | ICD-10-CM | POA: Diagnosis not present

## 2014-03-30 DIAGNOSIS — D631 Anemia in chronic kidney disease: Secondary | ICD-10-CM | POA: Diagnosis not present

## 2014-03-30 DIAGNOSIS — N2581 Secondary hyperparathyroidism of renal origin: Secondary | ICD-10-CM | POA: Diagnosis not present

## 2014-03-30 DIAGNOSIS — N186 End stage renal disease: Secondary | ICD-10-CM | POA: Diagnosis not present

## 2014-03-30 DIAGNOSIS — E1129 Type 2 diabetes mellitus with other diabetic kidney complication: Secondary | ICD-10-CM | POA: Diagnosis not present

## 2014-04-01 DIAGNOSIS — D631 Anemia in chronic kidney disease: Secondary | ICD-10-CM | POA: Diagnosis not present

## 2014-04-01 DIAGNOSIS — N2581 Secondary hyperparathyroidism of renal origin: Secondary | ICD-10-CM | POA: Diagnosis not present

## 2014-04-01 DIAGNOSIS — N186 End stage renal disease: Secondary | ICD-10-CM | POA: Diagnosis not present

## 2014-04-01 DIAGNOSIS — E1129 Type 2 diabetes mellitus with other diabetic kidney complication: Secondary | ICD-10-CM | POA: Diagnosis not present

## 2014-04-04 DIAGNOSIS — N2581 Secondary hyperparathyroidism of renal origin: Secondary | ICD-10-CM | POA: Diagnosis not present

## 2014-04-04 DIAGNOSIS — E1129 Type 2 diabetes mellitus with other diabetic kidney complication: Secondary | ICD-10-CM | POA: Diagnosis not present

## 2014-04-04 DIAGNOSIS — D631 Anemia in chronic kidney disease: Secondary | ICD-10-CM | POA: Diagnosis not present

## 2014-04-04 DIAGNOSIS — N186 End stage renal disease: Secondary | ICD-10-CM | POA: Diagnosis not present

## 2014-04-06 DIAGNOSIS — N2581 Secondary hyperparathyroidism of renal origin: Secondary | ICD-10-CM | POA: Diagnosis not present

## 2014-04-06 DIAGNOSIS — N186 End stage renal disease: Secondary | ICD-10-CM | POA: Diagnosis not present

## 2014-04-06 DIAGNOSIS — E1129 Type 2 diabetes mellitus with other diabetic kidney complication: Secondary | ICD-10-CM | POA: Diagnosis not present

## 2014-04-06 DIAGNOSIS — D631 Anemia in chronic kidney disease: Secondary | ICD-10-CM | POA: Diagnosis not present

## 2014-04-08 DIAGNOSIS — N2581 Secondary hyperparathyroidism of renal origin: Secondary | ICD-10-CM | POA: Diagnosis not present

## 2014-04-08 DIAGNOSIS — D631 Anemia in chronic kidney disease: Secondary | ICD-10-CM | POA: Diagnosis not present

## 2014-04-08 DIAGNOSIS — E1129 Type 2 diabetes mellitus with other diabetic kidney complication: Secondary | ICD-10-CM | POA: Diagnosis not present

## 2014-04-08 DIAGNOSIS — N186 End stage renal disease: Secondary | ICD-10-CM | POA: Diagnosis not present

## 2014-04-11 DIAGNOSIS — N186 End stage renal disease: Secondary | ICD-10-CM | POA: Diagnosis not present

## 2014-04-11 DIAGNOSIS — D631 Anemia in chronic kidney disease: Secondary | ICD-10-CM | POA: Diagnosis not present

## 2014-04-11 DIAGNOSIS — E1129 Type 2 diabetes mellitus with other diabetic kidney complication: Secondary | ICD-10-CM | POA: Diagnosis not present

## 2014-04-11 DIAGNOSIS — N2581 Secondary hyperparathyroidism of renal origin: Secondary | ICD-10-CM | POA: Diagnosis not present

## 2014-04-13 DIAGNOSIS — N2581 Secondary hyperparathyroidism of renal origin: Secondary | ICD-10-CM | POA: Diagnosis not present

## 2014-04-13 DIAGNOSIS — N186 End stage renal disease: Secondary | ICD-10-CM | POA: Diagnosis not present

## 2014-04-13 DIAGNOSIS — E1129 Type 2 diabetes mellitus with other diabetic kidney complication: Secondary | ICD-10-CM | POA: Diagnosis not present

## 2014-04-13 DIAGNOSIS — D631 Anemia in chronic kidney disease: Secondary | ICD-10-CM | POA: Diagnosis not present

## 2014-04-15 DIAGNOSIS — D631 Anemia in chronic kidney disease: Secondary | ICD-10-CM | POA: Diagnosis not present

## 2014-04-15 DIAGNOSIS — N2581 Secondary hyperparathyroidism of renal origin: Secondary | ICD-10-CM | POA: Diagnosis not present

## 2014-04-15 DIAGNOSIS — E1129 Type 2 diabetes mellitus with other diabetic kidney complication: Secondary | ICD-10-CM | POA: Diagnosis not present

## 2014-04-15 DIAGNOSIS — N186 End stage renal disease: Secondary | ICD-10-CM | POA: Diagnosis not present

## 2014-04-17 DIAGNOSIS — N186 End stage renal disease: Secondary | ICD-10-CM | POA: Diagnosis not present

## 2014-04-17 DIAGNOSIS — Z992 Dependence on renal dialysis: Secondary | ICD-10-CM | POA: Diagnosis not present

## 2014-04-18 DIAGNOSIS — N186 End stage renal disease: Secondary | ICD-10-CM | POA: Diagnosis not present

## 2014-04-18 DIAGNOSIS — N2581 Secondary hyperparathyroidism of renal origin: Secondary | ICD-10-CM | POA: Diagnosis not present

## 2014-04-18 DIAGNOSIS — D631 Anemia in chronic kidney disease: Secondary | ICD-10-CM | POA: Diagnosis not present

## 2014-04-20 DIAGNOSIS — N186 End stage renal disease: Secondary | ICD-10-CM | POA: Diagnosis not present

## 2014-04-20 DIAGNOSIS — D631 Anemia in chronic kidney disease: Secondary | ICD-10-CM | POA: Diagnosis not present

## 2014-04-20 DIAGNOSIS — N2581 Secondary hyperparathyroidism of renal origin: Secondary | ICD-10-CM | POA: Diagnosis not present

## 2014-04-22 DIAGNOSIS — N2581 Secondary hyperparathyroidism of renal origin: Secondary | ICD-10-CM | POA: Diagnosis not present

## 2014-04-22 DIAGNOSIS — D631 Anemia in chronic kidney disease: Secondary | ICD-10-CM | POA: Diagnosis not present

## 2014-04-22 DIAGNOSIS — N186 End stage renal disease: Secondary | ICD-10-CM | POA: Diagnosis not present

## 2014-04-25 DIAGNOSIS — N186 End stage renal disease: Secondary | ICD-10-CM | POA: Diagnosis not present

## 2014-04-25 DIAGNOSIS — N2581 Secondary hyperparathyroidism of renal origin: Secondary | ICD-10-CM | POA: Diagnosis not present

## 2014-04-25 DIAGNOSIS — D631 Anemia in chronic kidney disease: Secondary | ICD-10-CM | POA: Diagnosis not present

## 2014-04-27 DIAGNOSIS — N186 End stage renal disease: Secondary | ICD-10-CM | POA: Diagnosis not present

## 2014-04-27 DIAGNOSIS — D631 Anemia in chronic kidney disease: Secondary | ICD-10-CM | POA: Diagnosis not present

## 2014-04-27 DIAGNOSIS — N2581 Secondary hyperparathyroidism of renal origin: Secondary | ICD-10-CM | POA: Diagnosis not present

## 2014-04-28 ENCOUNTER — Other Ambulatory Visit: Payer: Self-pay | Admitting: Internal Medicine

## 2014-04-29 DIAGNOSIS — N186 End stage renal disease: Secondary | ICD-10-CM | POA: Diagnosis not present

## 2014-04-29 DIAGNOSIS — N2581 Secondary hyperparathyroidism of renal origin: Secondary | ICD-10-CM | POA: Diagnosis not present

## 2014-04-29 DIAGNOSIS — D631 Anemia in chronic kidney disease: Secondary | ICD-10-CM | POA: Diagnosis not present

## 2014-05-02 DIAGNOSIS — L299 Pruritus, unspecified: Secondary | ICD-10-CM | POA: Diagnosis not present

## 2014-05-02 DIAGNOSIS — D631 Anemia in chronic kidney disease: Secondary | ICD-10-CM | POA: Diagnosis not present

## 2014-05-02 DIAGNOSIS — E611 Iron deficiency: Secondary | ICD-10-CM | POA: Diagnosis not present

## 2014-05-02 DIAGNOSIS — D509 Iron deficiency anemia, unspecified: Secondary | ICD-10-CM | POA: Diagnosis not present

## 2014-05-02 DIAGNOSIS — E039 Hypothyroidism, unspecified: Secondary | ICD-10-CM | POA: Diagnosis not present

## 2014-05-02 DIAGNOSIS — N186 End stage renal disease: Secondary | ICD-10-CM | POA: Diagnosis not present

## 2014-05-04 DIAGNOSIS — D509 Iron deficiency anemia, unspecified: Secondary | ICD-10-CM | POA: Diagnosis not present

## 2014-05-04 DIAGNOSIS — D631 Anemia in chronic kidney disease: Secondary | ICD-10-CM | POA: Diagnosis not present

## 2014-05-04 DIAGNOSIS — N186 End stage renal disease: Secondary | ICD-10-CM | POA: Diagnosis not present

## 2014-05-04 DIAGNOSIS — L299 Pruritus, unspecified: Secondary | ICD-10-CM | POA: Diagnosis not present

## 2014-05-04 DIAGNOSIS — E039 Hypothyroidism, unspecified: Secondary | ICD-10-CM | POA: Diagnosis not present

## 2014-05-04 DIAGNOSIS — E611 Iron deficiency: Secondary | ICD-10-CM | POA: Diagnosis not present

## 2014-05-06 DIAGNOSIS — E611 Iron deficiency: Secondary | ICD-10-CM | POA: Diagnosis not present

## 2014-05-06 DIAGNOSIS — D631 Anemia in chronic kidney disease: Secondary | ICD-10-CM | POA: Diagnosis not present

## 2014-05-06 DIAGNOSIS — E039 Hypothyroidism, unspecified: Secondary | ICD-10-CM | POA: Diagnosis not present

## 2014-05-06 DIAGNOSIS — N186 End stage renal disease: Secondary | ICD-10-CM | POA: Diagnosis not present

## 2014-05-06 DIAGNOSIS — D509 Iron deficiency anemia, unspecified: Secondary | ICD-10-CM | POA: Diagnosis not present

## 2014-05-06 DIAGNOSIS — L299 Pruritus, unspecified: Secondary | ICD-10-CM | POA: Diagnosis not present

## 2014-05-09 DIAGNOSIS — N186 End stage renal disease: Secondary | ICD-10-CM | POA: Diagnosis not present

## 2014-05-09 DIAGNOSIS — L299 Pruritus, unspecified: Secondary | ICD-10-CM | POA: Diagnosis not present

## 2014-05-09 DIAGNOSIS — E611 Iron deficiency: Secondary | ICD-10-CM | POA: Diagnosis not present

## 2014-05-09 DIAGNOSIS — D631 Anemia in chronic kidney disease: Secondary | ICD-10-CM | POA: Diagnosis not present

## 2014-05-09 DIAGNOSIS — E039 Hypothyroidism, unspecified: Secondary | ICD-10-CM | POA: Diagnosis not present

## 2014-05-09 DIAGNOSIS — D509 Iron deficiency anemia, unspecified: Secondary | ICD-10-CM | POA: Diagnosis not present

## 2014-05-11 ENCOUNTER — Other Ambulatory Visit: Payer: Self-pay

## 2014-05-11 DIAGNOSIS — N186 End stage renal disease: Secondary | ICD-10-CM | POA: Diagnosis not present

## 2014-05-11 DIAGNOSIS — L299 Pruritus, unspecified: Secondary | ICD-10-CM | POA: Diagnosis not present

## 2014-05-11 DIAGNOSIS — D509 Iron deficiency anemia, unspecified: Secondary | ICD-10-CM | POA: Diagnosis not present

## 2014-05-11 DIAGNOSIS — D631 Anemia in chronic kidney disease: Secondary | ICD-10-CM | POA: Diagnosis not present

## 2014-05-11 DIAGNOSIS — E039 Hypothyroidism, unspecified: Secondary | ICD-10-CM | POA: Diagnosis not present

## 2014-05-11 DIAGNOSIS — E611 Iron deficiency: Secondary | ICD-10-CM | POA: Diagnosis not present

## 2014-05-11 MED ORDER — LEVOTHYROXINE SODIUM 75 MCG PO TABS: 75.0000 ug | ORAL_TABLET | Freq: Every day | ORAL | Status: DC

## 2014-05-13 DIAGNOSIS — L299 Pruritus, unspecified: Secondary | ICD-10-CM | POA: Diagnosis not present

## 2014-05-13 DIAGNOSIS — E039 Hypothyroidism, unspecified: Secondary | ICD-10-CM | POA: Diagnosis not present

## 2014-05-13 DIAGNOSIS — N186 End stage renal disease: Secondary | ICD-10-CM | POA: Diagnosis not present

## 2014-05-13 DIAGNOSIS — D509 Iron deficiency anemia, unspecified: Secondary | ICD-10-CM | POA: Diagnosis not present

## 2014-05-13 DIAGNOSIS — E611 Iron deficiency: Secondary | ICD-10-CM | POA: Diagnosis not present

## 2014-05-13 DIAGNOSIS — D631 Anemia in chronic kidney disease: Secondary | ICD-10-CM | POA: Diagnosis not present

## 2014-05-15 DIAGNOSIS — N186 End stage renal disease: Secondary | ICD-10-CM | POA: Diagnosis not present

## 2014-05-15 DIAGNOSIS — Z992 Dependence on renal dialysis: Secondary | ICD-10-CM | POA: Diagnosis not present

## 2014-05-16 DIAGNOSIS — N186 End stage renal disease: Secondary | ICD-10-CM | POA: Diagnosis not present

## 2014-05-16 DIAGNOSIS — L299 Pruritus, unspecified: Secondary | ICD-10-CM | POA: Diagnosis not present

## 2014-05-16 DIAGNOSIS — Z992 Dependence on renal dialysis: Secondary | ICD-10-CM | POA: Diagnosis not present

## 2014-05-16 DIAGNOSIS — D509 Iron deficiency anemia, unspecified: Secondary | ICD-10-CM | POA: Diagnosis not present

## 2014-05-16 DIAGNOSIS — E611 Iron deficiency: Secondary | ICD-10-CM | POA: Diagnosis not present

## 2014-05-16 DIAGNOSIS — D631 Anemia in chronic kidney disease: Secondary | ICD-10-CM | POA: Diagnosis not present

## 2014-05-16 DIAGNOSIS — E039 Hypothyroidism, unspecified: Secondary | ICD-10-CM | POA: Diagnosis not present

## 2014-05-17 ENCOUNTER — Ambulatory Visit: Payer: Medicare Other | Admitting: Internal Medicine

## 2014-05-18 DIAGNOSIS — Z992 Dependence on renal dialysis: Secondary | ICD-10-CM | POA: Diagnosis not present

## 2014-05-18 DIAGNOSIS — D631 Anemia in chronic kidney disease: Secondary | ICD-10-CM | POA: Diagnosis not present

## 2014-05-18 DIAGNOSIS — N186 End stage renal disease: Secondary | ICD-10-CM | POA: Diagnosis not present

## 2014-05-20 DIAGNOSIS — D631 Anemia in chronic kidney disease: Secondary | ICD-10-CM | POA: Diagnosis not present

## 2014-05-20 DIAGNOSIS — N186 End stage renal disease: Secondary | ICD-10-CM | POA: Diagnosis not present

## 2014-05-23 DIAGNOSIS — N186 End stage renal disease: Secondary | ICD-10-CM | POA: Diagnosis not present

## 2014-05-23 DIAGNOSIS — D631 Anemia in chronic kidney disease: Secondary | ICD-10-CM | POA: Diagnosis not present

## 2014-05-25 DIAGNOSIS — N186 End stage renal disease: Secondary | ICD-10-CM | POA: Diagnosis not present

## 2014-05-25 DIAGNOSIS — D631 Anemia in chronic kidney disease: Secondary | ICD-10-CM | POA: Diagnosis not present

## 2014-05-27 DIAGNOSIS — D631 Anemia in chronic kidney disease: Secondary | ICD-10-CM | POA: Diagnosis not present

## 2014-05-27 DIAGNOSIS — N186 End stage renal disease: Secondary | ICD-10-CM | POA: Diagnosis not present

## 2014-05-30 DIAGNOSIS — N2581 Secondary hyperparathyroidism of renal origin: Secondary | ICD-10-CM | POA: Diagnosis not present

## 2014-05-30 DIAGNOSIS — D631 Anemia in chronic kidney disease: Secondary | ICD-10-CM | POA: Diagnosis not present

## 2014-05-30 DIAGNOSIS — N186 End stage renal disease: Secondary | ICD-10-CM | POA: Diagnosis not present

## 2014-06-01 DIAGNOSIS — N186 End stage renal disease: Secondary | ICD-10-CM | POA: Diagnosis not present

## 2014-06-01 DIAGNOSIS — D631 Anemia in chronic kidney disease: Secondary | ICD-10-CM | POA: Diagnosis not present

## 2014-06-01 DIAGNOSIS — N2581 Secondary hyperparathyroidism of renal origin: Secondary | ICD-10-CM | POA: Diagnosis not present

## 2014-06-03 DIAGNOSIS — N2581 Secondary hyperparathyroidism of renal origin: Secondary | ICD-10-CM | POA: Diagnosis not present

## 2014-06-03 DIAGNOSIS — N186 End stage renal disease: Secondary | ICD-10-CM | POA: Diagnosis not present

## 2014-06-03 DIAGNOSIS — D631 Anemia in chronic kidney disease: Secondary | ICD-10-CM | POA: Diagnosis not present

## 2014-06-06 DIAGNOSIS — N186 End stage renal disease: Secondary | ICD-10-CM | POA: Diagnosis not present

## 2014-06-06 DIAGNOSIS — D631 Anemia in chronic kidney disease: Secondary | ICD-10-CM | POA: Diagnosis not present

## 2014-06-06 DIAGNOSIS — N2581 Secondary hyperparathyroidism of renal origin: Secondary | ICD-10-CM | POA: Diagnosis not present

## 2014-06-08 DIAGNOSIS — N2581 Secondary hyperparathyroidism of renal origin: Secondary | ICD-10-CM | POA: Diagnosis not present

## 2014-06-08 DIAGNOSIS — N186 End stage renal disease: Secondary | ICD-10-CM | POA: Diagnosis not present

## 2014-06-08 DIAGNOSIS — D631 Anemia in chronic kidney disease: Secondary | ICD-10-CM | POA: Diagnosis not present

## 2014-06-10 DIAGNOSIS — N2581 Secondary hyperparathyroidism of renal origin: Secondary | ICD-10-CM | POA: Diagnosis not present

## 2014-06-10 DIAGNOSIS — D631 Anemia in chronic kidney disease: Secondary | ICD-10-CM | POA: Diagnosis not present

## 2014-06-10 DIAGNOSIS — N186 End stage renal disease: Secondary | ICD-10-CM | POA: Diagnosis not present

## 2014-06-13 DIAGNOSIS — N186 End stage renal disease: Secondary | ICD-10-CM | POA: Diagnosis not present

## 2014-06-13 DIAGNOSIS — N2581 Secondary hyperparathyroidism of renal origin: Secondary | ICD-10-CM | POA: Diagnosis not present

## 2014-06-13 DIAGNOSIS — D631 Anemia in chronic kidney disease: Secondary | ICD-10-CM | POA: Diagnosis not present

## 2014-06-15 DIAGNOSIS — N186 End stage renal disease: Secondary | ICD-10-CM | POA: Diagnosis not present

## 2014-06-15 DIAGNOSIS — N2581 Secondary hyperparathyroidism of renal origin: Secondary | ICD-10-CM | POA: Diagnosis not present

## 2014-06-15 DIAGNOSIS — D631 Anemia in chronic kidney disease: Secondary | ICD-10-CM | POA: Diagnosis not present

## 2014-06-16 DIAGNOSIS — Z992 Dependence on renal dialysis: Secondary | ICD-10-CM | POA: Diagnosis not present

## 2014-06-16 DIAGNOSIS — N186 End stage renal disease: Secondary | ICD-10-CM | POA: Diagnosis not present

## 2014-06-16 DIAGNOSIS — E1129 Type 2 diabetes mellitus with other diabetic kidney complication: Secondary | ICD-10-CM | POA: Diagnosis not present

## 2014-06-17 DIAGNOSIS — D631 Anemia in chronic kidney disease: Secondary | ICD-10-CM | POA: Diagnosis not present

## 2014-06-17 DIAGNOSIS — N186 End stage renal disease: Secondary | ICD-10-CM | POA: Diagnosis not present

## 2014-06-17 DIAGNOSIS — N2581 Secondary hyperparathyroidism of renal origin: Secondary | ICD-10-CM | POA: Diagnosis not present

## 2014-06-20 DIAGNOSIS — N186 End stage renal disease: Secondary | ICD-10-CM | POA: Diagnosis not present

## 2014-06-20 DIAGNOSIS — N2581 Secondary hyperparathyroidism of renal origin: Secondary | ICD-10-CM | POA: Diagnosis not present

## 2014-06-20 DIAGNOSIS — D631 Anemia in chronic kidney disease: Secondary | ICD-10-CM | POA: Diagnosis not present

## 2014-06-22 DIAGNOSIS — N2581 Secondary hyperparathyroidism of renal origin: Secondary | ICD-10-CM | POA: Diagnosis not present

## 2014-06-22 DIAGNOSIS — D631 Anemia in chronic kidney disease: Secondary | ICD-10-CM | POA: Diagnosis not present

## 2014-06-22 DIAGNOSIS — N186 End stage renal disease: Secondary | ICD-10-CM | POA: Diagnosis not present

## 2014-06-24 DIAGNOSIS — D631 Anemia in chronic kidney disease: Secondary | ICD-10-CM | POA: Diagnosis not present

## 2014-06-24 DIAGNOSIS — N2581 Secondary hyperparathyroidism of renal origin: Secondary | ICD-10-CM | POA: Diagnosis not present

## 2014-06-24 DIAGNOSIS — N186 End stage renal disease: Secondary | ICD-10-CM | POA: Diagnosis not present

## 2014-06-27 DIAGNOSIS — N186 End stage renal disease: Secondary | ICD-10-CM | POA: Diagnosis not present

## 2014-06-27 DIAGNOSIS — N2581 Secondary hyperparathyroidism of renal origin: Secondary | ICD-10-CM | POA: Diagnosis not present

## 2014-06-27 DIAGNOSIS — D631 Anemia in chronic kidney disease: Secondary | ICD-10-CM | POA: Diagnosis not present

## 2014-06-29 DIAGNOSIS — N186 End stage renal disease: Secondary | ICD-10-CM | POA: Diagnosis not present

## 2014-06-29 DIAGNOSIS — N2581 Secondary hyperparathyroidism of renal origin: Secondary | ICD-10-CM | POA: Diagnosis not present

## 2014-06-29 DIAGNOSIS — D631 Anemia in chronic kidney disease: Secondary | ICD-10-CM | POA: Diagnosis not present

## 2014-07-01 DIAGNOSIS — D631 Anemia in chronic kidney disease: Secondary | ICD-10-CM | POA: Diagnosis not present

## 2014-07-01 DIAGNOSIS — N186 End stage renal disease: Secondary | ICD-10-CM | POA: Diagnosis not present

## 2014-07-01 DIAGNOSIS — N2581 Secondary hyperparathyroidism of renal origin: Secondary | ICD-10-CM | POA: Diagnosis not present

## 2014-07-04 DIAGNOSIS — N186 End stage renal disease: Secondary | ICD-10-CM | POA: Diagnosis not present

## 2014-07-04 DIAGNOSIS — D631 Anemia in chronic kidney disease: Secondary | ICD-10-CM | POA: Diagnosis not present

## 2014-07-04 DIAGNOSIS — N2581 Secondary hyperparathyroidism of renal origin: Secondary | ICD-10-CM | POA: Diagnosis not present

## 2014-07-06 DIAGNOSIS — N2581 Secondary hyperparathyroidism of renal origin: Secondary | ICD-10-CM | POA: Diagnosis not present

## 2014-07-06 DIAGNOSIS — D631 Anemia in chronic kidney disease: Secondary | ICD-10-CM | POA: Diagnosis not present

## 2014-07-06 DIAGNOSIS — N186 End stage renal disease: Secondary | ICD-10-CM | POA: Diagnosis not present

## 2014-07-07 DIAGNOSIS — E1129 Type 2 diabetes mellitus with other diabetic kidney complication: Secondary | ICD-10-CM | POA: Diagnosis not present

## 2014-07-07 DIAGNOSIS — Z992 Dependence on renal dialysis: Secondary | ICD-10-CM | POA: Diagnosis not present

## 2014-07-07 DIAGNOSIS — N186 End stage renal disease: Secondary | ICD-10-CM | POA: Diagnosis not present

## 2014-07-08 DIAGNOSIS — Z992 Dependence on renal dialysis: Secondary | ICD-10-CM | POA: Diagnosis not present

## 2014-07-08 DIAGNOSIS — Z794 Long term (current) use of insulin: Secondary | ICD-10-CM | POA: Diagnosis not present

## 2014-07-08 DIAGNOSIS — N186 End stage renal disease: Secondary | ICD-10-CM | POA: Diagnosis not present

## 2014-07-08 DIAGNOSIS — N2581 Secondary hyperparathyroidism of renal origin: Secondary | ICD-10-CM | POA: Diagnosis not present

## 2014-07-08 DIAGNOSIS — E119 Type 2 diabetes mellitus without complications: Secondary | ICD-10-CM | POA: Diagnosis not present

## 2014-07-11 DIAGNOSIS — Z992 Dependence on renal dialysis: Secondary | ICD-10-CM | POA: Diagnosis not present

## 2014-07-11 DIAGNOSIS — N2581 Secondary hyperparathyroidism of renal origin: Secondary | ICD-10-CM | POA: Diagnosis not present

## 2014-07-11 DIAGNOSIS — N186 End stage renal disease: Secondary | ICD-10-CM | POA: Diagnosis not present

## 2014-07-13 DIAGNOSIS — Z992 Dependence on renal dialysis: Secondary | ICD-10-CM | POA: Diagnosis not present

## 2014-07-13 DIAGNOSIS — N2581 Secondary hyperparathyroidism of renal origin: Secondary | ICD-10-CM | POA: Diagnosis not present

## 2014-07-13 DIAGNOSIS — N186 End stage renal disease: Secondary | ICD-10-CM | POA: Diagnosis not present

## 2014-07-15 DIAGNOSIS — Z992 Dependence on renal dialysis: Secondary | ICD-10-CM | POA: Diagnosis not present

## 2014-07-15 DIAGNOSIS — N186 End stage renal disease: Secondary | ICD-10-CM | POA: Diagnosis not present

## 2014-07-15 DIAGNOSIS — N2581 Secondary hyperparathyroidism of renal origin: Secondary | ICD-10-CM | POA: Diagnosis not present

## 2014-07-18 DIAGNOSIS — D509 Iron deficiency anemia, unspecified: Secondary | ICD-10-CM | POA: Diagnosis not present

## 2014-07-18 DIAGNOSIS — Z992 Dependence on renal dialysis: Secondary | ICD-10-CM | POA: Diagnosis not present

## 2014-07-18 DIAGNOSIS — N186 End stage renal disease: Secondary | ICD-10-CM | POA: Diagnosis not present

## 2014-07-18 DIAGNOSIS — N2581 Secondary hyperparathyroidism of renal origin: Secondary | ICD-10-CM | POA: Diagnosis not present

## 2014-07-20 DIAGNOSIS — D509 Iron deficiency anemia, unspecified: Secondary | ICD-10-CM | POA: Diagnosis not present

## 2014-07-20 DIAGNOSIS — N2581 Secondary hyperparathyroidism of renal origin: Secondary | ICD-10-CM | POA: Diagnosis not present

## 2014-07-20 DIAGNOSIS — Z992 Dependence on renal dialysis: Secondary | ICD-10-CM | POA: Diagnosis not present

## 2014-07-20 DIAGNOSIS — N186 End stage renal disease: Secondary | ICD-10-CM | POA: Diagnosis not present

## 2014-07-22 DIAGNOSIS — D509 Iron deficiency anemia, unspecified: Secondary | ICD-10-CM | POA: Diagnosis not present

## 2014-07-22 DIAGNOSIS — N186 End stage renal disease: Secondary | ICD-10-CM | POA: Diagnosis not present

## 2014-07-22 DIAGNOSIS — Z992 Dependence on renal dialysis: Secondary | ICD-10-CM | POA: Diagnosis not present

## 2014-07-22 DIAGNOSIS — N2581 Secondary hyperparathyroidism of renal origin: Secondary | ICD-10-CM | POA: Diagnosis not present

## 2014-07-25 DIAGNOSIS — N2581 Secondary hyperparathyroidism of renal origin: Secondary | ICD-10-CM | POA: Diagnosis not present

## 2014-07-25 DIAGNOSIS — Z992 Dependence on renal dialysis: Secondary | ICD-10-CM | POA: Diagnosis not present

## 2014-07-25 DIAGNOSIS — D509 Iron deficiency anemia, unspecified: Secondary | ICD-10-CM | POA: Diagnosis not present

## 2014-07-25 DIAGNOSIS — N186 End stage renal disease: Secondary | ICD-10-CM | POA: Diagnosis not present

## 2014-07-27 DIAGNOSIS — D509 Iron deficiency anemia, unspecified: Secondary | ICD-10-CM | POA: Diagnosis not present

## 2014-07-27 DIAGNOSIS — N186 End stage renal disease: Secondary | ICD-10-CM | POA: Diagnosis not present

## 2014-07-27 DIAGNOSIS — Z992 Dependence on renal dialysis: Secondary | ICD-10-CM | POA: Diagnosis not present

## 2014-07-27 DIAGNOSIS — N2581 Secondary hyperparathyroidism of renal origin: Secondary | ICD-10-CM | POA: Diagnosis not present

## 2014-07-29 DIAGNOSIS — N2581 Secondary hyperparathyroidism of renal origin: Secondary | ICD-10-CM | POA: Diagnosis not present

## 2014-07-29 DIAGNOSIS — D509 Iron deficiency anemia, unspecified: Secondary | ICD-10-CM | POA: Diagnosis not present

## 2014-07-29 DIAGNOSIS — Z992 Dependence on renal dialysis: Secondary | ICD-10-CM | POA: Diagnosis not present

## 2014-07-29 DIAGNOSIS — N186 End stage renal disease: Secondary | ICD-10-CM | POA: Diagnosis not present

## 2014-08-01 DIAGNOSIS — N186 End stage renal disease: Secondary | ICD-10-CM | POA: Diagnosis not present

## 2014-08-01 DIAGNOSIS — E119 Type 2 diabetes mellitus without complications: Secondary | ICD-10-CM | POA: Diagnosis not present

## 2014-08-01 DIAGNOSIS — Z794 Long term (current) use of insulin: Secondary | ICD-10-CM | POA: Diagnosis not present

## 2014-08-01 DIAGNOSIS — Z992 Dependence on renal dialysis: Secondary | ICD-10-CM | POA: Diagnosis not present

## 2014-08-01 DIAGNOSIS — N2581 Secondary hyperparathyroidism of renal origin: Secondary | ICD-10-CM | POA: Diagnosis not present

## 2014-08-01 DIAGNOSIS — D509 Iron deficiency anemia, unspecified: Secondary | ICD-10-CM | POA: Diagnosis not present

## 2014-08-03 DIAGNOSIS — D509 Iron deficiency anemia, unspecified: Secondary | ICD-10-CM | POA: Diagnosis not present

## 2014-08-03 DIAGNOSIS — N2581 Secondary hyperparathyroidism of renal origin: Secondary | ICD-10-CM | POA: Diagnosis not present

## 2014-08-03 DIAGNOSIS — N186 End stage renal disease: Secondary | ICD-10-CM | POA: Diagnosis not present

## 2014-08-03 DIAGNOSIS — Z992 Dependence on renal dialysis: Secondary | ICD-10-CM | POA: Diagnosis not present

## 2014-08-04 DIAGNOSIS — K529 Noninfective gastroenteritis and colitis, unspecified: Secondary | ICD-10-CM | POA: Diagnosis not present

## 2014-08-04 DIAGNOSIS — Z01818 Encounter for other preprocedural examination: Secondary | ICD-10-CM | POA: Diagnosis not present

## 2014-08-05 DIAGNOSIS — N2581 Secondary hyperparathyroidism of renal origin: Secondary | ICD-10-CM | POA: Diagnosis not present

## 2014-08-05 DIAGNOSIS — N186 End stage renal disease: Secondary | ICD-10-CM | POA: Diagnosis not present

## 2014-08-05 DIAGNOSIS — Z992 Dependence on renal dialysis: Secondary | ICD-10-CM | POA: Diagnosis not present

## 2014-08-05 DIAGNOSIS — D509 Iron deficiency anemia, unspecified: Secondary | ICD-10-CM | POA: Diagnosis not present

## 2014-08-08 DIAGNOSIS — D509 Iron deficiency anemia, unspecified: Secondary | ICD-10-CM | POA: Diagnosis not present

## 2014-08-08 DIAGNOSIS — Z992 Dependence on renal dialysis: Secondary | ICD-10-CM | POA: Diagnosis not present

## 2014-08-08 DIAGNOSIS — N186 End stage renal disease: Secondary | ICD-10-CM | POA: Diagnosis not present

## 2014-08-08 DIAGNOSIS — N2581 Secondary hyperparathyroidism of renal origin: Secondary | ICD-10-CM | POA: Diagnosis not present

## 2014-08-10 DIAGNOSIS — N186 End stage renal disease: Secondary | ICD-10-CM | POA: Diagnosis not present

## 2014-08-10 DIAGNOSIS — N2581 Secondary hyperparathyroidism of renal origin: Secondary | ICD-10-CM | POA: Diagnosis not present

## 2014-08-10 DIAGNOSIS — D509 Iron deficiency anemia, unspecified: Secondary | ICD-10-CM | POA: Diagnosis not present

## 2014-08-10 DIAGNOSIS — Z992 Dependence on renal dialysis: Secondary | ICD-10-CM | POA: Diagnosis not present

## 2014-08-12 DIAGNOSIS — N2581 Secondary hyperparathyroidism of renal origin: Secondary | ICD-10-CM | POA: Diagnosis not present

## 2014-08-12 DIAGNOSIS — N186 End stage renal disease: Secondary | ICD-10-CM | POA: Diagnosis not present

## 2014-08-12 DIAGNOSIS — Z992 Dependence on renal dialysis: Secondary | ICD-10-CM | POA: Diagnosis not present

## 2014-08-12 DIAGNOSIS — D509 Iron deficiency anemia, unspecified: Secondary | ICD-10-CM | POA: Diagnosis not present

## 2014-08-15 DIAGNOSIS — D509 Iron deficiency anemia, unspecified: Secondary | ICD-10-CM | POA: Diagnosis not present

## 2014-08-15 DIAGNOSIS — N186 End stage renal disease: Secondary | ICD-10-CM | POA: Diagnosis not present

## 2014-08-15 DIAGNOSIS — N2581 Secondary hyperparathyroidism of renal origin: Secondary | ICD-10-CM | POA: Diagnosis not present

## 2014-08-15 DIAGNOSIS — Z992 Dependence on renal dialysis: Secondary | ICD-10-CM | POA: Diagnosis not present

## 2014-08-16 DIAGNOSIS — Z4931 Encounter for adequacy testing for hemodialysis: Secondary | ICD-10-CM | POA: Diagnosis not present

## 2014-08-16 DIAGNOSIS — T82858A Stenosis of vascular prosthetic devices, implants and grafts, initial encounter: Secondary | ICD-10-CM | POA: Diagnosis not present

## 2014-08-16 DIAGNOSIS — Z992 Dependence on renal dialysis: Secondary | ICD-10-CM | POA: Diagnosis not present

## 2014-08-16 DIAGNOSIS — I871 Compression of vein: Secondary | ICD-10-CM | POA: Diagnosis not present

## 2014-08-16 DIAGNOSIS — N186 End stage renal disease: Secondary | ICD-10-CM | POA: Diagnosis not present

## 2014-08-17 DIAGNOSIS — N2581 Secondary hyperparathyroidism of renal origin: Secondary | ICD-10-CM | POA: Diagnosis not present

## 2014-08-17 DIAGNOSIS — Z992 Dependence on renal dialysis: Secondary | ICD-10-CM | POA: Diagnosis not present

## 2014-08-17 DIAGNOSIS — D509 Iron deficiency anemia, unspecified: Secondary | ICD-10-CM | POA: Diagnosis not present

## 2014-08-17 DIAGNOSIS — D631 Anemia in chronic kidney disease: Secondary | ICD-10-CM | POA: Diagnosis not present

## 2014-08-17 DIAGNOSIS — N186 End stage renal disease: Secondary | ICD-10-CM | POA: Diagnosis not present

## 2014-08-19 DIAGNOSIS — D509 Iron deficiency anemia, unspecified: Secondary | ICD-10-CM | POA: Diagnosis not present

## 2014-08-19 DIAGNOSIS — N2581 Secondary hyperparathyroidism of renal origin: Secondary | ICD-10-CM | POA: Diagnosis not present

## 2014-08-19 DIAGNOSIS — D631 Anemia in chronic kidney disease: Secondary | ICD-10-CM | POA: Diagnosis not present

## 2014-08-19 DIAGNOSIS — N186 End stage renal disease: Secondary | ICD-10-CM | POA: Diagnosis not present

## 2014-08-19 DIAGNOSIS — Z992 Dependence on renal dialysis: Secondary | ICD-10-CM | POA: Diagnosis not present

## 2014-08-22 DIAGNOSIS — N186 End stage renal disease: Secondary | ICD-10-CM | POA: Diagnosis not present

## 2014-08-22 DIAGNOSIS — D509 Iron deficiency anemia, unspecified: Secondary | ICD-10-CM | POA: Diagnosis not present

## 2014-08-22 DIAGNOSIS — N2581 Secondary hyperparathyroidism of renal origin: Secondary | ICD-10-CM | POA: Diagnosis not present

## 2014-08-22 DIAGNOSIS — D631 Anemia in chronic kidney disease: Secondary | ICD-10-CM | POA: Diagnosis not present

## 2014-08-22 DIAGNOSIS — Z992 Dependence on renal dialysis: Secondary | ICD-10-CM | POA: Diagnosis not present

## 2014-08-24 DIAGNOSIS — N2581 Secondary hyperparathyroidism of renal origin: Secondary | ICD-10-CM | POA: Diagnosis not present

## 2014-08-24 DIAGNOSIS — D509 Iron deficiency anemia, unspecified: Secondary | ICD-10-CM | POA: Diagnosis not present

## 2014-08-24 DIAGNOSIS — D631 Anemia in chronic kidney disease: Secondary | ICD-10-CM | POA: Diagnosis not present

## 2014-08-24 DIAGNOSIS — N186 End stage renal disease: Secondary | ICD-10-CM | POA: Diagnosis not present

## 2014-08-24 DIAGNOSIS — Z992 Dependence on renal dialysis: Secondary | ICD-10-CM | POA: Diagnosis not present

## 2014-08-26 DIAGNOSIS — Z992 Dependence on renal dialysis: Secondary | ICD-10-CM | POA: Diagnosis not present

## 2014-08-26 DIAGNOSIS — N186 End stage renal disease: Secondary | ICD-10-CM | POA: Diagnosis not present

## 2014-08-26 DIAGNOSIS — N2581 Secondary hyperparathyroidism of renal origin: Secondary | ICD-10-CM | POA: Diagnosis not present

## 2014-08-26 DIAGNOSIS — D631 Anemia in chronic kidney disease: Secondary | ICD-10-CM | POA: Diagnosis not present

## 2014-08-26 DIAGNOSIS — D509 Iron deficiency anemia, unspecified: Secondary | ICD-10-CM | POA: Diagnosis not present

## 2014-08-29 DIAGNOSIS — N186 End stage renal disease: Secondary | ICD-10-CM | POA: Diagnosis not present

## 2014-08-29 DIAGNOSIS — D509 Iron deficiency anemia, unspecified: Secondary | ICD-10-CM | POA: Diagnosis not present

## 2014-08-29 DIAGNOSIS — K529 Noninfective gastroenteritis and colitis, unspecified: Secondary | ICD-10-CM | POA: Diagnosis not present

## 2014-08-29 DIAGNOSIS — D631 Anemia in chronic kidney disease: Secondary | ICD-10-CM | POA: Diagnosis not present

## 2014-08-29 DIAGNOSIS — Z992 Dependence on renal dialysis: Secondary | ICD-10-CM | POA: Diagnosis not present

## 2014-08-29 DIAGNOSIS — N2581 Secondary hyperparathyroidism of renal origin: Secondary | ICD-10-CM | POA: Diagnosis not present

## 2014-08-31 DIAGNOSIS — D509 Iron deficiency anemia, unspecified: Secondary | ICD-10-CM | POA: Diagnosis not present

## 2014-08-31 DIAGNOSIS — D631 Anemia in chronic kidney disease: Secondary | ICD-10-CM | POA: Diagnosis not present

## 2014-08-31 DIAGNOSIS — N2581 Secondary hyperparathyroidism of renal origin: Secondary | ICD-10-CM | POA: Diagnosis not present

## 2014-08-31 DIAGNOSIS — Z992 Dependence on renal dialysis: Secondary | ICD-10-CM | POA: Diagnosis not present

## 2014-08-31 DIAGNOSIS — N186 End stage renal disease: Secondary | ICD-10-CM | POA: Diagnosis not present

## 2014-09-02 DIAGNOSIS — N2581 Secondary hyperparathyroidism of renal origin: Secondary | ICD-10-CM | POA: Diagnosis not present

## 2014-09-02 DIAGNOSIS — N186 End stage renal disease: Secondary | ICD-10-CM | POA: Diagnosis not present

## 2014-09-02 DIAGNOSIS — D509 Iron deficiency anemia, unspecified: Secondary | ICD-10-CM | POA: Diagnosis not present

## 2014-09-02 DIAGNOSIS — Z992 Dependence on renal dialysis: Secondary | ICD-10-CM | POA: Diagnosis not present

## 2014-09-02 DIAGNOSIS — D631 Anemia in chronic kidney disease: Secondary | ICD-10-CM | POA: Diagnosis not present

## 2014-09-05 DIAGNOSIS — D631 Anemia in chronic kidney disease: Secondary | ICD-10-CM | POA: Diagnosis not present

## 2014-09-05 DIAGNOSIS — N2581 Secondary hyperparathyroidism of renal origin: Secondary | ICD-10-CM | POA: Diagnosis not present

## 2014-09-05 DIAGNOSIS — D509 Iron deficiency anemia, unspecified: Secondary | ICD-10-CM | POA: Diagnosis not present

## 2014-09-05 DIAGNOSIS — N186 End stage renal disease: Secondary | ICD-10-CM | POA: Diagnosis not present

## 2014-09-05 DIAGNOSIS — Z992 Dependence on renal dialysis: Secondary | ICD-10-CM | POA: Diagnosis not present

## 2014-09-07 DIAGNOSIS — N186 End stage renal disease: Secondary | ICD-10-CM | POA: Diagnosis not present

## 2014-09-07 DIAGNOSIS — D509 Iron deficiency anemia, unspecified: Secondary | ICD-10-CM | POA: Diagnosis not present

## 2014-09-07 DIAGNOSIS — D631 Anemia in chronic kidney disease: Secondary | ICD-10-CM | POA: Diagnosis not present

## 2014-09-07 DIAGNOSIS — N2581 Secondary hyperparathyroidism of renal origin: Secondary | ICD-10-CM | POA: Diagnosis not present

## 2014-09-07 DIAGNOSIS — Z992 Dependence on renal dialysis: Secondary | ICD-10-CM | POA: Diagnosis not present

## 2014-09-09 DIAGNOSIS — N186 End stage renal disease: Secondary | ICD-10-CM | POA: Diagnosis not present

## 2014-09-09 DIAGNOSIS — Z992 Dependence on renal dialysis: Secondary | ICD-10-CM | POA: Diagnosis not present

## 2014-09-09 DIAGNOSIS — D631 Anemia in chronic kidney disease: Secondary | ICD-10-CM | POA: Diagnosis not present

## 2014-09-09 DIAGNOSIS — D509 Iron deficiency anemia, unspecified: Secondary | ICD-10-CM | POA: Diagnosis not present

## 2014-09-09 DIAGNOSIS — N2581 Secondary hyperparathyroidism of renal origin: Secondary | ICD-10-CM | POA: Diagnosis not present

## 2014-09-12 DIAGNOSIS — N186 End stage renal disease: Secondary | ICD-10-CM | POA: Diagnosis not present

## 2014-09-12 DIAGNOSIS — Z992 Dependence on renal dialysis: Secondary | ICD-10-CM | POA: Diagnosis not present

## 2014-09-12 DIAGNOSIS — D509 Iron deficiency anemia, unspecified: Secondary | ICD-10-CM | POA: Diagnosis not present

## 2014-09-12 DIAGNOSIS — N2581 Secondary hyperparathyroidism of renal origin: Secondary | ICD-10-CM | POA: Diagnosis not present

## 2014-09-12 DIAGNOSIS — D631 Anemia in chronic kidney disease: Secondary | ICD-10-CM | POA: Diagnosis not present

## 2014-09-13 DIAGNOSIS — M79604 Pain in right leg: Secondary | ICD-10-CM | POA: Diagnosis not present

## 2014-09-13 DIAGNOSIS — R5383 Other fatigue: Secondary | ICD-10-CM | POA: Diagnosis not present

## 2014-09-13 DIAGNOSIS — Z7901 Long term (current) use of anticoagulants: Secondary | ICD-10-CM | POA: Diagnosis not present

## 2014-09-14 DIAGNOSIS — D509 Iron deficiency anemia, unspecified: Secondary | ICD-10-CM | POA: Diagnosis not present

## 2014-09-14 DIAGNOSIS — Z992 Dependence on renal dialysis: Secondary | ICD-10-CM | POA: Diagnosis not present

## 2014-09-14 DIAGNOSIS — D631 Anemia in chronic kidney disease: Secondary | ICD-10-CM | POA: Diagnosis not present

## 2014-09-14 DIAGNOSIS — N2581 Secondary hyperparathyroidism of renal origin: Secondary | ICD-10-CM | POA: Diagnosis not present

## 2014-09-14 DIAGNOSIS — N186 End stage renal disease: Secondary | ICD-10-CM | POA: Diagnosis not present

## 2014-09-15 DIAGNOSIS — Z4931 Encounter for adequacy testing for hemodialysis: Secondary | ICD-10-CM | POA: Diagnosis not present

## 2014-09-15 DIAGNOSIS — N186 End stage renal disease: Secondary | ICD-10-CM | POA: Diagnosis not present

## 2014-09-15 DIAGNOSIS — Z992 Dependence on renal dialysis: Secondary | ICD-10-CM | POA: Diagnosis not present

## 2014-09-16 DIAGNOSIS — E10649 Type 1 diabetes mellitus with hypoglycemia without coma: Secondary | ICD-10-CM | POA: Diagnosis not present

## 2014-09-16 DIAGNOSIS — D509 Iron deficiency anemia, unspecified: Secondary | ICD-10-CM | POA: Diagnosis not present

## 2014-09-16 DIAGNOSIS — N2581 Secondary hyperparathyroidism of renal origin: Secondary | ICD-10-CM | POA: Diagnosis not present

## 2014-09-16 DIAGNOSIS — Z794 Long term (current) use of insulin: Secondary | ICD-10-CM | POA: Diagnosis not present

## 2014-09-16 DIAGNOSIS — N186 End stage renal disease: Secondary | ICD-10-CM | POA: Diagnosis not present

## 2014-09-16 DIAGNOSIS — E162 Hypoglycemia, unspecified: Secondary | ICD-10-CM | POA: Diagnosis not present

## 2014-09-16 DIAGNOSIS — Z992 Dependence on renal dialysis: Secondary | ICD-10-CM | POA: Diagnosis not present

## 2014-09-19 DIAGNOSIS — Z992 Dependence on renal dialysis: Secondary | ICD-10-CM | POA: Diagnosis not present

## 2014-09-19 DIAGNOSIS — N186 End stage renal disease: Secondary | ICD-10-CM | POA: Diagnosis not present

## 2014-09-19 DIAGNOSIS — D509 Iron deficiency anemia, unspecified: Secondary | ICD-10-CM | POA: Diagnosis not present

## 2014-09-19 DIAGNOSIS — N2581 Secondary hyperparathyroidism of renal origin: Secondary | ICD-10-CM | POA: Diagnosis not present

## 2014-09-19 DIAGNOSIS — E10649 Type 1 diabetes mellitus with hypoglycemia without coma: Secondary | ICD-10-CM | POA: Diagnosis not present

## 2014-09-19 DIAGNOSIS — E162 Hypoglycemia, unspecified: Secondary | ICD-10-CM | POA: Diagnosis not present

## 2014-09-20 DIAGNOSIS — T82898A Other specified complication of vascular prosthetic devices, implants and grafts, initial encounter: Secondary | ICD-10-CM | POA: Diagnosis not present

## 2014-09-21 DIAGNOSIS — E162 Hypoglycemia, unspecified: Secondary | ICD-10-CM | POA: Diagnosis not present

## 2014-09-21 DIAGNOSIS — N2581 Secondary hyperparathyroidism of renal origin: Secondary | ICD-10-CM | POA: Diagnosis not present

## 2014-09-21 DIAGNOSIS — Z992 Dependence on renal dialysis: Secondary | ICD-10-CM | POA: Diagnosis not present

## 2014-09-21 DIAGNOSIS — D509 Iron deficiency anemia, unspecified: Secondary | ICD-10-CM | POA: Diagnosis not present

## 2014-09-21 DIAGNOSIS — N186 End stage renal disease: Secondary | ICD-10-CM | POA: Diagnosis not present

## 2014-09-21 DIAGNOSIS — E10649 Type 1 diabetes mellitus with hypoglycemia without coma: Secondary | ICD-10-CM | POA: Diagnosis not present

## 2014-09-23 DIAGNOSIS — Z992 Dependence on renal dialysis: Secondary | ICD-10-CM | POA: Diagnosis not present

## 2014-09-23 DIAGNOSIS — N186 End stage renal disease: Secondary | ICD-10-CM | POA: Diagnosis not present

## 2014-09-23 DIAGNOSIS — D509 Iron deficiency anemia, unspecified: Secondary | ICD-10-CM | POA: Diagnosis not present

## 2014-09-23 DIAGNOSIS — E10649 Type 1 diabetes mellitus with hypoglycemia without coma: Secondary | ICD-10-CM | POA: Diagnosis not present

## 2014-09-23 DIAGNOSIS — N2581 Secondary hyperparathyroidism of renal origin: Secondary | ICD-10-CM | POA: Diagnosis not present

## 2014-09-23 DIAGNOSIS — E162 Hypoglycemia, unspecified: Secondary | ICD-10-CM | POA: Diagnosis not present

## 2014-09-26 DIAGNOSIS — N2581 Secondary hyperparathyroidism of renal origin: Secondary | ICD-10-CM | POA: Diagnosis not present

## 2014-09-26 DIAGNOSIS — D509 Iron deficiency anemia, unspecified: Secondary | ICD-10-CM | POA: Diagnosis not present

## 2014-09-26 DIAGNOSIS — Z992 Dependence on renal dialysis: Secondary | ICD-10-CM | POA: Diagnosis not present

## 2014-09-26 DIAGNOSIS — E162 Hypoglycemia, unspecified: Secondary | ICD-10-CM | POA: Diagnosis not present

## 2014-09-26 DIAGNOSIS — E10649 Type 1 diabetes mellitus with hypoglycemia without coma: Secondary | ICD-10-CM | POA: Diagnosis not present

## 2014-09-26 DIAGNOSIS — N186 End stage renal disease: Secondary | ICD-10-CM | POA: Diagnosis not present

## 2014-09-28 DIAGNOSIS — E162 Hypoglycemia, unspecified: Secondary | ICD-10-CM | POA: Diagnosis not present

## 2014-09-28 DIAGNOSIS — E10649 Type 1 diabetes mellitus with hypoglycemia without coma: Secondary | ICD-10-CM | POA: Diagnosis not present

## 2014-09-28 DIAGNOSIS — Z992 Dependence on renal dialysis: Secondary | ICD-10-CM | POA: Diagnosis not present

## 2014-09-28 DIAGNOSIS — N186 End stage renal disease: Secondary | ICD-10-CM | POA: Diagnosis not present

## 2014-09-28 DIAGNOSIS — N2581 Secondary hyperparathyroidism of renal origin: Secondary | ICD-10-CM | POA: Diagnosis not present

## 2014-09-28 DIAGNOSIS — D509 Iron deficiency anemia, unspecified: Secondary | ICD-10-CM | POA: Diagnosis not present

## 2014-09-29 DIAGNOSIS — R739 Hyperglycemia, unspecified: Secondary | ICD-10-CM | POA: Diagnosis not present

## 2014-09-30 DIAGNOSIS — N2581 Secondary hyperparathyroidism of renal origin: Secondary | ICD-10-CM | POA: Diagnosis not present

## 2014-09-30 DIAGNOSIS — Z992 Dependence on renal dialysis: Secondary | ICD-10-CM | POA: Diagnosis not present

## 2014-09-30 DIAGNOSIS — D509 Iron deficiency anemia, unspecified: Secondary | ICD-10-CM | POA: Diagnosis not present

## 2014-09-30 DIAGNOSIS — E162 Hypoglycemia, unspecified: Secondary | ICD-10-CM | POA: Diagnosis not present

## 2014-09-30 DIAGNOSIS — N186 End stage renal disease: Secondary | ICD-10-CM | POA: Diagnosis not present

## 2014-09-30 DIAGNOSIS — E10649 Type 1 diabetes mellitus with hypoglycemia without coma: Secondary | ICD-10-CM | POA: Diagnosis not present

## 2014-10-03 DIAGNOSIS — D509 Iron deficiency anemia, unspecified: Secondary | ICD-10-CM | POA: Diagnosis not present

## 2014-10-03 DIAGNOSIS — N2581 Secondary hyperparathyroidism of renal origin: Secondary | ICD-10-CM | POA: Diagnosis not present

## 2014-10-03 DIAGNOSIS — Z992 Dependence on renal dialysis: Secondary | ICD-10-CM | POA: Diagnosis not present

## 2014-10-03 DIAGNOSIS — E162 Hypoglycemia, unspecified: Secondary | ICD-10-CM | POA: Diagnosis not present

## 2014-10-03 DIAGNOSIS — E10649 Type 1 diabetes mellitus with hypoglycemia without coma: Secondary | ICD-10-CM | POA: Diagnosis not present

## 2014-10-03 DIAGNOSIS — N186 End stage renal disease: Secondary | ICD-10-CM | POA: Diagnosis not present

## 2014-10-04 DIAGNOSIS — I480 Paroxysmal atrial fibrillation: Secondary | ICD-10-CM | POA: Diagnosis not present

## 2014-10-04 DIAGNOSIS — K6389 Other specified diseases of intestine: Secondary | ICD-10-CM | POA: Diagnosis not present

## 2014-10-04 DIAGNOSIS — I120 Hypertensive chronic kidney disease with stage 5 chronic kidney disease or end stage renal disease: Secondary | ICD-10-CM | POA: Diagnosis not present

## 2014-10-04 DIAGNOSIS — E1122 Type 2 diabetes mellitus with diabetic chronic kidney disease: Secondary | ICD-10-CM | POA: Diagnosis not present

## 2014-10-04 DIAGNOSIS — J449 Chronic obstructive pulmonary disease, unspecified: Secondary | ICD-10-CM | POA: Diagnosis not present

## 2014-10-04 DIAGNOSIS — E039 Hypothyroidism, unspecified: Secondary | ICD-10-CM | POA: Diagnosis not present

## 2014-10-04 DIAGNOSIS — I509 Heart failure, unspecified: Secondary | ICD-10-CM | POA: Diagnosis not present

## 2014-10-04 DIAGNOSIS — R197 Diarrhea, unspecified: Secondary | ICD-10-CM | POA: Diagnosis not present

## 2014-10-04 DIAGNOSIS — K635 Polyp of colon: Secondary | ICD-10-CM | POA: Diagnosis not present

## 2014-10-04 DIAGNOSIS — Z7982 Long term (current) use of aspirin: Secondary | ICD-10-CM | POA: Diagnosis not present

## 2014-10-04 DIAGNOSIS — Z885 Allergy status to narcotic agent status: Secondary | ICD-10-CM | POA: Diagnosis not present

## 2014-10-04 DIAGNOSIS — K5730 Diverticulosis of large intestine without perforation or abscess without bleeding: Secondary | ICD-10-CM | POA: Diagnosis not present

## 2014-10-04 DIAGNOSIS — N186 End stage renal disease: Secondary | ICD-10-CM | POA: Diagnosis not present

## 2014-10-04 DIAGNOSIS — Z992 Dependence on renal dialysis: Secondary | ICD-10-CM | POA: Diagnosis not present

## 2014-10-04 DIAGNOSIS — D123 Benign neoplasm of transverse colon: Secondary | ICD-10-CM | POA: Diagnosis not present

## 2014-10-04 DIAGNOSIS — Z86010 Personal history of colonic polyps: Secondary | ICD-10-CM | POA: Diagnosis not present

## 2014-10-04 DIAGNOSIS — Z1211 Encounter for screening for malignant neoplasm of colon: Secondary | ICD-10-CM | POA: Diagnosis not present

## 2014-10-04 DIAGNOSIS — Z794 Long term (current) use of insulin: Secondary | ICD-10-CM | POA: Diagnosis not present

## 2014-10-04 DIAGNOSIS — Z09 Encounter for follow-up examination after completed treatment for conditions other than malignant neoplasm: Secondary | ICD-10-CM | POA: Diagnosis not present

## 2014-10-04 DIAGNOSIS — Z853 Personal history of malignant neoplasm of breast: Secondary | ICD-10-CM | POA: Diagnosis not present

## 2014-10-04 DIAGNOSIS — R194 Change in bowel habit: Secondary | ICD-10-CM | POA: Diagnosis not present

## 2014-10-04 DIAGNOSIS — Z79899 Other long term (current) drug therapy: Secondary | ICD-10-CM | POA: Diagnosis not present

## 2014-10-05 DIAGNOSIS — N186 End stage renal disease: Secondary | ICD-10-CM | POA: Diagnosis not present

## 2014-10-05 DIAGNOSIS — E162 Hypoglycemia, unspecified: Secondary | ICD-10-CM | POA: Diagnosis not present

## 2014-10-05 DIAGNOSIS — N2581 Secondary hyperparathyroidism of renal origin: Secondary | ICD-10-CM | POA: Diagnosis not present

## 2014-10-05 DIAGNOSIS — Z992 Dependence on renal dialysis: Secondary | ICD-10-CM | POA: Diagnosis not present

## 2014-10-05 DIAGNOSIS — D123 Benign neoplasm of transverse colon: Secondary | ICD-10-CM | POA: Diagnosis not present

## 2014-10-05 DIAGNOSIS — E10649 Type 1 diabetes mellitus with hypoglycemia without coma: Secondary | ICD-10-CM | POA: Diagnosis not present

## 2014-10-05 DIAGNOSIS — D509 Iron deficiency anemia, unspecified: Secondary | ICD-10-CM | POA: Diagnosis not present

## 2014-10-07 DIAGNOSIS — Z992 Dependence on renal dialysis: Secondary | ICD-10-CM | POA: Diagnosis not present

## 2014-10-07 DIAGNOSIS — E10649 Type 1 diabetes mellitus with hypoglycemia without coma: Secondary | ICD-10-CM | POA: Diagnosis not present

## 2014-10-07 DIAGNOSIS — N186 End stage renal disease: Secondary | ICD-10-CM | POA: Diagnosis not present

## 2014-10-07 DIAGNOSIS — D509 Iron deficiency anemia, unspecified: Secondary | ICD-10-CM | POA: Diagnosis not present

## 2014-10-07 DIAGNOSIS — E162 Hypoglycemia, unspecified: Secondary | ICD-10-CM | POA: Diagnosis not present

## 2014-10-07 DIAGNOSIS — N2581 Secondary hyperparathyroidism of renal origin: Secondary | ICD-10-CM | POA: Diagnosis not present

## 2014-10-10 DIAGNOSIS — E162 Hypoglycemia, unspecified: Secondary | ICD-10-CM | POA: Diagnosis not present

## 2014-10-10 DIAGNOSIS — E10649 Type 1 diabetes mellitus with hypoglycemia without coma: Secondary | ICD-10-CM | POA: Diagnosis not present

## 2014-10-10 DIAGNOSIS — D509 Iron deficiency anemia, unspecified: Secondary | ICD-10-CM | POA: Diagnosis not present

## 2014-10-10 DIAGNOSIS — N2581 Secondary hyperparathyroidism of renal origin: Secondary | ICD-10-CM | POA: Diagnosis not present

## 2014-10-10 DIAGNOSIS — N186 End stage renal disease: Secondary | ICD-10-CM | POA: Diagnosis not present

## 2014-10-10 DIAGNOSIS — Z992 Dependence on renal dialysis: Secondary | ICD-10-CM | POA: Diagnosis not present

## 2014-10-12 DIAGNOSIS — E10649 Type 1 diabetes mellitus with hypoglycemia without coma: Secondary | ICD-10-CM | POA: Diagnosis not present

## 2014-10-12 DIAGNOSIS — N186 End stage renal disease: Secondary | ICD-10-CM | POA: Diagnosis not present

## 2014-10-12 DIAGNOSIS — N2581 Secondary hyperparathyroidism of renal origin: Secondary | ICD-10-CM | POA: Diagnosis not present

## 2014-10-12 DIAGNOSIS — D509 Iron deficiency anemia, unspecified: Secondary | ICD-10-CM | POA: Diagnosis not present

## 2014-10-12 DIAGNOSIS — E162 Hypoglycemia, unspecified: Secondary | ICD-10-CM | POA: Diagnosis not present

## 2014-10-12 DIAGNOSIS — Z992 Dependence on renal dialysis: Secondary | ICD-10-CM | POA: Diagnosis not present

## 2014-10-14 DIAGNOSIS — E10649 Type 1 diabetes mellitus with hypoglycemia without coma: Secondary | ICD-10-CM | POA: Diagnosis not present

## 2014-10-14 DIAGNOSIS — N2581 Secondary hyperparathyroidism of renal origin: Secondary | ICD-10-CM | POA: Diagnosis not present

## 2014-10-14 DIAGNOSIS — E162 Hypoglycemia, unspecified: Secondary | ICD-10-CM | POA: Diagnosis not present

## 2014-10-14 DIAGNOSIS — N186 End stage renal disease: Secondary | ICD-10-CM | POA: Diagnosis not present

## 2014-10-14 DIAGNOSIS — D509 Iron deficiency anemia, unspecified: Secondary | ICD-10-CM | POA: Diagnosis not present

## 2014-10-14 DIAGNOSIS — Z992 Dependence on renal dialysis: Secondary | ICD-10-CM | POA: Diagnosis not present

## 2014-10-16 DIAGNOSIS — N186 End stage renal disease: Secondary | ICD-10-CM | POA: Diagnosis not present

## 2014-10-16 DIAGNOSIS — Z992 Dependence on renal dialysis: Secondary | ICD-10-CM | POA: Diagnosis not present

## 2014-10-16 DIAGNOSIS — Z4931 Encounter for adequacy testing for hemodialysis: Secondary | ICD-10-CM | POA: Diagnosis not present

## 2014-10-17 DIAGNOSIS — E10649 Type 1 diabetes mellitus with hypoglycemia without coma: Secondary | ICD-10-CM | POA: Diagnosis not present

## 2014-10-17 DIAGNOSIS — E162 Hypoglycemia, unspecified: Secondary | ICD-10-CM | POA: Diagnosis not present

## 2014-10-17 DIAGNOSIS — D509 Iron deficiency anemia, unspecified: Secondary | ICD-10-CM | POA: Diagnosis not present

## 2014-10-17 DIAGNOSIS — N2581 Secondary hyperparathyroidism of renal origin: Secondary | ICD-10-CM | POA: Diagnosis not present

## 2014-10-17 DIAGNOSIS — Z992 Dependence on renal dialysis: Secondary | ICD-10-CM | POA: Diagnosis not present

## 2014-10-17 DIAGNOSIS — N186 End stage renal disease: Secondary | ICD-10-CM | POA: Diagnosis not present

## 2014-10-17 DIAGNOSIS — Z794 Long term (current) use of insulin: Secondary | ICD-10-CM | POA: Diagnosis not present

## 2014-10-19 DIAGNOSIS — N186 End stage renal disease: Secondary | ICD-10-CM | POA: Diagnosis not present

## 2014-10-19 DIAGNOSIS — D509 Iron deficiency anemia, unspecified: Secondary | ICD-10-CM | POA: Diagnosis not present

## 2014-10-19 DIAGNOSIS — E10649 Type 1 diabetes mellitus with hypoglycemia without coma: Secondary | ICD-10-CM | POA: Diagnosis not present

## 2014-10-19 DIAGNOSIS — E162 Hypoglycemia, unspecified: Secondary | ICD-10-CM | POA: Diagnosis not present

## 2014-10-19 DIAGNOSIS — Z992 Dependence on renal dialysis: Secondary | ICD-10-CM | POA: Diagnosis not present

## 2014-10-19 DIAGNOSIS — N2581 Secondary hyperparathyroidism of renal origin: Secondary | ICD-10-CM | POA: Diagnosis not present

## 2014-10-21 DIAGNOSIS — E162 Hypoglycemia, unspecified: Secondary | ICD-10-CM | POA: Diagnosis not present

## 2014-10-21 DIAGNOSIS — D509 Iron deficiency anemia, unspecified: Secondary | ICD-10-CM | POA: Diagnosis not present

## 2014-10-21 DIAGNOSIS — N186 End stage renal disease: Secondary | ICD-10-CM | POA: Diagnosis not present

## 2014-10-21 DIAGNOSIS — Z992 Dependence on renal dialysis: Secondary | ICD-10-CM | POA: Diagnosis not present

## 2014-10-21 DIAGNOSIS — N2581 Secondary hyperparathyroidism of renal origin: Secondary | ICD-10-CM | POA: Diagnosis not present

## 2014-10-21 DIAGNOSIS — E10649 Type 1 diabetes mellitus with hypoglycemia without coma: Secondary | ICD-10-CM | POA: Diagnosis not present

## 2014-10-24 DIAGNOSIS — D509 Iron deficiency anemia, unspecified: Secondary | ICD-10-CM | POA: Diagnosis not present

## 2014-10-24 DIAGNOSIS — Z992 Dependence on renal dialysis: Secondary | ICD-10-CM | POA: Diagnosis not present

## 2014-10-24 DIAGNOSIS — E162 Hypoglycemia, unspecified: Secondary | ICD-10-CM | POA: Diagnosis not present

## 2014-10-24 DIAGNOSIS — E10649 Type 1 diabetes mellitus with hypoglycemia without coma: Secondary | ICD-10-CM | POA: Diagnosis not present

## 2014-10-24 DIAGNOSIS — N186 End stage renal disease: Secondary | ICD-10-CM | POA: Diagnosis not present

## 2014-10-24 DIAGNOSIS — N2581 Secondary hyperparathyroidism of renal origin: Secondary | ICD-10-CM | POA: Diagnosis not present

## 2014-10-26 DIAGNOSIS — E10649 Type 1 diabetes mellitus with hypoglycemia without coma: Secondary | ICD-10-CM | POA: Diagnosis not present

## 2014-10-26 DIAGNOSIS — N2581 Secondary hyperparathyroidism of renal origin: Secondary | ICD-10-CM | POA: Diagnosis not present

## 2014-10-26 DIAGNOSIS — D509 Iron deficiency anemia, unspecified: Secondary | ICD-10-CM | POA: Diagnosis not present

## 2014-10-26 DIAGNOSIS — Z992 Dependence on renal dialysis: Secondary | ICD-10-CM | POA: Diagnosis not present

## 2014-10-26 DIAGNOSIS — N186 End stage renal disease: Secondary | ICD-10-CM | POA: Diagnosis not present

## 2014-10-26 DIAGNOSIS — E162 Hypoglycemia, unspecified: Secondary | ICD-10-CM | POA: Diagnosis not present

## 2014-10-28 DIAGNOSIS — E162 Hypoglycemia, unspecified: Secondary | ICD-10-CM | POA: Diagnosis not present

## 2014-10-28 DIAGNOSIS — N186 End stage renal disease: Secondary | ICD-10-CM | POA: Diagnosis not present

## 2014-10-28 DIAGNOSIS — D509 Iron deficiency anemia, unspecified: Secondary | ICD-10-CM | POA: Diagnosis not present

## 2014-10-28 DIAGNOSIS — Z992 Dependence on renal dialysis: Secondary | ICD-10-CM | POA: Diagnosis not present

## 2014-10-28 DIAGNOSIS — E10649 Type 1 diabetes mellitus with hypoglycemia without coma: Secondary | ICD-10-CM | POA: Diagnosis not present

## 2014-10-28 DIAGNOSIS — N2581 Secondary hyperparathyroidism of renal origin: Secondary | ICD-10-CM | POA: Diagnosis not present

## 2014-10-31 DIAGNOSIS — N186 End stage renal disease: Secondary | ICD-10-CM | POA: Diagnosis not present

## 2014-10-31 DIAGNOSIS — Z992 Dependence on renal dialysis: Secondary | ICD-10-CM | POA: Diagnosis not present

## 2014-10-31 DIAGNOSIS — E10649 Type 1 diabetes mellitus with hypoglycemia without coma: Secondary | ICD-10-CM | POA: Diagnosis not present

## 2014-10-31 DIAGNOSIS — N2581 Secondary hyperparathyroidism of renal origin: Secondary | ICD-10-CM | POA: Diagnosis not present

## 2014-10-31 DIAGNOSIS — E162 Hypoglycemia, unspecified: Secondary | ICD-10-CM | POA: Diagnosis not present

## 2014-10-31 DIAGNOSIS — E119 Type 2 diabetes mellitus without complications: Secondary | ICD-10-CM | POA: Diagnosis not present

## 2014-10-31 DIAGNOSIS — Z794 Long term (current) use of insulin: Secondary | ICD-10-CM | POA: Diagnosis not present

## 2014-10-31 DIAGNOSIS — D509 Iron deficiency anemia, unspecified: Secondary | ICD-10-CM | POA: Diagnosis not present

## 2014-11-02 DIAGNOSIS — D509 Iron deficiency anemia, unspecified: Secondary | ICD-10-CM | POA: Diagnosis not present

## 2014-11-02 DIAGNOSIS — E162 Hypoglycemia, unspecified: Secondary | ICD-10-CM | POA: Diagnosis not present

## 2014-11-02 DIAGNOSIS — E10649 Type 1 diabetes mellitus with hypoglycemia without coma: Secondary | ICD-10-CM | POA: Diagnosis not present

## 2014-11-02 DIAGNOSIS — Z992 Dependence on renal dialysis: Secondary | ICD-10-CM | POA: Diagnosis not present

## 2014-11-02 DIAGNOSIS — N186 End stage renal disease: Secondary | ICD-10-CM | POA: Diagnosis not present

## 2014-11-02 DIAGNOSIS — N2581 Secondary hyperparathyroidism of renal origin: Secondary | ICD-10-CM | POA: Diagnosis not present

## 2014-11-04 DIAGNOSIS — N186 End stage renal disease: Secondary | ICD-10-CM | POA: Diagnosis not present

## 2014-11-04 DIAGNOSIS — Z992 Dependence on renal dialysis: Secondary | ICD-10-CM | POA: Diagnosis not present

## 2014-11-04 DIAGNOSIS — N2581 Secondary hyperparathyroidism of renal origin: Secondary | ICD-10-CM | POA: Diagnosis not present

## 2014-11-04 DIAGNOSIS — D509 Iron deficiency anemia, unspecified: Secondary | ICD-10-CM | POA: Diagnosis not present

## 2014-11-04 DIAGNOSIS — E10649 Type 1 diabetes mellitus with hypoglycemia without coma: Secondary | ICD-10-CM | POA: Diagnosis not present

## 2014-11-04 DIAGNOSIS — E162 Hypoglycemia, unspecified: Secondary | ICD-10-CM | POA: Diagnosis not present

## 2014-11-07 DIAGNOSIS — Z992 Dependence on renal dialysis: Secondary | ICD-10-CM | POA: Diagnosis not present

## 2014-11-07 DIAGNOSIS — E162 Hypoglycemia, unspecified: Secondary | ICD-10-CM | POA: Diagnosis not present

## 2014-11-07 DIAGNOSIS — N2581 Secondary hyperparathyroidism of renal origin: Secondary | ICD-10-CM | POA: Diagnosis not present

## 2014-11-07 DIAGNOSIS — D509 Iron deficiency anemia, unspecified: Secondary | ICD-10-CM | POA: Diagnosis not present

## 2014-11-07 DIAGNOSIS — E10649 Type 1 diabetes mellitus with hypoglycemia without coma: Secondary | ICD-10-CM | POA: Diagnosis not present

## 2014-11-07 DIAGNOSIS — N186 End stage renal disease: Secondary | ICD-10-CM | POA: Diagnosis not present

## 2014-11-09 DIAGNOSIS — N186 End stage renal disease: Secondary | ICD-10-CM | POA: Diagnosis not present

## 2014-11-09 DIAGNOSIS — D509 Iron deficiency anemia, unspecified: Secondary | ICD-10-CM | POA: Diagnosis not present

## 2014-11-09 DIAGNOSIS — Z992 Dependence on renal dialysis: Secondary | ICD-10-CM | POA: Diagnosis not present

## 2014-11-09 DIAGNOSIS — N2581 Secondary hyperparathyroidism of renal origin: Secondary | ICD-10-CM | POA: Diagnosis not present

## 2014-11-09 DIAGNOSIS — E162 Hypoglycemia, unspecified: Secondary | ICD-10-CM | POA: Diagnosis not present

## 2014-11-09 DIAGNOSIS — E10649 Type 1 diabetes mellitus with hypoglycemia without coma: Secondary | ICD-10-CM | POA: Diagnosis not present

## 2014-11-10 DIAGNOSIS — E039 Hypothyroidism, unspecified: Secondary | ICD-10-CM | POA: Diagnosis not present

## 2014-11-10 DIAGNOSIS — I5022 Chronic systolic (congestive) heart failure: Secondary | ICD-10-CM | POA: Diagnosis not present

## 2014-11-10 DIAGNOSIS — E1165 Type 2 diabetes mellitus with hyperglycemia: Secondary | ICD-10-CM | POA: Diagnosis not present

## 2014-11-10 DIAGNOSIS — Z992 Dependence on renal dialysis: Secondary | ICD-10-CM | POA: Diagnosis not present

## 2014-11-10 DIAGNOSIS — N186 End stage renal disease: Secondary | ICD-10-CM | POA: Diagnosis not present

## 2014-11-11 DIAGNOSIS — D509 Iron deficiency anemia, unspecified: Secondary | ICD-10-CM | POA: Diagnosis not present

## 2014-11-11 DIAGNOSIS — Z992 Dependence on renal dialysis: Secondary | ICD-10-CM | POA: Diagnosis not present

## 2014-11-11 DIAGNOSIS — N186 End stage renal disease: Secondary | ICD-10-CM | POA: Diagnosis not present

## 2014-11-11 DIAGNOSIS — E10649 Type 1 diabetes mellitus with hypoglycemia without coma: Secondary | ICD-10-CM | POA: Diagnosis not present

## 2014-11-11 DIAGNOSIS — E162 Hypoglycemia, unspecified: Secondary | ICD-10-CM | POA: Diagnosis not present

## 2014-11-11 DIAGNOSIS — N2581 Secondary hyperparathyroidism of renal origin: Secondary | ICD-10-CM | POA: Diagnosis not present

## 2014-11-14 DIAGNOSIS — E10649 Type 1 diabetes mellitus with hypoglycemia without coma: Secondary | ICD-10-CM | POA: Diagnosis not present

## 2014-11-14 DIAGNOSIS — N186 End stage renal disease: Secondary | ICD-10-CM | POA: Diagnosis not present

## 2014-11-14 DIAGNOSIS — N2581 Secondary hyperparathyroidism of renal origin: Secondary | ICD-10-CM | POA: Diagnosis not present

## 2014-11-14 DIAGNOSIS — D509 Iron deficiency anemia, unspecified: Secondary | ICD-10-CM | POA: Diagnosis not present

## 2014-11-14 DIAGNOSIS — Z992 Dependence on renal dialysis: Secondary | ICD-10-CM | POA: Diagnosis not present

## 2014-11-14 DIAGNOSIS — E162 Hypoglycemia, unspecified: Secondary | ICD-10-CM | POA: Diagnosis not present

## 2014-11-16 DIAGNOSIS — E162 Hypoglycemia, unspecified: Secondary | ICD-10-CM | POA: Diagnosis not present

## 2014-11-16 DIAGNOSIS — Z992 Dependence on renal dialysis: Secondary | ICD-10-CM | POA: Diagnosis not present

## 2014-11-16 DIAGNOSIS — D509 Iron deficiency anemia, unspecified: Secondary | ICD-10-CM | POA: Diagnosis not present

## 2014-11-16 DIAGNOSIS — E10649 Type 1 diabetes mellitus with hypoglycemia without coma: Secondary | ICD-10-CM | POA: Diagnosis not present

## 2014-11-16 DIAGNOSIS — N186 End stage renal disease: Secondary | ICD-10-CM | POA: Diagnosis not present

## 2014-11-16 DIAGNOSIS — N2581 Secondary hyperparathyroidism of renal origin: Secondary | ICD-10-CM | POA: Diagnosis not present

## 2014-11-16 DIAGNOSIS — Z4931 Encounter for adequacy testing for hemodialysis: Secondary | ICD-10-CM | POA: Diagnosis not present

## 2014-11-18 DIAGNOSIS — E10649 Type 1 diabetes mellitus with hypoglycemia without coma: Secondary | ICD-10-CM | POA: Diagnosis not present

## 2014-11-18 DIAGNOSIS — N186 End stage renal disease: Secondary | ICD-10-CM | POA: Diagnosis not present

## 2014-11-18 DIAGNOSIS — E162 Hypoglycemia, unspecified: Secondary | ICD-10-CM | POA: Diagnosis not present

## 2014-11-18 DIAGNOSIS — D509 Iron deficiency anemia, unspecified: Secondary | ICD-10-CM | POA: Diagnosis not present

## 2014-11-18 DIAGNOSIS — Z992 Dependence on renal dialysis: Secondary | ICD-10-CM | POA: Diagnosis not present

## 2014-11-18 DIAGNOSIS — N2581 Secondary hyperparathyroidism of renal origin: Secondary | ICD-10-CM | POA: Diagnosis not present

## 2014-11-18 DIAGNOSIS — Z794 Long term (current) use of insulin: Secondary | ICD-10-CM | POA: Diagnosis not present

## 2014-11-18 DIAGNOSIS — Z23 Encounter for immunization: Secondary | ICD-10-CM | POA: Diagnosis not present

## 2014-11-21 DIAGNOSIS — N186 End stage renal disease: Secondary | ICD-10-CM | POA: Diagnosis not present

## 2014-11-21 DIAGNOSIS — E162 Hypoglycemia, unspecified: Secondary | ICD-10-CM | POA: Diagnosis not present

## 2014-11-21 DIAGNOSIS — Z992 Dependence on renal dialysis: Secondary | ICD-10-CM | POA: Diagnosis not present

## 2014-11-21 DIAGNOSIS — N2581 Secondary hyperparathyroidism of renal origin: Secondary | ICD-10-CM | POA: Diagnosis not present

## 2014-11-21 DIAGNOSIS — Z23 Encounter for immunization: Secondary | ICD-10-CM | POA: Diagnosis not present

## 2014-11-21 DIAGNOSIS — D509 Iron deficiency anemia, unspecified: Secondary | ICD-10-CM | POA: Diagnosis not present

## 2014-11-23 DIAGNOSIS — E162 Hypoglycemia, unspecified: Secondary | ICD-10-CM | POA: Diagnosis not present

## 2014-11-23 DIAGNOSIS — N2581 Secondary hyperparathyroidism of renal origin: Secondary | ICD-10-CM | POA: Diagnosis not present

## 2014-11-23 DIAGNOSIS — Z992 Dependence on renal dialysis: Secondary | ICD-10-CM | POA: Diagnosis not present

## 2014-11-23 DIAGNOSIS — Z23 Encounter for immunization: Secondary | ICD-10-CM | POA: Diagnosis not present

## 2014-11-23 DIAGNOSIS — N186 End stage renal disease: Secondary | ICD-10-CM | POA: Diagnosis not present

## 2014-11-23 DIAGNOSIS — D509 Iron deficiency anemia, unspecified: Secondary | ICD-10-CM | POA: Diagnosis not present

## 2014-11-24 DIAGNOSIS — K529 Noninfective gastroenteritis and colitis, unspecified: Secondary | ICD-10-CM | POA: Diagnosis not present

## 2014-11-25 DIAGNOSIS — D509 Iron deficiency anemia, unspecified: Secondary | ICD-10-CM | POA: Diagnosis not present

## 2014-11-25 DIAGNOSIS — N2581 Secondary hyperparathyroidism of renal origin: Secondary | ICD-10-CM | POA: Diagnosis not present

## 2014-11-25 DIAGNOSIS — E162 Hypoglycemia, unspecified: Secondary | ICD-10-CM | POA: Diagnosis not present

## 2014-11-25 DIAGNOSIS — Z992 Dependence on renal dialysis: Secondary | ICD-10-CM | POA: Diagnosis not present

## 2014-11-25 DIAGNOSIS — Z23 Encounter for immunization: Secondary | ICD-10-CM | POA: Diagnosis not present

## 2014-11-25 DIAGNOSIS — N186 End stage renal disease: Secondary | ICD-10-CM | POA: Diagnosis not present

## 2014-11-28 DIAGNOSIS — D509 Iron deficiency anemia, unspecified: Secondary | ICD-10-CM | POA: Diagnosis not present

## 2014-11-28 DIAGNOSIS — N2581 Secondary hyperparathyroidism of renal origin: Secondary | ICD-10-CM | POA: Diagnosis not present

## 2014-11-28 DIAGNOSIS — E162 Hypoglycemia, unspecified: Secondary | ICD-10-CM | POA: Diagnosis not present

## 2014-11-28 DIAGNOSIS — K529 Noninfective gastroenteritis and colitis, unspecified: Secondary | ICD-10-CM | POA: Diagnosis not present

## 2014-11-28 DIAGNOSIS — N186 End stage renal disease: Secondary | ICD-10-CM | POA: Diagnosis not present

## 2014-11-28 DIAGNOSIS — Z23 Encounter for immunization: Secondary | ICD-10-CM | POA: Diagnosis not present

## 2014-11-28 DIAGNOSIS — Z992 Dependence on renal dialysis: Secondary | ICD-10-CM | POA: Diagnosis not present

## 2014-11-30 DIAGNOSIS — D509 Iron deficiency anemia, unspecified: Secondary | ICD-10-CM | POA: Diagnosis not present

## 2014-11-30 DIAGNOSIS — E162 Hypoglycemia, unspecified: Secondary | ICD-10-CM | POA: Diagnosis not present

## 2014-11-30 DIAGNOSIS — N186 End stage renal disease: Secondary | ICD-10-CM | POA: Diagnosis not present

## 2014-11-30 DIAGNOSIS — N2581 Secondary hyperparathyroidism of renal origin: Secondary | ICD-10-CM | POA: Diagnosis not present

## 2014-11-30 DIAGNOSIS — Z992 Dependence on renal dialysis: Secondary | ICD-10-CM | POA: Diagnosis not present

## 2014-11-30 DIAGNOSIS — Z23 Encounter for immunization: Secondary | ICD-10-CM | POA: Diagnosis not present

## 2014-12-02 DIAGNOSIS — D509 Iron deficiency anemia, unspecified: Secondary | ICD-10-CM | POA: Diagnosis not present

## 2014-12-02 DIAGNOSIS — N186 End stage renal disease: Secondary | ICD-10-CM | POA: Diagnosis not present

## 2014-12-02 DIAGNOSIS — Z992 Dependence on renal dialysis: Secondary | ICD-10-CM | POA: Diagnosis not present

## 2014-12-02 DIAGNOSIS — Z23 Encounter for immunization: Secondary | ICD-10-CM | POA: Diagnosis not present

## 2014-12-02 DIAGNOSIS — E162 Hypoglycemia, unspecified: Secondary | ICD-10-CM | POA: Diagnosis not present

## 2014-12-02 DIAGNOSIS — N2581 Secondary hyperparathyroidism of renal origin: Secondary | ICD-10-CM | POA: Diagnosis not present

## 2014-12-05 DIAGNOSIS — N2581 Secondary hyperparathyroidism of renal origin: Secondary | ICD-10-CM | POA: Diagnosis not present

## 2014-12-05 DIAGNOSIS — N186 End stage renal disease: Secondary | ICD-10-CM | POA: Diagnosis not present

## 2014-12-05 DIAGNOSIS — Z23 Encounter for immunization: Secondary | ICD-10-CM | POA: Diagnosis not present

## 2014-12-05 DIAGNOSIS — Z992 Dependence on renal dialysis: Secondary | ICD-10-CM | POA: Diagnosis not present

## 2014-12-05 DIAGNOSIS — E162 Hypoglycemia, unspecified: Secondary | ICD-10-CM | POA: Diagnosis not present

## 2014-12-05 DIAGNOSIS — D509 Iron deficiency anemia, unspecified: Secondary | ICD-10-CM | POA: Diagnosis not present

## 2014-12-07 DIAGNOSIS — E162 Hypoglycemia, unspecified: Secondary | ICD-10-CM | POA: Diagnosis not present

## 2014-12-07 DIAGNOSIS — Z992 Dependence on renal dialysis: Secondary | ICD-10-CM | POA: Diagnosis not present

## 2014-12-07 DIAGNOSIS — D509 Iron deficiency anemia, unspecified: Secondary | ICD-10-CM | POA: Diagnosis not present

## 2014-12-07 DIAGNOSIS — N2581 Secondary hyperparathyroidism of renal origin: Secondary | ICD-10-CM | POA: Diagnosis not present

## 2014-12-07 DIAGNOSIS — Z23 Encounter for immunization: Secondary | ICD-10-CM | POA: Diagnosis not present

## 2014-12-07 DIAGNOSIS — N186 End stage renal disease: Secondary | ICD-10-CM | POA: Diagnosis not present

## 2014-12-09 DIAGNOSIS — Z992 Dependence on renal dialysis: Secondary | ICD-10-CM | POA: Diagnosis not present

## 2014-12-09 DIAGNOSIS — E162 Hypoglycemia, unspecified: Secondary | ICD-10-CM | POA: Diagnosis not present

## 2014-12-09 DIAGNOSIS — N2581 Secondary hyperparathyroidism of renal origin: Secondary | ICD-10-CM | POA: Diagnosis not present

## 2014-12-09 DIAGNOSIS — N186 End stage renal disease: Secondary | ICD-10-CM | POA: Diagnosis not present

## 2014-12-09 DIAGNOSIS — D509 Iron deficiency anemia, unspecified: Secondary | ICD-10-CM | POA: Diagnosis not present

## 2014-12-09 DIAGNOSIS — Z23 Encounter for immunization: Secondary | ICD-10-CM | POA: Diagnosis not present

## 2014-12-12 DIAGNOSIS — D509 Iron deficiency anemia, unspecified: Secondary | ICD-10-CM | POA: Diagnosis not present

## 2014-12-12 DIAGNOSIS — N2581 Secondary hyperparathyroidism of renal origin: Secondary | ICD-10-CM | POA: Diagnosis not present

## 2014-12-12 DIAGNOSIS — E162 Hypoglycemia, unspecified: Secondary | ICD-10-CM | POA: Diagnosis not present

## 2014-12-12 DIAGNOSIS — N186 End stage renal disease: Secondary | ICD-10-CM | POA: Diagnosis not present

## 2014-12-12 DIAGNOSIS — Z23 Encounter for immunization: Secondary | ICD-10-CM | POA: Diagnosis not present

## 2014-12-12 DIAGNOSIS — Z992 Dependence on renal dialysis: Secondary | ICD-10-CM | POA: Diagnosis not present

## 2014-12-14 DIAGNOSIS — N186 End stage renal disease: Secondary | ICD-10-CM | POA: Diagnosis not present

## 2014-12-14 DIAGNOSIS — Z992 Dependence on renal dialysis: Secondary | ICD-10-CM | POA: Diagnosis not present

## 2014-12-14 DIAGNOSIS — D509 Iron deficiency anemia, unspecified: Secondary | ICD-10-CM | POA: Diagnosis not present

## 2014-12-14 DIAGNOSIS — N2581 Secondary hyperparathyroidism of renal origin: Secondary | ICD-10-CM | POA: Diagnosis not present

## 2014-12-14 DIAGNOSIS — E162 Hypoglycemia, unspecified: Secondary | ICD-10-CM | POA: Diagnosis not present

## 2014-12-14 DIAGNOSIS — Z23 Encounter for immunization: Secondary | ICD-10-CM | POA: Diagnosis not present

## 2014-12-15 DIAGNOSIS — I480 Paroxysmal atrial fibrillation: Secondary | ICD-10-CM | POA: Diagnosis not present

## 2014-12-15 DIAGNOSIS — I509 Heart failure, unspecified: Secondary | ICD-10-CM | POA: Diagnosis not present

## 2014-12-16 DIAGNOSIS — N2581 Secondary hyperparathyroidism of renal origin: Secondary | ICD-10-CM | POA: Diagnosis not present

## 2014-12-16 DIAGNOSIS — Z23 Encounter for immunization: Secondary | ICD-10-CM | POA: Diagnosis not present

## 2014-12-16 DIAGNOSIS — N186 End stage renal disease: Secondary | ICD-10-CM | POA: Diagnosis not present

## 2014-12-16 DIAGNOSIS — E162 Hypoglycemia, unspecified: Secondary | ICD-10-CM | POA: Diagnosis not present

## 2014-12-16 DIAGNOSIS — Z4931 Encounter for adequacy testing for hemodialysis: Secondary | ICD-10-CM | POA: Diagnosis not present

## 2014-12-16 DIAGNOSIS — Z992 Dependence on renal dialysis: Secondary | ICD-10-CM | POA: Diagnosis not present

## 2014-12-16 DIAGNOSIS — D509 Iron deficiency anemia, unspecified: Secondary | ICD-10-CM | POA: Diagnosis not present

## 2014-12-19 DIAGNOSIS — Z992 Dependence on renal dialysis: Secondary | ICD-10-CM | POA: Diagnosis not present

## 2014-12-19 DIAGNOSIS — E10649 Type 1 diabetes mellitus with hypoglycemia without coma: Secondary | ICD-10-CM | POA: Diagnosis not present

## 2014-12-19 DIAGNOSIS — N186 End stage renal disease: Secondary | ICD-10-CM | POA: Diagnosis not present

## 2014-12-19 DIAGNOSIS — N2581 Secondary hyperparathyroidism of renal origin: Secondary | ICD-10-CM | POA: Diagnosis not present

## 2014-12-19 DIAGNOSIS — Z794 Long term (current) use of insulin: Secondary | ICD-10-CM | POA: Diagnosis not present

## 2014-12-19 DIAGNOSIS — E162 Hypoglycemia, unspecified: Secondary | ICD-10-CM | POA: Diagnosis not present

## 2014-12-19 DIAGNOSIS — D509 Iron deficiency anemia, unspecified: Secondary | ICD-10-CM | POA: Diagnosis not present

## 2014-12-20 DIAGNOSIS — E109 Type 1 diabetes mellitus without complications: Secondary | ICD-10-CM | POA: Diagnosis not present

## 2014-12-21 DIAGNOSIS — N186 End stage renal disease: Secondary | ICD-10-CM | POA: Diagnosis not present

## 2014-12-21 DIAGNOSIS — D509 Iron deficiency anemia, unspecified: Secondary | ICD-10-CM | POA: Diagnosis not present

## 2014-12-21 DIAGNOSIS — E162 Hypoglycemia, unspecified: Secondary | ICD-10-CM | POA: Diagnosis not present

## 2014-12-21 DIAGNOSIS — N2581 Secondary hyperparathyroidism of renal origin: Secondary | ICD-10-CM | POA: Diagnosis not present

## 2014-12-21 DIAGNOSIS — Z992 Dependence on renal dialysis: Secondary | ICD-10-CM | POA: Diagnosis not present

## 2014-12-21 DIAGNOSIS — E10649 Type 1 diabetes mellitus with hypoglycemia without coma: Secondary | ICD-10-CM | POA: Diagnosis not present

## 2014-12-23 DIAGNOSIS — D509 Iron deficiency anemia, unspecified: Secondary | ICD-10-CM | POA: Diagnosis not present

## 2014-12-23 DIAGNOSIS — E162 Hypoglycemia, unspecified: Secondary | ICD-10-CM | POA: Diagnosis not present

## 2014-12-23 DIAGNOSIS — N2581 Secondary hyperparathyroidism of renal origin: Secondary | ICD-10-CM | POA: Diagnosis not present

## 2014-12-23 DIAGNOSIS — Z992 Dependence on renal dialysis: Secondary | ICD-10-CM | POA: Diagnosis not present

## 2014-12-23 DIAGNOSIS — E10649 Type 1 diabetes mellitus with hypoglycemia without coma: Secondary | ICD-10-CM | POA: Diagnosis not present

## 2014-12-23 DIAGNOSIS — N186 End stage renal disease: Secondary | ICD-10-CM | POA: Diagnosis not present

## 2014-12-26 DIAGNOSIS — Z992 Dependence on renal dialysis: Secondary | ICD-10-CM | POA: Diagnosis not present

## 2014-12-26 DIAGNOSIS — E162 Hypoglycemia, unspecified: Secondary | ICD-10-CM | POA: Diagnosis not present

## 2014-12-26 DIAGNOSIS — N186 End stage renal disease: Secondary | ICD-10-CM | POA: Diagnosis not present

## 2014-12-26 DIAGNOSIS — E10649 Type 1 diabetes mellitus with hypoglycemia without coma: Secondary | ICD-10-CM | POA: Diagnosis not present

## 2014-12-26 DIAGNOSIS — N2581 Secondary hyperparathyroidism of renal origin: Secondary | ICD-10-CM | POA: Diagnosis not present

## 2014-12-26 DIAGNOSIS — D509 Iron deficiency anemia, unspecified: Secondary | ICD-10-CM | POA: Diagnosis not present

## 2014-12-27 DIAGNOSIS — I509 Heart failure, unspecified: Secondary | ICD-10-CM | POA: Diagnosis not present

## 2014-12-27 DIAGNOSIS — I480 Paroxysmal atrial fibrillation: Secondary | ICD-10-CM | POA: Diagnosis not present

## 2014-12-28 DIAGNOSIS — E162 Hypoglycemia, unspecified: Secondary | ICD-10-CM | POA: Diagnosis not present

## 2014-12-28 DIAGNOSIS — E10649 Type 1 diabetes mellitus with hypoglycemia without coma: Secondary | ICD-10-CM | POA: Diagnosis not present

## 2014-12-28 DIAGNOSIS — Z992 Dependence on renal dialysis: Secondary | ICD-10-CM | POA: Diagnosis not present

## 2014-12-28 DIAGNOSIS — D509 Iron deficiency anemia, unspecified: Secondary | ICD-10-CM | POA: Diagnosis not present

## 2014-12-28 DIAGNOSIS — N186 End stage renal disease: Secondary | ICD-10-CM | POA: Diagnosis not present

## 2014-12-28 DIAGNOSIS — N2581 Secondary hyperparathyroidism of renal origin: Secondary | ICD-10-CM | POA: Diagnosis not present

## 2014-12-30 DIAGNOSIS — E10649 Type 1 diabetes mellitus with hypoglycemia without coma: Secondary | ICD-10-CM | POA: Diagnosis not present

## 2014-12-30 DIAGNOSIS — Z992 Dependence on renal dialysis: Secondary | ICD-10-CM | POA: Diagnosis not present

## 2014-12-30 DIAGNOSIS — N186 End stage renal disease: Secondary | ICD-10-CM | POA: Diagnosis not present

## 2014-12-30 DIAGNOSIS — D509 Iron deficiency anemia, unspecified: Secondary | ICD-10-CM | POA: Diagnosis not present

## 2014-12-30 DIAGNOSIS — N2581 Secondary hyperparathyroidism of renal origin: Secondary | ICD-10-CM | POA: Diagnosis not present

## 2014-12-30 DIAGNOSIS — E162 Hypoglycemia, unspecified: Secondary | ICD-10-CM | POA: Diagnosis not present

## 2015-01-02 DIAGNOSIS — N2581 Secondary hyperparathyroidism of renal origin: Secondary | ICD-10-CM | POA: Diagnosis not present

## 2015-01-02 DIAGNOSIS — D509 Iron deficiency anemia, unspecified: Secondary | ICD-10-CM | POA: Diagnosis not present

## 2015-01-02 DIAGNOSIS — N186 End stage renal disease: Secondary | ICD-10-CM | POA: Diagnosis not present

## 2015-01-02 DIAGNOSIS — E162 Hypoglycemia, unspecified: Secondary | ICD-10-CM | POA: Diagnosis not present

## 2015-01-02 DIAGNOSIS — E10649 Type 1 diabetes mellitus with hypoglycemia without coma: Secondary | ICD-10-CM | POA: Diagnosis not present

## 2015-01-02 DIAGNOSIS — Z992 Dependence on renal dialysis: Secondary | ICD-10-CM | POA: Diagnosis not present

## 2015-01-04 DIAGNOSIS — E10649 Type 1 diabetes mellitus with hypoglycemia without coma: Secondary | ICD-10-CM | POA: Diagnosis not present

## 2015-01-04 DIAGNOSIS — Z992 Dependence on renal dialysis: Secondary | ICD-10-CM | POA: Diagnosis not present

## 2015-01-04 DIAGNOSIS — N2581 Secondary hyperparathyroidism of renal origin: Secondary | ICD-10-CM | POA: Diagnosis not present

## 2015-01-04 DIAGNOSIS — N186 End stage renal disease: Secondary | ICD-10-CM | POA: Diagnosis not present

## 2015-01-04 DIAGNOSIS — D509 Iron deficiency anemia, unspecified: Secondary | ICD-10-CM | POA: Diagnosis not present

## 2015-01-04 DIAGNOSIS — E162 Hypoglycemia, unspecified: Secondary | ICD-10-CM | POA: Diagnosis not present

## 2015-01-05 DIAGNOSIS — K529 Noninfective gastroenteritis and colitis, unspecified: Secondary | ICD-10-CM | POA: Diagnosis not present

## 2015-01-06 DIAGNOSIS — D509 Iron deficiency anemia, unspecified: Secondary | ICD-10-CM | POA: Diagnosis not present

## 2015-01-06 DIAGNOSIS — E162 Hypoglycemia, unspecified: Secondary | ICD-10-CM | POA: Diagnosis not present

## 2015-01-06 DIAGNOSIS — N2581 Secondary hyperparathyroidism of renal origin: Secondary | ICD-10-CM | POA: Diagnosis not present

## 2015-01-06 DIAGNOSIS — N186 End stage renal disease: Secondary | ICD-10-CM | POA: Diagnosis not present

## 2015-01-06 DIAGNOSIS — Z992 Dependence on renal dialysis: Secondary | ICD-10-CM | POA: Diagnosis not present

## 2015-01-06 DIAGNOSIS — E10649 Type 1 diabetes mellitus with hypoglycemia without coma: Secondary | ICD-10-CM | POA: Diagnosis not present

## 2015-01-09 DIAGNOSIS — E162 Hypoglycemia, unspecified: Secondary | ICD-10-CM | POA: Diagnosis not present

## 2015-01-09 DIAGNOSIS — N2581 Secondary hyperparathyroidism of renal origin: Secondary | ICD-10-CM | POA: Diagnosis not present

## 2015-01-09 DIAGNOSIS — Z992 Dependence on renal dialysis: Secondary | ICD-10-CM | POA: Diagnosis not present

## 2015-01-09 DIAGNOSIS — E10649 Type 1 diabetes mellitus with hypoglycemia without coma: Secondary | ICD-10-CM | POA: Diagnosis not present

## 2015-01-09 DIAGNOSIS — D509 Iron deficiency anemia, unspecified: Secondary | ICD-10-CM | POA: Diagnosis not present

## 2015-01-09 DIAGNOSIS — N186 End stage renal disease: Secondary | ICD-10-CM | POA: Diagnosis not present

## 2015-01-11 DIAGNOSIS — Z992 Dependence on renal dialysis: Secondary | ICD-10-CM | POA: Diagnosis not present

## 2015-01-11 DIAGNOSIS — E162 Hypoglycemia, unspecified: Secondary | ICD-10-CM | POA: Diagnosis not present

## 2015-01-11 DIAGNOSIS — D509 Iron deficiency anemia, unspecified: Secondary | ICD-10-CM | POA: Diagnosis not present

## 2015-01-11 DIAGNOSIS — N186 End stage renal disease: Secondary | ICD-10-CM | POA: Diagnosis not present

## 2015-01-11 DIAGNOSIS — E10649 Type 1 diabetes mellitus with hypoglycemia without coma: Secondary | ICD-10-CM | POA: Diagnosis not present

## 2015-01-11 DIAGNOSIS — N2581 Secondary hyperparathyroidism of renal origin: Secondary | ICD-10-CM | POA: Diagnosis not present

## 2015-01-13 DIAGNOSIS — N186 End stage renal disease: Secondary | ICD-10-CM | POA: Diagnosis not present

## 2015-01-13 DIAGNOSIS — D509 Iron deficiency anemia, unspecified: Secondary | ICD-10-CM | POA: Diagnosis not present

## 2015-01-13 DIAGNOSIS — E10649 Type 1 diabetes mellitus with hypoglycemia without coma: Secondary | ICD-10-CM | POA: Diagnosis not present

## 2015-01-13 DIAGNOSIS — N2581 Secondary hyperparathyroidism of renal origin: Secondary | ICD-10-CM | POA: Diagnosis not present

## 2015-01-13 DIAGNOSIS — E162 Hypoglycemia, unspecified: Secondary | ICD-10-CM | POA: Diagnosis not present

## 2015-01-13 DIAGNOSIS — Z992 Dependence on renal dialysis: Secondary | ICD-10-CM | POA: Diagnosis not present

## 2015-01-16 DIAGNOSIS — E162 Hypoglycemia, unspecified: Secondary | ICD-10-CM | POA: Diagnosis not present

## 2015-01-16 DIAGNOSIS — Z4931 Encounter for adequacy testing for hemodialysis: Secondary | ICD-10-CM | POA: Diagnosis not present

## 2015-01-16 DIAGNOSIS — E1122 Type 2 diabetes mellitus with diabetic chronic kidney disease: Secondary | ICD-10-CM | POA: Diagnosis not present

## 2015-01-16 DIAGNOSIS — E10649 Type 1 diabetes mellitus with hypoglycemia without coma: Secondary | ICD-10-CM | POA: Diagnosis not present

## 2015-01-16 DIAGNOSIS — N186 End stage renal disease: Secondary | ICD-10-CM | POA: Diagnosis not present

## 2015-01-16 DIAGNOSIS — D509 Iron deficiency anemia, unspecified: Secondary | ICD-10-CM | POA: Diagnosis not present

## 2015-01-16 DIAGNOSIS — N2581 Secondary hyperparathyroidism of renal origin: Secondary | ICD-10-CM | POA: Diagnosis not present

## 2015-01-16 DIAGNOSIS — Z992 Dependence on renal dialysis: Secondary | ICD-10-CM | POA: Diagnosis not present

## 2015-01-18 DIAGNOSIS — D509 Iron deficiency anemia, unspecified: Secondary | ICD-10-CM | POA: Diagnosis not present

## 2015-01-18 DIAGNOSIS — N186 End stage renal disease: Secondary | ICD-10-CM | POA: Diagnosis not present

## 2015-01-18 DIAGNOSIS — Z794 Long term (current) use of insulin: Secondary | ICD-10-CM | POA: Diagnosis not present

## 2015-01-18 DIAGNOSIS — E162 Hypoglycemia, unspecified: Secondary | ICD-10-CM | POA: Diagnosis not present

## 2015-01-18 DIAGNOSIS — Z992 Dependence on renal dialysis: Secondary | ICD-10-CM | POA: Diagnosis not present

## 2015-01-18 DIAGNOSIS — E10649 Type 1 diabetes mellitus with hypoglycemia without coma: Secondary | ICD-10-CM | POA: Diagnosis not present

## 2015-01-18 DIAGNOSIS — N2581 Secondary hyperparathyroidism of renal origin: Secondary | ICD-10-CM | POA: Diagnosis not present

## 2015-01-20 DIAGNOSIS — D509 Iron deficiency anemia, unspecified: Secondary | ICD-10-CM | POA: Diagnosis not present

## 2015-01-20 DIAGNOSIS — E162 Hypoglycemia, unspecified: Secondary | ICD-10-CM | POA: Diagnosis not present

## 2015-01-20 DIAGNOSIS — N186 End stage renal disease: Secondary | ICD-10-CM | POA: Diagnosis not present

## 2015-01-20 DIAGNOSIS — E10649 Type 1 diabetes mellitus with hypoglycemia without coma: Secondary | ICD-10-CM | POA: Diagnosis not present

## 2015-01-20 DIAGNOSIS — Z992 Dependence on renal dialysis: Secondary | ICD-10-CM | POA: Diagnosis not present

## 2015-01-20 DIAGNOSIS — N2581 Secondary hyperparathyroidism of renal origin: Secondary | ICD-10-CM | POA: Diagnosis not present

## 2015-01-23 DIAGNOSIS — N2581 Secondary hyperparathyroidism of renal origin: Secondary | ICD-10-CM | POA: Diagnosis not present

## 2015-01-23 DIAGNOSIS — D509 Iron deficiency anemia, unspecified: Secondary | ICD-10-CM | POA: Diagnosis not present

## 2015-01-23 DIAGNOSIS — E162 Hypoglycemia, unspecified: Secondary | ICD-10-CM | POA: Diagnosis not present

## 2015-01-23 DIAGNOSIS — Z992 Dependence on renal dialysis: Secondary | ICD-10-CM | POA: Diagnosis not present

## 2015-01-23 DIAGNOSIS — N186 End stage renal disease: Secondary | ICD-10-CM | POA: Diagnosis not present

## 2015-01-23 DIAGNOSIS — E10649 Type 1 diabetes mellitus with hypoglycemia without coma: Secondary | ICD-10-CM | POA: Diagnosis not present

## 2015-01-25 DIAGNOSIS — Z992 Dependence on renal dialysis: Secondary | ICD-10-CM | POA: Diagnosis not present

## 2015-01-25 DIAGNOSIS — E10649 Type 1 diabetes mellitus with hypoglycemia without coma: Secondary | ICD-10-CM | POA: Diagnosis not present

## 2015-01-25 DIAGNOSIS — E162 Hypoglycemia, unspecified: Secondary | ICD-10-CM | POA: Diagnosis not present

## 2015-01-25 DIAGNOSIS — N186 End stage renal disease: Secondary | ICD-10-CM | POA: Diagnosis not present

## 2015-01-25 DIAGNOSIS — N2581 Secondary hyperparathyroidism of renal origin: Secondary | ICD-10-CM | POA: Diagnosis not present

## 2015-01-25 DIAGNOSIS — D509 Iron deficiency anemia, unspecified: Secondary | ICD-10-CM | POA: Diagnosis not present

## 2015-01-27 DIAGNOSIS — N2581 Secondary hyperparathyroidism of renal origin: Secondary | ICD-10-CM | POA: Diagnosis not present

## 2015-01-27 DIAGNOSIS — D509 Iron deficiency anemia, unspecified: Secondary | ICD-10-CM | POA: Diagnosis not present

## 2015-01-27 DIAGNOSIS — E162 Hypoglycemia, unspecified: Secondary | ICD-10-CM | POA: Diagnosis not present

## 2015-01-27 DIAGNOSIS — Z992 Dependence on renal dialysis: Secondary | ICD-10-CM | POA: Diagnosis not present

## 2015-01-27 DIAGNOSIS — N186 End stage renal disease: Secondary | ICD-10-CM | POA: Diagnosis not present

## 2015-01-27 DIAGNOSIS — E10649 Type 1 diabetes mellitus with hypoglycemia without coma: Secondary | ICD-10-CM | POA: Diagnosis not present

## 2015-01-30 DIAGNOSIS — N186 End stage renal disease: Secondary | ICD-10-CM | POA: Diagnosis not present

## 2015-01-30 DIAGNOSIS — D509 Iron deficiency anemia, unspecified: Secondary | ICD-10-CM | POA: Diagnosis not present

## 2015-01-30 DIAGNOSIS — N2581 Secondary hyperparathyroidism of renal origin: Secondary | ICD-10-CM | POA: Diagnosis not present

## 2015-01-30 DIAGNOSIS — E10649 Type 1 diabetes mellitus with hypoglycemia without coma: Secondary | ICD-10-CM | POA: Diagnosis not present

## 2015-01-30 DIAGNOSIS — Z992 Dependence on renal dialysis: Secondary | ICD-10-CM | POA: Diagnosis not present

## 2015-01-30 DIAGNOSIS — E162 Hypoglycemia, unspecified: Secondary | ICD-10-CM | POA: Diagnosis not present

## 2015-01-31 DIAGNOSIS — H40003 Preglaucoma, unspecified, bilateral: Secondary | ICD-10-CM | POA: Diagnosis not present

## 2015-01-31 DIAGNOSIS — H2640 Unspecified secondary cataract: Secondary | ICD-10-CM | POA: Diagnosis not present

## 2015-02-01 DIAGNOSIS — E10649 Type 1 diabetes mellitus with hypoglycemia without coma: Secondary | ICD-10-CM | POA: Diagnosis not present

## 2015-02-01 DIAGNOSIS — N186 End stage renal disease: Secondary | ICD-10-CM | POA: Diagnosis not present

## 2015-02-01 DIAGNOSIS — N2581 Secondary hyperparathyroidism of renal origin: Secondary | ICD-10-CM | POA: Diagnosis not present

## 2015-02-01 DIAGNOSIS — D509 Iron deficiency anemia, unspecified: Secondary | ICD-10-CM | POA: Diagnosis not present

## 2015-02-01 DIAGNOSIS — E162 Hypoglycemia, unspecified: Secondary | ICD-10-CM | POA: Diagnosis not present

## 2015-02-01 DIAGNOSIS — Z992 Dependence on renal dialysis: Secondary | ICD-10-CM | POA: Diagnosis not present

## 2015-02-03 DIAGNOSIS — N2581 Secondary hyperparathyroidism of renal origin: Secondary | ICD-10-CM | POA: Diagnosis not present

## 2015-02-03 DIAGNOSIS — D509 Iron deficiency anemia, unspecified: Secondary | ICD-10-CM | POA: Diagnosis not present

## 2015-02-03 DIAGNOSIS — Z992 Dependence on renal dialysis: Secondary | ICD-10-CM | POA: Diagnosis not present

## 2015-02-03 DIAGNOSIS — N186 End stage renal disease: Secondary | ICD-10-CM | POA: Diagnosis not present

## 2015-02-03 DIAGNOSIS — E162 Hypoglycemia, unspecified: Secondary | ICD-10-CM | POA: Diagnosis not present

## 2015-02-03 DIAGNOSIS — E10649 Type 1 diabetes mellitus with hypoglycemia without coma: Secondary | ICD-10-CM | POA: Diagnosis not present

## 2015-02-05 DIAGNOSIS — D509 Iron deficiency anemia, unspecified: Secondary | ICD-10-CM | POA: Diagnosis not present

## 2015-02-05 DIAGNOSIS — Z992 Dependence on renal dialysis: Secondary | ICD-10-CM | POA: Diagnosis not present

## 2015-02-05 DIAGNOSIS — E162 Hypoglycemia, unspecified: Secondary | ICD-10-CM | POA: Diagnosis not present

## 2015-02-05 DIAGNOSIS — N2581 Secondary hyperparathyroidism of renal origin: Secondary | ICD-10-CM | POA: Diagnosis not present

## 2015-02-05 DIAGNOSIS — N186 End stage renal disease: Secondary | ICD-10-CM | POA: Diagnosis not present

## 2015-02-05 DIAGNOSIS — E10649 Type 1 diabetes mellitus with hypoglycemia without coma: Secondary | ICD-10-CM | POA: Diagnosis not present

## 2015-02-07 DIAGNOSIS — E162 Hypoglycemia, unspecified: Secondary | ICD-10-CM | POA: Diagnosis not present

## 2015-02-07 DIAGNOSIS — E10649 Type 1 diabetes mellitus with hypoglycemia without coma: Secondary | ICD-10-CM | POA: Diagnosis not present

## 2015-02-07 DIAGNOSIS — Z992 Dependence on renal dialysis: Secondary | ICD-10-CM | POA: Diagnosis not present

## 2015-02-07 DIAGNOSIS — N2581 Secondary hyperparathyroidism of renal origin: Secondary | ICD-10-CM | POA: Diagnosis not present

## 2015-02-07 DIAGNOSIS — D509 Iron deficiency anemia, unspecified: Secondary | ICD-10-CM | POA: Diagnosis not present

## 2015-02-07 DIAGNOSIS — N186 End stage renal disease: Secondary | ICD-10-CM | POA: Diagnosis not present

## 2015-02-10 DIAGNOSIS — Z992 Dependence on renal dialysis: Secondary | ICD-10-CM | POA: Diagnosis not present

## 2015-02-10 DIAGNOSIS — N2581 Secondary hyperparathyroidism of renal origin: Secondary | ICD-10-CM | POA: Diagnosis not present

## 2015-02-10 DIAGNOSIS — D509 Iron deficiency anemia, unspecified: Secondary | ICD-10-CM | POA: Diagnosis not present

## 2015-02-10 DIAGNOSIS — E1129 Type 2 diabetes mellitus with other diabetic kidney complication: Secondary | ICD-10-CM | POA: Diagnosis not present

## 2015-02-10 DIAGNOSIS — N186 End stage renal disease: Secondary | ICD-10-CM | POA: Diagnosis not present

## 2015-02-13 DIAGNOSIS — D509 Iron deficiency anemia, unspecified: Secondary | ICD-10-CM | POA: Diagnosis not present

## 2015-02-13 DIAGNOSIS — N186 End stage renal disease: Secondary | ICD-10-CM | POA: Diagnosis not present

## 2015-02-13 DIAGNOSIS — N2581 Secondary hyperparathyroidism of renal origin: Secondary | ICD-10-CM | POA: Diagnosis not present

## 2015-02-15 DIAGNOSIS — D509 Iron deficiency anemia, unspecified: Secondary | ICD-10-CM | POA: Diagnosis not present

## 2015-02-15 DIAGNOSIS — N186 End stage renal disease: Secondary | ICD-10-CM | POA: Diagnosis not present

## 2015-02-15 DIAGNOSIS — Z992 Dependence on renal dialysis: Secondary | ICD-10-CM | POA: Diagnosis not present

## 2015-02-15 DIAGNOSIS — Z4931 Encounter for adequacy testing for hemodialysis: Secondary | ICD-10-CM | POA: Diagnosis not present

## 2015-02-15 DIAGNOSIS — E1122 Type 2 diabetes mellitus with diabetic chronic kidney disease: Secondary | ICD-10-CM | POA: Diagnosis not present

## 2015-02-15 DIAGNOSIS — N2581 Secondary hyperparathyroidism of renal origin: Secondary | ICD-10-CM | POA: Diagnosis not present

## 2015-02-16 DIAGNOSIS — Z992 Dependence on renal dialysis: Secondary | ICD-10-CM | POA: Diagnosis not present

## 2015-02-16 DIAGNOSIS — N186 End stage renal disease: Secondary | ICD-10-CM | POA: Diagnosis not present

## 2015-02-16 DIAGNOSIS — E1129 Type 2 diabetes mellitus with other diabetic kidney complication: Secondary | ICD-10-CM | POA: Diagnosis not present

## 2015-02-17 DIAGNOSIS — N2581 Secondary hyperparathyroidism of renal origin: Secondary | ICD-10-CM | POA: Diagnosis not present

## 2015-02-17 DIAGNOSIS — D509 Iron deficiency anemia, unspecified: Secondary | ICD-10-CM | POA: Diagnosis not present

## 2015-02-17 DIAGNOSIS — N186 End stage renal disease: Secondary | ICD-10-CM | POA: Diagnosis not present

## 2015-02-20 DIAGNOSIS — D509 Iron deficiency anemia, unspecified: Secondary | ICD-10-CM | POA: Diagnosis not present

## 2015-02-20 DIAGNOSIS — N2581 Secondary hyperparathyroidism of renal origin: Secondary | ICD-10-CM | POA: Diagnosis not present

## 2015-02-20 DIAGNOSIS — N186 End stage renal disease: Secondary | ICD-10-CM | POA: Diagnosis not present

## 2015-02-22 DIAGNOSIS — N186 End stage renal disease: Secondary | ICD-10-CM | POA: Diagnosis not present

## 2015-02-22 DIAGNOSIS — N2581 Secondary hyperparathyroidism of renal origin: Secondary | ICD-10-CM | POA: Diagnosis not present

## 2015-02-22 DIAGNOSIS — D509 Iron deficiency anemia, unspecified: Secondary | ICD-10-CM | POA: Diagnosis not present

## 2015-02-24 DIAGNOSIS — D509 Iron deficiency anemia, unspecified: Secondary | ICD-10-CM | POA: Diagnosis not present

## 2015-02-24 DIAGNOSIS — N186 End stage renal disease: Secondary | ICD-10-CM | POA: Diagnosis not present

## 2015-02-24 DIAGNOSIS — N2581 Secondary hyperparathyroidism of renal origin: Secondary | ICD-10-CM | POA: Diagnosis not present

## 2015-02-27 DIAGNOSIS — E162 Hypoglycemia, unspecified: Secondary | ICD-10-CM | POA: Diagnosis not present

## 2015-02-27 DIAGNOSIS — Z794 Long term (current) use of insulin: Secondary | ICD-10-CM | POA: Diagnosis not present

## 2015-02-27 DIAGNOSIS — Z992 Dependence on renal dialysis: Secondary | ICD-10-CM | POA: Diagnosis not present

## 2015-02-27 DIAGNOSIS — E10649 Type 1 diabetes mellitus with hypoglycemia without coma: Secondary | ICD-10-CM | POA: Diagnosis not present

## 2015-02-27 DIAGNOSIS — N186 End stage renal disease: Secondary | ICD-10-CM | POA: Diagnosis not present

## 2015-02-27 DIAGNOSIS — D509 Iron deficiency anemia, unspecified: Secondary | ICD-10-CM | POA: Diagnosis not present

## 2015-02-27 DIAGNOSIS — N2581 Secondary hyperparathyroidism of renal origin: Secondary | ICD-10-CM | POA: Diagnosis not present

## 2015-02-28 DIAGNOSIS — K529 Noninfective gastroenteritis and colitis, unspecified: Secondary | ICD-10-CM | POA: Diagnosis not present

## 2015-03-01 DIAGNOSIS — Z992 Dependence on renal dialysis: Secondary | ICD-10-CM | POA: Diagnosis not present

## 2015-03-01 DIAGNOSIS — N2581 Secondary hyperparathyroidism of renal origin: Secondary | ICD-10-CM | POA: Diagnosis not present

## 2015-03-01 DIAGNOSIS — E10649 Type 1 diabetes mellitus with hypoglycemia without coma: Secondary | ICD-10-CM | POA: Diagnosis not present

## 2015-03-01 DIAGNOSIS — E162 Hypoglycemia, unspecified: Secondary | ICD-10-CM | POA: Diagnosis not present

## 2015-03-01 DIAGNOSIS — N186 End stage renal disease: Secondary | ICD-10-CM | POA: Diagnosis not present

## 2015-03-01 DIAGNOSIS — D509 Iron deficiency anemia, unspecified: Secondary | ICD-10-CM | POA: Diagnosis not present

## 2015-03-03 DIAGNOSIS — Z992 Dependence on renal dialysis: Secondary | ICD-10-CM | POA: Diagnosis not present

## 2015-03-03 DIAGNOSIS — N2581 Secondary hyperparathyroidism of renal origin: Secondary | ICD-10-CM | POA: Diagnosis not present

## 2015-03-03 DIAGNOSIS — E10649 Type 1 diabetes mellitus with hypoglycemia without coma: Secondary | ICD-10-CM | POA: Diagnosis not present

## 2015-03-03 DIAGNOSIS — N186 End stage renal disease: Secondary | ICD-10-CM | POA: Diagnosis not present

## 2015-03-03 DIAGNOSIS — E162 Hypoglycemia, unspecified: Secondary | ICD-10-CM | POA: Diagnosis not present

## 2015-03-03 DIAGNOSIS — D509 Iron deficiency anemia, unspecified: Secondary | ICD-10-CM | POA: Diagnosis not present

## 2015-03-06 DIAGNOSIS — E10649 Type 1 diabetes mellitus with hypoglycemia without coma: Secondary | ICD-10-CM | POA: Diagnosis not present

## 2015-03-06 DIAGNOSIS — E162 Hypoglycemia, unspecified: Secondary | ICD-10-CM | POA: Diagnosis not present

## 2015-03-06 DIAGNOSIS — N186 End stage renal disease: Secondary | ICD-10-CM | POA: Diagnosis not present

## 2015-03-06 DIAGNOSIS — D509 Iron deficiency anemia, unspecified: Secondary | ICD-10-CM | POA: Diagnosis not present

## 2015-03-06 DIAGNOSIS — N2581 Secondary hyperparathyroidism of renal origin: Secondary | ICD-10-CM | POA: Diagnosis not present

## 2015-03-06 DIAGNOSIS — Z992 Dependence on renal dialysis: Secondary | ICD-10-CM | POA: Diagnosis not present

## 2015-03-08 DIAGNOSIS — N186 End stage renal disease: Secondary | ICD-10-CM | POA: Diagnosis not present

## 2015-03-08 DIAGNOSIS — E10649 Type 1 diabetes mellitus with hypoglycemia without coma: Secondary | ICD-10-CM | POA: Diagnosis not present

## 2015-03-08 DIAGNOSIS — N2581 Secondary hyperparathyroidism of renal origin: Secondary | ICD-10-CM | POA: Diagnosis not present

## 2015-03-08 DIAGNOSIS — E162 Hypoglycemia, unspecified: Secondary | ICD-10-CM | POA: Diagnosis not present

## 2015-03-08 DIAGNOSIS — Z992 Dependence on renal dialysis: Secondary | ICD-10-CM | POA: Diagnosis not present

## 2015-03-08 DIAGNOSIS — D509 Iron deficiency anemia, unspecified: Secondary | ICD-10-CM | POA: Diagnosis not present

## 2015-03-10 DIAGNOSIS — Z992 Dependence on renal dialysis: Secondary | ICD-10-CM | POA: Diagnosis not present

## 2015-03-10 DIAGNOSIS — N186 End stage renal disease: Secondary | ICD-10-CM | POA: Diagnosis not present

## 2015-03-10 DIAGNOSIS — E162 Hypoglycemia, unspecified: Secondary | ICD-10-CM | POA: Diagnosis not present

## 2015-03-10 DIAGNOSIS — N2581 Secondary hyperparathyroidism of renal origin: Secondary | ICD-10-CM | POA: Diagnosis not present

## 2015-03-10 DIAGNOSIS — D509 Iron deficiency anemia, unspecified: Secondary | ICD-10-CM | POA: Diagnosis not present

## 2015-03-10 DIAGNOSIS — E10649 Type 1 diabetes mellitus with hypoglycemia without coma: Secondary | ICD-10-CM | POA: Diagnosis not present

## 2015-03-13 DIAGNOSIS — N186 End stage renal disease: Secondary | ICD-10-CM | POA: Diagnosis not present

## 2015-03-13 DIAGNOSIS — D509 Iron deficiency anemia, unspecified: Secondary | ICD-10-CM | POA: Diagnosis not present

## 2015-03-13 DIAGNOSIS — E10649 Type 1 diabetes mellitus with hypoglycemia without coma: Secondary | ICD-10-CM | POA: Diagnosis not present

## 2015-03-13 DIAGNOSIS — E162 Hypoglycemia, unspecified: Secondary | ICD-10-CM | POA: Diagnosis not present

## 2015-03-13 DIAGNOSIS — N2581 Secondary hyperparathyroidism of renal origin: Secondary | ICD-10-CM | POA: Diagnosis not present

## 2015-03-13 DIAGNOSIS — Z992 Dependence on renal dialysis: Secondary | ICD-10-CM | POA: Diagnosis not present

## 2015-03-15 DIAGNOSIS — E162 Hypoglycemia, unspecified: Secondary | ICD-10-CM | POA: Diagnosis not present

## 2015-03-15 DIAGNOSIS — D509 Iron deficiency anemia, unspecified: Secondary | ICD-10-CM | POA: Diagnosis not present

## 2015-03-15 DIAGNOSIS — N186 End stage renal disease: Secondary | ICD-10-CM | POA: Diagnosis not present

## 2015-03-15 DIAGNOSIS — N2581 Secondary hyperparathyroidism of renal origin: Secondary | ICD-10-CM | POA: Diagnosis not present

## 2015-03-15 DIAGNOSIS — Z992 Dependence on renal dialysis: Secondary | ICD-10-CM | POA: Diagnosis not present

## 2015-03-15 DIAGNOSIS — E10649 Type 1 diabetes mellitus with hypoglycemia without coma: Secondary | ICD-10-CM | POA: Diagnosis not present

## 2015-03-17 DIAGNOSIS — N186 End stage renal disease: Secondary | ICD-10-CM | POA: Diagnosis not present

## 2015-03-17 DIAGNOSIS — Z992 Dependence on renal dialysis: Secondary | ICD-10-CM | POA: Diagnosis not present

## 2015-03-17 DIAGNOSIS — E162 Hypoglycemia, unspecified: Secondary | ICD-10-CM | POA: Diagnosis not present

## 2015-03-17 DIAGNOSIS — E10649 Type 1 diabetes mellitus with hypoglycemia without coma: Secondary | ICD-10-CM | POA: Diagnosis not present

## 2015-03-17 DIAGNOSIS — D509 Iron deficiency anemia, unspecified: Secondary | ICD-10-CM | POA: Diagnosis not present

## 2015-03-17 DIAGNOSIS — N2581 Secondary hyperparathyroidism of renal origin: Secondary | ICD-10-CM | POA: Diagnosis not present

## 2015-03-18 DIAGNOSIS — N186 End stage renal disease: Secondary | ICD-10-CM | POA: Diagnosis not present

## 2015-03-18 DIAGNOSIS — Z4931 Encounter for adequacy testing for hemodialysis: Secondary | ICD-10-CM | POA: Diagnosis not present

## 2015-03-18 DIAGNOSIS — E1122 Type 2 diabetes mellitus with diabetic chronic kidney disease: Secondary | ICD-10-CM | POA: Diagnosis not present

## 2015-03-18 DIAGNOSIS — Z992 Dependence on renal dialysis: Secondary | ICD-10-CM | POA: Diagnosis not present

## 2015-03-20 DIAGNOSIS — D509 Iron deficiency anemia, unspecified: Secondary | ICD-10-CM | POA: Diagnosis not present

## 2015-03-20 DIAGNOSIS — N186 End stage renal disease: Secondary | ICD-10-CM | POA: Diagnosis not present

## 2015-03-20 DIAGNOSIS — E10649 Type 1 diabetes mellitus with hypoglycemia without coma: Secondary | ICD-10-CM | POA: Diagnosis not present

## 2015-03-20 DIAGNOSIS — Z992 Dependence on renal dialysis: Secondary | ICD-10-CM | POA: Diagnosis not present

## 2015-03-20 DIAGNOSIS — E162 Hypoglycemia, unspecified: Secondary | ICD-10-CM | POA: Diagnosis not present

## 2015-03-20 DIAGNOSIS — Z794 Long term (current) use of insulin: Secondary | ICD-10-CM | POA: Diagnosis not present

## 2015-03-20 DIAGNOSIS — N2581 Secondary hyperparathyroidism of renal origin: Secondary | ICD-10-CM | POA: Diagnosis not present

## 2015-03-21 DIAGNOSIS — H2640 Unspecified secondary cataract: Secondary | ICD-10-CM | POA: Diagnosis not present

## 2015-03-22 DIAGNOSIS — E10649 Type 1 diabetes mellitus with hypoglycemia without coma: Secondary | ICD-10-CM | POA: Diagnosis not present

## 2015-03-22 DIAGNOSIS — E162 Hypoglycemia, unspecified: Secondary | ICD-10-CM | POA: Diagnosis not present

## 2015-03-22 DIAGNOSIS — N186 End stage renal disease: Secondary | ICD-10-CM | POA: Diagnosis not present

## 2015-03-22 DIAGNOSIS — D509 Iron deficiency anemia, unspecified: Secondary | ICD-10-CM | POA: Diagnosis not present

## 2015-03-22 DIAGNOSIS — N2581 Secondary hyperparathyroidism of renal origin: Secondary | ICD-10-CM | POA: Diagnosis not present

## 2015-03-22 DIAGNOSIS — Z992 Dependence on renal dialysis: Secondary | ICD-10-CM | POA: Diagnosis not present

## 2015-03-24 DIAGNOSIS — N2581 Secondary hyperparathyroidism of renal origin: Secondary | ICD-10-CM | POA: Diagnosis not present

## 2015-03-24 DIAGNOSIS — D509 Iron deficiency anemia, unspecified: Secondary | ICD-10-CM | POA: Diagnosis not present

## 2015-03-24 DIAGNOSIS — N186 End stage renal disease: Secondary | ICD-10-CM | POA: Diagnosis not present

## 2015-03-24 DIAGNOSIS — E162 Hypoglycemia, unspecified: Secondary | ICD-10-CM | POA: Diagnosis not present

## 2015-03-24 DIAGNOSIS — E10649 Type 1 diabetes mellitus with hypoglycemia without coma: Secondary | ICD-10-CM | POA: Diagnosis not present

## 2015-03-24 DIAGNOSIS — Z992 Dependence on renal dialysis: Secondary | ICD-10-CM | POA: Diagnosis not present

## 2015-03-27 DIAGNOSIS — D509 Iron deficiency anemia, unspecified: Secondary | ICD-10-CM | POA: Diagnosis not present

## 2015-03-27 DIAGNOSIS — E10649 Type 1 diabetes mellitus with hypoglycemia without coma: Secondary | ICD-10-CM | POA: Diagnosis not present

## 2015-03-27 DIAGNOSIS — Z992 Dependence on renal dialysis: Secondary | ICD-10-CM | POA: Diagnosis not present

## 2015-03-27 DIAGNOSIS — E162 Hypoglycemia, unspecified: Secondary | ICD-10-CM | POA: Diagnosis not present

## 2015-03-27 DIAGNOSIS — N186 End stage renal disease: Secondary | ICD-10-CM | POA: Diagnosis not present

## 2015-03-27 DIAGNOSIS — N2581 Secondary hyperparathyroidism of renal origin: Secondary | ICD-10-CM | POA: Diagnosis not present

## 2015-03-28 DIAGNOSIS — N186 End stage renal disease: Secondary | ICD-10-CM | POA: Diagnosis not present

## 2015-03-28 DIAGNOSIS — H2640 Unspecified secondary cataract: Secondary | ICD-10-CM | POA: Diagnosis not present

## 2015-03-29 DIAGNOSIS — D509 Iron deficiency anemia, unspecified: Secondary | ICD-10-CM | POA: Diagnosis not present

## 2015-03-29 DIAGNOSIS — N2581 Secondary hyperparathyroidism of renal origin: Secondary | ICD-10-CM | POA: Diagnosis not present

## 2015-03-29 DIAGNOSIS — E10649 Type 1 diabetes mellitus with hypoglycemia without coma: Secondary | ICD-10-CM | POA: Diagnosis not present

## 2015-03-29 DIAGNOSIS — E162 Hypoglycemia, unspecified: Secondary | ICD-10-CM | POA: Diagnosis not present

## 2015-03-29 DIAGNOSIS — Z992 Dependence on renal dialysis: Secondary | ICD-10-CM | POA: Diagnosis not present

## 2015-03-29 DIAGNOSIS — N186 End stage renal disease: Secondary | ICD-10-CM | POA: Diagnosis not present

## 2015-03-31 DIAGNOSIS — N186 End stage renal disease: Secondary | ICD-10-CM | POA: Diagnosis not present

## 2015-03-31 DIAGNOSIS — E162 Hypoglycemia, unspecified: Secondary | ICD-10-CM | POA: Diagnosis not present

## 2015-03-31 DIAGNOSIS — D509 Iron deficiency anemia, unspecified: Secondary | ICD-10-CM | POA: Diagnosis not present

## 2015-03-31 DIAGNOSIS — Z992 Dependence on renal dialysis: Secondary | ICD-10-CM | POA: Diagnosis not present

## 2015-03-31 DIAGNOSIS — N2581 Secondary hyperparathyroidism of renal origin: Secondary | ICD-10-CM | POA: Diagnosis not present

## 2015-03-31 DIAGNOSIS — E10649 Type 1 diabetes mellitus with hypoglycemia without coma: Secondary | ICD-10-CM | POA: Diagnosis not present

## 2015-04-03 DIAGNOSIS — N2581 Secondary hyperparathyroidism of renal origin: Secondary | ICD-10-CM | POA: Diagnosis not present

## 2015-04-03 DIAGNOSIS — N186 End stage renal disease: Secondary | ICD-10-CM | POA: Diagnosis not present

## 2015-04-03 DIAGNOSIS — E162 Hypoglycemia, unspecified: Secondary | ICD-10-CM | POA: Diagnosis not present

## 2015-04-03 DIAGNOSIS — D509 Iron deficiency anemia, unspecified: Secondary | ICD-10-CM | POA: Diagnosis not present

## 2015-04-03 DIAGNOSIS — E10649 Type 1 diabetes mellitus with hypoglycemia without coma: Secondary | ICD-10-CM | POA: Diagnosis not present

## 2015-04-03 DIAGNOSIS — Z992 Dependence on renal dialysis: Secondary | ICD-10-CM | POA: Diagnosis not present

## 2015-04-05 DIAGNOSIS — Z992 Dependence on renal dialysis: Secondary | ICD-10-CM | POA: Diagnosis not present

## 2015-04-05 DIAGNOSIS — N2581 Secondary hyperparathyroidism of renal origin: Secondary | ICD-10-CM | POA: Diagnosis not present

## 2015-04-05 DIAGNOSIS — E10649 Type 1 diabetes mellitus with hypoglycemia without coma: Secondary | ICD-10-CM | POA: Diagnosis not present

## 2015-04-05 DIAGNOSIS — N186 End stage renal disease: Secondary | ICD-10-CM | POA: Diagnosis not present

## 2015-04-05 DIAGNOSIS — D509 Iron deficiency anemia, unspecified: Secondary | ICD-10-CM | POA: Diagnosis not present

## 2015-04-05 DIAGNOSIS — E162 Hypoglycemia, unspecified: Secondary | ICD-10-CM | POA: Diagnosis not present

## 2015-04-07 DIAGNOSIS — N2581 Secondary hyperparathyroidism of renal origin: Secondary | ICD-10-CM | POA: Diagnosis not present

## 2015-04-07 DIAGNOSIS — Z992 Dependence on renal dialysis: Secondary | ICD-10-CM | POA: Diagnosis not present

## 2015-04-07 DIAGNOSIS — E10649 Type 1 diabetes mellitus with hypoglycemia without coma: Secondary | ICD-10-CM | POA: Diagnosis not present

## 2015-04-07 DIAGNOSIS — E162 Hypoglycemia, unspecified: Secondary | ICD-10-CM | POA: Diagnosis not present

## 2015-04-07 DIAGNOSIS — N186 End stage renal disease: Secondary | ICD-10-CM | POA: Diagnosis not present

## 2015-04-07 DIAGNOSIS — D509 Iron deficiency anemia, unspecified: Secondary | ICD-10-CM | POA: Diagnosis not present

## 2015-04-10 DIAGNOSIS — N2581 Secondary hyperparathyroidism of renal origin: Secondary | ICD-10-CM | POA: Diagnosis not present

## 2015-04-10 DIAGNOSIS — Z992 Dependence on renal dialysis: Secondary | ICD-10-CM | POA: Diagnosis not present

## 2015-04-10 DIAGNOSIS — E10649 Type 1 diabetes mellitus with hypoglycemia without coma: Secondary | ICD-10-CM | POA: Diagnosis not present

## 2015-04-10 DIAGNOSIS — D509 Iron deficiency anemia, unspecified: Secondary | ICD-10-CM | POA: Diagnosis not present

## 2015-04-10 DIAGNOSIS — E162 Hypoglycemia, unspecified: Secondary | ICD-10-CM | POA: Diagnosis not present

## 2015-04-10 DIAGNOSIS — N186 End stage renal disease: Secondary | ICD-10-CM | POA: Diagnosis not present

## 2015-04-12 DIAGNOSIS — E10649 Type 1 diabetes mellitus with hypoglycemia without coma: Secondary | ICD-10-CM | POA: Diagnosis not present

## 2015-04-12 DIAGNOSIS — D509 Iron deficiency anemia, unspecified: Secondary | ICD-10-CM | POA: Diagnosis not present

## 2015-04-12 DIAGNOSIS — N186 End stage renal disease: Secondary | ICD-10-CM | POA: Diagnosis not present

## 2015-04-12 DIAGNOSIS — N2581 Secondary hyperparathyroidism of renal origin: Secondary | ICD-10-CM | POA: Diagnosis not present

## 2015-04-12 DIAGNOSIS — Z992 Dependence on renal dialysis: Secondary | ICD-10-CM | POA: Diagnosis not present

## 2015-04-12 DIAGNOSIS — E162 Hypoglycemia, unspecified: Secondary | ICD-10-CM | POA: Diagnosis not present

## 2015-04-13 DIAGNOSIS — I480 Paroxysmal atrial fibrillation: Secondary | ICD-10-CM | POA: Diagnosis not present

## 2015-04-13 DIAGNOSIS — I351 Nonrheumatic aortic (valve) insufficiency: Secondary | ICD-10-CM | POA: Diagnosis not present

## 2015-04-13 DIAGNOSIS — I509 Heart failure, unspecified: Secondary | ICD-10-CM | POA: Diagnosis not present

## 2015-04-14 DIAGNOSIS — E10649 Type 1 diabetes mellitus with hypoglycemia without coma: Secondary | ICD-10-CM | POA: Diagnosis not present

## 2015-04-14 DIAGNOSIS — N186 End stage renal disease: Secondary | ICD-10-CM | POA: Diagnosis not present

## 2015-04-14 DIAGNOSIS — Z992 Dependence on renal dialysis: Secondary | ICD-10-CM | POA: Diagnosis not present

## 2015-04-14 DIAGNOSIS — N2581 Secondary hyperparathyroidism of renal origin: Secondary | ICD-10-CM | POA: Diagnosis not present

## 2015-04-14 DIAGNOSIS — E162 Hypoglycemia, unspecified: Secondary | ICD-10-CM | POA: Diagnosis not present

## 2015-04-14 DIAGNOSIS — D509 Iron deficiency anemia, unspecified: Secondary | ICD-10-CM | POA: Diagnosis not present

## 2015-04-17 DIAGNOSIS — J45909 Unspecified asthma, uncomplicated: Secondary | ICD-10-CM | POA: Diagnosis not present

## 2015-04-17 DIAGNOSIS — J449 Chronic obstructive pulmonary disease, unspecified: Secondary | ICD-10-CM | POA: Diagnosis not present

## 2015-04-17 DIAGNOSIS — E039 Hypothyroidism, unspecified: Secondary | ICD-10-CM | POA: Diagnosis not present

## 2015-04-17 DIAGNOSIS — Z885 Allergy status to narcotic agent status: Secondary | ICD-10-CM | POA: Diagnosis not present

## 2015-04-17 DIAGNOSIS — I351 Nonrheumatic aortic (valve) insufficiency: Secondary | ICD-10-CM | POA: Diagnosis not present

## 2015-04-17 DIAGNOSIS — Z7951 Long term (current) use of inhaled steroids: Secondary | ICD-10-CM | POA: Diagnosis not present

## 2015-04-17 DIAGNOSIS — D509 Iron deficiency anemia, unspecified: Secondary | ICD-10-CM | POA: Diagnosis not present

## 2015-04-17 DIAGNOSIS — Z992 Dependence on renal dialysis: Secondary | ICD-10-CM | POA: Diagnosis not present

## 2015-04-17 DIAGNOSIS — Z794 Long term (current) use of insulin: Secondary | ICD-10-CM | POA: Diagnosis not present

## 2015-04-17 DIAGNOSIS — Z79899 Other long term (current) drug therapy: Secondary | ICD-10-CM | POA: Diagnosis not present

## 2015-04-17 DIAGNOSIS — I4891 Unspecified atrial fibrillation: Secondary | ICD-10-CM | POA: Diagnosis not present

## 2015-04-17 DIAGNOSIS — N186 End stage renal disease: Secondary | ICD-10-CM | POA: Diagnosis not present

## 2015-04-17 DIAGNOSIS — Z888 Allergy status to other drugs, medicaments and biological substances status: Secondary | ICD-10-CM | POA: Diagnosis not present

## 2015-04-17 DIAGNOSIS — Z7982 Long term (current) use of aspirin: Secondary | ICD-10-CM | POA: Diagnosis not present

## 2015-04-17 DIAGNOSIS — I071 Rheumatic tricuspid insufficiency: Secondary | ICD-10-CM | POA: Diagnosis not present

## 2015-04-17 DIAGNOSIS — I770 Arteriovenous fistula, acquired: Secondary | ICD-10-CM | POA: Diagnosis not present

## 2015-04-17 DIAGNOSIS — E10649 Type 1 diabetes mellitus with hypoglycemia without coma: Secondary | ICD-10-CM | POA: Diagnosis not present

## 2015-04-17 DIAGNOSIS — Z8673 Personal history of transient ischemic attack (TIA), and cerebral infarction without residual deficits: Secondary | ICD-10-CM | POA: Diagnosis not present

## 2015-04-17 DIAGNOSIS — E1122 Type 2 diabetes mellitus with diabetic chronic kidney disease: Secondary | ICD-10-CM | POA: Diagnosis not present

## 2015-04-17 DIAGNOSIS — N2581 Secondary hyperparathyroidism of renal origin: Secondary | ICD-10-CM | POA: Diagnosis not present

## 2015-04-17 DIAGNOSIS — I120 Hypertensive chronic kidney disease with stage 5 chronic kidney disease or end stage renal disease: Secondary | ICD-10-CM | POA: Diagnosis not present

## 2015-04-17 DIAGNOSIS — E162 Hypoglycemia, unspecified: Secondary | ICD-10-CM | POA: Diagnosis not present

## 2015-04-17 DIAGNOSIS — I509 Heart failure, unspecified: Secondary | ICD-10-CM | POA: Diagnosis not present

## 2015-04-18 DIAGNOSIS — Z992 Dependence on renal dialysis: Secondary | ICD-10-CM | POA: Diagnosis not present

## 2015-04-18 DIAGNOSIS — N186 End stage renal disease: Secondary | ICD-10-CM | POA: Diagnosis not present

## 2015-04-18 DIAGNOSIS — E1122 Type 2 diabetes mellitus with diabetic chronic kidney disease: Secondary | ICD-10-CM | POA: Diagnosis not present

## 2015-04-18 DIAGNOSIS — Z4931 Encounter for adequacy testing for hemodialysis: Secondary | ICD-10-CM | POA: Diagnosis not present

## 2015-04-19 DIAGNOSIS — N2581 Secondary hyperparathyroidism of renal origin: Secondary | ICD-10-CM | POA: Diagnosis not present

## 2015-04-19 DIAGNOSIS — E10649 Type 1 diabetes mellitus with hypoglycemia without coma: Secondary | ICD-10-CM | POA: Diagnosis not present

## 2015-04-19 DIAGNOSIS — Z992 Dependence on renal dialysis: Secondary | ICD-10-CM | POA: Diagnosis not present

## 2015-04-19 DIAGNOSIS — E162 Hypoglycemia, unspecified: Secondary | ICD-10-CM | POA: Diagnosis not present

## 2015-04-19 DIAGNOSIS — Z794 Long term (current) use of insulin: Secondary | ICD-10-CM | POA: Diagnosis not present

## 2015-04-19 DIAGNOSIS — N186 End stage renal disease: Secondary | ICD-10-CM | POA: Diagnosis not present

## 2015-04-19 DIAGNOSIS — D509 Iron deficiency anemia, unspecified: Secondary | ICD-10-CM | POA: Diagnosis not present

## 2015-04-21 DIAGNOSIS — N2581 Secondary hyperparathyroidism of renal origin: Secondary | ICD-10-CM | POA: Diagnosis not present

## 2015-04-21 DIAGNOSIS — Z992 Dependence on renal dialysis: Secondary | ICD-10-CM | POA: Diagnosis not present

## 2015-04-21 DIAGNOSIS — D509 Iron deficiency anemia, unspecified: Secondary | ICD-10-CM | POA: Diagnosis not present

## 2015-04-21 DIAGNOSIS — E162 Hypoglycemia, unspecified: Secondary | ICD-10-CM | POA: Diagnosis not present

## 2015-04-21 DIAGNOSIS — N186 End stage renal disease: Secondary | ICD-10-CM | POA: Diagnosis not present

## 2015-04-21 DIAGNOSIS — E10649 Type 1 diabetes mellitus with hypoglycemia without coma: Secondary | ICD-10-CM | POA: Diagnosis not present

## 2015-04-24 DIAGNOSIS — N186 End stage renal disease: Secondary | ICD-10-CM | POA: Diagnosis not present

## 2015-04-24 DIAGNOSIS — E10649 Type 1 diabetes mellitus with hypoglycemia without coma: Secondary | ICD-10-CM | POA: Diagnosis not present

## 2015-04-24 DIAGNOSIS — N2581 Secondary hyperparathyroidism of renal origin: Secondary | ICD-10-CM | POA: Diagnosis not present

## 2015-04-24 DIAGNOSIS — E162 Hypoglycemia, unspecified: Secondary | ICD-10-CM | POA: Diagnosis not present

## 2015-04-24 DIAGNOSIS — D509 Iron deficiency anemia, unspecified: Secondary | ICD-10-CM | POA: Diagnosis not present

## 2015-04-24 DIAGNOSIS — Z992 Dependence on renal dialysis: Secondary | ICD-10-CM | POA: Diagnosis not present

## 2015-04-26 DIAGNOSIS — N2581 Secondary hyperparathyroidism of renal origin: Secondary | ICD-10-CM | POA: Diagnosis not present

## 2015-04-26 DIAGNOSIS — E10649 Type 1 diabetes mellitus with hypoglycemia without coma: Secondary | ICD-10-CM | POA: Diagnosis not present

## 2015-04-26 DIAGNOSIS — N186 End stage renal disease: Secondary | ICD-10-CM | POA: Diagnosis not present

## 2015-04-26 DIAGNOSIS — D509 Iron deficiency anemia, unspecified: Secondary | ICD-10-CM | POA: Diagnosis not present

## 2015-04-26 DIAGNOSIS — Z992 Dependence on renal dialysis: Secondary | ICD-10-CM | POA: Diagnosis not present

## 2015-04-26 DIAGNOSIS — E162 Hypoglycemia, unspecified: Secondary | ICD-10-CM | POA: Diagnosis not present

## 2015-04-28 DIAGNOSIS — N2581 Secondary hyperparathyroidism of renal origin: Secondary | ICD-10-CM | POA: Diagnosis not present

## 2015-04-28 DIAGNOSIS — N186 End stage renal disease: Secondary | ICD-10-CM | POA: Diagnosis not present

## 2015-04-28 DIAGNOSIS — E10649 Type 1 diabetes mellitus with hypoglycemia without coma: Secondary | ICD-10-CM | POA: Diagnosis not present

## 2015-04-28 DIAGNOSIS — Z992 Dependence on renal dialysis: Secondary | ICD-10-CM | POA: Diagnosis not present

## 2015-04-28 DIAGNOSIS — E162 Hypoglycemia, unspecified: Secondary | ICD-10-CM | POA: Diagnosis not present

## 2015-04-28 DIAGNOSIS — D509 Iron deficiency anemia, unspecified: Secondary | ICD-10-CM | POA: Diagnosis not present

## 2015-05-01 DIAGNOSIS — Z992 Dependence on renal dialysis: Secondary | ICD-10-CM | POA: Diagnosis not present

## 2015-05-01 DIAGNOSIS — E10649 Type 1 diabetes mellitus with hypoglycemia without coma: Secondary | ICD-10-CM | POA: Diagnosis not present

## 2015-05-01 DIAGNOSIS — D509 Iron deficiency anemia, unspecified: Secondary | ICD-10-CM | POA: Diagnosis not present

## 2015-05-01 DIAGNOSIS — N186 End stage renal disease: Secondary | ICD-10-CM | POA: Diagnosis not present

## 2015-05-01 DIAGNOSIS — N2581 Secondary hyperparathyroidism of renal origin: Secondary | ICD-10-CM | POA: Diagnosis not present

## 2015-05-01 DIAGNOSIS — E162 Hypoglycemia, unspecified: Secondary | ICD-10-CM | POA: Diagnosis not present

## 2015-05-02 DIAGNOSIS — N186 End stage renal disease: Secondary | ICD-10-CM | POA: Diagnosis not present

## 2015-05-03 DIAGNOSIS — N2581 Secondary hyperparathyroidism of renal origin: Secondary | ICD-10-CM | POA: Diagnosis not present

## 2015-05-03 DIAGNOSIS — Z992 Dependence on renal dialysis: Secondary | ICD-10-CM | POA: Diagnosis not present

## 2015-05-03 DIAGNOSIS — E10649 Type 1 diabetes mellitus with hypoglycemia without coma: Secondary | ICD-10-CM | POA: Diagnosis not present

## 2015-05-03 DIAGNOSIS — E162 Hypoglycemia, unspecified: Secondary | ICD-10-CM | POA: Diagnosis not present

## 2015-05-03 DIAGNOSIS — N186 End stage renal disease: Secondary | ICD-10-CM | POA: Diagnosis not present

## 2015-05-03 DIAGNOSIS — D509 Iron deficiency anemia, unspecified: Secondary | ICD-10-CM | POA: Diagnosis not present

## 2015-05-05 DIAGNOSIS — N186 End stage renal disease: Secondary | ICD-10-CM | POA: Diagnosis not present

## 2015-05-05 DIAGNOSIS — D509 Iron deficiency anemia, unspecified: Secondary | ICD-10-CM | POA: Diagnosis not present

## 2015-05-05 DIAGNOSIS — E10649 Type 1 diabetes mellitus with hypoglycemia without coma: Secondary | ICD-10-CM | POA: Diagnosis not present

## 2015-05-05 DIAGNOSIS — Z992 Dependence on renal dialysis: Secondary | ICD-10-CM | POA: Diagnosis not present

## 2015-05-05 DIAGNOSIS — E162 Hypoglycemia, unspecified: Secondary | ICD-10-CM | POA: Diagnosis not present

## 2015-05-05 DIAGNOSIS — N2581 Secondary hyperparathyroidism of renal origin: Secondary | ICD-10-CM | POA: Diagnosis not present

## 2015-05-08 DIAGNOSIS — D509 Iron deficiency anemia, unspecified: Secondary | ICD-10-CM | POA: Diagnosis not present

## 2015-05-08 DIAGNOSIS — N2581 Secondary hyperparathyroidism of renal origin: Secondary | ICD-10-CM | POA: Diagnosis not present

## 2015-05-08 DIAGNOSIS — E10649 Type 1 diabetes mellitus with hypoglycemia without coma: Secondary | ICD-10-CM | POA: Diagnosis not present

## 2015-05-08 DIAGNOSIS — N186 End stage renal disease: Secondary | ICD-10-CM | POA: Diagnosis not present

## 2015-05-08 DIAGNOSIS — Z992 Dependence on renal dialysis: Secondary | ICD-10-CM | POA: Diagnosis not present

## 2015-05-08 DIAGNOSIS — E119 Type 2 diabetes mellitus without complications: Secondary | ICD-10-CM | POA: Diagnosis not present

## 2015-05-08 DIAGNOSIS — E162 Hypoglycemia, unspecified: Secondary | ICD-10-CM | POA: Diagnosis not present

## 2015-05-08 DIAGNOSIS — Z794 Long term (current) use of insulin: Secondary | ICD-10-CM | POA: Diagnosis not present

## 2015-05-10 DIAGNOSIS — E10649 Type 1 diabetes mellitus with hypoglycemia without coma: Secondary | ICD-10-CM | POA: Diagnosis not present

## 2015-05-10 DIAGNOSIS — D509 Iron deficiency anemia, unspecified: Secondary | ICD-10-CM | POA: Diagnosis not present

## 2015-05-10 DIAGNOSIS — Z992 Dependence on renal dialysis: Secondary | ICD-10-CM | POA: Diagnosis not present

## 2015-05-10 DIAGNOSIS — N186 End stage renal disease: Secondary | ICD-10-CM | POA: Diagnosis not present

## 2015-05-10 DIAGNOSIS — N2581 Secondary hyperparathyroidism of renal origin: Secondary | ICD-10-CM | POA: Diagnosis not present

## 2015-05-10 DIAGNOSIS — E162 Hypoglycemia, unspecified: Secondary | ICD-10-CM | POA: Diagnosis not present

## 2015-05-12 DIAGNOSIS — E10649 Type 1 diabetes mellitus with hypoglycemia without coma: Secondary | ICD-10-CM | POA: Diagnosis not present

## 2015-05-12 DIAGNOSIS — D509 Iron deficiency anemia, unspecified: Secondary | ICD-10-CM | POA: Diagnosis not present

## 2015-05-12 DIAGNOSIS — Z992 Dependence on renal dialysis: Secondary | ICD-10-CM | POA: Diagnosis not present

## 2015-05-12 DIAGNOSIS — N2581 Secondary hyperparathyroidism of renal origin: Secondary | ICD-10-CM | POA: Diagnosis not present

## 2015-05-12 DIAGNOSIS — N186 End stage renal disease: Secondary | ICD-10-CM | POA: Diagnosis not present

## 2015-05-12 DIAGNOSIS — E162 Hypoglycemia, unspecified: Secondary | ICD-10-CM | POA: Diagnosis not present

## 2015-05-15 DIAGNOSIS — Z992 Dependence on renal dialysis: Secondary | ICD-10-CM | POA: Diagnosis not present

## 2015-05-15 DIAGNOSIS — N2581 Secondary hyperparathyroidism of renal origin: Secondary | ICD-10-CM | POA: Diagnosis not present

## 2015-05-15 DIAGNOSIS — N186 End stage renal disease: Secondary | ICD-10-CM | POA: Diagnosis not present

## 2015-05-15 DIAGNOSIS — D509 Iron deficiency anemia, unspecified: Secondary | ICD-10-CM | POA: Diagnosis not present

## 2015-05-15 DIAGNOSIS — E162 Hypoglycemia, unspecified: Secondary | ICD-10-CM | POA: Diagnosis not present

## 2015-05-15 DIAGNOSIS — E10649 Type 1 diabetes mellitus with hypoglycemia without coma: Secondary | ICD-10-CM | POA: Diagnosis not present

## 2015-05-16 DIAGNOSIS — Z4931 Encounter for adequacy testing for hemodialysis: Secondary | ICD-10-CM | POA: Diagnosis not present

## 2015-05-16 DIAGNOSIS — E1122 Type 2 diabetes mellitus with diabetic chronic kidney disease: Secondary | ICD-10-CM | POA: Diagnosis not present

## 2015-05-16 DIAGNOSIS — Z992 Dependence on renal dialysis: Secondary | ICD-10-CM | POA: Diagnosis not present

## 2015-05-16 DIAGNOSIS — N186 End stage renal disease: Secondary | ICD-10-CM | POA: Diagnosis not present

## 2015-05-17 DIAGNOSIS — N2581 Secondary hyperparathyroidism of renal origin: Secondary | ICD-10-CM | POA: Diagnosis not present

## 2015-05-17 DIAGNOSIS — N186 End stage renal disease: Secondary | ICD-10-CM | POA: Diagnosis not present

## 2015-05-17 DIAGNOSIS — E162 Hypoglycemia, unspecified: Secondary | ICD-10-CM | POA: Diagnosis not present

## 2015-05-17 DIAGNOSIS — D509 Iron deficiency anemia, unspecified: Secondary | ICD-10-CM | POA: Diagnosis not present

## 2015-05-17 DIAGNOSIS — Z992 Dependence on renal dialysis: Secondary | ICD-10-CM | POA: Diagnosis not present

## 2015-05-17 DIAGNOSIS — E10649 Type 1 diabetes mellitus with hypoglycemia without coma: Secondary | ICD-10-CM | POA: Diagnosis not present

## 2015-05-17 DIAGNOSIS — Z794 Long term (current) use of insulin: Secondary | ICD-10-CM | POA: Diagnosis not present

## 2015-05-19 DIAGNOSIS — Z992 Dependence on renal dialysis: Secondary | ICD-10-CM | POA: Diagnosis not present

## 2015-05-19 DIAGNOSIS — N186 End stage renal disease: Secondary | ICD-10-CM | POA: Diagnosis not present

## 2015-05-19 DIAGNOSIS — D509 Iron deficiency anemia, unspecified: Secondary | ICD-10-CM | POA: Diagnosis not present

## 2015-05-19 DIAGNOSIS — N2581 Secondary hyperparathyroidism of renal origin: Secondary | ICD-10-CM | POA: Diagnosis not present

## 2015-05-19 DIAGNOSIS — E162 Hypoglycemia, unspecified: Secondary | ICD-10-CM | POA: Diagnosis not present

## 2015-05-19 DIAGNOSIS — E10649 Type 1 diabetes mellitus with hypoglycemia without coma: Secondary | ICD-10-CM | POA: Diagnosis not present

## 2015-05-22 DIAGNOSIS — N2581 Secondary hyperparathyroidism of renal origin: Secondary | ICD-10-CM | POA: Diagnosis not present

## 2015-05-22 DIAGNOSIS — E162 Hypoglycemia, unspecified: Secondary | ICD-10-CM | POA: Diagnosis not present

## 2015-05-22 DIAGNOSIS — E10649 Type 1 diabetes mellitus with hypoglycemia without coma: Secondary | ICD-10-CM | POA: Diagnosis not present

## 2015-05-22 DIAGNOSIS — D509 Iron deficiency anemia, unspecified: Secondary | ICD-10-CM | POA: Diagnosis not present

## 2015-05-22 DIAGNOSIS — N186 End stage renal disease: Secondary | ICD-10-CM | POA: Diagnosis not present

## 2015-05-22 DIAGNOSIS — Z992 Dependence on renal dialysis: Secondary | ICD-10-CM | POA: Diagnosis not present

## 2015-05-24 DIAGNOSIS — E162 Hypoglycemia, unspecified: Secondary | ICD-10-CM | POA: Diagnosis not present

## 2015-05-24 DIAGNOSIS — Z992 Dependence on renal dialysis: Secondary | ICD-10-CM | POA: Diagnosis not present

## 2015-05-24 DIAGNOSIS — N2581 Secondary hyperparathyroidism of renal origin: Secondary | ICD-10-CM | POA: Diagnosis not present

## 2015-05-24 DIAGNOSIS — D509 Iron deficiency anemia, unspecified: Secondary | ICD-10-CM | POA: Diagnosis not present

## 2015-05-24 DIAGNOSIS — N186 End stage renal disease: Secondary | ICD-10-CM | POA: Diagnosis not present

## 2015-05-24 DIAGNOSIS — E10649 Type 1 diabetes mellitus with hypoglycemia without coma: Secondary | ICD-10-CM | POA: Diagnosis not present

## 2015-05-26 DIAGNOSIS — N186 End stage renal disease: Secondary | ICD-10-CM | POA: Diagnosis not present

## 2015-05-26 DIAGNOSIS — Z992 Dependence on renal dialysis: Secondary | ICD-10-CM | POA: Diagnosis not present

## 2015-05-26 DIAGNOSIS — D509 Iron deficiency anemia, unspecified: Secondary | ICD-10-CM | POA: Diagnosis not present

## 2015-05-26 DIAGNOSIS — N2581 Secondary hyperparathyroidism of renal origin: Secondary | ICD-10-CM | POA: Diagnosis not present

## 2015-05-26 DIAGNOSIS — E10649 Type 1 diabetes mellitus with hypoglycemia without coma: Secondary | ICD-10-CM | POA: Diagnosis not present

## 2015-05-26 DIAGNOSIS — E162 Hypoglycemia, unspecified: Secondary | ICD-10-CM | POA: Diagnosis not present

## 2015-05-29 DIAGNOSIS — D509 Iron deficiency anemia, unspecified: Secondary | ICD-10-CM | POA: Diagnosis not present

## 2015-05-29 DIAGNOSIS — E10649 Type 1 diabetes mellitus with hypoglycemia without coma: Secondary | ICD-10-CM | POA: Diagnosis not present

## 2015-05-29 DIAGNOSIS — Z992 Dependence on renal dialysis: Secondary | ICD-10-CM | POA: Diagnosis not present

## 2015-05-29 DIAGNOSIS — N186 End stage renal disease: Secondary | ICD-10-CM | POA: Diagnosis not present

## 2015-05-29 DIAGNOSIS — E162 Hypoglycemia, unspecified: Secondary | ICD-10-CM | POA: Diagnosis not present

## 2015-05-29 DIAGNOSIS — N2581 Secondary hyperparathyroidism of renal origin: Secondary | ICD-10-CM | POA: Diagnosis not present

## 2015-05-31 DIAGNOSIS — Z992 Dependence on renal dialysis: Secondary | ICD-10-CM | POA: Diagnosis not present

## 2015-05-31 DIAGNOSIS — E162 Hypoglycemia, unspecified: Secondary | ICD-10-CM | POA: Diagnosis not present

## 2015-05-31 DIAGNOSIS — E10649 Type 1 diabetes mellitus with hypoglycemia without coma: Secondary | ICD-10-CM | POA: Diagnosis not present

## 2015-05-31 DIAGNOSIS — D509 Iron deficiency anemia, unspecified: Secondary | ICD-10-CM | POA: Diagnosis not present

## 2015-05-31 DIAGNOSIS — N2581 Secondary hyperparathyroidism of renal origin: Secondary | ICD-10-CM | POA: Diagnosis not present

## 2015-05-31 DIAGNOSIS — N186 End stage renal disease: Secondary | ICD-10-CM | POA: Diagnosis not present

## 2015-06-02 DIAGNOSIS — E162 Hypoglycemia, unspecified: Secondary | ICD-10-CM | POA: Diagnosis not present

## 2015-06-02 DIAGNOSIS — D509 Iron deficiency anemia, unspecified: Secondary | ICD-10-CM | POA: Diagnosis not present

## 2015-06-02 DIAGNOSIS — E10649 Type 1 diabetes mellitus with hypoglycemia without coma: Secondary | ICD-10-CM | POA: Diagnosis not present

## 2015-06-02 DIAGNOSIS — N2581 Secondary hyperparathyroidism of renal origin: Secondary | ICD-10-CM | POA: Diagnosis not present

## 2015-06-02 DIAGNOSIS — N186 End stage renal disease: Secondary | ICD-10-CM | POA: Diagnosis not present

## 2015-06-02 DIAGNOSIS — Z992 Dependence on renal dialysis: Secondary | ICD-10-CM | POA: Diagnosis not present

## 2015-06-05 DIAGNOSIS — Z992 Dependence on renal dialysis: Secondary | ICD-10-CM | POA: Diagnosis not present

## 2015-06-05 DIAGNOSIS — D509 Iron deficiency anemia, unspecified: Secondary | ICD-10-CM | POA: Diagnosis not present

## 2015-06-05 DIAGNOSIS — E10649 Type 1 diabetes mellitus with hypoglycemia without coma: Secondary | ICD-10-CM | POA: Diagnosis not present

## 2015-06-05 DIAGNOSIS — E162 Hypoglycemia, unspecified: Secondary | ICD-10-CM | POA: Diagnosis not present

## 2015-06-05 DIAGNOSIS — N2581 Secondary hyperparathyroidism of renal origin: Secondary | ICD-10-CM | POA: Diagnosis not present

## 2015-06-05 DIAGNOSIS — N186 End stage renal disease: Secondary | ICD-10-CM | POA: Diagnosis not present

## 2015-06-07 DIAGNOSIS — N186 End stage renal disease: Secondary | ICD-10-CM | POA: Diagnosis not present

## 2015-06-07 DIAGNOSIS — D509 Iron deficiency anemia, unspecified: Secondary | ICD-10-CM | POA: Diagnosis not present

## 2015-06-07 DIAGNOSIS — N2581 Secondary hyperparathyroidism of renal origin: Secondary | ICD-10-CM | POA: Diagnosis not present

## 2015-06-07 DIAGNOSIS — Z992 Dependence on renal dialysis: Secondary | ICD-10-CM | POA: Diagnosis not present

## 2015-06-07 DIAGNOSIS — E10649 Type 1 diabetes mellitus with hypoglycemia without coma: Secondary | ICD-10-CM | POA: Diagnosis not present

## 2015-06-07 DIAGNOSIS — E162 Hypoglycemia, unspecified: Secondary | ICD-10-CM | POA: Diagnosis not present

## 2015-06-09 DIAGNOSIS — E162 Hypoglycemia, unspecified: Secondary | ICD-10-CM | POA: Diagnosis not present

## 2015-06-09 DIAGNOSIS — D509 Iron deficiency anemia, unspecified: Secondary | ICD-10-CM | POA: Diagnosis not present

## 2015-06-09 DIAGNOSIS — N2581 Secondary hyperparathyroidism of renal origin: Secondary | ICD-10-CM | POA: Diagnosis not present

## 2015-06-09 DIAGNOSIS — N186 End stage renal disease: Secondary | ICD-10-CM | POA: Diagnosis not present

## 2015-06-09 DIAGNOSIS — E10649 Type 1 diabetes mellitus with hypoglycemia without coma: Secondary | ICD-10-CM | POA: Diagnosis not present

## 2015-06-09 DIAGNOSIS — Z992 Dependence on renal dialysis: Secondary | ICD-10-CM | POA: Diagnosis not present

## 2015-06-12 DIAGNOSIS — E162 Hypoglycemia, unspecified: Secondary | ICD-10-CM | POA: Diagnosis not present

## 2015-06-12 DIAGNOSIS — N186 End stage renal disease: Secondary | ICD-10-CM | POA: Diagnosis not present

## 2015-06-12 DIAGNOSIS — E10649 Type 1 diabetes mellitus with hypoglycemia without coma: Secondary | ICD-10-CM | POA: Diagnosis not present

## 2015-06-12 DIAGNOSIS — D509 Iron deficiency anemia, unspecified: Secondary | ICD-10-CM | POA: Diagnosis not present

## 2015-06-12 DIAGNOSIS — Z992 Dependence on renal dialysis: Secondary | ICD-10-CM | POA: Diagnosis not present

## 2015-06-12 DIAGNOSIS — N2581 Secondary hyperparathyroidism of renal origin: Secondary | ICD-10-CM | POA: Diagnosis not present

## 2015-06-13 DIAGNOSIS — N186 End stage renal disease: Secondary | ICD-10-CM | POA: Diagnosis not present

## 2015-06-14 DIAGNOSIS — D509 Iron deficiency anemia, unspecified: Secondary | ICD-10-CM | POA: Diagnosis not present

## 2015-06-14 DIAGNOSIS — N2581 Secondary hyperparathyroidism of renal origin: Secondary | ICD-10-CM | POA: Diagnosis not present

## 2015-06-14 DIAGNOSIS — N186 End stage renal disease: Secondary | ICD-10-CM | POA: Diagnosis not present

## 2015-06-14 DIAGNOSIS — E162 Hypoglycemia, unspecified: Secondary | ICD-10-CM | POA: Diagnosis not present

## 2015-06-14 DIAGNOSIS — Z992 Dependence on renal dialysis: Secondary | ICD-10-CM | POA: Diagnosis not present

## 2015-06-14 DIAGNOSIS — E10649 Type 1 diabetes mellitus with hypoglycemia without coma: Secondary | ICD-10-CM | POA: Diagnosis not present

## 2015-06-16 DIAGNOSIS — D509 Iron deficiency anemia, unspecified: Secondary | ICD-10-CM | POA: Diagnosis not present

## 2015-06-16 DIAGNOSIS — Z992 Dependence on renal dialysis: Secondary | ICD-10-CM | POA: Diagnosis not present

## 2015-06-16 DIAGNOSIS — N2581 Secondary hyperparathyroidism of renal origin: Secondary | ICD-10-CM | POA: Diagnosis not present

## 2015-06-16 DIAGNOSIS — N186 End stage renal disease: Secondary | ICD-10-CM | POA: Diagnosis not present

## 2015-06-16 DIAGNOSIS — E1122 Type 2 diabetes mellitus with diabetic chronic kidney disease: Secondary | ICD-10-CM | POA: Diagnosis not present

## 2015-06-16 DIAGNOSIS — E10649 Type 1 diabetes mellitus with hypoglycemia without coma: Secondary | ICD-10-CM | POA: Diagnosis not present

## 2015-06-16 DIAGNOSIS — Z4931 Encounter for adequacy testing for hemodialysis: Secondary | ICD-10-CM | POA: Diagnosis not present

## 2015-06-16 DIAGNOSIS — E162 Hypoglycemia, unspecified: Secondary | ICD-10-CM | POA: Diagnosis not present

## 2015-06-19 DIAGNOSIS — Z992 Dependence on renal dialysis: Secondary | ICD-10-CM | POA: Diagnosis not present

## 2015-06-19 DIAGNOSIS — N186 End stage renal disease: Secondary | ICD-10-CM | POA: Diagnosis not present

## 2015-06-19 DIAGNOSIS — N2581 Secondary hyperparathyroidism of renal origin: Secondary | ICD-10-CM | POA: Diagnosis not present

## 2015-06-19 DIAGNOSIS — E10649 Type 1 diabetes mellitus with hypoglycemia without coma: Secondary | ICD-10-CM | POA: Diagnosis not present

## 2015-06-19 DIAGNOSIS — E162 Hypoglycemia, unspecified: Secondary | ICD-10-CM | POA: Diagnosis not present

## 2015-06-19 DIAGNOSIS — D509 Iron deficiency anemia, unspecified: Secondary | ICD-10-CM | POA: Diagnosis not present

## 2015-06-19 DIAGNOSIS — Z794 Long term (current) use of insulin: Secondary | ICD-10-CM | POA: Diagnosis not present

## 2015-06-21 DIAGNOSIS — E10649 Type 1 diabetes mellitus with hypoglycemia without coma: Secondary | ICD-10-CM | POA: Diagnosis not present

## 2015-06-21 DIAGNOSIS — D509 Iron deficiency anemia, unspecified: Secondary | ICD-10-CM | POA: Diagnosis not present

## 2015-06-21 DIAGNOSIS — Z992 Dependence on renal dialysis: Secondary | ICD-10-CM | POA: Diagnosis not present

## 2015-06-21 DIAGNOSIS — N2581 Secondary hyperparathyroidism of renal origin: Secondary | ICD-10-CM | POA: Diagnosis not present

## 2015-06-21 DIAGNOSIS — N186 End stage renal disease: Secondary | ICD-10-CM | POA: Diagnosis not present

## 2015-06-21 DIAGNOSIS — E162 Hypoglycemia, unspecified: Secondary | ICD-10-CM | POA: Diagnosis not present

## 2015-06-23 DIAGNOSIS — D509 Iron deficiency anemia, unspecified: Secondary | ICD-10-CM | POA: Diagnosis not present

## 2015-06-23 DIAGNOSIS — N2581 Secondary hyperparathyroidism of renal origin: Secondary | ICD-10-CM | POA: Diagnosis not present

## 2015-06-23 DIAGNOSIS — Z992 Dependence on renal dialysis: Secondary | ICD-10-CM | POA: Diagnosis not present

## 2015-06-23 DIAGNOSIS — E10649 Type 1 diabetes mellitus with hypoglycemia without coma: Secondary | ICD-10-CM | POA: Diagnosis not present

## 2015-06-23 DIAGNOSIS — E162 Hypoglycemia, unspecified: Secondary | ICD-10-CM | POA: Diagnosis not present

## 2015-06-23 DIAGNOSIS — N186 End stage renal disease: Secondary | ICD-10-CM | POA: Diagnosis not present

## 2015-06-26 DIAGNOSIS — E10649 Type 1 diabetes mellitus with hypoglycemia without coma: Secondary | ICD-10-CM | POA: Diagnosis not present

## 2015-06-26 DIAGNOSIS — Z794 Long term (current) use of insulin: Secondary | ICD-10-CM | POA: Diagnosis not present

## 2015-06-26 DIAGNOSIS — Z7982 Long term (current) use of aspirin: Secondary | ICD-10-CM | POA: Diagnosis not present

## 2015-06-26 DIAGNOSIS — T82858A Stenosis of vascular prosthetic devices, implants and grafts, initial encounter: Secondary | ICD-10-CM | POA: Diagnosis not present

## 2015-06-26 DIAGNOSIS — E1122 Type 2 diabetes mellitus with diabetic chronic kidney disease: Secondary | ICD-10-CM | POA: Diagnosis not present

## 2015-06-26 DIAGNOSIS — E162 Hypoglycemia, unspecified: Secondary | ICD-10-CM | POA: Diagnosis not present

## 2015-06-26 DIAGNOSIS — J45909 Unspecified asthma, uncomplicated: Secondary | ICD-10-CM | POA: Diagnosis not present

## 2015-06-26 DIAGNOSIS — Z885 Allergy status to narcotic agent status: Secondary | ICD-10-CM | POA: Diagnosis not present

## 2015-06-26 DIAGNOSIS — I4891 Unspecified atrial fibrillation: Secondary | ICD-10-CM | POA: Diagnosis not present

## 2015-06-26 DIAGNOSIS — N186 End stage renal disease: Secondary | ICD-10-CM | POA: Diagnosis not present

## 2015-06-26 DIAGNOSIS — Z7951 Long term (current) use of inhaled steroids: Secondary | ICD-10-CM | POA: Diagnosis not present

## 2015-06-26 DIAGNOSIS — I132 Hypertensive heart and chronic kidney disease with heart failure and with stage 5 chronic kidney disease, or end stage renal disease: Secondary | ICD-10-CM | POA: Diagnosis not present

## 2015-06-26 DIAGNOSIS — D509 Iron deficiency anemia, unspecified: Secondary | ICD-10-CM | POA: Diagnosis not present

## 2015-06-26 DIAGNOSIS — Z888 Allergy status to other drugs, medicaments and biological substances status: Secondary | ICD-10-CM | POA: Diagnosis not present

## 2015-06-26 DIAGNOSIS — I351 Nonrheumatic aortic (valve) insufficiency: Secondary | ICD-10-CM | POA: Diagnosis not present

## 2015-06-26 DIAGNOSIS — Z79899 Other long term (current) drug therapy: Secondary | ICD-10-CM | POA: Diagnosis not present

## 2015-06-26 DIAGNOSIS — N2581 Secondary hyperparathyroidism of renal origin: Secondary | ICD-10-CM | POA: Diagnosis not present

## 2015-06-26 DIAGNOSIS — Z992 Dependence on renal dialysis: Secondary | ICD-10-CM | POA: Diagnosis not present

## 2015-06-26 DIAGNOSIS — I340 Nonrheumatic mitral (valve) insufficiency: Secondary | ICD-10-CM | POA: Diagnosis not present

## 2015-06-26 DIAGNOSIS — E039 Hypothyroidism, unspecified: Secondary | ICD-10-CM | POA: Diagnosis not present

## 2015-06-26 DIAGNOSIS — Z8673 Personal history of transient ischemic attack (TIA), and cerebral infarction without residual deficits: Secondary | ICD-10-CM | POA: Diagnosis not present

## 2015-06-26 DIAGNOSIS — I509 Heart failure, unspecified: Secondary | ICD-10-CM | POA: Diagnosis not present

## 2015-06-27 DIAGNOSIS — N189 Chronic kidney disease, unspecified: Secondary | ICD-10-CM | POA: Diagnosis not present

## 2015-06-27 DIAGNOSIS — E1022 Type 1 diabetes mellitus with diabetic chronic kidney disease: Secondary | ICD-10-CM | POA: Diagnosis not present

## 2015-06-27 DIAGNOSIS — H2640 Unspecified secondary cataract: Secondary | ICD-10-CM | POA: Diagnosis not present

## 2015-06-27 DIAGNOSIS — H40003 Preglaucoma, unspecified, bilateral: Secondary | ICD-10-CM | POA: Diagnosis not present

## 2015-06-28 DIAGNOSIS — Z992 Dependence on renal dialysis: Secondary | ICD-10-CM | POA: Diagnosis not present

## 2015-06-28 DIAGNOSIS — E162 Hypoglycemia, unspecified: Secondary | ICD-10-CM | POA: Diagnosis not present

## 2015-06-28 DIAGNOSIS — N186 End stage renal disease: Secondary | ICD-10-CM | POA: Diagnosis not present

## 2015-06-28 DIAGNOSIS — E10649 Type 1 diabetes mellitus with hypoglycemia without coma: Secondary | ICD-10-CM | POA: Diagnosis not present

## 2015-06-28 DIAGNOSIS — N2581 Secondary hyperparathyroidism of renal origin: Secondary | ICD-10-CM | POA: Diagnosis not present

## 2015-06-28 DIAGNOSIS — D509 Iron deficiency anemia, unspecified: Secondary | ICD-10-CM | POA: Diagnosis not present

## 2015-06-30 DIAGNOSIS — Z992 Dependence on renal dialysis: Secondary | ICD-10-CM | POA: Diagnosis not present

## 2015-06-30 DIAGNOSIS — N2581 Secondary hyperparathyroidism of renal origin: Secondary | ICD-10-CM | POA: Diagnosis not present

## 2015-06-30 DIAGNOSIS — D509 Iron deficiency anemia, unspecified: Secondary | ICD-10-CM | POA: Diagnosis not present

## 2015-06-30 DIAGNOSIS — N186 End stage renal disease: Secondary | ICD-10-CM | POA: Diagnosis not present

## 2015-06-30 DIAGNOSIS — E10649 Type 1 diabetes mellitus with hypoglycemia without coma: Secondary | ICD-10-CM | POA: Diagnosis not present

## 2015-06-30 DIAGNOSIS — E162 Hypoglycemia, unspecified: Secondary | ICD-10-CM | POA: Diagnosis not present

## 2015-07-03 DIAGNOSIS — E162 Hypoglycemia, unspecified: Secondary | ICD-10-CM | POA: Diagnosis not present

## 2015-07-03 DIAGNOSIS — N2581 Secondary hyperparathyroidism of renal origin: Secondary | ICD-10-CM | POA: Diagnosis not present

## 2015-07-03 DIAGNOSIS — D509 Iron deficiency anemia, unspecified: Secondary | ICD-10-CM | POA: Diagnosis not present

## 2015-07-03 DIAGNOSIS — E10649 Type 1 diabetes mellitus with hypoglycemia without coma: Secondary | ICD-10-CM | POA: Diagnosis not present

## 2015-07-03 DIAGNOSIS — N186 End stage renal disease: Secondary | ICD-10-CM | POA: Diagnosis not present

## 2015-07-03 DIAGNOSIS — Z992 Dependence on renal dialysis: Secondary | ICD-10-CM | POA: Diagnosis not present

## 2015-07-05 DIAGNOSIS — R9431 Abnormal electrocardiogram [ECG] [EKG]: Secondary | ICD-10-CM | POA: Diagnosis not present

## 2015-07-05 DIAGNOSIS — E8889 Other specified metabolic disorders: Secondary | ICD-10-CM | POA: Diagnosis not present

## 2015-07-05 DIAGNOSIS — R609 Edema, unspecified: Secondary | ICD-10-CM | POA: Diagnosis not present

## 2015-07-05 DIAGNOSIS — S82401A Unspecified fracture of shaft of right fibula, initial encounter for closed fracture: Secondary | ICD-10-CM | POA: Diagnosis not present

## 2015-07-05 DIAGNOSIS — I130 Hypertensive heart and chronic kidney disease with heart failure and stage 1 through stage 4 chronic kidney disease, or unspecified chronic kidney disease: Secondary | ICD-10-CM | POA: Diagnosis not present

## 2015-07-05 DIAGNOSIS — E875 Hyperkalemia: Secondary | ICD-10-CM | POA: Diagnosis not present

## 2015-07-05 DIAGNOSIS — E1122 Type 2 diabetes mellitus with diabetic chronic kidney disease: Secondary | ICD-10-CM | POA: Diagnosis not present

## 2015-07-05 DIAGNOSIS — I083 Combined rheumatic disorders of mitral, aortic and tricuspid valves: Secondary | ICD-10-CM | POA: Diagnosis not present

## 2015-07-05 DIAGNOSIS — D638 Anemia in other chronic diseases classified elsewhere: Secondary | ICD-10-CM | POA: Diagnosis not present

## 2015-07-05 DIAGNOSIS — Z992 Dependence on renal dialysis: Secondary | ICD-10-CM | POA: Diagnosis not present

## 2015-07-05 DIAGNOSIS — N186 End stage renal disease: Secondary | ICD-10-CM | POA: Diagnosis not present

## 2015-07-05 DIAGNOSIS — E039 Hypothyroidism, unspecified: Secondary | ICD-10-CM | POA: Diagnosis not present

## 2015-07-05 DIAGNOSIS — S82191A Other fracture of upper end of right tibia, initial encounter for closed fracture: Secondary | ICD-10-CM | POA: Diagnosis not present

## 2015-07-05 DIAGNOSIS — M79604 Pain in right leg: Secondary | ICD-10-CM | POA: Diagnosis not present

## 2015-07-05 DIAGNOSIS — D62 Acute posthemorrhagic anemia: Secondary | ICD-10-CM | POA: Diagnosis not present

## 2015-07-05 DIAGNOSIS — Z6826 Body mass index (BMI) 26.0-26.9, adult: Secondary | ICD-10-CM | POA: Diagnosis not present

## 2015-07-05 DIAGNOSIS — S82831A Other fracture of upper and lower end of right fibula, initial encounter for closed fracture: Secondary | ICD-10-CM | POA: Diagnosis not present

## 2015-07-05 DIAGNOSIS — R413 Other amnesia: Secondary | ICD-10-CM | POA: Diagnosis not present

## 2015-07-05 DIAGNOSIS — D72829 Elevated white blood cell count, unspecified: Secondary | ICD-10-CM | POA: Diagnosis not present

## 2015-07-05 DIAGNOSIS — Z885 Allergy status to narcotic agent status: Secondary | ICD-10-CM | POA: Diagnosis not present

## 2015-07-05 DIAGNOSIS — Z888 Allergy status to other drugs, medicaments and biological substances status: Secondary | ICD-10-CM | POA: Diagnosis not present

## 2015-07-05 DIAGNOSIS — N189 Chronic kidney disease, unspecified: Secondary | ICD-10-CM | POA: Diagnosis not present

## 2015-07-05 DIAGNOSIS — E119 Type 2 diabetes mellitus without complications: Secondary | ICD-10-CM | POA: Diagnosis not present

## 2015-07-05 DIAGNOSIS — S82101D Unspecified fracture of upper end of right tibia, subsequent encounter for closed fracture with routine healing: Secondary | ICD-10-CM | POA: Diagnosis not present

## 2015-07-05 DIAGNOSIS — S82251A Displaced comminuted fracture of shaft of right tibia, initial encounter for closed fracture: Secondary | ICD-10-CM | POA: Diagnosis not present

## 2015-07-05 DIAGNOSIS — I5032 Chronic diastolic (congestive) heart failure: Secondary | ICD-10-CM | POA: Diagnosis not present

## 2015-07-05 DIAGNOSIS — E1129 Type 2 diabetes mellitus with other diabetic kidney complication: Secondary | ICD-10-CM | POA: Diagnosis not present

## 2015-07-05 DIAGNOSIS — I132 Hypertensive heart and chronic kidney disease with heart failure and with stage 5 chronic kidney disease, or end stage renal disease: Secondary | ICD-10-CM | POA: Diagnosis not present

## 2015-07-05 DIAGNOSIS — I959 Hypotension, unspecified: Secondary | ICD-10-CM | POA: Diagnosis not present

## 2015-07-05 DIAGNOSIS — J449 Chronic obstructive pulmonary disease, unspecified: Secondary | ICD-10-CM | POA: Diagnosis not present

## 2015-07-05 DIAGNOSIS — S8291XS Unspecified fracture of right lower leg, sequela: Secondary | ICD-10-CM | POA: Diagnosis not present

## 2015-07-05 DIAGNOSIS — S82201A Unspecified fracture of shaft of right tibia, initial encounter for closed fracture: Secondary | ICD-10-CM | POA: Diagnosis not present

## 2015-07-05 DIAGNOSIS — E440 Moderate protein-calorie malnutrition: Secondary | ICD-10-CM | POA: Diagnosis not present

## 2015-07-05 DIAGNOSIS — R05 Cough: Secondary | ICD-10-CM | POA: Diagnosis not present

## 2015-07-05 DIAGNOSIS — I10 Essential (primary) hypertension: Secondary | ICD-10-CM | POA: Diagnosis not present

## 2015-07-05 DIAGNOSIS — G8918 Other acute postprocedural pain: Secondary | ICD-10-CM | POA: Diagnosis not present

## 2015-07-05 DIAGNOSIS — I480 Paroxysmal atrial fibrillation: Secondary | ICD-10-CM | POA: Diagnosis not present

## 2015-07-05 DIAGNOSIS — Z967 Presence of other bone and tendon implants: Secondary | ICD-10-CM | POA: Diagnosis not present

## 2015-07-05 DIAGNOSIS — S82101A Unspecified fracture of upper end of right tibia, initial encounter for closed fracture: Secondary | ICD-10-CM | POA: Diagnosis not present

## 2015-07-05 DIAGNOSIS — I5030 Unspecified diastolic (congestive) heart failure: Secondary | ICD-10-CM | POA: Diagnosis not present

## 2015-07-05 DIAGNOSIS — Z9889 Other specified postprocedural states: Secondary | ICD-10-CM | POA: Diagnosis not present

## 2015-07-05 DIAGNOSIS — R296 Repeated falls: Secondary | ICD-10-CM | POA: Diagnosis not present

## 2015-07-05 DIAGNOSIS — Z9049 Acquired absence of other specified parts of digestive tract: Secondary | ICD-10-CM | POA: Diagnosis not present

## 2015-07-05 DIAGNOSIS — W19XXXA Unspecified fall, initial encounter: Secondary | ICD-10-CM | POA: Diagnosis not present

## 2015-07-11 ENCOUNTER — Encounter: Payer: Self-pay | Admitting: *Deleted

## 2015-07-12 ENCOUNTER — Inpatient Hospital Stay (HOSPITAL_COMMUNITY)
Admission: RE | Admit: 2015-07-12 | Discharge: 2015-07-28 | DRG: 559 | Disposition: A | Discharge status: Home Health | Payer: Medicare Other | Source: Intra-hospital | Attending: Physical Medicine & Rehabilitation | Admitting: Physical Medicine & Rehabilitation

## 2015-07-12 ENCOUNTER — Encounter (HOSPITAL_COMMUNITY): Payer: Self-pay | Admitting: *Deleted

## 2015-07-12 ENCOUNTER — Other Ambulatory Visit (HOSPITAL_COMMUNITY): Payer: Self-pay | Admitting: Physician Assistant

## 2015-07-12 DIAGNOSIS — E039 Hypothyroidism, unspecified: Secondary | ICD-10-CM | POA: Diagnosis present

## 2015-07-12 DIAGNOSIS — N2581 Secondary hyperparathyroidism of renal origin: Secondary | ICD-10-CM | POA: Diagnosis present

## 2015-07-12 DIAGNOSIS — F09 Unspecified mental disorder due to known physiological condition: Secondary | ICD-10-CM | POA: Diagnosis present

## 2015-07-12 DIAGNOSIS — Z992 Dependence on renal dialysis: Secondary | ICD-10-CM | POA: Diagnosis not present

## 2015-07-12 DIAGNOSIS — I10 Essential (primary) hypertension: Secondary | ICD-10-CM | POA: Insufficient documentation

## 2015-07-12 DIAGNOSIS — Z885 Allergy status to narcotic agent status: Secondary | ICD-10-CM

## 2015-07-12 DIAGNOSIS — R609 Edema, unspecified: Secondary | ICD-10-CM | POA: Insufficient documentation

## 2015-07-12 DIAGNOSIS — R413 Other amnesia: Secondary | ICD-10-CM | POA: Diagnosis not present

## 2015-07-12 DIAGNOSIS — Z720 Tobacco use: Secondary | ICD-10-CM | POA: Diagnosis not present

## 2015-07-12 DIAGNOSIS — Z888 Allergy status to other drugs, medicaments and biological substances status: Secondary | ICD-10-CM

## 2015-07-12 DIAGNOSIS — E11649 Type 2 diabetes mellitus with hypoglycemia without coma: Secondary | ICD-10-CM | POA: Diagnosis not present

## 2015-07-12 DIAGNOSIS — M19042 Primary osteoarthritis, left hand: Secondary | ICD-10-CM | POA: Diagnosis present

## 2015-07-12 DIAGNOSIS — S82201A Unspecified fracture of shaft of right tibia, initial encounter for closed fracture: Secondary | ICD-10-CM | POA: Diagnosis present

## 2015-07-12 DIAGNOSIS — G8918 Other acute postprocedural pain: Secondary | ICD-10-CM | POA: Insufficient documentation

## 2015-07-12 DIAGNOSIS — I480 Paroxysmal atrial fibrillation: Secondary | ICD-10-CM | POA: Diagnosis not present

## 2015-07-12 DIAGNOSIS — R7309 Other abnormal glucose: Secondary | ICD-10-CM | POA: Insufficient documentation

## 2015-07-12 DIAGNOSIS — Z853 Personal history of malignant neoplasm of breast: Secondary | ICD-10-CM

## 2015-07-12 DIAGNOSIS — R5381 Other malaise: Secondary | ICD-10-CM | POA: Diagnosis not present

## 2015-07-12 DIAGNOSIS — S82401A Unspecified fracture of shaft of right fibula, initial encounter for closed fracture: Secondary | ICD-10-CM

## 2015-07-12 DIAGNOSIS — Z4931 Encounter for adequacy testing for hemodialysis: Secondary | ICD-10-CM | POA: Diagnosis not present

## 2015-07-12 DIAGNOSIS — E119 Type 2 diabetes mellitus without complications: Secondary | ICD-10-CM | POA: Insufficient documentation

## 2015-07-12 DIAGNOSIS — S82251D Displaced comminuted fracture of shaft of right tibia, subsequent encounter for closed fracture with routine healing: Secondary | ICD-10-CM | POA: Diagnosis not present

## 2015-07-12 DIAGNOSIS — I5032 Chronic diastolic (congestive) heart failure: Secondary | ICD-10-CM | POA: Insufficient documentation

## 2015-07-12 DIAGNOSIS — N186 End stage renal disease: Secondary | ICD-10-CM | POA: Insufficient documentation

## 2015-07-12 DIAGNOSIS — M19041 Primary osteoarthritis, right hand: Secondary | ICD-10-CM | POA: Diagnosis present

## 2015-07-12 DIAGNOSIS — I132 Hypertensive heart and chronic kidney disease with heart failure and with stage 5 chronic kidney disease, or end stage renal disease: Secondary | ICD-10-CM | POA: Diagnosis present

## 2015-07-12 DIAGNOSIS — S72009A Fracture of unspecified part of neck of unspecified femur, initial encounter for closed fracture: Secondary | ICD-10-CM | POA: Insufficient documentation

## 2015-07-12 DIAGNOSIS — S8291XS Unspecified fracture of right lower leg, sequela: Secondary | ICD-10-CM | POA: Diagnosis not present

## 2015-07-12 DIAGNOSIS — E1122 Type 2 diabetes mellitus with diabetic chronic kidney disease: Secondary | ICD-10-CM | POA: Diagnosis not present

## 2015-07-12 DIAGNOSIS — D62 Acute posthemorrhagic anemia: Secondary | ICD-10-CM | POA: Diagnosis not present

## 2015-07-12 DIAGNOSIS — S82451D Displaced comminuted fracture of shaft of right fibula, subsequent encounter for closed fracture with routine healing: Secondary | ICD-10-CM

## 2015-07-12 DIAGNOSIS — I120 Hypertensive chronic kidney disease with stage 5 chronic kidney disease or end stage renal disease: Secondary | ICD-10-CM | POA: Diagnosis not present

## 2015-07-12 DIAGNOSIS — D72829 Elevated white blood cell count, unspecified: Secondary | ICD-10-CM | POA: Diagnosis not present

## 2015-07-12 DIAGNOSIS — E1129 Type 2 diabetes mellitus with other diabetic kidney complication: Secondary | ICD-10-CM | POA: Diagnosis not present

## 2015-07-12 DIAGNOSIS — E785 Hyperlipidemia, unspecified: Secondary | ICD-10-CM | POA: Diagnosis present

## 2015-07-12 DIAGNOSIS — S82201D Unspecified fracture of shaft of right tibia, subsequent encounter for closed fracture with routine healing: Secondary | ICD-10-CM | POA: Diagnosis not present

## 2015-07-12 DIAGNOSIS — D631 Anemia in chronic kidney disease: Secondary | ICD-10-CM | POA: Diagnosis not present

## 2015-07-12 LAB — GLUCOSE, CAPILLARY
GLUCOSE-CAPILLARY: 38 mg/dL — AB (ref 65–99)
GLUCOSE-CAPILLARY: 74 mg/dL (ref 65–99)
Glucose-Capillary: 251 mg/dL — ABNORMAL HIGH (ref 65–99)
Glucose-Capillary: 45 mg/dL — ABNORMAL LOW (ref 65–99)

## 2015-07-12 LAB — CBC
HEMATOCRIT: 33.7 % — AB (ref 36.0–46.0)
Hemoglobin: 10.7 g/dL — ABNORMAL LOW (ref 12.0–15.0)
MCH: 30.2 pg (ref 26.0–34.0)
MCHC: 31.8 g/dL (ref 30.0–36.0)
MCV: 95.2 fL (ref 78.0–100.0)
PLATELETS: 281 10*3/uL (ref 150–400)
RBC: 3.54 MIL/uL — ABNORMAL LOW (ref 3.87–5.11)
RDW: 15 % (ref 11.5–15.5)
WBC: 10.1 10*3/uL (ref 4.0–10.5)

## 2015-07-12 LAB — CREATININE, SERUM
Creatinine, Ser: 3 mg/dL — ABNORMAL HIGH (ref 0.44–1.00)
GFR calc Af Amer: 15 mL/min — ABNORMAL LOW (ref >60)
GFR, EST NON AFRICAN AMERICAN: 13 mL/min — AB (ref >60)

## 2015-07-12 MED ORDER — ASPIRIN 325 MG PO TABS
325.0000 mg | ORAL_TABLET | Freq: Every day | ORAL | Status: DC
  Administered 2015-07-12 – 2015-07-28 (×16): 325 mg via ORAL
  Filled 2015-07-12 (×16): qty 1

## 2015-07-12 MED ORDER — VITAMIN D 1000 UNITS PO TABS
5000.0000 [IU] | ORAL_TABLET | Freq: Every day | ORAL | Status: DC
Start: 2015-07-12 — End: 2015-07-15
  Administered 2015-07-12 – 2015-07-15 (×3): 5000 [IU] via ORAL
  Filled 2015-07-12 (×4): qty 5

## 2015-07-12 MED ORDER — INSULIN ASPART 100 UNIT/ML ~~LOC~~ SOLN
0.0000 [IU] | Freq: Three times a day (TID) | SUBCUTANEOUS | Status: DC
  Administered 2015-07-12: 12 [IU] via SUBCUTANEOUS
  Administered 2015-07-13: 16 [IU] via SUBCUTANEOUS
  Administered 2015-07-13: 8 [IU] via SUBCUTANEOUS
  Administered 2015-07-14: 4 [IU] via SUBCUTANEOUS
  Administered 2015-07-14 – 2015-07-15 (×2): 2 [IU] via SUBCUTANEOUS
  Administered 2015-07-15: 8 [IU] via SUBCUTANEOUS
  Administered 2015-07-16: 4 [IU] via SUBCUTANEOUS
  Administered 2015-07-16: 2 [IU] via SUBCUTANEOUS
  Administered 2015-07-16: 12 [IU] via SUBCUTANEOUS
  Administered 2015-07-17: 4 [IU] via SUBCUTANEOUS
  Administered 2015-07-17: 2 [IU] via SUBCUTANEOUS
  Administered 2015-07-17: 12 [IU] via SUBCUTANEOUS
  Administered 2015-07-18: 2 [IU] via SUBCUTANEOUS
  Administered 2015-07-18: 12 [IU] via SUBCUTANEOUS
  Administered 2015-07-18: 8 [IU] via SUBCUTANEOUS

## 2015-07-12 MED ORDER — ONDANSETRON HCL 4 MG/2ML IJ SOLN: 4.0000 mg | Freq: Four times a day (QID) | INTRAMUSCULAR | Status: DC | PRN

## 2015-07-12 MED ORDER — RENA-VITE PO TABS
1.0000 | ORAL_TABLET | Freq: Every day | ORAL | Status: DC
  Administered 2015-07-12 – 2015-07-27 (×16): 1 via ORAL
  Filled 2015-07-12 (×16): qty 1

## 2015-07-12 MED ORDER — ISOSORBIDE MONONITRATE ER 30 MG PO TB24
30.0000 mg | ORAL_TABLET | Freq: Every day | ORAL | Status: DC
  Administered 2015-07-12 – 2015-07-28 (×15): 30 mg via ORAL
  Filled 2015-07-12 (×15): qty 1

## 2015-07-12 MED ORDER — ACETAMINOPHEN 325 MG PO TABS
325.0000 mg | ORAL_TABLET | ORAL | Status: DC | PRN
  Administered 2015-07-16 – 2015-07-17 (×3): 650 mg via ORAL
  Administered 2015-07-19: 325 mg via ORAL
  Administered 2015-07-19 – 2015-07-23 (×4): 650 mg via ORAL
  Administered 2015-07-23 – 2015-07-24 (×2): 325 mg via ORAL
  Administered 2015-07-24: 650 mg via ORAL
  Administered 2015-07-25 – 2015-07-26 (×4): 325 mg via ORAL
  Administered 2015-07-27: 650 mg via ORAL
  Administered 2015-07-27 – 2015-07-28 (×2): 325 mg via ORAL
  Filled 2015-07-12 (×2): qty 1
  Filled 2015-07-12 (×5): qty 2
  Filled 2015-07-12: qty 1
  Filled 2015-07-12 (×6): qty 2

## 2015-07-12 MED ORDER — LEVOTHYROXINE SODIUM 75 MCG PO TABS
75.0000 ug | ORAL_TABLET | Freq: Every day | ORAL | Status: DC
  Administered 2015-07-13 – 2015-07-28 (×16): 75 ug via ORAL
  Filled 2015-07-12 (×16): qty 1

## 2015-07-12 MED ORDER — CALCIUM ACETATE (PHOS BINDER) 667 MG PO CAPS
667.0000 mg | ORAL_CAPSULE | Freq: Two times a day (BID) | ORAL | Status: DC
  Administered 2015-07-12 – 2015-07-15 (×5): 667 mg via ORAL
  Filled 2015-07-12 (×5): qty 1

## 2015-07-12 MED ORDER — HYDRALAZINE HCL 50 MG PO TABS
50.0000 mg | ORAL_TABLET | Freq: Two times a day (BID) | ORAL | Status: DC
  Administered 2015-07-12 – 2015-07-28 (×24): 50 mg via ORAL
  Filled 2015-07-12 (×28): qty 1

## 2015-07-12 MED ORDER — VITAMIN E 180 MG (400 UNIT) PO CAPS
400.0000 [IU] | ORAL_CAPSULE | Freq: Every day | ORAL | Status: DC
  Administered 2015-07-13 – 2015-07-28 (×15): 400 [IU] via ORAL
  Filled 2015-07-12 (×16): qty 1

## 2015-07-12 MED ORDER — PRAVASTATIN SODIUM 20 MG PO TABS
20.0000 mg | ORAL_TABLET | Freq: Every day | ORAL | Status: DC
  Administered 2015-07-12 – 2015-07-27 (×15): 20 mg via ORAL
  Filled 2015-07-12 (×16): qty 1

## 2015-07-12 MED ORDER — INSULIN ASPART 100 UNIT/ML ~~LOC~~ SOLN: 0.0000 [IU] | SUBCUTANEOUS | Status: DC

## 2015-07-12 MED ORDER — ONDANSETRON HCL 4 MG PO TABS: 4.0000 mg | ORAL_TABLET | Freq: Four times a day (QID) | ORAL | Status: DC | PRN

## 2015-07-12 MED ORDER — GLUCOSE 40 % PO GEL
ORAL | Status: AC
  Administered 2015-07-12: 22:00:00
  Filled 2015-07-12: qty 1

## 2015-07-12 MED ORDER — NEBIVOLOL HCL 10 MG PO TABS
10.0000 mg | ORAL_TABLET | Freq: Every day | ORAL | Status: DC
Start: 2015-07-13 — End: 2015-07-28
  Administered 2015-07-13 – 2015-07-28 (×14): 10 mg via ORAL
  Filled 2015-07-12 (×16): qty 1

## 2015-07-12 MED ORDER — INSULIN ASPART PROT & ASPART (70-30 MIX) 100 UNIT/ML ~~LOC~~ SUSP: 100.0000 [IU] | Freq: Every day | SUBCUTANEOUS | Status: DC

## 2015-07-12 MED ORDER — SORBITOL 70 % SOLN
30.0000 mL | Freq: Every day | Status: DC | PRN
  Administered 2015-07-15: 30 mL via ORAL
  Filled 2015-07-12 (×2): qty 30

## 2015-07-12 MED ORDER — OXYCODONE HCL 5 MG PO TABS
5.0000 mg | ORAL_TABLET | ORAL | Status: DC | PRN
  Administered 2015-07-14 – 2015-07-18 (×3): 5 mg via ORAL
  Filled 2015-07-12 (×3): qty 1

## 2015-07-12 MED ORDER — HEPARIN SODIUM (PORCINE) 5000 UNIT/ML IJ SOLN
5000.0000 [IU] | Freq: Three times a day (TID) | INTRAMUSCULAR | Status: DC
  Administered 2015-07-12 – 2015-07-28 (×44): 5000 [IU] via SUBCUTANEOUS
  Filled 2015-07-12 (×47): qty 1

## 2015-07-12 NOTE — Progress Notes (Signed)
Patient ID: [name], female   DOB: September 17, 1928, 82 y.o.   MRN: [id number] Patient arrived with family from [facility] with belongings and packet including chart. Patient and family oriented to room, health resource notebook, rehab safety plan, fall prevention plan, rehab process, rehab schedule, and nurse call system. Patient seated in bed with family in room with no c/o of pain.

## 2015-07-12 NOTE — Significant Event (Signed)
Hypoglycemic Event  CBG: 37  Treatment: 15 GM carbohydrate snack  Symptoms: None  Follow-up CBG: Time:2132 CBG Result:45  Possible Reasons for Event: Other: Per daughter- blood sugars drop after HD.  Comments/MD notified:no    Cherlynn Polo

## 2015-07-12 NOTE — H&P (View-Only) (Signed)
Physical Medicine and Rehabilitation Admission H&P     HPI: 82 year old right-handed female well known to rehabilitation services for history of right pilon fracture received inpatient rehabilitation services 2 years ago and was discharged home with daughter. Past medical history of end-stage renal disease with hemodialysis, diabetes mellitus, left breast cancer, PAF maintained on aspirin 81 mg, chronic diastolic congestive heart failure. Patient lives in [place] alone. One level home 3 steps to entry and used a walker and sometimes a cane prior to admission. 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 [place] 07/05/2015 [place] after mechanical fall without loss of consciousness. Sustained comminuted fracture right tibia-fibula. Underwent ORIF 07/07/2015 per Dr. Pollyann Glen. Nonweightbearing with knee immobilizer when out of bed. Hospital course pain management. Hemodialysis ongoing as per renal services. Placed on subcutaneous heparin for DVT prophylaxis bouts of leukocytosis 14,000 felt to be reactive workup was unremarkable. Acute on chronic anemia 10.1 and monitor. Physical occupational therapy evaluations completed ongoing requiring minimal assist for mobility patient was admitted for comprehensive rehabilitation program  Review of Systems  Constitutional: Negative for fever and chills.  HENT: Negative for hearing loss.   Eyes: Negative for blurred vision and double vision.  Respiratory: Negative for cough and shortness of breath.   Cardiovascular: Positive for leg swelling. Negative for chest pain.  Gastrointestinal: Positive for constipation. Negative for nausea and vomiting.  Genitourinary: Negative for dysuria.  Musculoskeletal: Positive for myalgias and falls.  Skin: Negative for rash.  Neurological: Negative for seizures and headaches.  All other systems reviewed and are  negative.  Past Medical History  Diagnosis Date  . CHF (congestive heart failure)   . DM2 (diabetes mellitus, type 2)   . ESRD (end stage renal disease)     [facility] MWF [facility]  . HTN (hypertension)   . Hyperlipidemia   . Tibia/fibula fracture 07/10/2011    right - nonunion repair 06/2012  . Hypothyroid   . Breast cancer   . Arthritis   . Hyperparathyroidism, secondary renal   . Shortness of breath   . Atrial fibrillation dx 06/2012    on coumadin  . Atrial fibrillation 06/25/2012    2D Echo - EF 45-50, left atrium severely dilated, mitral valve heavily calcified annulus with calcification of the subvalvular apparatus   Past Surgical History  Procedure Laterality Date  . Mastectomy    . Abdominal surgery    . Thyroid surgery    . Breast surgery Left     Mastectomy  . Eye surgery Bilateral     Cataract  . Ankle fusion Right   . Arteriovenous graft placement Right   . Orif tibia fracture Right 06/16/2012    Procedure: OPEN REDUCTION INTERNAL FIXATION (ORIF) TIBIA FRACTURE;  Surgeon: Rozanna Box, MD;  Location: [employer];  Service: Orthopedics;  Laterality: Right;   Family History  Problem Relation Age of Onset  . Hyperlipidemia Father   . Diabetes Father   . Breast cancer Sister    Social History:  reports that she has never smoked. Her smokeless tobacco use includes Snuff. She reports that she does not drink alcohol or use illicit drugs. Allergies:  Allergies  Allergen Reactions  . Codeine Swelling  . Coreg [Carvedilol] Other (See Comments)    Headaches  . Lopressor [Metoprolol Tartrate] Other (See Comments)    GI upset    (Not in a hospital admission)  Home:  lives alone  in [facility] independently with a cane walker prior to admission. One level home 3 steps to entry. Patient plans to stay with her daughter in [place] upon discharge who also has a 1 level home   Functional History:  independent with cane walker prior to admission  Functional  Status:  Mobility:  moderate assist sit to stand. Moderate assist for functional mobility        ADL:  mod max assist activities of daily living  Cognition:      Physical Exam: 110/70 pulse 80 respirations 18 temperature 98 Physical Exam  Vitals reviewed. Constitutional: She is oriented to person, place, and time. She appears well-developed and well-nourished.  HENT:  Head: Normocephalic and atraumatic.  Eyes: Conjunctivae and EOM are normal.  Neck: Normal range of motion. Neck supple. No thyromegaly present.  Cardiovascular:  Irregular irregular RUE fistula  Respiratory: Effort normal and breath sounds normal. No respiratory distress.  GI: Soft. Bowel sounds are normal. She exhibits no distension.  Musculoskeletal: She exhibits edema and tenderness.  RLE in knee immobilizer  Neurological: She is alert and oriented to person, place, and time.  A&Ox2 DTRs symmetric Sensation intact to light touch Motor: B/l UE 4/5 proximal to distal LLE: 3- proximally, 4/5 distally RLE: hip flexion 2/5, wiggles toes  Skin:  RLE dressing c/d/i  Psychiatric: She has a normal mood and affect. Her behavior is normal.    No results found for this or any previous visit (from the past 48 hour(s)). No results found.     Medical Problem List and Plan: 1.  Limited functional mobility secondary to comminuted right tibia-fibula fracture. Status post ORIF 07/07/2015. Nonweightbearing. Knee immobilizer when out of bed 2.  DVT Prophylaxis/Anticoagulation: Subcutaneous heparin. Monitor platelet counts and any signs of bleeding 3. Pain Management: Oxycodone 5-10 mg every 4 hours as needed 4. Acute blood loss anemia. Follow-up CBC 5. Neuropsych: This patient is capable of making decisions on her own behalf. 6. Skin/Wound Care: Routine skin checks 7. Fluids/Electrolytes/Nutrition: Routine I&O's with follow-up chemistries 8. End-stage renal disease with hemodialysis. Renal service contacted to  resume hemodialysis 9. PAF. Aspirin 325 mg daily. Cardiac rate control 10. Hypertension. Hydralazine 50 mg twice a day, Imdur 30 mg daily 11. Diastolic congestive heart failure. Monitor for any signs of fluid overload 12. Diabetes mellitus. Check blood sugars before meals and at bedtime. Sliding scale insulin. We'll confirm home diabetic regimen with family 31. Hypothyroidism. Synthroid 75 g daily 14. Hyperlipidemia. Pravachol  Post Admission Physician Evaluation: 1. Functional deficits secondary  to comminuted right tibia-fibula fracture status post ORIF 07/07/2015. 2. Patient is admitted to receive collaborative, interdisciplinary care between the physiatrist, rehab nursing staff, and therapy team. 3. Patient's level of medical complexity and substantial therapy needs in context of that medical necessity cannot be provided at a lesser intensity of care such as a SNF. 4. Patient has experienced substantial functional loss from his/her baseline which was documented above under the "Functional History" and "Functional Status" headings.  Judging by the patient's diagnosis, physical exam, and functional history, the patient has potential for functional progress which will result in measurable gains while on inpatient rehab.  These gains will be of substantial and practical use upon discharge  in facilitating mobility and self-care at the household level. 5. Physiatrist will provide 24 hour management of medical needs as well as oversight of the therapy plan/treatment and provide guidance as appropriate regarding the interaction of the two. 6. 24 hour rehab nursing will assist  with bladder management, safety, skin/wound care, disease management, medication administration, pain management and patient education  and help integrate therapy concepts, techniques,education, etc. 7. PT will assess and treat for/with: Lower extremity strength, range of motion, stamina, balance, functional mobility, safety,  adaptive techniques and equipment, woundcare, coping skills, pain control, education.   Goals are: Min/Mod A. 8. OT will assess and treat for/with: ADL's, functional mobility, safety, upper extremity strength, adaptive techniques and equipment, wound mgt, ego support, and community reintegration.   Goals are: Min/Mod A. Therapy may not proceed with showering this patient. 9. Case Management and Social Worker will assess and treat for psychological issues and discharge planning. 10. Team conference will be held weekly to assess progress toward goals and to determine barriers to discharge. 11. Patient will receive at least 3 hours of therapy per day at least 5 days per week. 12. ELOS: 11-15 days.       13. Prognosis:  excellent   Delice Lesch, MD 07/12/2015

## 2015-07-12 NOTE — PMR Pre-admission (Signed)
[facility] PMR Admission Coordinator Pre-Admission Assessment  Patient: [name] is an 82 y.o., female MRN: [id number] DOB: [birth year] Height: 5\' 2"  (157.5 cm) Weight: 66.225 kg (146 lb)  Insurance Information HMO: No   PPO:       PCP:       IPA:       80/20:       OTHER:   PRIMARY:  Medicare A/B      Policy#: [id number]      Subscriber: Octaviano Glow CM Name:        Phone#:       Fax#:   Pre-Cert#:        Employer:  Retired Benefits:  Phone #:       Name: Checked in [place]. Date: A=12/17/87 and B=08/17/95     Deduct: $1316      Out of Pocket Max: None      Life Max: unlimited CIR: 100%      SNF: 100 days Outpatient: 80%     Co-Pay: 20% Home Health: 100%      Co-Pay: none DME: 80%     Co-Pay: 20% Providers: patient's choice  SECONDARY: [employer]      Policy#: [id number]      Subscriber: Octaviano Glow CM Name:        Phone#:       Fax#:   Pre-Cert#:        Employer:  Retired Benefits:  Phone #: [phone]     Name:   Eff. Date:       Deduct:        Out of Pocket Max:        Life Max:   CIR:        SNF:   Outpatient:       Co-Pay:   Home Health:        Co-Pay:   DME:       Co-Pay:    Emergency Contact Information Contact Information    Name Relation Home Work [place] Daughter [phone]      Current Medical History  Patient Admitting Diagnosis:  R tibia/fibula fracture, ESRD/HD  History of Present Illness: An 82 year old right-handed female well known to rehabilitation services for history of right pilon fracture received inpatient rehabilitation services 2 years ago and was discharged home with daughter. Past medical history of end-stage renal disease with hemodialysis, diabetes mellitus, left breast cancer, PAF maintained on aspirin 81 mg, chronic diastolic congestive heart failure. Patient lives in [place] alone. One level home 3 steps to entry and used a walker and sometimes a cane  prior to admission. She has a daughter in [place] works as a Tourist information centre manager at [employer]. [place] 07/05/2015 [place] after mechanical fall without loss of consciousness. Sustained comminuted fracture right tibia-fibula. Underwent ORIF 07/07/2015 per Dr. Pollyann Glen. Nonweightbearing with knee immobilizer when out of bed. Hospital course pain management. Hemodialysis ongoing as per renal services. Placed on subcutaneous heparin for DVT prophylaxis bouts of leukocytosis 14,000 felt to be reactive workup was unremarkable. Acute on chronic anemia 10.1 and monitor. Physical occupational therapy evaluations completed ongoing requiring minimal to moderate assist for mobility.   Patient to be admitted for comprehensive inpatient rehabilitation program.   Patient's medical record from [facility] in [place], [place] has been reviewed by the rehabilitation admission coordinator and physician.  Past Medical History  Past Medical History  Diagnosis Date  .  CHF (congestive heart failure)   . DM2 (diabetes mellitus, type 2)   . ESRD (end stage renal disease)     [facility] MWF [facility]  . HTN (hypertension)   . Hyperlipidemia   . Tibia/fibula fracture 07/10/2011    right - nonunion repair 06/2012  . Hypothyroid   . Breast cancer   . Arthritis   . Hyperparathyroidism, secondary renal   . Shortness of breath   . Atrial fibrillation dx 06/2012    on coumadin  . Atrial fibrillation 06/25/2012    2D Echo - EF 45-50, left atrium severely dilated, mitral valve heavily calcified annulus with calcification of the subvalvular apparatus    Family History   family history includes Breast cancer in her sister; Diabetes in her father; Hyperlipidemia in her father.  Prior Rehab/Hospitalizations Has the patient had major surgery during 100 days prior to admission? Yes.  Had eye surgery in 12/16.  Was on CIR here at [facility] 2 years ago after a  tibia/fibula fracture.   Current Medications See MAR from [facility]  Patients Current Diet:  Renal diet with 1200 cc fluid restriction per day  Precautions / Restrictions Precautions Precautions: Fall Restrictions Weight Bearing Restrictions: Yes   Has the patient had 2 or more falls or a fall with injury in the past year?Yes.  Has had 3 falls with the most recent fall resulting in current right tibia fibula fracture.  Prior Activity Level Limited Community (1-2x/wk): Did not drive.  Had hired help on Tuesdays for transportation.  Went to MD appointments  Prior Functional Level Self Care: Did the patient need help bathing, dressing, using the toilet or eating?  Independent  Indoor Mobility: Did the patient need assistance with walking from room to room (with or without device)? Independent  Stairs: Did the patient need assistance with internal or external stairs (with or without device)? Independent  Functional Cognition: Did the patient need help planning regular tasks such as shopping or remembering to take medications? [place] / [place] Devices/Equipment: Cane (specify quad or straight), Walker (specify type), Wheelchair (Wheel chair needs legs.)  Prior Device Use: Indicate devices/aids used by the patient prior to current illness, exacerbation or injury? Manual wheelchair, Archivist   Prior Functional Level Current Functional Level  Bed Mobility  Independent  Min assist   Transfers  Independent  Mod assist   Mobility - Walk/Wheelchair  Independent  Other (Unable to ambulate)   Upper Body Dressing  Independent  Other (Supervision)   Lower Body Dressing  Independent  Max assist   Grooming  Independent  Other (Supervision)   Eating/Drinking  Independent  Independent   Toilet Transfer  Independent  Max assist   Bladder Continence   WDL  Does void scant amounts of urine   Bowel  Management  WDL      Stair Climbing  Independent  Other (Unable)   Communication  Intact  Intact   Memory  Intact  Inpaired   Cooking/Meal Prep  Independent      Housework  Independent    Money Management  Independent    Driving  Not driving, had hired help     Special needs/care consideration BiPAP/CPAP No CPM No Continuous Drip IV No Dialysis Yes        Days M-W-F Life Vest No Oxygen No Special Bed No Trach Size No  Wound Vac (area) No     Skin Right knee surgical incision with knee  immobilizer                             Bowel mgmt:  TBD Bladder mgmt: Voids scant amounts Diabetic mgmt Yes, on insulin at home  Previous Home Environment Living Arrangements: Alone  Lives With: Alone Available Help at Discharge: Family, Available 24 hours/day Type of Home: House Home Layout: One level Home Access: Stairs to enter Technical brewer of Steps: 3 steps  Discharge Living Setting Plans for Discharge Living Setting: House, Lives with (comment) (Plans home with daughter here in [place].) Type of Home at Discharge: House Discharge Home Layout: One level Discharge Home Access: Stairs to enter Entrance Stairs-Number of Steps: 3 steps (Will rent or have a ramp built.) Does the patient have any problems obtaining your medications?: No  Social/Family/Support Systems Patient Roles: Parent (Dtr in [place] and a son in [place]) Sport and exercise psychologist Information: [name] Office manager - daughter Anticipated Caregiver: Daughter and family Anticipated Caregiver's Contact Information: [name] - Dtr (c) [phone] (w) [phone] Ability/Limitations of Caregiver: Daughter works FT days as a Tourist information centre manager at [employer].  Dtr will have someone stay with patient after discharge Caregiver Availability: 24/7 Discharge Plan Discussed with Primary Caregiver: Yes Is Caregiver In Agreement with Plan?: Yes Does Caregiver/Family have Issues with Lodging/Transportation while Pt is in Rehab?:  No  Goals/Additional Needs Patient/Family Goal for Rehab: PT/OT supervision and min assist goals Expected length of stay: 14 days Cultural Considerations: [facility] Equipment Needs: TBD Pt/Family Agrees to Admission and willing to participate: Yes Program Orientation Provided & Reviewed with Pt/Caregiver Including Roles  & Responsibilities: Yes  Patient Condition: Patient and daughter known to me from previous inpatient rehab admission 2 years ago.  Patient suffered a right tibia fibula fracture after a fall while on her way to HD.  She was in [place].  She has been receiving PT and OT services.  Daughter pursued inpatient rehab admission at [facility] with plans to take patient to her home here in [place].  I have reviewed all notes with rehab physician and have approval for acute inpatient rehab admission today.  Patient will receive HD in [place] this am and then will be transported to inpatient rehab here in [place].  She will benefit from 3 hours of therapy a day and needs ongoing PT and OT.  She can tolerate inpatient rehab therapies.  Preadmission Screen Completed By:  Retta Diones, 07/12/2015 10:37 AM ______________________________________________________________________   Discussed status with Dr. Posey Pronto on 07/12/15 at 1036 and received telephone approval for admission today.  Admission Coordinator:  Retta Diones, time 1158/Date 07/12/15   Assessment/Plan: Diagnosis: R tibia/fibula fracture, ESRD/HD  1. Does the need for close, 24 hr/day  Medical supervision in concert with the patient's rehab needs make it unreasonable for this patient to be served in a less intensive setting? Yes  2. Co-Morbidities requiring supervision/potential complications: right pilon fracture, end-stage renal disease with hemodialysis, diabetes mellitus, left breast cancer, PAF maintained on aspirin 81 mg, chronic diastolic congestive heart failure, pain management, leukocytosis , Acute on  chronic anemia  3. Due to safety, skin/wound care, disease management, pain management and patient education, does the patient require 24 hr/day rehab nursing? Yes 4. Does the patient require coordinated care of a physician, rehab nurse, PT (1-2 hrs/day, 5 days/week) and OT (1-2 hrs/day, 5 days/week) to address physical and functional deficits in the context of the above medical diagnosis(es)? Potentially Addressing deficits in the following areas: balance,  endurance, locomotion, strength, transferring, bathing, dressing, toileting and psychosocial support 5. Can the patient actively participate in an intensive therapy program of at least 3 hrs of therapy 5 days a week? Yes 6. The potential for patient to make measurable gains while on inpatient rehab is excellent 7. Anticipated functional outcomes upon discharge from inpatients are: min assist and mod assist PT, min assist and mod assist OT, n/a SLP 8. Estimated rehab length of stay to reach the above functional goals is: 12-16 days. 9. Does the patient have adequate social supports to accommodate these discharge functional goals? Yes 10. Anticipated D/C setting: Home 11. Anticipated post D/C treatments: HH therapy and Home excercise program 12. Overall Rehab/Functional Prognosis: excellent    RECOMMENDATIONS: This patient's condition is appropriate for continued rehabilitative care in the following setting: CIR Patient has agreed to participate in recommended program. Yes Note that insurance prior authorization may be required for reimbursement for recommended care.   Retta Diones 07/12/2015   Delice Lesch, MD 07/12/15

## 2015-07-12 NOTE — Interval H&P Note (Signed)
[name] was admitted today to Inpatient Rehabilitation with the diagnosis of comminuted right tibia-fibula fracture status post ORIF 07/07/2015.  The patient's history has been reviewed, patient examined, and there is no change in status.  Patient continues to be appropriate for intensive inpatient rehabilitation.  I have reviewed the patient's chart and labs.  Questions were answered to the patient's satisfaction. The PAPE has been reviewed and assessment remains appropriate.  Ankit Lorie Phenix 07/12/2015, 5:14 PM

## 2015-07-12 NOTE — Significant Event (Signed)
Hypoglycemic Event  CBG: 45  Treatment: 15 GM gel  Symptoms: none  Follow-up CBG: Time:2208 CBG Result:74  Possible Reasons for Event: Unknown  Comments/MD notified:snack and gel given.     [name]

## 2015-07-12 NOTE — H&P (Signed)
Physical Medicine and Rehabilitation Admission H&P     HPI: 82 year old right-handed female well known to rehabilitation services for history of right pilon fracture received inpatient rehabilitation services 2 years ago and was discharged home with daughter. Past medical history of end-stage renal disease with hemodialysis, diabetes mellitus, left breast cancer, PAF maintained on aspirin 81 mg, chronic diastolic congestive heart failure. Patient lives in [place] alone. One level home 3 steps to entry and used a walker and sometimes a cane prior to admission. 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 [place] 07/05/2015 [place] after mechanical fall without loss of consciousness. Sustained comminuted fracture right tibia-fibula. Underwent ORIF 07/07/2015 per Dr. Pollyann Glen. Nonweightbearing with knee immobilizer when out of bed. Hospital course pain management. Hemodialysis ongoing as per renal services. Placed on subcutaneous heparin for DVT prophylaxis bouts of leukocytosis 14,000 felt to be reactive workup was unremarkable. Acute on chronic anemia 10.1 and monitor. Physical occupational therapy evaluations completed ongoing requiring minimal assist for mobility patient was admitted for comprehensive rehabilitation program  Review of Systems  Constitutional: Negative for fever and chills.  HENT: Negative for hearing loss.   Eyes: Negative for blurred vision and double vision.  Respiratory: Negative for cough and shortness of breath.   Cardiovascular: Positive for leg swelling. Negative for chest pain.  Gastrointestinal: Positive for constipation. Negative for nausea and vomiting.  Genitourinary: Negative for dysuria.  Musculoskeletal: Positive for myalgias and falls.  Skin: Negative for rash.  Neurological: Negative for seizures and headaches.  All other systems reviewed and are  negative.  Past Medical History  Diagnosis Date  . CHF (congestive heart failure)   . DM2 (diabetes mellitus, type 2)   . ESRD (end stage renal disease)     [facility] MWF [facility]  . HTN (hypertension)   . Hyperlipidemia   . Tibia/fibula fracture 07/10/2011    right - nonunion repair 06/2012  . Hypothyroid   . Breast cancer   . Arthritis   . Hyperparathyroidism, secondary renal   . Shortness of breath   . Atrial fibrillation dx 06/2012    on coumadin  . Atrial fibrillation 06/25/2012    2D Echo - EF 45-50, left atrium severely dilated, mitral valve heavily calcified annulus with calcification of the subvalvular apparatus   Past Surgical History  Procedure Laterality Date  . Mastectomy    . Abdominal surgery    . Thyroid surgery    . Breast surgery Left     Mastectomy  . Eye surgery Bilateral     Cataract  . Ankle fusion Right   . Arteriovenous graft placement Right   . Orif tibia fracture Right 06/16/2012    Procedure: OPEN REDUCTION INTERNAL FIXATION (ORIF) TIBIA FRACTURE;  Surgeon: Rozanna Box, MD;  Location: [facility];  Service: Orthopedics;  Laterality: Right;   Family History  Problem Relation Age of Onset  . Hyperlipidemia Father   . Diabetes Father   . Breast cancer Sister    Social History:  reports that she has never smoked. Her smokeless tobacco use includes Snuff. She reports that she does not drink alcohol or use illicit drugs. Allergies:  Allergies  Allergen Reactions  . Codeine Swelling  . Coreg [Carvedilol] Other (See Comments)    Headaches  . Lopressor [Metoprolol Tartrate] Other (See Comments)    GI upset    (Not in a hospital admission)  Home:  lives alone  in [facility] independently with a cane walker prior to admission. One level home 3 steps to entry. Patient plans to stay with her daughter in [place] upon discharge who also has a 1 level home   Functional History:  independent with cane walker prior to admission  Functional  Status:  Mobility:  moderate assist sit to stand. Moderate assist for functional mobility        ADL:  mod max assist activities of daily living  Cognition:      Physical Exam: 110/70 pulse 80 respirations 18 temperature 98 Physical Exam  Vitals reviewed. Constitutional: She is oriented to person, place, and time. She appears well-developed and well-nourished.  HENT:  Head: Normocephalic and atraumatic.  Eyes: Conjunctivae and EOM are normal.  Neck: Normal range of motion. Neck supple. No thyromegaly present.  Cardiovascular:  Irregular irregular RUE fistula  Respiratory: Effort normal and breath sounds normal. No respiratory distress.  GI: Soft. Bowel sounds are normal. She exhibits no distension.  Musculoskeletal: She exhibits edema and tenderness.  RLE in knee immobilizer  Neurological: She is alert and oriented to person, place, and time.  A&Ox2 DTRs symmetric Sensation intact to light touch Motor: B/l UE 4/5 proximal to distal LLE: 3- proximally, 4/5 distally RLE: hip flexion 2/5, wiggles toes  Skin:  RLE dressing c/d/i  Psychiatric: She has a normal mood and affect. Her behavior is normal.    No results found for this or any previous visit (from the past 48 hour(s)). No results found.     Medical Problem List and Plan: 1.  Limited functional mobility secondary to comminuted right tibia-fibula fracture. Status post ORIF 07/07/2015. Nonweightbearing. Knee immobilizer when out of bed 2.  DVT Prophylaxis/Anticoagulation: Subcutaneous heparin. Monitor platelet counts and any signs of bleeding 3. Pain Management: Oxycodone 5-10 mg every 4 hours as needed 4. Acute blood loss anemia. Follow-up CBC 5. Neuropsych: This patient is capable of making decisions on her own behalf. 6. Skin/Wound Care: Routine skin checks 7. Fluids/Electrolytes/Nutrition: Routine I&O's with follow-up chemistries 8. End-stage renal disease with hemodialysis. Renal service contacted to  resume hemodialysis 9. PAF. Aspirin 325 mg daily. Cardiac rate control 10. Hypertension. Hydralazine 50 mg twice a day, Imdur 30 mg daily 11. Diastolic congestive heart failure. Monitor for any signs of fluid overload 12. Diabetes mellitus. Check blood sugars before meals and at bedtime. Sliding scale insulin. We'll confirm home diabetic regimen with family 1. Hypothyroidism. Synthroid 75 g daily 14. Hyperlipidemia. Pravachol  Post Admission Physician Evaluation: 1. Functional deficits secondary  to comminuted right tibia-fibula fracture status post ORIF 07/07/2015. 2. Patient is admitted to receive collaborative, interdisciplinary care between the physiatrist, rehab nursing staff, and therapy team. 3. Patient's level of medical complexity and substantial therapy needs in context of that medical necessity cannot be provided at a lesser intensity of care such as a SNF. 4. Patient has experienced substantial functional loss from his/her baseline which was documented above under the "Functional History" and "Functional Status" headings.  Judging by the patient's diagnosis, physical exam, and functional history, the patient has potential for functional progress which will result in measurable gains while on inpatient rehab.  These gains will be of substantial and practical use upon discharge  in facilitating mobility and self-care at the household level. 5. Physiatrist will provide 24 hour management of medical needs as well as oversight of the therapy plan/treatment and provide guidance as appropriate regarding the interaction of the two. 6. 24 hour rehab nursing will assist  with bladder management, safety, skin/wound care, disease management, medication administration, pain management and patient education  and help integrate therapy concepts, techniques,education, etc. 7. PT will assess and treat for/with: Lower extremity strength, range of motion, stamina, balance, functional mobility, safety,  adaptive techniques and equipment, woundcare, coping skills, pain control, education.   Goals are: Min/Mod A. 8. OT will assess and treat for/with: ADL's, functional mobility, safety, upper extremity strength, adaptive techniques and equipment, wound mgt, ego support, and community reintegration.   Goals are: Min/Mod A. Therapy may not proceed with showering this patient. 9. Case Management and Social Worker will assess and treat for psychological issues and discharge planning. 10. Team conference will be held weekly to assess progress toward goals and to determine barriers to discharge. 11. Patient will receive at least 3 hours of therapy per day at least 5 days per week. 12. ELOS: 11-15 days.       13. Prognosis:  excellent   Delice Lesch, MD 07/12/2015

## 2015-07-13 ENCOUNTER — Inpatient Hospital Stay (HOSPITAL_COMMUNITY): Payer: Medicare Other | Admitting: Occupational Therapy

## 2015-07-13 ENCOUNTER — Inpatient Hospital Stay (HOSPITAL_COMMUNITY): Payer: Medicare Other

## 2015-07-13 DIAGNOSIS — R609 Edema, unspecified: Secondary | ICD-10-CM

## 2015-07-13 LAB — CBC WITH DIFFERENTIAL/PLATELET
BASOS PCT: 0 %
Basophils Absolute: 0 10*3/uL (ref 0.0–0.1)
EOS ABS: 0.2 10*3/uL (ref 0.0–0.7)
EOS PCT: 2 %
HCT: 29.7 % — ABNORMAL LOW (ref 36.0–46.0)
HEMOGLOBIN: 9.6 g/dL — AB (ref 12.0–15.0)
Lymphocytes Relative: 17 %
Lymphs Abs: 1.7 10*3/uL (ref 0.7–4.0)
MCH: 31.1 pg (ref 26.0–34.0)
MCHC: 32.3 g/dL (ref 30.0–36.0)
MCV: 96.1 fL (ref 78.0–100.0)
Monocytes Absolute: 1.4 10*3/uL — ABNORMAL HIGH (ref 0.1–1.0)
Monocytes Relative: 14 %
NEUTROS PCT: 67 %
Neutro Abs: 6.6 10*3/uL (ref 1.7–7.7)
PLATELETS: 292 10*3/uL (ref 150–400)
RBC: 3.09 MIL/uL — AB (ref 3.87–5.11)
RDW: 15.5 % (ref 11.5–15.5)
WBC: 9.9 10*3/uL (ref 4.0–10.5)

## 2015-07-13 LAB — COMPREHENSIVE METABOLIC PANEL
ALBUMIN: 2.3 g/dL — AB (ref 3.5–5.0)
ALK PHOS: 282 U/L — AB (ref 38–126)
ALT: 12 U/L — ABNORMAL LOW (ref 14–54)
ANION GAP: 13 (ref 5–15)
AST: 77 U/L — ABNORMAL HIGH (ref 15–41)
BUN: 27 mg/dL — ABNORMAL HIGH (ref 6–20)
CALCIUM: 9.1 mg/dL (ref 8.9–10.3)
CHLORIDE: 95 mmol/L — AB (ref 101–111)
CO2: 30 mmol/L (ref 22–32)
Creatinine, Ser: 4.03 mg/dL — ABNORMAL HIGH (ref 0.44–1.00)
GFR calc Af Amer: 11 mL/min — ABNORMAL LOW (ref >60)
GFR calc non Af Amer: 9 mL/min — ABNORMAL LOW (ref >60)
GLUCOSE: 242 mg/dL — AB (ref 65–99)
POTASSIUM: 4.1 mmol/L (ref 3.5–5.1)
SODIUM: 138 mmol/L (ref 135–145)
Total Bilirubin: 0.7 mg/dL (ref 0.3–1.2)
Total Protein: 6.3 g/dL — ABNORMAL LOW (ref 6.5–8.1)

## 2015-07-13 LAB — GLUCOSE, CAPILLARY
GLUCOSE-CAPILLARY: 67 mg/dL (ref 65–99)
GLUCOSE-CAPILLARY: 74 mg/dL (ref 65–99)
GLUCOSE-CAPILLARY: 306 mg/dL — AB (ref 65–99)
Glucose-Capillary: 218 mg/dL — ABNORMAL HIGH (ref 65–99)
Glucose-Capillary: 110 mg/dL — ABNORMAL HIGH (ref 65–99)

## 2015-07-13 MED ORDER — NEPRO/CARBSTEADY PO LIQD
237.0000 mL | Freq: Two times a day (BID) | ORAL | Status: DC
  Administered 2015-07-13 – 2015-07-19 (×9): 237 mL via ORAL

## 2015-07-13 NOTE — Progress Notes (Signed)
VASCULAR LAB PRELIMINARY  PRELIMINARY  PRELIMINARY  PRELIMINARY  Bilateral lower extremity venous duplex.vll completed.     Bilateral:  No evidence of DVT, superficial thrombosis, or Baker's Cyst.  Exam was limited of the right leg due to leg wraps.  Janifer Adie, RVT, RDMS 07/13/2015, 3:49 PM

## 2015-07-13 NOTE — Evaluation (Addendum)
Occupational Therapy Assessment and Plan  Patient Details  Name: [name] MRN: [id number] Date of Birth: 07-01-1928  OT Diagnosis: acute pain, muscle weakness (generalized) and pain in joint Rehab Potential: Rehab Potential (ACUTE ONLY): Good ELOS: 12-16 days   Today's Date: 07/13/2015 OT Individual Time: 0930-1030  OT Individual Time Calculation (min): 60 min     Problem List:  Patient Active Problem List   Diagnosis Date Noted  . Hip fracture ([place]) 07/12/2015  . Swelling   . Post-operative pain   . Acute blood loss anemia   . ESRD on dialysis ([place])   . PAF (paroxysmal atrial fibrillation) ([place])   . Benign essential HTN   . Chronic diastolic congestive heart failure ([place])   . Diabetes mellitus type 2 in nonobese ([facility])   . HLD (hyperlipidemia)   . Thyroid activity decreased   . Elevated brain natriuretic peptide (BNP) level 01/06/2014  . Chest pain, atypical 08/13/2012  . Long term (current) use of anticoagulants 07/30/2012  . Chronic combined systolic and diastolic heart failure EF 45-50% by echo 06/25/2012 07/30/2012  . Breast cancer, s/p Lt mastectomy 07/22/2012  . Chronic anticoagulation 07/22/2012  . Acute on chronic systolic and diastolic heart failure, NYHA class 4 ([place]) 07/20/2012  . Pleural effusion due to CHF (congestive heart failure) ([place]) 07/20/2012  . Atrial fibrillation, spont convers NSR 06/26/12 06/25/2012  . Hypotension, unspecified 06/25/2012  . Diastolic CHF, chronic ([place]) 06/25/2012  . Right tibial fracture 06/21/2012  . Metabolic bone disease 68/02/7516  . Secondary hyperparathyroidism of renal origin ([place]) 06/19/2012  . Nonunion of fracture, R tibia 06/17/2012  . H/O radioactive iodine thyroid ablation   . Closed fracture of right fibula and tibia 07/10/2011  . ESRD (end stage renal disease) ([place]) 07/10/2011  . HTN (hypertension) 07/10/2011  . DM2 (diabetes mellitus, type 2) ([place]) 07/10/2011  . Hyperlipidemia 07/10/2011  . Anemia 07/10/2011     Past Medical History:  Past Medical History  Diagnosis Date  . CHF (congestive heart failure) ([place])   . DM2 (diabetes mellitus, type 2) ([place])   . ESRD (end stage renal disease) ([place])     [facility] MWF [facility]  . HTN (hypertension)   . Hyperlipidemia   . Tibia/fibula fracture 07/10/2011    right - nonunion repair 06/2012  . Hypothyroid   . Breast cancer ([place])   . Arthritis   . Hyperparathyroidism, secondary renal ([place])   . Shortness of breath   . Atrial fibrillation ([place]) dx 06/2012    on coumadin  . Atrial fibrillation ([place]) 06/25/2012    2D Echo - EF 45-50, left atrium severely dilated, mitral valve heavily calcified annulus with calcification of the subvalvular apparatus   Past Surgical History:  Past Surgical History  Procedure Laterality Date  . Mastectomy    . Abdominal surgery    . Thyroid surgery    . Breast surgery Left     Mastectomy  . Eye surgery Bilateral     Cataract  . Ankle fusion Right   . Arteriovenous graft placement Right   . Orif tibia fracture Right 06/16/2012    Procedure: OPEN REDUCTION INTERNAL FIXATION (ORIF) TIBIA FRACTURE;  Surgeon: Rozanna Box, MD;  Location: [facility];  Service: Orthopedics;  Laterality: Right;    Assessment & Plan Clinical Impression: 82 year old right-handed female well known to rehabilitation services for history of right pilon fracture received inpatient rehabilitation services 2 years ago and was discharged home with daughter. Past medical history of end-stage renal  disease with hemodialysis, diabetes mellitus, left breast cancer, PAF maintained on aspirin 81 mg, chronic diastolic congestive heart failure. Patient lives in [place] alone. One level home 3 steps to entry and used a walker and sometimes a cane prior to admission. 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 [place] 07/05/2015 [place] after mechanical fall without loss of  consciousness. Sustained comminuted fracture right tibia-fibula. Underwent ORIF 07/07/2015 per Dr. Pollyann Glen. Nonweightbearing with knee immobilizer when out of bed. Hospital course pain management. Hemodialysis ongoing as per renal services. Placed on subcutaneous heparin for DVT prophylaxis bouts of leukocytosis 14,000 felt to be reactive workup was unremarkable. Acute on chronic anemia 10.1 and monitor. Physical occupational therapy evaluations completed ongoing requiring minimal assist for mobility patient was admitted for comprehensive rehabilitation program  Patient transferred to CIR on 07/12/2015 .    Patient currently requires max with basic self-care skills secondary to muscle weakness, decreased cardiorespiratoy endurance, decreased awareness, decreased problem solving, decreased safety awareness and decreased memory and decreased standing balance, decreased balance strategies and difficulty maintaining precautions.  Prior to hospitalization, patient could complete ADLs with modified independent .  Patient will benefit from skilled intervention to decrease level of assist with basic self-care skills, increase independence with basic self-care skills and increase level of independence with iADL prior to discharge. Pt lives alone, however, plans to d/c to daughter's home until more independent.  Anticipate patient will require 24 hour supervision and minimal physical assistance and follow up home health.  Skilled Therapeutic Intervention Pt seen for OT eval and ADL bathing/dressing session. Pt sitting up in w/c upon arrival, agreeable to tx session. She voiced pain 2/10 while sitting still raising to "11/10" upon standing. She completed bathing/dressing w/c level at sink. She required max A to stand at [facility] while therapist provided assist for hygiene and clothing management. She tolerated ~15 seconds of static standing before requiring seated rest break.  Pt requested return to supine at end of  session. Max A stand pivot transfer completed to EOB with verbal and tactile cuing for proper use of RW. Pt left in supine, all needs in reach and bed alarm on. Pt educated throughout session regarding role of OT, OT goals, POC, and d/c planning.   OT Evaluation Precautions/Restrictions  Precautions Precautions: Fall Precaution Comments: HD MWF Required Braces or Orthoses: Knee Immobilizer - Right Knee Immobilizer - Right: On when out of bed or walking Restrictions Weight Bearing Restrictions: Yes RLE Weight Bearing: Non weight bearing General Chart Reviewed: Yes Home Living/Prior Functioning Home Living Family/patient expects to be discharged to:: Private residence Living Arrangements: Children Available Help at Discharge: Family, Available 24 hours/day Type of Home: House Home Access: Stairs to enter [employer] of Steps: 3 steps Home Layout: One level Bathroom Shower/Tub: Tub/shower unit Additional Comments: Planning to discharge to daughter's home in [place] upon d/c (info above is daughters home). Difficult to determine bathroom layout/ accessibility based on pt's description  Lives With: Family (Lived alone PTA, plans to d/c to daughter's home) IADL History Homemaking Responsibilities: Yes Meal Prep Responsibility: Primary Laundry Responsibility: Primary Cleaning Responsibility: Primary Current License: No Prior Function Level of Independence: Independent with basic ADLs, Independent with homemaking with ambulation, Independent with transfers, Requires assistive device for independence  Able to Take Stairs?: Yes Driving: No Comments: used transportation for HD and friend drove her to grocery store Vision/Perception  Vision- History Baseline Vision/History: Wears glasses Wears Glasses:  At all times Patient Visual Report: No change from baseline Vision- Assessment Vision Assessment?: No apparent visual deficits  Cognition Overall Cognitive Status:  Impaired/Different from baseline Arousal/Alertness: Awake/alert Orientation Level: Person;Place;Situation Person: Oriented Situation: Oriented Year: 2017 Month: May Day of Week: Incorrect Memory: Impaired Memory Impairment: Decreased recall of new information;Decreased short term memory Decreased Short Term Memory: Verbal complex;Functional complex Immediate Memory Recall: Sock;Blue;Bed Memory Recall: Blue;Bed Memory Recall Blue: Without Cue Memory Recall Bed: With Cue Awareness: Impaired Awareness Impairment: Anticipatory impairment Problem Solving: Impaired Problem Solving Impairment: Verbal complex;Functional complex Safety/Judgment: Appears intact Sensation Sensation Light Touch: Appears Intact Coordination Gross Motor Movements are Fluid and Coordinated: No Coordination and Movement Description: Impaired due to pain  Motor  Motor Motor: Other (comment) Motor - Skilled Clinical Observations: Generalized weakness and surgical pain Trunk/Postural Assessment  Cervical Assessment Cervical Assessment: Within Functional Limits Thoracic Assessment Thoracic Assessment: Exceptions to [facility] (Kyphotic) Lumbar Assessment Lumbar Assessment: Exceptions to [facility] (Posterior pelvic tilt) Postural Control Postural Control: Deficits on evaluation (L lateral lean due to NWB on R LE)  Balance Balance Balance Assessed: Yes Static Sitting Balance Static Sitting - Level of Assistance: 5: Stand by assistance Dynamic Sitting Balance Dynamic Sitting - Level of Assistance: 4: Min assist Static Standing Balance Static Standing - Level of Assistance: 3: Mod assist Dynamic Standing Balance Dynamic Standing - Level of Assistance: 2: Max assist Dynamic Standing - Comments: Standing to complete LB dressing tasks Extremity/Trunk Assessment RUE Assessment RUE Assessment: Exceptions to WFL (4-/5 overall) LUE Assessment LUE Assessment: Exceptions to WFL (4-/5 overall)   See Function Navigator for  Current Functional Status.   Refer to Care Plan for Long Term Goals  Recommendations for other services: None  Discharge Criteria: Patient will be discharged from OT if patient refuses treatment 3 consecutive times without medical reason, if treatment goals not met, if there is a change in medical status, if patient makes no progress towards goals or if patient is discharged from hospital.  The above assessment, treatment plan, treatment alternatives and goals were discussed and mutually agreed upon: by patient  Ernestina Patches 07/13/2015, 4:15 PM

## 2015-07-13 NOTE — Evaluation (Addendum)
Physical Therapy Assessment and Plan  Patient Details  Name: [name] MRN: [id number] Date of Birth: 10-09-1928  PT Diagnosis: Difficulty walking, Edema, Impaired cognition, Muscle weakness and Pain in joint Rehab Potential: Good ELOS: 12-16 days   Today's Date: 07/13/2015 PT Individual Time: 0800-0900 PT Individual Time Calculation (min): 60 min    Problem List:  Patient Active Problem List   Diagnosis Date Noted  . Hip fracture ([place]) 07/12/2015  . Swelling   . Post-operative pain   . Acute blood loss anemia   . ESRD on dialysis ([place])   . PAF (paroxysmal atrial fibrillation) ([place])   . Benign essential HTN   . Chronic diastolic congestive heart failure ([place])   . Diabetes mellitus type 2 in nonobese (H[facility])   . HLD (hyperlipidemia)   . Thyroid activity decreased   . Elevated brain natriuretic peptide (BNP) level 01/06/2014  . Chest pain, atypical 08/13/2012  . Long term (current) use of anticoagulants 07/30/2012  . Chronic combined systolic and diastolic heart failure EF 45-50% by echo 06/25/2012 07/30/2012  . Breast cancer, s/p Lt mastectomy 07/22/2012  . Chronic anticoagulation 07/22/2012  . Acute on chronic systolic and diastolic heart failure, NYHA class 4 ([place]) 07/20/2012  . Pleural effusion due to CHF (congestive heart failure) ([place]) 07/20/2012  . Atrial fibrillation, spont convers NSR 06/26/12 06/25/2012  . Hypotension, unspecified 06/25/2012  . Diastolic CHF, chronic ([place]) 06/25/2012  . Right tibial fracture 06/21/2012  . Metabolic bone disease 73/41/9379  . Secondary hyperparathyroidism of renal origin ([place]) 06/19/2012  . Nonunion of fracture, R tibia 06/17/2012  . H/O radioactive iodine thyroid ablation   . Closed fracture of right fibula and tibia 07/10/2011  . ESRD (end stage renal disease) ([place]) 07/10/2011  . HTN (hypertension) 07/10/2011  . DM2 (diabetes mellitus, type 2) ([place]) 07/10/2011  . Hyperlipidemia 07/10/2011  . Anemia 07/10/2011    Past  Medical History:  Past Medical History  Diagnosis Date  . CHF (congestive heart failure) ([place])   . DM2 (diabetes mellitus, type 2) ([place])   . ESRD (end stage renal disease) ([place])     HD MWF - [facility]  . HTN (hypertension)   . Hyperlipidemia   . Tibia/fibula fracture 07/10/2011    right - nonunion repair 06/2012  . Hypothyroid   . Breast cancer ([place])   . Arthritis   . Hyperparathyroidism, secondary renal ([place])   . Shortness of breath   . Atrial fibrillation ([place]) dx 06/2012    on coumadin  . Atrial fibrillation ([place]) 06/25/2012    2D Echo - EF 45-50, left atrium severely dilated, mitral valve heavily calcified annulus with calcification of the subvalvular apparatus   Past Surgical History:  Past Surgical History  Procedure Laterality Date  . Mastectomy    . Abdominal surgery    . Thyroid surgery    . Breast surgery Left     Mastectomy  . Eye surgery Bilateral     Cataract  . Ankle fusion Right   . Arteriovenous graft placement Right   . Orif tibia fracture Right 06/16/2012    Procedure: OPEN REDUCTION INTERNAL FIXATION (ORIF) TIBIA FRACTURE;  Surgeon: Rozanna Box, MD;  Location: [facility];  Service: Orthopedics;  Laterality: Right;    Assessment & Plan Clinical Impression: Patient is a 82 y.o. year old female well known to rehabilitation services for history of right pilon fracture received inpatient rehabilitation services 2 years ago and was discharged home with daughter. Past medical history of end-stage  renal disease with hemodialysis, diabetes mellitus, left breast cancer, PAF maintained on aspirin 81 mg, chronic diastolic congestive heart failure. Patient lives in [place] alone. One level home 3 steps to entry and used a walker and sometimes a cane prior to admission. 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 [place] 07/05/2015 [place] after mechanical fall without loss of  consciousness. Sustained comminuted fracture right tibia-fibula. Underwent ORIF 07/07/2015 per Dr. Pollyann Glen. Nonweightbearing with knee immobilizer when out of bed. Hospital course pain management. Hemodialysis ongoing as per renal services. Placed on subcutaneous heparin for DVT prophylaxis bouts of leukocytosis 14,000 felt to be reactive workup was unremarkable. Acute on chronic anemia 10.1 and monitor. Physical occupational therapy evaluations completed ongoing requiring minimal assist for mobility patient was admitted for comprehensive rehabilitation program.  Patient transferred to CIR on 07/12/2015 .   Patient currently requires max with mobility secondary to muscle weakness and muscle joint tightness, decreased cardiorespiratoy endurance and decreased sitting balance, decreased standing balance, decreased postural control, decreased balance strategies and difficulty maintaining precautions.  Prior to hospitalization, patient was modified independent  with mobility and lived with Family (Lived alone PTA; will stay with daughter at d/c) in a House home.  Home access is 3 stepsStairs to enter.  Patient will benefit from skilled PT intervention to maximize safe functional mobility, minimize fall risk and decrease caregiver burden for planned discharge home with 24 hour supervision.  Anticipate patient will benefit from follow up [place] at discharge.  PT - End of Session Activity Tolerance: Decreased this session Endurance Deficit: Yes Endurance Deficit Description: pain very limiting PT Assessment Rehab Potential (ACUTE/IP ONLY): Good Barriers to Discharge: Inaccessible home environment (will need ramp) PT Patient demonstrates impairments in the following area(s): Balance;Edema;Endurance;Motor;Pain;Skin Integrity PT Transfers Functional Problem(s): Bed Mobility;Bed to Chair;Car;Furniture PT Locomotion Functional Problem(s): Ambulation;Wheelchair Mobility;Stairs PT Plan PT Intensity: Minimum of  1-2 x/day ,45 to 90 minutes PT Frequency: 5 out of 7 days PT Duration Estimated Length of Stay: 12-16 days PT Treatment/Interventions: Ambulation/gait training;Balance/vestibular training;Community reintegration;Discharge planning;Disease management/prevention;DME/adaptive equipment instruction;Functional mobility training;Neuromuscular re-education;Pain management;Patient/family education;Psychosocial support;Skin care/wound management;Splinting/orthotics;Stair training;Therapeutic Activities;Therapeutic Exercise;UE/LE Strength taining/ROM;UE/LE Coordination activities;Wheelchair propulsion/positioning PT Transfers Anticipated Outcome(s): supervision basic; min assist car PT Locomotion Anticipated Outcome(s): mod i w/c mobility; min assist short distance gait PT Recommendation Follow Up Recommendations: Home health PT;24 hour supervision/assistance Patient destination: Home (daughter's home) Equipment Recommended:  (elevating legrests for pt's personal w/c) Equipment Details: pt has cane, RW, and w/c   Skilled Therapeutic Intervention Individual treatment initiated with focus on orientation to rehab and PT POC/goals, bed mobility, transfers, education, w/c propulsion and simulated car transfer. Pt required min assist for bed mobility due to PT managing RLE. Pt with minimal active movement in RLE due to pain and this limits overall mobility. Performed max assist transfers throughout session with cues for technique and hand placement in order to maintain NWB status (pt unable to recall NWB status initially). Educated on use of [place] for when OOB but MD reports no ROM restrictions. Propelled w/c at supervision level with extra time and requires assist for legrest management. End of session left up in w/c with all needs in reach.   PT Evaluation Precautions/Restrictions Precautions Precautions: Fall Precaution Comments: HD MWF Required Braces or Orthoses: Knee Immobilizer - Right Knee Immobilizer -  Right: On when out of bed or walking Restrictions Weight Bearing Restrictions: Yes RLE Weight Bearing: Non weight bearing Pain  c/o 9/10 pain in RLE as well as some arthritic pain in bilateral hands - premedicated. Home Living/Prior Functioning Home Living Available Help at Discharge: Family;Available 24 hours/day Type of Home: House Home Access: Stairs to enter [employer] of Steps: 3 steps Home Layout: One level Additional Comments: Planning to discharge to daughter's home in [place] upon d/c (info above is daughters home). Will need ramp for home access  Lives With: Family (Lived alone PTA; will stay with daughter at d/c) Prior Function Level of Independence: Independent with basic ADLs;Independent with homemaking with ambulation;Independent with transfers;Requires assistive device for independence  Able to Take Stairs?: Yes Comments: used transportation for HD Vision/Perception  Vision - History Baseline Vision: Wears glasses all the time Praxis Praxis: Intact  Cognition Memory: Impaired Memory Impairment: Decreased recall of new information;Decreased short term memory Sensation Sensation Light Touch: Appears Intact Coordination Gross Motor Movements are Fluid and Coordinated: No (RLE) Motor  Motor Motor: Other (comment) (generalized and post-op pain/weakness)     Trunk/Postural Assessment  Cervical Assessment Cervical Assessment: Within Functional Limits Thoracic Assessment Thoracic Assessment:  (kyphotic) Lumbar Assessment Lumbar Assessment:  (posterior pelvic tilt) Postural Control Postural Control: Deficits on evaluation (L lateral lean in standing due to NWB RLE)  Balance Balance Balance Assessed: Yes Static Sitting Balance Static Sitting - Level of Assistance: 5: Stand by assistance Dynamic Sitting Balance Dynamic Sitting - Level of Assistance: 4: Min assist Static Standing Balance Static Standing - Level of Assistance: 3: Mod  assist Dynamic Standing Balance Dynamic Standing - Level of Assistance: 2: Max assist Extremity Assessment   See OT Eval for UE assessment   RLE Assessment RLE Assessment: Exceptions to [facility] RLE Strength RLE Overall Strength Comments: limited assessment due to pain; minimal hip and knee flexion allowed; resting knee flexion; ankle grossly 3 to 3-/5 ; edema also limiting movement LLE Assessment LLE Assessment: Within Functional Limits (grossly 4/5)   See Function Navigator for Current Functional Status.   Refer to Care Plan for Long Term Goals  Recommendations for other services: None  Discharge Criteria: Patient will be discharged from PT if patient refuses treatment 3 consecutive times without medical reason, if treatment goals not met, if there is a change in medical status, if patient makes no progress towards goals or if patient is discharged from hospital.  The above assessment, treatment plan, treatment alternatives and goals were discussed and mutually agreed upon: by patient     Session #2: 1305-1345 (40 min) Individual therapy; Reports pain is ok at rest. Difficulty with recall of what she did in therapies this morning - reoriented and education provided. Instructed in supine therex with active assisted ROM and strengthening to RLE including ankle pumps, heel slides, quad sets, and hip abduction/adduction x 15 reps each. Min A for bed mobility with verbal cues for sequencing and performed max assist transfer to w/c with stand pivot technique. W/c propulsion in hallway with supervision x 100' to tolerance for functional UE strengthening and endurance.   Canary Brim Ivory Broad, PT, DPT  07/13/2015, 1:49 PM

## 2015-07-13 NOTE — Progress Notes (Signed)
PHYSICAL MEDICINE & REHABILITATION     PROGRESS NOTE    Subjective/Complaints: Had a good night. Right leg pain controlled. Both of her thumbs tender at bases  ROS: Pt denies fever, rash/itching, headache, blurred or double vision, nausea, vomiting, abdominal pain, diarrhea, chest pain, shortness of breath, palpitations, dysuria, dizziness, neck or back pain, bleeding, anxiety, or depression  Objective: Vital Signs: Blood pressure 126/35, pulse 65, temperature 98.6 F (37 C), temperature source Oral, resp. rate 18, height 5\' 1"  (1.549 m), weight 60.3 kg (132 lb 15 oz), SpO2 100 %. No results found.  Recent Labs  07/12/15 1555 07/13/15 0438  WBC 10.1 9.9  HGB 10.7* 9.6*  HCT 33.7* 29.7*  PLT 281 292    Recent Labs  07/12/15 1555 07/13/15 0438  NA  --  138  K  --  4.1  CL  --  95*  GLUCOSE  --  242*  BUN  --  27*  CREATININE 3.00* 4.03*  CALCIUM  --  9.1   CBG (last 3)   Recent Labs  07/12/15 2132 07/12/15 2208 07/13/15 0643  GLUCAP 45* 74 218*    Wt Readings from Last 3 Encounters:  07/13/15 60.3 kg (132 lb 15 oz)  07/11/15 66.225 kg (146 lb)  01/06/14 66.996 kg (147 lb 11.2 oz)    Physical Exam:  Constitutional: She is oriented to person, place, and time. She appears well-developed and well-nourished.  HENT:  Head: Normocephalic and atraumatic.  Eyes: Conjunctivae and EOM are normal.  Neck: Normal range of motion. Neck supple. No thyromegaly present.  Cardiovascular:  Irregular irregular RUE fistula  Respiratory: Effort normal and breath sounds normal. No respiratory distress.  GI: Soft. Bowel sounds are normal. She exhibits no distension.  Musculoskeletal: She exhibits edema and tenderness. Both cmc's tender RLE in knee immobilizer  Neurological: She is alert and oriented to person, place, and time.  A&Ox2 DTRs symmetric Sensation intact to light touch Motor: B/l UE 4/5 proximal to distal LLE: 3- proximally, 4/5 distally RLE: hip  flexion 2/5, wiggles toes  Skin:  RLE dressing c/d/i. Leg slightly swollen/red Psychiatric: She has a normal mood and affect. Her behavior is normal.     Assessment/Plan: 1. Functional deficits secondary to right tib/fib fx which require 3+ hours per day of interdisciplinary therapy in a comprehensive inpatient rehab setting. Physiatrist is providing close team supervision and 24 hour management of active medical problems listed below. Physiatrist and rehab team continue to assess barriers to discharge/monitor patient progress toward functional and medical goals.  Function:  Bathing Bathing position      Bathing parts      Bathing assist        Upper Body Dressing/Undressing Upper body dressing                    Upper body assist        Lower Body Dressing/Undressing Lower body dressing                                  Lower body assist        Toileting Toileting   Toileting steps completed by patient: Adjust clothing prior to toileting, Performs perineal hygiene, Adjust clothing after toileting   Toileting Assistive Devices: Grab bar or rail  Toileting assist Assist level: Touching or steadying assistance (Pt.75%)   Transfers Chair/bed transfer  Locomotion Ambulation           Wheelchair          Cognition Comprehension Comprehension assist level: Follows basic conversation/direction with no assist  Expression Expression assist level: Expresses basic needs/ideas: With no assist  Social Interaction Social Interaction assist level: Interacts appropriately with others with medication or extra time (anti-anxiety, antidepressant).  Problem Solving Problem solving assist level: Solves basic problems with no assist  Memory Memory assist level: Requires cues to use assistive device    Medical Problem List and Plan: 1. Limited functional mobility secondary to comminuted right tibia-fibula fracture. Status post ORIF  07/07/2015. Nonweightbearing. Knee immobilizer when out of bed. No ROM precautions 2. DVT Prophylaxis/Anticoagulation: Subcutaneous heparin. Monitor platelet counts and any signs of bleeding 3. Pain Management: Oxycodone 5-10 mg every 4 hours as needed  -add voltaren gel for bilateral hand OA 4. Acute blood loss anemia. hgb 9.6. Fe++ supp 5. Neuropsych: This patient is capable of making decisions on her own behalf. 6. Skin/Wound Care: Routine skin checks 7. Fluids/Electrolytes/Nutrition: Routine I&O's with follow-up chemistries reviewed. Near baseline 8. End-stage renal disease with hemodialysis. HD each day after therapy day. 9. PAF. Aspirin 325 mg daily. Cardiac rate control 10. Hypertension. Hydralazine 50 mg twice a day, Imdur 30 mg daily 11. Diastolic congestive heart failure. Monitor for any signs of fluid overload 12. Diabetes mellitus. Checking blood sugars before meals and at bedtime. Sliding scale insulin.   -sugars up and down right now. Follow for pattern 13. Hypothyroidism. Synthroid 75 g daily 14. Hyperlipidemia. Pravachol  LOS (Days) 1 A FACE TO FACE EVALUATION WAS PERFORMED  Santiaga Butzin T 07/13/2015 8:11 AM

## 2015-07-13 NOTE — Progress Notes (Signed)
Occupational Therapy Session Note  Patient Details  Name: [name] MRN: [id number] Date of Birth: 13-Aug-1928  Today's Date: 07/13/2015 OT Individual Time: 1400-1430 OT Individual Time Calculation (min): 30 min    Short Term Goals: Week 1:  OT Short Term Goal 1 (Week 1): Pt will complete stand pivot transfer to [facility] with mod A OT Short Term Goal 2 (Week 1): Pt will carry over transfer technique of stand pivot transfer with min cues OT Short Term Goal 3 (Week 1): Pt will don pants using AE PRN with min A OT Short Term Goal 4 (Week 1): Pt will complete toileting task with mod A.  Skilled Therapeutic Interventions/Progress Updates:  Upon entering the room, pt seated in wheelchair with no c/o pain this session. OT administered Montreal Cognitive Assessment [facility]) with pt scoring an 8/30 which shows significant cognitive deficits. SLP evaluation may be appropriate based on these results. Pt transferred from wheelchair >bed with max A for stand pivot transfer. Mod A for sit >supine with A for B LEs. Pt supine in bed with call bell and all needed items within reach upon exiting the room.   Therapy Documentation Precautions:  Precautions Precautions: Fall Precaution Comments: HD MWF Required Braces or Orthoses: Knee Immobilizer - Right Knee Immobilizer - Right: On when out of bed or walking Restrictions Weight Bearing Restrictions: Yes RLE Weight Bearing: Non weight bearing General: General Chart Reviewed: Yes Vital Signs: Therapy Vitals Temp: 97.5 F (36.4 C) Temp Source: Oral Pulse Rate: (!) 59 Resp: 20 BP: (!) 115/34 mmHg Patient Position (if appropriate): Lying Oxygen Therapy SpO2: 98 % O2 Device: Not Delivered  See Function Navigator for Current Functional Status.   Therapy/Group: Individual Therapy  Phineas Semen 07/13/2015, 4:20 PM

## 2015-07-13 NOTE — Progress Notes (Signed)
Initial Nutrition Assessment  DOCUMENTATION CODES:   Not applicable  INTERVENTION:  Provide Nepro Shake po BID, each supplement provides 425 kcal and 19 grams protein.  Encourage adequate PO intake.   NUTRITION DIAGNOSIS:   Increased nutrient needs related to chronic illness as evidenced by estimated needs.  GOAL:   Patient will meet greater than or equal to 90% of their needs  MONITOR:   PO intake, Supplement acceptance, Weight trends, Labs, I & O's  REASON FOR ASSESSMENT:   Malnutrition Screening Tool    ASSESSMENT:   82 year old right-handed female well known to rehabilitation services for history of right pilon fracture received inpatient rehabilitation services 2 years ago and was discharged home with daughter. Past medical history of end-stage renal disease with hemodialysis, diabetes mellitus, left breast cancer, PAF maintained on aspirin 81 mg, chronic diastolic congestive heart failure. Presented 07/05/2015 [place] after mechanical fall without loss of consciousness. Sustained comminuted fracture right tibia-fibula. Underwent ORIF 07/07/2015  Pt reports appetite is good currently and PTA with usual consumption of at least 3 meals a day with no other difficulties. Meal completion however has been 50%. Pt reports she does not like the food at meals due to lack of seasoning on them. Per Epic weight records, weight has been stable. RD to order nutritional supplements to aid in caloric and protein needs. Pt with no observed significant fat or muscle mass loss.   Labs and medications reviewed.   Diet Order:  Diet renal with fluid restriction Fluid restriction:: 1200 mL Fluid; Room service appropriate?: Yes; Fluid consistency:: Thin  Skin:   (Incision on R tibial)  Last BM:  4/25  Height:   Ht Readings from Last 1 Encounters:  07/12/15 5\' 1"  (1.549 m)    Weight:   Wt Readings from Last 1 Encounters:  07/13/15 132 lb 15 oz  (60.3 kg)    Ideal Body Weight:  47.7 kg  BMI:  Body mass index is 25.13 kg/(m^2).  Estimated Nutritional Needs:   Kcal:  1650-1850  Protein:  75-85 grams  Fluid:  1.2 L/day  EDUCATION NEEDS:   No education needs identified at this time  Corrin Parker, MS, RD, LDN Pager # [phone] After hours/ weekend pager # [phone]

## 2015-07-14 ENCOUNTER — Inpatient Hospital Stay (HOSPITAL_COMMUNITY): Payer: Medicare Other | Admitting: Occupational Therapy

## 2015-07-14 ENCOUNTER — Inpatient Hospital Stay (HOSPITAL_COMMUNITY): Payer: Medicare Other

## 2015-07-14 LAB — RENAL FUNCTION PANEL
ANION GAP: 15 (ref 5–15)
Albumin: 2.2 g/dL — ABNORMAL LOW (ref 3.5–5.0)
BUN: 45 mg/dL — AB (ref 6–20)
CO2: 29 mmol/L (ref 22–32)
Calcium: 9.5 mg/dL (ref 8.9–10.3)
Chloride: 93 mmol/L — ABNORMAL LOW (ref 101–111)
Creatinine, Ser: 6.11 mg/dL — ABNORMAL HIGH (ref 0.44–1.00)
GFR calc Af Amer: 7 mL/min — ABNORMAL LOW (ref >60)
GFR calc non Af Amer: 6 mL/min — ABNORMAL LOW (ref >60)
GLUCOSE: 161 mg/dL — AB (ref 65–99)
Phosphorus: 3.1 mg/dL (ref 2.5–4.6)
Potassium: 4.5 mmol/L (ref 3.5–5.1)
Sodium: 137 mmol/L (ref 135–145)

## 2015-07-14 LAB — CBC
HCT: 30.7 % — ABNORMAL LOW (ref 36.0–46.0)
Hemoglobin: 10 g/dL — ABNORMAL LOW (ref 12.0–15.0)
MCH: 31.2 pg (ref 26.0–34.0)
MCHC: 32.6 g/dL (ref 30.0–36.0)
MCV: 95.6 fL (ref 78.0–100.0)
Platelets: 316 10*3/uL (ref 150–400)
RBC: 3.21 MIL/uL — ABNORMAL LOW (ref 3.87–5.11)
RDW: 15.3 % (ref 11.5–15.5)
WBC: 13.1 10*3/uL — ABNORMAL HIGH (ref 4.0–10.5)

## 2015-07-14 LAB — GLUCOSE, CAPILLARY
Glucose-Capillary: 163 mg/dL — ABNORMAL HIGH (ref 65–99)
Glucose-Capillary: 146 mg/dL — ABNORMAL HIGH (ref 65–99)
Glucose-Capillary: 129 mg/dL — ABNORMAL HIGH (ref 65–99)

## 2015-07-14 MED ORDER — PENTAFLUOROPROP-TETRAFLUOROETH EX AERO
1.0000 | INHALATION_SPRAY | CUTANEOUS | Status: DC | PRN
Start: 2015-07-14 — End: 2015-07-14

## 2015-07-14 MED ORDER — SODIUM CHLORIDE 0.9 % IV SOLN: 100.0000 mL | INTRAVENOUS | Status: DC | PRN

## 2015-07-14 MED ORDER — OXYCODONE HCL 5 MG PO TABS
ORAL_TABLET | ORAL | Status: AC
  Filled 2015-07-14: qty 1

## 2015-07-14 MED ORDER — DARBEPOETIN ALFA 60 MCG/0.3ML IJ SOSY
60.0000 ug | PREFILLED_SYRINGE | INTRAMUSCULAR | Status: DC
  Administered 2015-07-22 – 2015-07-28 (×2): 60 ug via INTRAVENOUS
  Filled 2015-07-14 (×2): qty 0.3

## 2015-07-14 MED ORDER — HEPARIN SODIUM (PORCINE) 1000 UNIT/ML DIALYSIS
1000.0000 [IU] | INTRAMUSCULAR | Status: DC | PRN
  Filled 2015-07-14: qty 1

## 2015-07-14 MED ORDER — LIDOCAINE HCL (PF) 1 % IJ SOLN: 5.0000 mL | INTRAMUSCULAR | Status: DC | PRN

## 2015-07-14 MED ORDER — LIDOCAINE-PRILOCAINE 2.5-2.5 % EX CREA: 1.0000 "application " | TOPICAL_CREAM | CUTANEOUS | Status: DC | PRN

## 2015-07-14 MED ORDER — DICLOFENAC SODIUM 1 % TD GEL
2.0000 g | Freq: Three times a day (TID) | TRANSDERMAL | Status: DC
  Administered 2015-07-14 – 2015-07-28 (×38): 2 g via TOPICAL
  Filled 2015-07-14: qty 100

## 2015-07-14 MED ORDER — ALTEPLASE 2 MG IJ SOLR
2.0000 mg | Freq: Once | INTRAMUSCULAR | Status: DC | PRN
  Filled 2015-07-14: qty 2

## 2015-07-14 NOTE — Progress Notes (Signed)
[facility]     PROGRESS NOTE    Subjective/Complaints: Hands are still sore, particularly thumbs. Tired from therapy yesterday  ROS: Pt denies fever, rash/itching, headache, blurred or double vision, nausea, vomiting, abdominal pain, diarrhea, chest pain, shortness of breath, palpitations, dysuria, dizziness, neck or back pain, bleeding, anxiety, or depression  Objective: Vital Signs: Blood pressure 126/36, pulse 64, temperature 98.4 F (36.9 C), temperature source Oral, resp. rate 18, height 5\' 1"  (1.549 m), weight 60.8 kg (134 lb 0.6 oz), SpO2 98 %. No results found.  Recent Labs  07/13/15 0438 07/14/15 0503  WBC 9.9 13.1*  HGB 9.6* 10.0*  HCT 29.7* 30.7*  PLT 292 316    Recent Labs  07/13/15 0438 07/14/15 0503  NA 138 137  K 4.1 4.5  CL 95* 93*  GLUCOSE 242* 161*  BUN 27* 45*  CREATININE 4.03* 6.11*  CALCIUM 9.1 9.5   CBG (last 3)   Recent Labs  07/13/15 1214 07/13/15 1648 07/13/15 2042  GLUCAP 74 306* 110*    Wt Readings from Last 3 Encounters:  07/14/15 60.8 kg (134 lb 0.6 oz)  07/11/15 66.225 kg (146 lb)  01/06/14 66.996 kg (147 lb 11.2 oz)    Physical Exam:  Constitutional: She is oriented to person, place, and time. She appears well-developed and well-nourished.  HENT:  Head: Normocephalic and atraumatic.  Eyes: Conjunctivae and EOM are normal.  Neck: Normal range of motion. Neck supple. No thyromegaly present.  Cardiovascular:  Irregular irregular RUE fistula  Respiratory: Effort normal and breath sounds normal. No respiratory distress.  GI: Soft. Bowel sounds are normal. She exhibits no distension.  Musculoskeletal: She exhibits edema and tenderness. Both first cmc's tender Neurological: She is alert and oriented to person, place, and time.  A&Ox2 DTRs symmetric Sensation intact to light touch Motor: B/l UE 4/5 proximal to distal LLE: 3- proximally, 4/5 distally RLE: hip flexion 2/5, wiggles toes   Skin:  Right leg wounds clean/dry with staples.  Leg still slightly swollen/red Psychiatric: She has a normal mood and affect. Her behavior is normal.     Assessment/Plan: 1. Functional deficits secondary to right tib/fib fx which require 3+ hours per day of interdisciplinary therapy in a comprehensive inpatient rehab setting. Physiatrist is providing close team supervision and 24 hour management of active medical problems listed below. Physiatrist and rehab team continue to assess barriers to discharge/monitor patient progress toward functional and medical goals.  Function:  Bathing Bathing position   Position: Wheelchair/chair at sink  Bathing parts Body parts bathed by patient: Right arm, Left arm, Chest, Abdomen, Front perineal area, Right upper leg, Left upper leg Body parts bathed by helper: Right lower leg, Left lower leg, Back, Buttocks  Bathing assist Assist Level: Touching or steadying assistance(Pt > 75%)      Upper Body Dressing/Undressing Upper body dressing   What is the patient wearing?: Pull over shirt/dress     Pull over shirt/dress - Perfomed by patient: Thread/unthread right sleeve, Thread/unthread left sleeve, Put head through opening Pull over shirt/dress - Perfomed by helper: Pull shirt over trunk        Upper body assist Assist Level: Touching or steadying assistance(Pt > 75%)      Lower Body Dressing/Undressing Lower body dressing   What is the patient wearing?: Pants, Shoes     Pants- Performed by patient: Thread/unthread left pants leg Pants- Performed by helper: Thread/unthread right pants leg, Pull pants up/down  Shoes - Performed by helper: Don/doff right shoe, Don/doff left shoe          Lower body assist Assist for lower body dressing: Touching or steadying assistance (Pt > 75%)      Toileting Toileting   Toileting steps completed by patient: Adjust clothing prior to toileting, Performs perineal hygiene, Adjust clothing  after toileting   Toileting Assistive Devices: Grab bar or rail  Toileting assist Assist level: Touching or steadying assistance (Pt.75%)   Transfers Chair/bed transfer   Chair/bed transfer method: Stand pivot Chair/bed transfer assist level: Maximal assist (Pt 25 - 49%/lift and lower) Chair/bed transfer assistive device: Armrests, Orthosis     Locomotion Ambulation Ambulation activity did not occur: Safety/medical concerns (unsafe on eval)         Wheelchair   Type: Manual Max wheelchair distance: 110 Assist Level: Supervision or verbal cues  Cognition Comprehension Comprehension assist level: Understands basic 90% of the time/cues < 10% of the time  Expression Expression assist level: Expresses basic 90% of the time/requires cueing < 10% of the time.  Social Interaction Social Interaction assist level: Interacts appropriately with others - No medications needed.  Problem Solving Problem solving assist level: Solves complex 90% of the time/cues < 10% of the time  Memory Memory assist level: Recognizes or recalls 90% of the time/requires cueing < 10% of the time    Medical Problem List and Plan: 1. Limited functional mobility secondary to comminuted right tibia-fibula fracture. Status post ORIF 07/07/2015. Nonweightbearing. Knee immobilizer when out of bed. No ROM precautions  -continue CIR therapies 2. DVT Prophylaxis/Anticoagulation: Subcutaneous heparin. Monitor platelet counts and any signs of bleeding 3. Pain Management: Oxycodone 5-10 mg every 4 hours as needed  -added voltaren gel for bilateral hand OA 4. Acute blood loss anemia. hgb 9.6. Fe++ supp per nephrology 5. Neuropsych: This patient is capable of making decisions on her own behalf. 6. Skin/Wound Care: can change right leg dressing as needed 7. Fluids/Electrolytes/Nutrition: Routine I&O's with follow-up chemistries reviewed. Near baseline 8. End-stage renal disease with hemodialysis. HD each day after therapy  day. 9. PAF. Aspirin 325 mg daily. Cardiac rate control 10. Hypertension. Hydralazine 50 mg twice a day, Imdur 30 mg daily 11. Diastolic congestive heart failure. Monitor for any signs of fluid overload 12. Diabetes mellitus. Checking blood sugars before meals and at bedtime. Sliding scale insulin.   -sugars still extremely variable---continue to follow for pattern before scheduling oral med or insulin 13. Hypothyroidism. Synthroid 75 g daily 14. Hyperlipidemia. Pravachol  LOS (Days) 2 A FACE TO FACE EVALUATION WAS PERFORMED  Merlyn Bollen T 07/14/2015 8:25 AM

## 2015-07-14 NOTE — Progress Notes (Signed)
MEDICATION RELATED NOTE   Pharmacy re:  Home Medication List   Medications:  Prescriptions prior to admission  Medication Sig Dispense Refill Last Dose  . aspirin 81 MG EC tablet Take 81 mg by mouth daily.   Taking  . b complex-vitamin c-folic acid (NEPHRO-VITE) 0.8 MG TABS tablet Take 1 tablet by mouth at bedtime.   Taking  . calcium acetate (PHOSLO) 667 MG capsule Take 1 capsule (667 mg total) by mouth 3 (three) times daily with meals. 90 capsule 2 Taking  . darbepoetin (ARANESP) 100 MCG/0.5ML SOLN Inject 0.5 mLs (100 mcg total) into the vein every Wednesday with hemodialysis. 4.2 mL 6 Taking  . doxercalciferol (HECTOROL) 4 MCG/2ML injection Inject 1.5 mLs (3 mcg total) into the vein every Monday, Wednesday, and Friday with hemodialysis. 2 mL  Taking  . hydrALAZINE (APRESOLINE) 25 MG tablet Take 1 tablet (25 mg total) by mouth every 8 (eight) hours. 100 tablet 6 Taking  . insulin lispro protamine-lispro (HUMALOG 75/25) (75-25) 100 UNIT/ML SUSP Inject 5-10 Units into the skin 2 (two) times daily as needed.   Taking  . isosorbide mononitrate (IMDUR) 30 MG 24 hr tablet Take 30 mg by mouth daily.   Taking  . levothyroxine (SYNTHROID, LEVOTHROID) 75 MCG tablet Take 1 tablet (75 mcg total) by mouth daily before breakfast. 30 tablet 2   . nebivolol (BYSTOLIC) 10 MG tablet Take 1 tablet (10 mg total) by mouth daily. 30 tablet 6 Taking  . sodium chloride 0.9 % SOLN 100 mL with ferric gluconate 12.5 MG/ML SOLN 62.5 mg Inject 62.5 mg into the vein every Wednesday with hemodialysis.   Taking  . Vitamin D, Ergocalciferol, (DRISDOL) 50000 UNITS CAPS Take 50,000 Units by mouth every 7 (seven) days. Takes on Fridays.   Taking  . vitamin E 400 UNIT capsule Take 400 Units by mouth daily.   Taking    Assessment: Attempts have been made to complete the most accurate medication history list based on information from [facility], [place] care and [facility].  Please consider additional review  and order those medications most appropriate for her during her transitions in care.  Plan: I have marked her record complete.  Rober Minion, PharmD., MS Clinical Pharmacist Pager:  [phone] Thank you for allowing pharmacy to be part of this patients care team. 07/14/2015,8:14 AM

## 2015-07-14 NOTE — IPOC Note (Addendum)
Overall Plan of Care [facility]) Patient Details Name: [name] MRN: [id number] DOB: 06/16/1928  Admitting Diagnosis: R tib fix fx  ESRD   HD  Hospital Problems: Principal Problem:   Closed fracture of right fibula and tibia Active Problems:   Swelling   Post-operative pain   Acute blood loss anemia   ESRD on dialysis ([place])   PAF (paroxysmal atrial fibrillation) (HCC)   Benign essential HTN   Chronic diastolic congestive heart failure (HCC)   Diabetes mellitus type 2 in nonobese (HCC)   HLD (hyperlipidemia)   Thyroid activity decreased     Functional Problem List: Nursing Bladder, Bowel, Edema, Medication Management, Nutrition, Pain, Safety, Skin Integrity  PT Balance, Edema, Endurance, Motor, Pain, Skin Integrity  OT Balance, Cognition, Endurance, Pain, Safety  SLP    TR         Basic ADL's: OT Bathing, Dressing, Toileting     Advanced  ADL's: OT       Transfers: PT Bed Mobility, Bed to Chair, Car, Manufacturing systems engineer, Metallurgist: PT Ambulation, Emergency planning/management officer, Stairs     Additional Impairments: OT None  SLP        TR      Anticipated Outcomes Item Anticipated Outcome  Self Feeding Independent  Swallowing      Basic self-care  Supervision- min A  Toileting  Min A   Bathroom Transfers Supervision- min A  Bowel/Bladder  min assist  Transfers  supervision basic; min assist car  Locomotion  mod i w/c mobility; min assist short distance gait  Communication     Cognition     Pain  <3 on a 0-10  Safety/Judgment  min assist   Therapy Plan: PT Intensity: Minimum of 1-2 x/day ,45 to 90 minutes PT Frequency: 5 out of 7 days PT Duration Estimated Length of Stay: 12-16 days OT Intensity: Minimum of 1-2 x/day, 45 to 90 minutes OT Frequency: 5 out of 7 days OT Duration/Estimated Length of Stay: 12-16 days         Team Interventions: Nursing Interventions Patient/Family Education, Bladder Management, Bowel Management, Pain  Management, Medication Management, Skin Care/Wound Management, Discharge Planning  PT interventions Ambulation/gait training, Balance/vestibular training, Community reintegration, Discharge planning, Disease management/prevention, DME/adaptive equipment instruction, Functional mobility training, Neuromuscular re-education, Pain management, Patient/family education, Psychosocial support, Skin care/wound management, Splinting/orthotics, Stair training, Therapeutic Activities, Therapeutic Exercise, UE/LE Strength taining/ROM, UE/LE Coordination activities, Wheelchair propulsion/positioning  OT Interventions Training and development officer, Cognitive remediation/compensation, Academic librarian, Discharge planning, DME/adaptive equipment instruction, Functional mobility training, Pain management, Patient/family education, UE/LE Strength taining/ROM, Therapeutic Exercise, UE/LE Coordination activities, Wheelchair propulsion/positioning, Self Care/advanced ADL retraining  SLP Interventions    TR Interventions    SW/CM Interventions Discharge Planning, Psychosocial Support, Patient/Family Education    Team Discharge Planning: Destination: PT-Home (daughter's home) ,OT- Home , SLP-  Projected Follow-up: PT-Home health PT, 24 hour supervision/assistance, OT-  None, SLP-  Projected Equipment Needs: PT- (elevating legrests for pt's personal w/c), OT- To be determined, SLP-  Equipment Details: PT-pt has cane, RW, and w/c , OT-  Patient/family involved in discharge planning: PT- Patient,  OT- , SLP-   MD ELOS: 13-14 days Medical Rehab Prognosis:  Excellent Assessment: The patient has been admitted for CIR therapies with the diagnosis of right tib-fib fx. The team will be addressing functional mobility, strength, stamina, balance, safety, adaptive techniques and equipment, self-care, bowel and bladder mgt, patient and caregiver education, ortho precautions, pain mgt, wound care, community reintegration,  w/c  assessment. Adjusting HD to accommodate for therapy.. Goals have been set at supervision to min assist for transfers/mobility/self-care/ADL's. Mod I with w/c propulsion    Meredith Staggers, MD, [facility]      See Team Conference Notes for weekly updates to the plan of care

## 2015-07-14 NOTE — Progress Notes (Signed)
Occupational Therapy Session Note  Patient Details  Name: [name] MRN: [id number] Date of Birth: 1929-02-04  Today's Date: 07/14/2015 OT Individual Time: 1130-1200 OT Individual Time Calculation (min): 30 min    Short Term Goals: Week 1:  OT Short Term Goal 1 (Week 1): Pt will complete stand pivot transfer to [facility] with mod A OT Short Term Goal 2 (Week 1): Pt will carry over transfer technique of stand pivot transfer with min cues OT Short Term Goal 3 (Week 1): Pt will don pants using AE PRN with min A OT Short Term Goal 4 (Week 1): Pt will complete toileting task with mod A.      Skilled Therapeutic Interventions/Progress Updates:    Pt seen this session to address [facility] transfers. Pt received in w/c, she was very anxious about moving the R leg.  Leg supported by therapist. Pt used slide board to bed with min A. From bed, sit to stand to RW with max A. Once in standing stand pivot maintaining R NWB with min a to BSC.  Completed same transfer to w/c needing max A with both lifting and lowering.  Pt was feeling very fatigued. Adjusted in w/c with R leg elevated.  Pt with call light and all needs met.   Therapy Documentation Precautions:  Precautions Precautions: Fall Precaution Comments: HD MWF Required Braces or Orthoses: Knee Immobilizer - Right Knee Immobilizer - Right: On when out of bed or walking Restrictions Weight Bearing Restrictions: Yes RLE Weight Bearing: Non weight bearing    Pain: Pain Assessment Pain Assessment: 0-10 Pain Score: 6  Pain Location: Leg Pain Orientation: Right Pain Intervention(s): Repositioned;Rest ADL:  See Function Navigator for Current Functional Status.   Therapy/Group: Individual Therapy  Lilliane Sposito 07/14/2015, 12:50 PM

## 2015-07-14 NOTE — Progress Notes (Signed)
Occupational Therapy Session Note  Patient Details  Name: [name] MRN: [id number] Date of Birth: 04-19-1928  Today's Date: 07/14/2015 OT Individual Time: [id number] Individual Time Calculation (min): 85 min    Short Term Goals: Week 1:  OT Short Term Goal 1 (Week 1): Pt will complete stand pivot transfer to [facility] with mod A OT Short Term Goal 2 (Week 1): Pt will carry over transfer technique of stand pivot transfer with min cues OT Short Term Goal 3 (Week 1): Pt will don pants using AE PRN with min A OT Short Term Goal 4 (Week 1): Pt will complete toileting task with mod A.  Skilled Therapeutic Interventions/Progress Updates:    Pt seen for OT ADL bathing/dressing session. Pt in supine upon arrival, no complaints of pain while at rest. LE immobilizer donned total A. She transferred to EOB with A for management of R LE and using hospital bed functions. She completed bathing/dressing seated on EOB, requiring frequent reclined rest breaks throughout. R LE resting on pillow while seated unsupported. She was educated regarding use of reacher for donning pants, however, displayed difficulty with new learning of technique and was able to thread R LE with min A not using AE. She completed lateral leans to complete buttock hygiene, however, leans were not functional enough to pull pants up completely. She stood from EOB with max A x3 trials during session for clothing management to be completed, tolerating ~10 seconds of each trial with heavy steadying assist.  She completed min A sliding board transfer into w/c at end of session, left with all needs in reach.   Therapy Documentation Precautions:  Precautions Precautions: Fall Precaution Comments: HD MWF Required Braces or Orthoses: Knee Immobilizer - Right Knee Immobilizer - Right: On when out of bed or walking Restrictions Weight Bearing Restrictions: Yes RLE Weight Bearing: Non weight bearing Pain:   No pain in supine, voiced 10/10  pain with mobility. Rest and repositioned  See Function Navigator for Current Functional Status.   Therapy/Group: Individual Therapy  Lewis, Cage Gupton C 07/14/2015, 7:14 AM

## 2015-07-14 NOTE — Consult Note (Signed)
[facility] Renal Consultation Note    Indication for Consultation:  Management of ESRD/hemodialysis; anemia, hypertension/volume and secondary hyperparathyroidism  HPI: [name] is a 82 y.o. female.   [name] is an 82yo AAF with PMH sig for DM, HTN, CHF, h/o breast cancer, Afib on coumadin, and ESRD on HD every MWF (travels between [place] and [facility] here in town) who unfortunately had a mechanical fall and suffered a right tibial fx s/p ORIF.  She was transferred to [facility] for ongoing rehab and we were asked to help manage her ESRD and dialysis needs while she remains an inpatient.  Past Medical History  Diagnosis Date  . CHF (congestive heart failure) ([place])   . DM2 (diabetes mellitus, type 2) ([place])   . ESRD (end stage renal disease) ([place])     HD MWF - [facility]  . HTN (hypertension)   . Hyperlipidemia   . Tibia/fibula fracture 07/10/2011    right - nonunion repair 06/2012  . Hypothyroid   . Breast cancer ([place])   . Arthritis   . Hyperparathyroidism, secondary renal ([place])   . Shortness of breath   . Atrial fibrillation ([place]) dx 06/2012    on coumadin  . Atrial fibrillation ([place]) 06/25/2012    2D Echo - EF 45-50, left atrium severely dilated, mitral valve heavily calcified annulus with calcification of the subvalvular apparatus   Past Surgical History  Procedure Laterality Date  . Mastectomy    . Abdominal surgery    . Thyroid surgery    . Breast surgery Left     Mastectomy  . Eye surgery Bilateral     Cataract  . Ankle fusion Right   . Arteriovenous graft placement Right   . Orif tibia fracture Right 06/16/2012    Procedure: OPEN REDUCTION INTERNAL FIXATION (ORIF) TIBIA FRACTURE;  Surgeon: Rozanna Box, MD;  Location: [place];  Service: Orthopedics;  Laterality: Right;   Family History:   Family History  Problem Relation Age of Onset  . Hyperlipidemia Father   . Diabetes Father   . Breast cancer Sister    Social History:  reports that she  has never smoked. Her smokeless tobacco use includes Snuff. She reports that she does not drink alcohol or use illicit drugs. Allergies  Allergen Reactions  . Codeine Swelling and Other (See Comments)  . Coreg [Carvedilol] Other (See Comments)    Headaches   . Lopressor [Metoprolol Tartrate] Other (See Comments)    GI upset  . Metoprolol Other (See Comments)    GI upset   Prior to Admission medications   Medication Sig Start Date End Date Taking? Authorizing Provider  aspirin 81 MG EC tablet Take 81 mg by mouth daily.    Historical Provider, MD  b complex-vitamin c-folic acid (NEPHRO-VITE) 0.8 MG TABS tablet Take 1 tablet by mouth at bedtime.    Historical Provider, MD  calcium acetate (PHOSLO) 667 MG capsule Take 1 capsule (667 mg total) by mouth 3 (three) times daily with meals. 12/07/13   Carmelina Paddock, NP  darbepoetin (ARANESP) 100 MCG/0.5ML SOLN Inject 0.5 mLs (100 mcg total) into the vein every Wednesday with hemodialysis. 07/25/12   Erlene Quan, PA-C  doxercalciferol (HECTOROL) 4 MCG/2ML injection Inject 1.5 mLs (3 mcg total) into the vein every Monday, Wednesday, and Friday with hemodialysis. 07/25/12   Erlene Quan, PA-C  hydrALAZINE (APRESOLINE) 25 MG tablet Take 1 tablet (25 mg total) by mouth every 8 (eight) hours. 07/25/12  Luke K Kilroy, PA-C  insulin lispro protamine-lispro (HUMALOG 75/25) (75-25) 100 UNIT/ML SUSP Inject 5-10 Units into the skin 2 (two) times daily as needed.    Historical Provider, MD  isosorbide mononitrate (IMDUR) 30 MG 24 hr tablet Take 30 mg by mouth daily.    Historical Provider, MD  levothyroxine (SYNTHROID, LEVOTHROID) 75 MCG tablet Take 1 tablet (75 mcg total) by mouth daily before breakfast. 05/11/14   Rowe Clack, MD  nebivolol (BYSTOLIC) 10 MG tablet Take 1 tablet (10 mg total) by mouth daily. 07/25/12   Erlene Quan, PA-C  sodium chloride 0.9 % SOLN 100 mL with ferric gluconate 12.5 MG/ML SOLN 62.5 mg Inject 62.5 mg into the vein every  Wednesday with hemodialysis. 07/25/12   Erlene Quan, PA-C  Vitamin D, Ergocalciferol, (DRISDOL) 50000 UNITS CAPS Take 50,000 Units by mouth every 7 (seven) days. Takes on Fridays.    Historical Provider, MD  vitamin E 400 UNIT capsule Take 400 Units by mouth daily.    Historical Provider, MD   Current Facility-Administered Medications  Medication Dose Route Frequency Provider Last Rate Last Dose  . acetaminophen (TYLENOL) tablet 325-650 mg  325-650 mg Oral Q4H PRN Lavon Paganini Angiulli, PA-C      . aspirin tablet 325 mg  325 mg Oral Daily Lavon Paganini Angiulli, PA-C   325 mg at 07/13/15 [id number]  . calcium acetate (PHOSLO) capsule 667 mg  667 mg Oral BID WC Lavon Paganini Angiulli, PA-C   667 mg at 07/14/15 [id number]  . cholecalciferol (VITAMIN D) tablet 5,000 Units  5,000 Units Oral Daily Lavon Paganini Angiulli, PA-C   5,000 Units at 07/13/15 0820  . diclofenac sodium (VOLTAREN) 1 % transdermal gel 2 g  2 g Topical TID Meredith Staggers, MD   2 g at 07/14/15 1055  . feeding supplement (NEPRO CARB STEADY) liquid 237 mL  237 mL Oral BID BM Meredith Staggers, MD   237 mL at 07/14/15 1055  . heparin injection 5,000 Units  5,000 Units Subcutaneous Q8H Lavon Paganini Angiulli, PA-C   5,000 Units at 07/14/15 [id number]  . hydrALAZINE (APRESOLINE) tablet 50 mg  50 mg Oral BID Lavon Paganini Angiulli, PA-C   50 mg at 07/13/15 [id number]  . insulin aspart (novoLOG) injection 0-24 Units  0-24 Units Subcutaneous TID AC & HS Lavon Paganini Angiulli, PA-C   2 Units at 07/14/15 1315  . isosorbide mononitrate (IMDUR) 24 hr tablet 30 mg  30 mg Oral Daily Lavon Paganini Angiulli, PA-C   30 mg at 07/13/15 [id number]  . levothyroxine (SYNTHROID, LEVOTHROID) tablet 75 mcg  75 mcg Oral QAC breakfast Lavon Paganini Angiulli, PA-C   75 mcg at 07/14/15 [id number]  . multivitamin (RENA-VIT) tablet 1 tablet  1 tablet Oral QHS Lavon Paganini Angiulli, PA-C   1 tablet at 07/13/15 2215  . nebivolol (BYSTOLIC) tablet 10 mg  10 mg Oral Daily Lavon Paganini Angiulli, PA-C   10 mg at 07/13/15 [id number]  . ondansetron (ZOFRAN)  tablet 4 mg  4 mg Oral Q6H PRN Lavon Paganini Angiulli, PA-C       Or  . ondansetron (ZOFRAN) injection 4 mg  4 mg Intravenous Q6H PRN Lavon Paganini Angiulli, PA-C      . oxyCODONE (Oxy IR/ROXICODONE) immediate release tablet 5 mg  5 mg Oral Q4H PRN Lavon Paganini Angiulli, PA-C   5 mg at 07/14/15 1312  . pravastatin (PRAVACHOL) tablet 20 mg  20 mg Oral q1800 Lavon Paganini Angiulli, PA-C   20  mg at 07/13/15 1854  . sorbitol 70 % solution 30 mL  30 mL Oral Daily PRN Lavon Paganini Angiulli, PA-C      . vitamin E capsule 400 Units  400 Units Oral Daily Cathlyn Parsons, PA-C   400 Units at 07/13/15 [id number]   Labs: Basic Metabolic Panel:  Recent Labs Lab 07/12/15 1555 07/13/15 0438 07/14/15 0503  NA  --  138 137  K  --  4.1 4.5  CL  --  95* 93*  CO2  --  30 29  GLUCOSE  --  242* 161*  BUN  --  27* 45*  CREATININE 3.00* 4.03* 6.11*  CALCIUM  --  9.1 9.5  PHOS  --   --  3.1   Liver Function Tests:  Recent Labs Lab 07/13/15 0438 07/14/15 0503  AST 77*  --   ALT 12*  --   ALKPHOS 282*  --   BILITOT 0.7  --   PROT 6.3*  --   ALBUMIN 2.3* 2.2*   No results for input(s): LIPASE, AMYLASE in the last 168 hours. No results for input(s): AMMONIA in the last 168 hours. CBC:  Recent Labs Lab 07/12/15 1555 07/13/15 0438 07/14/15 0503  WBC 10.1 9.9 13.1*  NEUTROABS  --  6.6  --   HGB 10.7* 9.6* 10.0*  HCT 33.7* 29.7* 30.7*  MCV 95.2 96.1 95.6  PLT 281 292 316   Cardiac Enzymes: No results for input(s): CKTOTAL, CKMB, CKMBINDEX, TROPONINI in the last 168 hours. CBG:  Recent Labs Lab 07/13/15 1214 07/13/15 1648 07/13/15 2042 07/14/15 0631 07/14/15 1136  GLUCAP 74 306* 110* 163* 146*   Iron Studies: No results for input(s): IRON, TIBC, TRANSFERRIN, FERRITIN in the last 72 hours. Studies/Results: No results found.  ROS: Pertinent items are noted in HPI. Physical Exam: Filed Vitals:   07/13/15 2159 07/13/15 2211 07/14/15 0007 07/14/15 0518  BP: 106/33 112/31 120/36 126/36  Pulse: 70 75 64 64   Temp:    98.4 F (36.9 C)  TempSrc:    Oral  Resp:    18  Height:      Weight:    60.8 kg (134 lb 0.6 oz)  SpO2:    98%      Weight change: 0.5 kg (1 lb 1.6 oz)  Intake/Output Summary (Last 24 hours) at 07/14/15 1326 Last data filed at 07/14/15 0730  Gross per 24 hour  Intake    360 ml  Output      0 ml  Net    360 ml   BP 126/36 mmHg  Pulse 64  Temp(Src) 98.4 F (36.9 C) (Oral)  Resp 18  Ht 5\' 1"  (1.549 m)  Wt 60.8 kg (134 lb 0.6 oz)  BMI 25.34 kg/m2  SpO2 98% General appearance: alert, cooperative and no distress Head: Normocephalic, without obvious abnormality, atraumatic Resp: clear to auscultation bilaterally Cardio: no rub GI: soft, non-tender; bowel sounds normal; no masses,  no organomegaly Extremities: RUE AVF +T/B, right leg in brace Dialysis Access:  Dialysis Orders: Center: [facility]  on MWF . EDW 64.5 HD Bath 2K/2.5Ca  Time 3.5hrs Heparin std. Access RUE AVF BFR 400 DFR 800      Assessment/Plan: 1.  R tibial fx - s/p ORIF, now in rehab 2.  ESRD -  Cont with HD qMWF 3.  Hypertension/volume  - stable 4.  Anemia  - likely some ABLA on Anemia of chronic disease.  Will dose with aranesp with HD 5.  Metabolic bone disease -  Continue with binders and need to contact her primary dialysis unit for vit D info 6.  Nutrition - renal diet 7. Vascular access- RUE AVF +T/B  Donetta Potts, MD [place] Pager [phone] 07/14/2015, 1:26 PM

## 2015-07-14 NOTE — Progress Notes (Signed)
Physical Therapy Session Note  Patient Details  Name: [name] MRN: [id number] Date of Birth: 05/25/1928  Today's Date: 07/14/2015 PT Individual Time: 1400-1525 PT Individual Time Calculation (min): 85 min   Short Term Goals: Week 1:  PT Short Term Goal 1 (Week 1): Pt will be able to perform basic transfers with min assist PT Short Term Goal 2 (Week 1): Pt will be able to perform sit <> stands with min assist PT Short Term Goal 3 (Week 1): Pt will be able to propel w/c x 150' with supervision PT Short Term Goal 4 (Week 1): Pt will be able to initiate short distance gait  Skilled Therapeutic Interventions/Progress Updates:   session focused on functional bed mobility, slideboard transfers, standing attempts in parallel bars, UE strengthening and endurance, and w/c mobility.  Pt continues to demonstrate fluctuating cognition throughout session with noted poor carryover and recall of information. Some motor planning difficulties as well requiring mod verbal cues.   Pt unable to tolerate standing in parallel bars despite several attempts and multiple modifications due to increased pain in RLE not allowing pt to tolerate in dependent position. Performed SCI Fit for UE strengthening and endurance on Random program level 1 x 6 min total each direction at 2 min intervals each with rest breaks. Pt requires extra time for bed mobility and slideboard transfers due to therapist needing to keep RLE supported at all times and for pt to sequence and motor plan task. End of session, positioned in bed with NT present.   Therapy Documentation Precautions:  Precautions Precautions: Fall Precaution Comments: HD MWF Required Braces or Orthoses: Knee Immobilizer - Right Knee Immobilizer - Right: On when out of bed or walking Restrictions Weight Bearing Restrictions: Yes RLE Weight Bearing: Non weight bearing  Pain: C/o 8-9/10 pain in RLE - unable to tolerate in dependent position today.     See  Function Navigator for Current Functional Status.   Therapy/Group: Individual Therapy  Canary Brim Ivory Broad, PT, DPT  07/14/2015, 3:29 PM

## 2015-07-15 DIAGNOSIS — D72829 Elevated white blood cell count, unspecified: Secondary | ICD-10-CM

## 2015-07-15 LAB — GLUCOSE, CAPILLARY
GLUCOSE-CAPILLARY: 226 mg/dL — AB (ref 65–99)
GLUCOSE-CAPILLARY: 69 mg/dL (ref 65–99)
Glucose-Capillary: 104 mg/dL — ABNORMAL HIGH (ref 65–99)
Glucose-Capillary: 381 mg/dL — ABNORMAL HIGH (ref 65–99)
Glucose-Capillary: 135 mg/dL — ABNORMAL HIGH (ref 65–99)

## 2015-07-15 LAB — HEPATITIS B SURFACE ANTIGEN: HEP B S AG: NEGATIVE

## 2015-07-15 MED ORDER — SODIUM CHLORIDE 0.9 % IV SOLN
125.0000 mg | INTRAVENOUS | Status: AC
  Administered 2015-07-17 – 2015-07-22 (×3): 125 mg via INTRAVENOUS
  Filled 2015-07-15 (×6): qty 10

## 2015-07-15 NOTE — Progress Notes (Signed)
07/15/15 1701 nursing NT did in and out cath for urine sample but no urine came out; per daughter pt had HD yesterday; pt is oliguric. We'll report to next shift.

## 2015-07-15 NOTE — Progress Notes (Signed)
[facility] Progress Note  Assessment/Plan: 1. Right tib fx- s/p ORIF  2. ESRD -MWF - next HD Monday; records reviewed from outpt labs: see dialysis orders below 3. Anemia - Hgb 10 - Aranesp 60 q Fri started 4/28 - generally hasn't been on ESA - hgb have been stable uppr 11 to 12- dose venofer 100 x 3 due to pre surgical tsat of 22%-  4. Secondary hyperparathyroidism - P controlled; on vit D and phoslo- corrected Ca high ~10.9 - d/c vit D and hold phoslo for now- if P goes up add non Ca binder; previously on Hectorol 8; suspect ^ Ca due to alb drop- resume as able - normally on a 2.5 Ca bath 5. HTN/volume - controlled- actually a little lower than usual 6. Nutrition - renal carb mod diet/vits/nepro 7. Increase in LFT AST 77 4/26 - was normal 4/22 - repeat with Monday pre HD labs  Myriam Jacobson, PA-C [facility] Beeper [phone] 07/15/2015,11:59 AM  LOS: 3 days   Subjective:   C/o that leg immobilizer is uncomfortable. No problems with HD yesterday  Objective Filed Vitals:   07/14/15 1953 07/14/15 2100 07/15/15 0500 07/15/15 0521  BP: 124/36 118/34  125/30  Pulse: 67 65  59  Temp:  97.6 F (36.4 C)  98.1 F (36.7 C)  TempSrc:  Oral  Oral  Resp:  17  18  Height:      Weight:  60.9 kg (134 lb 4.2 oz) 60.5 kg (133 lb 6.1 oz)   SpO2:  99%  100%   Physical Exam General: alert sitting in chair - recognizes me Heart: RRR Lungs: no rales Abdomen: soft NT Extremities: right LE in immobilizer- left LE no sig edema Dialysis Access: right upper AVF + bruit  Dialysis Orders:  Center: [place]  and [facility] on MWF . EDW 64.5 HD Bath 3 K 2.5Ca Time 3.5hrs Heparin none. Access RUE AVF BFR 350  DFR 600 Hectorol 8 no Fe no ESA Max UF goal per hour 1300  Recent monthly labs drawn 07/08/15  hgb 12.1 4/22 ferritin 1178 Fe 54 tsat 22% iPTH 767 Ca 9.3 corrected P 5.3, AST 31 and ALT 6 alk phos 328 ; vit D level 31 01/2015  Additional  Objective Labs: Basic Metabolic Panel:  Recent Labs Lab 07/12/15 1555 07/13/15 0438 07/14/15 0503  NA  --  138 137  K  --  4.1 4.5  CL  --  95* 93*  CO2  --  30 29  GLUCOSE  --  242* 161*  BUN  --  27* 45*  CREATININE 3.00* 4.03* 6.11*  CALCIUM  --  9.1 9.5  PHOS  --   --  3.1   Liver Function Tests:  Recent Labs Lab 07/13/15 0438 07/14/15 0503  AST 77*  --   ALT 12*  --   ALKPHOS 282*  --   BILITOT 0.7  --   PROT 6.3*  --   ALBUMIN 2.3* 2.2*   CBC:  Recent Labs Lab 07/12/15 1555 07/13/15 0438 07/14/15 0503  WBC 10.1 9.9 13.1*  NEUTROABS  --  6.6  --   HGB 10.7* 9.6* 10.0*  HCT 33.7* 29.7* 30.7*  MCV 95.2 96.1 95.6  PLT 281 292 316  CBG:  Recent Labs Lab 07/14/15 0631 07/14/15 1136 07/14/15 2117 07/15/15 0640 07/15/15 1143  GLUCAP 163* 146* 129* 226* 69   Medications:   . aspirin  325 mg Oral Daily  . calcium  acetate  667 mg Oral BID WC  . cholecalciferol  5,000 Units Oral Daily  . [START ON 07/21/2015] darbepoetin (ARANESP) injection - DIALYSIS  60 mcg Intravenous Q Fri-HD  . diclofenac sodium  2 g Topical TID  . feeding supplement (NEPRO CARB STEADY)  237 mL Oral BID BM  . heparin  5,000 Units Subcutaneous Q8H  . hydrALAZINE  50 mg Oral BID  . insulin aspart  0-24 Units Subcutaneous TID AC & HS  . isosorbide mononitrate  30 mg Oral Daily  . levothyroxine  75 mcg Oral QAC breakfast  . multivitamin  1 tablet Oral QHS  . nebivolol  10 mg Oral Daily  . pravastatin  20 mg Oral q1800  . vitamin E  400 Units Oral Daily      I have seen and examined this patient and agree with plan as outlined by Amalia Hailey, PA-C.  Mrs. Villers has been having some AMS today.  CGB wnl, pox 100% on RA, attempts for urine for cx and sens but no uop.  Will also add abg to r/u hypercarbia.  Possibly related to early underlying dementia and change in health following fx and pain meds.  Currently AA&Ox3. Broadus John A Isadora Delorey,MD 07/15/2015 6:02 PM

## 2015-07-15 NOTE — Progress Notes (Signed)
07/15/15 1626 nursing MD notified of cbg at lunch time and current cbg 384 ; new order noted. Also mentioned to MD  pt's in and out confusion ; vital signs WNL. Continued to monitor patient.

## 2015-07-15 NOTE — Progress Notes (Signed)
[place] [facility]ICINE & [facility]HABILITATION     PROGRESS NOTE    Subjective/Complaints: No sweats or chills, has RLE which is improving  ROS: Pt denies fever, rash/itching, headache, blurred or double vision, nausea, vomiting, abdominal pain, diarrhea, chest pain, shortness of breath, palpitations, dysuria, dizziness, neck or back pain, bleeding, anxiety, or depression  Objective: Vital Signs: Blood pressure 125/30, pulse 59, temperature 98.1 F (36.7 C), temperature source Oral, resp. rate 18, height 5\' 1"  (1.549 m), weight 60.5 kg (133 lb 6.1 oz), SpO2 100 %. No results found.  Recent Labs  07/13/15 0438 07/14/15 0503  WBC 9.9 13.1*  HGB 9.6* 10.0*  HCT 29.7* 30.7*  PLT 292 316    Recent Labs  07/13/15 0438 07/14/15 0503  NA 138 137  K 4.1 4.5  CL 95* 93*  GLUCOSE 242* 161*  BUN 27* 45*  CREATININE 4.03* 6.11*  CALCIUM 9.1 9.5   CBG (last 3)   Recent Labs  07/14/15 1136 07/14/15 2117 07/15/15 0640  GLUCAP 146* 129* 226*    Wt Readings from Last 3 Encounters:  07/15/15 60.5 kg (133 lb 6.1 oz)  07/11/15 66.225 kg (146 lb)  01/06/14 66.996 kg (147 lb 11.2 oz)    Physical Exam:  Constitutional: She is oriented to person, place, and time. She appears well-developed and well-nourished.  HENT:  Head: Normocephalic and atraumatic.  Eyes: Conjunctivae and EOM are normal.  Neck: Normal range of motion. Neck supple. No thyromegaly present.  Cardiovascular:  Irregular irregular RUE fistula  Respiratory: Effort normal and breath sounds normal. No respiratory distress.  GI: Soft. Bowel sounds are normal. She exhibits no distension.  Musculoskeletal: She exhibits edema and tenderness. Both first cmc's tender Neurological: She is alert and oriented to person, place, and time.  A&Ox2 DTRs symmetric Sensation intact to light touch Motor: B/l UE 4/5 proximal to distal LLE: 3- proximally, 4/5 distally RLE: hip flexion 2/5, wiggles toes  Skin:  Right leg  wounds clean/dry with staples.  Leg still slightly swollen/red Psychiatric: She has a normal mood and affect. Her behavior is normal.     Assessment/Plan: 1. Functional deficits secondary to right tib/fib fx which require 3+ hours per day of interdisciplinary therapy in a comprehensive inpatient rehab setting. Physiatrist is providing close team supervision and 24 hour management of active medical problems listed below. Physiatrist and rehab team continue to assess barriers to discharge/monitor patient progress toward functional and medical goals.  Function:  Bathing Bathing position   Position: Sitting EOB  Bathing parts Body parts bathed by patient: Right arm, Left arm, Chest, Abdomen, Front perineal area, Buttocks Body parts bathed by helper: Right upper leg, Left upper leg, Right lower leg, Left lower leg, Back  Bathing assist Assist Level: Touching or steadying assistance(Pt > 75%)      Upper Body Dressing/Undressing Upper body dressing   What is the patient wearing?: Pull over shirt/dress, Bra Bra - Perfomed by patient: Thread/unthread right bra strap, Thread/unthread left bra strap Bra - Perfomed by helper: Hook/unhook bra (pull down sports bra) Pull over shirt/dress - Perfomed by patient: Thread/unthread right sleeve, Thread/unthread left sleeve, Put head through opening, Pull shirt over trunk Pull over shirt/dress - Perfomed by helper: Pull shirt over trunk        Upper body assist Assist Level: Touching or steadying assistance(Pt > 75%)      Lower Body Dressing/Undressing Lower body dressing   What is the patient wearing?: Underwear, Pants, Socks, Shoes   Underwear -  Performed by helper: Thread/unthread right underwear leg, Thread/unthread left underwear leg, Pull underwear up/down Pants- Performed by patient: Thread/unthread left pants leg Pants- Performed by helper: Thread/unthread right pants leg, Pull pants up/down       Socks - Performed by helper: Don/doff  left sock Shoes - Performed by patient: Don/doff left shoe Shoes - Performed by helper: Fasten left, Don/doff right shoe          Lower body assist Assist for lower body dressing: Touching or steadying assistance (Pt > 75%)      Toileting Toileting   Toileting steps completed by patient: Adjust clothing prior to toileting, Performs perineal hygiene, Adjust clothing after toileting   Toileting Assistive Devices: Grab bar or rail  Toileting assist Assist level: Touching or steadying assistance (Pt.75%)   Transfers Chair/bed transfer   Chair/bed transfer method: Lateral scoot Chair/bed transfer assist level: Moderate assist (Pt 50 - 74%/lift or lower) Chair/bed transfer assistive device: Armrests, Orthosis, Sliding board     Locomotion Ambulation Ambulation activity did not occur: Safety/medical concerns (unsafe on eval)         Wheelchair   Type: Manual Max wheelchair distance: 29' Assist Level: Supervision or verbal cues  Cognition Comprehension Comprehension assist level: Understands basic 90% of the time/cues < 10% of the time  Expression Expression assist level: Expresses basic 90% of the time/requires cueing < 10% of the time.  Social Interaction Social Interaction assist level: Interacts appropriately with others - No medications needed.  Problem Solving Problem solving assist level: Solves complex 90% of the time/cues < 10% of the time  Memory Memory assist level: Recognizes or recalls 90% of the time/requires cueing < 10% of the time    Medical Problem List and Plan: 1. Limited functional mobility secondary to comminuted right tibia-fibula fracture. Status post ORIF 07/07/2015. Nonweightbearing. Knee immobilizer when out of bed. No ROM precautions  -continue CIR therapies 2. DVT Prophylaxis/Anticoagulation: Subcutaneous heparin. Monitor platelet counts and any signs of bleeding 3. Pain Management: Oxycodone 5-10 mg every 4 hours as needed  -added voltaren gel  for bilateral hand OA 4. Acute blood loss anemia. hgb 9.6. Fe++ supp per nephrology 5. Neuropsych: This patient is capable of making decisions on her own behalf. 6. Skin/Wound Care: can change right leg dressing as needed 7. Fluids/Electrolytes/Nutrition: Routine I&O's with follow-up chemistries reviewed. Taking 40-75% meals 8. End-stage renal disease with hemodialysis. HD each day after therapy day. 9. PAF. Aspirin 325 mg daily. Cardiac rate control 10. Hypertension. Hydralazine 50 mg twice a day, Imdur 30 mg daily 11. Diastolic congestive heart failure. Monitor for any signs of fluid overload 12. Diabetes mellitus. Checking blood sugars before meals and at bedtime. Sliding scale insulin.   -sugars still extremely variable---continue to follow for pattern before scheduling oral med or insulin CBG (last 3)   Recent Labs  07/14/15 1136 07/14/15 2117 07/15/15 0640  GLUCAP 146* 129* 226*   13. Hypothyroidism. Synthroid 75 g daily 14. Hyperlipidemia. Pravachol 15.  Leukocytosis but afebrile, no c/o except RLE pain, will monitor , incisions look ok CBC Latest Ref Rng 07/14/2015 07/13/2015 07/12/2015  WBC 4.0 - 10.5 K/uL 13.1(H) 9.9 10.1  Hemoglobin 12.0 - 15.0 g/dL 10.0(L) 9.6(L) 10.7(L)  Hematocrit 36.0 - 46.0 % 30.7(L) 29.7(L) 33.7(L)  Platelets 150 - 400 K/uL 316 292 281     LOS (Days) 3 A FACE TO FACE EVALUATION WAS PERFORMED  KIRSTEINS,ANDREW E 07/15/2015 8:07 AM

## 2015-07-15 NOTE — Progress Notes (Signed)
RT came to do abg. Pt has right and left restricted limbs. Dr Quincy Simmonds paged and RT explained reasoning of unable to obtain abg is due to restricted limbs.  Family member at the pts bedside said the AMS is intermittent.  Per Dr Quincy Simmonds DC abg and continue to watch mental status. RN notified

## 2015-07-15 NOTE — Progress Notes (Signed)
Hypoglycemic Event  CBG: 69  Treatment: neprodrink  Symptoms: none  Follow-up CBG: Time:1215 CBG Result:104  Possible Reasons for Event: medication  Comments/MD notified:    Jillyn Ledger

## 2015-07-16 ENCOUNTER — Inpatient Hospital Stay (HOSPITAL_COMMUNITY): Payer: Medicare Other | Admitting: Occupational Therapy

## 2015-07-16 ENCOUNTER — Inpatient Hospital Stay (HOSPITAL_COMMUNITY): Payer: Medicare Other | Admitting: Physical Therapy

## 2015-07-16 DIAGNOSIS — Z4931 Encounter for adequacy testing for hemodialysis: Secondary | ICD-10-CM | POA: Diagnosis not present

## 2015-07-16 DIAGNOSIS — N186 End stage renal disease: Secondary | ICD-10-CM | POA: Diagnosis not present

## 2015-07-16 DIAGNOSIS — Z992 Dependence on renal dialysis: Secondary | ICD-10-CM | POA: Diagnosis not present

## 2015-07-16 DIAGNOSIS — E1122 Type 2 diabetes mellitus with diabetic chronic kidney disease: Secondary | ICD-10-CM | POA: Diagnosis not present

## 2015-07-16 LAB — CBC WITH DIFFERENTIAL/PLATELET
BASOS ABS: 0.1 10*3/uL (ref 0.0–0.1)
Basophils Relative: 1 %
EOS ABS: 0.4 10*3/uL (ref 0.0–0.7)
Eosinophils Relative: 3 %
HCT: 30.3 % — ABNORMAL LOW (ref 36.0–46.0)
Hemoglobin: 9.8 g/dL — ABNORMAL LOW (ref 12.0–15.0)
LYMPHS PCT: 12 %
Lymphs Abs: 1.7 10*3/uL (ref 0.7–4.0)
MCH: 31.6 pg (ref 26.0–34.0)
MCHC: 32.3 g/dL (ref 30.0–36.0)
MCV: 97.7 fL (ref 78.0–100.0)
MONO ABS: 2.7 10*3/uL — AB (ref 0.1–1.0)
Monocytes Relative: 19 %
NEUTROS PCT: 65 %
Neutro Abs: 9.3 10*3/uL — ABNORMAL HIGH (ref 1.7–7.7)
PLATELETS: 385 10*3/uL (ref 150–400)
RBC: 3.1 MIL/uL — AB (ref 3.87–5.11)
RDW: 15.5 % (ref 11.5–15.5)
WBC: 14.2 10*3/uL — AB (ref 4.0–10.5)

## 2015-07-16 LAB — GLUCOSE, CAPILLARY
GLUCOSE-CAPILLARY: 175 mg/dL — AB (ref 65–99)
GLUCOSE-CAPILLARY: 300 mg/dL — AB (ref 65–99)
GLUCOSE-CAPILLARY: 105 mg/dL — AB (ref 65–99)
Glucose-Capillary: 159 mg/dL — ABNORMAL HIGH (ref 65–99)
Glucose-Capillary: 167 mg/dL — ABNORMAL HIGH (ref 65–99)
Glucose-Capillary: 50 mg/dL — ABNORMAL LOW (ref 65–99)

## 2015-07-16 MED ORDER — GLUCOSE 40 % PO GEL
ORAL | Status: AC
  Administered 2015-07-16: 37.5 g via ORAL
  Filled 2015-07-16: qty 1

## 2015-07-16 MED ORDER — GLUCOSE 40 % PO GEL
1.0000 | Freq: Once | ORAL | Status: AC
  Administered 2015-07-16: 37.5 g via ORAL

## 2015-07-16 NOTE — Progress Notes (Signed)
Occupational Therapy Session Note  Patient Details  Name: [name] MRN: [id number] Date of Birth: Nov 25, 1928  Today's Date: 07/16/2015 OT Individual Time:  - 1430-1540 =70 minutes Missed 20 minutes OT session due to pain     Short Term Goals: Week 1:  OT Short Term Goal 1 (Week 1): Pt will complete stand pivot transfer to [facility] with mod A OT Short Term Goal 2 (Week 1): Pt will carry over transfer technique of stand pivot transfer with min cues OT Short Term Goal 3 (Week 1): Pt will don pants using AE PRN with min A OT Short Term Goal 4 (Week 1): Pt will complete toileting task with mod A.  Skilled Therapeutic Interventions/Progress Updates: upon approach for therapy, patient c/o of 9.5/10 pain in right leg and 9/10 in bilateral hands... Constant aching pain.     RN gave medication.  Though patient asked to go back to bed, she tried to participate in therapy in spite of pain level.  She c/o of too much pain to stand, and when she attempted to use her hands and do upper extremity exercises she stated, it was just too painful.       This clinician applied a heat pack to her bilateral hands to which she stated, "That is helping and feels so good."  After the session, Patient was able to transfer w/c to bed via sliding board with Mod A due to c/o of continued 9 pain in her hands.     Therapy Documentation Precautions:  Precautions Precautions: Fall Precaution Comments: HD MWF Required Braces or Orthoses: Knee Immobilizer - Right Knee Immobilizer - Right: On when out of bed or walking Restrictions Weight Bearing Restrictions: Yes RLE Weight Bearing: Non weight bearing  Pain: 9.5/10 in right leg and 9/10 in bilateral hands.   RN gave pain medication Pain Assessment Pain Assessment: 0-10 Pain Score: 9  Pain Location: Leg Pain Orientation: Right Pain Descriptors / Indicators: Aching Pain Frequency: Intermittent Pain Onset: With Activity Pain Intervention(s): Medication (See  eMAR)  See Function Navigator for Current Functional Status.   Therapy/Group: Individual Therapy  Alfredia Ferguson [facility] 07/16/2015, 3:05 PM

## 2015-07-16 NOTE — Progress Notes (Signed)
Social Work Patient ID: [name], female   DOB: Mar 30, 1928, 82 y.o.   MRN: [id number]   Attempted to complete assessment interview with pt today, however, already taken to HD.  Will see on Monday.  Jaimarie Rapozo, LCSW

## 2015-07-16 NOTE — Progress Notes (Signed)
[place] PHYSICAL MEDICINE & REHABILITATION     PROGRESS NOTE    Subjective/Complaints: Per RN seems confused at times THis am is pleasanat and cooperative, oriented to person place and situation  ROS: Pt denies fever, rash/itching, headache, blurred or double vision, nausea, vomiting, abdominal pain, diarrhea, chest pain, shortness of breath, palpitations, dysuria, dizziness, neck or back pain, bleeding, anxiety, or depression  Objective: Vital Signs: Blood pressure 125/35, pulse 55, temperature 98 F (36.7 C), temperature source Oral, resp. rate 16, height 5\' 1"  (1.549 m), weight 61 kg (134 lb 7.7 oz), SpO2 98 %. No results found.  Recent Labs  07/14/15 0503 07/16/15 0732  WBC 13.1* PENDING  HGB 10.0* 9.8*  HCT 30.7* 30.3*  PLT 316 385    Recent Labs  07/14/15 0503  NA 137  K 4.5  CL 93*  GLUCOSE 161*  BUN 45*  CREATININE 6.11*  CALCIUM 9.5   CBG (last 3)   Recent Labs  07/15/15 1611 07/15/15 2142 07/16/15 0646  GLUCAP 381* 135* 159*    Wt Readings from Last 3 Encounters:  07/16/15 61 kg (134 lb 7.7 oz)  07/11/15 66.225 kg (146 lb)  01/06/14 66.996 kg (147 lb 11.2 oz)    Physical Exam:  Constitutional: She is oriented to person, place, and time. She appears well-developed and well-nourished.  HENT:  Head: Normocephalic and atraumatic.  Eyes: Conjunctivae and EOM are normal.  Neck: Normal range of motion. Neck supple. No thyromegaly present.  Cardiovascular:  Irregular irregular RUE fistula  Respiratory: Effort normal and breath sounds normal. No respiratory distress.  GI: Soft. Bowel sounds are normal. She exhibits no distension.  Musculoskeletal: She exhibits edema and tenderness. Both first cmc's tender Neurological: She is alert and oriented to person, place, and time.  A&Ox2 DTRs symmetric Sensation intact to light touch Motor: B/l UE 4/5 proximal to distal LLE: 3- proximally, 4/5 distally RLE: hip flexion 2/5, wiggles toes  Skin:   Right leg wounds clean/dry with staples.  Leg still slightly swollen, bruised sm amt dried blood on telfa Psychiatric: She has a normal mood and affect. Her behavior is normal.     Assessment/Plan: 1. Functional deficits secondary to right tib/fib fx which require 3+ hours per day of interdisciplinary therapy in a comprehensive inpatient rehab setting. Physiatrist is providing close team supervision and 24 hour management of active medical problems listed below. Physiatrist and rehab team continue to assess barriers to discharge/monitor patient progress toward functional and medical goals.  Function:  Bathing Bathing position   Position: Sitting EOB  Bathing parts Body parts bathed by patient: Right arm, Left arm, Chest, Abdomen, Front perineal area, Buttocks Body parts bathed by helper: Right upper leg, Left upper leg, Right lower leg, Left lower leg, Back  Bathing assist Assist Level: Touching or steadying assistance(Pt > 75%)      Upper Body Dressing/Undressing Upper body dressing   What is the patient wearing?: Pull over shirt/dress Bra - Perfomed by patient: Thread/unthread right bra strap, Thread/unthread left bra strap Bra - Perfomed by helper: Thread/unthread right bra strap, Thread/unthread left bra strap Pull over shirt/dress - Perfomed by patient: Thread/unthread right sleeve, Thread/unthread left sleeve, Put head through opening, Pull shirt over trunk Pull over shirt/dress - Perfomed by helper: Pull shirt over trunk        Upper body assist Assist Level: Touching or steadying assistance(Pt > 75%)      Lower Body Dressing/Undressing Lower body dressing   What is the patient wearing?:  Underwear, Pants, Socks, Shoes   Underwear - Performed by helper: Thread/unthread right underwear leg, Thread/unthread left underwear leg, Pull underwear up/down Pants- Performed by patient: Thread/unthread left pants leg Pants- Performed by helper: Pull pants up/down       Socks -  Performed by helper: Don/doff left sock Shoes - Performed by patient: Don/doff left shoe Shoes - Performed by helper: Fasten left, Don/doff right shoe          Lower body assist Assist for lower body dressing: Touching or steadying assistance (Pt > 75%)      Toileting Toileting   Toileting steps completed by patient: Adjust clothing prior to toileting, Performs perineal hygiene, Adjust clothing after toileting   Toileting Assistive Devices: Grab bar or rail  Toileting assist Assist level: Touching or steadying assistance (Pt.75%)   Transfers Chair/bed transfer   Chair/bed transfer method: Lateral scoot Chair/bed transfer assist level: Moderate assist (Pt 50 - 74%/lift or lower) Chair/bed transfer assistive device: Armrests, Orthosis, Sliding board     Locomotion Ambulation Ambulation activity did not occur: Safety/medical concerns (unsafe on eval)         Wheelchair   Type: Manual Max wheelchair distance: 36' Assist Level: Supervision or verbal cues  Cognition Comprehension Comprehension assist level: Understands basic 90% of the time/cues < 10% of the time  Expression Expression assist level: Expresses basic 90% of the time/requires cueing < 10% of the time.  Social Interaction Social Interaction assist level: Interacts appropriately with others - No medications needed.  Problem Solving Problem solving assist level: Solves complex 90% of the time/cues < 10% of the time  Memory Memory assist level: Recognizes or recalls 90% of the time/requires cueing < 10% of the time    Medical Problem List and Plan: 1. Limited functional mobility secondary to comminuted right tibia-fibula fracture. Status post ORIF 07/07/2015. Nonweightbearing. Knee immobilizer when out of bed. No ROM precautions  -continue CIR therapies 2. DVT Prophylaxis/Anticoagulation: Subcutaneous heparin. Monitor platelet counts and any signs of bleeding 3. Pain Management: Oxycodone 5-10 mg every 4 hours as  needed  -added voltaren gel for bilateral hand OA 4. Acute blood loss anemia. hgb 9.6. Fe++ supp per nephrology 5. Neuropsych: This patient is capable of making decisions on her own behalf.She is oriented to person place and month, not taking much in terms of pain med per RN, may benefit from neuropsych eval 6. Skin/Wound Care: can change right leg dressing as needed 7. Fluids/Electrolytes/Nutrition: Routine I&O's with follow-up chemistries reviewed. Taking 40-75% meals 8. End-stage renal disease with hemodialysis. HD each day after therapy day. 9. PAF. Aspirin 325 mg daily. Cardiac rate control 10. Hypertension. Hydralazine 50 mg twice a day, Imdur 30 mg daily 11. Diastolic congestive heart failure. Monitor for any signs of fluid overload 12. Diabetes mellitus. Checking blood sugars before meals and at bedtime. Sliding scale insulin.   -sugars still extremely variable---continue to follow for pattern before scheduling oral med or insulin CBG (last 3)   Recent Labs  07/15/15 1611 07/15/15 2142 07/16/15 0646  GLUCAP 381* 135* 159*   13. Hypothyroidism. Synthroid 75 g daily 14. Hyperlipidemia. Pravachol 15.  Leukocytosis but afebrile, no c/o except RLE pain, will monitor , incisions look ok, repeat WBC pending CBC Latest Ref Rng 07/16/2015 07/14/2015 07/13/2015  WBC 4.0 - 10.5 K/uL PENDING 13.1(H) 9.9  Hemoglobin 12.0 - 15.0 g/dL 9.8(L) 10.0(L) 9.6(L)  Hematocrit 36.0 - 46.0 % 30.3(L) 30.7(L) 29.7(L)  Platelets 150 - 400 K/uL 385 316 292  LOS (Days) 4 A FACE TO FACE EVALUATION WAS PERFORMED  Rozell Kettlewell E 07/16/2015 8:22 AM

## 2015-07-16 NOTE — Progress Notes (Signed)
Physical Therapy Session Note  Patient Details  Name: [name] MRN: [id number] Date of Birth: [birth year]  Today's Date: 07/16/2015 PT Individual Time: 0950-1105 and 1400-1431 PT Individual Time Calculation (min): 75 min and 31 minutes  Short Term Goals: Week 1:  PT Short Term Goal 1 (Week 1): Pt will be able to perform basic transfers with min assist PT Short Term Goal 2 (Week 1): Pt will be able to perform sit <> stands with min assist PT Short Term Goal 3 (Week 1): Pt will be able to propel w/c x 150' with supervision PT Short Term Goal 4 (Week 1): Pt will be able to initiate short distance gait  Skilled Therapeutic Interventions/Progress Updates:    Treatment 1: Pt received in bed & agreeable to PT. Pt reported no pain at rest but 9/10 with movement. Pt in bed without brief or pants on & PT assisted pt with donning both total A. Pt able to roll L<>R with min A for maintaining NWB R LE & pt utilizing bed rails & extra time. PT also donned R knee immobilizer total A in supine with pt reporting pain in RLE with movement. Pt transferred supine>sit with bed flat & bed rails with Min A due to PT supporting RLE. Sitting at EOB PT placed sliding board total A but pt with R lateral lean and able to lift LLE to assist PT with placing board. Pt completed sliding board transfer bed>w/c with Min A due to PT supporting RLE but otherwise pt able to complete transfer. Once in w/c pt able to remove sliding board with supervision & required cuing to scoot to center of chair to position armrest. Pt requested to complete grooming tasks at sink and did so at w/c level. Pt self propelled w/c with BUE from room>gym x 180 ft with maximum encouragement & frequent cuing to continue as pt requested rest break every 5 ft or so. PT also educated pt to push w/c with BUE on wheels instead of only 1 UE or by pulling on rail in hallway. Pt c/o shoulder/neck/arm pain with activity & required rest break afterwards. BP (in  LLE) = 132/61 mmHg, HR = 52 bpm, SpO2 = 100% on room air. Educated pt on seated upright posture with focus of this during seated therapeutic exercises. Pt completed LLE long arc quads with 2# weights & hamstring curls with orange theraband. Pt required multimodal cuing for proper technique to gain maximum benefits & target specific muscle groups during exercises. At one point during task pt became frustrated, stating "I just don't feel good!" & upon further discussion with pt she reported she was tired. Pt also required consistent cuing for upright sitting posture during task. At end of session pt returned to room & left in w/c with QRB in place & all needs within reach.    During beginning of session pt unable to recall NWB RLE even with education from PT. Pt asked when she would be able to stand on RLE & PT educated pt on need for MD clearance to do so. At end of session pt unable to recall NWB RLE & PT re-educated pt.   Treatment 2: Pt received in bed, reporting 8/10 R LE pain and requested pain medication. Pt agreed to participate in treatment with encouragement from PT & NT. Pt transferred supine>sit with use of bed rails & steady A. Pt with increasing pain with RLE in dependent position & pt had to provide support for extremity. PT positioned sliding  board & pt completed bed>w/c via sliding board transfer with Min A with PT supporting RLE & cuing to scoot back as pt began to scoot forwards. Once in w/c PT transported pt to gym via w/c total A for time management & pt able to recall she was not allowed to put any weight on RLE with questioning cuing from PT. In parallel bars PT provided demonstration for sit<>stand while NWB RLE. Pt attempted sit<>stand for several attempts with various hand placement on w/c armrest(s) & parallel bar but pt reported L hand had no strength & was sore. Pt unable to transfer to standing even with max A from PT & supporting RLE. Sitting in w/c pt instructed to push up with BUE  and clear bottom off of seat for strengthening to progress to sit<>stand. Knee immobilizer slid down & PT repositioned immobilizer total A with pt wishing to scratch leg but therapist instructing her to rub it. Pt then self propelled w/c x 30 ft with supervision & cuing to avoid obstacles on R side. At end of session pt left in w/c with hand off to OT.  Therapy Documentation Precautions:  Precautions Precautions: Fall Precaution Comments: HD MWF Required Braces or Orthoses: Knee Immobilizer - Right Knee Immobilizer - Right: On when out of bed or walking Restrictions Weight Bearing Restrictions: Yes RLE Weight Bearing: Non weight bearing  Pain: Pain Assessment Pain Assessment: 0-10 Pain Score: 9  Pain Location: Leg Pain Orientation: Right Pain Onset: With Activity Pain Intervention(s): Repositioned;RN made aware   See Function Navigator for Current Functional Status.   Therapy/Group: Individual Therapy  Waunita Schooner 07/16/2015, 10:17 AM

## 2015-07-16 NOTE — Progress Notes (Signed)
   Hypoglycemic Event  CBG: *50**  Treatment: 1 tube instant glucose  Symptoms: None  Follow-up CBG: Time:2245 CBG Result:105   Possible Reasons for Event: Medication regimen: novolog    Comments/MD notified:no    Renato Gails

## 2015-07-17 ENCOUNTER — Encounter (HOSPITAL_COMMUNITY): Payer: Medicare Other

## 2015-07-17 ENCOUNTER — Inpatient Hospital Stay (HOSPITAL_COMMUNITY): Payer: Medicare Other

## 2015-07-17 ENCOUNTER — Inpatient Hospital Stay (HOSPITAL_COMMUNITY): Payer: Medicare Other | Admitting: Physical Therapy

## 2015-07-17 ENCOUNTER — Inpatient Hospital Stay (HOSPITAL_COMMUNITY): Payer: Medicare Other | Admitting: Occupational Therapy

## 2015-07-17 DIAGNOSIS — R413 Other amnesia: Secondary | ICD-10-CM

## 2015-07-17 LAB — RENAL FUNCTION PANEL
ANION GAP: 16 — AB (ref 5–15)
Albumin: 2.3 g/dL — ABNORMAL LOW (ref 3.5–5.0)
BUN: 52 mg/dL — ABNORMAL HIGH (ref 6–20)
CALCIUM: 9.3 mg/dL (ref 8.9–10.3)
CO2: 25 mmol/L (ref 22–32)
Chloride: 94 mmol/L — ABNORMAL LOW (ref 101–111)
Creatinine, Ser: 7.68 mg/dL — ABNORMAL HIGH (ref 0.44–1.00)
GFR calc non Af Amer: 4 mL/min — ABNORMAL LOW (ref >60)
GFR, EST AFRICAN AMERICAN: 5 mL/min — AB (ref >60)
GLUCOSE: 222 mg/dL — AB (ref 65–99)
POTASSIUM: 4.1 mmol/L (ref 3.5–5.1)
Phosphorus: 3.9 mg/dL (ref 2.5–4.6)
Sodium: 135 mmol/L (ref 135–145)

## 2015-07-17 LAB — CBC
HCT: 29 % — ABNORMAL LOW (ref 36.0–46.0)
Hemoglobin: 9.5 g/dL — ABNORMAL LOW (ref 12.0–15.0)
MCH: 31.1 pg (ref 26.0–34.0)
MCHC: 32.8 g/dL (ref 30.0–36.0)
MCV: 95.1 fL (ref 78.0–100.0)
Platelets: 391 10*3/uL (ref 150–400)
RBC: 3.05 MIL/uL — ABNORMAL LOW (ref 3.87–5.11)
RDW: 15.5 % (ref 11.5–15.5)
WBC: 12.5 10*3/uL — ABNORMAL HIGH (ref 4.0–10.5)

## 2015-07-17 LAB — GLUCOSE, CAPILLARY
GLUCOSE-CAPILLARY: 130 mg/dL — AB (ref 65–99)
GLUCOSE-CAPILLARY: 266 mg/dL — AB (ref 65–99)
Glucose-Capillary: 183 mg/dL — ABNORMAL HIGH (ref 65–99)
Glucose-Capillary: 142 mg/dL — ABNORMAL HIGH (ref 65–99)

## 2015-07-17 LAB — PHOSPHORUS: Phosphorus: 3.9 mg/dL (ref 2.5–4.6)

## 2015-07-17 MED ORDER — ALTEPLASE 2 MG IJ SOLR
2.0000 mg | Freq: Once | INTRAMUSCULAR | Status: DC | PRN
  Filled 2015-07-17: qty 2

## 2015-07-17 MED ORDER — GLIMEPIRIDE 1 MG PO TABS
1.0000 mg | ORAL_TABLET | Freq: Every day | ORAL | Status: DC
  Administered 2015-07-18 – 2015-07-20 (×3): 1 mg via ORAL
  Filled 2015-07-17 (×3): qty 1

## 2015-07-17 MED ORDER — LIDOCAINE HCL (PF) 1 % IJ SOLN: 5.0000 mL | INTRAMUSCULAR | Status: DC | PRN

## 2015-07-17 MED ORDER — HEPARIN SODIUM (PORCINE) 1000 UNIT/ML DIALYSIS
1000.0000 [IU] | INTRAMUSCULAR | Status: DC | PRN
  Filled 2015-07-17: qty 1

## 2015-07-17 MED ORDER — LIDOCAINE-PRILOCAINE 2.5-2.5 % EX CREA
1.0000 "application " | TOPICAL_CREAM | CUTANEOUS | Status: DC | PRN
  Filled 2015-07-17: qty 5

## 2015-07-17 MED ORDER — SODIUM CHLORIDE 0.9 % IV SOLN: 100.0000 mL | INTRAVENOUS | Status: DC | PRN

## 2015-07-17 MED ORDER — PENTAFLUOROPROP-TETRAFLUOROETH EX AERO: 1.0000 "application " | INHALATION_SPRAY | CUTANEOUS | Status: DC | PRN

## 2015-07-17 MED ORDER — ACETAMINOPHEN 325 MG PO TABS
ORAL_TABLET | ORAL | Status: AC
  Administered 2015-07-17: 650 mg via ORAL
  Filled 2015-07-17: qty 2

## 2015-07-17 NOTE — Progress Notes (Signed)
Dialysis treatment completed.  2000 mL ultrafiltrated and net fluid removal 1500 mL.    Patient status unchanged, intermittent confusion. Lung sounds diminished to ausculation in all fields. Generalized edema. Cardiac: Sinus bradycardia.  Disconnected lines and removed needles.  Pressure held for 10 minutes and band aid/gauze dressing applied.  Report given to bedside RN, Pryor Montes.

## 2015-07-17 NOTE — Care Management Note (Signed)
Inpatient Rehabilitation Center Individual Statement of Services  Patient Name:  [name]  Date:  07/17/2015  Welcome to the [facility].  Our goal is to provide you with an individualized program based on your diagnosis and situation, designed to meet your specific needs.  With this comprehensive rehabilitation program, you will be expected to participate in at least 3 hours of rehabilitation therapies Monday-Friday, with modified therapy programming on the weekends.  Your rehabilitation program will include the following services:  Physical Therapy (PT), Occupational Therapy (OT), 24 hour per day rehabilitation nursing, Therapeutic Recreaction (TR), Neuropsychology, Case Management (Social Worker), Rehabilitation Medicine, Nutrition Services and Pharmacy Services  Weekly team conferences will be held on Tuesdays to discuss your progress.  Your Social Worker will talk with you frequently to get your input and to update you on team discussions.  Team conferences with you and your family in attendance may also be held.  Expected length of stay: 2 weeks  Overall anticipated outcome: supervision  Depending on your progress and recovery, your program may change. Your Social Worker will coordinate services and will keep you informed of any changes. Your Social Worker's name and contact numbers are listed  below.  The following services may also be recommended but are not provided by the [facility] will be made to provide these services after discharge if needed.  Arrangements include referral to agencies that provide these services.  Your insurance has been verified to be:  Medicare and [employer] Your primary doctor is:  Dr. Asa Lente  Pertinent information will be shared with your doctor and your insurance company.  Social Worker:   [place], [place] or [phone]   Information discussed with and copy given to patient by: Lennart Pall, 07/17/2015, 3:30 PM

## 2015-07-17 NOTE — Plan of Care (Signed)
Problem: RH Ambulation Goal: LTG Patient will ambulate in controlled environment (PT) LTG: Patient will ambulate in a controlled environment, # of feet with assistance (PT).  D/c due to lack of progress. ABG

## 2015-07-17 NOTE — Plan of Care (Signed)
Problem: RH SKIN INTEGRITY Goal: RH STG ABLE TO PERFORM INCISION/WOUND CARE W/ASSISTANCE STG Able To Perform Incision/Wound Care With [employer].  Outcome: Not Progressing Staff performs incision care

## 2015-07-17 NOTE — Progress Notes (Signed)
  [facility] Progress Note   Subjective: left> R hand are both sore, esp left, no sob or cough  Filed Vitals:   07/16/15 1411 07/16/15 2025 07/16/15 2100 07/17/15 0519  BP: 134/46 106/37 120/48 132/43  Pulse: 51 50 50 56  Temp: 97.7 F (36.5 C)   97.8 F (36.6 C)  TempSrc: Oral   Oral  Resp: 18   17  Height:      Weight:    60.1 kg (132 lb 7.9 oz)  SpO2: 98%   100%    Inpatient medications: . aspirin  325 mg Oral Daily  . [START ON 07/21/2015] darbepoetin (ARANESP) injection - DIALYSIS  60 mcg Intravenous Q Fri-HD  . diclofenac sodium  2 g Topical TID  . feeding supplement (NEPRO CARB STEADY)  237 mL Oral BID BM  . ferric gluconate (FERRLECIT/NULECIT) IV  125 mg Intravenous Q M,W,F-HD  . glimepiride  1 mg Oral Q breakfast  . heparin  5,000 Units Subcutaneous Q8H  . hydrALAZINE  50 mg Oral BID  . insulin aspart  0-24 Units Subcutaneous TID AC & HS  . isosorbide mononitrate  30 mg Oral Daily  . levothyroxine  75 mcg Oral QAC breakfast  . multivitamin  1 tablet Oral QHS  . nebivolol  10 mg Oral Daily  . pravastatin  20 mg Oral q1800  . vitamin E  400 Units Oral Daily     acetaminophen, ondansetron **OR** ondansetron (ZOFRAN) IV, oxyCODONE, sorbitol  Exam: Alert elderly AAF No jvd Chest clear bilat RRR no mrg Abd soft +bs RUA AVF +bruit  Dialysis: [facility] / also [facility]  MWF 3.5h  64.5kg   2/2.5 bath  Hep std  RUA AVF        Assessment: 1 R tibial fx sp ORIF 2 Debility on rehab 3 ESRD  4 Anemia cont darbe 5 MBD cont binders 6 Access using RUA AVF  Plan - HD today   Kelly Splinter MD [facility] pager [phone]    cell [phone] 07/17/2015, 11:35 AM    Recent Labs Lab 07/12/15 1555 07/13/15 0438 07/14/15 0503  NA  --  138 137  K  --  4.1 4.5  CL  --  95* 93*  CO2  --  30 29  GLUCOSE  --  242* 161*  BUN  --  27* 45*  CREATININE 3.00* 4.03* 6.11*  CALCIUM  --  9.1 9.5  PHOS  --   --  3.1    Recent Labs Lab  07/13/15 0438 07/14/15 0503  AST 77*  --   ALT 12*  --   ALKPHOS 282*  --   BILITOT 0.7  --   PROT 6.3*  --   ALBUMIN 2.3* 2.2*    Recent Labs Lab 07/13/15 0438 07/14/15 0503 07/16/15 0732  WBC 9.9 13.1* 14.2*  NEUTROABS 6.6  --  9.3*  HGB 9.6* 10.0* 9.8*  HCT 29.7* 30.7* 30.3*  MCV 96.1 95.6 97.7  PLT 292 316 385

## 2015-07-17 NOTE — Progress Notes (Signed)
Physical Therapy Session Note  Patient Details  Name: [name] MRN: [id number] Date of Birth: May 28, 1928  Today's Date: 07/17/2015 PT Individual Time: 0800-0900 PT Individual Time Calculation (min): 60 min   Short Term Goals: Week 1:  PT Short Term Goal 1 (Week 1): Pt will be able to perform basic transfers with min assist PT Short Term Goal 2 (Week 1): Pt will be able to perform sit <> stands with min assist PT Short Term Goal 3 (Week 1): Pt will be able to propel w/c x 150' with supervision PT Short Term Goal 4 (Week 1): Pt will be able to initiate short distance gait  Skilled Therapeutic Interventions/Progress Updates:    Pt requesting to perform bathing prior to donning clothes and pt did not have OT session until PM - "That just won't work." Focused on functional bed mobility, sitting balance EOB while performing bathing with basin set-up next to patient and dressing EOB and in supine positions, overall activity tolerance/endurance, and functional transfer OOB using slideboard.  Pt performing lateral leans for hygiene and dressing from seated position with balance mod I. Pt required min assist by PT to support RLE during all transitional movements and total A to don KI to prepare for transfers. Pt required mod verbal cues for sequencing for slideboard transfer with min assist into w/c. Pt with poor recall of WB restrictions and carryover of short term memory during session. Rest breaks needed due to fatigue.   Therapy Documentation Precautions:  Precautions Precautions: Fall Precaution Comments: HD MWF Required Braces or Orthoses: Knee Immobilizer - Right Knee Immobilizer - Right: On when out of bed or walking Restrictions Weight Bearing Restrictions: Yes RLE Weight Bearing: Non weight bearing   Pain: Denies pain at rest except for L hand but increasing pain in RLE with mobility.   See Function Navigator for Current Functional Status.   Therapy/Group: Individual  Therapy  Canary Brim Ivory Broad, PT, DPT  07/17/2015, 9:03 AM

## 2015-07-17 NOTE — Plan of Care (Signed)
Problem: RH Balance Goal: LTG Patient will maintain dynamic standing balance (PT) LTG: Patient will maintain dynamic standing balance with assistance during mobility activities (PT)  Modified due to slow progress ABG

## 2015-07-17 NOTE — Progress Notes (Signed)
Patient arrived to unit per bed.  Reviewed treatment plan and this RN agrees.  Report received from bedside RN, Pryor Montes.  Consent verified.  Patient A & o X 4, baseline intermittent confusion. Lung sounds clear to ausculation in all fields. Generazlied edema. Cardiac: sinus brady.  Prepped RUAVF with alcohol and cannulated with two 15 gauge needles.  Pulsation of blood noted.  Flushed access well with saline per protocol.  Connected and secured lines and initiated tx at 1506.  UF goal of 2500 mL and net fluid removal of 2000 mL.  Will continue to monitor.

## 2015-07-17 NOTE — Progress Notes (Signed)
[place] PHYSICAL MEDICINE & REHABILITATION     PROGRESS NOTE    Subjective/Complaints: Alert, working with PT this morning. Right hand better. Left thumb still sore. Sugars high in pm    ROS: Pt denies fever, rash/itching, headache, blurred or double vision, nausea, vomiting, abdominal pain, diarrhea, chest pain, shortness of breath, palpitations, dysuria, dizziness, neck or back pain, bleeding, anxiety, or depression  Objective: Vital Signs: Blood pressure 132/43, pulse 56, temperature 97.8 F (36.6 C), temperature source Oral, resp. rate 17, height 5\' 1"  (1.549 m), weight 60.1 kg (132 lb 7.9 oz), SpO2 100 %. No results found.  Recent Labs  07/16/15 0732  WBC 14.2*  HGB 9.8*  HCT 30.3*  PLT 385   No results for input(s): NA, K, CL, GLUCOSE, BUN, CREATININE, CALCIUM in the last 72 hours.  Invalid input(s): CO CBG (last 3)   Recent Labs  07/16/15 2202 07/16/15 2252 07/17/15 0646  GLUCAP 50* 105* 183*    Wt Readings from Last 3 Encounters:  07/17/15 60.1 kg (132 lb 7.9 oz)  07/11/15 66.225 kg (146 lb)  01/06/14 66.996 kg (147 lb 11.2 oz)    Physical Exam:  Constitutional: She is oriented to person, place, and time. She appears well-developed and well-nourished.  HENT:  Head: Normocephalic and atraumatic.  Eyes: Conjunctivae and EOM are normal.  Neck: Normal range of motion. Neck supple. No thyromegaly present.  Cardiovascular:  Irregular irregular RUE fistula  Respiratory: Effort normal and breath sounds normal. No respiratory distress.  GI: Soft. Bowel sounds are normal. She exhibits no distension.  Musculoskeletal: She exhibits edema and tenderness. Both first cmc's tender Neurological: She is alert and oriented to person, place, and time.  A&Ox2 DTRs symmetric Sensation intact to light touch Motor: B/l UE 4/5 proximal to distal LLE: 3- proximally, 4/5 distally RLE: hip flexion 2/5, wiggles toes  Skin:  Right leg wounds clean/dry with staples.  Leg  still slightly swollen, bruised sm amt dried blood on telfa Psychiatric: She has a normal mood and affect. Her behavior is normal.     Assessment/Plan: 1. Functional deficits secondary to right tib/fib fx which require 3+ hours per day of interdisciplinary therapy in a comprehensive inpatient rehab setting. Physiatrist is providing close team supervision and 24 hour management of active medical problems listed below. Physiatrist and rehab team continue to assess barriers to discharge/monitor patient progress toward functional and medical goals.  Function:  Bathing Bathing position   Position: Sitting EOB  Bathing parts Body parts bathed by patient: Right arm, Left arm, Chest, Abdomen, Front perineal area, Buttocks Body parts bathed by helper: Right upper leg, Left upper leg, Right lower leg, Left lower leg, Back  Bathing assist Assist Level: Touching or steadying assistance(Pt > 75%)      Upper Body Dressing/Undressing Upper body dressing   What is the patient wearing?: Pull over shirt/dress Bra - Perfomed by patient: Thread/unthread right bra strap, Thread/unthread left bra strap Bra - Perfomed by helper: Thread/unthread right bra strap, Thread/unthread left bra strap Pull over shirt/dress - Perfomed by patient: Thread/unthread right sleeve, Thread/unthread left sleeve, Put head through opening, Pull shirt over trunk Pull over shirt/dress - Perfomed by helper: Pull shirt over trunk        Upper body assist Assist Level: Touching or steadying assistance(Pt > 75%)      Lower Body Dressing/Undressing Lower body dressing   What is the patient wearing?: Underwear, Pants, Socks, Shoes   Underwear - Performed by helper: Thread/unthread right underwear  leg, Thread/unthread left underwear leg, Pull underwear up/down Pants- Performed by patient: Thread/unthread left pants leg Pants- Performed by helper: Pull pants up/down       Socks - Performed by helper: Don/doff left sock Shoes  - Performed by patient: Don/doff left shoe Shoes - Performed by helper: Fasten left, Don/doff right shoe          Lower body assist Assist for lower body dressing: Touching or steadying assistance (Pt > 75%)      Toileting Toileting   Toileting steps completed by patient: Adjust clothing prior to toileting, Performs perineal hygiene, Adjust clothing after toileting   Toileting Assistive Devices: Grab bar or rail  Toileting assist Assist level: Touching or steadying assistance (Pt.75%)   Transfers Chair/bed transfer   Chair/bed transfer method: Lateral scoot Chair/bed transfer assist level: Touching or steadying assistance (Pt > 75%) Chair/bed transfer assistive device: Sliding board, Armrests     Locomotion Ambulation Ambulation activity did not occur: Safety/medical concerns (unsafe on eval)         Wheelchair   Type: Manual Max wheelchair distance: 180 Assist Level: Supervision or verbal cues  Cognition Comprehension Comprehension assist level: Understands basic 75 - 89% of the time/ requires cueing 10 - 24% of the time  Expression Expression assist level: Expresses basic 75 - 89% of the time/requires cueing 10 - 24% of the time. Needs helper to occlude trach/needs to repeat words.  Social Interaction Social Interaction assist level: Interacts appropriately 75 - 89% of the time - Needs redirection for appropriate language or to initiate interaction.  Problem Solving Problem solving assist level: Solves basic 50 - 74% of the time/requires cueing 25 - 49% of the time  Memory Memory assist level: Recognizes or recalls 50 - 74% of the time/requires cueing 25 - 49% of the time    Medical Problem List and Plan: 1. Limited functional mobility secondary to comminuted right tibia-fibula fracture. Status post ORIF 07/07/2015. Nonweightbearing. Knee immobilizer when out of bed. No ROM precautions  -continue CIR therapies 2. DVT Prophylaxis/Anticoagulation: Subcutaneous heparin.  Monitor platelet counts and any signs of bleeding 3. Pain Management: Oxycodone 5-10 mg every 4 hours as needed  -added voltaren gel for bilateral hand OA 4. Acute blood loss anemia. hgb 9.6. Fe++ supp per nephrology 5. Neuropsych: This patient is capable of making decisions on her own behalf.She is oriented to person place and month, not taking much in terms of pain med per RN, may benefit from neuropsych eval 6. Skin/Wound Care: can change right leg dressing as needed  -will remove staples over next 2-3 days 7. Fluids/Electrolytes/Nutrition: Routine I&O's with follow-up chemistries reviewed. Taking 40-75% meals 8. End-stage renal disease with hemodialysis. HD each day after therapy day. 9. PAF. Aspirin 325 mg daily. Cardiac rate control 10. Hypertension. Hydralazine 50 mg twice a day, Imdur 30 mg daily 11. Diastolic congestive heart failure. Monitor for any signs of fluid overload 12. Diabetes mellitus. Checking blood sugars before meals and at bedtime. Sliding scale insulin.   -sugars higher in PM--had reactive hypoglycemia after rx of sugar  -add am amaryl and avoid over reating sugars CBG (last 3)   Recent Labs  07/16/15 2202 07/16/15 2252 07/17/15 0646  GLUCAP 50* 105* 183*   13. Hypothyroidism. Synthroid 75 g daily 14. Hyperlipidemia. Pravachol 15.  Leukocytosis but afebrile, no c/o except RLE pain, will monitor , incisions look ok.  -ua/cx not done---re ordered  CBC Latest Ref Rng 07/16/2015 07/14/2015 07/13/2015  WBC 4.0 - 10.5  K/uL 14.2(H) 13.1(H) 9.9  Hemoglobin 12.0 - 15.0 g/dL 9.8(L) 10.0(L) 9.6(L)  Hematocrit 36.0 - 46.0 % 30.3(L) 30.7(L) 29.7(L)  Platelets 150 - 400 K/uL 385 316 292     LOS (Days) 5 A FACE TO FACE EVALUATION WAS PERFORMED  Nancy Arvin T 07/17/2015 8:36 AM

## 2015-07-17 NOTE — Progress Notes (Signed)
Physical Therapy Session Note  Patient Details  Name: [name] MRN: [id number] Date of Birth: [birth year]  Today's Date: 07/17/2015 PT Individual Time: 1000-1100 PT Individual Time Calculation (min): 60 min   Short Term Goals: Week 1:  PT Short Term Goal 1 (Week 1): Pt will be able to perform basic transfers with min assist PT Short Term Goal 2 (Week 1): Pt will be able to perform sit <> stands with min assist PT Short Term Goal 3 (Week 1): Pt will be able to propel w/c x 150' with supervision PT Short Term Goal 4 (Week 1): Pt will be able to initiate short distance gait  Skilled Therapeutic Interventions/Progress Updates:   Patient in wheelchair with [place] donned. Instructed in wheelchair propulsion in hallway using BUE with increased L hand pain with S-min A, transitioned to RUE only with min A, until R hand became sore as well for total of 50 ft and transported rest of way to gym total A. Performed slide board transfer wheelchair > mat table with min A overall and assist to support RLE NWB with max multimodal cues for hand placement, head hips relationship, and and anterior weight shift to push through LLE. Patient required assist to place slide board for transfer. Patient transferred to supine on mat with min A for RLE. Instructed in supine BLE therex: ankle pumps x 20, assisted RLE/unassisted LLE SLR x 10 each, assisted RLE/unassisted LLE hip abduction/adduction x 10 each, LLE heel slides x 10, and glute sets x 10. Patient provided gloves to assist with B hand pain for transfers and wheelchair propulsion. Patient transferred supine > long sit > sitting EOM with min A for RLE and able to place slide board with max verbal cues, performed slide board transfer back to wheelchair with min verbal cues demonstrating improved bottom clearance and technique. Patient propelled wheelchair back to room with gloves donned x 180 ft with supervision, increased time, and intermittent rest breaks as needed.  Patient left sitting in wheelchair with all needs in reach.   Therapy Documentation Precautions:  Precautions Precautions: Fall Precaution Comments: HD MWF Required Braces or Orthoses: Knee Immobilizer - Right Knee Immobilizer - Right: On when out of bed or walking Restrictions Weight Bearing Restrictions: Yes RLE Weight Bearing: Non weight bearing Pain: Pain Assessment Pain Assessment: 0-10 Pain Score: 9  Pain Type: Acute pain Pain Location: Hand Pain Orientation: Left Pain Descriptors / Indicators: Aching Pain Onset: With Activity Pain Intervention(s): Repositioned;Rest   See Function Navigator for Current Functional Status.   Therapy/Group: Individual Therapy  Laretta Alstrom 07/17/2015, 11:01 AM

## 2015-07-17 NOTE — Progress Notes (Signed)
[facility] Progress Note  Assessment/Plan: 1. Right tib fx- s/p ORIF  2. ESRD -MWF - HD today; records reviewed from outpt labs: see dialysis orders below 3. Anemia - Hgb 9.8 - Aranesp 60 q Fri started 4/28 - generally hasn't been on ESA - hgb have been stable uppr 11 to 12- dose venofer 100 x 3 due to pre surgical tsat of 22%-  4. Secondary hyperparathyroidism - P controlled; on vit D and phoslo- corrected Ca high ~10.9 - d/c vit D and hold phoslo for now- if P goes up add non Ca binder; previously on Hectorol 8; suspect increase Ca due to alb drop- resume as able - normally on a 2.5 Ca bath 5. HTN/volume - controlled-  6. Nutrition - renal carb mod diet/vits/nepro 7. Increase in LFT AST 77 4/26 - was normal 4/22 - repeat with Monday pre HD labs  Renal Attending: I agree with note as articulated above by Dr. Jerline Pain. Janele Lague C   Subjective:   Doing well this morning. No complaints.   Objective Filed Vitals:   07/16/15 1411 07/16/15 2025 07/16/15 2100 07/17/15 0519  BP: 134/46 106/37 120/48 132/43  Pulse: 51 50 50 56  Temp: 97.7 F (36.5 C)   97.8 F (36.6 C)  TempSrc: Oral   Oral  Resp: 18   17  Height:      Weight:    132 lb 7.9 oz (60.1 kg)  SpO2: 98%   100%   Physical Exam General: alert sitting in chair, NAD Heart: RRR Lungs: CTA Abdomen: soft NT Extremities: right LE in immobilizer- left LE no sig edema Dialysis Access: right upper AVF + bruit  Dialysis Orders:  Center: [place]  and [facility] on MWF . EDW 64.5 HD Bath 3 K 2.5Ca Time 3.5hrs Heparin none. Access RUE AVF BFR 350  DFR 600 Hectorol 8 no Fe no ESA Max UF goal per hour 1300  Recent monthly labs drawn 07/08/15  hgb 12.1 4/22 ferritin 1178 Fe 54 tsat 22% iPTH 767 Ca 9.3 corrected P 5.3, AST 31 and ALT 6 alk phos 328 ; vit D level 31 01/2015  Additional Objective Labs: Basic Metabolic Panel:  Recent Labs Lab 07/12/15 1555 07/13/15 0438 07/14/15 0503  NA  --  138  137  K  --  4.1 4.5  CL  --  95* 93*  CO2  --  30 29  GLUCOSE  --  242* 161*  BUN  --  27* 45*  CREATININE 3.00* 4.03* 6.11*  CALCIUM  --  9.1 9.5  PHOS  --   --  3.1   Liver Function Tests:  Recent Labs Lab 07/13/15 0438 07/14/15 0503  AST 77*  --   ALT 12*  --   ALKPHOS 282*  --   BILITOT 0.7  --   PROT 6.3*  --   ALBUMIN 2.3* 2.2*   CBC:  Recent Labs Lab 07/12/15 1555 07/13/15 0438 07/14/15 0503 07/16/15 0732  WBC 10.1 9.9 13.1* 14.2*  NEUTROABS  --  6.6  --  9.3*  HGB 10.7* 9.6* 10.0* 9.8*  HCT 33.7* 29.7* 30.7* 30.3*  MCV 95.2 96.1 95.6 97.7  PLT 281 292 316 385  CBG:  Recent Labs Lab 07/16/15 1623 07/16/15 1930 07/16/15 2202 07/16/15 2252 07/17/15 0646  GLUCAP 300* 167* 50* 105* 183*   Medications:   . aspirin  325 mg Oral Daily  . [START ON 07/21/2015] darbepoetin (ARANESP) injection - DIALYSIS  60 mcg Intravenous  Q Fri-HD  . diclofenac sodium  2 g Topical TID  . feeding supplement (NEPRO CARB STEADY)  237 mL Oral BID BM  . ferric gluconate (FERRLECIT/NULECIT) IV  125 mg Intravenous Q M,W,F-HD  . glimepiride  1 mg Oral Q breakfast  . heparin  5,000 Units Subcutaneous Q8H  . hydrALAZINE  50 mg Oral BID  . insulin aspart  0-24 Units Subcutaneous TID AC & HS  . isosorbide mononitrate  30 mg Oral Daily  . levothyroxine  75 mcg Oral QAC breakfast  . multivitamin  1 tablet Oral QHS  . nebivolol  10 mg Oral Daily  . pravastatin  20 mg Oral q1800  . vitamin E  400 Units Oral Daily   Caleb M. Jerline Pain, [place] Resident PGY-2 07/17/2015 10:14 AM

## 2015-07-17 NOTE — Progress Notes (Signed)
Occupational Therapy Session Note  Patient Details  Name: [name] MRN: [id number] Date of Birth: 1928-12-20  Today's Date: 07/17/2015 OT Individual Time: 1130-1200 and 1300-1400 OT Individual Time Calculation (min): 30 min and 60 min  Short Term Goals: Week 1:  OT Short Term Goal 1 (Week 1): Pt will complete stand pivot transfer to [facility] with mod A OT Short Term Goal 2 (Week 1): Pt will carry over transfer technique of stand pivot transfer with min cues OT Short Term Goal 3 (Week 1): Pt will don pants using AE PRN with min A OT Short Term Goal 4 (Week 1): Pt will complete toileting task with mod A.  Skilled Therapeutic Interventions/Progress Updates:    Session One: Pt seen for OT session focusing on cognitive re-training. Pt sitting up in w/[name] upon arrival, voicing increased fatigue from previouse ssessions, however, willing to participate in therapy at w/[name] level. She self propelled w/[name] ~45ft to therapy day room for UE strengthening and endurance, however, required frequent rest breaks during task.  In dayroom, pt completed cognitive money management task at table top level. Pt's R LE taken off elevating leg rest in order to improve pt's ability to tolerate LE in non-dependent position. Required to read through weekly shopping ad and count out exact change for commonly used items. She required min questioning cues for correct amount. She required mod-max cuing for solving math problem of giving back correct change. She returned to her room total A at end of session, left sitting up in w/[name] in prep for lunch.   Session Two: Pt seen for OT sessio focusing on mobility and UE strengthening. Pt sitting up in w/[name] upon arrival, agreeable to tx session. In therapy gym, completed sliding board transfer with mod A and verbal and tactile cues for proper technique. Pt voiced increased fatigue following sliding board transfer and requested supine rest break on mat, despite therapist encouragement to try to  stay up pt insisted on laying down. Min A provided for return to supine.  While in supine, pt completed x10 modified bridge raises, x2 sets of 10 bench press and 10 overhead raises with rest breaks btwn sets.  She returned to [facility] and completed sit <> stand with max A. Therapist's foot placed under pt's R LE to ensure NWB. She was able to remove one UE and maintain static balance with mod A in prep for dressing task.  She completed stand pivot transfer using RW to w/[name] with max A and continuous cuing for posture. She required max- total A to position hips into w/[name] as pt attempting to sit prior to getting all the way in to the chair.  Pt returned to room and completed max A stand pivot back to bed. Pt left in supine in prep for dialysis.     Therapy Documentation Precautions:  Precautions Precautions: Fall Precaution Comments: HD MWF Required Braces or Orthoses: Knee Immobilizer - Right Knee Immobilizer - Right: On when out of bed or walking Restrictions Weight Bearing Restrictions: Yes RLE Weight Bearing: Non weight bearing See Function Navigator for Current Functional Status.   Therapy/Group: Individual Therapy  [name], [name] 07/17/2015, 7:24 AM

## 2015-07-18 ENCOUNTER — Inpatient Hospital Stay (HOSPITAL_COMMUNITY): Payer: Medicare Other | Admitting: Occupational Therapy

## 2015-07-18 ENCOUNTER — Inpatient Hospital Stay (HOSPITAL_COMMUNITY): Payer: Medicare Other

## 2015-07-18 LAB — GLUCOSE, CAPILLARY
GLUCOSE-CAPILLARY: 236 mg/dL — AB (ref 65–99)
Glucose-Capillary: 142 mg/dL — ABNORMAL HIGH (ref 65–99)
Glucose-Capillary: 51 mg/dL — ABNORMAL LOW (ref 65–99)
Glucose-Capillary: 63 mg/dL — ABNORMAL LOW (ref 65–99)
Glucose-Capillary: 82 mg/dL (ref 65–99)
Glucose-Capillary: 264 mg/dL — ABNORMAL HIGH (ref 65–99)

## 2015-07-18 NOTE — Progress Notes (Signed)
Nutrition Follow-up  DOCUMENTATION CODES:   Not applicable  INTERVENTION:  Continue Nepro Shake po BID, each supplement provides 425 kcal and 19 grams protein.  Encourage adequate PO intake.   NUTRITION DIAGNOSIS:   Increased nutrient needs related to chronic illness as evidenced by estimated needs; ongoing  GOAL:   Patient will meet greater than or equal to 90% of their needs; met  MONITOR:   PO intake, Supplement acceptance, Weight trends, Labs, I & O's  REASON FOR ASSESSMENT:   Malnutrition Screening Tool    ASSESSMENT:   82 year old right-handed female well known to rehabilitation services for history of right pilon fracture received inpatient rehabilitation services 2 years ago and was discharged home with daughter. Past medical history of end-stage renal disease with hemodialysis, diabetes mellitus, left breast cancer, PAF maintained on aspirin 81 mg, chronic diastolic congestive heart failure. Presented 07/05/2015 [place] after mechanical fall without loss of consciousness. Sustained comminuted fracture right tibia-fibula. Underwent ORIF 07/07/2015  Meal completion has been varied from 20-90% with today's intake at 60-75%. Appetite good. Pt currently has Nepro Shake ordered and has been consuming them. RD to continue with current orders. Labs and medications reviewed.   Diet Order:  Diet renal/carb modified with fluid restriction Diet-HS Snack?: Nothing; Room service appropriate?: Yes; Fluid consistency:: Thin  Skin:   (Incision on R tibia)  Last BM:  5/2  Height:   Ht Readings from Last 1 Encounters:  07/12/15 _0  (1.549 m)    Weight:   Wt Readings from Last 1 Encounters:  07/18/15 134 lb 7.7 oz (61 kg)    Ideal Body Weight:  47.7 kg  BMI:  Body mass index is 25.42 kg/(m^2).  Estimated Nutritional Needs:   Kcal:  1650-1850  Protein:  75-85 grams  Fluid:  1.2 L/day  EDUCATION NEEDS:   No education needs  identified at this time  Corrin Parker, MS, RD, LDN Pager # [phone] After hours/ weekend pager # [phone]

## 2015-07-18 NOTE — Progress Notes (Signed)
Occupational Therapy Session Note  Patient Details  Name: [name] MRN: [id number] Date of Birth: [birth year]  Today's Date: 07/18/2015 OT Individual Time: 1435-1500 OT Individual Time Calculation (min): 25 min    Short Term Goals: Week 1:  OT Short Term Goal 1 (Week 1): Pt will complete stand pivot transfer to [facility] with mod A OT Short Term Goal 2 (Week 1): Pt will carry over transfer technique of stand pivot transfer with min cues OT Short Term Goal 3 (Week 1): Pt will don pants using AE PRN with min A OT Short Term Goal 4 (Week 1): Pt will complete toileting task with mod A.  Skilled Therapeutic Interventions/Progress Updates:    Pt seen for OT tx session focusing on sit <> stand and functional standing balance. Pt sitting up in w/c upon arrival with family members present, agreeable to tx session. Pt voiced increased pain in hands and back this session, RN made aware and medication provided.  She self propelled w/c ~25 ft for UE strengthening and therapist provided assist remainder of way to therapy gym for time and energy conservation. In gym, pt completed 3x sit <> stand from w/c to RW. Mod-max A required to stand and min- total A required for static standing balance. During standing trials, pt required to remove clothes pins from clothes pin tree focusing on maintaining balance without UE support in prep for ADL tasks. She required max consistent cuing for hand placement and sequencing of task. She tolerated ~30 seconds of static standing before requesting seated rest break.  She self propelled w/c ~75 ft back to room at end of session. Voiced increased fatigue and discomfort in shoulders when propelling. Light massage provided to shoulder.  Pt returned to room at end of session, left with all needs in reach.   Therapy Documentation Precautions:  Precautions Precautions: Fall Precaution Comments: HD MWF Required Braces or Orthoses: Knee Immobilizer - Right Knee Immobilizer -  Right: On when out of bed or walking Restrictions Weight Bearing Restrictions: Yes RLE Weight Bearing: Non weight bearing Pain: Pain Assessment Pain Assessment: 0-10 Pain Score: 8  Pain Type: Acute pain Pain Location: Hand Pain Orientation: Left Pain Descriptors / Indicators: Aching Pain Onset: With Activity Pain Intervention(s): Medication (See eMAR), RN made aware, repositioned, increased activity  See Function Navigator for Current Functional Status.   Therapy/Group: Individual Therapy  Lewis, Trevell Pariseau C 07/18/2015, 3:07 PM

## 2015-07-18 NOTE — Progress Notes (Signed)
Physical Therapy Note  Patient Details  Name: [name] MRN: [id number] Date of Birth: 1928-12-17 Today's Date: 07/18/2015    Attempted to see pt for 20 minutes missed time earlier in the day due to late lunch tray. Pt politely declined stating that she had just finished a session and was very tired. Will revisit as schedule allows; continue POC.   Benjiman Core Tygielski 07/18/2015, 3:13 PM

## 2015-07-18 NOTE — Progress Notes (Signed)
[place] PHYSICAL MEDICINE & REHABILITATION     PROGRESS NOTE    Subjective/Complaints: Hands feeling better. Had a good night. Appetite good    ROS: Pt denies fever, rash/itching, headache, blurred or double vision, nausea, vomiting, abdominal pain, diarrhea, chest pain, shortness of breath, palpitations, dysuria, dizziness, neck or back pain, bleeding, anxiety, or depression  Objective: Vital Signs: Blood pressure 121/25, pulse 54, temperature 98.3 F (36.8 C), temperature source Oral, resp. rate 14, height 5\' 1"  (1.549 m), weight 61 kg (134 lb 7.7 oz), SpO2 100 %. No results found.  Recent Labs  07/16/15 0732 07/17/15 1500  WBC 14.2* 12.5*  HGB 9.8* 9.5*  HCT 30.3* 29.0*  PLT 385 391    Recent Labs  07/17/15 1500  NA 135  K 4.1  CL 94*  GLUCOSE 222*  BUN 52*  CREATININE 7.68*  CALCIUM 9.3   CBG (last 3)   Recent Labs  07/17/15 1711 07/17/15 2045 07/18/15 0644  GLUCAP 130* 266* 142*    Wt Readings from Last 3 Encounters:  07/18/15 61 kg (134 lb 7.7 oz)  07/11/15 66.225 kg (146 lb)  01/06/14 66.996 kg (147 lb 11.2 oz)    Physical Exam:  Constitutional: She is oriented to person, place, and time. She appears well-developed and well-nourished.  HENT:  Head: Normocephalic and atraumatic.  Eyes: Conjunctivae and EOM are normal.  Neck: Normal range of motion. Neck supple. No thyromegaly present.  Cardiovascular:  Irregular irregular RUE fistula  Respiratory: Effort normal and breath sounds normal. No respiratory distress.  GI: Soft. Bowel sounds are normal. She exhibits no distension.  Musculoskeletal: She exhibits edema and tenderness. Both first cmc's tender Neurological: She is alert and oriented to person, place, and time.  A&Ox2 DTRs symmetric Sensation intact to light touch Motor: B/l UE 4/5 proximal to distal LLE: 3- proximally, 4/5 distally RLE: hip flexion 2/5, wiggles toes  Skin:  Right leg wounds clean/dry with staples.  Leg still  slightly swollen, bruised sm amt dried blood on telfa Psychiatric: She has a normal mood and affect. Her behavior is normal.     Assessment/Plan: 1. Functional deficits secondary to right tib/fib fx which require 3+ hours per day of interdisciplinary therapy in a comprehensive inpatient rehab setting. Physiatrist is providing close team supervision and 24 hour management of active medical problems listed below. Physiatrist and rehab team continue to assess barriers to discharge/monitor patient progress toward functional and medical goals.  Function:  Bathing Bathing position   Position: Sitting EOB  Bathing parts Body parts bathed by patient: Right arm, Left arm, Chest, Abdomen, Front perineal area, Buttocks, Right upper leg, Left upper leg Body parts bathed by helper: Right upper leg, Left upper leg, Right lower leg, Left lower leg, Back  Bathing assist Assist Level:  (patient completed 70%, moderate assist)      Upper Body Dressing/Undressing Upper body dressing   What is the patient wearing?: Pull over shirt/dress, Bra Bra - Perfomed by patient: Thread/unthread right bra strap, Thread/unthread left bra strap Bra - Perfomed by helper: Thread/unthread right bra strap, Thread/unthread left bra strap, Hook/unhook bra (pull down sports bra) (required assist due pain in hands) Pull over shirt/dress - Perfomed by patient: Thread/unthread right sleeve, Thread/unthread left sleeve, Put head through opening, Pull shirt over trunk Pull over shirt/dress - Perfomed by helper: Pull shirt over trunk        Upper body assist Assist Level: Touching or steadying assistance(Pt > 75%)      Lower  Body Dressing/Undressing Lower body dressing   What is the patient wearing?: Underwear, Pants Underwear - Performed by patient: Pull underwear up/down (in supine) Underwear - Performed by helper: Thread/unthread left underwear leg, Thread/unthread right underwear leg Pants- Performed by patient:  Thread/unthread left pants leg Pants- Performed by helper: Thread/unthread right pants leg, Pull pants up/down       Socks - Performed by helper: Don/doff left sock Shoes - Performed by patient: Don/doff left shoe Shoes - Performed by helper: Fasten left, Don/doff right shoe          Lower body assist Assist for lower body dressing:  (patient completed 20%, total assist)      Toileting Toileting   Toileting steps completed by patient: Performs perineal hygiene, Adjust clothing after toileting Toileting steps completed by helper: Adjust clothing prior to toileting (per Courtney Paris, NT) Toileting Assistive Devices: Grab bar or rail  Toileting assist Assist level: Two helpers (per [employer], NT)   Transfers Chair/bed transfer   Chair/bed transfer method: Lateral scoot Chair/bed transfer assist level: Touching or steadying assistance (Pt > 75%) Chair/bed transfer assistive device: Sliding board, Armrests     Locomotion Ambulation Ambulation activity did not occur: Safety/medical concerns (unsafe on eval)         Wheelchair   Type: Manual Max wheelchair distance: 180 Assist Level: Touching or steadying assistance (Pt > 75%)  Cognition Comprehension Comprehension assist level: Understands basic 75 - 89% of the time/ requires cueing 10 - 24% of the time  Expression Expression assist level: Expresses basic 90% of the time/requires cueing < 10% of the time.  Social Interaction Social Interaction assist level: Interacts appropriately with others with medication or extra time (anti-anxiety, antidepressant).  Problem Solving Problem solving assist level: Solves basic 50 - 74% of the time/requires cueing 25 - 49% of the time  Memory Memory assist level: Recognizes or recalls 50 - 74% of the time/requires cueing 25 - 49% of the time    Medical Problem List and Plan: 1. Limited functional mobility secondary to comminuted right tibia-fibula fracture. Status post ORIF 07/07/2015.  Nonweightbearing. Knee immobilizer when out of bed. No ROM precautions  -continue CIR therapies---team conf today 2. DVT Prophylaxis/Anticoagulation: Subcutaneous heparin. Monitor platelet counts and any signs of bleeding 3. Pain Management: Oxycodone 5-10 mg every 4 hours as needed  -added voltaren gel for bilateral hand OA 4. Acute blood loss anemia. hgb 9.6. Fe++ supp per nephrology 5. Neuropsych: This patient is capable of making decisions on her own behalf.She is oriented to person place and month, not taking much in terms of pain med per RN, may benefit from neuropsych eval 6. Skin/Wound Care: can change right leg dressing as needed  -will remove staples over next 2-3 days 7. Fluids/Electrolytes/Nutrition: Routine I&O's with follow-up chemistries reviewed. Taking 40-75% meals 8. End-stage renal disease with hemodialysis. HD each day after therapy day. 9. PAF. Aspirin 325 mg daily. Cardiac rate control 10. Hypertension. Hydralazine 50 mg twice a day, Imdur 30 mg daily 11. Diastolic congestive heart failure. Monitor for any signs of fluid overload 12. Diabetes mellitus. Checking blood sugars before meals and at bedtime. Sliding scale insulin.   -sugars higher in PM--had reactive hypoglycemia after rx of sugar  -added am amaryl 1mg . Titrate further if needed. Improved cbg's yesterday CBG (last 3)   Recent Labs  07/17/15 1711 07/17/15 2045 07/18/15 0644  GLUCAP 130* 266* 142*   13. Hypothyroidism. Synthroid 75 g daily 14. Hyperlipidemia. Pravachol 15.  Leukocytosis but  afebrile, no c/o except RLE pain, will monitor , incisions look ok.  -ua/cx not done---re ordered  CBC Latest Ref Rng 07/17/2015 07/16/2015 07/14/2015  WBC 4.0 - 10.5 K/uL 12.5(H) 14.2(H) 13.1(H)  Hemoglobin 12.0 - 15.0 g/dL 9.5(L) 9.8(L) 10.0(L)  Hematocrit 36.0 - 46.0 % 29.0(L) 30.3(L) 30.7(L)  Platelets 150 - 400 K/uL 391 385 316     LOS (Days) 6 A FACE TO FACE EVALUATION WAS PERFORMED  SWARTZ,ZACHARY  T 07/18/2015 8:37 AM

## 2015-07-18 NOTE — Progress Notes (Signed)
Occupational Therapy Session Note  Patient Details  Name: [name] MRN: [id number] Date of Birth: [birth year]  Today's Date: 07/18/2015 OT Individual Time: 1000-1045 OT Individual Time Calculation (min): 45 min    Short Term Goals: Week 1:  OT Short Term Goal 1 (Week 1): Pt will complete stand pivot transfer to [facility] with mod A OT Short Term Goal 2 (Week 1): Pt will carry over transfer technique of stand pivot transfer with min cues OT Short Term Goal 3 (Week 1): Pt will don pants using AE PRN with min A OT Short Term Goal 4 (Week 1): Pt will complete toileting task with mod A.  Skilled Therapeutic Interventions/Progress Updates:    1:1 Focus on transfer training. Pt perform stand pivot transfers with min to mod A from w/c <>mat. Continued focus on using a reacher to thread right LE through simulated leg hole. Pt able to thread both LEs using reacher after instructional cues. Pt able to self propel w/c with extra time for continued UB strengthening and activity tolerance. Pt returned to bed with min a stand pivot transfer. Pt still required min A for management of right Le into bed to get into supine position.   Therapy Documentation Precautions:  Precautions Precautions: Fall Precaution Comments: HD MWF Required Braces or Orthoses: Knee Immobilizer - Right Knee Immobilizer - Right: On when out of bed or walking Restrictions Weight Bearing Restrictions: Yes RLE Weight Bearing: Non weight bearing General: General OT Amount of Missed Time: 20 Minutes due to fatigue despite attempts to work through it Pain:  reports soreness in LE but did complain of increase fatigue today   See Function Navigator for Current Functional Status.   Therapy/Group: Individual Therapy  [name] [facility] 07/18/2015, 10:54 AM

## 2015-07-18 NOTE — Progress Notes (Signed)
Occupational Therapy Session Note  Patient Details  Name: [name] MRN: [id number] Date of Birth: September 11, 1928  Today's Date: 07/18/2015 OT Individual Time: 8:30 - 9:50  OT Individual Time Calculation (min): 80 min    Short Term Goals: Week 1:  OT Short Term Goal 1 (Week 1): Pt will complete stand pivot transfer to [facility] with mod A OT Short Term Goal 2 (Week 1): Pt will carry over transfer technique of stand pivot transfer with min cues OT Short Term Goal 3 (Week 1): Pt will don pants using AE PRN with min A OT Short Term Goal 4 (Week 1): Pt will complete toileting task with mod A.  Skilled Therapeutic Interventions/Progress Updates:    Pt seen for skilled OT to facilitate activity tolerance and standing balance with ADL retraining. Pt stated she was very fatigued and moved quite slowly during first half of the session needing numerous rest breaks.  She bathed LB bed level and needed A to don pants over R leg, but was able to do well with pants/socks/ shoes on L as she has good LE flexibility.  Therapist donned KI.  Sat to EOB to complete UB self care. Pt tolerated her RLE in a more dependent position. Pt needed to toilet. Bed height elevated and pt able to stand to RW with min A to lift up. She then pivoted on L foot to BSC and min to mod A to lower down smoothly. Pt did well with wt shifts to cleanse self but does need A with clothing management as she is focusing on holding balance with RW. Repeated this transfer to the w/c.  Pt in room with all needs met.  Therapy Documentation Precautions:  Precautions Precautions: Fall Precaution Comments: HD MWF Required Braces or Orthoses: Knee Immobilizer - Right Knee Immobilizer - Right: On when out of bed or walking Restrictions Weight Bearing Restrictions: Yes RLE Weight Bearing: Non weight bearing   Vital Signs: Therapy Vitals Pulse Rate: (!) 49 BP: (!) 151/44 mmHg Patient Position (if appropriate): Sitting Pain: no c/o pain    ADL:   See Function Navigator for Current Functional Status.   Therapy/Group: Individual Therapy  [place] 07/18/2015, 10:57 AM

## 2015-07-18 NOTE — Progress Notes (Addendum)
Social Work  Social Work Assessment and Plan  Patient Details  Name: [name] MRN: [id number] Date of Birth: 1928/05/24  Today's Date: 07/14/2015  Problem List:  Patient Active Problem List   Diagnosis Date Noted  . Hip fracture ([place]) 07/12/2015  . Swelling   . Post-operative pain   . Acute blood loss anemia   . ESRD on dialysis ([place])   . PAF (paroxysmal atrial fibrillation) ([place])   . Benign essential HTN   . Chronic diastolic congestive heart failure ([place])   . Diabetes mellitus type 2 in nonobese (HCC)   . HLD (hyperlipidemia)   . Thyroid activity decreased   . Elevated brain natriuretic peptide (BNP) level 01/06/2014  . Chest pain, atypical 08/13/2012  . Long term (current) use of anticoagulants 07/30/2012  . Chronic combined systolic and diastolic heart failure EF 45-50% by echo 06/25/2012 07/30/2012  . Breast cancer, s/p Lt mastectomy 07/22/2012  . Chronic anticoagulation 07/22/2012  . Acute on chronic systolic and diastolic heart failure, NYHA class 4 ([place]) 07/20/2012  . Pleural effusion due to CHF (congestive heart failure) ([place]) 07/20/2012  . Atrial fibrillation, spont convers NSR 06/26/12 06/25/2012  . Hypotension, unspecified 06/25/2012  . Diastolic CHF, chronic ([place]) 06/25/2012  . Right tibial fracture 06/21/2012  . Metabolic bone disease [id number]  . Secondary hyperparathyroidism of renal origin ([place]) 06/19/2012  . Nonunion of fracture, R tibia 06/17/2012  . H/O radioactive iodine thyroid ablation   . Closed fracture of right fibula and tibia 07/10/2011  . ESRD (end stage renal disease) ([place]) 07/10/2011  . HTN (hypertension) 07/10/2011  . DM2 (diabetes mellitus, type 2) ([place]) 07/10/2011  . Hyperlipidemia 07/10/2011  . Anemia 07/10/2011   Past Medical History:  Past Medical History  Diagnosis Date  . CHF (congestive heart failure) ([place])   . DM2 (diabetes mellitus, type 2) ([place])   . ESRD (end stage renal disease) ([place])     HD MWF - [facility]  . HTN  (hypertension)   . Hyperlipidemia   . Tibia/fibula fracture 07/10/2011    right - nonunion repair 06/2012  . Hypothyroid   . Breast cancer ([place])   . Arthritis   . Hyperparathyroidism, secondary renal ([place])   . Shortness of breath   . Atrial fibrillation ([place]) dx 06/2012    on coumadin  . Atrial fibrillation ([place]) 06/25/2012    2D Echo - EF 45-50, left atrium severely dilated, mitral valve heavily calcified annulus with calcification of the subvalvular apparatus   Past Surgical History:  Past Surgical History  Procedure Laterality Date  . Mastectomy    . Abdominal surgery    . Thyroid surgery    . Breast surgery Left     Mastectomy  . Eye surgery Bilateral     Cataract  . Ankle fusion Right   . Arteriovenous graft placement Right   . Orif tibia fracture Right 06/16/2012    Procedure: OPEN REDUCTION INTERNAL FIXATION (ORIF) TIBIA FRACTURE;  Surgeon: Rozanna Box, MD;  Location: [facility];  Service: Orthopedics;  Laterality: Right;   Social History:  reports that she has never smoked. Her smokeless tobacco use includes Snuff. She reports that she does not drink alcohol or use illicit drugs.  Family / Support Systems Marital Status: Widow/Widower How Long?: 4 yrs Patient Roles: Parent Children: daughter, Reginia Forts") [place] @ (H) [phone] or (W) [phone] or (C) [phone];  and a son living in [place] Anticipated Caregiver: Daughter and family Ability/Limitations of Caregiver: Daughter works Medical laboratory scientific officer days  as a case Freight forwarder at [employer].  Dtr will have someone stay with patient after discharge Caregiver Availability: 24/7 Family Dynamics: Daughter very supportive and willing to provide pt with any support needed.  Social History Preferred language: English Religion: [employer] Cultural Background: NA Read: Yes Write: Yes Employment Status: Retired Date Retired/Disabled/Unemployed: "Presenter, broadcasting Issues:  None Guardian/Conservator: None - per MD, pt is capable of making decisions on her own behalf.   Abuse/Neglect Physical Abuse: Denies Verbal Abuse: Denies Sexual Abuse: Denies Exploitation of patient/patient's resources: Denies Self-Neglect: Denies  Emotional Status Pt's affect, behavior adn adjustment status: Pt able to complete basic assessment, however, does not offer much more than bried answers and appears somewhat disengaged until I tell her I am finished and she brightens and smiles as she says "OK great!". Pt does admit that she is frustrated by yet another fall/ fx and not being able to d/c home to [place].  Will refer for neuropsychology per tx team request. Recent Psychosocial Issues: Fall in 2014 with tib/fib fx and stayed with daughter following this as well. Pyschiatric History: None Substance Abuse History: None  Patient / Family Perceptions, Expectations & Goals Pt/Family understanding of illness & functional limitations: Pt with basic understanding of her injury, surgery and WB precautions. Daughter with very good understanding of medical issues. Premorbid pt/family roles/activities: Pt living alone in [place] and completely independent. Anticipated changes in roles/activities/participation: Pt will need assistance initially upon d/c and daughter to arrange this at her home. Pt/family expectations/goals: "I just want to get home."  [employer]: None Premorbid Home Care/DME Agencies: Other (Comment) [facility]) Transportation available at discharge: yes Resource referrals recommended: Neuropsychology  Discharge Planning Living Arrangements: Alone Support Systems: Children, Friends/neighbors Type of Residence: Private residence Insurance Resources: [employer] Financial Resources: [place] Referred: No Living Expenses: Own Money Management: Patient Does the patient have any problems obtaining your medications?: No Home  Management: pt Patient/Family Preliminary Plans: Pt to initially d/c to daughter's home in [place] until they feel she is ready to return to her own home. Social Work Anticipated Follow Up Needs: HH/OP Expected length of stay: 14 days  Clinical Impression Pleasant, elderly woman here after a fall and tib/fib fx at her home with [place].  Here at [facility] as daughter lives locally and works as Tourist information centre manager at [employer].  Plan for pt to d/c home with her until she is functionally independent enough to return to [place].  Able to complete assessment interview without much difficulty, however, tx team note some cognitive deficits.  Daughter feels they are related to pain meds.  Daughter prepared to arrange 24/7 assistance for her mother.  Will follow for support and d/c planning needs.  [name] 07/14/2015, 10:15 AM

## 2015-07-18 NOTE — Progress Notes (Signed)
[facility] Progress Note  Assessment/Plan: 1. Right tib fx- s/p ORIF  2. ESRD - HD MWF - ; records reviewed from outpt labs: see dialysis orders below 3. Anemia - Hgb 9.8 - Aranesp 60 q Fri started 4/28 - generally hasn't been on ESA - hgb have been stable 9-11- dose venofer 100 x 3 due to pre surgical tsat of 22%-  4. Secondary hyperparathyroidism - P controlled; on vit D and phoslo- corrected Ca high ~10.9 - d/c vit D and hold phoslo for now- if P goes up add non Ca binder; previously on Hectorol 8; suspect increase Ca due to alb drop- resume as able - normally on a 2.5 Ca bath 5. HTN/volume - controlled  6. Nutrition - renal carb mod diet/vits/nepro 7. Increase in LFT AST 77 4/26 - was normal 4/22 - repeat with HD labs  [facility]. Jerline Pain, [place] Resident PGY-2 07/18/2015 7:34 AM   Pt seen, examined and agree w A/P as above.  Kelly Splinter MD [facility] pager [phone]    cell [phone] 07/18/2015, 3:31 PM    Subjective:   Doing well this morning. No complaints.   Objective Filed Vitals:   07/17/15 1841 07/17/15 1940 07/17/15 2043 07/18/15 0536  BP: 114/38 102/23 122/31 121/25  Pulse: 57 55 56 54  Temp:  97.7 F (36.5 C)  98.3 F (36.8 C)  TempSrc:  Oral  Oral  Resp:  15  14  Height:      Weight:  134 lb 11.2 oz (61.1 kg)  134 lb 7.7 oz (61 kg)  SpO2:  100%  100%   Physical Exam General: alert sitting in chair, NAD Heart: RRR Lungs: CTA Abdomen: soft NT Extremities: right LE in immobilizer- left LE no sig edema Dialysis Access: right upper AVF + bruit  Dialysis Orders:  Center: [facility]  and [facility] on MWF . EDW 64.5 HD Bath 3 K 2.5Ca Time 3.5hrs Heparin none. Access RUE AVF BFR 350  DFR 600 Hectorol 8 no Fe no ESA Max UF goal per hour 1300  Recent monthly labs drawn 07/08/15  hgb 12.1 4/22 ferritin 1178 Fe 54 tsat 22% iPTH 767 Ca 9.3 corrected P 5.3, AST 31 and ALT 6 alk phos 328 ; vit D level 31  01/2015  Additional Objective Labs: Basic Metabolic Panel:  Recent Labs Lab 07/13/15 0438 07/14/15 0503 07/17/15 1500  NA 138 137 135  K 4.1 4.5 4.1  CL 95* 93* 94*  CO2 '30 29 25  '$ GLUCOSE 242* 161* 222*  BUN 27* 45* 52*  CREATININE 4.03* 6.11* 7.68*  CALCIUM 9.1 9.5 9.3  PHOS  --  3.1 3.9  3.9   Liver Function Tests:  Recent Labs Lab 07/13/15 0438 07/14/15 0503 07/17/15 1500  AST 77*  --   --   ALT 12*  --   --   ALKPHOS 282*  --   --   BILITOT 0.7  --   --   PROT 6.3*  --   --   ALBUMIN 2.3* 2.2* 2.3*   CBC:  Recent Labs Lab 07/12/15 1555 07/13/15 0438 07/14/15 0503 07/16/15 0732 07/17/15 1500  WBC 10.1 9.9 13.1* 14.2* 12.5*  NEUTROABS  --  6.6  --  9.3*  --   HGB 10.7* 9.6* 10.0* 9.8* 9.5*  HCT 33.7* 29.7* 30.7* 30.3* 29.0*  MCV 95.2 96.1 95.6 97.7 95.1  PLT 281 292 316 385 391  CBG:  Recent Labs Lab  07/17/15 0646 07/17/15 1207 07/17/15 1711 07/17/15 2045 07/18/15 0644  GLUCAP 183* 142* 130* 266* 142*   Medications:   . aspirin  325 mg Oral Daily  . [START ON 07/21/2015] darbepoetin (ARANESP) injection - DIALYSIS  60 mcg Intravenous Q Fri-HD  . diclofenac sodium  2 g Topical TID  . feeding supplement (NEPRO CARB STEADY)  237 mL Oral BID BM  . ferric gluconate (FERRLECIT/NULECIT) IV  125 mg Intravenous Q M,W,F-HD  . glimepiride  1 mg Oral Q breakfast  . heparin  5,000 Units Subcutaneous Q8H  . hydrALAZINE  50 mg Oral BID  . insulin aspart  0-24 Units Subcutaneous TID AC & HS  . isosorbide mononitrate  30 mg Oral Daily  . levothyroxine  75 mcg Oral QAC breakfast  . multivitamin  1 tablet Oral QHS  . nebivolol  10 mg Oral Daily  . pravastatin  20 mg Oral q1800  . vitamin E  400 Units Oral Daily

## 2015-07-18 NOTE — Progress Notes (Signed)
Physical Therapy Session Note  Patient Details  Name: [name] MRN: [id number] Date of Birth: December 11, 1928  Today's Date: 07/18/2015 PT Individual Time: 1320-1400 PT Individual Time Calculation (min): 40 min  and Today's Date: 07/18/2015 PT Missed Time: 20 Minutes Missed Time Reason: Unavailable (Comment) (Pt requesting time to eat lunch due to late tray)  Short Term Goals: Week 1:  PT Short Term Goal 1 (Week 1): Pt will be able to perform basic transfers with min assist PT Short Term Goal 2 (Week 1): Pt will be able to perform sit <> stands with min assist PT Short Term Goal 3 (Week 1): Pt will be able to propel w/c x 150' with supervision PT Short Term Goal 4 (Week 1): Pt will be able to initiate short distance gait  Skilled Therapeutic Interventions/Progress Updates:   Session started late due to pt requesting time to eat lunch due to late tray. Focused on functional bed mobility with pt managing RLE herself today with overall supervision and use of rails for support. Worked towards tolerating RLE in more dependent position while seated EOB. Addressed sit <> stands from EOB using RW for support with mod to max assist overall due to difficulty controlling descent or transition onto RW x 3 repetitions and progressing towards attempting to take a hop with minimal success (pt pivoting on foot more than hopping). Performed mod assist squat pivot from bed to w/c with cues for sequencing and hand placement. W/c propulsion through obstacle course with supervision and extra time. Set up with all needs in reach and visitor entering room.   Therapy Documentation Precautions:  Precautions Precautions: Fall Precaution Comments: HD MWF Required Braces or Orthoses: Knee Immobilizer - Right Knee Immobilizer - Right: On when out of bed or walking Restrictions Weight Bearing Restrictions: Yes RLE Weight Bearing: Non weight bearing General: PT Amount of Missed Time (min): 20 Minutes PT Missed  Treatment Reason: Unavailable (Comment) (Pt requesting time to eat lunch due to late tray)  Pain: Reports pain is better today and able to tolerate leg in dependent position for short periods of time.    See Function Navigator for Current Functional Status.   Therapy/Group: Individual Therapy  Canary Brim Ivory Broad, PT, DPT  07/18/2015, 2:17 PM

## 2015-07-19 ENCOUNTER — Inpatient Hospital Stay (HOSPITAL_COMMUNITY): Payer: Medicare Other | Admitting: Occupational Therapy

## 2015-07-19 ENCOUNTER — Inpatient Hospital Stay (HOSPITAL_COMMUNITY): Payer: Medicare Other

## 2015-07-19 DIAGNOSIS — R413 Other amnesia: Secondary | ICD-10-CM | POA: Insufficient documentation

## 2015-07-19 LAB — RENAL FUNCTION PANEL
ALBUMIN: 2.4 g/dL — AB (ref 3.5–5.0)
Anion gap: 15 (ref 5–15)
BUN: 33 mg/dL — AB (ref 6–20)
CALCIUM: 9.2 mg/dL (ref 8.9–10.3)
CO2: 26 mmol/L (ref 22–32)
CREATININE: 6.56 mg/dL — AB (ref 0.44–1.00)
Chloride: 95 mmol/L — ABNORMAL LOW (ref 101–111)
GFR, EST AFRICAN AMERICAN: 6 mL/min — AB (ref >60)
GFR, EST NON AFRICAN AMERICAN: 5 mL/min — AB (ref >60)
Glucose, Bld: 75 mg/dL (ref 65–99)
PHOSPHORUS: 4.2 mg/dL (ref 2.5–4.6)
Potassium: 3.7 mmol/L (ref 3.5–5.1)
Sodium: 136 mmol/L (ref 135–145)

## 2015-07-19 LAB — CBC
HCT: 29.6 % — ABNORMAL LOW (ref 36.0–46.0)
Hemoglobin: 9.9 g/dL — ABNORMAL LOW (ref 12.0–15.0)
MCH: 31.7 pg (ref 26.0–34.0)
MCHC: 33.4 g/dL (ref 30.0–36.0)
MCV: 94.9 fL (ref 78.0–100.0)
PLATELETS: 394 10*3/uL (ref 150–400)
RBC: 3.12 MIL/uL — AB (ref 3.87–5.11)
RDW: 15.9 % — AB (ref 11.5–15.5)
WBC: 12.9 10*3/uL — AB (ref 4.0–10.5)

## 2015-07-19 LAB — GLUCOSE, CAPILLARY
GLUCOSE-CAPILLARY: 103 mg/dL — AB (ref 65–99)
GLUCOSE-CAPILLARY: 67 mg/dL (ref 65–99)
GLUCOSE-CAPILLARY: 117 mg/dL — AB (ref 65–99)
GLUCOSE-CAPILLARY: 166 mg/dL — AB (ref 65–99)
Glucose-Capillary: 371 mg/dL — ABNORMAL HIGH (ref 65–99)

## 2015-07-19 MED ORDER — HEPARIN SODIUM (PORCINE) 1000 UNIT/ML DIALYSIS
1000.0000 [IU] | INTRAMUSCULAR | Status: DC | PRN
  Filled 2015-07-19: qty 1

## 2015-07-19 MED ORDER — SODIUM CHLORIDE 0.9 % IV SOLN: 100.0000 mL | INTRAVENOUS | Status: DC | PRN

## 2015-07-19 MED ORDER — HEPARIN SODIUM (PORCINE) 1000 UNIT/ML DIALYSIS
2000.0000 [IU] | INTRAMUSCULAR | Status: DC | PRN
  Filled 2015-07-19: qty 2

## 2015-07-19 MED ORDER — LIDOCAINE-PRILOCAINE 2.5-2.5 % EX CREA
1.0000 "application " | TOPICAL_CREAM | CUTANEOUS | Status: DC | PRN
  Filled 2015-07-19: qty 5

## 2015-07-19 MED ORDER — INSULIN ASPART 100 UNIT/ML ~~LOC~~ SOLN
0.0000 [IU] | Freq: Three times a day (TID) | SUBCUTANEOUS | Status: DC
  Administered 2015-07-19: 10 [IU] via SUBCUTANEOUS
  Administered 2015-07-20 (×2): 4 [IU] via SUBCUTANEOUS
  Administered 2015-07-21 (×2): 6 [IU] via SUBCUTANEOUS
  Administered 2015-07-22 – 2015-07-24 (×3): 4 [IU] via SUBCUTANEOUS

## 2015-07-19 MED ORDER — PENTAFLUOROPROP-TETRAFLUOROETH EX AERO: 1.0000 "application " | INHALATION_SPRAY | CUTANEOUS | Status: DC | PRN

## 2015-07-19 MED ORDER — ALTEPLASE 2 MG IJ SOLR
2.0000 mg | Freq: Once | INTRAMUSCULAR | Status: DC | PRN
  Filled 2015-07-19: qty 2

## 2015-07-19 MED ORDER — LIDOCAINE HCL (PF) 1 % IJ SOLN: 5.0000 mL | INTRAMUSCULAR | Status: DC | PRN

## 2015-07-19 NOTE — Progress Notes (Signed)
Family has requested discontinuation of oxycodone and to use only Tylenol for pain. They feel the oxycodone is causing some mild confusion. Discussed with family possibly trying tramadol again they only requested Tylenol.

## 2015-07-19 NOTE — Progress Notes (Signed)
Occupational Therapy Session Note  Patient Details  Name: [name] MRN: [id number] Date of Birth: 01/20/1929  Today's Date: 07/19/2015 OT Individual Time: 1300-1400 OT Individual Time Calculation (min): 60 min    Short Term Goals: Week 1:  OT Short Term Goal 1 (Week 1): Pt will complete stand pivot transfer to [facility] with mod A OT Short Term Goal 2 (Week 1): Pt will carry over transfer technique of stand pivot transfer with min cues OT Short Term Goal 3 (Week 1): Pt will don pants using AE PRN with min A OT Short Term Goal 4 (Week 1): Pt will complete toileting task with mod A.  Skilled Therapeutic Interventions/Progress Updates:    1:1 Focus on functional transfers, sit to stands, stand pivot transfers, short distance ambulation with RW to access bathroom, toileting in standing with RW, etc. Pt able to come to EOB with min A for management of right LE. Pt perform stand pivot transfer with min A into w/c on her left. Pt able to ambulate into bathroom to the toilet with 5-7 hops with RW with min A and VC for weight bearing precautions. Pt performed sit to stand from toilet with steadying A with RW. Pt perform toileting clothing management with min A for balance while both hands perform clothing management. Pt returned to w/c outside bathroom door with extra time and min A. Performed w/c obstacle course with focus maneuvering w/c around narrow passageways and obstacles. Return to room and into bed with min A and extra time.    Therapy Documentation Precautions:  Precautions Precautions: Fall Precaution Comments: HD MWF Required Braces or Orthoses: Knee Immobilizer - Right Knee Immobilizer - Right: On when out of bed or walking Restrictions Weight Bearing Restrictions: Yes RLE Weight Bearing: Non weight bearing Pain: Pain Assessment Pain Assessment: No/denies pain  See Function Navigator for Current Functional Status.   Therapy/Group: Individual Therapy  [name]  [facility] 07/19/2015, 4:33 PM

## 2015-07-19 NOTE — Significant Event (Signed)
Hypoglycemic Event  CBG: 67  Treatment: 15 GM carbohydrate snack  Symptoms: None  Follow-up CBG: Time:0713 CBG Result:103  Possible Reasons for Event: medication Comments/MD notified:Insulin scale adjusted-     [name]  Silverio [name]

## 2015-07-19 NOTE — Progress Notes (Signed)
Patient arrived to unit per bed.  Reviewed treatment plan and this RN agrees.  Report received from bedside RN, Barnett Applebaum.  Consent verified.  Patient A & o X 4, forgetful at times. Lung sounds diminished to ausculation in all fields. Generalized edema. Cardiac: Sinus brady.  Prepped RUAVF with alcohol and cannulated with two 15 gauge needles.  Pulsation of blood noted.  Flushed access well with saline per protocol.  Connected and secured lines and initiated tx at 1447.  UF goal of 500 mL and net fluid removal of 0 mL.  Will continue to monitor.

## 2015-07-19 NOTE — Progress Notes (Signed)
Physical Therapy Session Note  Patient Details  Name: [name] MRN: [id number] Date of Birth: Jan 06, 1929  Today's Date: 07/19/2015 PT Individual Time: 1105-1135 PT Individual Time Calculation (min): 30 min   Short Term Goals: Week 1:  PT Short Term Goal 1 (Week 1): Pt will be able to perform basic transfers with min assist PT Short Term Goal 2 (Week 1): Pt will be able to perform sit <> stands with min assist PT Short Term Goal 3 (Week 1): Pt will be able to propel w/c x 150' with supervision PT Short Term Goal 4 (Week 1): Pt will be able to initiate short distance gait  Skilled Therapeutic Interventions/Progress Updates:   Pt up in recliner and requesting to return back to bed to rest due to fatigue. Attempted squat/stand pivot from recliner but pt with increasing pain in RLE when placed in dependent position, so ultimately decided on use of slideboard transfer so PT could support RLE more during transfer. Pt able to perform this with min assist during transfer and set-up assist for placing and removing slideboard. Pt returned to supine and repositioned with min assist to support RLE.  Instructed in RLE strengthening and ROM exercises including ankle pumps x 20 reps, quad sets x 20 reps, active assisted abduction/adduction x 2 sets of 10 reps, and active assisted heel slides (very limited knee flexion tolerated) x 10 reps.   Pt asking why her knee/shin area hurt so much - reminded her of the need to have surgery after her fall with pt response of "oh yeah." Pt disoriented but pleasant throughout session. Thought this therapist saw her earlier this morning but changed clothing.   Left with NT in room and bed alarm on.   Therapy Documentation Precautions:  Precautions Precautions: Fall Precaution Comments: HD MWF Required Braces or Orthoses: Knee Immobilizer - Right Knee Immobilizer - Right: On when out of bed or walking Restrictions Weight Bearing Restrictions: Yes RLE Weight  Bearing: Non weight bearing Pain: C/o RLE pain with mobility - rest breaks and repositioned as needed.   See Function Navigator for Current Functional Status.   Therapy/Group: Individual Therapy  Canary Brim Ivory Broad, PT, DPT  07/19/2015, 11:45 AM

## 2015-07-19 NOTE — Progress Notes (Signed)
Social Work Patient ID: [name], female   DOB: 22-Jun-1928, 82 y.o.   MRN: [id number]   Have reviewed team conference with pt and daughter (via phone).  Both aware and agreeable with targeted d/c date 5/12 with min assist w/c level goals.  Daughter reports that they have begun building ramp at her home today.  Notes she was able to speak with Dr. Naaman Plummer this morning to discuss her concerns about pain meds.  Daughter has also spoken with [facility] about bringing cushion and footrests for her w/c that she already has at home.  Daughter has also informed staff that several items of clothing are missing from pt's room.  Will follow up with nursing director on this.  Continue to follow for d/c planning and support.  [place] Giammona, LCSW

## 2015-07-19 NOTE — Progress Notes (Signed)
[facility] Progress Note  Assessment/Plan: 1. Right tib fx- s/p ORIF, working with PT 2. ESRD - HD MWF; records reviewed from outpt labs: see dialysis orders below 3. Anemia - Hgb 9.5 - Aranesp 60 q Fri started 4/28 - generally hasn't been on ESA - hgb have been stable 9-11- dose venofer 100 x 3 due to pre surgical tsat of 22% 4. Secondary hyperparathyroidism - P controlled; on vit D and phoslo- corrected Ca high ~10.9 - d/c vit D and hold phoslo for now- if P goes up add non Ca binder; previously on Hectorol 8; suspect increase Ca due to alb drop- resume as able - normally on a 2.5 Ca bath 5. HTN/volume - controlled  6. Nutrition - renal carb mod diet/vits/nepro 7. Increase in LFT AST 77 4/26 - was normal 4/22 -   Renal Attending: For HD today.  Will need to plan outpt hemodialysis in [place] since pt going home. Anvita Hirata C   Subjective:   No complaints this morning. Doing well.   Objective Filed Vitals:   07/18/15 1018 07/18/15 1406 07/18/15 2201 07/19/15 0531  BP: 151/44 121/34 116/54 112/30  Pulse: 49 49  55  Temp:  97.8 F (36.6 C)  98.1 F (36.7 C)  TempSrc:  Oral  Oral  Resp:  19  18  Height:      Weight:    134 lb 7.7 oz (61 kg)  SpO2:  98%  100%   Physical Exam General: alert sitting in chair, NAD Heart: RRR Lungs: CTA Abdomen: soft NT Extremities: right LE in immobilizer- left LE no sig edema Dialysis Access: right upper AVF + bruit  Dialysis Orders:  Center: [place]  and [facility] on MWF . EDW 64.5 HD Bath 3 K 2.5Ca Time 3.5hrs Heparin none. Access RUE AVF BFR 350  DFR 600 Hectorol 8 no Fe no ESA Max UF goal per hour 1300  Recent monthly labs drawn 07/08/15  hgb 12.1 4/22 ferritin 1178 Fe 54 tsat 22% iPTH 767 Ca 9.3 corrected P 5.3, AST 31 and ALT 6 alk phos 328 ; vit D level 31 01/2015  Additional Objective Labs: Basic Metabolic Panel:  Recent Labs Lab 07/13/15 0438 07/14/15 0503 07/17/15 1500  NA 138 137 135  K  4.1 4.5 4.1  CL 95* 93* 94*  CO2 '30 29 25  '$ GLUCOSE 242* 161* 222*  BUN 27* 45* 52*  CREATININE 4.03* 6.11* 7.68*  CALCIUM 9.1 9.5 9.3  PHOS  --  3.1 3.9  3.9   Liver Function Tests:  Recent Labs Lab 07/13/15 0438 07/14/15 0503 07/17/15 1500  AST 77*  --   --   ALT 12*  --   --   ALKPHOS 282*  --   --   BILITOT 0.7  --   --   PROT 6.3*  --   --   ALBUMIN 2.3* 2.2* 2.3*   CBC:  Recent Labs Lab 07/12/15 1555 07/13/15 0438 07/14/15 0503 07/16/15 0732 07/17/15 1500  WBC 10.1 9.9 13.1* 14.2* 12.5*  NEUTROABS  --  6.6  --  9.3*  --   HGB 10.7* 9.6* 10.0* 9.8* 9.5*  HCT 33.7* 29.7* 30.7* 30.3* 29.0*  MCV 95.2 96.1 95.6 97.7 95.1  PLT 281 292 316 385 391  CBG:  Recent Labs Lab 07/18/15 1724 07/18/15 1747 07/18/15 2027 07/19/15 0643 07/19/15 0713  GLUCAP 63* 82 264* 67 103*   Medications:   . aspirin  325 mg Oral Daily  . [  START ON 07/21/2015] darbepoetin (ARANESP) injection - DIALYSIS  60 mcg Intravenous Q Fri-HD  . diclofenac sodium  2 g Topical TID  . feeding supplement (NEPRO CARB STEADY)  237 mL Oral BID BM  . ferric gluconate (FERRLECIT/NULECIT) IV  125 mg Intravenous Q M,W,F-HD  . glimepiride  1 mg Oral Q breakfast  . heparin  5,000 Units Subcutaneous Q8H  . hydrALAZINE  50 mg Oral BID  . insulin aspart  0-24 Units Subcutaneous TID AC & HS  . isosorbide mononitrate  30 mg Oral Daily  . levothyroxine  75 mcg Oral QAC breakfast  . multivitamin  1 tablet Oral QHS  . nebivolol  10 mg Oral Daily  . pravastatin  20 mg Oral q1800  . vitamin E  400 Units Oral Daily

## 2015-07-19 NOTE — Patient Care Conference (Signed)
Inpatient RehabilitationTeam Conference and Plan of Care Update Date: 07/18/2015   Time: 2:20 PM    Patient Name: [name]      Medical Record Number: [id number]  Date of Birth: [birth year] Sex: Female         Room/Bed: 4M08C/4M08C-02 Payor Info: Payor: MEDICARE / Plan: MEDICARE PART A AND B / Product Type: *No Product type* /    Admitting Diagnosis: R tib fix fx  ESRD   HD  Admit Date/Time:  07/12/2015  2:59 PM Admission Comments: No comment available   Primary Diagnosis:  Closed fracture of right fibula and tibia Principal Problem: Closed fracture of right fibula and tibia  Patient Active Problem List   Diagnosis Date Noted  . Memory loss   . Hip fracture ([place]) 07/12/2015  . Swelling   . Post-operative pain   . Acute blood loss anemia   . ESRD on dialysis ([place]   . PAF (paroxysmal atrial fibrillation) ([place])   . Benign essential HTN   . Chronic diastolic congestive heart failure ([place])   . Diabetes mellitus type 2 in nonobese (H[facility])   . HLD (hyperlipidemia)   . Thyroid activity decreased   . Elevated brain natriuretic peptide (BNP) level 01/06/2014  . Chest pain, atypical 08/13/2012  . Long term (current) use of anticoagulants 07/30/2012  . Chronic combined systolic and diastolic heart failure EF 45-50% by echo 06/25/2012 07/30/2012  . Breast cancer, s/p Lt mastectomy 07/22/2012  . Chronic anticoagulation 07/22/2012  . Acute on chronic systolic and diastolic heart failure, NYHA class 4 ([place]) 07/20/2012  . Pleural effusion due to CHF (congestive heart failure) ([place]) 07/20/2012  . Atrial fibrillation, spont convers NSR 06/26/12 06/25/2012  . Hypotension, unspecified 06/25/2012  . Diastolic CHF, chronic ([place]) 06/25/2012  . Right tibial fracture 06/21/2012  . Metabolic bone disease [id number]  . Secondary hyperparathyroidism of renal origin ([place]) 06/19/2012  . Nonunion of fracture, R tibia 06/17/2012  . H/O radioactive iodine thyroid ablation   . Closed fracture of right  fibula and tibia 07/10/2011  . ESRD (end stage renal disease) ([place]) 07/10/2011  . HTN (hypertension) 07/10/2011  . DM2 (diabetes mellitus, type 2) ([place]) 07/10/2011  . Hyperlipidemia 07/10/2011  . Anemia 07/10/2011    Expected Discharge Date: Expected Discharge Date: 07/28/15  Team Members Present: Physician leading conference: Dr. Alger Simons Social Worker Present: Lennart Pall, LCSW Nurse Present: Dorien Chihuahua, RN PT Present: Canary Brim, PT OT Present: Napoleon Form, OT PPS Coordinator present : Daiva Nakayama, RN, CRRN     Current Status/Progress Goal Weekly Team Focus  Medical   right leg healing nicely. pain under control. staplpes out tomorrow. hands better with cream.   improved safety awareness, decreased pain  wound care, pain control, safety   Bowel/Bladder   Pt continent of bowel LBM 4-29. Oliguric.   manage b/b min assist  contiune to assess bowel pattern and intervene with prn medication as needed   Swallow/Nutrition/ Hydration             ADL's   min A bathing bed level only, min A to don pants, mod - max toileting for clothing management, mod A to sit to stand for transfer with RW. - pivots on L foot  min A bathing, set up/S dressing and toileting and toilet transfer, S standing balance  activity tolerance training with ADLS and ther ex, sit to stand, standing balance, AE training, pt/family education   Mobility   min to mod assist w/c level; max  assist standing; supervision/min w/c propulsion  supervision/min w/c level; d/c short distance gait goal  transfers, cognition, endurance, strengthening, activity tolerance, maintaining WB restrictions, d/c planning, education   Communication             Safety/Cognition/ Behavioral Observations            Pain   pain at times to RLE- tylenol 325-650 mg PRN q4hrs  4 or less  assess pain and medicate as needed   Skin   staples to RLE- non adherant pad with kerlex inplace,  free of skin breakdown and free from infection min  assist  assess skin daily change dressing RLE prn    Rehab Goals Patient on target to meet rehab goals: Yes *See Care Plan and progress notes for long and short-term goals.  Barriers to Discharge: cognition/safety awareness    Possible Resolutions to Barriers:  supervison at home, adaptive techniques and equipment    Discharge Planning/Teaching Needs:  Plan home with daughter, Lorenza Chick, in [place] and daughter arranging 24/7 assistance.  Teaching to be scheduled.   Team Discussion:  Expected to be able to remove staples tomorrow.  Cognition best in the morning.  Min assist w/c level goals and must have a ramp.  Having a lot of trouble maintain WB precautions.  Revisions to Treatment Plan:  None   Continued Need for Acute Rehabilitation Level of Care: The patient requires daily medical management by a physician with specialized training in physical medicine and rehabilitation for the following conditions: Daily direction of a multidisciplinary physical rehabilitation program to ensure safe treatment while eliciting the highest outcome that is of practical value to the patient.: Yes Daily medical management of patient stability for increased activity during participation in an intensive rehabilitation regime.: Yes Daily analysis of laboratory values and/or radiology reports with any subsequent need for medication adjustment of medical intervention for : Post surgical problems;Neurological problems;Wound care problems  Sims Laday 07/19/2015, 9:31 AM

## 2015-07-19 NOTE — Progress Notes (Signed)
[facility] PHYSICAL MEDICINE & REHABILITATION     PROGRESS NOTE    Subjective/Complaints: Feeling fairly well. Denies pain this am. Hands feeling better.     ROS: Pt denies fever, rash/itching, headache, blurred or double vision, nausea, vomiting, abdominal pain, diarrhea, chest pain, shortness of breath, palpitations, dysuria, dizziness, neck or back pain, bleeding, anxiety, or depression  Objective: Vital Signs: Blood pressure 112/30, pulse 55, temperature 98.1 F (36.7 C), temperature source Oral, resp. rate 18, height 5\' 1"  (1.549 m), weight 61 kg (134 lb 7.7 oz), SpO2 100 %. No results found.  Recent Labs  07/17/15 1500  WBC 12.5*  HGB 9.5*  HCT 29.0*  PLT 391    Recent Labs  07/17/15 1500  NA 135  K 4.1  CL 94*  GLUCOSE 222*  BUN 52*  CREATININE 7.68*  CALCIUM 9.3   CBG (last 3)   Recent Labs  07/18/15 2027 07/19/15 0643 07/19/15 0713  GLUCAP 264* 67 103*    Wt Readings from Last 3 Encounters:  07/19/15 61 kg (134 lb 7.7 oz)  07/11/15 66.225 kg (146 lb)  01/06/14 66.996 kg (147 lb 11.2 oz)    Physical Exam:  Constitutional: She is oriented to person, place, and time. She appears well-developed and well-nourished.  HENT:  Head: Normocephalic and atraumatic.  Eyes: Conjunctivae and EOM are normal.  Neck: Normal range of motion. Neck supple. No thyromegaly present.  Cardiovascular:  Irregular irregular RUE fistula  Respiratory: Effort normal and breath sounds normal. No respiratory distress.  GI: Soft. Bowel sounds are normal. She exhibits no distension.  Musculoskeletal: She exhibits edema and tenderness. Both first cmc's tender Neurological: She is alert and oriented to person, place, and time.  A&Ox2 DTRs symmetric Sensation intact to light touch Motor: B/l UE 4/5 proximal to distal LLE: 3- proximally, 4/5 distally RLE: hip flexion 2/5, wiggles toes  Skin:  Right leg wounds clean/dry with staples.  Leg still slightly swollen, bruised sm  amt dried blood on telfa Psychiatric: She has a normal mood and affect. Her behavior is normal.     Assessment/Plan: 1. Functional deficits secondary to right tib/fib fx which require 3+ hours per day of interdisciplinary therapy in a comprehensive inpatient rehab setting. Physiatrist is providing close team supervision and 24 hour management of active medical problems listed below. Physiatrist and rehab team continue to assess barriers to discharge/monitor patient progress toward functional and medical goals.  Function:  Bathing Bathing position   Position: Sitting EOB  Bathing parts Body parts bathed by patient: Right arm, Left arm, Chest, Abdomen, Front perineal area, Buttocks, Right upper leg, Left upper leg, Left lower leg Body parts bathed by helper: Back, Right lower leg  Bathing assist Assist Level:  (patient completed 70%, moderate assist)      Upper Body Dressing/Undressing Upper body dressing   What is the patient wearing?: Pull over shirt/dress, Bra Bra - Perfomed by patient: Thread/unthread right bra strap, Thread/unthread left bra strap Bra - Perfomed by helper: Hook/unhook bra (pull down sports bra) Pull over shirt/dress - Perfomed by patient: Thread/unthread right sleeve, Thread/unthread left sleeve, Put head through opening, Pull shirt over trunk Pull over shirt/dress - Perfomed by helper: Pull shirt over trunk        Upper body assist Assist Level: Touching or steadying assistance(Pt > 75%)      Lower Body Dressing/Undressing Lower body dressing   What is the patient wearing?: Underwear, Pants, Socks Underwear - Performed by patient: Pull underwear up/down, Thread/unthread  left underwear leg Underwear - Performed by helper: Thread/unthread right underwear leg Pants- Performed by patient: Thread/unthread left pants leg, Pull pants up/down Pants- Performed by helper: Thread/unthread right pants leg     Socks - Performed by patient: Don/doff left sock Socks -  Performed by helper: Don/doff right sock Shoes - Performed by patient: Don/doff left shoe Shoes - Performed by helper: Fasten left, Don/doff right shoe          Lower body assist Assist for lower body dressing:  (patient completed 20%, total assist)      Toileting Toileting   Toileting steps completed by patient: Performs perineal hygiene Toileting steps completed by helper: Adjust clothing after toileting, Adjust clothing prior to toileting Toileting Assistive Devices: Grab bar or rail  Toileting assist Assist level: Two helpers (per [employer], NT)   Transfers Chair/bed transfer   Chair/bed transfer method: Squat pivot Chair/bed transfer assist level: Moderate assist (Pt 50 - 74%/lift or lower) Chair/bed transfer assistive device: Armrests, Orthosis     Locomotion Ambulation Ambulation activity did not occur: Safety/medical concerns (unsafe on eval)         Wheelchair   Type: Manual Max wheelchair distance: 100' Assist Level: Supervision or verbal cues  Cognition Comprehension Comprehension assist level: Understands basic 75 - 89% of the time/ requires cueing 10 - 24% of the time  Expression Expression assist level: Expresses basic 75 - 89% of the time/requires cueing 10 - 24% of the time. Needs helper to occlude trach/needs to repeat words.  Social Interaction Social Interaction assist level: Interacts appropriately 75 - 89% of the time - Needs redirection for appropriate language or to initiate interaction.  Problem Solving Problem solving assist level: Solves basic 75 - 89% of the time/requires cueing 10 - 24% of the time  Memory Memory assist level: Recognizes or recalls 75 - 89% of the time/requires cueing 10 - 24% of the time    Medical Problem List and Plan: 1. Limited functional mobility secondary to comminuted right tibia-fibula fracture. Status post ORIF 07/07/2015. Nonweightbearing. Knee immobilizer when out of bed. No ROM precautions  -continue CIR  therapies--- 2. DVT Prophylaxis/Anticoagulation: Subcutaneous heparin. Monitor platelet counts and any signs of bleeding 3. Pain Management: tylenol only due to cogniitve side effects (family requested)  -added voltaren gel for bilateral hand OA 4. Acute blood loss anemia. hgb 9.6. Fe++ supp per nephrology 5. Neuropsych: This patient is capable of making decisions on her own behalf.She is oriented to person place and month, not taking much in terms of pain med per RN, may benefit from neuropsych eval 6. Skin/Wound Care: can change right leg dressing as needed  -will remove staples tomorrow 7. Fluids/Electrolytes/Nutrition: Routine I&O's with follow-up chemistries reviewed. Taking 40-75% meals 8. End-stage renal disease with hemodialysis. HD each day after therapy day. 9. PAF. Aspirin 325 mg daily. Cardiac rate control 10. Hypertension. Hydralazine 50 mg twice a day, Imdur 30 mg daily 11. Diastolic congestive heart failure. Monitor for any signs of fluid overload 12. Diabetes mellitus. Checking blood sugars before meals and at bedtime. Sliding scale insulin. (set higher ceiling on insulin)  -sugars higher in PM--develops hypoglycemia after rx of higher sugars  -added am amaryl 1mg . Titrate further if needed.   CBG (last 3)   Recent Labs  07/18/15 2027 07/19/15 0643 07/19/15 0713  GLUCAP 264* 67 103*   13. Hypothyroidism. Synthroid 75 g daily 14. Hyperlipidemia. Pravachol 15.  Leukocytosis but afebrile, no c/o except RLE pain, will monitor ,  incisions look ok.  -ua/cx not done---minimal urine output---dc CBC Latest Ref Rng 07/17/2015 07/16/2015 07/14/2015  WBC 4.0 - 10.5 K/uL 12.5(H) 14.2(H) 13.1(H)  Hemoglobin 12.0 - 15.0 g/dL 9.5(L) 9.8(L) 10.0(L)  Hematocrit 36.0 - 46.0 % 29.0(L) 30.3(L) 30.7(L)  Platelets 150 - 400 K/uL 391 385 316     LOS (Days) 7 A FACE TO FACE EVALUATION WAS PERFORMED  SWARTZ,ZACHARY T 07/19/2015 8:42 AM

## 2015-07-19 NOTE — Progress Notes (Signed)
Social Work Patient ID: [name], female   DOB: 1929/03/16, 82 y.o.   MRN: [id number]   Lowella Curb, LCSW Social Worker Signed  Patient Care Conference 07/19/2015  9:31 AM    Expand All Collapse All   Inpatient RehabilitationTeam Conference and Plan of Care Update Date: 07/18/2015   Time: 2:20 PM     Patient Name: [name]       Medical Record Number: [id number]  Date of Birth: 1928-11-13 Sex: Female         Room/Bed: 4M08C/4M08C-02 Payor Info: Payor: MEDICARE / Plan: MEDICARE PART A AND B / Product Type: *No Product type* /    Admitting Diagnosis: R tib fix fx  ESRD   HD   Admit Date/Time:  07/12/2015  2:59 PM Admission Comments: No comment available   Primary Diagnosis:  Closed fracture of right fibula and tibia Principal Problem: Closed fracture of right fibula and tibia    Patient Active Problem List     Diagnosis  Date Noted   .  Memory loss     .  Hip fracture ([place])  07/12/2015   .  Swelling     .  Post-operative pain     .  Acute blood loss anemia     .  ESRD on dialysis ([place])     .  PAF (paroxysmal atrial fibrillation) ([place])     .  Benign essential HTN     .  Chronic diastolic congestive heart failure ([place])     .  Diabetes mellitus type 2 in nonobese ([facility])     .  HLD (hyperlipidemia)     .  Thyroid activity decreased     .  Elevated brain natriuretic peptide (BNP) level  01/06/2014   .  Chest pain, atypical  08/13/2012   .  Long term (current) use of anticoagulants  07/30/2012   .  Chronic combined systolic and diastolic heart failure EF 45-50% by echo 06/25/2012  07/30/2012   .  Breast cancer, s/p Lt mastectomy  07/22/2012   .  Chronic anticoagulation  07/22/2012   .  Acute on chronic systolic and diastolic heart failure, NYHA class 4 ([place])  07/20/2012   .  Pleural effusion due to CHF (congestive heart failure) ([place])  07/20/2012   .  Atrial fibrillation, spont convers NSR 06/26/12  06/25/2012   .  Hypotension, unspecified  06/25/2012   .  Diastolic CHF,  chronic ([place])  06/25/2012   .  Right tibial fracture  06/21/2012   .  Metabolic bone disease  [id number]   .  Secondary hyperparathyroidism of renal origin ([place])  06/19/2012   .  Nonunion of fracture, R tibia  06/17/2012   .  H/O radioactive iodine thyroid ablation     .  Closed fracture of right fibula and tibia  07/10/2011   .  ESRD (end stage renal disease) ([place])  07/10/2011   .  HTN (hypertension)  07/10/2011   .  DM2 (diabetes mellitus, type 2) ([place])  07/10/2011   .  Hyperlipidemia  07/10/2011   .  Anemia  07/10/2011     Expected Discharge Date: Expected Discharge Date: 07/28/15  Team Members Present: Physician leading conference: Dr. Alger Simons Social Worker Present: Lennart Pall, LCSW Nurse Present: Dorien Chihuahua, RN PT Present: Canary Brim, PT OT Present: Napoleon Form, OT PPS Coordinator present : Daiva Nakayama, RN, CRRN        Current Status/Progress  Goal  Weekly Team  Focus   Medical     right leg healing nicely. pain under control. staplpes out tomorrow. hands better with cream.   improved safety awareness, decreased pain  wound care, pain control, safety   Bowel/Bladder     Pt continent of bowel LBM 4-29. Oliguric.   manage b/b min assist  contiune to assess bowel pattern and intervene with prn medication as needed   Swallow/Nutrition/ Hydration               ADL's     min A bathing bed level only, min A to don pants, mod - max toileting for clothing management, mod A to sit to stand for transfer with RW. - pivots on L foot  min A bathing, set up/S dressing and toileting and toilet transfer, S standing balance  activity tolerance training with ADLS and ther ex, sit to stand, standing balance, AE training, pt/family education    Mobility     min to mod assist w/c level; max assist standing; supervision/min w/c propulsion  supervision/min w/c level; d/c short distance gait goal  transfers, cognition, endurance, strengthening, activity tolerance, maintaining WB  restrictions, d/c planning, education    Communication               Safety/Cognition/ Behavioral Observations              Pain     pain at times to RLE- tylenol 325-650 mg PRN q4hrs   4 or less  assess pain and medicate as needed    Skin     staples to RLE- non adherant pad with kerlex inplace,   free of skin breakdown and free from infection min assist   assess skin daily change dressing RLE prn    Rehab Goals Patient on target to meet rehab goals: Yes *See Care Plan and progress notes for long and short-term goals.    Barriers to Discharge:  cognition/safety awareness     Possible Resolutions to Barriers:   supervison at home, adaptive techniques and equipment      Discharge Planning/Teaching Needs:   Plan home with daughter, Lorenza Chick, in [place] and daughter arranging 24/7 assistance.  Teaching to be scheduled.    Team Discussion:    Expected to be able to remove staples tomorrow.  Cognition best in the morning.  Min assist w/c level goals and must have a ramp.  Having a lot of trouble maintain WB precautions.   Revisions to Treatment Plan:    None    Continued Need for Acute Rehabilitation Level of Care: The patient requires daily medical management by a physician with specialized training in physical medicine and rehabilitation for the following conditions: Daily direction of a multidisciplinary physical rehabilitation program to ensure safe treatment while eliciting the highest outcome that is of practical value to the patient.: Yes Daily medical management of patient stability for increased activity during participation in an intensive rehabilitation regime.: Yes Daily analysis of laboratory values and/or radiology reports with any subsequent need for medication adjustment of medical intervention for : Post surgical problems;Neurological problems;Wound care problems  [name] 07/19/2015, 9:31 AM

## 2015-07-19 NOTE — Progress Notes (Signed)
Occupational Therapy Session Note  Patient Details  Name: [name] MRN: [id number] Date of Birth: 08/28/1928  Today's Date: 07/19/2015 OT Individual Time: [id number] OT Individual Time Calculation (min): 75 min    Short Term Goals: Week 1:  OT Short Term Goal 1 (Week 1): Pt will complete stand pivot transfer to [facility] with mod A OT Short Term Goal 2 (Week 1): Pt will carry over transfer technique of stand pivot transfer with min cues OT Short Term Goal 3 (Week 1): Pt will don pants using AE PRN with min A OT Short Term Goal 4 (Week 1): Pt will complete toileting task with mod A.  Skilled Therapeutic Interventions/Progress Updates:    Pt seen for OT ADL bathing/dressing session. Pt in supine upon arrival with RN present administering morning meds. Pt agreeable to tx session. She voiced desire to complete bathing/dressing from EOB vs in w/c at sink. SHe transferred to EOB with min A for management of R LE and increased time using hospital bed functions. She bathed seated EOB with set-up requiring rest breaks throughout due to fatigue. R LE support on small step stool as pt unable to tolerate R LE being completely unsupported.  Lateral leans used to complete buttock hygiene.Attempted education on use of reacher for dressing tasks, however, pt with decreased ability for new learning and reacher appeared to be more trouble than help. Assist provided to thread R LE into underwear/ pants. She transitioned back to supine to roll to pull pants up.  She completed max A stand pivot transfer from EOB to [facility]. Educated regarding options for toileting task of lateral leans vs. Standing to pull pants up/down. She completed lateral leans with steadying assist to pull pants down, however, had difficulty getting pants back up using leans and fatigued quickly using this method. She complted sit > stand at [facility] with min A and max steadying assist provided while pt pulled pants up remainder of way.  She transferred to  recliner via stand pivot with max A.  She voiced increased discomfort in back/ shoulder. Light message provided to traps and kinesiotape applied following education od use and purpose of tape.  Pt left sitting in recliner at end of session, all needs in reach.   Therapy Documentation Precautions:  Precautions Precautions: Fall Precaution Comments: HD MWF Required Braces or Orthoses: Knee Immobilizer - Right Knee Immobilizer - Right: On when out of bed or walking Restrictions Weight Bearing Restrictions: Yes RLE Weight Bearing: Non weight bearing Pain: Pain Assessment Pain Assessment: No/denies pain Pain Score: 2  Pain Location: Leg Pain Orientation: Right Pain Intervention(s): Repositioned  See Function Navigator for Current Functional Status.   Therapy/Group: Individual Therapy  Lewis, Angelamarie Avakian C 07/19/2015, 7:12 AM

## 2015-07-19 NOTE — Progress Notes (Addendum)
Physical Therapy Session Note  Patient Details  Name: [name] MRN: [id number] Date of Birth: 03/01/1929  Today's Date: 07/19/2015 PT Individual Time: 1008-1031 PT Individual Time Calculation (min): 23 min   Short Term Goals: Week 1:  PT Short Term Goal 1 (Week 1): Pt will be able to perform basic transfers with min assist PT Short Term Goal 2 (Week 1): Pt will be able to perform sit <> stands with min assist PT Short Term Goal 3 (Week 1): Pt will be able to propel w/c x 150' with supervision PT Short Term Goal 4 (Week 1): Pt will be able to initiate short distance gait  Skilled Therapeutic Interventions/Progress Updates:   Therapeutic exercise performed with LE to increase strength for functional mobility: seated, with R KI donned, RLE elevated on footstool: 10 x 1 trunk flexion/ext with bil UE reaching; L calf raises, R toe flex/ext, 5 x 3 R active assistive hip abd/adduction.   Reciprocal scooting forward/backward in recliner for core activiation. Sit> squat position from recliner, NWBing RLE, x 3; sit> stand from recliner x 1 with mod assist, limted by pain.  Pt left resting reclined in recliner with all needs within reach.     Therapy Documentation Precautions:  Precautions Precautions: Fall Precaution Comments: HD MWF Required Braces or Orthoses: Knee Immobilizer - Right Knee Immobilizer - Right: On when out of bed or walking Restrictions Weight Bearing Restrictions: Yes RLE Weight Bearing: Non weight bearing  Pain Assessment Pain Score: 2  Pain Location: Leg Pain Orientation: Right Pain Intervention(s): Medication (See eMAR)      See Function Navigator for Current Functional Status.   Therapy/Group: Individual Therapy  Victorious Cosio 07/19/2015, 10:31 AM

## 2015-07-19 NOTE — Consult Note (Signed)
  NEUROBEHAVIORAL STATUS EXAM - [facility]   MEDICAL NECESSITY:  [name] was seen on the [facility] for a neurobehavioral status exam to assist in treatment planning during admission.   Records indicate that [name] is an "82 year old right-handed female well known to rehabilitation services for history of right pilon fracture received inpatient rehabilitation services 2 years ago and was discharged home with daughter.Presented 07/05/2015 [name] after mechanical fall without loss of consciousness. Sustained comminuted fracture right tibia-fibula. Underwent ORIF 07/07/2015 per Dr. Pollyann Glen. Nonweightbearing with knee immobilizer when out of bed. Hospital course pain management. Hemodialysis ongoing as per renal services. Placed on subcutaneous heparin for DVT prophylaxis bouts of leukocytosis 14,000 felt to be reactive workup was unremarkable. Acute on chronic anemia 10.1 and monitor. Physical occupational therapy evaluations completed ongoing requiring minimal assist for mobility patient was admitted for comprehensive rehabilitation program."   During today's visit, [name] denied experiencing any major cognitive changes. However, she seemed mildly confused at times. She described her current mood as "pretty good" and denied suffering any overt depressive or anxious symptoms. She did express a desire to discharge home to her family as soon as possible. Her psychiatric history, in general, is benign. No adjustment issues endorsed. Suicidal/homicidal ideation, plan or intent was denied. No manic or hypomanic episodes were reported. The patient denied ever experiencing any auditory/visual hallucinations. No major behavioral or personality changes were endorsed.   [name] feels that she is making progress in therapy. She has thoroughly enjoyed the rehab staff. She has her  family to support her throughout this endeavor. Cognition could be a barrier to therapy.   PROCEDURES: [2 units 96116] Diagnostic clinical interview  Review of available records Montreal Cognitive Assessment (Blind)  MENTAL STATUS: Mental status examination suggests the presence of multiple deficits across a number of cognitive domains including learning and memory, attention, response inhibition, working memory, processing speed, repetition, letter fluency, and abstraction. Patient was generally oriented to time and place.    IMPRESSION: Overall, [name] denied suffering from any cognitive deficits, though mental status exam suggests severe deficits; at the level of dementia. Without a thorough dementia workup, the etiology is difficult to ascertain, though Alzheimer's disease is a very real possibility. No major mood symptoms reported. I recommend follow-up with her care providers for the following (if not already obtained): neuroimaging, standard dementia labs (i.e., thyroid panel, homocysteine, B12, etc.), and more thorough cognitive assessment. Neurology consultation could be valuable. Dependent upon etiology, nootropics are warranted. The need for 24/7 supervision seems apparent.    PROVISIONAL DIAGNOSIS:  Unspecified Cognitive Disorder R/O Major Neurocognitive Disorder (i.e., dementia) with Alzheimer's disease being possible    Rutha Bouchard, Psy.D.  Clinical Neuropsychologist

## 2015-07-20 ENCOUNTER — Inpatient Hospital Stay (HOSPITAL_COMMUNITY): Payer: Medicare Other | Admitting: Occupational Therapy

## 2015-07-20 ENCOUNTER — Inpatient Hospital Stay (HOSPITAL_COMMUNITY): Payer: Medicare Other

## 2015-07-20 LAB — GLUCOSE, CAPILLARY
GLUCOSE-CAPILLARY: 184 mg/dL — AB (ref 65–99)
GLUCOSE-CAPILLARY: 189 mg/dL — AB (ref 65–99)
Glucose-Capillary: 139 mg/dL — ABNORMAL HIGH (ref 65–99)
Glucose-Capillary: 213 mg/dL — ABNORMAL HIGH (ref 65–99)
Glucose-Capillary: 235 mg/dL — ABNORMAL HIGH (ref 65–99)

## 2015-07-20 MED ORDER — GLIMEPIRIDE 1 MG PO TABS
1.0000 mg | ORAL_TABLET | ORAL | Status: AC
  Administered 2015-07-20: 1 mg via ORAL
  Filled 2015-07-20: qty 1

## 2015-07-20 MED ORDER — GLUCERNA SHAKE PO LIQD
237.0000 mL | Freq: Two times a day (BID) | ORAL | Status: DC
  Administered 2015-07-20 – 2015-07-28 (×12): 237 mL via ORAL

## 2015-07-20 MED ORDER — GLIMEPIRIDE 2 MG PO TABS
2.0000 mg | ORAL_TABLET | Freq: Every day | ORAL | Status: DC
  Administered 2015-07-21 – 2015-07-23 (×3): 2 mg via ORAL
  Filled 2015-07-20 (×3): qty 1

## 2015-07-20 NOTE — Progress Notes (Signed)
Nutrition Follow-up  DOCUMENTATION CODES:   Not applicable  INTERVENTION:  Discontinue Nepro shake.  Provide Glucerna Shake po BID, each supplement provides 220 kcal and 10 grams of protein.  Encourage adequate PO intake.   NUTRITION DIAGNOSIS:   Increased nutrient needs related to chronic illness as evidenced by estimated needs; ongoing  GOAL:   Patient will meet greater than or equal to 90% of their needs; met  MONITOR:   PO intake, Supplement acceptance, Weight trends, Labs, I & O's  REASON FOR ASSESSMENT:   Malnutrition Screening Tool    ASSESSMENT:   82 year old right-handed female well known to rehabilitation services for history of right pilon fracture received inpatient rehabilitation services 2 years ago and was discharged home with daughter. Past medical history of end-stage renal disease with hemodialysis, diabetes mellitus, left breast cancer, PAF maintained on aspirin 81 mg, chronic diastolic congestive heart failure. Presented 07/05/2015 [place] after mechanical fall without loss of consciousness. Sustained comminuted fracture right tibia-fibula. Underwent ORIF 07/07/2015  RD contacted via RN regarding pt's blood glucose repeatedly elevated/rising after consumption of Nepro Shakes. Supplements have been modified to provide Glucerna Shake instead to better control bloog glucose. Meal completion has been varied from 25-100% with most intake at 75%. Labs and medications reviewed. CBG's [id number].  Diet Order:  Diet renal/carb modified with fluid restriction Diet-HS Snack?: Nothing; Room service appropriate?: Yes; Fluid consistency:: Thin  Skin:   (Incision on R tibial)  Last BM:  5/2  Height:   Ht Readings from Last 1 Encounters:  07/12/15 _0  (1.549 m)    Weight:   Wt Readings from Last 1 Encounters:  07/20/15 133 lb 13.1 oz (60.7 kg)    Ideal Body Weight:  47.7 kg  BMI:  Body mass index is 25.3  kg/(m^2).  Estimated Nutritional Needs:   Kcal:  1650-1850  Protein:  75-85 grams  Fluid:  1.2 L/day  EDUCATION NEEDS:   No education needs identified at this time  Corrin Parker, MS, RD, LDN Pager # [phone] After hours/ weekend pager # [phone]

## 2015-07-20 NOTE — Progress Notes (Signed)
[facility] Progress Note  Assessment/Plan: 1. Right tib fx- s/p ORIF, working with PT 2. ESRD - HD MWF; records reviewed from outpt labs: see dialysis orders below  - Will need to plan for outpatient dialysis in [place] while patient stays with her daughter after discharge.  3. Anemia - Hgb 9.5 - Aranesp 60 q Fri started 4/28 - generally hasn't been on ESA - hgb have been stable 9-11- dose venofer 100 x 3 due to pre surgical tsat of 22% 4. Secondary hyperparathyroidism - P controlled; on vit D and phoslo- corrected Ca high ~10.9 - d/c vit D and hold phoslo for now- if P goes up add non Ca binder; previously on Hectorol 8; suspect increase Ca due to alb drop- resume as able - normally on a 2.5 Ca bath 5. HTN/volume - controlled  6. Nutrition - renal carb mod diet/vits/nepro 7. Increase in LFT AST 77 4/26 - was normal 4/22 -   Renal Attending: Stable on rehab.  We will arrange transfer of dialysis units since pt relocating to [place] to stay with daughter during her recovery. Hesham Wom[place] C   Subjective:   No complaints this morning. Doing well. Tolerated dialysis well yesterday.   Objective Filed Vitals:   07/19/15 1800 07/19/15 1824 07/19/15 1956 07/20/15 0504  BP: 113/39 114/44 127/46 152/38  Pulse: 60 67  63  Temp:  97.4 F (36.3 C)  98.6 F (37 C)  TempSrc:  Oral  Oral  Resp: _0 Height:      Weight:  138 lb 0.1 oz (62.6 kg)  133 lb 13.1 oz (60.7 kg)  SpO2:    99%   Physical Exam General: alert sitting in chair, NAD Heart: RRR Lungs: CTA Abdomen: soft NT Extremities: right LE in immobilizer- left LE no sig edema Dialysis Access: right upper AVF + bruit  Dialysis Orders:  Center: [place] [facility]  and [facility] on MWF . EDW 64.5 HD Bath 3 K 2.5Ca Time 3.5hrs Heparin none. Access RUE AVF BFR 350  DFR 600 Hectorol 8 no Fe no ESA Max UF goal per hour 1300  Recent monthly labs drawn 07/08/15  hgb 12.1 4/22 ferritin 1178 Fe 54 tsat 22% iPTH 767 Ca  9.3 corrected P 5.3, AST 31 and ALT 6 alk phos 328 ; vit D level 31 01/2015  Additional Objective Labs: Basic Metabolic Panel:  Recent Labs Lab 07/14/15 0503 07/17/15 1500 07/19/15 1501  NA 137 135 136  K 4.5 4.1 3.7  CL 93* 94* 95*  CO2 _1 GLUCOSE 161* 222* 75  BUN 45* 52* 33*  CREATININE 6.11* 7.68* 6.56*  CALCIUM 9.5 9.3 9.2  PHOS 3.1 3.9  3.9 4.2   Liver Function Tests:  Recent Labs Lab 07/14/15 0503 07/17/15 1500 07/19/15 1501  ALBUMIN 2.2* 2.3* 2.4*   CBC:  Recent Labs Lab 07/14/15 0503 07/16/15 0732 07/17/15 1500 07/19/15 1445  WBC 13.1* 14.2* 12.5* 12.9*  NEUTROABS  --  9.3*  --   --   HGB 10.0* 9.8* 9.5* 9.9*  HCT 30.7* 30.3* 29.0* 29.6*  MCV 95.6 97.7 95.1 94.9  PLT 316 385 391 394  CBG:  Recent Labs Lab 07/19/15 0713 07/19/15 1131 07/19/15 1858 07/19/15 2052 07/20/15 0623  GLUCAP 103* 371* 117* 166* 184*   Medications:   . aspirin  325 mg Oral Daily  . [START ON 07/21/2015] darbepoetin (ARANESP) injection - DIALYSIS  60 mcg Intravenous Q Fri-HD  . diclofenac sodium  2 g Topical TID  . feeding supplement (NEPRO CARB STEADY)  237 mL Oral BID BM  . ferric gluconate (FERRLECIT/NULECIT) IV  125 mg Intravenous Q M,W,F-HD  . glimepiride  1 mg Oral NOW  . [START ON 07/21/2015] glimepiride  2 mg Oral Q breakfast  . heparin  5,000 Units Subcutaneous Q8H  . hydrALAZINE  50 mg Oral BID  . insulin aspart  0-24 Units Subcutaneous TID AC & HS  . isosorbide mononitrate  30 mg Oral Daily  . levothyroxine  75 mcg Oral QAC breakfast  . multivitamin  1 tablet Oral QHS  . nebivolol  10 mg Oral Daily  . pravastatin  20 mg Oral q1800  . vitamin E  400 Units Oral Daily

## 2015-07-20 NOTE — Progress Notes (Signed)
Physical Therapy Weekly Progress Note  Patient Details  Name: [name] MRN: [id number] Date of Birth: 1928-10-27  Beginning of progress report period: July 13, 2015 End of progress report period: Jul 20, 2015   Patient has met 2 of 4 short term goals.  Pt partially met 2 goals (min assist transfers and sit <> stands) due to inconsistent performance due to varying levels of cognition. Pt can be min assist but still at times requires more assist up to max assist. Cognition significantly has impacted carryover with mobility and pain limits upright tolerance. Recommending 24/7 hands on assist upon d/c. Family planning to hire caregiver and building a ramp for home entry.   Patient continues to demonstrate the following deficits: pain, decreased endurance, decreased strength, decreased balance, decreased ROM, WB restrictions, decreased functional mobility and therefore will continue to benefit from skilled PT intervention to enhance overall performance with activity tolerance, balance, postural control, ability to compensate for deficits, attention, awareness and knowledge of precautions.  Patient progressing toward long term goals..  Plan of care revisions: Discharged gait goal and modified standing goals due to inconsistent levels of assist due to cognition..  PT Short Term Goals Week 1:  PT Short Term Goal 1 (Week 1): Pt will be able to perform basic transfers with min assist PT Short Term Goal 1 - Progress (Week 1): Partly met (inconsisent from min to max assist) PT Short Term Goal 2 (Week 1): Pt will be able to perform sit <> stands with min assist PT Short Term Goal 2 - Progress (Week 1): Partly met (inconsistent from min to max assist) PT Short Term Goal 3 (Week 1): Pt will be able to propel w/c x 150' with supervision PT Short Term Goal 3 - Progress (Week 1): Met PT Short Term Goal 4 (Week 1): Pt will be able to initiate short distance gait PT Short Term Goal 4 - Progress (Week 1):  Met Week 2:  PT Short Term Goal 1 (Week 2): = LTGs  Skilled Therapeutic Interventions/Progress Updates:  Ambulation/gait training;Balance/vestibular training;Community reintegration;Discharge planning;Disease management/prevention;DME/adaptive equipment instruction;Functional mobility training;Neuromuscular re-education;Pain management;Patient/family education;Psychosocial support;Skin care/wound management;Splinting/orthotics;Stair training;Therapeutic Activities;Therapeutic Exercise;UE/LE Strength taining/ROM;UE/LE Coordination activities;Wheelchair propulsion/positioning;Cognitive remediation/compensation   Therapy Documentation Precautions:  Precautions Precautions: Fall Precaution Comments: HD MWF Required Braces or Orthoses: Knee Immobilizer - Right Knee Immobilizer - Right: On when out of bed or walking Restrictions Weight Bearing Restrictions: Yes RLE Weight Bearing: Non weight bearing    See Function Navigator for Current Functional Status.  Therapy/Group: Individual Therapy  Canary Brim Ivory Broad, PT, DPT  07/20/2015, 12:06 PM

## 2015-07-20 NOTE — Plan of Care (Signed)
Problem: RH Simple Meal Prep Goal: LTG Patient will perform simple meal prep w/assist (OT) LTG: Patient will perform simple meal prep with assistance, with/without cues (OT).  Outcome: Not Applicable Date Met:  18/55/01 Goal d/c as pt d/c home with family/ caregiver who will complete meal prep. Pt not safe from cognitive stand point to complete meal prep independently at this time. -

## 2015-07-20 NOTE — Progress Notes (Signed)
Physical Therapy Session Note  Patient Details  Name: [name] MRN: [id number] Date of Birth: [birth year]  Today's Date: 07/20/2015 PT Individual Time: 0900-1000 PT Individual Time Calculation (min): 60 min   Short Term Goals: Week 1:  PT Short Term Goal 1 (Week 1): Pt will be able to perform basic transfers with min assist PT Short Term Goal 2 (Week 1): Pt will be able to perform sit <> stands with min assist PT Short Term Goal 3 (Week 1): Pt will be able to propel w/c x 150' with supervision PT Short Term Goal 4 (Week 1): Pt will be able to initiate short distance gait  Skilled Therapeutic Interventions/Progress Updates:    Session focused on functional bed mobility, various transfers, w/c mobility, and short distance gait with RW. Pt required total assist to don KI and min A for bed mobility due to assist needed to support RLE once getting to EOB. Mod to max verbal cues during transfers for hand placement, sequencing, and technique. Pt able to get into car from w/c with short distance gait with min assist for transfer and to bring RLE into car but mod assist for out of car due to decreased ability to maintain hands in a safe position to perform transfer. Gait x 10' in open space to therapy mat with min assist and cues for upright posture and positioning of RW. Pt continues to be confused during session and demonstrating decreased short term memory which affects carryover with mobility.   Therapy Documentation Precautions:  Precautions Precautions: Fall Precaution Comments: HD MWF Required Braces or Orthoses: Knee Immobilizer - Right Knee Immobilizer - Right: On when out of bed or walking Restrictions Weight Bearing Restrictions: Yes RLE Weight Bearing: Non weight bearing   Pain: c/o neck pain - premedicated.   See Function Navigator for Current Functional Status.   Therapy/Group: Individual Therapy  Canary Brim Ivory Broad, PT, DPT  07/20/2015, 11:30 AM

## 2015-07-20 NOTE — Progress Notes (Signed)
Occupational Therapy Session Note  Patient Details  Name: [name] MRN: [id number] Date of Birth: 1928/04/17  Today's Date: 07/20/2015 OT Individual Time: [id number] OT Individual Time Calculation (min): 70 min    Short Term Goals: Week 2:  OT Short Term Goal 1 (Week 2): STG=LTG due to LOS  Skilled Therapeutic Interventions/Progress Updates:    1:1 self care retraining. Pt in w/c when arrived. Pt ambulated 3-5 feet into bathroom (from door frame) to toilet with RW with more than reasonable time. Pt performed transfer to toilet with min A. Pt performed lower body bathing and dressing sitting on BSC over commode with right Le supported on foot stool. Pt able to thread underwear and pants over right LE today without using AE and up over her brace. Pt able to pull up underwear but did need help with adjustment of pants with min A for standing balance with RW. Pt ambulated back out to w/c at doorway and completed UB bathing and dressing and grooming at sink. Pt demonstrated increased standing balance to min A today and ability to recall without cuing weight bearing precautions. Applied disposable heat packs to neck at end of session for  Reports of shoulder discomfort.    Therapy Documentation Precautions:  Precautions Precautions: Fall Precaution Comments: HD MWF Required Braces or Orthoses: Knee Immobilizer - Right Knee Immobilizer - Right: On when out of bed or walking Restrictions Weight Bearing Restrictions: Yes RLE Weight Bearing: Non weight bearing Pain:  soreness in right LE  See Function Navigator for Current Functional Status.   Therapy/Group: Individual Therapy  [name] [facility] 07/20/2015, 5:13 PM

## 2015-07-20 NOTE — Progress Notes (Signed)
[place] [facility]     PROGRESS NOTE    Subjective/Complaints: Feeling fairly well. Denies pain this am. Hands feeling better.     ROS: Pt denies fever, rash/itching, headache, blurred or double vision, nausea, vomiting, abdominal pain, diarrhea, chest pain, shortness of breath, palpitations, dysuria, dizziness, neck or back pain, bleeding, anxiety, or depression  Objective: Vital Signs: Blood pressure 152/38, pulse 63, temperature 98.6 F (37 C), temperature source Oral, resp. rate 18, height 5\' 1"  (1.549 m), weight 60.7 kg (133 lb 13.1 oz), SpO2 99 %. No results found.  Recent Labs  07/17/15 1500 07/19/15 1445  WBC 12.5* 12.9*  HGB 9.5* 9.9*  HCT 29.0* 29.6*  PLT 391 394    Recent Labs  07/17/15 1500 07/19/15 1501  NA 135 136  K 4.1 3.7  CL 94* 95*  GLUCOSE 222* 75  BUN 52* 33*  CREATININE 7.68* 6.56*  CALCIUM 9.3 9.2   CBG (last 3)   Recent Labs  07/19/15 1858 07/19/15 2052 07/20/15 0623  GLUCAP 117* 166* 184*    Wt Readings from Last 3 Encounters:  07/20/15 60.7 kg (133 lb 13.1 oz)  07/11/15 66.225 kg (146 lb)  01/06/14 66.996 kg (147 lb 11.2 oz)    Physical Exam:  Constitutional: She is oriented to person, place, and time. She appears well-developed and well-nourished.  HENT:  Head: Normocephalic and atraumatic.  Eyes: Conjunctivae and EOM are normal.  Neck: Normal range of motion. Neck supple. No thyromegaly present.  Cardiovascular:  Irregular irregular RUE fistula  Respiratory: Effort normal and breath sounds normal. No respiratory distress.  GI: Soft. Bowel sounds are normal. She exhibits no distension.  Musculoskeletal: She exhibits edema and tenderness. Both first cmc's tender Neurological: She is alert and oriented to person, place, and time.  A&Ox2 DTRs symmetric Sensation intact to light touch Motor: B/l UE 4/5 proximal to distal LLE: 3 proximally, 4/5 distally RLE: hip flexion 2/5, wiggles toes   Skin:  Right leg wounds clean/dry with staples--1.5" area of eschar on patella (staples within).   Psychiatric: She has a normal mood and affect. Her behavior is normal.     Assessment/Plan: 1. Functional deficits secondary to right tib/fib fx which require 3+ hours per day of interdisciplinary therapy in a comprehensive inpatient rehab setting. Physiatrist is providing close team supervision and 24 hour management of active medical problems listed below. Physiatrist and rehab team continue to assess barriers to discharge/monitor patient progress toward functional and medical goals.  Function:  Bathing Bathing position   Position: Sitting EOB  Bathing parts Body parts bathed by patient: Right arm, Left arm, Chest, Abdomen, Front perineal area, Buttocks, Right upper leg, Left upper leg, Left lower leg Body parts bathed by helper: Back  Bathing assist Assist Level:  (patient completed 70%, moderate assist)      Upper Body Dressing/Undressing Upper body dressing   What is the patient wearing?: Pull over shirt/dress, Bra Bra - Perfomed by patient: Thread/unthread right bra strap, Thread/unthread left bra strap, Hook/unhook bra (pull down sports bra) Bra - Perfomed by helper: Hook/unhook bra (pull down sports bra) Pull over shirt/dress - Perfomed by patient: Thread/unthread right sleeve, Thread/unthread left sleeve, Put head through opening, Pull shirt over trunk Pull over shirt/dress - Perfomed by helper: Pull shirt over trunk        Upper body assist Assist Level: Set up, Supervision or verbal cues   Set up : To obtain clothing/put away  Lower Body Dressing/Undressing Lower body  dressing   What is the patient wearing?: Underwear, Pants Underwear - Performed by patient: Thread/unthread right underwear leg, Thread/unthread left underwear leg, Pull underwear up/down Underwear - Performed by helper: Thread/unthread right underwear leg Pants- Performed by patient: Thread/unthread  left pants leg, Pull pants up/down Pants- Performed by helper: Thread/unthread right pants leg     Socks - Performed by patient: Don/doff left sock Socks - Performed by helper: Don/doff right sock Shoes - Performed by patient: Don/doff left shoe Shoes - Performed by helper: Fasten left, Don/doff right shoe          Lower body assist Assist for lower body dressing:  (patient completed 20%, total assist)      Toileting Toileting   Toileting steps completed by patient: Performs perineal hygiene Toileting steps completed by helper: Adjust clothing after toileting, Adjust clothing prior to toileting Toileting Assistive Devices: Grab bar or rail  Toileting assist Assist level: Two helpers (per [employer], NT)   Transfers Chair/bed transfer   Chair/bed transfer method: Lateral scoot Chair/bed transfer assist level: Touching or steadying assistance (Pt > 75%) Chair/bed transfer assistive device: Armrests, Orthosis, Sliding board     Locomotion Ambulation Ambulation activity did not occur: Safety/medical concerns (unsafe on eval)         Wheelchair   Type: Manual Max wheelchair distance: 100' Assist Level: Supervision or verbal cues  Cognition Comprehension Comprehension assist level: Understands basic 75 - 89% of the time/ requires cueing 10 - 24% of the time  Expression Expression assist level: Expresses basic 75 - 89% of the time/requires cueing 10 - 24% of the time. Needs helper to occlude trach/needs to repeat words.  Social Interaction Social Interaction assist level: Interacts appropriately 75 - 89% of the time - Needs redirection for appropriate language or to initiate interaction.  Problem Solving Problem solving assist level: Solves basic 75 - 89% of the time/requires cueing 10 - 24% of the time  Memory Memory assist level: Recognizes or recalls 75 - 89% of the time/requires cueing 10 - 24% of the time    Medical Problem List and Plan: 1. Limited functional mobility  secondary to comminuted right tibia-fibula fracture. Status post ORIF 07/07/2015. Nonweightbearing. Knee immobilizer when out of bed. No ROM precautions  -continue CIR therapies--- 2. DVT Prophylaxis/Anticoagulation: Subcutaneous heparin. Monitor platelet counts and any signs of bleeding 3. Pain Management: tylenol only due to cogniitve side effects (family requested)  -added voltaren gel for bilateral hand OA 4. Acute blood loss anemia. hgb 9.6. Fe++ supp per nephrology 5. Neuropsych: This patient is capable of making decisions on her own behalf.She is oriented to person place and month, not taking much in terms of pain med per RN, may benefit from neuropsych eval 6. Skin/Wound Care: can change right leg dressing as needed  -will remove staples Friday given area of eschar on patella 7. Fluids/Electrolytes/Nutrition: Routine I&O's with follow-up chemistries reviewed. Taking 40-75% meals 8. End-stage renal disease with hemodialysis. HD each day after therapy day. 9. PAF. Aspirin 325 mg daily. Cardiac rate control 10. Hypertension. Hydralazine 50 mg twice a day, Imdur 30 mg daily 11. Diastolic congestive heart failure. Monitor for any signs of fluid overload 12. Diabetes mellitus. Checking blood sugars before meals and at bedtime.      -increase am amaryl to 2mg .   -reduce SSI.   CBG (last 3)   Recent Labs  07/19/15 1858 07/19/15 2052 07/20/15 0623  GLUCAP 117* 166* 184*   13. Hypothyroidism. Synthroid 75 g daily  14. Hyperlipidemia. Pravachol 15.  Leukocytosis but afebrile, no c/o except RLE pain, will monitor , incisions look ok.   CBC Latest Ref Rng 07/19/2015 07/17/2015 07/16/2015  WBC 4.0 - 10.5 K/uL 12.9(H) 12.5(H) 14.2(H)  Hemoglobin 12.0 - 15.0 g/dL 9.9(L) 9.5(L) 9.8(L)  Hematocrit 36.0 - 46.0 % 29.6(L) 29.0(L) 30.3(L)  Platelets 150 - 400 K/uL 394 391 385     LOS (Days) 8 A FACE TO FACE EVALUATION WAS PERFORMED  [name] T 07/20/2015 8:28 AM

## 2015-07-20 NOTE — Progress Notes (Signed)
Occupational Therapy Weekly Progress Note  Patient Details  Name: [name] MRN: [id number] Date of Birth: 21-Dec-1928  Beginning of progress report period: July 13, 2015 End of progress report period: Jul 20, 2015  Today's Date: 07/20/2015 OT Individual Time: 1300-1400 OT Individual Time Calculation (min): 60 min    Patient has met 3 of 4 short term goals.  Pt making slow but steady progress towards OT goals. She has been limited in OT sessions by pain, fatigue, and inconsistent cognitive levels. She requires min-max A for stand pivot functional transfers and mod-max cuing for transfer techniques and maintaining NWBing status.   Patient continues to demonstrate the following deficits: acute pain, muscle weakness (generalized) and pain in joint and therefore will continue to benefit from skilled OT intervention to enhance overall performance with BADL and Reduce care partner burden.  Patient not progressing toward long term goals.  See goal revision..  Plan of care revisions: Pt's goals have been downgraded from supervision to overall min A. She requires steadying assist for standing tasks. Pt flucuates with amount of assist required depending on level of fatigue and current cognitve status. .  OT Short Term Goals Week 1:  OT Short Term Goal 1 (Week 1): Pt will complete stand pivot transfer to [facility] with mod A OT Short Term Goal 1 - Progress (Week 1): Met OT Short Term Goal 2 (Week 1): Pt will carry over transfer technique of stand pivot transfer with min cues OT Short Term Goal 2 - Progress (Week 1): Met OT Short Term Goal 3 (Week 1): Pt will don pants using AE PRN with min A OT Short Term Goal 3 - Progress (Week 1): Not met OT Short Term Goal 4 (Week 1): Pt will complete toileting task with mod A. OT Short Term Goal 4 - Progress (Week 1): Met Week 2:  OT Short Term Goal 1 (Week 2): STG=LTG due to LOS  Skilled Therapeutic Interventions/Progress Updates:    Pt seen for OT session  focusing on functional standing balance and endurance. Pt in supine upon arrival, voicing increased fatigue, however, willing to attempt therapy session. She transferred to EOB with supervision and guarding of R LE when moving off EOB.  Pt took ~7 hops towards w/c with min A and verbal and tactile cues for RW management. Educated regarding sit <> stand technique and RW management during functional ambulation/ transfers.  In therapy gym, pt completed dynamic standing task at RW, required to reach to side to obtain bean bag and completed bean bag toss. She tolerated x4 trials of ~1 minute standing with min A at RW to complete task. During rest breaks, R LE propped on stool for support. She gathered bean bags from floor using reacher with VCs not to reach too far outside BOS in order to reduce risk of falling out of chair. Pt self propelled w/c ~56f back towards room at end of session for UE strengthening.  She ambulated ~572ffrom w/c to bed at end of session with min A. She returned to supine and left with all needs in reach and bed alarm on, Sister present.   Therapy Documentation Precautions:  Precautions Precautions: Fall Precaution Comments: HD MWF Required Braces or Orthoses: Knee Immobilizer - Right Knee Immobilizer - Right: On when out of bed or walking Restrictions Weight Bearing Restrictions: Yes RLE Weight Bearing: Non weight bearing Pain: Pain Assessment Pain Assessment: No/denies pain  See Function Navigator for Current Functional Status.   Therapy/Group: Individual Therapy  Lewis, Knoxx Boeding C 07/20/2015, 8:03 AM

## 2015-07-21 ENCOUNTER — Inpatient Hospital Stay (HOSPITAL_COMMUNITY): Payer: Medicare Other | Admitting: Occupational Therapy

## 2015-07-21 ENCOUNTER — Inpatient Hospital Stay (HOSPITAL_COMMUNITY): Payer: Medicare Other

## 2015-07-21 LAB — GLUCOSE, CAPILLARY
GLUCOSE-CAPILLARY: 104 mg/dL — AB (ref 65–99)
Glucose-Capillary: 132 mg/dL — ABNORMAL HIGH (ref 65–99)
Glucose-Capillary: 288 mg/dL — ABNORMAL HIGH (ref 65–99)
Glucose-Capillary: 259 mg/dL — ABNORMAL HIGH (ref 65–99)

## 2015-07-21 NOTE — Progress Notes (Signed)
Occupational Therapy Session Note  Patient Details  Name: [name] MRN: [id number] Date of Birth: [birth year]  Today's Date: 07/21/2015 OT Individual Time:  -   1300-1400  (60 min)      Short Term Goals: Week 1:  OT Short Term Goal 1 (Week 1): Pt will complete stand pivot transfer to [facility] with mod A OT Short Term Goal 1 - Progress (Week 1): Met OT Short Term Goal 2 (Week 1): Pt will carry over transfer technique of stand pivot transfer with min cues OT Short Term Goal 2 - Progress (Week 1): Met OT Short Term Goal 3 (Week 1): Pt will don pants using AE PRN with min A OT Short Term Goal 3 - Progress (Week 1): Not met OT Short Term Goal 4 (Week 1): Pt will complete toileting task with mod A. OT Short Term Goal 4 - Progress (Week 1): Met Week 2:  OT Short Term Goal 1 (Week 2): STG=LTG due to LOS  Skilled Therapeutic Interventions/Progress Updates:    Pt. Sitting in wc.  Applied footies with mod assist on right and min assist on  Left.    RN removed stables in RLE with pt's pain going to 10/10.  Pt donned foot rest rests with max assist.  Pt propelled wc on level surface for 50 feet and rested.  Unable to manage wc on inclines or declines.  Pt rolled wc another 50 feet and resting.  Returned to room and left with all needs in reach.    Therapy Documentation Precautions:  Precautions Precautions: Fall Precaution Comments: HD MWF Required Braces or Orthoses: Knee Immobilizer - Right Knee Immobilizer - Right: On when out of bed or walking Restrictions Weight Bearing Restrictions: Yes RLE Weight Bearing: Non weight bearing General:   Vital Signs: Therapy Vitals Temp: 97.8 F (36.6 C) Temp Source: Oral Pulse Rate: 63 Resp: 16 BP: 121/78 mmHg Patient Position (if appropriate): Sitting Oxygen Therapy SpO2: 100 % O2 Device: Not Delivered Pain:  9/10 with staple removed.  Pain pill administered.          :    See Function Navigator for Current Functional  Status.   Therapy/Group: Individual Therapy  Lisa Roca 07/21/2015, 4:50 PM

## 2015-07-21 NOTE — Progress Notes (Signed)
Physical Therapy Session Note  Patient Details  Name: [name] MRN: [id number] Date of Birth: Nov 28, 1928  Today's Date: 07/21/2015 PT Individual Time: 0900-1015 PT Individual Time Calculation (min): 75 min   Short Term Goals: Week 2:  PT Short Term Goal 1 (Week 2): = LTGs  Skilled Therapeutic Interventions/Progress Updates:    Initially pt declining mobility, dressing, or bathing. With encouragement and conversation, pt agreeable to wanting to go outside and therefore stating she wanted to get bathed and dressed. Focused on self care tasks for bathing and dressing EOB with set-up assist and some assist for clothing management and bathing certain body parts. Performed 3 reps of sit <> stands with min assist (once to pull up pants). Min A stand pivot to w/c with cues for placement of RW and upright posture. Cues for attention, problem solving, and sequencing during functional mobility tasks. Required min assist for supine <> sit with RLE assist by PT. Outside, pt propelled w/c in community environment with supervision to mod assist over uneven surfaces with cues for safety with hills. Returned to room and set up with all needs in reach.   Therapy Documentation Precautions:  Precautions Precautions: Fall Precaution Comments: HD MWF Required Braces or Orthoses: Knee Immobilizer - Right Knee Immobilizer - Right: On when out of bed or walking Restrictions Weight Bearing Restrictions: Yes RLE Weight Bearing: Non weight bearing   Pain:  Pain in RLE with mobility but denies any at rest.    See Function Navigator for Current Functional Status.   Therapy/Group: Individual Therapy  Canary Brim Ivory Broad, PT, DPT  07/21/2015, 10:51 AM

## 2015-07-21 NOTE — Progress Notes (Signed)
Occupational Therapy Session Note  Patient Details  Name: REMY BEEVERS MRN: [id number] Date of Birth: [birth year]  Today's Date: 07/21/2015 OT Individual Time: 1100-1200 OT Individual Time Calculation (min): 60 min    Short Term Goals: Week 2:  OT Short Term Goal 1 (Week 2): STG=LTG due to LOS  Skilled Therapeutic Interventions/Progress Updates:    Pt seen for OT tx session focusing on home education, LE positioning tolerance, and UE strengthening/ endurance. Pt sitting up in w/c upon arrival, agreeable to tx session. She self propelled w/c to ADL apartment with increased time, rest breaks, and supervision as pt would run into environmental obstacles in hallway. Pt educated and demonstration provided for tub transfer bench and technique for entering/exiting bench. Attempted to have pt complete transfer, however, when setting up w/c pt unable to tolerate having LE in non-dependent position. Worked to have pt lower LE slowly, however, was unable to tolerate it fully down in order to stand to complete transfer. Pt taken to therapy gym to complete UE strengthening exercises. During exercises, worked on pt tolerating having LE lowered slowly. She completed UE exercises using #2 weighted ball. Exercises completed x2 sets of 10 of overhead press, trunk rotations, and chest presses. Rest breaks provided throughout with VCs provided for deep breathing techniques. Stress ball also provided to pt to work on grip strengthening and pain management in thumb joint. At end of session, pt able to have LE lowered completely and completed sit <> stand at [facility] with mod A tolerating ~10 seconds of static standing before requiring seated rest break.  Pt returned to room at end of session, left sitting in w/c with all needs in reach. Discussed with pt w/c set-up and positioning during rest breaks btwn sessions and to not have LE fully elevated in order to decrease pain when going from supported position to non-supported  position in prep for standing task- pt voiced understanding.   Therapy Documentation Precautions:  Precautions Precautions: Fall Precaution Comments: HD MWF Required Braces or Orthoses: Knee Immobilizer - Right Knee Immobilizer - Right: On when out of bed or walking Restrictions Weight Bearing Restrictions: Yes RLE Weight Bearing: Non weight bearing  See Function Navigator for Current Functional Status.   Therapy/Group: Individual Therapy  Lewis, Shizuo Biskup C 07/21/2015, 7:18 AM

## 2015-07-21 NOTE — Progress Notes (Signed)
[facility] Progress Note  Assessment/Plan: 1. Right tib fx- s/p ORIF, working with PT 2. ESRD - HD MWF; records reviewed from outpt labs: see dialysis orders below  - Planning for outpatient dialysis in [place] while patient stays with her daughter after discharge.  3. Anemia - Hgb 9.5 - Aranesp 60 q Fri started 4/28 - generally hasn't been on ESA - hgb have been stable 9-11- dose venofer 100 x 3 due to pre surgical tsat of 22% 4. Secondary hyperparathyroidism - P controlled; on vit D and phoslo- corrected Ca high ~10.9 - d/c vit D and hold phoslo for now- if P goes up add non Ca binder; previously on Hectorol 8; suspect increase Ca due to alb drop- resume as able - normally on a 2.5 Ca bath 5. HTN/volume - controlled  6. Nutrition - renal carb mod diet/vits/nepro 7. Increase in LFT AST 77 4/26 - was normal 4/22 -   Renal Attending; Pt doing well with rehab.  Volume status and BP Ok.  Will arrange for HD today. Eman Morimoto C    Subjective:   No complaints this morning. Doing well. Will be getting HD today.  Objective Filed Vitals:   07/20/15 1300 07/20/15 2111 07/21/15 0500 07/21/15 0548  BP: 80/46 145/40  119/30  Pulse: 84   58  Temp: 98.6 F (37 C)   98.4 F (36.9 C)  TempSrc: Oral   Oral  Resp: 16   16  Height:      Weight:   131 lb 13.4 oz (59.8 kg)   SpO2: 98%   99%   Physical Exam General: alert sitting in chair, NAD Heart: RRR Lungs: CTA Abdomen: soft NT Extremities: right LE in immobilizer- left LE no sig edema Dialysis Access: right upper AVF + bruit  Dialysis Orders:  Center: [facility]  and [facility] on MWF . EDW 64.5 HD Bath 3 K 2.5Ca Time 3.5hrs Heparin none. Access RUE AVF BFR 350  DFR 600 Hectorol 8 no Fe no ESA Max UF goal per hour 1300  Recent monthly labs drawn 07/08/15  hgb 12.1 4/22 ferritin 1178 Fe 54 tsat 22% iPTH 767 Ca 9.3 corrected P 5.3, AST 31 and ALT 6 alk phos 328 ; vit D level 31 01/2015  Additional  Objective Labs: Basic Metabolic Panel:  Recent Labs Lab 07/17/15 1500 07/19/15 1501  NA 135 136  K 4.1 3.7  CL 94* 95*  CO2 25 26  GLUCOSE 222* 75  BUN 52* 33*  CREATININE 7.68* 6.56*  CALCIUM 9.3 9.2  PHOS 3.9  3.9 4.2   Liver Function Tests:  Recent Labs Lab 07/17/15 1500 07/19/15 1501  ALBUMIN 2.3* 2.4*   CBC:  Recent Labs Lab 07/16/15 0732 07/17/15 1500 07/19/15 1445  WBC 14.2* 12.5* 12.9*  NEUTROABS 9.3*  --   --   HGB 9.8* 9.5* 9.9*  HCT 30.3* 29.0* 29.6*  MCV 97.7 95.1 94.9  PLT 385 391 394  CBG:  Recent Labs Lab 07/20/15 0623 07/20/15 1131 07/20/15 1628 07/20/15 2041 07/21/15 0647  GLUCAP 184* 213* 235* 189* 132*   Medications:   . aspirin  325 mg Oral Daily  . darbepoetin (ARANESP) injection - DIALYSIS  60 mcg Intravenous Q Fri-HD  . diclofenac sodium  2 g Topical TID  . feeding supplement (GLUCERNA SHAKE)  237 mL Oral BID BM  . ferric gluconate (FERRLECIT/NULECIT) IV  125 mg Intravenous Q M,W,F-HD  . glimepiride  2 mg Oral Q breakfast  . heparin  5,000 Units Subcutaneous Q8H  . hydrALAZINE  50 mg Oral BID  . insulin aspart  0-24 Units Subcutaneous TID AC & HS  . isosorbide mononitrate  30 mg Oral Daily  . levothyroxine  75 mcg Oral QAC breakfast  . multivitamin  1 tablet Oral QHS  . nebivolol  10 mg Oral Daily  . pravastatin  20 mg Oral q1800  . vitamin E  400 Units Oral Daily

## 2015-07-21 NOTE — Progress Notes (Signed)
[place] PHYSICAL MEDICINE & REHABILITATION     PROGRESS NOTE    Subjective/Complaints: No new complaints. Rested well.      ROS: Pt denies fever, rash/itching, headache, blurred or double vision, nausea, vomiting, abdominal pain, diarrhea, chest pain, shortness of breath, palpitations, dysuria, dizziness, neck or back pain, bleeding, anxiety, or depression  Objective: Vital Signs: Blood pressure 119/30, pulse 58, temperature 98.4 F (36.9 C), temperature source Oral, resp. rate 16, height 5\' 1"  (1.549 m), weight 59.8 kg (131 lb 13.4 oz), SpO2 99 %. No results found.  Recent Labs  07/19/15 1445  WBC 12.9*  HGB 9.9*  HCT 29.6*  PLT 394    Recent Labs  07/19/15 1501  NA 136  K 3.7  CL 95*  GLUCOSE 75  BUN 33*  CREATININE 6.56*  CALCIUM 9.2   CBG (last 3)   Recent Labs  07/20/15 1628 07/20/15 2041 07/21/15 0647  GLUCAP 235* 189* 132*    Wt Readings from Last 3 Encounters:  07/21/15 59.8 kg (131 lb 13.4 oz)  07/11/15 66.225 kg (146 lb)  01/06/14 66.996 kg (147 lb 11.2 oz)    Physical Exam:  Constitutional: She is oriented to person, place, and time. She appears well-developed and well-nourished.  HENT:  Head: Normocephalic and atraumatic.  Eyes: Conjunctivae and EOM are normal.  Neck: Normal range of motion. Neck supple. No thyromegaly present.  Cardiovascular:  Irregular irregular RUE fistula  Respiratory: Effort normal and breath sounds normal. No respiratory distress.  GI: Soft. Bowel sounds are normal. She exhibits no distension.  Musculoskeletal: She exhibits edema and tenderness. Both first cmc's tender Neurological: She is alert and oriented to person, place, and time.  A&Ox2 DTRs symmetric Sensation intact to light touch Motor: B/l UE 4/5 proximal to distal LLE: 3 proximally, 4/5 distally RLE: hip flexion 2/5, wiggles toes  Skin:  Right leg wounds clean/dry with staples--1.5" area of eschar on patella (staples within).   Psychiatric:  She has a normal mood and affect. Her behavior is normal.     Assessment/Plan: 1. Functional deficits secondary to right tib/fib fx which require 3+ hours per day of interdisciplinary therapy in a comprehensive inpatient rehab setting. Physiatrist is providing close team supervision and 24 hour management of active medical problems listed below. Physiatrist and rehab team continue to assess barriers to discharge/monitor patient progress toward functional and medical goals.  Function:  Bathing Bathing position   Position:  (LB sitting on toilet - UB at sink)  Bathing parts Body parts bathed by patient: Right arm, Left arm, Chest, Abdomen, Front perineal area, Buttocks, Right upper leg, Left upper leg, Left lower leg Body parts bathed by helper: Right lower leg, Back  Bathing assist Assist Level: Touching or steadying assistance(Pt > 75%)      Upper Body Dressing/Undressing Upper body dressing   What is the patient wearing?: Pull over shirt/dress, Bra Bra - Perfomed by patient: Thread/unthread right bra strap, Thread/unthread left bra strap, Hook/unhook bra (pull down sports bra) Bra - Perfomed by helper: Hook/unhook bra (pull down sports bra) Pull over shirt/dress - Perfomed by patient: Thread/unthread right sleeve, Thread/unthread left sleeve, Put head through opening, Pull shirt over trunk Pull over shirt/dress - Perfomed by helper: Pull shirt over trunk        Upper body assist Assist Level: Set up, Supervision or verbal cues   Set up : To obtain clothing/put away  Lower Body Dressing/Undressing Lower body dressing   What is the patient wearing?: Underwear,  Pants, Non-skid slipper socks Underwear - Performed by patient: Thread/unthread right underwear leg, Thread/unthread left underwear leg, Pull underwear up/down Underwear - Performed by helper: Thread/unthread right underwear leg Pants- Performed by patient: Thread/unthread left pants leg, Pull pants up/down, Thread/unthread  right pants leg Pants- Performed by helper: Thread/unthread right pants leg Non-skid slipper socks- Performed by patient: Don/doff left sock Non-skid slipper socks- Performed by helper: Don/doff right sock Socks - Performed by patient: Don/doff left sock Socks - Performed by helper: Don/doff right sock Shoes - Performed by patient: Don/doff left shoe Shoes - Performed by helper: Fasten left, Don/doff right shoe          Lower body assist Assist for lower body dressing: Touching or steadying assistance (Pt > 75%)      Toileting Toileting   Toileting steps completed by patient: Adjust clothing prior to toileting, Performs perineal hygiene, Adjust clothing after toileting Toileting steps completed by helper: Adjust clothing after toileting, Adjust clothing prior to toileting Toileting Assistive Devices: Grab bar or rail  Toileting assist Assist level: Touching or steadying assistance (Pt.75%)   Transfers Chair/bed transfer   Chair/bed transfer method: Stand pivot Chair/bed transfer assist level: Touching or steadying assistance (Pt > 75%) Chair/bed transfer assistive device: Walker, Armrests, Orthosis     Locomotion Ambulation Ambulation activity did not occur: Safety/medical concerns (unsafe on eval)   Max distance: 10' Assist level: Touching or steadying assistance (Pt > 75%)   Wheelchair   Type: Manual Max wheelchair distance: 150' Assist Level: Supervision or verbal cues  Cognition Comprehension Comprehension assist level: Understands basic 75 - 89% of the time/ requires cueing 10 - 24% of the time  Expression Expression assist level: Expresses basic 75 - 89% of the time/requires cueing 10 - 24% of the time. Needs helper to occlude trach/needs to repeat words.  Social Interaction Social Interaction assist level: Interacts appropriately 75 - 89% of the time - Needs redirection for appropriate language or to initiate interaction.  Problem Solving Problem solving assist  level: Solves basic 75 - 89% of the time/requires cueing 10 - 24% of the time  Memory Memory assist level: Recognizes or recalls 75 - 89% of the time/requires cueing 10 - 24% of the time    Medical Problem List and Plan: 1. Limited functional mobility secondary to comminuted right tibia-fibula fracture. Status post ORIF 07/07/2015. Nonweightbearing. Knee immobilizer when out of bed. No ROM precautions  -continue CIR therapies--- 2. DVT Prophylaxis/Anticoagulation: Subcutaneous heparin. Monitor platelet counts and any signs of bleeding 3. Pain Management: tylenol only due to cogniitve side effects (family requested)  -added voltaren gel for bilateral hand OA 4. Acute blood loss anemia. hgb 9.6. Fe++ supp per nephrology 5. Neuropsych: This patient is capable of making decisions on her own behalf. At baseline 6. Skin/Wound Care: can change right leg dressing as needed  -remove staples today---will need to watch area of eschar 7. Fluids/Electrolytes/Nutrition: eating better 75-100% 8. End-stage renal disease with hemodialysis. HD each day after therapy day. 9. PAF. Aspirin 325 mg daily. Cardiac rate control 10. Hypertension. Hydralazine 50 mg twice a day, Imdur 30 mg daily 11. Diastolic congestive heart failure. Monitor for any signs of fluid overload 12. Diabetes mellitus. Checking blood sugars before meals and at bedtime.      -increased am amaryl to 2mg ---may need further titration.   -reduced SSI as she tends to rebound from rx of high or low cbg's.   CBG (last 3)   Recent Labs  07/20/15 1628 07/20/15  2041 07/21/15 0647  GLUCAP 235* 189* 132*   13. Hypothyroidism. Synthroid 75 g daily 14. Hyperlipidemia. Pravachol 15.  Leukocytosis but afebrile, no c/o except RLE pain, will monitor , incisions look ok.   CBC Latest Ref Rng 07/19/2015 07/17/2015 07/16/2015  WBC 4.0 - 10.5 K/uL 12.9(H) 12.5(H) 14.2(H)  Hemoglobin 12.0 - 15.0 g/dL 9.9(L) 9.5(L) 9.8(L)  Hematocrit 36.0 - 46.0 %  29.6(L) 29.0(L) 30.3(L)  Platelets 150 - 400 K/uL 394 391 385     LOS (Days) 9 A FACE TO FACE EVALUATION WAS PERFORMED  [name] T 07/21/2015 8:34 AM

## 2015-07-22 ENCOUNTER — Inpatient Hospital Stay (HOSPITAL_COMMUNITY): Payer: Medicare Other | Admitting: Occupational Therapy

## 2015-07-22 DIAGNOSIS — R7309 Other abnormal glucose: Secondary | ICD-10-CM | POA: Insufficient documentation

## 2015-07-22 LAB — GLUCOSE, CAPILLARY
GLUCOSE-CAPILLARY: 73 mg/dL (ref 65–99)
GLUCOSE-CAPILLARY: 157 mg/dL — AB (ref 65–99)
GLUCOSE-CAPILLARY: 177 mg/dL — AB (ref 65–99)
Glucose-Capillary: 230 mg/dL — ABNORMAL HIGH (ref 65–99)
Glucose-Capillary: 112 mg/dL — ABNORMAL HIGH (ref 65–99)

## 2015-07-22 MED ORDER — SODIUM CHLORIDE 0.9 % IV SOLN: 100.0000 mL | INTRAVENOUS | Status: DC | PRN

## 2015-07-22 MED ORDER — HEPARIN SODIUM (PORCINE) 1000 UNIT/ML DIALYSIS: 1000.0000 [IU] | INTRAMUSCULAR | Status: DC | PRN

## 2015-07-22 MED ORDER — LIDOCAINE-PRILOCAINE 2.5-2.5 % EX CREA: 1.0000 "application " | TOPICAL_CREAM | CUTANEOUS | Status: DC | PRN

## 2015-07-22 MED ORDER — PENTAFLUOROPROP-TETRAFLUOROETH EX AERO: 1.0000 "application " | INHALATION_SPRAY | CUTANEOUS | Status: DC | PRN

## 2015-07-22 MED ORDER — LIDOCAINE HCL (PF) 1 % IJ SOLN: 5.0000 mL | INTRAMUSCULAR | Status: DC | PRN

## 2015-07-22 MED ORDER — ALTEPLASE 2 MG IJ SOLR: 2.0000 mg | Freq: Once | INTRAMUSCULAR | Status: DC | PRN

## 2015-07-22 MED ORDER — ACETAMINOPHEN 325 MG PO TABS
ORAL_TABLET | ORAL | Status: AC
  Administered 2015-07-22: 650 mg via ORAL
  Filled 2015-07-22: qty 2

## 2015-07-22 MED ORDER — DARBEPOETIN ALFA 60 MCG/0.3ML IJ SOSY
PREFILLED_SYRINGE | INTRAMUSCULAR | Status: AC
  Administered 2015-07-22: 60 ug via INTRAVENOUS
  Filled 2015-07-22: qty 0.3

## 2015-07-22 MED ORDER — HEPARIN SODIUM (PORCINE) 1000 UNIT/ML DIALYSIS: 20.0000 [IU]/kg | INTRAMUSCULAR | Status: DC | PRN

## 2015-07-22 NOTE — Progress Notes (Signed)
[place] PHYSICAL MEDICINE & REHABILITATION     PROGRESS NOTE    Subjective/Complaints: Patient seen sitting in bed this morning. She is very pleasant.    ROS: Denies CP, SOB, nausea, vomiting, diarrhea.  Objective: Vital Signs: Blood pressure 101/67, pulse 66, temperature 97.8 F (36.6 C), temperature source Oral, resp. rate 18, height 5\' 1"  (1.549 m), weight 61.4 kg (135 lb 5.8 oz), SpO2 99 %. No results found.  Recent Labs  07/19/15 1445  WBC 12.9*  HGB 9.9*  HCT 29.6*  PLT 394    Recent Labs  07/19/15 1501  NA 136  K 3.7  CL 95*  GLUCOSE 75  BUN 33*  CREATININE 6.56*  CALCIUM 9.2   CBG (last 3)   Recent Labs  07/21/15 2048 07/22/15 0306 07/22/15 0647  GLUCAP 259* 73 157*    Wt Readings from Last 3 Encounters:  07/22/15 61.4 kg (135 lb 5.8 oz)  07/11/15 66.225 kg (146 lb)  01/06/14 66.996 kg (147 lb 11.2 oz)    Physical Exam:  Constitutional: She appears well-developed and well-nourished. NAD. HENT: Normocephalic and atraumatic.  Eyes: Conjunctivae and EOM are normal.  Cardiovascular: Irregular irregular. RUE fistula  Respiratory: Effort normal and breath sounds normal. No respiratory distress.  GI: Soft. Bowel sounds are normal. She exhibits no distension.  Musculoskeletal: She exhibits edema and tenderness. Both first cmc's tender Neurological: She is alert and oriented.  Motor: B/l UE 4/5 proximal to distal LLE: 3 proximally, 4/5 distally RLE: hip flexion 2/5, wiggles toes  Skin:  Right leg wounds clean/dry with Steri-Strips, area of eschar on patella with dressing C/D/I.   Psychiatric: She has a normal mood and affect. Her behavior is normal.     Assessment/Plan: 1. Functional deficits secondary to right tib/fib fx which require 3+ hours per day of interdisciplinary therapy in a comprehensive inpatient rehab setting. Physiatrist is providing close team supervision and 24 hour management of active medical problems listed  below. Physiatrist and rehab team continue to assess barriers to discharge/monitor patient progress toward functional and medical goals.  Function:  Bathing Bathing position   Position: Sitting EOB (bottom in supine)  Bathing parts Body parts bathed by patient: Right arm, Left arm, Chest, Abdomen, Front perineal area, Buttocks Body parts bathed by helper: Back  Bathing assist Assist Level: Touching or steadying assistance(Pt > 75%)      Upper Body Dressing/Undressing Upper body dressing   What is the patient wearing?: Pull over shirt/dress, Bra Bra - Perfomed by patient: Thread/unthread right bra strap, Thread/unthread left bra strap, Hook/unhook bra (pull down sports bra) Bra - Perfomed by helper: Hook/unhook bra (pull down sports bra) Pull over shirt/dress - Perfomed by patient: Thread/unthread right sleeve, Thread/unthread left sleeve, Put head through opening, Pull shirt over trunk Pull over shirt/dress - Perfomed by helper: Pull shirt over trunk        Upper body assist Assist Level: Set up, Supervision or verbal cues   Set up : To obtain clothing/put away  Lower Body Dressing/Undressing Lower body dressing   What is the patient wearing?: Underwear, Pants Underwear - Performed by patient: Thread/unthread right underwear leg, Thread/unthread left underwear leg Underwear - Performed by helper: Pull underwear up/down Pants- Performed by patient: Thread/unthread left pants leg Pants- Performed by helper: Thread/unthread right pants leg, Pull pants up/down Non-skid slipper socks- Performed by patient: Don/doff left sock Non-skid slipper socks- Performed by helper: Don/doff right sock Socks - Performed by patient: Don/doff left sock Socks - Performed  by helper: Don/doff right sock Shoes - Performed by patient: Don/doff left shoe Shoes - Performed by helper: Fasten left, Don/doff right shoe          Lower body assist Assist for lower body dressing: Touching or steadying  assistance (Pt > 75%)      Toileting Toileting Toileting activity did not occur: N/A Toileting steps completed by patient: Adjust clothing prior to toileting, Performs perineal hygiene, Adjust clothing after toileting Toileting steps completed by helper: Adjust clothing after toileting, Adjust clothing prior to toileting Toileting Assistive Devices: Grab bar or rail  Toileting assist Assist level: Touching or steadying assistance (Pt.75%)   Transfers Chair/bed transfer   Chair/bed transfer method: Stand pivot Chair/bed transfer assist level: Touching or steadying assistance (Pt > 75%) Chair/bed transfer assistive device: Walker, Armrests, Orthosis     Locomotion Ambulation Ambulation activity did not occur: Safety/medical concerns (unsafe on eval)   Max distance: 10' Assist level: Touching or steadying assistance (Pt > 75%)   Wheelchair   Type: Manual Max wheelchair distance: 100' Assist Level: Touching or steadying assistance (Pt > 75%)  Cognition Comprehension Comprehension assist level: Understands basic 75 - 89% of the time/ requires cueing 10 - 24% of the time  Expression Expression assist level: Expresses basic 75 - 89% of the time/requires cueing 10 - 24% of the time. Needs helper to occlude trach/needs to repeat words.  Social Interaction Social Interaction assist level: Interacts appropriately 90% of the time - Needs monitoring or encouragement for participation or interaction.  Problem Solving Problem solving assist level: Solves basic problems with no assist  Memory Memory assist level: Recognizes or recalls 90% of the time/requires cueing < 10% of the time    Medical Problem List and Plan: 1. Limited functional mobility secondary to comminuted right tibia-fibula fracture. Status post ORIF 07/07/2015. Nonweightbearing. Knee immobilizer when out of bed. No ROM precautions  -continue CIR therapies 2. DVT Prophylaxis/Anticoagulation: Subcutaneous heparin. Monitor  platelet counts and any signs of bleeding 3. Pain Management: tylenol only due to cognitve side effects (family requested)  -added voltaren gel for bilateral hand OA 4. Acute blood loss anemia. hgb 9.9. Fe++ supp per nephrology 5. Neuropsych: This patient is capable of making decisions on her own behalf. At baseline 6. Skin/Wound Care: can change right leg dressing as needed  -will need to watch area of eschar 7. Fluids/Electrolytes/Nutrition:  8. End-stage renal disease with hemodialysis. HD after therapy. 9. PAF. Aspirin 325 mg daily. Cardiac rate control 10. Hypertension. Hydralazine 50 mg twice a day, Imdur 30 mg daily 11. Diastolic congestive heart failure. Monitor for any signs of fluid overload 12. Diabetes mellitus. Checking blood sugars before meals and at bedtime.      -increased am amaryl to 2mg ---may need further titration.   -reduced SSI as she tends to rebound from rx of high or low cbg's.    -Remains labile, will continue to monitor CBG (last 3)   Recent Labs  07/21/15 2048 07/22/15 0306 07/22/15 0647  GLUCAP 259* 73 157*   13. Hypothyroidism. Synthroid 75 g daily 14. Hyperlipidemia. Pravachol 15.  Leukocytosis but afebrile, no c/o except RLE pain, will monitor , incisions look ok.   CBC Latest Ref Rng 07/19/2015 07/17/2015 07/16/2015  WBC 4.0 - 10.5 K/uL 12.9(H) 12.5(H) 14.2(H)  Hemoglobin 12.0 - 15.0 g/dL 9.9(L) 9.5(L) 9.8(L)  Hematocrit 36.0 - 46.0 % 29.6(L) 29.0(L) 30.3(L)  Platelets 150 - 400 K/uL 394 391 385     LOS (Days) 10 A FACE  TO FACE EVALUATION WAS PERFORMED  Siya Flurry Lorie Phenix 07/22/2015 8:17 AM

## 2015-07-22 NOTE — Progress Notes (Signed)
Lab to pt's room to draw renal panel lab. Pt have BUE restriction due to HD cath in right arm, and prior L mastectomy. Lab questioning if renal panel can be drawn from hand. Spoke to nurse in HD, lab is usually drawn during HD. Contacted Dr. Jonnie Finner, on call physician for [facility]. Per Dr. Jonnie Finner, okay to have renal panel drawn on 07/24/15. Order for renal panel to be d/c for today, and reentered for Monday to be drawn during HD.

## 2015-07-22 NOTE — Plan of Care (Signed)
Problem: RH PAIN MANAGEMENT Goal: RH STG PAIN MANAGED AT OR BELOW PT'S PAIN GOAL <5  Outcome: Progressing No c/o pain     

## 2015-07-22 NOTE — Progress Notes (Signed)
Occupational Therapy Session Note  Patient Details  Name: [name] MRN: [id number] Date of Birth: September 30, 1928  Today's Date: 07/22/2015 OT Individual Time: 1000-1045 OT Individual Time Calculation (min): 45 min    Short Term Goals: Week 2:  OT Short Term Goal 1 (Week 2): STG=LTG due to LOS  Skilled Therapeutic Interventions/Progress Updates:    Upon entering the room, pt supine in bed with 5/10 c/o pain in R knee but reports recent medication given. Pt agreeable to OT intervention. Pt performed supine >sit with min A for R LE. R knee immobilizer donned by therapist once seated on EOB. Pt transferred from bed >wheelchair with stand pivot and use of RW with min A. Pt verbalizing need for BM and propelled wheelchair to bathroom for mod A stand pivot transfer onto elevated toilet. While seated on toilet, pt engaged in bathing and dressing tasks for time management. Pt maintaining NWB status during each transfer this session without cues to do so. Pt standing with mod A for balance while pulling clothing over B hips. Pt returning to wheelchair at end of session in same manner as above. Pt remained in wheelchair with call bell and all needed items within reach upon exiting the room.   Therapy Documentation Precautions:  Precautions Precautions: Fall Precaution Comments: HD MWF Required Braces or Orthoses: Knee Immobilizer - Right Knee Immobilizer - Right: On when out of bed or walking Restrictions Weight Bearing Restrictions: Yes RLE Weight Bearing: Non weight bearing  See Function Navigator for Current Functional Status.   Therapy/Group: Individual Therapy  Phineas Semen 07/22/2015, 10:45 AM

## 2015-07-22 NOTE — Progress Notes (Signed)
[facility] Progress Note  Assessment/Plan: 1. Right tib fx- s/p ORIF, working with PT 2. ESRD - HD MWF; records reviewed from outpt labs: see dialysis orders below  - Planning for outpatient dialysis in [place] while patient stays with her daughter after discharge.  3. Anemia - Hgb 9.5 - Aranesp 60 q Fri started 4/28 - generally hasn't been on ESA - hgb have been stable 9-11- dose venofer 100 x 3 due to pre surgical tsat of 22% 4. Secondary hyperparathyroidism - P controlled; on vit D and phoslo- corrected Ca high ~10.9 - d/c vit D and hold phoslo for now- if P goes up add non Ca binder; previously on Hectorol 8; suspect increase Ca due to alb drop- resume as able - normally on a 2.5 Ca bath 5. HTN/volume - controlled  6. Nutrition - renal carb mod diet/vits/nepro  Renal Attending: Had HD yesterday.  Tol therapies. Will need to confirm OP status for HD. Rito Lecomte C   Subjective:  Tolerated HD well last night. No complaints this morning.   Objective Filed Vitals:   07/22/15 0200 07/22/15 0230 07/22/15 0300 07/22/15 0538  BP: _0 101/67  Pulse: 80 78 62 66  Temp:   97.7 F (36.5 C) 97.8 F (36.6 C)  TempSrc:   Oral Oral  Resp: _1 Height:      Weight:    135 lb 5.8 oz (61.4 kg)  SpO2:    99%   Physical Exam General: alert sitting in chair, NAD Heart: RRR Lungs: CTA Abdomen: soft NT Extremities: right LE in immobilizer- left LE no sig edema Dialysis Access: right upper AVF + bruit  Dialysis Orders:  Center: [facility] on MWF . EDW 64.5 HD Bath 3 K 2.5Ca Time 3.5hrs Heparin none. Access RUE AVF BFR 350  DFR 600 Hectorol 8 no Fe no ESA Max UF goal per hour 1300  Recent monthly labs drawn 07/08/15  hgb 12.1 4/22 ferritin 1178 Fe 54 tsat 22% iPTH 767 Ca 9.3 corrected P 5.3, AST 31 and ALT 6 alk phos 328 ; vit D level 31 01/2015  Additional Objective Labs: Basic Metabolic Panel:  Recent Labs Lab  07/17/15 1500 07/19/15 1501  NA 135 136  K 4.1 3.7  CL 94* 95*  CO2 25 26  GLUCOSE 222* 75  BUN 52* 33*  CREATININE 7.68* 6.56*  CALCIUM 9.3 9.2  PHOS 3.9  3.9 4.2   Liver Function Tests:  Recent Labs Lab 07/17/15 1500 07/19/15 1501  ALBUMIN 2.3* 2.4*   CBC:  Recent Labs Lab 07/16/15 0732 07/17/15 1500 07/19/15 1445  WBC 14.2* 12.5* 12.9*  NEUTROABS 9.3*  --   --   HGB 9.8* 9.5* 9.9*  HCT 30.3* 29.0* 29.6*  MCV 97.7 95.1 94.9  PLT 385 391 394  CBG:  Recent Labs Lab 07/21/15 1215 07/21/15 1715 07/21/15 2048 07/22/15 0306 07/22/15 0647  GLUCAP 288* 104* 259* 73 157*   Medications:   . aspirin  325 mg Oral Daily  . darbepoetin (ARANESP) injection - DIALYSIS  60 mcg Intravenous Q Fri-HD  . diclofenac sodium  2 g Topical TID  . feeding supplement (GLUCERNA SHAKE)  237 mL Oral BID BM  . glimepiride  2 mg Oral Q breakfast  . heparin  5,000 Units Subcutaneous Q8H  . hydrALAZINE  50 mg Oral BID  . insulin aspart  0-24 Units Subcutaneous TID AC & HS  . isosorbide mononitrate  30  mg Oral Daily  . levothyroxine  75 mcg Oral QAC breakfast  . multivitamin  1 tablet Oral QHS  . nebivolol  10 mg Oral Daily  . pravastatin  20 mg Oral q1800  . vitamin E  400 Units Oral Daily   Caleb M. Jerline Pain, [place] Resident PGY-2 07/22/2015 9:01 AM

## 2015-07-23 LAB — GLUCOSE, CAPILLARY
GLUCOSE-CAPILLARY: 97 mg/dL (ref 65–99)
Glucose-Capillary: 122 mg/dL — ABNORMAL HIGH (ref 65–99)
Glucose-Capillary: 187 mg/dL — ABNORMAL HIGH (ref 65–99)
Glucose-Capillary: 248 mg/dL — ABNORMAL HIGH (ref 65–99)

## 2015-07-23 NOTE — Progress Notes (Signed)
[place] PHYSICAL MEDICINE & REHABILITATION     PROGRESS NOTE    Subjective/Complaints: Patient seen lying in bed this morning. She is pleasant and states that she is doing well.   ROS: Denies CP, SOB, nausea, vomiting, diarrhea.  Objective: Vital Signs: Blood pressure 107/90, pulse 65, temperature 98.7 F (37.1 C), temperature source Oral, resp. rate 18, height 5\' 1"  (1.549 m), weight 61.825 kg (136 lb 4.8 oz), SpO2 97 %. No results found. No results for input(s): WBC, HGB, HCT, PLT in the last 72 hours. No results for input(s): NA, K, CL, GLUCOSE, BUN, CREATININE, CALCIUM in the last 72 hours.  Invalid input(s): CO CBG (last 3)   Recent Labs  07/22/15 1657 07/22/15 2106 07/23/15 0650  GLUCAP 112* 177* 122*    Wt Readings from Last 3 Encounters:  07/23/15 61.825 kg (136 lb 4.8 oz)  07/11/15 66.225 kg (146 lb)  01/06/14 66.996 kg (147 lb 11.2 oz)    Physical Exam:  Constitutional: She appears well-developed and well-nourished. NAD. HENT: Normocephalic and atraumatic.  Eyes: Conjunctivae and EOM are normal.  Cardiovascular: Irregular irregular. RUE fistula  Respiratory: Effort normal and breath sounds normal. No respiratory distress.  GI: Soft. Bowel sounds are normal. She exhibits no distension.  Musculoskeletal: She exhibits edema and tenderness. Both first cmc's tender Neurological: She is alert and oriented.  Motor: B/l UE 4/5 proximal to distal LLE: 3 proximally, 4/5 distally RLE: hip flexion 2/5, wiggles toes  Skin:  Right leg wounds clean/dry with Steri-Strips, area of eschar on patella Appears to be healing with pink granulation tissue around the edges.   Psychiatric: She has a normal mood and affect. Her behavior is normal.     Assessment/Plan: 1. Functional deficits secondary to right tib/fib fx which require 3+ hours per day of interdisciplinary therapy in a comprehensive inpatient rehab setting. Physiatrist is providing close team supervision and  24 hour management of active medical problems listed below. Physiatrist and rehab team continue to assess barriers to discharge/monitor patient progress toward functional and medical goals.  Function:  Bathing Bathing position   Position: Other (comment) (sitting on commode)  Bathing parts Body parts bathed by patient: Right arm, Left arm, Chest, Abdomen, Front perineal area, Buttocks, Right upper leg, Left lower leg, Left upper leg Body parts bathed by helper: Back, Right lower leg  Bathing assist Assist Level: Touching or steadying assistance(Pt > 75%)      Upper Body Dressing/Undressing Upper body dressing   What is the patient wearing?: Pull over shirt/dress, Bra Bra - Perfomed by patient: Thread/unthread right bra strap, Thread/unthread left bra strap Bra - Perfomed by helper: Hook/unhook bra (pull down sports bra) Pull over shirt/dress - Perfomed by patient: Thread/unthread right sleeve, Thread/unthread left sleeve, Put head through opening, Pull shirt over trunk Pull over shirt/dress - Perfomed by helper: Pull shirt over trunk        Upper body assist Assist Level: Touching or steadying assistance(Pt > 75%)   Set up : To obtain clothing/put away  Lower Body Dressing/Undressing Lower body dressing   What is the patient wearing?: Underwear, Pants Underwear - Performed by patient: Thread/unthread right underwear leg, Thread/unthread left underwear leg Underwear - Performed by helper: Pull underwear up/down Pants- Performed by patient: Thread/unthread left pants leg Pants- Performed by helper: Thread/unthread right pants leg, Pull pants up/down Non-skid slipper socks- Performed by patient: Don/doff left sock Non-skid slipper socks- Performed by helper: Don/doff right sock, Don/doff left sock Socks - Performed by patient:  Don/doff left sock Socks - Performed by helper: Don/doff right sock Shoes - Performed by patient: Don/doff left shoe Shoes - Performed by helper: Fasten  left, Don/doff right shoe          Lower body assist Assist for lower body dressing:  (mod a)      Toileting Toileting Toileting activity did not occur: N/A Toileting steps completed by patient: Adjust clothing prior to toileting, Performs perineal hygiene, Adjust clothing after toileting Toileting steps completed by helper: Adjust clothing after toileting, Adjust clothing prior to toileting Toileting Assistive Devices: Grab bar or rail  Toileting assist Assist level: Touching or steadying assistance (Pt.75%)   Transfers Chair/bed transfer   Chair/bed transfer method: Stand pivot Chair/bed transfer assist level: Touching or steadying assistance (Pt > 75%) Chair/bed transfer assistive device: Walker, Armrests, Orthosis     Locomotion Ambulation Ambulation activity did not occur: Safety/medical concerns (unsafe on eval)   Max distance: 10' Assist level: Touching or steadying assistance (Pt > 75%)   Wheelchair   Type: Manual Max wheelchair distance: 100' Assist Level: Touching or steadying assistance (Pt > 75%)  Cognition Comprehension Comprehension assist level: Understands basic 75 - 89% of the time/ requires cueing 10 - 24% of the time  Expression Expression assist level: Expresses basic 75 - 89% of the time/requires cueing 10 - 24% of the time. Needs helper to occlude trach/needs to repeat words.  Social Interaction Social Interaction assist level: Interacts appropriately 90% of the time - Needs monitoring or encouragement for participation or interaction.  Problem Solving Problem solving assist level: Solves basic problems with no assist  Memory Memory assist level: Recognizes or recalls 90% of the time/requires cueing < 10% of the time    Medical Problem List and Plan: 1. Limited functional mobility secondary to comminuted right tibia-fibula fracture. Status post ORIF 07/07/2015. Nonweightbearing. Knee immobilizer when out of bed. No ROM precautions  -continue CIR  therapies 2. DVT Prophylaxis/Anticoagulation: Subcutaneous heparin. Monitor platelet counts and any signs of bleeding 3. Pain Management: tylenol only due to cognitve side effects (family requested)  -added voltaren gel for bilateral hand OA 4. Acute blood loss anemia. hgb 9.9. Fe++ supp per nephrology 5. Neuropsych: This patient is capable of making decisions on her own behalf. At baseline 6. Skin/Wound Care: can change right leg dressing as needed  -will need to watch area of eschar, appears to be healing 7. Fluids/Electrolytes/Nutrition:  8. End-stage renal disease with hemodialysis. HD after therapy. 9. PAF. Aspirin 325 mg daily. Cardiac rate control 10. Hypertension. Hydralazine 50 mg twice a day, Imdur 30 mg daily 11. Diastolic congestive heart failure. Monitor for any signs of fluid overload 12. Diabetes mellitus. Checking blood sugars before meals and at bedtime.      -increased am amaryl to 2mg ---may need further titration.   -reduced SSI as she tends to rebound from rx of high or low cbg's.    -Was labile,? Stabilizing Will continue to monitor CBG (last 3)   Recent Labs  07/22/15 1657 07/22/15 2106 07/23/15 0650  GLUCAP 112* 177* 122*   13. Hypothyroidism. Synthroid 75 g daily 14. Hyperlipidemia. Pravachol 15.  Leukocytosis but afebrile, no c/o except RLE pain, will monitor , incisions look ok.   CBC Latest Ref Rng 07/19/2015 07/17/2015 07/16/2015  WBC 4.0 - 10.5 K/uL 12.9(H) 12.5(H) 14.2(H)  Hemoglobin 12.0 - 15.0 g/dL 9.9(L) 9.5(L) 9.8(L)  Hematocrit 36.0 - 46.0 % 29.6(L) 29.0(L) 30.3(L)  Platelets 150 - 400 K/uL 394 391 385  LOS (Days) 11 A FACE TO FACE EVALUATION WAS PERFORMED  [name] Lorie Phenix 07/23/2015 7:18 AM

## 2015-07-23 NOTE — Progress Notes (Signed)
Assessment/Plan: 1. Right tib fx- s/p ORIF, working with PT 2. ESRD - HD MWF - Planning for outpatient dialysis in [place] while patient stays with her daughter after discharge.   Subjective: Interval History: Tol rehab  Objective: Vital signs in last 24 hours: Temp:  [97.5 F (36.4 C)-98.7 F (37.1 C)] 97.5 F (36.4 C) (05/07 1435) Pulse Rate:  [57-65] 57 (05/07 1435) Resp:  [18] 18 (05/07 1435) BP: (107-139)/(32-90) 124/46 mmHg (05/07 1435) SpO2:  [97 %-100 %] 100 % (05/07 1435) Weight:  [61.825 kg (136 lb 4.8 oz)] 61.825 kg (136 lb 4.8 oz) (05/07 0522) Weight change: -0.575 kg (-1 lb 4.3 oz)  Intake/Output from previous day: 05/06 0701 - 05/07 0700 In: 600 [P.O.:600] Out: 1 [Stool:1] Intake/Output this shift: Total I/O In: 240 [P.O.:240] Out: -   General appearance: alert and cooperative Resp: clear to auscultation bilaterally Cardio: regular rate and rhythm, S1, S2 normal, no murmur, click, rub or gallop Extremities: right leg in a splint  Lab Results: No results for input(s): WBC, HGB, HCT, PLT in the last 72 hours. BMET: No results for input(s): NA, K, CL, CO2, GLUCOSE, BUN, CREATININE, CALCIUM in the last 72 hours. No results for input(s): PTH in the last 72 hours. Iron Studies: No results for input(s): IRON, TIBC, TRANSFERRIN, FERRITIN in the last 72 hours. Studies/Results: No results found.  Scheduled: . aspirin  325 mg Oral Daily  . darbepoetin (ARANESP) injection - DIALYSIS  60 mcg Intravenous Q Fri-HD  . diclofenac sodium  2 g Topical TID  . feeding supplement (GLUCERNA SHAKE)  237 mL Oral BID BM  . glimepiride  2 mg Oral Q breakfast  . heparin  5,000 Units Subcutaneous Q8H  . hydrALAZINE  50 mg Oral BID  . insulin aspart  0-24 Units Subcutaneous TID AC & HS  . isosorbide mononitrate  30 mg Oral Daily  . levothyroxine  75 mcg Oral QAC breakfast  . multivitamin  1 tablet Oral QHS  . nebivolol  10 mg Oral Daily  . pravastatin  20 mg  Oral q1800  . vitamin E  400 Units Oral Daily    LOS: 11 days   [name] C 07/23/2015,3:31 PM

## 2015-07-24 ENCOUNTER — Inpatient Hospital Stay (HOSPITAL_COMMUNITY): Payer: Medicare Other

## 2015-07-24 ENCOUNTER — Inpatient Hospital Stay (HOSPITAL_COMMUNITY): Payer: Medicare Other | Admitting: Physical Therapy

## 2015-07-24 ENCOUNTER — Inpatient Hospital Stay (HOSPITAL_COMMUNITY): Payer: Medicare Other | Admitting: Occupational Therapy

## 2015-07-24 LAB — GLUCOSE, CAPILLARY
GLUCOSE-CAPILLARY: 86 mg/dL (ref 65–99)
GLUCOSE-CAPILLARY: 200 mg/dL — AB (ref 65–99)
Glucose-Capillary: 97 mg/dL (ref 65–99)
Glucose-Capillary: 219 mg/dL — ABNORMAL HIGH (ref 65–99)

## 2015-07-24 NOTE — Progress Notes (Signed)
0500 complained of left heel burning. Foam dressing in place-Unstageable area noted to achilles area. Removes prevalon boot. Elevating heel off pillow, but patient moves independently in bed, difficult to keep elevated. Spoke with patient about keeping heel off all surfaces. "okay" [name] A

## 2015-07-24 NOTE — Plan of Care (Signed)
Problem: RH Wheelchair Mobility Goal: LTG Patient will propel w/c in controlled environment (PT) LTG: Patient will propel wheelchair in controlled environment, # of feet with assist (PT)  Downgraded due to need for cues. Goal: LTG Patient will propel w/c in home environment (PT) LTG: Patient will propel wheelchair in home environment, # of feet with assistance (PT).  Downgraded due to cues needed ABG

## 2015-07-24 NOTE — Plan of Care (Signed)
Problem: RH Bed Mobility Goal: LTG Patient will perform bed mobility with assist (PT) LTG: Patient will perform bed mobility with assistance, with/without cues (PT).  Downgraded due to need for RLE support ABG  Problem: RH Bed to Chair Transfers Goal: LTG Patient will perform bed/chair transfers w/assist (PT) LTG: Patient will perform bed/chair transfers with assistance, with/without cues (PT).  Downgraded ABG

## 2015-07-24 NOTE — Progress Notes (Signed)
Occupational Therapy Session Note  Patient Details  Name: [name] MRN: [id number] Date of Birth: March 06, 1929  Today's Date: 07/24/2015 OT Individual Time: 0930-1100 OT Individual Time Calculation (min): 90 min    Short Term Goals: Week 2:  OT Short Term Goal 1 (Week 2): STG=LTG due to LOS  Skilled Therapeutic Interventions/Progress Updates:    Pt seen for OT ADL bathing/dressing session. Pt in supine upon arrival, agreeable to tx session and voicing need for toileting task. She transferred to EOB with guarding assist for R LE. She ambulated ~67ft to w/c with RW and min A. She then ambulated ~3 ft into bathroom with RW requiring increased cues for RW management and positioning once becoming fatigue with VCs for hand placement during stand > sit.  Lateral leans completed on toilet for hygiene. She returned to chair in same manner as described above requiring mod-max A to sit in w/c once fatigued.   She completed bathing/ dressing at w/c level at sink standing with RW to pull pants up, requiring assist to pull pants up completely once fatigued. Therapist assisted with donning/doffing knee immobilizer throughout session.  She self propelled w/c to ADL apartment where she completed simulated tub/shower transfer. Following demonstration, pt completed transfer to tub transfer bench and into shower with min A.  She returned to room at end of session, left sitting up in w/c with all needs in reach.  VCs, education, and practice completed throughout session for w/c parts management including elevating leg rests and brakes.   Therapy Documentation Precautions:  Precautions Precautions: Fall Precaution Comments: HD MWF Required Braces or Orthoses: Knee Immobilizer - Right Knee Immobilizer - Right: On when out of bed or walking Restrictions Weight Bearing Restrictions: Yes RLE Weight Bearing: Non weight bearing Pain: Pain Assessment Pain Assessment: 0-10 Pain Score: No/denis  pain   Therapy/Group: Individual Therapy  Lewis, Mandell Pangborn C 07/24/2015, 7:16 AM

## 2015-07-24 NOTE — Progress Notes (Signed)
Physical Therapy Session Note  Patient Details  Name: [name] MRN: [id number] Date of Birth: 03-Aug-1928  Today's Date: 07/24/2015 PT Individual Time: 1304-1402 PT Individual Time Calculation (min): 58 min   Short Term Goals: Week 2:  PT Short Term Goal 1 (Week 2): = LTGs  Skilled Therapeutic Interventions/Progress Updates:   Pt received in w/c with KI donned but reporting feeling like it is too tight.  Re-adjusted KI and ice pack placed under KI for edema and pain management.  Pt performed w/c mobility x 100' in controlled environment with extra time and rest breaks and verbal cues for sequencing.  In gym continued w/c mobility training with w/c propulsion up/down ramp x 2 reps with min-mod A and verbal cues for safe negotiation.  Pt reports that ramp has been installed at her daughter's house-will need to confirm with daughter.  In apartment performed transfer training w/c <> regular low bed x 2 reps with pt performing lateral scooting with slideboard with min A for RLE management and intermittent facilitation of forward trunk lean to maintain balance and weight shift during lateral scooting due to pt tendency to lean posterior before transfer complete-one episode of pt lying completely back on bed with buttocks still on slideboard and RLE in w/c.  Pt educated on safe sequence and repeated transfer with improved safety and technique.  Performed supine <> sit with KI and min A for RLE.  At end of session pt returned to room and transferred w/c > recliner with slideboard and min A.  Pt left in recliner with LE elevated to rest before next PT session.  Therapy Documentation Precautions:  Precautions Precautions: Fall Precaution Comments: HD MWF Required Braces or Orthoses: Knee Immobilizer - Right Knee Immobilizer - Right: On when out of bed or walking Restrictions Weight Bearing Restrictions: Yes RLE Weight Bearing: Non weight bearing Vital Signs: Therapy Vitals Temp: 97.9 F (36.6  C) Temp Source: Oral Pulse Rate: (!) 55 Resp: 16 BP: (!) 131/40 mmHg Patient Position (if appropriate): Sitting Oxygen Therapy SpO2: 99 % O2 Device: Not Delivered Pain:  No c/o pain but reports feeling like KI too tight on RLE.  Re-adjusted KI and ice pack placed for edema management.  See Function Navigator for Current Functional Status.   Therapy/Group: Individual Therapy  Raylene Everts [place] 07/24/2015, 4:44 PM

## 2015-07-24 NOTE — Progress Notes (Signed)
[facility] Rounding Note  Subjective: Doing well. No complaints.  Says she will be staying in [place] with her daughter for at least a couple of months after discharge from [place] (which she thinks will be on Friday)  Objective: Vital signs in last 24 hours: Temp:  [97.5 F (36.4 C)-97.9 F (36.6 C)] 97.9 F (36.6 C) (05/08 0507) Pulse Rate:  [55-60] 57 (05/08 0759) Resp:  [18] 18 (05/08 0507) BP: (104-124)/(41-57) 119/41 mmHg (05/08 0759) SpO2:  [100 %] 100 % (05/08 0507) Weight:  [135 lb 2.3 oz (61.3 kg)] 135 lb 2.3 oz (61.3 kg) (05/08 0507) Weight change: -1 lb 2.5 oz (-0.525 kg)  General: alert sitting in chair, NAD Heart: S1S2 No S3 Lungs: CTA Abdomen: soft NT Extremities: No edema of LLE. RLE in immobilizer, with trace pedal edema Dialysis Access: right upper AVF + bruit. Old non-functional AVG R forearm   Scheduled Medications: . aspirin  325 mg Oral Daily  . darbepoetin (ARANESP) injection - DIALYSIS  60 mcg Intravenous Q Fri-HD  . diclofenac sodium  2 g Topical TID  . feeding supplement (GLUCERNA SHAKE)  237 mL Oral BID BM  . glimepiride  2 mg Oral Q breakfast  . heparin  5,000 Units Subcutaneous Q8H  . hydrALAZINE  50 mg Oral BID  . insulin aspart  0-24 Units Subcutaneous TID AC & HS  . isosorbide mononitrate  30 mg Oral Daily  . levothyroxine  75 mcg Oral QAC breakfast  . multivitamin  1 tablet Oral QHS  . nebivolol  10 mg Oral Daily  . pravastatin  20 mg Oral q1800  . vitamin E  400 Units Oral Daily   Assessment/Plan:  1. Right tib fx- s/p ORIF (07/07/15), working with PT 2. ESRD - HD MWF.  Has spot for outpatient HD while staying in [place] after discharge.  3. Anemia - Hgb 9.5 - Aranesp 60 q Fri started 4/28 - generally hasn't been on ESA - hgb have been stable 9-11- dose venofer 100 x 3 (actually had ferric gluconate 125 X 3 doses with HD, last dose 5/6) due to pre surgical tsat of 22% 4. HTN/volume - controlled  5. Nutrition - renal carb mod  diet/vits/nepro 6. DM2 7. PAF - on ASA only 8.   LOS: 12 days   Dimas Chyle 07/24/2015,8:31 AM  I have seen and examined this patient and agree with plan and assessment with highlighted additions. She will be discharged (likely on Friday) to her daughter's home, she thinks for at least 2 months. She has an outpt HD spot at [facility] MWF 2nd shift, to arrive at 12:30 PM at the time of her first treatment there.  Zameria Vogl B,MD 07/24/2015 8:46 PM

## 2015-07-24 NOTE — Progress Notes (Signed)
PHYSICAL MEDICINE & REHABILITATION     PROGRESS NOTE    Subjective/Complaints: No new complaints. Asked how her right heel was doing. Pain controlled.    ROS: Denies CP, SOB, nausea, vomiting, diarrhea.  Objective: Vital Signs: Blood pressure 119/41, pulse 57, temperature 97.9 F (36.6 C), temperature source Oral, resp. rate 18, height 5\' 1"  (1.549 m), weight 61.3 kg (135 lb 2.3 oz), SpO2 100 %. No results found. No results for input(s): WBC, HGB, HCT, PLT in the last 72 hours. No results for input(s): NA, K, CL, GLUCOSE, BUN, CREATININE, CALCIUM in the last 72 hours.  Invalid input(s): CO CBG (last 3)   Recent Labs  07/23/15 1642 07/23/15 2045 07/24/15 0630  GLUCAP 248* 97 97    Wt Readings from Last 3 Encounters:  07/24/15 61.3 kg (135 lb 2.3 oz)  07/11/15 66.225 kg (146 lb)  01/06/14 66.996 kg (147 lb 11.2 oz)    Physical Exam:  Constitutional: She appears well-developed and well-nourished. NAD. HENT: Normocephalic and atraumatic.  Eyes: Conjunctivae and EOM are normal.  Cardiovascular: Irregular irregular. RUE fistula  Respiratory: Effort normal and breath sounds normal. No respiratory distress.  GI: Soft. Bowel sounds are normal. She exhibits no distension.  Musculoskeletal: She exhibits edema and tenderness. Both first cmc's tender Neurological: She is alert and oriented.  Motor: B/l UE 4/5 proximal to distal LLE: 3 proximally, 4/5 distally RLE: hip flexion 2/5, wiggles toes  Skin:  Right leg wounds clean/dry with Steri-Strips, area of eschar on patella Appears to be healing with pink granulation tissue around the edges.   Psychiatric: She has a normal mood and affect. Her behavior is normal.     Assessment/Plan: 1. Functional deficits secondary to right tib/fib fx which require 3+ hours per day of interdisciplinary therapy in a comprehensive inpatient rehab setting. Physiatrist is providing close team supervision and 24 hour management of  active medical problems listed below. Physiatrist and rehab team continue to assess barriers to discharge/monitor patient progress toward functional and medical goals.  Function:  Bathing Bathing position   Position: Other (comment) (sitting on commode)  Bathing parts Body parts bathed by patient: Right arm, Left arm, Chest, Abdomen, Front perineal area, Buttocks, Right upper leg, Left lower leg, Left upper leg Body parts bathed by helper: Back, Right lower leg  Bathing assist Assist Level: Touching or steadying assistance(Pt > 75%)      Upper Body Dressing/Undressing Upper body dressing   What is the patient wearing?: Pull over shirt/dress, Bra Bra - Perfomed by patient: Thread/unthread right bra strap, Thread/unthread left bra strap Bra - Perfomed by helper: Hook/unhook bra (pull down sports bra) Pull over shirt/dress - Perfomed by patient: Thread/unthread right sleeve, Thread/unthread left sleeve, Put head through opening, Pull shirt over trunk Pull over shirt/dress - Perfomed by helper: Pull shirt over trunk        Upper body assist Assist Level: Touching or steadying assistance(Pt > 75%)   Set up : To obtain clothing/put away  Lower Body Dressing/Undressing Lower body dressing   What is the patient wearing?: Underwear, Pants Underwear - Performed by patient: Thread/unthread right underwear leg, Thread/unthread left underwear leg Underwear - Performed by helper: Pull underwear up/down Pants- Performed by patient: Thread/unthread left pants leg Pants- Performed by helper: Thread/unthread right pants leg, Pull pants up/down Non-skid slipper socks- Performed by patient: Don/doff left sock Non-skid slipper socks- Performed by helper: Don/doff right sock, Don/doff left sock Socks - Performed by patient: Don/doff left sock Socks -  Performed by helper: Don/doff right sock Shoes - Performed by patient: Don/doff left shoe Shoes - Performed by helper: Fasten left, Don/doff right  shoe          Lower body assist Assist for lower body dressing:  (mod a)      Toileting Toileting Toileting activity did not occur: N/A Toileting steps completed by patient: Adjust clothing prior to toileting, Performs perineal hygiene, Adjust clothing after toileting Toileting steps completed by helper: Adjust clothing after toileting, Adjust clothing prior to toileting Toileting Assistive Devices: Grab bar or rail  Toileting assist Assist level: Touching or steadying assistance (Pt.75%)   Transfers Chair/bed transfer   Chair/bed transfer method: Stand pivot Chair/bed transfer assist level: Touching or steadying assistance (Pt > 75%) Chair/bed transfer assistive device: Walker, Armrests, Orthosis     Locomotion Ambulation Ambulation activity did not occur: Safety/medical concerns (unsafe on eval)   Max distance: 10' Assist level: Touching or steadying assistance (Pt > 75%)   Wheelchair   Type: Manual Max wheelchair distance: 100' Assist Level: Touching or steadying assistance (Pt > 75%)  Cognition Comprehension Comprehension assist level: Understands basic 75 - 89% of the time/ requires cueing 10 - 24% of the time  Expression Expression assist level: Expresses basic 75 - 89% of the time/requires cueing 10 - 24% of the time. Needs helper to occlude trach/needs to repeat words.  Social Interaction Social Interaction assist level: Interacts appropriately 90% of the time - Needs monitoring or encouragement for participation or interaction.  Problem Solving Problem solving assist level: Solves basic problems with no assist  Memory Memory assist level: Recognizes or recalls 90% of the time/requires cueing < 10% of the time    Medical Problem List and Plan: 1. Limited functional mobility secondary to comminuted right tibia-fibula fracture. Status post ORIF 07/07/2015. Nonweightbearing. Knee immobilizer when out of bed. No ROM precautions  -continue CIR therapies 2. DVT  Prophylaxis/Anticoagulation: Subcutaneous heparin. Monitor platelet counts and any signs of bleeding 3. Pain Management: tylenol only due to cognitve side effects (family requested)  - voltaren gel for bilateral hand OA 4. Acute blood loss anemia. hgb 9.9. Fe++ supp per nephrology 5. Neuropsych: This patient is capable of making decisions on her own behalf. At baseline 6. Skin/Wound Care: can change right leg dressing as needed  -dressing/pressure relief to areas of blistering.   -incisions looks great overall 7. Fluids/Electrolytes/Nutrition:  8. End-stage renal disease with hemodialysis. HD after therapy. 9. PAF. Aspirin 325 mg daily. Cardiac rate control 10. Hypertension. Hydralazine 50 mg twice a day, Imdur 30 mg daily 11. Diastolic congestive heart failure. Monitor for any signs of fluid overload 12. Diabetes mellitus. Checking blood sugars before meals and at bedtime.      -increased am amaryl to 2mg ---may need further titration.   -reduced SSI as she tends to rebound from rx of high or low cbg's.    -Was labile,? Stabilizing Will continue to monitor CBG (last 3)   Recent Labs  07/23/15 1642 07/23/15 2045 07/24/15 0630  GLUCAP 248* 97 97   13. Hypothyroidism. Synthroid 75 g daily 14. Hyperlipidemia. Pravachol 15.  Leukocytosis but afebrile, no c/o except RLE pain, will monitor , incisions look ok.   CBC Latest Ref Rng 07/19/2015 07/17/2015 07/16/2015  WBC 4.0 - 10.5 K/uL 12.9(H) 12.5(H) 14.2(H)  Hemoglobin 12.0 - 15.0 g/dL 9.9(L) 9.5(L) 9.8(L)  Hematocrit 36.0 - 46.0 % 29.6(L) 29.0(L) 30.3(L)  Platelets 150 - 400 K/uL 394 391 385   LOS (Days) 12 A  FACE TO FACE EVALUATION WAS PERFORMED  SWARTZ,ZACHARY T 07/24/2015 8:40 AM

## 2015-07-24 NOTE — Progress Notes (Signed)
Physical Therapy Session Note  Patient Details  Name: [name] MRN: [id number] Date of Birth: February 04, 1929  Today's Date: 07/24/2015 PT Individual Time: 1129-1200 PT Individual Time Calculation (min): 31 min   Short Term Goals: Week 1:  PT Short Term Goal 1 (Week 1): Pt will be able to perform basic transfers with min assist PT Short Term Goal 1 - Progress (Week 1): Partly met (inconsisent from min to max assist) PT Short Term Goal 2 (Week 1): Pt will be able to perform sit <> stands with min assist PT Short Term Goal 2 - Progress (Week 1): Partly met (inconsistent from min to max assist) PT Short Term Goal 3 (Week 1): Pt will be able to propel w/c x 150' with supervision PT Short Term Goal 3 - Progress (Week 1): Met PT Short Term Goal 4 (Week 1): Pt will be able to initiate short distance gait PT Short Term Goal 4 - Progress (Week 1): Met Week 2:  PT Short Term Goal 1 (Week 2): = LTGs      Therapy Documentation Precautions:  Precautions Precautions: Fall Precaution Comments: HD MWF Required Braces or Orthoses: Knee Immobilizer - Right Knee Immobilizer - Right: On when out of bed or walking Restrictions Weight Bearing Restrictions: Yes RLE Weight Bearing: Non weight bearing Pain: Patient denies any pain.    Patient propelled wheelchair 75 feetx2 with rest break required for 2 minutes to go the entire 150 feet. Patient required verbal cues for obstacle avoidance and to maintain a straight trajectory.   Block practice stand pivot transfer with RW NWB RLE initially min assist however progress to mod assist to and from mat of even height surface with fatigue. Patient with increased fatigue and required rest break after each transfer. Verbalc and tactile cues to maintain NWB RLE.  Standing ther ex: Min assist for right hip flexion 10 and right hip abduction 10x.   Patient performed car transfer with mod assist with RW. Max verbal cues for sequence and technique.   Patient  tolerated tx well, NWB RLE maintatin throughout session and right knee immboilizer donned. Patient returned to room at end of session with all needs met. Nurse [employer] present at end of session.   See Function Navigator for Current Functional Status.   Therapy/Group: Individual Therapy  Retta Diones 07/24/2015, 12:14 PM

## 2015-07-24 NOTE — Progress Notes (Signed)
Physical Therapy Session Note  Patient Details  Name: [name] MRN: [id number] Date of Birth: [birth year]  Today's Date: 07/24/2015 PT Individual Time: 1430-1500 PT Individual Time Calculation (min): 30 min   Short Term Goals: Week 2:  PT Short Term Goal 1 (Week 2): = LTGs  Skilled Therapeutic Interventions/Progress Updates:   Pt up in recliner with ice pack under KI for pain and edema control. Pt reports it was helpful. Due to HD today, goal of session was to transfer back to bed at end to be ready for HD transport. Pt performed 2 trials with gait with RW with overall steady to min assist for sit <> stands and actual gait. Pt with difficulty hopping backwards requiring increased assist for balance. Verbal cues throughout for technique and hand placement with transfers. Pt able to maintain and recall precautions without verbal cues. Once back in bed, practiced sit to supine without use of rails as pt will not have this at home - cues for problem solving technique but able to do with supervision (scooting). Downgraded transfer and bed mobility goals to min assist for balance and RLE management. Once in bed, handout given for LE therex and pt performed knee flexion and quad sets x 10 reps each.   Therapy Documentation Precautions:  Precautions Precautions: Fall Precaution Comments: HD MWF Required Braces or Orthoses: Knee Immobilizer - Right Knee Immobilizer - Right: On when out of bed or walking Restrictions Weight Bearing Restrictions: Yes RLE Weight Bearing: Non weight bearing  Pain: C/o RLE pain with movement. Using ice pack.   See Function Navigator for Current Functional Status.   Therapy/Group: Individual Therapy  Canary Brim Ivory Broad, PT, DPT  07/24/2015, 3:32 PM

## 2015-07-24 NOTE — Progress Notes (Signed)
07/24/2015 11:09 AM Hemodialysis Outpatient Note;this patient has been accepted at the [facility] on a Monday,Wednesday and Friday 2nd shift schedule. The patient will need to arrive at the center at 12:30PM. I have spoken to the patient's daughter and made her aware of the schedule. Thank you. Gordy Savers

## 2015-07-25 ENCOUNTER — Inpatient Hospital Stay (HOSPITAL_COMMUNITY): Payer: Medicare Other | Admitting: Occupational Therapy

## 2015-07-25 ENCOUNTER — Inpatient Hospital Stay (HOSPITAL_COMMUNITY): Payer: Medicare Other

## 2015-07-25 LAB — RENAL FUNCTION PANEL
ANION GAP: 16 — AB (ref 5–15)
Albumin: 2.4 g/dL — ABNORMAL LOW (ref 3.5–5.0)
BUN: 46 mg/dL — ABNORMAL HIGH (ref 6–20)
CHLORIDE: 98 mmol/L — AB (ref 101–111)
CO2: 22 mmol/L (ref 22–32)
Calcium: 8.8 mg/dL — ABNORMAL LOW (ref 8.9–10.3)
Creatinine, Ser: 8.56 mg/dL — ABNORMAL HIGH (ref 0.44–1.00)
GFR, EST AFRICAN AMERICAN: 4 mL/min — AB (ref >60)
GFR, EST NON AFRICAN AMERICAN: 4 mL/min — AB (ref >60)
Glucose, Bld: 155 mg/dL — ABNORMAL HIGH (ref 65–99)
Phosphorus: 6.2 mg/dL — ABNORMAL HIGH (ref 2.5–4.6)
Potassium: 5.1 mmol/L (ref 3.5–5.1)
Sodium: 136 mmol/L (ref 135–145)

## 2015-07-25 LAB — GLUCOSE, CAPILLARY
GLUCOSE-CAPILLARY: 90 mg/dL (ref 65–99)
GLUCOSE-CAPILLARY: 157 mg/dL — AB (ref 65–99)
Glucose-Capillary: 204 mg/dL — ABNORMAL HIGH (ref 65–99)
Glucose-Capillary: 123 mg/dL — ABNORMAL HIGH (ref 65–99)

## 2015-07-25 MED ORDER — ACETAMINOPHEN 325 MG PO TABS
ORAL_TABLET | ORAL | Status: AC
  Administered 2015-07-25: 325 mg via ORAL
  Filled 2015-07-25: qty 1

## 2015-07-25 MED ORDER — INSULIN ASPART 100 UNIT/ML ~~LOC~~ SOLN
0.0000 [IU] | Freq: Three times a day (TID) | SUBCUTANEOUS | Status: DC
  Administered 2015-07-25 – 2015-07-27 (×2): 2 [IU] via SUBCUTANEOUS

## 2015-07-25 MED ORDER — GLIMEPIRIDE 2 MG PO TABS
3.0000 mg | ORAL_TABLET | Freq: Every day | ORAL | Status: DC
  Administered 2015-07-26 – 2015-07-28 (×3): 3 mg via ORAL
  Filled 2015-07-25 (×3): qty 1

## 2015-07-25 MED ORDER — SEVELAMER CARBONATE 800 MG PO TABS
800.0000 mg | ORAL_TABLET | Freq: Three times a day (TID) | ORAL | Status: DC
  Administered 2015-07-25 – 2015-07-28 (×8): 800 mg via ORAL
  Filled 2015-07-25 (×9): qty 1

## 2015-07-25 NOTE — Progress Notes (Signed)
Occupational Therapy Session Note  Patient Details  Name: [name] MRN: [id number] Date of Birth: 26-Jun-1928  Today's Date: 07/25/2015 OT Individual Time: 1101-1200 OT Individual Time Calculation (min): 59 min    Short Term Goals: Week 2:  OT Short Term Goal 1 (Week 2): STG=LTG due to LOS  Skilled Therapeutic Interventions/Progress Updates:   Upon entering the room, pt seated in wheelchair with c/o fatigue as pt states, "I had dialysis until the morning time. I am very tired." Pt declined ambulation this session. Pt propelled wheelchair onto elevator in order to go to gift shop for community mobility task in wheelchair and for B UE strength and endurance. Pt propelled wheelchair in gift shop over thresholds, aisles, and tight turns with increased time and supervision to do so. Pt utilized long handled reacher to obtain items from low and high places from wheelchair level. Pt returning to room in same manner as above via wheelchair. Pt needed rest breaks secondary to fatigue. She returned to room with call bell and all needed items within reach upon exiting the room.   Therapy Documentation Precautions:  Precautions Precautions: Fall Precaution Comments: HD MWF Required Braces or Orthoses: Knee Immobilizer - Right Knee Immobilizer - Right: On when out of bed or walking Restrictions Weight Bearing Restrictions: Yes RLE Weight Bearing: Non weight bearing General:   Vital Signs: Therapy Vitals BP: (!) 120/45 mmHg  See Function Navigator for Current Functional Status.   Therapy/Group: Individual Therapy  Phineas Semen 07/25/2015, 12:55 PM

## 2015-07-25 NOTE — Progress Notes (Signed)
Social Work Patient ID: [name], female   DOB: [birth year], 82 y.o.   MRN: [id number]   [name] Curb, LCSW Social Worker Signed  Patient Care Conference 07/25/2015  3:21 PM    Expand All Collapse All   Inpatient RehabilitationTeam Conference and Plan of Care Update Date: 07/25/2015   Time: 2:20 PM     Patient Name: JOHNINE [name]       Medical Record Number: [id number]  Date of Birth: [birth year] Sex: Female         Room/Bed: 4M08C/4M08C-02 Payor Info: Payor: MEDICARE / Plan: MEDICARE PART A AND B / Product Type: *No Product type* /    Admitting Diagnosis: R tib fix fx  ESRD   HD   Admit Date/Time:  07/12/2015  2:59 PM Admission Comments: No comment available   Primary Diagnosis:  Closed fracture of right fibula and tibia Principal Problem: Closed fracture of right fibula and tibia    Patient Active Problem List     Diagnosis  Date Noted   .  Labile blood glucose     .  Memory loss     .  Hip fracture ([place])  07/12/2015   .  Swelling     .  Post-operative pain     .  Acute blood loss anemia     .  ESRD on dialysis ([place])     .  PAF (paroxysmal atrial fibrillation) ([place])     .  Benign essential HTN     .  Chronic diastolic congestive heart failure ([place])     .  Diabetes mellitus type 2 in nonobese (HCC)     .  HLD (hyperlipidemia)     .  Thyroid activity decreased     .  Elevated brain natriuretic peptide (BNP) level  01/06/2014   .  Chest pain, atypical  08/13/2012   .  Long term (current) use of anticoagulants  07/30/2012   .  Chronic combined systolic and diastolic heart failure EF 45-50% by echo 06/25/2012  07/30/2012   .  Breast cancer, s/p Lt mastectomy  07/22/2012   .  Chronic anticoagulation  07/22/2012   .  Acute on chronic systolic and diastolic heart failure, NYHA class 4 ([place])  07/20/2012   .  Pleural effusion due to CHF (congestive heart failure) ([place])  07/20/2012   .  Atrial fibrillation, spont convers NSR 06/26/12  06/25/2012   .  Hypotension, unspecified   06/25/2012   .  Diastolic CHF, chronic ([place])  06/25/2012   .  Right tibial fracture  06/21/2012   .  Metabolic bone disease  [id number]   .  Secondary hyperparathyroidism of renal origin ([place])  06/19/2012   .  Nonunion of fracture, R tibia  06/17/2012   .  H/O radioactive iodine thyroid ablation     .  Closed fracture of right fibula and tibia  07/10/2011   .  ESRD (end stage renal disease) ([place])  07/10/2011   .  HTN (hypertension)  07/10/2011   .  DM2 (diabetes mellitus, type 2) ([place])  07/10/2011   .  Hyperlipidemia  07/10/2011   .  Anemia  07/10/2011     Expected Discharge Date: Expected Discharge Date: 07/28/15  Team Members Present: Physician leading conference: Dr. Alger Simons Social Worker Present: Lennart Pall, LCSW Nurse Present: Rayetta Pigg, RN PT Present: Canary Brim, PT OT Present: Napoleon Form, OT PPS Coordinator present : Daiva Nakayama, RN, [place]  Current Status/Progress  Goal  Weekly Team Focus   Medical     wounds healing. some breakdown from prior splint---those areas healing also. keeping heel elevated. pain better   see prior, increase stamina  wound and edema mgt,    Bowel/Bladder     Pt continent of bowel. LBM 07/23/15. Oliguric HD-M, W, F  Managed bowel and bladder  Assess q shfit   Swallow/Nutrition/ Hydration               ADL's     steadying assist bathing/ dressing; min- mod functional transfers   min A overall  functional transfers; pain management; ADL re-training; family education and d/c planning.    Mobility     min to mod assist w/c level with short distance min assist gait with RW; supervision w/c propulsion  downgraded bed mobility and transfer goals to min assist w/c level; supervision w/c propulsion  transfers, cognition, endurance, strengthening, family education, standing balance   Communication               Safety/Cognition/ Behavioral Observations              Pain     RLE discomfort @times . Tylenol 650mg  q 4hrs prn  <3  Assess  for effectiveness   Skin     RLE w/steristrips to extremity. Dry dressing to R patella. Nonstageable to R achilles w/Allevyn dressing to area   No additona skin breakdown  Monitor and educate to keep heel elevated     Rehab Goals Patient on target to meet rehab goals: Yes *See Care Plan and progress notes for long and short-term goals.    Barriers to Discharge:  see prior, exercise tolerance     Possible Resolutions to Barriers:   pacing/schedule adjustment, earlier HD      Discharge Planning/Teaching Needs:   Plan home with daughter, Lorenza Chick, in [place] and daughter arranging 24/7 assistance.  Teaching to be scheduled.  Hope to complete with private caregivers.    Team Discussion:    Leg wounds healing.  On track for d/c end of the week but need to complete caregiver ed.  Reaching min assist goals.   Revisions to Treatment Plan:    None    Continued Need for Acute Rehabilitation Level of Care: The patient requires daily medical management by a physician with specialized training in physical medicine and rehabilitation for the following conditions: Daily direction of a multidisciplinary physical rehabilitation program to ensure safe treatment while eliciting the highest outcome that is of practical value to the patient.: Yes Daily medical management of patient stability for increased activity during participation in an intensive rehabilitation regime.: Yes Daily analysis of laboratory values and/or radiology reports with any subsequent need for medication adjustment of medical intervention for : Wound care problems;Post surgical problems;Renal problems  [name] 07/25/2015, 3:21 PM                 [name] Curb, LCSW Social Worker Signed  Patient Care Conference 07/19/2015  9:31 AM    Expand All Collapse All   Inpatient RehabilitationTeam Conference and Plan of Care Update Date: 07/18/2015   Time: 2:20 PM     Patient Name: [name]       Medical Record Number:  [id number]  Date of Birth: [birth year] Sex: Female         Room/Bed: 4M08C/4M08C-02 Payor Info: Payor: MEDICARE / Plan: MEDICARE PART A AND B / Product Type: *No Product type* /  Admitting Diagnosis: R tib fix fx  ESRD   HD   Admit Date/Time:  07/12/2015  2:59 PM Admission Comments: No comment available   Primary Diagnosis:  Closed fracture of right fibula and tibia Principal Problem: Closed fracture of right fibula and tibia    Patient Active Problem List     Diagnosis  Date Noted   .  Memory loss     .  Hip fracture ([place])  07/12/2015   .  Swelling     .  Post-operative pain     .  Acute blood loss anemia     .  ESRD on dialysis ([place])     .  PAF (paroxysmal atrial fibrillation) ([place])     .  Benign essential HTN     .  Chronic diastolic congestive heart failure ([place])     .  Diabetes mellitus type 2 in nonobese (H[facility])     .  HLD (hyperlipidemia)     .  Thyroid activity decreased     .  Elevated brain natriuretic peptide (BNP) level  01/06/2014   .  Chest pain, atypical  08/13/2012   .  Long term (current) use of anticoagulants  07/30/2012   .  Chronic combined systolic and diastolic heart failure EF 45-50% by echo 06/25/2012  07/30/2012   .  Breast cancer, s/p Lt mastectomy  07/22/2012   .  Chronic anticoagulation  07/22/2012   .  Acute on chronic systolic and diastolic heart failure, NYHA class 4 ([place])  07/20/2012   .  Pleural effusion due to CHF (congestive heart failure) ([place])  07/20/2012   .  Atrial fibrillation, spont convers NSR 06/26/12  06/25/2012   .  Hypotension, unspecified  06/25/2012   .  Diastolic CHF, chronic ([place])  06/25/2012   .  Right tibial fracture  06/21/2012   .  Metabolic bone disease  [id number]   .  Secondary hyperparathyroidism of renal origin ([place])  06/19/2012   .  Nonunion of fracture, R tibia  06/17/2012   .  H/O radioactive iodine thyroid ablation     .  Closed fracture of right fibula and tibia  07/10/2011   .  ESRD (end stage renal disease) ([place])   07/10/2011   .  HTN (hypertension)  07/10/2011   .  DM2 (diabetes mellitus, type 2) ([place])  07/10/2011   .  Hyperlipidemia  07/10/2011   .  Anemia  07/10/2011     Expected Discharge Date: Expected Discharge Date: 07/28/15  Team Members Present: Physician leading conference: Dr. Alger Simons Social Worker Present: Lennart Pall, LCSW Nurse Present: Dorien Chihuahua, RN PT Present: Canary Brim, PT OT Present: Napoleon Form, OT PPS Coordinator present : Daiva Nakayama, RN, CRRN        Current Status/Progress  Goal  Weekly Team Focus   Medical     right leg healing nicely. pain under control. staplpes out tomorrow. hands better with cream.   improved safety awareness, decreased pain  wound care, pain control, safety   Bowel/Bladder     Pt continent of bowel LBM 4-29. Oliguric.   manage b/b min assist  contiune to assess bowel pattern and intervene with prn medication as needed   Swallow/Nutrition/ Hydration               ADL's     min A bathing bed level only, min A to don pants, mod - max toileting for clothing management, mod A to sit to stand for  transfer with RW. - pivots on L foot  min A bathing, set up/S dressing and toileting and toilet transfer, S standing balance  activity tolerance training with ADLS and ther ex, sit to stand, standing balance, AE training, pt/family education    Mobility     min to mod assist w/c level; max assist standing; supervision/min w/c propulsion  supervision/min w/c level; d/c short distance gait goal  transfers, cognition, endurance, strengthening, activity tolerance, maintaining WB restrictions, d/c planning, education    Communication               Safety/Cognition/ Behavioral Observations              Pain     pain at times to RLE- tylenol 325-650 mg PRN q4hrs   4 or less  assess pain and medicate as needed    Skin     staples to RLE- non adherant pad with kerlex inplace,   free of skin breakdown and free from infection min assist   assess skin  daily change dressing RLE prn    Rehab Goals Patient on target to meet rehab goals: Yes *See Care Plan and progress notes for long and short-term goals.    Barriers to Discharge:  cognition/safety awareness     Possible Resolutions to Barriers:   supervison at home, adaptive techniques and equipment      Discharge Planning/Teaching Needs:   Plan home with daughter, Lorenza Chick, in [place] and daughter arranging 24/7 assistance.  Teaching to be scheduled.    Team Discussion:    Expected to be able to remove staples tomorrow.  Cognition best in the morning.  Min assist w/c level goals and must have a ramp.  Having a lot of trouble maintain WB precautions.   Revisions to Treatment Plan:    None    Continued Need for Acute Rehabilitation Level of Care: The patient requires daily medical management by a physician with specialized training in physical medicine and rehabilitation for the following conditions: Daily direction of a multidisciplinary physical rehabilitation program to ensure safe treatment while eliciting the highest outcome that is of practical value to the patient.: Yes Daily medical management of patient stability for increased activity during participation in an intensive rehabilitation regime.: Yes Daily analysis of laboratory values and/or radiology reports with any subsequent need for medication adjustment of medical intervention for : Post surgical problems;Neurological problems;Wound care problems  Adline Kirshenbaum 07/19/2015, 9:31 AM

## 2015-07-25 NOTE — Progress Notes (Signed)
Physical Therapy Session Note  Patient Details  Name: [name] MRN: [id number] Date of Birth: 06/13/1928  Today's Date: 07/25/2015 PT Individual Time: 1300-1355 PT Individual Time Calculation (min): 55 min   Short Term Goals: Week 2:  PT Short Term Goal 1 (Week 2): = LTGs  Skilled Therapeutic Interventions/Progress Updates:   Pt did not receive lunch tray - called down to kitchen to place order. Pt agreeable to try to work with therapy to tolerance until lunch arrived.  Pt propelled w/c on unit at supervision for functional strengthening, endurance, and mobility. Simulated car transfer with RW and short distance gait with RW with overall min assist. Introduced leg lifter to pt to help with RLE management. Able to return demonstrate with car transfer and recommended to try later when returning to bed. Pt able to perform functional sit to stands and standing therex with RLE for hip abduction and hip flexion x 10-15 reps each 1-2 sets. Pt requested to change bedding and performed some of it from w/c level - removing blankets from bed, changing pillow cases, and helping with sheet placement with cues for technique. Pt stayed up in w/c awaiting lunch tray with all needs in reach.   Therapy Documentation Precautions:  Precautions Precautions: Fall Precaution Comments: HD MWF Required Braces or Orthoses: Knee Immobilizer - Right Knee Immobilizer - Right: On when out of bed or walking Restrictions Weight Bearing Restrictions: Yes RLE Weight Bearing: Non weight bearing  Pain: No complaints.   See Function Navigator for Current Functional Status.   Therapy/Group: Individual Therapy  Canary Brim Ivory Broad, PT, DPT  07/25/2015, 2:11 PM

## 2015-07-25 NOTE — Progress Notes (Signed)
Occupational Therapy Session Note  Patient Details  Name: [name] MRN: [id number] Date of Birth: 1928-11-23  Today's Date: 07/25/2015 OT Individual Time: 0945-1100 OT Individual Time Calculation (min): 75 min    Short Term Goals: Week 2:  OT Short Term Goal 1 (Week 2): STG=LTG due to LOS  Skilled Therapeutic Interventions/Progress Updates:    Pt seen for OT ADL bathing/dressing session. Pt in supine upon arrival, agreeable to tx session. She transferred to EOB with guarding assist for R LE. All transfers completed throughout session via stand pivot with RW. Pt demonstrated improved sit <> stand abilities this session able to stand with supervision-min A and light steadying assist during hand transition to RW.  She completed toileting task with steadying assist.  She completed bathing/dressing seated in w/c at sink with set-up assist and steadying for standing LB dressing tasks. ACE wraps donned to R LE per nursing order for edema management.  She completed grooming tasks from w/c level mod I. Pt requires R LE to be supported during seated tasks, however, pain better managed today and pt able to tolerate LE in non-dependent position when transitioning to standing without unbearable pain.  Pt left sitting in w/c at end of session, all needs in reach.  Educated throughout session regarding deep breathing techniques for pain management, w/c part management, and d/c planning.   Therapy Documentation Precautions:  Precautions Precautions: Fall Precaution Comments: HD MWF Required Braces or Orthoses: Knee Immobilizer - Right Knee Immobilizer - Right: On when out of bed or walking Restrictions Weight Bearing Restrictions: Yes RLE Weight Bearing: Non weight bearing Pain: Pain Assessment Pain Assessment: No/denies pain  See Function Navigator for Current Functional Status.   Therapy/Group: Individual Therapy  Lewis, Audy Dauphine C 07/25/2015, 7:11 AM

## 2015-07-25 NOTE — Progress Notes (Signed)
[facility] PHYSICAL MEDICINE & REHABILITATION     PROGRESS NOTE    Subjective/Complaints: No new complaints except for swelling right leg    ROS: Denies CP, SOB, nausea, vomiting, diarrhea.  Objective: Vital Signs: Blood pressure 139/41, pulse 58, temperature 98.4 F (36.9 C), temperature source Oral, resp. rate 18, height 5\' 1"  (1.549 m), weight 60.4 kg (133 lb 2.5 oz), SpO2 99 %. No results found. No results for input(s): WBC, HGB, HCT, PLT in the last 72 hours.  Recent Labs  07/25/15 0040  NA 136  K 5.1  CL 98*  GLUCOSE 155*  BUN 46*  CREATININE 8.56*  CALCIUM 8.8*   CBG (last 3)   Recent Labs  07/24/15 1700 07/24/15 2057 07/25/15 0653  GLUCAP 86 200* 90    Wt Readings from Last 3 Encounters:  07/25/15 60.4 kg (133 lb 2.5 oz)  07/11/15 66.225 kg (146 lb)  01/06/14 66.996 kg (147 lb 11.2 oz)    Physical Exam:  Constitutional: She appears well-developed and well-nourished. NAD. HENT: Normocephalic and atraumatic.  Eyes: Conjunctivae and EOM are normal.  Cardiovascular: Irregular irregular. RUE fistula  Respiratory: Effort normal and breath sounds normal. No respiratory distress.  GI: Soft. Bowel sounds are normal. She exhibits no distension.  Musculoskeletal: She exhibits edema and tenderness LLE. Both first cmc's tender Neurological: She is alert and oriented.  Motor: B/l UE 4/5 proximal to distal LLE: 3 proximally, 4/5 distally RLE: hip flexion 2/5, wiggles toes  Skin:  Right leg wounds clean/dry with Steri-Strips, area of eschar on patella Appears to be healing with pink granulation tissue around the edges.  Heel with healing blister Psychiatric: She has a normal mood and affect. Her behavior is normal.     Assessment/Plan: 1. Functional deficits secondary to right tib/fib fx which require 3+ hours per day of interdisciplinary therapy in a comprehensive inpatient rehab setting. Physiatrist is providing close team supervision and 24 hour management  of active medical problems listed below. Physiatrist and rehab team continue to assess barriers to discharge/monitor patient progress toward functional and medical goals.  Function:  Bathing Bathing position   Position: Wheelchair/chair at sink  Bathing parts Body parts bathed by patient: Right arm, Left arm, Chest, Abdomen, Front perineal area, Buttocks, Right upper leg, Left lower leg, Left upper leg Body parts bathed by helper: Back  Bathing assist Assist Level: Touching or steadying assistance(Pt > 75%)      Upper Body Dressing/Undressing Upper body dressing   What is the patient wearing?: Pull over shirt/dress, Bra Bra - Perfomed by patient: Thread/unthread right bra strap, Thread/unthread left bra strap, Hook/unhook bra (pull down sports bra) Bra - Perfomed by helper: Hook/unhook bra (pull down sports bra) Pull over shirt/dress - Perfomed by patient: Thread/unthread right sleeve, Thread/unthread left sleeve, Put head through opening, Pull shirt over trunk Pull over shirt/dress - Perfomed by helper: Pull shirt over trunk        Upper body assist Assist Level: Set up   Set up : To obtain clothing/put away  Lower Body Dressing/Undressing Lower body dressing   What is the patient wearing?: Underwear, Pants Underwear - Performed by patient: Thread/unthread right underwear leg, Thread/unthread left underwear leg, Pull underwear up/down Underwear - Performed by helper: Pull underwear up/down Pants- Performed by patient: Thread/unthread right pants leg, Thread/unthread left pants leg Pants- Performed by helper: Pull pants up/down Non-skid slipper socks- Performed by patient: Don/doff left sock, Don/doff right sock Non-skid slipper socks- Performed by helper: Don/doff right sock,  Don/doff left sock Socks - Performed by patient: Don/doff left sock Socks - Performed by helper: Don/doff right sock Shoes - Performed by patient: Don/doff left shoe Shoes - Performed by helper: Fasten  left, Don/doff right shoe          Lower body assist Assist for lower body dressing: Touching or steadying assistance (Pt > 75%)      Toileting Toileting Toileting activity did not occur: N/A Toileting steps completed by patient: Adjust clothing prior to toileting, Performs perineal hygiene, Adjust clothing after toileting Toileting steps completed by helper: Adjust clothing after toileting, Adjust clothing prior to toileting Toileting Assistive Devices: Grab bar or rail  Toileting assist Assist level: Touching or steadying assistance (Pt.75%)   Transfers Chair/bed transfer   Chair/bed transfer method: Lateral scoot Chair/bed transfer assist level: Touching or steadying assistance (Pt > 75%) Chair/bed transfer assistive device: Sliding board, Orthosis     Locomotion Ambulation Ambulation activity did not occur: Safety/medical concerns (unsafe on eval)   Max distance: 10' Assist level: Touching or steadying assistance (Pt > 75%)   Wheelchair   Type: Manual Max wheelchair distance: 100' Assist Level: Supervision or verbal cues  Cognition Comprehension Comprehension assist level: Understands basic 90% of the time/cues < 10% of the time  Expression Expression assist level: Expresses basic 90% of the time/requires cueing < 10% of the time.  Social Interaction Social Interaction assist level: Interacts appropriately 90% of the time - Needs monitoring or encouragement for participation or interaction.  Problem Solving Problem solving assist level: Solves basic problems with no assist  Memory Memory assist level: Recognizes or recalls 90% of the time/requires cueing < 10% of the time    Medical Problem List and Plan: 1. Limited functional mobility secondary to comminuted right tibia-fibula fracture. Status post ORIF 07/07/2015. Nonweightbearing. Knee immobilizer when out of bed. No ROM precautions  -continue CIR therapies 2. DVT Prophylaxis/Anticoagulation: Subcutaneous heparin.  Monitor platelet counts and any signs of bleeding 3. Pain Management: tylenol only due to cognitve side effects (family requested)  - voltaren gel for bilateral hand OA 4. Acute blood loss anemia. hgb 9.9. Fe++ supp per nephrology 5. Neuropsych: This patient is capable of making decisions on her own behalf. At baseline 6. Skin/Wound Care: can change right leg dressing as needed  -dressing/pressure relief to areas of blistering.   -incisions looks great overall  -can ACE wrap RLE 7. Fluids/Electrolytes/Nutrition:  8. End-stage renal disease with hemodialysis. HD after therapy. 9. PAF. Aspirin 325 mg daily. Cardiac rate control 10. Hypertension. Hydralazine 50 mg twice a day, Imdur 30 mg daily 11. Diastolic congestive heart failure. Monitor for any signs of fluid overload 12. Diabetes mellitus. Checking blood sugars before meals and at bedtime.      -increase am amaryl to 3mg ---may need further titration.   -reduced SSI as she tends to rebound from rx of high or low cbg's.    -Was labile,? Stabilizing Will continue to monitor CBG (last 3)   Recent Labs  07/24/15 1700 07/24/15 2057 07/25/15 0653  GLUCAP 86 200* 90   13. Hypothyroidism. Synthroid 75 g daily 14. Hyperlipidemia. Pravachol 15.  Leukocytosis but afebrile, incisions look ok.   CBC Latest Ref Rng 07/19/2015 07/17/2015 07/16/2015  WBC 4.0 - 10.5 K/uL 12.9(H) 12.5(H) 14.2(H)  Hemoglobin 12.0 - 15.0 g/dL 9.9(L) 9.5(L) 9.8(L)  Hematocrit 36.0 - 46.0 % 29.6(L) 29.0(L) 30.3(L)  Platelets 150 - 400 K/uL 394 391 385   LOS (Days) 13 A FACE TO FACE EVALUATION WAS  PERFORMED  Wallis Vancott T 07/25/2015 8:14 AM

## 2015-07-25 NOTE — Progress Notes (Signed)
[facility] Rounding Note  Subjective: Doing well. Tolerated HD well last night.  Intermittent intching over the past few days.   Objective: Vital signs in last 24 hours: Temp:  [97.9 F (36.6 C)-98.4 F (36.9 C)] 98.4 F (36.9 C) (05/09 0430) Pulse Rate:  [49-68] 58 (05/09 0430) Resp:  [16-18] 18 (05/09 0430) BP: (121-164)/(37-112) 139/41 mmHg (05/09 0430) SpO2:  [99 %] 99 % (05/08 1417) Weight:  [133 lb 2.5 oz (60.4 kg)-137 lb 2 oz (62.2 kg)] 133 lb 2.5 oz (60.4 kg) (05/09 0430) Weight change: 1 lb 15.8 oz (0.9 kg)  General: alert sitting in chair, NAD Heart: S1S2 No S3 Lungs: CTA Abdomen: soft NT Extremities: No edema of LLE. RLE in immobilizer, with trace pedal edema Dialysis Access: right upper AVF + bruit. Old non-functional AVG R forearm   Recent Labs  07/25/15 0040  NA 136  K 5.1  CL 98*  CO2 22  GLUCOSE 155*  BUN 46*  CREATININE 8.56*  CALCIUM 8.8*  PHOS 6.2*     Recent Labs  07/25/15 0040  ALBUMIN 2.4*     Scheduled Medications: . aspirin  325 mg Oral Daily  . darbepoetin (ARANESP) injection - DIALYSIS  60 mcg Intravenous Q Fri-HD  . diclofenac sodium  2 g Topical TID  . feeding supplement (GLUCERNA SHAKE)  237 mL Oral BID BM  . [START ON 07/26/2015] glimepiride  3 mg Oral Q breakfast  . heparin  5,000 Units Subcutaneous Q8H  . hydrALAZINE  50 mg Oral BID  . insulin aspart  0-24 Units Subcutaneous TID AC & HS  . isosorbide mononitrate  30 mg Oral Daily  . levothyroxine  75 mcg Oral QAC breakfast  . multivitamin  1 tablet Oral QHS  . nebivolol  10 mg Oral Daily  . pravastatin  20 mg Oral q1800  . vitamin E  400 Units Oral Daily   Dialysis Orders: [facility] on MWF . EDW 64.5 HD  Bath 2K/2.5Ca  Time 3.5hrs  Heparin std.  Access RUE AVF  BFR 400 DFR 800   Assessment/Plan: Patient is an 82 year old female with history of ESRD on HD MWF admitted to inpatient rehab with a right tibia fracture s/p ORIF.  1. Right tib fx- s/p  ORIF (07/07/15), working with PT 2. ESRD - HD MWF.  Has spot at [facility] MWF 2nd shift while staying in [place] after discharge. Will be staying with her daughter "for a couple of months". EDW will be lower at discharge - about 61 kg. Using tight heparin due to recent surgery 1. Phos elevated - Will add sevelamer  3. Anemia - hgb have been stable 9-11, Aranesp 60 q Fri started 4/28, had ferric gluconate 125 X 3 doses with HD, last dose 5/6. Check CBC with HD on Wed 4. HTN/volume - controlled  5. Nutrition - renal carb mod diet/vits/nepro 6. DM2 7. PAF - on ASA only 8.   LOS: 13 days   [name] 07/25/2015,8:43 AM  I have seen and examined this patient and agree with plan and assessment in the above note. Outpt HD arrangements complete, anticipate discharge to home on Friday after HD. Adding sevelamer for elev phos (prior issues with hypercalcemia - so no ca based binders) . EDW will be lower at discharge  [name] Bressi B,MD 07/25/2015 12:51 PM

## 2015-07-25 NOTE — Patient Care Conference (Signed)
Inpatient RehabilitationTeam Conference and Plan of Care Update Date: 07/25/2015   Time: 2:20 PM    Patient Name: [name]      Medical Record Number: [id number]  Date of Birth: [birth year] Sex: Female         Room/Bed: 4M08C/4M08C-02 Payor Info: Payor: MEDICARE / Plan: MEDICARE PART A AND B / Product Type: *No Product type* /    Admitting Diagnosis: R tib fix fx  ESRD   HD  Admit Date/Time:  07/12/2015  2:59 PM Admission Comments: No comment available   Primary Diagnosis:  Closed fracture of right fibula and tibia Principal Problem: Closed fracture of right fibula and tibia  Patient Active Problem List   Diagnosis Date Noted  . Labile blood glucose   . Memory loss   . Hip fracture ([place]) 07/12/2015  . Swelling   . Post-operative pain   . Acute blood loss anemia   . ESRD on dialysis ([place])   . PAF (paroxysmal atrial fibrillation) ([place])   . Benign essential HTN   . Chronic diastolic congestive heart failure ([place])   . Diabetes mellitus type 2 in nonobese (H[facility])   . HLD (hyperlipidemia)   . Thyroid activity decreased   . Elevated brain natriuretic peptide (BNP) level 01/06/2014  . Chest pain, atypical 08/13/2012  . Long term (current) use of anticoagulants 07/30/2012  . Chronic combined systolic and diastolic heart failure EF 45-50% by echo 06/25/2012 07/30/2012  . Breast cancer, s/p Lt mastectomy 07/22/2012  . Chronic anticoagulation 07/22/2012  . Acute on chronic systolic and diastolic heart failure, NYHA class 4 ([place]) 07/20/2012  . Pleural effusion due to CHF (congestive heart failure) ([place]) 07/20/2012  . Atrial fibrillation, spont convers NSR 06/26/12 06/25/2012  . Hypotension, unspecified 06/25/2012  . Diastolic CHF, chronic ([place]) 06/25/2012  . Right tibial fracture 06/21/2012  . Metabolic bone disease [id number]  . Secondary hyperparathyroidism of renal origin ([place]) 06/19/2012  . Nonunion of fracture, R tibia 06/17/2012  . H/O radioactive iodine thyroid ablation   .  Closed fracture of right fibula and tibia 07/10/2011  . ESRD (end stage renal disease) ([place]) 07/10/2011  . HTN (hypertension) 07/10/2011  . DM2 (diabetes mellitus, type 2) ([place]) 07/10/2011  . Hyperlipidemia 07/10/2011  . Anemia 07/10/2011    Expected Discharge Date: Expected Discharge Date: 07/28/15  Team Members Present: Physician leading conference: Dr. Alger Simons Social Worker Present: Lennart Pall, LCSW Nurse Present: Rayetta Pigg, RN PT Present: Canary Brim, PT OT Present: Napoleon Form, OT PPS Coordinator present : Daiva Nakayama, RN, CRRN     Current Status/Progress Goal Weekly Team Focus  Medical   wounds healing. some breakdown from prior splint---those areas healing also. keeping heel elevated. pain better  see prior, increase stamina  wound and edema mgt,    Bowel/Bladder   Pt continent of bowel. LBM 07/23/15. Oliguric HD-M, W, F  Managed bowel and bladder  Assess q shfit   Swallow/Nutrition/ Hydration             ADL's   steadying assist bathing/ dressing; min- mod functional transfers  min A overall  functional transfers; pain management; ADL re-training; family education and d/c planning.    Mobility   min to mod assist w/c level with short distance min assist gait with RW; supervision w/c propulsion  downgraded bed mobility and transfer goals to min assist w/c level; supervision w/c propulsion  transfers, cognition, endurance, strengthening, family education, standing balance   Communication  Safety/Cognition/ Behavioral Observations            Pain   RLE discomfort @times . Tylenol 650mg  q 4hrs prn  <3  Assess for effectiveness   Skin   RLE w/steristrips to extremity. Dry dressing to R patella. Nonstageable to R achilles w/Allevyn dressing to area  No additona skin breakdown  Monitor and educate to keep heel elevated    Rehab Goals Patient on target to meet rehab goals: Yes *See Care Plan and progress notes for long and short-term  goals.  Barriers to Discharge: see prior, exercise tolerance    Possible Resolutions to Barriers:  pacing/schedule adjustment, earlier HD    Discharge Planning/Teaching Needs:  Plan home with daughter, Lorenza Chick, in [place] and daughter arranging 24/7 assistance.  Teaching to be scheduled.  Hope to complete with private caregivers.   Team Discussion:  Leg wounds healing.  On track for d/c end of the week but need to complete caregiver ed.  Reaching min assist goals.  Revisions to Treatment Plan:  None   Continued Need for Acute Rehabilitation Level of Care: The patient requires daily medical management by a physician with specialized training in physical medicine and rehabilitation for the following conditions: Daily direction of a multidisciplinary physical rehabilitation program to ensure safe treatment while eliciting the highest outcome that is of practical value to the patient.: Yes Daily medical management of patient stability for increased activity during participation in an intensive rehabilitation regime.: Yes Daily analysis of laboratory values and/or radiology reports with any subsequent need for medication adjustment of medical intervention for : Wound care problems;Post surgical problems;Renal problems  Dell Briner, [place] 07/25/2015, 3:21 PM

## 2015-07-26 ENCOUNTER — Inpatient Hospital Stay (HOSPITAL_COMMUNITY): Payer: Medicare Other | Admitting: Occupational Therapy

## 2015-07-26 ENCOUNTER — Inpatient Hospital Stay (HOSPITAL_COMMUNITY): Payer: Medicare Other | Admitting: Physical Therapy

## 2015-07-26 LAB — RENAL FUNCTION PANEL
ALBUMIN: 2.4 g/dL — AB (ref 3.5–5.0)
Anion gap: 13 (ref 5–15)
BUN: 29 mg/dL — AB (ref 6–20)
CALCIUM: 8.9 mg/dL (ref 8.9–10.3)
CO2: 24 mmol/L (ref 22–32)
Chloride: 100 mmol/L — ABNORMAL LOW (ref 101–111)
Creatinine, Ser: 6.55 mg/dL — ABNORMAL HIGH (ref 0.44–1.00)
GFR calc Af Amer: 6 mL/min — ABNORMAL LOW (ref >60)
GFR calc non Af Amer: 5 mL/min — ABNORMAL LOW (ref >60)
GLUCOSE: 226 mg/dL — AB (ref 65–99)
PHOSPHORUS: 4.5 mg/dL (ref 2.5–4.6)
Potassium: 4.8 mmol/L (ref 3.5–5.1)
SODIUM: 137 mmol/L (ref 135–145)

## 2015-07-26 LAB — CBC
HEMATOCRIT: 30.3 % — AB (ref 36.0–46.0)
Hemoglobin: 9.5 g/dL — ABNORMAL LOW (ref 12.0–15.0)
MCH: 31.5 pg (ref 26.0–34.0)
MCHC: 31.4 g/dL (ref 30.0–36.0)
MCV: 100.3 fL — ABNORMAL HIGH (ref 78.0–100.0)
Platelets: 323 10*3/uL (ref 150–400)
RBC: 3.02 MIL/uL — ABNORMAL LOW (ref 3.87–5.11)
RDW: 18.8 % — AB (ref 11.5–15.5)
WBC: 7.3 10*3/uL (ref 4.0–10.5)

## 2015-07-26 LAB — GLUCOSE, CAPILLARY
GLUCOSE-CAPILLARY: 78 mg/dL (ref 65–99)
GLUCOSE-CAPILLARY: 142 mg/dL — AB (ref 65–99)
Glucose-Capillary: 185 mg/dL — ABNORMAL HIGH (ref 65–99)

## 2015-07-26 MED ORDER — PENTAFLUOROPROP-TETRAFLUOROETH EX AERO: 1.0000 "application " | INHALATION_SPRAY | CUTANEOUS | Status: DC | PRN

## 2015-07-26 MED ORDER — SODIUM CHLORIDE 0.9 % IV SOLN: 100.0000 mL | INTRAVENOUS | Status: DC | PRN

## 2015-07-26 MED ORDER — LIDOCAINE HCL (PF) 1 % IJ SOLN: 5.0000 mL | INTRAMUSCULAR | Status: DC | PRN

## 2015-07-26 MED ORDER — HEPARIN SODIUM (PORCINE) 1000 UNIT/ML DIALYSIS: 1000.0000 [IU] | INTRAMUSCULAR | Status: DC | PRN

## 2015-07-26 MED ORDER — ALTEPLASE 2 MG IJ SOLR: 2.0000 mg | Freq: Once | INTRAMUSCULAR | Status: DC | PRN

## 2015-07-26 MED ORDER — HEPARIN SODIUM (PORCINE) 1000 UNIT/ML DIALYSIS: 20.0000 [IU]/kg | INTRAMUSCULAR | Status: DC | PRN

## 2015-07-26 MED ORDER — LIDOCAINE-PRILOCAINE 2.5-2.5 % EX CREA
1.0000 "application " | TOPICAL_CREAM | CUTANEOUS | Status: DC | PRN
  Filled 2015-07-26: qty 5

## 2015-07-26 NOTE — Progress Notes (Signed)
Occupational Therapy Session Note  Patient Details  Name: NELLIEL MARROW MRN: [id number] Date of Birth: 08/09/1928  Today's Date: 07/26/2015 OT Individual Time: 1300-1400 OT Individual Time Calculation (min): 60 min    Short Term Goals: Week 2:  OT Short Term Goal 1 (Week 2): STG=LTG due to LOS  Skilled Therapeutic Interventions/Progress Updates:    Pt seen for OT session focusing on functional mobility, standing balance, and activity tolerance. Pt sitting up in w/c upon arrival, voicing need for toileting task. She ambulated ~31ft into bathroom with RW and steadying assist. Toileting task completed with steadying assist. She self propelled w/c throughout unit with supervision, requiring rest breaks due to decreased activity tolerance.    In therapy day room, pt stood with RW to water plants, able to take steps to side to access plants with min A. Pt then completed UE strengthening exercises using level II theraband. Verbal, tactile and demonstrational cues provided for proper form and technique. Exercises completed x10 reps of diagonal up, diagonal down, chest expansion, and upright rows. Pt returned to room at end of session and returned to  Supine with supervision in prep for off unit procedure. Pt left in supine, all needs in reach.   Therapy Documentation Precautions:  Precautions Precautions: Fall Precaution Comments: HD MWF Required Braces or Orthoses: Knee Immobilizer - Right Knee Immobilizer - Right: On when out of bed or walking Restrictions Weight Bearing Restrictions: Yes RLE Weight Bearing: Non weight bearing Pain: Pain Assessment Pain Assessment: No/denies pain Pain Score: 0-No pain  See Function Navigator for Current Functional Status.   Therapy/Group: Individual Therapy  Lewis, Kha Hari C 07/26/2015, 2:47 PM

## 2015-07-26 NOTE — Progress Notes (Signed)
Physical Therapy Session Note  Patient Details  Name: [name] MRN: [id number] Date of Birth: 1928-11-30  Today's Date: 07/26/2015 PT Individual Time: 1104-1200 PT Individual Time Calculation (min): 56 min    Skilled Therapeutic Interventions/Progress Updates:    Pt received in w/c & agreeable to PT, denying c/o pain. First half of session focused on w/c propulsion from room to central unit & up/down ramp. Pt able to propel >200 ft with BUE & supervision, as well as negotiate ramp with supervision A & slowly descending ramp. Pt did require multiple rest breaks noting BUE fatigue. In Rehab apartment pt positioned w/c by bed, but required cuing to remove leg rests first & assistance to do so. Pt able to stand pivot w/c>bed with RW & steady A while maintaining NWB RLE with verbal cuing. Pt required steady A for RLE to transfer sit<>supine but was able to roll L<>R with supervision A. Gait training x 10 ft in apartment over carpeted floors with supervision/steady A & pt pushing up with BUE to advance LLE while maintaining NWB RLE. Pt required verbal encouragement/motivation to continue ambulating as pt fatigued quickly & would try to lean over on walker. Pt transferred chair>w/c via squat pivot with supervision. Throughout session pt adjusted R knee immobilizer due to it sliding down LE. At end of session pt left in w/c in room with all needs within reach.   Therapy Documentation Precautions:  Precautions Precautions: Fall Precaution Comments: HD MWF Required Braces or Orthoses: Knee Immobilizer - Right Knee Immobilizer - Right: On when out of bed or walking Restrictions Weight Bearing Restrictions: Yes RLE Weight Bearing: Non weight bearing  Pain: Pain Assessment Pain Assessment: No/denies pain Pain Score: 0-No pain  See Function Navigator for Current Functional Status.   Therapy/Group: Individual Therapy  Waunita Schooner 07/26/2015, 12:45 PM

## 2015-07-26 NOTE — Progress Notes (Signed)
[facility] Rounding Note  Subjective: Doing well this morning. No complaints.  Has some blistering of the RLE per notes  Objective: Vital signs in last 24 hours: Temp:  [98.1 F (36.7 C)-98.5 F (36.9 C)] 98.5 F (36.9 C) (05/10 0551) Pulse Rate:  [55-59] 59 (05/10 0551) Resp:  [16-18] 18 (05/10 0551) BP: (119-144)/(43-45) 144/43 mmHg (05/10 0551) SpO2:  [100 %] 100 % (05/10 0551) Weight:  [134 lb 0.6 oz (60.8 kg)] 134 lb 0.6 oz (60.8 kg) (05/10 0551) Weight change: -3 lb 1.4 oz (-1.4 kg)  General: alert sitting in chair, NAD Heart: S1S2 No S3 Lungs: CTA Abdomen: soft NT Extremities: No edema of LLE. RLE in immobilizer Dialysis Access: right upper AVF + bruit. Old non-functional AVG R forearm   Recent Labs  07/25/15 0040  NA 136  K 5.1  CL 98*  CO2 22  GLUCOSE 155*  BUN 46*  CREATININE 8.56*  CALCIUM 8.8*  PHOS 6.2*     Recent Labs  07/25/15 0040  ALBUMIN 2.4*    Scheduled Medications: . aspirin  325 mg Oral Daily  . darbepoetin (ARANESP) injection - DIALYSIS  60 mcg Intravenous Q Fri-HD  . diclofenac sodium  2 g Topical TID  . feeding supplement (GLUCERNA SHAKE)  237 mL Oral BID BM  . glimepiride  3 mg Oral Q breakfast  . heparin  5,000 Units Subcutaneous Q8H  . hydrALAZINE  50 mg Oral BID  . insulin aspart  0-24 Units Subcutaneous TID AC & HS  . isosorbide mononitrate  30 mg Oral Daily  . levothyroxine  75 mcg Oral QAC breakfast  . multivitamin  1 tablet Oral QHS  . nebivolol  10 mg Oral Daily  . pravastatin  20 mg Oral q1800  . sevelamer carbonate  800 mg Oral TID WC  . vitamin E  400 Units Oral Daily   Dialysis Orders: [facility] on MWF . EDW 64.5 HD  Bath 2K/2.5Ca  Time 3.5hrs  Heparin std.  Access RUE AVF  BFR 400 DFR 800   Assessment/Plan: Patient is an 82 year old female with history of ESRD on HD MWF admitted to inpatient rehab with a right tibia fracture s/p ORIF.  1. Right tib fx- s/p ORIF (07/07/15), working with  PT 2. ESRD - HD MWF (home unit [facility])  Has spot at [facility] MWF 2nd shift while staying in [place] after discharge. Will be staying with her daughter "for a couple of months". EDW will be lower at discharge - about 61 kg. Using tight heparin due to recent surgery. HD today after rehab. 1. Phos elevated - Will add sevelamer (previously had hypercalcemia - will avoid Ca based binders) 3. Anemia - hgb have been stable 9-11, Aranesp 60 q Fri started 4/28, had ferric gluconate 125 X 3 doses with HD, last dose 5/6.  1. Check CBC with HD today 4. HTN/volume - controlled  5. Nutrition - renal carb mod diet/vits/nepro 6. DM2 - meds have been adjusted by primary team 7. PAF - on ASA only 8.   LOS: 14 days   Dimas Chyle 07/26/2015,8:11 AM   I have seen and examined this patient and agree with plan and assessment in the above note. Rehab through Friday. Outpt HD arranged. Avory Mimbs B,MD 07/26/2015 1:03 PM

## 2015-07-26 NOTE — Progress Notes (Signed)
Occupational Therapy Session Note  Patient Details  Name: LELANI CADWELL MRN: [id number] Date of Birth: [birth year]  Today's Date: 07/26/2015 OT Individual Time: 0850-1000 OT Individual Time Calculation (min): 70 min    Short Term Goals: Week 2:  OT Short Term Goal 1 (Week 2): STG=LTG due to LOS  Skilled Therapeutic Interventions/Progress Updates:    Pt seen for OT ADL bathing/dressing session. Pt in supine upon arrival, agreeable to tx session. She transferred to EOB with increased time.  She completed bathing/dressing tasks from w/c level at sink. She stood with  Steadying assist to complete standing paricare/ buttock hygiene with VCs provided for standing balance posture and positioning to reach. ACE wrap donned to R LE for edema management. Pt completed sit <> stands throughout session with min A and steadying assist in standing for pt to complete clothing management. She required rest breaks during standing trials, toerating ~15 seconds of dynamic sitting before having to sit and rest prior to standing again. Grooming completed mod I at sink. Encouraged pt to stay sitting up in w/c at end of session. Left with all needs in reach.   Therapy Documentation Precautions:  Precautions Precautions: Fall Precaution Comments: HD MWF Required Braces or Orthoses: Knee Immobilizer - Right Knee Immobilizer - Right: On when out of bed or walking Restrictions Weight Bearing Restrictions: Yes RLE Weight Bearing: Non weight bearing Pain:   No/ denies pain  See Function Navigator for Current Functional Status.   Therapy/Group: Individual Therapy  Lewis, Kaisley Stiverson C 07/26/2015, 7:12 AM

## 2015-07-26 NOTE — Procedures (Signed)
I have personally attended this patient's dialysis session.   HR in 40's but BP OK  (tends to be brady 50's usually) L AVG 400 Hb 9.5 No chemistries as of yet  Jamal Maes, MD [facility] [phone] Pager 07/26/2015, 3:28 PM

## 2015-07-26 NOTE — Progress Notes (Signed)
Nutrition Follow-up  DOCUMENTATION CODES:   Not applicable  INTERVENTION:  Continue Glucerna Shake po BID, each supplement provides 220 kcal and 10 grams of protein.  Encourage adequate PO intake.   NUTRITION DIAGNOSIS:   Increased nutrient needs related to chronic illness as evidenced by estimated needs; ongoing  GOAL:   Patient will meet greater than or equal to 90% of their needs; met  MONITOR:   PO intake, Supplement acceptance, Weight trends, Labs, I & O's  REASON FOR ASSESSMENT:   Malnutrition Screening Tool    ASSESSMENT:   82 year old right-handed female well known to rehabilitation services for history of right pilon fracture received inpatient rehabilitation services 2 years ago and was discharged home with daughter. Past medical history of end-stage renal disease with hemodialysis, diabetes mellitus, left breast cancer, PAF maintained on aspirin 81 mg, chronic diastolic congestive heart failure. Presented 07/05/2015 [place] after mechanical fall without loss of consciousness. Sustained comminuted fracture right tibia-fibula. Underwent ORIF 07/07/2015  Meal completion has been varied from 30-100% with most intake 75-100%. Pt reports appetite has improved. Pt currently has Glucerna Shake ordered and has been consuming them. RD to continue with current orders. Pt encouraged to eat her food at meals and to drink her supplements.   Phosphorous elevated at 6.2.  Diet Order:  Diet renal/carb modified with fluid restriction Diet-HS Snack?: Nothing; Room service appropriate?: Yes; Fluid consistency:: Thin  Skin:  Wound (see comment) (DTI on R heel, incision on R tibial)  Last BM:  5/8  Height:   Ht Readings from Last 1 Encounters:  07/12/15 _0  (1.549 m)    Weight:   Wt Readings from Last 1 Encounters:  07/26/15 134 lb 0.6 oz (60.8 kg)    Ideal Body Weight:  47.7 kg  BMI:  Body mass index is 25.34  kg/(m^2).  Estimated Nutritional Needs:   Kcal:  1650-1850  Protein:  75-85 grams  Fluid:  1.2 L/day  EDUCATION NEEDS:   No education needs identified at this time  Corrin Parker, MS, RD, LDN Pager # [phone] After hours/ weekend pager # [phone]

## 2015-07-26 NOTE — Progress Notes (Signed)
[name] PHYSICAL MEDICINE & REHABILITATION     PROGRESS NOTE    Subjective/Complaints: Feeling well. Swelling in right leg better.     ROS: Denies CP, SOB, nausea, vomiting, diarrhea.  Objective: Vital Signs: Blood pressure 144/43, pulse 59, temperature 98.5 F (36.9 C), temperature source Oral, resp. rate 18, height 5\' 1"  (1.549 m), weight 60.8 kg (134 lb 0.6 oz), SpO2 100 %. No results found. No results for input(s): WBC, HGB, HCT, PLT in the last 72 hours.  Recent Labs  07/25/15 0040  NA 136  K 5.1  CL 98*  GLUCOSE 155*  BUN 46*  CREATININE 8.56*  CALCIUM 8.8*   CBG (last 3)   Recent Labs  07/25/15 1646 07/25/15 2121 07/26/15 0635  GLUCAP 204* 123* 78    Wt Readings from Last 3 Encounters:  07/26/15 60.8 kg (134 lb 0.6 oz)  07/11/15 66.225 kg (146 lb)  01/06/14 66.996 kg (147 lb 11.2 oz)    Physical Exam:  Constitutional: She appears well-developed and well-nourished. NAD. HENT: Normocephalic and atraumatic.  Eyes: Conjunctivae and EOM are normal.  Cardiovascular: Irregular irregular. RUE fistula  Respiratory: Effort normal and breath sounds normal. No respiratory distress.  GI: Soft. Bowel sounds are normal. She exhibits no distension.  Musculoskeletal: She exhibits edema and tenderness RLE---tr to 1+   Neurological: She is alert and oriented.  Motor: B/l UE 4/5 proximal to distal LLE: 3 proximally, 4/5 distally RLE: hip flexion 2/5, wiggles toes  Skin:  Right leg wounds clean/dry with Steri-Strips, area of eschar on patella Appears to be healing with pink granulation tissue around the edges.  Heel with healing blister Psychiatric: She has a normal mood and affect. Her behavior is normal.     Assessment/Plan: 1. Functional deficits secondary to right tib/fib fx which require 3+ hours per day of interdisciplinary therapy in a comprehensive inpatient rehab setting. Physiatrist is providing close team supervision and 24 hour management of active  medical problems listed below. Physiatrist and rehab team continue to assess barriers to discharge/monitor patient progress toward functional and medical goals.  Function:  Bathing Bathing position   Position: Wheelchair/chair at sink (Peri-area and buttock completed sitting on toilet)  Bathing parts Body parts bathed by patient: Right arm, Left arm, Chest, Abdomen, Front perineal area, Buttocks, Right upper leg, Left lower leg, Left upper leg, Right lower leg Body parts bathed by helper: Back  Bathing assist Assist Level: Touching or steadying assistance(Pt > 75%)      Upper Body Dressing/Undressing Upper body dressing   What is the patient wearing?: Pull over shirt/dress, Bra Bra - Perfomed by patient: Thread/unthread right bra strap, Thread/unthread left bra strap Bra - Perfomed by helper: Hook/unhook bra (pull down sports bra) Pull over shirt/dress - Perfomed by patient: Thread/unthread right sleeve, Thread/unthread left sleeve, Put head through opening, Pull shirt over trunk Pull over shirt/dress - Perfomed by helper: Pull shirt over trunk        Upper body assist Assist Level: Set up   Set up : To obtain clothing/put away  Lower Body Dressing/Undressing Lower body dressing   What is the patient wearing?: Underwear, Pants, Non-skid slipper socks Underwear - Performed by patient: Thread/unthread right underwear leg, Thread/unthread left underwear leg, Pull underwear up/down Underwear - Performed by helper: Pull underwear up/down Pants- Performed by patient: Thread/unthread right pants leg, Thread/unthread left pants leg, Pull pants up/down Pants- Performed by helper: Pull pants up/down Non-skid slipper socks- Performed by patient: Don/doff left sock (R N/A)  Non-skid slipper socks- Performed by helper: Don/doff right sock, Don/doff left sock Socks - Performed by patient: Don/doff left sock Socks - Performed by helper: Don/doff right sock Shoes - Performed by patient: Don/doff  left shoe Shoes - Performed by helper: Fasten left, Don/doff right shoe          Lower body assist Assist for lower body dressing: Touching or steadying assistance (Pt > 75%)      Toileting Toileting Toileting activity did not occur: N/A Toileting steps completed by patient: Adjust clothing prior to toileting, Performs perineal hygiene, Adjust clothing after toileting Toileting steps completed by helper: Adjust clothing after toileting, Adjust clothing prior to toileting Toileting Assistive Devices: Grab bar or rail  Toileting assist Assist level: Touching or steadying assistance (Pt.75%)   Transfers Chair/bed transfer   Chair/bed transfer method: Lateral scoot Chair/bed transfer assist level: Touching or steadying assistance (Pt > 75%) Chair/bed transfer assistive device: Walker, Orthosis     Locomotion Ambulation Ambulation activity did not occur: Safety/medical concerns (unsafe on eval)   Max distance: 5' Assist level: Touching or steadying assistance (Pt > 75%)   Wheelchair   Type: Manual Max wheelchair distance: 200' Assist Level: Supervision or verbal cues  Cognition Comprehension Comprehension assist level: Understands basic 90% of the time/cues < 10% of the time  Expression Expression assist level: Expresses basic 90% of the time/requires cueing < 10% of the time.  Social Interaction Social Interaction assist level: Interacts appropriately 90% of the time - Needs monitoring or encouragement for participation or interaction.  Problem Solving Problem solving assist level: Solves basic problems with no assist  Memory Memory assist level: Recognizes or recalls 90% of the time/requires cueing < 10% of the time    Medical Problem List and Plan: 1. Limited functional mobility secondary to comminuted right tibia-fibula fracture. Status post ORIF 07/07/2015. Nonweightbearing. Knee immobilizer when out of bed. No ROM precautions  -continue CIR therapies 2. DVT  Prophylaxis/Anticoagulation: Subcutaneous heparin. Monitor platelet counts and any signs of bleeding 3. Pain Management: tylenol only due to cognitve side effects (family requested)  - voltaren gel for bilateral hand OA 4. Acute blood loss anemia. hgb 9.9. Fe++ supp per nephrology 5. Neuropsych: This patient is capable of making decisions on her own behalf. At baseline 6. Skin/Wound Care: can change right leg dressing as needed  -dressing/pressure relief to areas of blistering.   -incisions looks great overall  -can ACE wrap RLE PRN 7. Fluids/Electrolytes/Nutrition:  8. End-stage renal disease with hemodialysis. HD after therapy. 9. PAF. Aspirin 325 mg daily. Cardiac rate control 10. Hypertension. Hydralazine 50 mg twice a day, Imdur 30 mg daily 11. Diastolic congestive heart failure. Monitor for any signs of fluid overload 12. Diabetes mellitus. .      -increased am amaryl to 3mg ---may need further titration once home.   -reduced SSI again as she tends to rebound from rx of high or low cbg's.    - Stabilizing   CBG (last 3)   Recent Labs  07/25/15 1646 07/25/15 2121 07/26/15 0635  GLUCAP 204* 123* 78   13. Hypothyroidism. Synthroid 75 g daily 14. Hyperlipidemia. Pravachol 15.  Leukocytosis but afebrile, incisions look ok.   CBC Latest Ref Rng 07/19/2015 07/17/2015 07/16/2015  WBC 4.0 - 10.5 K/uL 12.9(H) 12.5(H) 14.2(H)  Hemoglobin 12.0 - 15.0 g/dL 9.9(L) 9.5(L) 9.8(L)  Hematocrit 36.0 - 46.0 % 29.6(L) 29.0(L) 30.3(L)  Platelets 150 - 400 K/uL 394 391 385   LOS (Days) 14 A FACE TO FACE  EVALUATION WAS PERFORMED  SWARTZ,ZACHARY T 07/26/2015 8:44 AM

## 2015-07-26 NOTE — Progress Notes (Signed)
Social Work Patient ID: [name], female   DOB: 04/27/1928, 82 y.o.   MRN: [id number]   Met with pt and daughter Tuesday afternoon to review team conference.  Both feeling ready for end of week d/c.  Daughter notes ramp is almost complete and will be done by d/c.  Discussed need for family ed with daughter and caregiver, however, daughter doubtful that caregiver can come in.  Daughter later confirms that she can be here tomorrow for 3:00 PT session.  I am arranging f/u [facility] and DME via [facility].  Sussie Minor, LCSW

## 2015-07-27 ENCOUNTER — Inpatient Hospital Stay (HOSPITAL_COMMUNITY): Payer: Medicare Other | Admitting: Occupational Therapy

## 2015-07-27 ENCOUNTER — Inpatient Hospital Stay (HOSPITAL_COMMUNITY): Payer: Medicare Other

## 2015-07-27 LAB — GLUCOSE, CAPILLARY
GLUCOSE-CAPILLARY: 128 mg/dL — AB (ref 65–99)
Glucose-Capillary: 78 mg/dL (ref 65–99)
Glucose-Capillary: 212 mg/dL — ABNORMAL HIGH (ref 65–99)
Glucose-Capillary: 128 mg/dL — ABNORMAL HIGH (ref 65–99)

## 2015-07-27 NOTE — Progress Notes (Signed)
Occupational Therapy Discharge Summary  Patient Details  Name: [name] MRN: [id number] Date of Birth: 09/26/1928   Patient has met 9 of 9 long term goals due to improved activity tolerance, improved balance, postural control, ability to compensate for deficits, improved awareness and improved coordination.  Patient to discharge at overall Min Assist level.  Patient's care partner is independent to provide the necessary physical assistance at discharge.    Pt made excellent progress during CIR stay. She currently requires min A with all standing tasks and transfers. She requires frequent cuing for proper technique and safety awareness during transfers, to have knee immobilizer donned whenever OOB and standing tasks due to decreased carry over of education. Recommending 24 hr supervision.   Pt's family unable to attend OT famil education, however, per CSW pt's daughter feels ready to provide needed assistance for pt as she cared for pt following last rehab admission with diagnosis of broken tib/fib. PT to complete family education and educate regarding proper technique and assist required for stand/ stand pivot and functional transfers.    Recommendation:  Patient will benefit from ongoing skilled OT services in home health setting to continue to advance functional skills in the area of BADL, iADL and Reduce care partner burden.  Equipment: No equipment provided. Family reports having BSC and tub bench.  Reasons for discharge: treatment goals met and discharge from hospital  Patient/family agrees with progress made and goals achieved: Yes  OT Discharge Precautions/Restrictions  Precautions Precautions: Fall Precaution Comments: HD MWF Required Braces or Orthoses: Knee Immobilizer - Right Knee Immobilizer - Right: On when out of bed or walking Restrictions Weight Bearing Restrictions: Yes RLE Weight Bearing: Non weight bearing Vision/Perception  Vision- History Baseline  Vision/History: Wears glasses Wears Glasses: At all times Patient Visual Report: No change from baseline Vision- Assessment Vision Assessment?: No apparent visual deficits  Cognition Arousal/Alertness: Awake/alert Orientation Level: Oriented X4 Memory: Impaired Memory Impairment: Decreased recall of new information Decreased Short Term Memory: Verbal complex;Functional complex Problem Solving: Appears intact Safety/Judgment: Appears intact Comments: Cognition has improved since admission, however, still recommend 24 hr supervision assistance at d/c.  Sensation Sensation Light Touch: Appears Intact Coordination Gross Motor Movements are Fluid and Coordinated: No Coordination and Movement Description: Decreased balance due to R NWB precautions Motor  Motor Motor: Other (comment) Motor - Discharge Observations: Generalized weakness however, much improved since admission with pain better managed  Trunk/Postural Assessment  Cervical Assessment Cervical Assessment: Within Functional Limits Thoracic Assessment Thoracic Assessment: Exceptions to WFL (Kyphotic) Lumbar Assessment Lumbar Assessment: Exceptions to WFL (Posterior pelvic tilt) Postural Control Postural Control: Deficits on evaluation (Slight L lean due to R NWB, however, much improved since admission)  Balance Balance Balance Assessed: Yes Static Sitting Balance Static Sitting - Level of Assistance: 6: Modified independent (Device/Increase time) Dynamic Sitting Balance Dynamic Sitting - Balance Support: Feet unsupported Dynamic Sitting - Level of Assistance: 6: Modified independent (Device/Increase time);5: Stand by assistance Static Standing Balance Static Standing - Balance Support: Bilateral upper extremity supported Static Standing - Level of Assistance: 5: Stand by assistance Dynamic Standing Balance Dynamic Standing - Balance Support: During functional activity;Right upper extremity supported;Left upper  extremity supported Dynamic Standing - Level of Assistance: 4: Min assist Dynamic Standing - Comments: Standing to complete LB dressing tasks and toileting Extremity/Trunk Assessment RUE Assessment RUE Assessment: Within Functional Limits (4/5 overall) LUE Assessment LUE Assessment: Within Functional Limits (4/5 overalll)   See Function Navigator for Current Functional Status.  Lewis, Amy C   07/27/2015, 8:10 AM  

## 2015-07-27 NOTE — Discharge Summary (Signed)
Discharge summary job # [id number]

## 2015-07-27 NOTE — Progress Notes (Signed)
[facility] Rounding Note  Subjective: Doing well this morning. No complaints. Will be discharged after HD tomorrow.   Objective: Vital signs in last 24 hours: Temp:  [98.1 F (36.7 C)-98.8 F (37.1 C)] 98.8 F (37.1 C) (05/11 0654) Pulse Rate:  [45-67] 54 (05/11 0654) Resp:  [17-18] 17 (05/11 0654) BP: (95-141)/(33-72) 127/42 mmHg (05/11 0654) Weight:  [134 lb 7.7 oz (61 kg)-137 lb 12.6 oz (62.5 kg)] 136 lb 3.9 oz (61.8 kg) (05/11 0654) Weight change: 3 lb 12 oz (1.7 kg)  General: alert sitting in chair, NAD Heart: S1S2 No S3 Lungs: CTA Abdomen: soft NT Extremities: No edema of LLE. RLE in immobilizer In bed when I saw - immobilizer off - leg ace wrapped to the knee. Area of reported eschar on patella with some drainage on the 2X2 dressing, R foot pedal edena Dialysis Access: right upper AVF + bruit. Old non-functional AVG R forearm   Recent Labs  07/25/15 0040 07/26/15 1457  NA 136 137  K 5.1 4.8  CL 98* 100*  CO2 22 24  GLUCOSE 155* 226*  BUN 46* 29*  CREATININE 8.56* 6.55*  CALCIUM 8.8* 8.9  PHOS 6.2* 4.5     Recent Labs  07/25/15 0040 07/26/15 1457  ALBUMIN 2.4* 2.4*    Scheduled Medications: . aspirin  325 mg Oral Daily  . darbepoetin (ARANESP) injection - DIALYSIS  60 mcg Intravenous Q Fri-HD  . diclofenac sodium  2 g Topical TID  . feeding supplement (GLUCERNA SHAKE)  237 mL Oral BID BM  . glimepiride  3 mg Oral Q breakfast  . heparin  5,000 Units Subcutaneous Q8H  . hydrALAZINE  50 mg Oral BID  . insulin aspart  0-24 Units Subcutaneous TID AC & HS  . isosorbide mononitrate  30 mg Oral Daily  . levothyroxine  75 mcg Oral QAC breakfast  . multivitamin  1 tablet Oral QHS  . nebivolol  10 mg Oral Daily  . pravastatin  20 mg Oral q1800  . sevelamer carbonate  800 mg Oral TID WC  . vitamin E  400 Units Oral Daily   Dialysis Orders: [facility] on MWF . EDW 64.5 HD  Bath 2K/2.5Ca  Time 3.5hrs  Heparin std.  Access RUE AVF  BFR 400 DFR  800   Assessment/Plan: Patient is an 82 year old female with history of ESRD on HD MWF admitted to inpatient rehab with a right tibia fracture s/p ORIF.  1. Right tib fx- s/p ORIF (07/07/15), working with PT 2. ESRD - HD MWF (home unit [facility])  Has spot at [facility] MWF 2nd shift while staying in [place] after discharge. Will be staying with her daughter "for a couple of months". EDW will be lower at discharge - about 61 kg. Using tight heparin due to recent surgery.  1. Phos elevated - Have added sevelamer (previously had hypercalcemia - will avoid Ca based binders) 3. Anemia - hgbs have been stable 9-11, Aranesp 60 q Fri started 4/28, had ferric gluconate 125 X 3 doses with HD, last dose 5/6.  4. HTN/volume - controlled  5. Nutrition - renal carb mod diet/vits/nepro 6. DM2 - meds have been adjusted by primary team 7. PAF - on ASA only   LOS: 15 days   Dimas Chyle 07/27/2015,8:31 AM  I have seen and examined this patient and agree with plan and assessment in the above note with highlighted addition. Haron Beilke B,MD 07/27/2015 12:13 PM

## 2015-07-27 NOTE — Progress Notes (Signed)
Physical Therapy Session Note  Patient Details  Name: [name] MRN: [id number] Date of Birth: 08/06/1928  Today's Date: 07/27/2015 PT Individual Time: 0915-1010 PT Individual Time Calculation (min): 55 min   Short Term Goals: Week 2:  PT Short Term Goal 1 (Week 2): = LTGs  Skilled Therapeutic Interventions/Progress Updates:    Session focused on grad day activities and preparing for d/c. Pt performed various transfers during session including regular bed, hospital bed, and simulated car transfer, w/c mobility in controlled and home environment settings with supervision, short distance gait with RW for functional mobility training, bed mobility (pt able to do with supervision today manaing RLE), and UE therex for functional strengthening and cardiovascular endurance on SCI Fit machine. Pt performed transfers with supervision to steadying assist and extra time - requires set-up assist for w/c parts management and cues for technique. Random program on level 3 on SCI Fit x 4 min and 30 seconds until fatigued. End of session returned back to bed to rest with all needs in reach.   Therapy Documentation Precautions:  Precautions Precautions: Fall Precaution Comments: HD MWF Required Braces or Orthoses: Knee Immobilizer - Right Knee Immobilizer - Right: On when out of bed or walking Restrictions Weight Bearing Restrictions: Yes RLE Weight Bearing: Non weight bearing  Pain: Denies pain.  See Function Navigator for Current Functional Status.   Therapy/Group: Individual Therapy  Canary Brim Ivory Broad, PT, DPT  07/27/2015, 10:18 AM

## 2015-07-27 NOTE — Progress Notes (Signed)
Physical Therapy Discharge Summary  Patient Details  Name: [name] MRN: [id number] Date of Birth: 01-Dec-1928  Today's Date: 07/27/2015 PT Individual Time: 1457-1535 PT Individual Time Calculation (min): 38 min  Denies pain. Session focused on family education with pt and pt's daughter, [name] in regards to d/c for tomorrow. Demonstrated basic transfers, parts management on w/c, standing balance, and educated on car transfer (SUV vs sedan and front vs back seat), toilet transfers, tub bench, and HEP for BLE strengthening. Demonstrated ACE wrap technique and wear schedule for KI on RLE and education on edema control and positioning as well as use of ice as well. Handout for exercises reviewed with daughter. Pt performed transfers at close supervision to steady assist during session and declined practicing anything else at this time. Daughter feels confident with mobility needs. End of session removed KI and ACE wrap and elevated with pillow while pt up in recliner.   Patient has met 7 of 7 long term goals due to improved activity tolerance, improved balance, improved postural control, increased strength, increased range of motion, decreased pain, ability to compensate for deficits, improved attention and improved awareness.  Patient to discharge at a wheelchair level [place].   Patient's daughter is independent to provide the necessary physical and supervision assistance at discharge and will train caregivers.  Reasons goals not met: n/a - all goals met at this time  Recommendation:  Patient will benefit from ongoing skilled PT services in home health setting to continue to advance safe functional mobility, address ongoing impairments in strength, balance, weightbearing restrictions, endurance, functional mobility, and minimize fall risk.  Equipment: No equipment provided. Pt already has RW and w/c. Recommended getting ELR for current w/c through [facility].  Reasons for discharge:  treatment goals met and discharge from hospital  Patient/family agrees with progress made and goals achieved: Yes  PT Discharge Precautions/Restrictions Precautions Precautions: Fall Precaution Comments: HD MWF Required Braces or Orthoses: Knee Immobilizer - Right Knee Immobilizer - Right: On when out of bed or walking Restrictions Weight Bearing Restrictions: Yes RLE Weight Bearing: Non weight bearing    Cognition Overall Cognitive Status: Impaired/Different from baseline Arousal/Alertness: Awake/alert Orientation Level: Oriented X4 Memory: Impaired Memory Impairment: Decreased recall of new information Decreased Short Term Memory: Verbal complex;Functional complex Problem Solving: Impaired Problem Solving Impairment: Functional complex Safety/Judgment: Appears intact Comments: Cognition has improved since admission, however, still recommend 24 hr supervision assistance at d/c.  Sensation Sensation Light Touch: Appears Intact Proprioception: Appears Intact Coordination Gross Motor Movements are Fluid and Coordinated: No (RLE) Coordination and Movement Description: Decreased balance due to R NWB precautions Motor  Motor Motor: Other (comment) (generalized post op pain and weakness in RLE) Motor - Discharge Observations: Generalized weakness however, much improved since admission with pain better managed     Trunk/Postural Assessment  Cervical Assessment Cervical Assessment: Within Functional Limits Thoracic Assessment Thoracic Assessment: Exceptions to [facility] (kyphotic) Lumbar Assessment Lumbar Assessment: Exceptions to [facility] (posterior pelvic tilt) Postural Control Postural Control: Deficits on evaluation (occasional L lateral lean and posterior lean due to NWB stat)  Balance Balance Balance Assessed: Yes Static Sitting Balance Static Sitting - Level of Assistance: 6: Modified independent (Device/Increase time) Dynamic Sitting Balance Dynamic Sitting - Balance  Support: Feet unsupported Dynamic Sitting - Level of Assistance: 6: Modified independent (Device/Increase time);5: Stand by assistance Static Standing Balance Static Standing - Balance Support: Bilateral upper extremity supported Static Standing - Level of Assistance: 5: Stand by assistance Dynamic Standing Balance Dynamic Standing -  Balance Support: During functional activity;Right upper extremity supported;Left upper extremity supported Dynamic Standing - Level of Assistance: 4: Min assist Dynamic Standing - Comments: Standing to complete LB dressing tasks and toileting Extremity Assessment  RUE Assessment RUE Assessment: Within Functional Limits (4/5 overall) LUE Assessment LUE Assessment: Within Functional Limits (4/5 overalll) RLE Assessment RLE Assessment: Exceptions to [facility] RLE Strength RLE Overall Strength Comments: 2/5 at knee; ankle WFL; 3-/5 at hip; pain limiting overall movement LLE Assessment LLE Assessment: Within Functional Limits (grossly 4/5)   See Function Navigator for Current Functional Status.  Canary Brim Ivory Broad, PT, DPT  07/27/2015, 3:51 PM

## 2015-07-27 NOTE — Progress Notes (Signed)
[facility] PHYSICAL MEDICINE & REHABILITATION     PROGRESS NOTE    Subjective/Complaints: Up with OT. No new issues today   ROS: Denies CP, SOB, nausea, vomiting, diarrhea.  Objective: Vital Signs: Blood pressure 127/42, pulse 54, temperature 98.8 F (37.1 C), temperature source Oral, resp. rate 17, height 5\' 1"  (1.549 m), weight 61.8 kg (136 lb 3.9 oz), SpO2 100 %. No results found.  Recent Labs  07/26/15 1458  WBC 7.3  HGB 9.5*  HCT 30.3*  PLT 323    Recent Labs  07/25/15 0040 07/26/15 1457  NA 136 137  K 5.1 4.8  CL 98* 100*  GLUCOSE 155* 226*  BUN 46* 29*  CREATININE 8.56* 6.55*  CALCIUM 8.8* 8.9   CBG (last 3)   Recent Labs  07/26/15 1157 07/26/15 2040 07/27/15 0647  GLUCAP 185* 142* 78    Wt Readings from Last 3 Encounters:  07/27/15 61.8 kg (136 lb 3.9 oz)  07/11/15 66.225 kg (146 lb)  01/06/14 66.996 kg (147 lb 11.2 oz)    Physical Exam:  Constitutional: She appears well-developed and well-nourished. NAD. HENT: Normocephalic and atraumatic.  Eyes: Conjunctivae and EOM are normal.  Cardiovascular: Irregular irregular. RUE fistula  Respiratory: Effort normal and breath sounds normal. No respiratory distress.  GI: Soft. Bowel sounds are normal. She exhibits no distension.  Musculoskeletal: She exhibits edema and tenderness RLE---tr to 1+   Neurological: She is alert and oriented.  Motor: B/l UE 4/5 proximal to distal LLE: 3 proximally, 4/5 distally RLE: hip flexion 2/5, wiggles toes  Skin:  Right leg wounds clean/dry with Steri-Strips, area of eschar on patella Appears to be healing with granulation tissue around the edges.  Heel with healing blister Psychiatric: She has a normal mood and affect. Her behavior is normal.     Assessment/Plan: 1. Functional deficits secondary to right tib/fib fx which require 3+ hours per day of interdisciplinary therapy in a comprehensive inpatient rehab setting. Physiatrist is providing close team  supervision and 24 hour management of active medical problems listed below. Physiatrist and rehab team continue to assess barriers to discharge/monitor patient progress toward functional and medical goals.  Function:  Bathing Bathing position   Position: Wheelchair/chair at sink  Bathing parts Body parts bathed by patient: Right arm, Left arm, Chest, Abdomen, Front perineal area, Buttocks, Right upper leg, Left lower leg, Left upper leg, Right lower leg Body parts bathed by helper: Back  Bathing assist Assist Level: Touching or steadying assistance(Pt > 75%)      Upper Body Dressing/Undressing Upper body dressing   What is the patient wearing?: Pull over shirt/dress, Bra Bra - Perfomed by patient: Thread/unthread right bra strap, Thread/unthread left bra strap, Hook/unhook bra (pull down sports bra) Bra - Perfomed by helper: Hook/unhook bra (pull down sports bra) Pull over shirt/dress - Perfomed by patient: Thread/unthread right sleeve, Thread/unthread left sleeve, Put head through opening, Pull shirt over trunk Pull over shirt/dress - Perfomed by helper: Pull shirt over trunk        Upper body assist Assist Level: Set up   Set up : To obtain clothing/put away  Lower Body Dressing/Undressing Lower body dressing   What is the patient wearing?: Underwear, Pants, Non-skid slipper socks Underwear - Performed by patient: Thread/unthread right underwear leg, Thread/unthread left underwear leg, Pull underwear up/down Underwear - Performed by helper: Pull underwear up/down Pants- Performed by patient: Thread/unthread right pants leg, Thread/unthread left pants leg, Pull pants up/down Pants- Performed by helper: Pull pants  up/down Non-skid slipper socks- Performed by patient: Don/doff right sock Non-skid slipper socks- Performed by helper: Don/doff right sock Socks - Performed by patient: Don/doff left sock Socks - Performed by helper: Don/doff right sock Shoes - Performed by patient:  Don/doff left shoe Shoes - Performed by helper: Fasten left, Don/doff right shoe          Lower body assist Assist for lower body dressing: Touching or steadying assistance (Pt > 75%)      Toileting Toileting Toileting activity did not occur: N/A Toileting steps completed by patient: Adjust clothing prior to toileting, Performs perineal hygiene, Adjust clothing after toileting Toileting steps completed by helper: Adjust clothing after toileting, Adjust clothing prior to toileting Toileting Assistive Devices: Grab bar or rail  Toileting assist Assist level: Touching or steadying assistance (Pt.75%)   Transfers Chair/bed transfer   Chair/bed transfer method: Stand pivot Chair/bed transfer assist level: Touching or steadying assistance (Pt > 75%) Chair/bed transfer assistive device: Medical sales representative Ambulation activity did not occur: Safety/medical concerns (unsafe on eval)   Max distance: 10 ft Assist level: Touching or steadying assistance (Pt > 75%)   Wheelchair   Type: Manual Max wheelchair distance: >200 ft Assist Level: Supervision or verbal cues  Cognition Comprehension Comprehension assist level: Understands basic 90% of the time/cues < 10% of the time  Expression Expression assist level: Expresses basic 90% of the time/requires cueing < 10% of the time.  Social Interaction Social Interaction assist level: Interacts appropriately 90% of the time - Needs monitoring or encouragement for participation or interaction.  Problem Solving Problem solving assist level: Solves basic problems with no assist  Memory Memory assist level: Recognizes or recalls 90% of the time/requires cueing < 10% of the time    Medical Problem List and Plan: 1. Limited functional mobility secondary to comminuted right tibia-fibula fracture. Status post ORIF 07/07/2015. Nonweightbearing. Knee immobilizer when out of bed. No ROM precautions  -continue CIR therapies  -finalizing dc  planning for tomorrow 2. DVT Prophylaxis/Anticoagulation: Subcutaneous heparin. Monitor platelet counts and any signs of bleeding 3. Pain Management: tylenol only due to cognitve side effects (family requested)  - voltaren gel for bilateral hand OA 4. Acute blood loss anemia. hgb 9.9. Fe++ supp per nephrology 5. Neuropsych: This patient is capable of making decisions on her own behalf. At baseline 6. Skin/Wound Care: can change right leg dressing as needed  -dressing/pressure relief to areas of blistering.   -incisions looks great overall  -can ACE wrap RLE PRN 7. Fluids/Electrolytes/Nutrition:  8. End-stage renal disease with hemodialysis. HD after therapy. 9. PAF. Aspirin 325 mg daily. Cardiac rate control 10. Hypertension. Hydralazine 50 mg twice a day, Imdur 30 mg daily 11. Diastolic congestive heart failure. Monitor for any signs of fluid overload 12. Diabetes mellitus. .      -increased am amaryl to 3mg ---improving sugars. Increase to 4mg  at home?  -reduced SSI again as she tends to rebound from rx of high or low cbg's.    - Stabilizing   CBG (last 3)   Recent Labs  07/26/15 1157 07/26/15 2040 07/27/15 0647  GLUCAP 185* 142* 78   13. Hypothyroidism. Synthroid 75 g daily 14. Hyperlipidemia. Pravachol 15.  Leukocytosis but afebrile, incisions look ok.   CBC Latest Ref Rng 07/26/2015 07/19/2015 07/17/2015  WBC 4.0 - 10.5 K/uL 7.3 12.9(H) 12.5(H)  Hemoglobin 12.0 - 15.0 g/dL 9.5(L) 9.9(L) 9.5(L)  Hematocrit 36.0 - 46.0 % 30.3(L) 29.6(L) 29.0(L)  Platelets 150 -  400 K/uL 323 394 391   LOS (Days) 15 A FACE TO FACE EVALUATION WAS PERFORMED  SWARTZ,ZACHARY T 07/27/2015 8:02 AM

## 2015-07-27 NOTE — Progress Notes (Signed)
Occupational Therapy Session Note  Patient Details  Name: [name] MRN: [id number] Date of Birth: 1928-11-14  Today's Date: 07/27/2015 OT Individual Time: [id number] and 1300-1400 OT Individual Time Calculation (min): 60 min and 60 min   Short Term Goals: Week 2:  OT Short Term Goal 1 (Week 2): STG=LTG due to LOS  Skilled Therapeutic Interventions/Progress Updates:    Session One: Pt seen for OT ADL bathing/dressing session. Pt in supine upon arrival, agreeable to tx session. She transferred to EOB mod I and completed stand pivot transfer to w/c with close supervision.  Bathing/dressing completed from w/c level at sink.  She demonstrated improved ability to manage w/c parts and sequecing of steps to prepare w/c to stand, however, required cues not to stand without someone right next to her as she felt standing at sink by herself would be safe. Educated regarding fall risk and need for someone to be with here at all times during sit<> stands and transfers.   She completed sit <> stands throughout session with steadying assist to complete uttock/ pericare hygiene and for LB clothing management. ACE wrap and knee immobilizer donned total A. Grooming completed mod I at sink. Pt left sitting in w/c at end of session, all needs in reach   Session Two: Pt seen for OT session focusing on functional mobility and transfers. Pt resting in supine upon arrival agreeable to tx session. ACE wrap and knee immobilizer donned total A in supine. She transferred to EOB and completed min A stand pivot to w/c. She ambulated ~3 ft into bathroom and completed toileting task with steadying assist.    She self propelled w/c to ADL apartment for UE strengthening and endurance. In apartment completed stand pivot transfers to EOB and to low soft surface couch. She completed sit <> stand from bed with min A, however, required mod A to stand from low couch with VCs for "nose over toes" technique.  She completed simulated  tub bench transfer with min A and VCs for sequencing. Throughout session pt required VCs for proper set-up of w/c in prep for transfers and to reach with UE when sitting. Pt returned to room at end of session, transferred to recliner and left sitting in recliner with all needs in reach.  Educated regarding use of BSC for night time use to decrease risk of falls, pt voiced understanding, however, not too concerned due to being dialysis pt and having BMs during the day. Cont to recommend 24 hr supervision- min A due to decreased carryover of education and need for steadying assist with standing and transfers.   Therapy Documentation Precautions:  Precautions Precautions: Fall Precaution Comments: HD MWF Required Braces or Orthoses: Knee Immobilizer - Right Knee Immobilizer - Right: On when out of bed or walking Restrictions Weight Bearing Restrictions: Yes RLE Weight Bearing: Non weight bearing Pain:   No/ denies pain  See Function Navigator for Current Functional Status.   Therapy/Group: Individual Therapy  Lewis, Malissie Musgrave C 07/27/2015, 7:07 AM

## 2015-07-27 NOTE — Plan of Care (Signed)
Problem: RH Tub/Shower Transfers Goal: LTG Patient will perform tub/shower transfers w/assist (OT) LTG: Patient will perform tub/shower transfers with assist, with/without cues using equipment (OT)  Outcome: Adequate for Discharge Tub/shower transfer simulated using tub bench with pt able to complete at min A level. Pt declined showering tasks during rehab stay. Napoleon Form, OTR/L

## 2015-07-28 LAB — RENAL FUNCTION PANEL
ALBUMIN: 2.5 g/dL — AB (ref 3.5–5.0)
Anion gap: 14 (ref 5–15)
BUN: 24 mg/dL — AB (ref 6–20)
CHLORIDE: 97 mmol/L — AB (ref 101–111)
CO2: 25 mmol/L (ref 22–32)
CREATININE: 5.87 mg/dL — AB (ref 0.44–1.00)
Calcium: 8.9 mg/dL (ref 8.9–10.3)
GFR calc Af Amer: 7 mL/min — ABNORMAL LOW (ref >60)
GFR, EST NON AFRICAN AMERICAN: 6 mL/min — AB (ref >60)
GLUCOSE: 225 mg/dL — AB (ref 65–99)
Phosphorus: 5.3 mg/dL — ABNORMAL HIGH (ref 2.5–4.6)
Potassium: 4.5 mmol/L (ref 3.5–5.1)
Sodium: 136 mmol/L (ref 135–145)

## 2015-07-28 LAB — CBC
HCT: 31 % — ABNORMAL LOW (ref 36.0–46.0)
Hemoglobin: 9.8 g/dL — ABNORMAL LOW (ref 12.0–15.0)
MCH: 32.3 pg (ref 26.0–34.0)
MCHC: 31.6 g/dL (ref 30.0–36.0)
MCV: 102.3 fL — AB (ref 78.0–100.0)
PLATELETS: 283 10*3/uL (ref 150–400)
RBC: 3.03 MIL/uL — ABNORMAL LOW (ref 3.87–5.11)
RDW: 19.2 % — AB (ref 11.5–15.5)
WBC: 5.8 10*3/uL (ref 4.0–10.5)

## 2015-07-28 LAB — GLUCOSE, CAPILLARY
GLUCOSE-CAPILLARY: 58 mg/dL — AB (ref 65–99)
GLUCOSE-CAPILLARY: 181 mg/dL — AB (ref 65–99)
Glucose-Capillary: 114 mg/dL — ABNORMAL HIGH (ref 65–99)

## 2015-07-28 MED ORDER — ASPIRIN 325 MG PO TABS: 325.0000 mg | ORAL_TABLET | Freq: Every day | ORAL | Status: DC

## 2015-07-28 MED ORDER — PRAVASTATIN SODIUM 20 MG PO TABS: 20.0000 mg | ORAL_TABLET | Freq: Every day | ORAL | Status: DC

## 2015-07-28 MED ORDER — DARBEPOETIN ALFA 60 MCG/0.3ML IJ SOSY
PREFILLED_SYRINGE | INTRAMUSCULAR | Status: AC
  Administered 2015-07-28: 60 ug via INTRAVENOUS
  Filled 2015-07-28: qty 0.3

## 2015-07-28 MED ORDER — GLIMEPIRIDE 1 MG PO TABS: 3.0000 mg | ORAL_TABLET | Freq: Every day | ORAL | Status: DC

## 2015-07-28 MED ORDER — NEPHRO-VITE 0.8 MG PO TABS: 1.0000 | ORAL_TABLET | Freq: Every day | ORAL | Status: DC

## 2015-07-28 MED ORDER — HYDRALAZINE HCL 50 MG PO TABS: 50.0000 mg | ORAL_TABLET | Freq: Two times a day (BID) | ORAL | Status: DC

## 2015-07-28 MED ORDER — NEBIVOLOL HCL 10 MG PO TABS: 10.0000 mg | ORAL_TABLET | Freq: Every day | ORAL | Status: DC

## 2015-07-28 MED ORDER — LEVOTHYROXINE SODIUM 75 MCG PO TABS: 75.0000 ug | ORAL_TABLET | Freq: Every day | ORAL | Status: DC

## 2015-07-28 MED ORDER — DICLOFENAC SODIUM 1 % TD GEL: 2.0000 g | Freq: Three times a day (TID) | TRANSDERMAL | Status: DC

## 2015-07-28 MED ORDER — ACETAMINOPHEN 325 MG PO TABS
ORAL_TABLET | ORAL | Status: AC
  Administered 2015-07-28: 325 mg via ORAL
  Filled 2015-07-28: qty 1

## 2015-07-28 MED ORDER — ISOSORBIDE MONONITRATE ER 30 MG PO TB24: 30.0000 mg | ORAL_TABLET | Freq: Every day | ORAL | Status: DC

## 2015-07-28 MED ORDER — SEVELAMER CARBONATE 800 MG PO TABS: 800.0000 mg | ORAL_TABLET | Freq: Three times a day (TID) | ORAL | Status: DC

## 2015-07-28 NOTE — Discharge Instructions (Signed)
Inpatient Rehab Discharge Instructions  [name] Discharge date and time: No discharge date for patient encounter.   Activities/Precautions/ Functional Status: Activity: Nonweightbearing right lower extremity with knee immobilizer Diet: renal diet Wound Care: keep wound clean and dry Functional status:  ___ No restrictions     ___ Walk up steps independently ___ 24/7 supervision/assistance   ___ Walk up steps with assistance ___ Intermittent supervision/assistance  ___ Bathe/dress independently ___ Walk with walker     _x__ Bathe/dress with assistance ___ Walk Independently    ___ Shower independently ___ Walk with assistance    ___ Shower with assistance ___ No alcohol     ___ Return to work/school ________       Special Instructions: Continue hemodialysis as directed     My questions have been answered and I understand these instructions. I will adhere to these goals and the provided educational materials after my discharge from the hospital.  Patient/Caregiver Signature _______________________________ Date __________  Clinician Signature _______________________________________ Date __________  Please bring this form and your medication list with you to all your follow-up doctor's appointments.

## 2015-07-28 NOTE — Procedures (Signed)
I have personally attended this patient's dialysis session.\weight 61.7 pre HD New EDW 61 116/38 R AVG 300 K 4.5 2K bath Hb 9.8 For d/c home after HD    Jamal Maes, MD [facility] [phone] Pager 07/28/2015, 10:58 AM

## 2015-07-28 NOTE — Progress Notes (Signed)
[facility] PHYSICAL MEDICINE & REHABILITATION     PROGRESS NOTE    Subjective/Complaints: Waiting to go to HD. Doing well. Excited to go ome.    ROS: Denies CP, SOB, nausea, vomiting, diarrhea.  Objective: Vital Signs: Blood pressure 123/30, pulse 51, temperature 98.2 F (36.8 C), temperature source Oral, resp. rate 18, height 5\' 1"  (1.549 m), weight 60.555 kg (133 lb 8 oz), SpO2 99 %. No results found.  Recent Labs  07/26/15 1458  WBC 7.3  HGB 9.5*  HCT 30.3*  PLT 323    Recent Labs  07/26/15 1457  NA 137  K 4.8  CL 100*  GLUCOSE 226*  BUN 29*  CREATININE 6.55*  CALCIUM 8.9   CBG (last 3)   Recent Labs  07/27/15 2135 07/28/15 0602 07/28/15 0646  GLUCAP 128* 58* 114*    Wt Readings from Last 3 Encounters:  07/28/15 60.555 kg (133 lb 8 oz)  07/11/15 66.225 kg (146 lb)  01/06/14 66.996 kg (147 lb 11.2 oz)    Physical Exam:  Constitutional: She appears well-developed and well-nourished. NAD. HENT: Normocephalic and atraumatic.  Eyes: Conjunctivae and EOM are normal.  Cardiovascular: Irregular irregular. RUE fistula  Respiratory: Effort normal and breath sounds normal. No respiratory distress.  GI: Soft. Bowel sounds are normal. She exhibits no distension.  Musculoskeletal: She exhibits edema and tenderness RLE---tr to 1+   Neurological: She is alert and oriented.  Motor: B/l UE 4/5 proximal to distal LLE: 3 proximally, 4/5 distally RLE: hip flexion 2/5, wiggles toes  Skin:  Right leg wounds clean/dry with Steri-Strips, area of eschar on patella persists with surrounding granulation. No drainage. Heel blister 75% healed.r Psychiatric: She has a normal mood and affect. Her behavior is normal.     Assessment/Plan: 1. Functional deficits secondary to right tib/fib fx which require 3+ hours per day of interdisciplinary therapy in a comprehensive inpatient rehab setting. Physiatrist is providing close team supervision and 24 hour management of active  medical problems listed below. Physiatrist and rehab team continue to assess barriers to discharge/monitor patient progress toward functional and medical goals.  Function:  Bathing Bathing position   Position: Wheelchair/chair at sink  Bathing parts Body parts bathed by patient: Right arm, Left arm, Chest, Abdomen, Front perineal area, Buttocks, Right upper leg, Left lower leg, Left upper leg, Right lower leg Body parts bathed by helper: Back  Bathing assist Assist Level: Touching or steadying assistance(Pt > 75%)      Upper Body Dressing/Undressing Upper body dressing   What is the patient wearing?: Pull over shirt/dress, Bra Bra - Perfomed by patient: Thread/unthread right bra strap, Thread/unthread left bra strap, Hook/unhook bra (pull down sports bra) Bra - Perfomed by helper: Hook/unhook bra (pull down sports bra) Pull over shirt/dress - Perfomed by patient: Thread/unthread right sleeve, Thread/unthread left sleeve, Put head through opening, Pull shirt over trunk Pull over shirt/dress - Perfomed by helper: Pull shirt over trunk        Upper body assist Assist Level: Set up   Set up : To obtain clothing/put away  Lower Body Dressing/Undressing Lower body dressing   What is the patient wearing?: Underwear, Pants, Non-skid slipper socks Underwear - Performed by patient: Thread/unthread right underwear leg, Thread/unthread left underwear leg, Pull underwear up/down Underwear - Performed by helper: Pull underwear up/down Pants- Performed by patient: Thread/unthread right pants leg, Thread/unthread left pants leg, Pull pants up/down Pants- Performed by helper: Pull pants up/down Non-skid slipper socks- Performed by patient: Don/doff right  sock Non-skid slipper socks- Performed by helper: Don/doff right sock Socks - Performed by patient: Don/doff left sock Socks - Performed by helper: Don/doff right sock Shoes - Performed by patient: Don/doff left shoe Shoes - Performed by helper:  Fasten left, Don/doff right shoe          Lower body assist Assist for lower body dressing: Touching or steadying assistance (Pt > 75%)      Toileting Toileting Toileting activity did not occur: N/A Toileting steps completed by patient: Adjust clothing prior to toileting, Performs perineal hygiene, Adjust clothing after toileting Toileting steps completed by helper: Adjust clothing after toileting, Adjust clothing prior to toileting Toileting Assistive Devices: Grab bar or rail  Toileting assist Assist level: Touching or steadying assistance (Pt.75%)   Transfers Chair/bed transfer   Chair/bed transfer method: Stand pivot Chair/bed transfer assist level: Supervision or verbal cues Chair/bed transfer assistive device: Walker, Orthosis     Locomotion Ambulation Ambulation activity did not occur: Safety/medical concerns (unsafe on eval)   Max distance: 12' Assist level: Touching or steadying assistance (Pt > 75%)   Wheelchair   Type: Manual Max wheelchair distance: 170' Assist Level: Supervision or verbal cues  Cognition Comprehension Comprehension assist level: Understands complex 90% of the time/cues 10% of the time  Expression Expression assist level: Expresses complex 90% of the time/cues < 10% of the time  Social Interaction Social Interaction assist level: Interacts appropriately with others - No medications needed.  Problem Solving Problem solving assist level: Solves complex 90% of the time/cues < 10% of the time  Memory Memory assist level: Recognizes or recalls 90% of the time/requires cueing < 10% of the time    Medical Problem List and Plan: 1. Limited functional mobility secondary to comminuted right tibia-fibula fracture. Status post ORIF 07/07/2015. Nonweightbearing. Knee immobilizer when out of bed. No ROM precautions  -would like [facility] RN to come to house.   -dc home today.   Patient to see me in the office for transitional care encounter in 1-2 weeks. 2. DVT  Prophylaxis/Anticoagulation: Subcutaneous heparin. Monitor platelet counts and any signs of bleeding 3. Pain Management: tylenol only due to cognitve side effects (family requested)  - voltaren gel for bilateral hand OA 4. Acute blood loss anemia. hgb 9.9. Fe++ supp per nephrology 5. Neuropsych: This patient is capable of making decisions on her own behalf. At baseline 6. Skin/Wound Care: can change right leg dressing as needed  -may use foam dressing over patella  -incisions looks great overall---dial soap and water only for now  -can ACE wrap RLE PRN 7. Fluids/Electrolytes/Nutrition:  8. End-stage renal disease with hemodialysis. HD after therapy. 9. PAF. Aspirin 325 mg daily. Cardiac rate control 10. Hypertension. Hydralazine 50 mg twice a day, Imdur 30 mg daily 11. Diastolic congestive heart failure. Monitor for any signs of fluid overload 12. Diabetes mellitus.     -increased am amaryl to 3mg ---improving sugars. Increase to 4mg  at home?---continue 3mg  for now  -does better with reduced sliding scale CBG (last 3)   Recent Labs  07/27/15 2135 07/28/15 0602 07/28/15 0646  GLUCAP 128* 58* 114*   13. Hypothyroidism. Synthroid 75 g daily 14. Hyperlipidemia. Pravachol 15.  Leukocytosis but afebrile, incisions look ok.   CBC Latest Ref Rng 07/26/2015 07/19/2015 07/17/2015  WBC 4.0 - 10.5 K/uL 7.3 12.9(H) 12.5(H)  Hemoglobin 12.0 - 15.0 g/dL 9.5(L) 9.9(L) 9.5(L)  Hematocrit 36.0 - 46.0 % 30.3(L) 29.6(L) 29.0(L)  Platelets 150 - 400 K/uL 323 394 391  LOS (Days) 16 A FACE TO FACE EVALUATION WAS PERFORMED  SWARTZ,ZACHARY T 07/28/2015 8:15 AM

## 2015-07-28 NOTE — Progress Notes (Signed)
[facility] Rounding Note  Subjective: Seen in HD this morning. Doing well. Ready for discharge later today.   Objective: Vital signs in last 24 hours: Temp:  [98.2 F (36.8 C)-98.4 F (36.9 C)] 98.2 F (36.8 C) (05/12 0450) Pulse Rate:  [51-56] 51 (05/12 0450) Resp:  [18] 18 (05/12 0450) BP: (103-123)/(26-38) 123/30 mmHg (05/12 0450) SpO2:  [98 %-99 %] 99 % (05/12 0450) Weight:  [133 lb 8 oz (60.555 kg)] 133 lb 8 oz (60.555 kg) (05/12 0450) Weight change: -4 lb 4.6 oz (-1.945 kg)  General: alert sitting in chair, NAD Heart: S1S2 No S3 Lungs: CTA Abdomen: soft NT Extremities: No LE edema. RLE with well healing surgical scars.  Dialysis Access: right upper AVF + bruit. Old non-functional AVG R forearm   Recent Labs  07/26/15 1457  NA 137  K 4.8  CL 100*  CO2 24  GLUCOSE 226*  BUN 29*  CREATININE 6.55*  CALCIUM 8.9  PHOS 4.5     Recent Labs  07/26/15 1457  ALBUMIN 2.4*    Scheduled Medications: . aspirin  325 mg Oral Daily  . darbepoetin (ARANESP) injection - DIALYSIS  60 mcg Intravenous Q Fri-HD  . diclofenac sodium  2 g Topical TID  . feeding supplement (GLUCERNA SHAKE)  237 mL Oral BID BM  . glimepiride  3 mg Oral Q breakfast  . heparin  5,000 Units Subcutaneous Q8H  . hydrALAZINE  50 mg Oral BID  . insulin aspart  0-24 Units Subcutaneous TID AC & HS  . isosorbide mononitrate  30 mg Oral Daily  . levothyroxine  75 mcg Oral QAC breakfast  . multivitamin  1 tablet Oral QHS  . nebivolol  10 mg Oral Daily  . pravastatin  20 mg Oral q1800  . sevelamer carbonate  800 mg Oral TID WC  . vitamin E  400 Units Oral Daily   Dialysis Orders: [facility] on MWF . EDW 64.5 HD  Bath 2K/2.5Ca  Time 3.5hrs  Heparin std.  Access RUE AVF  BFR 400 DFR 800   Assessment/Plan: Patient is an 82 year old female with history of ESRD on HD MWF admitted to inpatient rehab with a right tibia fracture s/p ORIF.  1. Right tib fx- s/p ORIF (07/07/15), working with  PT 2. ESRD - HD MWF (home unit [facility])  Has spot at [facility] MWF 2nd shift while staying in [place] after discharge. Will be staying with her daughter "for a couple of months". EDW will be lower at discharge - about 61 kg. Using tight heparin due to recent surgery.  1. Phos elevated - Have added sevelamer (previously had hypercalcemia - will avoid Ca based binders) 3. Anemia - hgbs have been stable 9-11, Aranesp 60 q Fri started 4/28, had ferric gluconate 125 X 3 doses with HD, last dose 5/6.  4. HTN/volume - controlled  5. Nutrition - renal carb mod diet/vits/nepro 6. DM2 - meds have been adjusted by primary team 7. PAF - on ASA only   LOS: 16 days   Caleb M. Jerline Pain, [place] Resident PGY-2 07/28/2015 11:39 AM   I have seen and examined this patient and agree with plan and assessment in the above note. Home after HD. Ghalia Reicks B,MD 07/28/2015 5:11 PM

## 2015-07-28 NOTE — Progress Notes (Signed)
Discharged to home accompanied by dtr. Discharge info given to dtr via phone and handed to pt in room. No questions noted. Taken out via w/c wearing KI. Margarito Liner

## 2015-07-28 NOTE — Discharge Summary (Signed)
NAMEDEVIKA, BELIVEAU NO.:  [id number]  MEDICAL RECORD NO.:  [id number]  LOCATION:  4M08C                        FACILITY:  [facility]  PHYSICIAN:  Meredith Staggers, M.D.DATE OF BIRTH:  1928-11-30  DATE OF ADMISSION:  07/12/2015 DATE OF DISCHARGE:                              DISCHARGE SUMMARY   DISCHARGE DIAGNOSES: 1. Limited functional mobility secondary to comminuted right tibia-     fibula fracture with open reduction and internal fixation on July 07, 2015. 2. Subcutaneous heparin for deep vein thrombosis prophylaxis. 3. Pain management. 4. Acute blood loss anemia. 5. End-stage renal disease with hemodialysis. 6. Paroxysmal atrial fibrillation. 7. Hypertension. 8. Diastolic congestive heart failure. 9. Diabetes mellitus, peripheral neuropathy. 10.Hypothyroidism. 11.Hyperlipidemia.  HISTORY OF PRESENT ILLNESS:  This is an 82 year old right-handed female well known to [facility] from history of a right pilon fracture received inpatient rehab services 2 years ago, was discharged home with daughter.  History of end-stage renal disease with hemodialysis as well as chronic diastolic congestive heart failure.  She currently lives in [place], [place] alone.  She has a daughter in [place], who is a case Freight forwarder.  Presented on July 05, 2015, to [facility], [place], [place] after a mechanical fall without loss of consciousness.  Sustained comminuted fracture of right tibia-fibula. Underwent ORIF on July 07, 2015, per Dr. Nicoletta Dress. Nonweightbearing with knee immobilizer when out of bed.  Hospital course, pain management.  Hemodialysis ongoing.  Subcutaneous heparin for DVT prophylaxis.  Physical and occupational therapy ongoing.  The patient was admitted for a comprehensive rehab program.  PAST MEDICAL HISTORY:  See discharge diagnoses.  SOCIAL HISTORY:  Lives alone in [place], [place], independent with a  cane and walker prior to admission.  Plans to stay with her daughter indefinitely in [place] upon discharge.  Functional status upon admission to rehab service is moderate assist sit to stand, moderate assist functional mobility, mod to max assist for activities of daily living.  PHYSICAL EXAMINATION:  VITAL SIGNS:  Blood pressure 110/70, pulse 80, respirations 18, temperature 98. GENERAL:  This was an alert female, oriented to person, place, and time. LUNGS:  Clear to auscultation without wheeze. CARDIAC:  Irregularly irregular. ABDOMEN:  Soft, nontender.  Good bowel sounds. EXTREMITIES:  Right lower extremity with knee immobilizer.  Dressing clean and dry.  REHABILITATION HOSPITAL COURSE:  The patient was admitted to inpatient rehab services with therapies initiated on a 3-hour daily basis consisting of physical therapy, occupational therapy, and rehabilitation nursing.  The following issues were addressed during the patient's rehabilitation stay.  Pertaining to Ms. Guarnieri's right tibia-fibula fracture, she had undergone ORIF on July 07, 2015, remained nonweightbearing with knee immobilizer.  No range of motion precautions. She would remain in [place] area and was to be followed by Dr. Altamese Eldon, who had seen patient in the past years.  Subcutaneous heparin for DVT prophylaxis.  No bleeding episodes.  Pain management with only Tylenol.  Acute blood loss anemia 9.9.  Iron supplement per Renal Services.  She will continue hemodialysis as advised per [facility].  Maintained on aspirin for history of PAF, cardiac rate controlled.  Blood  pressure monitored on hydralazine and Imdur. door. She exhibited no signs of fluid overload.  Diabetes mellitus, peripheral neuropathy.  Maintained on Amaryl which was adjusted as needed.  Bouts of constipation resolved with laxative assistance.  The patient received weekly collaborative interdisciplinary team conferences to  discuss estimated length of stay, family teaching, and any barriers to discharge.  The patient able to propel her wheelchair bilateral upper extremity and supervision.  Negotiated ramps with supervision.  Did require rest breaks.  Required some assistance to remove her leg rests. The patient able to stand, pivot wheelchair to bed with rolling walker and steady assist while maintaining weightbearing precautions. Ambulating 10 feet in the ADL apartment over carpeted floors with supervision, steady assistance.  Activities of daily living, home making.  She can ambulate to the toilet, toilet task completed with steady assistance.  Needs some assist for lower body dressing.  Full family teaching was completed and plan discharge to home with ongoing therapies dictated per [employer].  DISCHARGE MEDICATIONS: 1. Aspirin 325 mg p.o. daily. 2. Voltaren gel 3 times daily to affected area. 3. Amaryl 3 mg p.o. daily. 4. Hydralazine 50 mg p.o. b.i.d. 5. Imdur 30 mg p.o. daily. 6. Synthroid 75 mcg p.o. daily. 7. Rena-Vite 1 tab p.o. bedtime. 8. Bystolic 10 mg p.o. daily. 9. Pravachol 20 mg p.o. daily. 10.Renvela 800 mg p.o. t.i.d. 11.Vitamin E 400 units p.o. daily.  DIET:  Her diet was a diabetic renal diet.  FOLLOWUP:  She would follow up with Dr. Alger Simons at the outpatient rehab service office as needed; Dr. Jamal Maes, call for appointment; Dr. Altamese [place], call for appointment.  The patient would remain nonweightbearing right lower extremity with knee immobilizer.     Lauraine Rinne, P.A.   ______________________________ Meredith Staggers, M.D.    DA/MEDQ  D:  07/27/2015  T:  07/28/2015  Job:  [id number]  cc:   Elzie Rings. Lorrene Reid, M.D. Valerie A. Asa Lente, MD Astrid Divine. Marcelino Scot, M.D.

## 2015-07-31 DIAGNOSIS — Z992 Dependence on renal dialysis: Secondary | ICD-10-CM | POA: Diagnosis not present

## 2015-07-31 DIAGNOSIS — S82201D Unspecified fracture of shaft of right tibia, subsequent encounter for closed fracture with routine healing: Secondary | ICD-10-CM | POA: Diagnosis not present

## 2015-07-31 DIAGNOSIS — D631 Anemia in chronic kidney disease: Secondary | ICD-10-CM | POA: Diagnosis not present

## 2015-07-31 DIAGNOSIS — E1129 Type 2 diabetes mellitus with other diabetic kidney complication: Secondary | ICD-10-CM | POA: Diagnosis not present

## 2015-07-31 DIAGNOSIS — N186 End stage renal disease: Secondary | ICD-10-CM | POA: Diagnosis not present

## 2015-07-31 DIAGNOSIS — S82201A Unspecified fracture of shaft of right tibia, initial encounter for closed fracture: Secondary | ICD-10-CM | POA: Diagnosis not present

## 2015-07-31 NOTE — Progress Notes (Signed)
Social Work  Discharge Note  The overall goal for the admission was met for:   Discharge location: Yes - home with daughter who has arranged for 24/7 assistance  Length of Stay: Yes - 16 days  Discharge activity level: Yes - min assist  Home/community participation: Yes  Services provided included: MD, RD, PT, OT, RN, TR, Pharmacy and SW  Financial Services: Medicare  Follow-up services arranged:  Tx recommended HHPT and RN follow up but daughter declined services  Comments (or additional information):  Patient/Family verbalized understanding of follow-up arrangements: NA  Individual responsible for coordination of the follow-up plan: pt/daughter  Confirmed correct DME delivered:  NA - daughter already coordinated DME needs with [employer], [place]

## 2015-08-02 DIAGNOSIS — N186 End stage renal disease: Secondary | ICD-10-CM | POA: Diagnosis not present

## 2015-08-02 DIAGNOSIS — S82201A Unspecified fracture of shaft of right tibia, initial encounter for closed fracture: Secondary | ICD-10-CM | POA: Diagnosis not present

## 2015-08-02 DIAGNOSIS — S82201D Unspecified fracture of shaft of right tibia, subsequent encounter for closed fracture with routine healing: Secondary | ICD-10-CM | POA: Diagnosis not present

## 2015-08-02 DIAGNOSIS — D631 Anemia in chronic kidney disease: Secondary | ICD-10-CM | POA: Diagnosis not present

## 2015-08-04 DIAGNOSIS — D631 Anemia in chronic kidney disease: Secondary | ICD-10-CM | POA: Diagnosis not present

## 2015-08-04 DIAGNOSIS — N186 End stage renal disease: Secondary | ICD-10-CM | POA: Diagnosis not present

## 2015-08-07 DIAGNOSIS — N186 End stage renal disease: Secondary | ICD-10-CM | POA: Diagnosis not present

## 2015-08-07 DIAGNOSIS — D631 Anemia in chronic kidney disease: Secondary | ICD-10-CM | POA: Diagnosis not present

## 2015-08-09 DIAGNOSIS — N186 End stage renal disease: Secondary | ICD-10-CM | POA: Diagnosis not present

## 2015-08-09 DIAGNOSIS — D631 Anemia in chronic kidney disease: Secondary | ICD-10-CM | POA: Diagnosis not present

## 2015-08-11 DIAGNOSIS — D631 Anemia in chronic kidney disease: Secondary | ICD-10-CM | POA: Diagnosis not present

## 2015-08-11 DIAGNOSIS — N186 End stage renal disease: Secondary | ICD-10-CM | POA: Diagnosis not present

## 2015-08-14 DIAGNOSIS — N186 End stage renal disease: Secondary | ICD-10-CM | POA: Diagnosis not present

## 2015-08-14 DIAGNOSIS — D631 Anemia in chronic kidney disease: Secondary | ICD-10-CM | POA: Diagnosis not present

## 2015-08-16 DIAGNOSIS — S82201A Unspecified fracture of shaft of right tibia, initial encounter for closed fracture: Secondary | ICD-10-CM | POA: Diagnosis not present

## 2015-08-16 DIAGNOSIS — N186 End stage renal disease: Secondary | ICD-10-CM | POA: Diagnosis not present

## 2015-08-16 DIAGNOSIS — D631 Anemia in chronic kidney disease: Secondary | ICD-10-CM | POA: Diagnosis not present

## 2015-08-16 DIAGNOSIS — S82201D Unspecified fracture of shaft of right tibia, subsequent encounter for closed fracture with routine healing: Secondary | ICD-10-CM | POA: Diagnosis not present

## 2015-08-18 DIAGNOSIS — N186 End stage renal disease: Secondary | ICD-10-CM | POA: Diagnosis not present

## 2015-08-21 DIAGNOSIS — N186 End stage renal disease: Secondary | ICD-10-CM | POA: Diagnosis not present

## 2015-08-23 DIAGNOSIS — N186 End stage renal disease: Secondary | ICD-10-CM | POA: Diagnosis not present

## 2015-08-25 DIAGNOSIS — N186 End stage renal disease: Secondary | ICD-10-CM | POA: Diagnosis not present

## 2015-08-28 DIAGNOSIS — N186 End stage renal disease: Secondary | ICD-10-CM | POA: Diagnosis not present

## 2015-08-29 ENCOUNTER — Ambulatory Visit (INDEPENDENT_AMBULATORY_CARE_PROVIDER_SITE_OTHER): Payer: Medicare Other | Admitting: Internal Medicine

## 2015-08-29 ENCOUNTER — Encounter: Payer: Self-pay | Admitting: Internal Medicine

## 2015-08-29 VITALS — BP 160/82 | HR 66 | Temp 97.8°F | Resp 16 | Wt 133.0 lb

## 2015-08-29 DIAGNOSIS — Z992 Dependence on renal dialysis: Secondary | ICD-10-CM

## 2015-08-29 DIAGNOSIS — N184 Chronic kidney disease, stage 4 (severe): Secondary | ICD-10-CM

## 2015-08-29 DIAGNOSIS — I10 Essential (primary) hypertension: Secondary | ICD-10-CM | POA: Diagnosis not present

## 2015-08-29 DIAGNOSIS — E785 Hyperlipidemia, unspecified: Secondary | ICD-10-CM

## 2015-08-29 DIAGNOSIS — I480 Paroxysmal atrial fibrillation: Secondary | ICD-10-CM

## 2015-08-29 DIAGNOSIS — N186 End stage renal disease: Secondary | ICD-10-CM | POA: Diagnosis not present

## 2015-08-29 DIAGNOSIS — E1122 Type 2 diabetes mellitus with diabetic chronic kidney disease: Secondary | ICD-10-CM

## 2015-08-29 DIAGNOSIS — E038 Other specified hypothyroidism: Secondary | ICD-10-CM

## 2015-08-29 DIAGNOSIS — I5042 Chronic combined systolic (congestive) and diastolic (congestive) heart failure: Secondary | ICD-10-CM

## 2015-08-29 MED ORDER — NEPHRO-VITE 0.8 MG PO TABS: 1.0000 | ORAL_TABLET | Freq: Every day | ORAL | Status: DC

## 2015-08-29 MED ORDER — SEVELAMER CARBONATE 800 MG PO TABS: 800.0000 mg | ORAL_TABLET | Freq: Three times a day (TID) | ORAL | Status: DC

## 2015-08-29 MED ORDER — NEBIVOLOL HCL 10 MG PO TABS: 10.0000 mg | ORAL_TABLET | Freq: Every day | ORAL | Status: DC

## 2015-08-29 MED ORDER — GLIMEPIRIDE 1 MG PO TABS: 3.0000 mg | ORAL_TABLET | Freq: Every day | ORAL | Status: DC

## 2015-08-29 MED ORDER — PRAVASTATIN SODIUM 20 MG PO TABS: 20.0000 mg | ORAL_TABLET | Freq: Every day | ORAL | Status: DC

## 2015-08-29 MED ORDER — LEVOTHYROXINE SODIUM 75 MCG PO TABS: 75.0000 ug | ORAL_TABLET | Freq: Every day | ORAL | Status: DC

## 2015-08-29 MED ORDER — HYDRALAZINE HCL 50 MG PO TABS: 50.0000 mg | ORAL_TABLET | Freq: Two times a day (BID) | ORAL | Status: DC

## 2015-08-29 NOTE — Patient Instructions (Signed)
   Medications reviewed and updated.  No changes recommended at this time.  Your prescription(s) have been submitted to your pharmacy. Please take as directed and contact our office if you believe you are having problem(s) with the medication(s).   Please followup as needed

## 2015-08-29 NOTE — Progress Notes (Signed)
Pre visit review using our clinic review tool, if applicable. No additional management support is needed unless otherwise documented below in the visit note. 

## 2015-08-29 NOTE — Progress Notes (Signed)
Subjective:    Patient ID: [name], female    DOB: 16-Sep-1928, 82 y.o.   MRN: [id number]  HPI She is here to establish with a new pcp.  She is here for follow up.  She lives in [place], but is here living with her daughter since she fell and broke her leg.  Most likely she will not be returning home to [place].    Diabetes: She is taking her medication daily as prescribed. She was on insulin but had low sugars and was switched to amaryl.  She is fairly compliant with a diabetic diet. She is not exercising regularly. She monitors her sugars and they have been running 91,101 in the morning, but sometimes in the evening it goes up to 200.   Hypothyroidism:  She is taking her medication daily.  She denies any recent changes in energy or weight that are unexplained.   Hyperlipidemia: She is taking her medication daily. She is compliant with a low fat/cholesterol diet. She is not exercising regularly. She denies myalgias.   Afib, CHF (diastolic and systolic), Hypertension: she has paroxysmal afib.  She is taking her medication daily. She is compliant with a low sodium diet.  She denies chest pain, shortness of breath and regular headaches. She gets palpitations on occasion and has swelling in her right leg since she broke it.      ESRD on HD:  She goes to HD MWF.    Fell (07/05/15) and broke leg (surgery 4/21):  She is now living with her daughter after being in rehab.    She is following with ortho.  She has a bad wound with an eschar on the right knee and she is being referred to [facility] for further evaluation.  Prior to surgery she was walking with and without a cane.  Was living her living by herself.  She will start PT after the wound is taken care of.  She is currently wheelchair bound and eager to be able to walk again.   Medications and allergies reviewed with patient and updated if appropriate.  Patient Active Problem List   Diagnosis Date Noted  . Labile blood glucose   .  Memory loss   . Hip fracture ([place]) 07/12/2015  . Swelling   . ESRD on dialysis ([place])   . PAF (paroxysmal atrial fibrillation) ([place])   . Chronic diastolic congestive heart failure ([place])   . Thyroid activity decreased   . Elevated brain natriuretic peptide (BNP) level 01/06/2014  . Chest pain, atypical 08/13/2012  . Long term (current) use of anticoagulants 07/30/2012  . Chronic combined systolic and diastolic heart failure EF 45-50% by echo 06/25/2012 07/30/2012  . Breast cancer, s/p Lt mastectomy 07/22/2012  . Chronic anticoagulation 07/22/2012  . Acute on chronic systolic and diastolic heart failure, NYHA class 4 ([place]) 07/20/2012  . Pleural effusion due to CHF (congestive heart failure) ([place]) 07/20/2012  . Atrial fibrillation, spont convers NSR 06/26/12 06/25/2012  . Hypotension, unspecified 06/25/2012  . Diastolic CHF, chronic ([place]) 06/25/2012  . Right tibial fracture 06/21/2012  . Metabolic bone disease [id number]  . Secondary hyperparathyroidism of renal origin ([place]) 06/19/2012  . Nonunion of fracture, R tibia 06/17/2012  . H/O radioactive iodine thyroid ablation   . Closed fracture of right fibula and tibia 07/10/2011  . HTN (hypertension) 07/10/2011  . DM2 (diabetes mellitus, type 2) ([place]) 07/10/2011  . Hyperlipidemia 07/10/2011  . Anemia 07/10/2011    Current Outpatient Prescriptions on File Prior to  Visit  Medication Sig Dispense Refill  . aspirin 325 MG tablet Take 1 tablet (325 mg total) by mouth daily.    . darbepoetin (ARANESP) 100 MCG/0.5ML SOLN Inject 0.5 mLs (100 mcg total) into the vein every Wednesday with hemodialysis. 4.2 mL 6  . diclofenac sodium (VOLTAREN) 1 % GEL Apply 2 g topically 3 (three) times daily. 1 Tube 1  . isosorbide mononitrate (IMDUR) 30 MG 24 hr tablet Take 1 tablet (30 mg total) by mouth daily. 30 tablet 1  . Vitamin D, Ergocalciferol, (DRISDOL) 50000 UNITS CAPS Take 50,000 Units by mouth every 7 (seven) days. Takes on Fridays.    . vitamin E  400 UNIT capsule Take 400 Units by mouth daily.     No current facility-administered medications on file prior to visit.    Past Medical History  Diagnosis Date  . CHF (congestive heart failure) ([place])   . DM2 (diabetes mellitus, type 2) ([place])   . ESRD (end stage renal disease) ([place])     HD MWF [facility]  . HTN (hypertension)   . Hyperlipidemia   . Tibia/fibula fracture 07/10/2011    right - nonunion repair 06/2012  . Hypothyroid   . Breast cancer ([place])   . Arthritis   . Hyperparathyroidism, secondary renal ([place])   . Shortness of breath   . Atrial fibrillation ([place]) dx 06/2012    on coumadin  . Atrial fibrillation ([place]) 06/25/2012    2D Echo - EF 45-50, left atrium severely dilated, mitral valve heavily calcified annulus with calcification of the subvalvular apparatus    Past Surgical History  Procedure Laterality Date  . Mastectomy    . Abdominal surgery    . Thyroid surgery    . Breast surgery Left     Mastectomy  . Eye surgery Bilateral     Cataract  . Ankle fusion Right   . Arteriovenous graft placement Right   . Orif tibia fracture Right 06/16/2012    Procedure: OPEN REDUCTION INTERNAL FIXATION (ORIF) TIBIA FRACTURE;  Surgeon: Rozanna Box, MD;  Location: [facility];  Service: Orthopedics;  Laterality: Right;    Social History   Social History  . Marital Status: Single    Spouse Name: N/A  . Number of Children: N/A  . Years of Education: N/A   Social History Main Topics  . Smoking status: Never Smoker   . Smokeless tobacco: Current User    Types: Snuff  . Alcohol Use: No  . Drug Use: No  . Sexual Activity: No   Other Topics Concern  . None   Social History Narrative    Family History  Problem Relation Age of Onset  . Hyperlipidemia Father   . Diabetes Father   . Breast cancer Sister     Review of Systems  Constitutional: Negative for fever and appetite change.  Respiratory: Negative for cough, shortness of breath and wheezing.   Cardiovascular:  Positive for palpitations (occasional) and leg swelling (right leg since fracture). Negative for chest pain.  Gastrointestinal: Negative for abdominal pain, diarrhea and constipation.  Genitourinary: Negative for dysuria and hematuria.       Produces a small amount  Neurological: Positive for dizziness and light-headedness (after HD). Negative for headaches.  Psychiatric/Behavioral: Negative for dysphoric mood. The patient is not nervous/anxious.        Objective:   Filed Vitals:   08/29/15 1316  BP: 160/82  Pulse: 66  Temp: 97.8 F (36.6 C)  Resp: 16   Filed  Weights   08/29/15 1316  Weight: 133 lb (60.328 kg)   Body mass index is 25.14 kg/(m^2).   Physical Exam Constitutional: Appears well-developed and well-nourished. No distress.  Neck: Neck supple. No tracheal deviation present. No thyromegaly present.  No carotid bruit. No cervical adenopathy.   Cardiovascular: Normal rate, regular rhythm and normal heart sounds.   2/6 systolic murmur heard.  1 + edema RLE Pulmonary/Chest: Effort normal and breath sounds normal. No respiratory distress. No wheezes.  Abd: soft, nontender Skin: large eschar right knee with granulation tissue and some discharge       Assessment & Plan:   See Problem List for Assessment and Plan of chronic medical problems.

## 2015-08-30 DIAGNOSIS — N186 End stage renal disease: Secondary | ICD-10-CM | POA: Diagnosis not present

## 2015-08-30 DIAGNOSIS — E039 Hypothyroidism, unspecified: Secondary | ICD-10-CM | POA: Insufficient documentation

## 2015-08-30 NOTE — Assessment & Plan Note (Signed)
On pravastatin - will continue

## 2015-08-30 NOTE — Assessment & Plan Note (Signed)
BP slightly elevated here today Per patient BP is overall controlled Continue current medications

## 2015-08-30 NOTE — Assessment & Plan Note (Signed)
Sugars low in morning, slightly higher in evening Had hypoglycemia on insulin Continue current dose of amaryl Monitor sugars

## 2015-08-30 NOTE — Assessment & Plan Note (Signed)
Continue current dose of medication 

## 2015-08-30 NOTE — Assessment & Plan Note (Signed)
In sinus  Continue current medication

## 2015-08-30 NOTE — Assessment & Plan Note (Signed)
Not volume overloaded on exam - right leg edema from fracture HM MWF Continue current meds

## 2015-09-01 DIAGNOSIS — S81001A Unspecified open wound, right knee, initial encounter: Secondary | ICD-10-CM | POA: Diagnosis not present

## 2015-09-01 DIAGNOSIS — N186 End stage renal disease: Secondary | ICD-10-CM | POA: Diagnosis not present

## 2015-09-04 DIAGNOSIS — N186 End stage renal disease: Secondary | ICD-10-CM | POA: Diagnosis not present

## 2015-09-06 DIAGNOSIS — N186 End stage renal disease: Secondary | ICD-10-CM | POA: Diagnosis not present

## 2015-09-08 DIAGNOSIS — N186 End stage renal disease: Secondary | ICD-10-CM | POA: Diagnosis not present

## 2015-09-10 DIAGNOSIS — H40009 Preglaucoma, unspecified, unspecified eye: Secondary | ICD-10-CM | POA: Diagnosis present

## 2015-09-10 DIAGNOSIS — D649 Anemia, unspecified: Secondary | ICD-10-CM | POA: Diagnosis present

## 2015-09-10 DIAGNOSIS — R262 Difficulty in walking, not elsewhere classified: Secondary | ICD-10-CM | POA: Diagnosis not present

## 2015-09-10 DIAGNOSIS — E1122 Type 2 diabetes mellitus with diabetic chronic kidney disease: Secondary | ICD-10-CM | POA: Diagnosis present

## 2015-09-10 DIAGNOSIS — Z7901 Long term (current) use of anticoagulants: Secondary | ICD-10-CM | POA: Diagnosis not present

## 2015-09-10 DIAGNOSIS — E039 Hypothyroidism, unspecified: Secondary | ICD-10-CM | POA: Diagnosis present

## 2015-09-10 DIAGNOSIS — S81001A Unspecified open wound, right knee, initial encounter: Secondary | ICD-10-CM | POA: Diagnosis not present

## 2015-09-10 DIAGNOSIS — L089 Local infection of the skin and subcutaneous tissue, unspecified: Secondary | ICD-10-CM | POA: Diagnosis not present

## 2015-09-10 DIAGNOSIS — Y33XXXD Other specified events, undetermined intent, subsequent encounter: Secondary | ICD-10-CM | POA: Diagnosis not present

## 2015-09-10 DIAGNOSIS — E1129 Type 2 diabetes mellitus with other diabetic kidney complication: Secondary | ICD-10-CM | POA: Diagnosis not present

## 2015-09-10 DIAGNOSIS — T148 Other injury of unspecified body region: Secondary | ICD-10-CM | POA: Diagnosis not present

## 2015-09-10 DIAGNOSIS — I5042 Chronic combined systolic (congestive) and diastolic (congestive) heart failure: Secondary | ICD-10-CM | POA: Diagnosis present

## 2015-09-10 DIAGNOSIS — I132 Hypertensive heart and chronic kidney disease with heart failure and with stage 5 chronic kidney disease, or end stage renal disease: Secondary | ICD-10-CM | POA: Diagnosis present

## 2015-09-10 DIAGNOSIS — Z853 Personal history of malignant neoplasm of breast: Secondary | ICD-10-CM | POA: Diagnosis not present

## 2015-09-10 DIAGNOSIS — E785 Hyperlipidemia, unspecified: Secondary | ICD-10-CM | POA: Diagnosis present

## 2015-09-10 DIAGNOSIS — Z923 Personal history of irradiation: Secondary | ICD-10-CM | POA: Diagnosis not present

## 2015-09-10 DIAGNOSIS — B9620 Unspecified Escherichia coli [E. coli] as the cause of diseases classified elsewhere: Secondary | ICD-10-CM | POA: Diagnosis present

## 2015-09-10 DIAGNOSIS — I4891 Unspecified atrial fibrillation: Secondary | ICD-10-CM | POA: Diagnosis present

## 2015-09-10 DIAGNOSIS — S82201D Unspecified fracture of shaft of right tibia, subsequent encounter for closed fracture with routine healing: Secondary | ICD-10-CM | POA: Diagnosis not present

## 2015-09-10 DIAGNOSIS — E440 Moderate protein-calorie malnutrition: Secondary | ICD-10-CM | POA: Diagnosis present

## 2015-09-10 DIAGNOSIS — M659 Synovitis and tenosynovitis, unspecified: Secondary | ICD-10-CM | POA: Diagnosis present

## 2015-09-10 DIAGNOSIS — T814XXA Infection following a procedure, initial encounter: Secondary | ICD-10-CM | POA: Diagnosis present

## 2015-09-10 DIAGNOSIS — B9562 Methicillin resistant Staphylococcus aureus infection as the cause of diseases classified elsewhere: Secondary | ICD-10-CM | POA: Diagnosis present

## 2015-09-10 DIAGNOSIS — Z6825 Body mass index (BMI) 25.0-25.9, adult: Secondary | ICD-10-CM | POA: Diagnosis not present

## 2015-09-10 DIAGNOSIS — S81011A Laceration without foreign body, right knee, initial encounter: Secondary | ICD-10-CM | POA: Diagnosis not present

## 2015-09-10 DIAGNOSIS — Z992 Dependence on renal dialysis: Secondary | ICD-10-CM | POA: Diagnosis not present

## 2015-09-10 DIAGNOSIS — N186 End stage renal disease: Secondary | ICD-10-CM | POA: Diagnosis not present

## 2015-09-15 DIAGNOSIS — Z992 Dependence on renal dialysis: Secondary | ICD-10-CM | POA: Diagnosis not present

## 2015-09-15 DIAGNOSIS — N186 End stage renal disease: Secondary | ICD-10-CM | POA: Diagnosis not present

## 2015-09-15 DIAGNOSIS — E1129 Type 2 diabetes mellitus with other diabetic kidney complication: Secondary | ICD-10-CM | POA: Diagnosis not present

## 2015-09-18 DIAGNOSIS — N186 End stage renal disease: Secondary | ICD-10-CM | POA: Diagnosis not present

## 2015-09-18 DIAGNOSIS — S81001A Unspecified open wound, right knee, initial encounter: Secondary | ICD-10-CM | POA: Diagnosis not present

## 2015-09-18 DIAGNOSIS — D631 Anemia in chronic kidney disease: Secondary | ICD-10-CM | POA: Diagnosis not present

## 2015-09-18 DIAGNOSIS — N2581 Secondary hyperparathyroidism of renal origin: Secondary | ICD-10-CM | POA: Diagnosis not present

## 2015-09-20 DIAGNOSIS — N2581 Secondary hyperparathyroidism of renal origin: Secondary | ICD-10-CM | POA: Diagnosis not present

## 2015-09-20 DIAGNOSIS — D631 Anemia in chronic kidney disease: Secondary | ICD-10-CM | POA: Diagnosis not present

## 2015-09-20 DIAGNOSIS — N186 End stage renal disease: Secondary | ICD-10-CM | POA: Diagnosis not present

## 2015-09-20 DIAGNOSIS — S81001A Unspecified open wound, right knee, initial encounter: Secondary | ICD-10-CM | POA: Diagnosis not present

## 2015-09-22 DIAGNOSIS — D631 Anemia in chronic kidney disease: Secondary | ICD-10-CM | POA: Diagnosis not present

## 2015-09-22 DIAGNOSIS — S81001A Unspecified open wound, right knee, initial encounter: Secondary | ICD-10-CM | POA: Diagnosis not present

## 2015-09-22 DIAGNOSIS — N186 End stage renal disease: Secondary | ICD-10-CM | POA: Diagnosis not present

## 2015-09-22 DIAGNOSIS — N2581 Secondary hyperparathyroidism of renal origin: Secondary | ICD-10-CM | POA: Diagnosis not present

## 2015-09-25 DIAGNOSIS — S81001A Unspecified open wound, right knee, initial encounter: Secondary | ICD-10-CM | POA: Diagnosis not present

## 2015-09-25 DIAGNOSIS — D631 Anemia in chronic kidney disease: Secondary | ICD-10-CM | POA: Diagnosis not present

## 2015-09-25 DIAGNOSIS — N2581 Secondary hyperparathyroidism of renal origin: Secondary | ICD-10-CM | POA: Diagnosis not present

## 2015-09-25 DIAGNOSIS — N186 End stage renal disease: Secondary | ICD-10-CM | POA: Diagnosis not present

## 2015-09-27 DIAGNOSIS — N186 End stage renal disease: Secondary | ICD-10-CM | POA: Diagnosis not present

## 2015-09-27 DIAGNOSIS — N2581 Secondary hyperparathyroidism of renal origin: Secondary | ICD-10-CM | POA: Diagnosis not present

## 2015-09-27 DIAGNOSIS — D631 Anemia in chronic kidney disease: Secondary | ICD-10-CM | POA: Diagnosis not present

## 2015-09-27 DIAGNOSIS — S81001A Unspecified open wound, right knee, initial encounter: Secondary | ICD-10-CM | POA: Diagnosis not present

## 2015-09-27 DIAGNOSIS — R509 Fever, unspecified: Secondary | ICD-10-CM | POA: Diagnosis not present

## 2015-09-28 DIAGNOSIS — N2581 Secondary hyperparathyroidism of renal origin: Secondary | ICD-10-CM | POA: Diagnosis not present

## 2015-09-28 DIAGNOSIS — S81001A Unspecified open wound, right knee, initial encounter: Secondary | ICD-10-CM | POA: Diagnosis not present

## 2015-09-28 DIAGNOSIS — D631 Anemia in chronic kidney disease: Secondary | ICD-10-CM | POA: Diagnosis not present

## 2015-09-28 DIAGNOSIS — N186 End stage renal disease: Secondary | ICD-10-CM | POA: Diagnosis not present

## 2015-09-29 DIAGNOSIS — S81001D Unspecified open wound, right knee, subsequent encounter: Secondary | ICD-10-CM | POA: Diagnosis not present

## 2015-09-29 DIAGNOSIS — Z945 Skin transplant status: Secondary | ICD-10-CM | POA: Diagnosis not present

## 2015-10-02 DIAGNOSIS — N186 End stage renal disease: Secondary | ICD-10-CM | POA: Diagnosis not present

## 2015-10-02 DIAGNOSIS — D631 Anemia in chronic kidney disease: Secondary | ICD-10-CM | POA: Diagnosis not present

## 2015-10-02 DIAGNOSIS — S81001A Unspecified open wound, right knee, initial encounter: Secondary | ICD-10-CM | POA: Diagnosis not present

## 2015-10-02 DIAGNOSIS — N2581 Secondary hyperparathyroidism of renal origin: Secondary | ICD-10-CM | POA: Diagnosis not present

## 2015-10-04 DIAGNOSIS — N186 End stage renal disease: Secondary | ICD-10-CM | POA: Diagnosis not present

## 2015-10-04 DIAGNOSIS — D631 Anemia in chronic kidney disease: Secondary | ICD-10-CM | POA: Diagnosis not present

## 2015-10-04 DIAGNOSIS — R509 Fever, unspecified: Secondary | ICD-10-CM | POA: Diagnosis not present

## 2015-10-04 DIAGNOSIS — N2581 Secondary hyperparathyroidism of renal origin: Secondary | ICD-10-CM | POA: Diagnosis not present

## 2015-10-04 DIAGNOSIS — S81001A Unspecified open wound, right knee, initial encounter: Secondary | ICD-10-CM | POA: Diagnosis not present

## 2015-10-06 DIAGNOSIS — N2581 Secondary hyperparathyroidism of renal origin: Secondary | ICD-10-CM | POA: Diagnosis not present

## 2015-10-06 DIAGNOSIS — S81001A Unspecified open wound, right knee, initial encounter: Secondary | ICD-10-CM | POA: Diagnosis not present

## 2015-10-06 DIAGNOSIS — N186 End stage renal disease: Secondary | ICD-10-CM | POA: Diagnosis not present

## 2015-10-06 DIAGNOSIS — D631 Anemia in chronic kidney disease: Secondary | ICD-10-CM | POA: Diagnosis not present

## 2015-10-09 DIAGNOSIS — S81001A Unspecified open wound, right knee, initial encounter: Secondary | ICD-10-CM | POA: Diagnosis not present

## 2015-10-09 DIAGNOSIS — N186 End stage renal disease: Secondary | ICD-10-CM | POA: Diagnosis not present

## 2015-10-09 DIAGNOSIS — D631 Anemia in chronic kidney disease: Secondary | ICD-10-CM | POA: Diagnosis not present

## 2015-10-09 DIAGNOSIS — N2581 Secondary hyperparathyroidism of renal origin: Secondary | ICD-10-CM | POA: Diagnosis not present

## 2015-10-11 DIAGNOSIS — N2581 Secondary hyperparathyroidism of renal origin: Secondary | ICD-10-CM | POA: Diagnosis not present

## 2015-10-11 DIAGNOSIS — N186 End stage renal disease: Secondary | ICD-10-CM | POA: Diagnosis not present

## 2015-10-11 DIAGNOSIS — E1129 Type 2 diabetes mellitus with other diabetic kidney complication: Secondary | ICD-10-CM | POA: Diagnosis not present

## 2015-10-11 DIAGNOSIS — S81001A Unspecified open wound, right knee, initial encounter: Secondary | ICD-10-CM | POA: Diagnosis not present

## 2015-10-11 DIAGNOSIS — D631 Anemia in chronic kidney disease: Secondary | ICD-10-CM | POA: Diagnosis not present

## 2015-10-13 DIAGNOSIS — N186 End stage renal disease: Secondary | ICD-10-CM | POA: Diagnosis not present

## 2015-10-13 DIAGNOSIS — S81001A Unspecified open wound, right knee, initial encounter: Secondary | ICD-10-CM | POA: Diagnosis not present

## 2015-10-13 DIAGNOSIS — N2581 Secondary hyperparathyroidism of renal origin: Secondary | ICD-10-CM | POA: Diagnosis not present

## 2015-10-13 DIAGNOSIS — D631 Anemia in chronic kidney disease: Secondary | ICD-10-CM | POA: Diagnosis not present

## 2015-10-16 DIAGNOSIS — Z992 Dependence on renal dialysis: Secondary | ICD-10-CM | POA: Diagnosis not present

## 2015-10-16 DIAGNOSIS — S81001A Unspecified open wound, right knee, initial encounter: Secondary | ICD-10-CM | POA: Diagnosis not present

## 2015-10-16 DIAGNOSIS — N2581 Secondary hyperparathyroidism of renal origin: Secondary | ICD-10-CM | POA: Diagnosis not present

## 2015-10-16 DIAGNOSIS — N186 End stage renal disease: Secondary | ICD-10-CM | POA: Diagnosis not present

## 2015-10-16 DIAGNOSIS — E1129 Type 2 diabetes mellitus with other diabetic kidney complication: Secondary | ICD-10-CM | POA: Diagnosis not present

## 2015-10-16 DIAGNOSIS — D631 Anemia in chronic kidney disease: Secondary | ICD-10-CM | POA: Diagnosis not present

## 2015-10-17 DIAGNOSIS — Z945 Skin transplant status: Secondary | ICD-10-CM | POA: Diagnosis not present

## 2015-10-18 DIAGNOSIS — N186 End stage renal disease: Secondary | ICD-10-CM | POA: Diagnosis not present

## 2015-10-18 DIAGNOSIS — N2581 Secondary hyperparathyroidism of renal origin: Secondary | ICD-10-CM | POA: Diagnosis not present

## 2015-10-18 DIAGNOSIS — D631 Anemia in chronic kidney disease: Secondary | ICD-10-CM | POA: Diagnosis not present

## 2015-10-20 DIAGNOSIS — N2581 Secondary hyperparathyroidism of renal origin: Secondary | ICD-10-CM | POA: Diagnosis not present

## 2015-10-20 DIAGNOSIS — N186 End stage renal disease: Secondary | ICD-10-CM | POA: Diagnosis not present

## 2015-10-20 DIAGNOSIS — D631 Anemia in chronic kidney disease: Secondary | ICD-10-CM | POA: Diagnosis not present

## 2015-10-23 DIAGNOSIS — N2581 Secondary hyperparathyroidism of renal origin: Secondary | ICD-10-CM | POA: Diagnosis not present

## 2015-10-23 DIAGNOSIS — D631 Anemia in chronic kidney disease: Secondary | ICD-10-CM | POA: Diagnosis not present

## 2015-10-23 DIAGNOSIS — N186 End stage renal disease: Secondary | ICD-10-CM | POA: Diagnosis not present

## 2015-10-25 DIAGNOSIS — N2581 Secondary hyperparathyroidism of renal origin: Secondary | ICD-10-CM | POA: Diagnosis not present

## 2015-10-25 DIAGNOSIS — D631 Anemia in chronic kidney disease: Secondary | ICD-10-CM | POA: Diagnosis not present

## 2015-10-25 DIAGNOSIS — N186 End stage renal disease: Secondary | ICD-10-CM | POA: Diagnosis not present

## 2015-10-26 DIAGNOSIS — N186 End stage renal disease: Secondary | ICD-10-CM | POA: Diagnosis not present

## 2015-10-26 DIAGNOSIS — I871 Compression of vein: Secondary | ICD-10-CM | POA: Diagnosis not present

## 2015-10-26 DIAGNOSIS — Z992 Dependence on renal dialysis: Secondary | ICD-10-CM | POA: Diagnosis not present

## 2015-10-26 DIAGNOSIS — T82858D Stenosis of vascular prosthetic devices, implants and grafts, subsequent encounter: Secondary | ICD-10-CM | POA: Diagnosis not present

## 2015-10-27 DIAGNOSIS — N2581 Secondary hyperparathyroidism of renal origin: Secondary | ICD-10-CM | POA: Diagnosis not present

## 2015-10-27 DIAGNOSIS — D631 Anemia in chronic kidney disease: Secondary | ICD-10-CM | POA: Diagnosis not present

## 2015-10-27 DIAGNOSIS — N186 End stage renal disease: Secondary | ICD-10-CM | POA: Diagnosis not present

## 2015-10-30 DIAGNOSIS — D631 Anemia in chronic kidney disease: Secondary | ICD-10-CM | POA: Diagnosis not present

## 2015-10-30 DIAGNOSIS — N186 End stage renal disease: Secondary | ICD-10-CM | POA: Diagnosis not present

## 2015-10-30 DIAGNOSIS — N2581 Secondary hyperparathyroidism of renal origin: Secondary | ICD-10-CM | POA: Diagnosis not present

## 2015-11-01 DIAGNOSIS — S81001A Unspecified open wound, right knee, initial encounter: Secondary | ICD-10-CM | POA: Diagnosis not present

## 2015-11-01 DIAGNOSIS — N2581 Secondary hyperparathyroidism of renal origin: Secondary | ICD-10-CM | POA: Diagnosis not present

## 2015-11-01 DIAGNOSIS — D631 Anemia in chronic kidney disease: Secondary | ICD-10-CM | POA: Diagnosis not present

## 2015-11-01 DIAGNOSIS — S82201D Unspecified fracture of shaft of right tibia, subsequent encounter for closed fracture with routine healing: Secondary | ICD-10-CM | POA: Diagnosis not present

## 2015-11-01 DIAGNOSIS — N186 End stage renal disease: Secondary | ICD-10-CM | POA: Diagnosis not present

## 2015-11-03 DIAGNOSIS — N2581 Secondary hyperparathyroidism of renal origin: Secondary | ICD-10-CM | POA: Diagnosis not present

## 2015-11-03 DIAGNOSIS — D631 Anemia in chronic kidney disease: Secondary | ICD-10-CM | POA: Diagnosis not present

## 2015-11-03 DIAGNOSIS — N186 End stage renal disease: Secondary | ICD-10-CM | POA: Diagnosis not present

## 2015-11-06 DIAGNOSIS — D631 Anemia in chronic kidney disease: Secondary | ICD-10-CM | POA: Diagnosis not present

## 2015-11-06 DIAGNOSIS — N2581 Secondary hyperparathyroidism of renal origin: Secondary | ICD-10-CM | POA: Diagnosis not present

## 2015-11-06 DIAGNOSIS — N186 End stage renal disease: Secondary | ICD-10-CM | POA: Diagnosis not present

## 2015-11-08 DIAGNOSIS — D631 Anemia in chronic kidney disease: Secondary | ICD-10-CM | POA: Diagnosis not present

## 2015-11-08 DIAGNOSIS — N186 End stage renal disease: Secondary | ICD-10-CM | POA: Diagnosis not present

## 2015-11-08 DIAGNOSIS — N2581 Secondary hyperparathyroidism of renal origin: Secondary | ICD-10-CM | POA: Diagnosis not present

## 2015-11-10 DIAGNOSIS — N2581 Secondary hyperparathyroidism of renal origin: Secondary | ICD-10-CM | POA: Diagnosis not present

## 2015-11-10 DIAGNOSIS — D631 Anemia in chronic kidney disease: Secondary | ICD-10-CM | POA: Diagnosis not present

## 2015-11-10 DIAGNOSIS — N186 End stage renal disease: Secondary | ICD-10-CM | POA: Diagnosis not present

## 2015-11-13 DIAGNOSIS — D509 Iron deficiency anemia, unspecified: Secondary | ICD-10-CM | POA: Diagnosis not present

## 2015-11-13 DIAGNOSIS — N186 End stage renal disease: Secondary | ICD-10-CM | POA: Diagnosis not present

## 2015-11-16 DIAGNOSIS — E1129 Type 2 diabetes mellitus with other diabetic kidney complication: Secondary | ICD-10-CM | POA: Diagnosis not present

## 2015-11-16 DIAGNOSIS — Z992 Dependence on renal dialysis: Secondary | ICD-10-CM | POA: Diagnosis not present

## 2015-11-16 DIAGNOSIS — D509 Iron deficiency anemia, unspecified: Secondary | ICD-10-CM | POA: Diagnosis not present

## 2015-11-16 DIAGNOSIS — N186 End stage renal disease: Secondary | ICD-10-CM | POA: Diagnosis not present

## 2015-11-18 DIAGNOSIS — N186 End stage renal disease: Secondary | ICD-10-CM | POA: Diagnosis not present

## 2015-11-18 DIAGNOSIS — D509 Iron deficiency anemia, unspecified: Secondary | ICD-10-CM | POA: Diagnosis not present

## 2015-11-18 DIAGNOSIS — D631 Anemia in chronic kidney disease: Secondary | ICD-10-CM | POA: Diagnosis not present

## 2015-11-18 DIAGNOSIS — E876 Hypokalemia: Secondary | ICD-10-CM | POA: Diagnosis not present

## 2015-11-21 DIAGNOSIS — D509 Iron deficiency anemia, unspecified: Secondary | ICD-10-CM | POA: Diagnosis not present

## 2015-11-21 DIAGNOSIS — N186 End stage renal disease: Secondary | ICD-10-CM | POA: Diagnosis not present

## 2015-11-21 DIAGNOSIS — E876 Hypokalemia: Secondary | ICD-10-CM | POA: Diagnosis not present

## 2015-11-21 DIAGNOSIS — D631 Anemia in chronic kidney disease: Secondary | ICD-10-CM | POA: Diagnosis not present

## 2015-11-23 DIAGNOSIS — D631 Anemia in chronic kidney disease: Secondary | ICD-10-CM | POA: Diagnosis not present

## 2015-11-23 DIAGNOSIS — D509 Iron deficiency anemia, unspecified: Secondary | ICD-10-CM | POA: Diagnosis not present

## 2015-11-23 DIAGNOSIS — E876 Hypokalemia: Secondary | ICD-10-CM | POA: Diagnosis not present

## 2015-11-23 DIAGNOSIS — N186 End stage renal disease: Secondary | ICD-10-CM | POA: Diagnosis not present

## 2015-11-24 DIAGNOSIS — N186 End stage renal disease: Secondary | ICD-10-CM | POA: Diagnosis not present

## 2015-11-24 DIAGNOSIS — D631 Anemia in chronic kidney disease: Secondary | ICD-10-CM | POA: Diagnosis not present

## 2015-11-24 DIAGNOSIS — D509 Iron deficiency anemia, unspecified: Secondary | ICD-10-CM | POA: Diagnosis not present

## 2015-11-24 DIAGNOSIS — E876 Hypokalemia: Secondary | ICD-10-CM | POA: Diagnosis not present

## 2015-11-28 DIAGNOSIS — N186 End stage renal disease: Secondary | ICD-10-CM | POA: Diagnosis not present

## 2015-11-28 DIAGNOSIS — E876 Hypokalemia: Secondary | ICD-10-CM | POA: Diagnosis not present

## 2015-11-28 DIAGNOSIS — D631 Anemia in chronic kidney disease: Secondary | ICD-10-CM | POA: Diagnosis not present

## 2015-11-28 DIAGNOSIS — D509 Iron deficiency anemia, unspecified: Secondary | ICD-10-CM | POA: Diagnosis not present

## 2015-11-30 DIAGNOSIS — N186 End stage renal disease: Secondary | ICD-10-CM | POA: Diagnosis not present

## 2015-11-30 DIAGNOSIS — D631 Anemia in chronic kidney disease: Secondary | ICD-10-CM | POA: Diagnosis not present

## 2015-11-30 DIAGNOSIS — D509 Iron deficiency anemia, unspecified: Secondary | ICD-10-CM | POA: Diagnosis not present

## 2015-11-30 DIAGNOSIS — E876 Hypokalemia: Secondary | ICD-10-CM | POA: Diagnosis not present

## 2015-12-02 DIAGNOSIS — N186 End stage renal disease: Secondary | ICD-10-CM | POA: Diagnosis not present

## 2015-12-02 DIAGNOSIS — E876 Hypokalemia: Secondary | ICD-10-CM | POA: Diagnosis not present

## 2015-12-02 DIAGNOSIS — D631 Anemia in chronic kidney disease: Secondary | ICD-10-CM | POA: Diagnosis not present

## 2015-12-02 DIAGNOSIS — D509 Iron deficiency anemia, unspecified: Secondary | ICD-10-CM | POA: Diagnosis not present

## 2015-12-05 DIAGNOSIS — E876 Hypokalemia: Secondary | ICD-10-CM | POA: Diagnosis not present

## 2015-12-05 DIAGNOSIS — D509 Iron deficiency anemia, unspecified: Secondary | ICD-10-CM | POA: Diagnosis not present

## 2015-12-05 DIAGNOSIS — N186 End stage renal disease: Secondary | ICD-10-CM | POA: Diagnosis not present

## 2015-12-05 DIAGNOSIS — D631 Anemia in chronic kidney disease: Secondary | ICD-10-CM | POA: Diagnosis not present

## 2015-12-07 DIAGNOSIS — D631 Anemia in chronic kidney disease: Secondary | ICD-10-CM | POA: Diagnosis not present

## 2015-12-07 DIAGNOSIS — N186 End stage renal disease: Secondary | ICD-10-CM | POA: Diagnosis not present

## 2015-12-07 DIAGNOSIS — D509 Iron deficiency anemia, unspecified: Secondary | ICD-10-CM | POA: Diagnosis not present

## 2015-12-07 DIAGNOSIS — E876 Hypokalemia: Secondary | ICD-10-CM | POA: Diagnosis not present

## 2015-12-09 DIAGNOSIS — E876 Hypokalemia: Secondary | ICD-10-CM | POA: Diagnosis not present

## 2015-12-09 DIAGNOSIS — D509 Iron deficiency anemia, unspecified: Secondary | ICD-10-CM | POA: Diagnosis not present

## 2015-12-09 DIAGNOSIS — N186 End stage renal disease: Secondary | ICD-10-CM | POA: Diagnosis not present

## 2015-12-09 DIAGNOSIS — D631 Anemia in chronic kidney disease: Secondary | ICD-10-CM | POA: Diagnosis not present

## 2015-12-12 DIAGNOSIS — D509 Iron deficiency anemia, unspecified: Secondary | ICD-10-CM | POA: Diagnosis not present

## 2015-12-12 DIAGNOSIS — D631 Anemia in chronic kidney disease: Secondary | ICD-10-CM | POA: Diagnosis not present

## 2015-12-12 DIAGNOSIS — E876 Hypokalemia: Secondary | ICD-10-CM | POA: Diagnosis not present

## 2015-12-12 DIAGNOSIS — N186 End stage renal disease: Secondary | ICD-10-CM | POA: Diagnosis not present

## 2015-12-14 DIAGNOSIS — D509 Iron deficiency anemia, unspecified: Secondary | ICD-10-CM | POA: Diagnosis not present

## 2015-12-14 DIAGNOSIS — E876 Hypokalemia: Secondary | ICD-10-CM | POA: Diagnosis not present

## 2015-12-14 DIAGNOSIS — D631 Anemia in chronic kidney disease: Secondary | ICD-10-CM | POA: Diagnosis not present

## 2015-12-14 DIAGNOSIS — N186 End stage renal disease: Secondary | ICD-10-CM | POA: Diagnosis not present

## 2015-12-16 DIAGNOSIS — Z992 Dependence on renal dialysis: Secondary | ICD-10-CM | POA: Diagnosis not present

## 2015-12-16 DIAGNOSIS — D509 Iron deficiency anemia, unspecified: Secondary | ICD-10-CM | POA: Diagnosis not present

## 2015-12-16 DIAGNOSIS — N186 End stage renal disease: Secondary | ICD-10-CM | POA: Diagnosis not present

## 2015-12-16 DIAGNOSIS — I120 Hypertensive chronic kidney disease with stage 5 chronic kidney disease or end stage renal disease: Secondary | ICD-10-CM | POA: Diagnosis not present

## 2015-12-16 DIAGNOSIS — D631 Anemia in chronic kidney disease: Secondary | ICD-10-CM | POA: Diagnosis not present

## 2015-12-16 DIAGNOSIS — E876 Hypokalemia: Secondary | ICD-10-CM | POA: Diagnosis not present

## 2015-12-19 DIAGNOSIS — D631 Anemia in chronic kidney disease: Secondary | ICD-10-CM | POA: Diagnosis not present

## 2015-12-19 DIAGNOSIS — N186 End stage renal disease: Secondary | ICD-10-CM | POA: Diagnosis not present

## 2015-12-19 DIAGNOSIS — E876 Hypokalemia: Secondary | ICD-10-CM | POA: Diagnosis not present

## 2015-12-19 DIAGNOSIS — D509 Iron deficiency anemia, unspecified: Secondary | ICD-10-CM | POA: Diagnosis not present

## 2015-12-19 DIAGNOSIS — Z23 Encounter for immunization: Secondary | ICD-10-CM | POA: Diagnosis not present

## 2015-12-21 DIAGNOSIS — N186 End stage renal disease: Secondary | ICD-10-CM | POA: Diagnosis not present

## 2015-12-21 DIAGNOSIS — D509 Iron deficiency anemia, unspecified: Secondary | ICD-10-CM | POA: Diagnosis not present

## 2015-12-21 DIAGNOSIS — I824Z9 Acute embolism and thrombosis of unspecified deep veins of unspecified distal lower extremity: Secondary | ICD-10-CM | POA: Diagnosis not present

## 2015-12-21 DIAGNOSIS — I82421 Acute embolism and thrombosis of right iliac vein: Secondary | ICD-10-CM | POA: Diagnosis not present

## 2015-12-21 DIAGNOSIS — E876 Hypokalemia: Secondary | ICD-10-CM | POA: Diagnosis not present

## 2015-12-21 DIAGNOSIS — D631 Anemia in chronic kidney disease: Secondary | ICD-10-CM | POA: Diagnosis not present

## 2015-12-21 DIAGNOSIS — Z23 Encounter for immunization: Secondary | ICD-10-CM | POA: Diagnosis not present

## 2015-12-23 DIAGNOSIS — E876 Hypokalemia: Secondary | ICD-10-CM | POA: Diagnosis not present

## 2015-12-23 DIAGNOSIS — D509 Iron deficiency anemia, unspecified: Secondary | ICD-10-CM | POA: Diagnosis not present

## 2015-12-23 DIAGNOSIS — N186 End stage renal disease: Secondary | ICD-10-CM | POA: Diagnosis not present

## 2015-12-23 DIAGNOSIS — D631 Anemia in chronic kidney disease: Secondary | ICD-10-CM | POA: Diagnosis not present

## 2015-12-23 DIAGNOSIS — Z23 Encounter for immunization: Secondary | ICD-10-CM | POA: Diagnosis not present

## 2015-12-26 DIAGNOSIS — N186 End stage renal disease: Secondary | ICD-10-CM | POA: Diagnosis not present

## 2015-12-26 DIAGNOSIS — E876 Hypokalemia: Secondary | ICD-10-CM | POA: Diagnosis not present

## 2015-12-26 DIAGNOSIS — Z23 Encounter for immunization: Secondary | ICD-10-CM | POA: Diagnosis not present

## 2015-12-26 DIAGNOSIS — D631 Anemia in chronic kidney disease: Secondary | ICD-10-CM | POA: Diagnosis not present

## 2015-12-26 DIAGNOSIS — D509 Iron deficiency anemia, unspecified: Secondary | ICD-10-CM | POA: Diagnosis not present

## 2015-12-28 DIAGNOSIS — Z23 Encounter for immunization: Secondary | ICD-10-CM | POA: Diagnosis not present

## 2015-12-28 DIAGNOSIS — D509 Iron deficiency anemia, unspecified: Secondary | ICD-10-CM | POA: Diagnosis not present

## 2015-12-28 DIAGNOSIS — N186 End stage renal disease: Secondary | ICD-10-CM | POA: Diagnosis not present

## 2015-12-28 DIAGNOSIS — E876 Hypokalemia: Secondary | ICD-10-CM | POA: Diagnosis not present

## 2015-12-28 DIAGNOSIS — D631 Anemia in chronic kidney disease: Secondary | ICD-10-CM | POA: Diagnosis not present

## 2015-12-30 DIAGNOSIS — Z23 Encounter for immunization: Secondary | ICD-10-CM | POA: Diagnosis not present

## 2015-12-30 DIAGNOSIS — D509 Iron deficiency anemia, unspecified: Secondary | ICD-10-CM | POA: Diagnosis not present

## 2015-12-30 DIAGNOSIS — E876 Hypokalemia: Secondary | ICD-10-CM | POA: Diagnosis not present

## 2015-12-30 DIAGNOSIS — D631 Anemia in chronic kidney disease: Secondary | ICD-10-CM | POA: Diagnosis not present

## 2015-12-30 DIAGNOSIS — N186 End stage renal disease: Secondary | ICD-10-CM | POA: Diagnosis not present

## 2016-01-02 DIAGNOSIS — E876 Hypokalemia: Secondary | ICD-10-CM | POA: Diagnosis not present

## 2016-01-02 DIAGNOSIS — D631 Anemia in chronic kidney disease: Secondary | ICD-10-CM | POA: Diagnosis not present

## 2016-01-02 DIAGNOSIS — N186 End stage renal disease: Secondary | ICD-10-CM | POA: Diagnosis not present

## 2016-01-02 DIAGNOSIS — D509 Iron deficiency anemia, unspecified: Secondary | ICD-10-CM | POA: Diagnosis not present

## 2016-01-02 DIAGNOSIS — Z23 Encounter for immunization: Secondary | ICD-10-CM | POA: Diagnosis not present

## 2016-01-04 DIAGNOSIS — E876 Hypokalemia: Secondary | ICD-10-CM | POA: Diagnosis not present

## 2016-01-04 DIAGNOSIS — D509 Iron deficiency anemia, unspecified: Secondary | ICD-10-CM | POA: Diagnosis not present

## 2016-01-04 DIAGNOSIS — N186 End stage renal disease: Secondary | ICD-10-CM | POA: Diagnosis not present

## 2016-01-04 DIAGNOSIS — D631 Anemia in chronic kidney disease: Secondary | ICD-10-CM | POA: Diagnosis not present

## 2016-01-04 DIAGNOSIS — Z23 Encounter for immunization: Secondary | ICD-10-CM | POA: Diagnosis not present

## 2016-01-06 DIAGNOSIS — E876 Hypokalemia: Secondary | ICD-10-CM | POA: Diagnosis not present

## 2016-01-06 DIAGNOSIS — Z23 Encounter for immunization: Secondary | ICD-10-CM | POA: Diagnosis not present

## 2016-01-06 DIAGNOSIS — D631 Anemia in chronic kidney disease: Secondary | ICD-10-CM | POA: Diagnosis not present

## 2016-01-06 DIAGNOSIS — N186 End stage renal disease: Secondary | ICD-10-CM | POA: Diagnosis not present

## 2016-01-06 DIAGNOSIS — D509 Iron deficiency anemia, unspecified: Secondary | ICD-10-CM | POA: Diagnosis not present

## 2016-01-08 DIAGNOSIS — M86669 Other chronic osteomyelitis, unspecified tibia and fibula: Secondary | ICD-10-CM | POA: Diagnosis not present

## 2016-01-08 DIAGNOSIS — L0291 Cutaneous abscess, unspecified: Secondary | ICD-10-CM | POA: Diagnosis not present

## 2016-01-09 DIAGNOSIS — D509 Iron deficiency anemia, unspecified: Secondary | ICD-10-CM | POA: Diagnosis not present

## 2016-01-09 DIAGNOSIS — D631 Anemia in chronic kidney disease: Secondary | ICD-10-CM | POA: Diagnosis not present

## 2016-01-09 DIAGNOSIS — E876 Hypokalemia: Secondary | ICD-10-CM | POA: Diagnosis not present

## 2016-01-09 DIAGNOSIS — Z23 Encounter for immunization: Secondary | ICD-10-CM | POA: Diagnosis not present

## 2016-01-09 DIAGNOSIS — N186 End stage renal disease: Secondary | ICD-10-CM | POA: Diagnosis not present

## 2016-01-11 DIAGNOSIS — D631 Anemia in chronic kidney disease: Secondary | ICD-10-CM | POA: Diagnosis not present

## 2016-01-11 DIAGNOSIS — E876 Hypokalemia: Secondary | ICD-10-CM | POA: Diagnosis not present

## 2016-01-11 DIAGNOSIS — Z23 Encounter for immunization: Secondary | ICD-10-CM | POA: Diagnosis not present

## 2016-01-11 DIAGNOSIS — D509 Iron deficiency anemia, unspecified: Secondary | ICD-10-CM | POA: Diagnosis not present

## 2016-01-11 DIAGNOSIS — N186 End stage renal disease: Secondary | ICD-10-CM | POA: Diagnosis not present

## 2016-01-13 DIAGNOSIS — D509 Iron deficiency anemia, unspecified: Secondary | ICD-10-CM | POA: Diagnosis not present

## 2016-01-13 DIAGNOSIS — Z23 Encounter for immunization: Secondary | ICD-10-CM | POA: Diagnosis not present

## 2016-01-13 DIAGNOSIS — D631 Anemia in chronic kidney disease: Secondary | ICD-10-CM | POA: Diagnosis not present

## 2016-01-13 DIAGNOSIS — E876 Hypokalemia: Secondary | ICD-10-CM | POA: Diagnosis not present

## 2016-01-13 DIAGNOSIS — N186 End stage renal disease: Secondary | ICD-10-CM | POA: Diagnosis not present

## 2016-01-15 ENCOUNTER — Other Ambulatory Visit: Payer: Self-pay | Admitting: Internal Medicine

## 2016-01-15 DIAGNOSIS — N186 End stage renal disease: Secondary | ICD-10-CM | POA: Diagnosis not present

## 2016-01-15 DIAGNOSIS — Z992 Dependence on renal dialysis: Secondary | ICD-10-CM | POA: Diagnosis not present

## 2016-01-15 DIAGNOSIS — I120 Hypertensive chronic kidney disease with stage 5 chronic kidney disease or end stage renal disease: Secondary | ICD-10-CM | POA: Diagnosis not present

## 2016-01-16 DIAGNOSIS — E876 Hypokalemia: Secondary | ICD-10-CM | POA: Diagnosis not present

## 2016-01-16 DIAGNOSIS — D509 Iron deficiency anemia, unspecified: Secondary | ICD-10-CM | POA: Diagnosis not present

## 2016-01-16 DIAGNOSIS — D631 Anemia in chronic kidney disease: Secondary | ICD-10-CM | POA: Diagnosis not present

## 2016-01-16 DIAGNOSIS — Z23 Encounter for immunization: Secondary | ICD-10-CM | POA: Diagnosis not present

## 2016-01-16 DIAGNOSIS — N186 End stage renal disease: Secondary | ICD-10-CM | POA: Diagnosis not present

## 2016-01-18 DIAGNOSIS — D509 Iron deficiency anemia, unspecified: Secondary | ICD-10-CM | POA: Diagnosis not present

## 2016-01-18 DIAGNOSIS — N186 End stage renal disease: Secondary | ICD-10-CM | POA: Diagnosis not present

## 2016-01-18 DIAGNOSIS — I159 Secondary hypertension, unspecified: Secondary | ICD-10-CM | POA: Diagnosis not present

## 2016-01-18 DIAGNOSIS — E876 Hypokalemia: Secondary | ICD-10-CM | POA: Diagnosis not present

## 2016-01-18 DIAGNOSIS — D631 Anemia in chronic kidney disease: Secondary | ICD-10-CM | POA: Diagnosis not present

## 2016-01-20 DIAGNOSIS — D509 Iron deficiency anemia, unspecified: Secondary | ICD-10-CM | POA: Diagnosis not present

## 2016-01-20 DIAGNOSIS — N186 End stage renal disease: Secondary | ICD-10-CM | POA: Diagnosis not present

## 2016-01-20 DIAGNOSIS — I159 Secondary hypertension, unspecified: Secondary | ICD-10-CM | POA: Diagnosis not present

## 2016-01-20 DIAGNOSIS — D631 Anemia in chronic kidney disease: Secondary | ICD-10-CM | POA: Diagnosis not present

## 2016-01-20 DIAGNOSIS — E876 Hypokalemia: Secondary | ICD-10-CM | POA: Diagnosis not present

## 2016-01-23 DIAGNOSIS — I159 Secondary hypertension, unspecified: Secondary | ICD-10-CM | POA: Diagnosis not present

## 2016-01-23 DIAGNOSIS — D509 Iron deficiency anemia, unspecified: Secondary | ICD-10-CM | POA: Diagnosis not present

## 2016-01-23 DIAGNOSIS — D631 Anemia in chronic kidney disease: Secondary | ICD-10-CM | POA: Diagnosis not present

## 2016-01-23 DIAGNOSIS — N186 End stage renal disease: Secondary | ICD-10-CM | POA: Diagnosis not present

## 2016-01-23 DIAGNOSIS — E876 Hypokalemia: Secondary | ICD-10-CM | POA: Diagnosis not present

## 2016-01-25 DIAGNOSIS — E876 Hypokalemia: Secondary | ICD-10-CM | POA: Diagnosis not present

## 2016-01-25 DIAGNOSIS — D509 Iron deficiency anemia, unspecified: Secondary | ICD-10-CM | POA: Diagnosis not present

## 2016-01-25 DIAGNOSIS — D631 Anemia in chronic kidney disease: Secondary | ICD-10-CM | POA: Diagnosis not present

## 2016-01-25 DIAGNOSIS — I159 Secondary hypertension, unspecified: Secondary | ICD-10-CM | POA: Diagnosis not present

## 2016-01-25 DIAGNOSIS — N186 End stage renal disease: Secondary | ICD-10-CM | POA: Diagnosis not present

## 2016-01-27 DIAGNOSIS — E876 Hypokalemia: Secondary | ICD-10-CM | POA: Diagnosis not present

## 2016-01-27 DIAGNOSIS — N186 End stage renal disease: Secondary | ICD-10-CM | POA: Diagnosis not present

## 2016-01-27 DIAGNOSIS — I159 Secondary hypertension, unspecified: Secondary | ICD-10-CM | POA: Diagnosis not present

## 2016-01-27 DIAGNOSIS — D631 Anemia in chronic kidney disease: Secondary | ICD-10-CM | POA: Diagnosis not present

## 2016-01-27 DIAGNOSIS — D509 Iron deficiency anemia, unspecified: Secondary | ICD-10-CM | POA: Diagnosis not present

## 2016-01-30 DIAGNOSIS — E876 Hypokalemia: Secondary | ICD-10-CM | POA: Diagnosis not present

## 2016-01-30 DIAGNOSIS — D631 Anemia in chronic kidney disease: Secondary | ICD-10-CM | POA: Diagnosis not present

## 2016-01-30 DIAGNOSIS — I159 Secondary hypertension, unspecified: Secondary | ICD-10-CM | POA: Diagnosis not present

## 2016-01-30 DIAGNOSIS — D509 Iron deficiency anemia, unspecified: Secondary | ICD-10-CM | POA: Diagnosis not present

## 2016-01-30 DIAGNOSIS — N186 End stage renal disease: Secondary | ICD-10-CM | POA: Diagnosis not present

## 2016-02-01 DIAGNOSIS — D509 Iron deficiency anemia, unspecified: Secondary | ICD-10-CM | POA: Diagnosis not present

## 2016-02-01 DIAGNOSIS — N186 End stage renal disease: Secondary | ICD-10-CM | POA: Diagnosis not present

## 2016-02-01 DIAGNOSIS — D631 Anemia in chronic kidney disease: Secondary | ICD-10-CM | POA: Diagnosis not present

## 2016-02-01 DIAGNOSIS — I159 Secondary hypertension, unspecified: Secondary | ICD-10-CM | POA: Diagnosis not present

## 2016-02-01 DIAGNOSIS — E876 Hypokalemia: Secondary | ICD-10-CM | POA: Diagnosis not present

## 2016-02-03 DIAGNOSIS — I159 Secondary hypertension, unspecified: Secondary | ICD-10-CM | POA: Diagnosis not present

## 2016-02-03 DIAGNOSIS — E876 Hypokalemia: Secondary | ICD-10-CM | POA: Diagnosis not present

## 2016-02-03 DIAGNOSIS — N186 End stage renal disease: Secondary | ICD-10-CM | POA: Diagnosis not present

## 2016-02-03 DIAGNOSIS — D509 Iron deficiency anemia, unspecified: Secondary | ICD-10-CM | POA: Diagnosis not present

## 2016-02-03 DIAGNOSIS — D631 Anemia in chronic kidney disease: Secondary | ICD-10-CM | POA: Diagnosis not present

## 2016-02-05 DIAGNOSIS — D631 Anemia in chronic kidney disease: Secondary | ICD-10-CM | POA: Diagnosis not present

## 2016-02-05 DIAGNOSIS — I159 Secondary hypertension, unspecified: Secondary | ICD-10-CM | POA: Diagnosis not present

## 2016-02-05 DIAGNOSIS — D509 Iron deficiency anemia, unspecified: Secondary | ICD-10-CM | POA: Diagnosis not present

## 2016-02-05 DIAGNOSIS — E876 Hypokalemia: Secondary | ICD-10-CM | POA: Diagnosis not present

## 2016-02-05 DIAGNOSIS — N186 End stage renal disease: Secondary | ICD-10-CM | POA: Diagnosis not present

## 2016-02-07 DIAGNOSIS — N186 End stage renal disease: Secondary | ICD-10-CM | POA: Diagnosis not present

## 2016-02-07 DIAGNOSIS — D509 Iron deficiency anemia, unspecified: Secondary | ICD-10-CM | POA: Diagnosis not present

## 2016-02-07 DIAGNOSIS — I159 Secondary hypertension, unspecified: Secondary | ICD-10-CM | POA: Diagnosis not present

## 2016-02-07 DIAGNOSIS — D631 Anemia in chronic kidney disease: Secondary | ICD-10-CM | POA: Diagnosis not present

## 2016-02-07 DIAGNOSIS — E876 Hypokalemia: Secondary | ICD-10-CM | POA: Diagnosis not present

## 2016-02-10 DIAGNOSIS — D509 Iron deficiency anemia, unspecified: Secondary | ICD-10-CM | POA: Diagnosis not present

## 2016-02-10 DIAGNOSIS — N186 End stage renal disease: Secondary | ICD-10-CM | POA: Diagnosis not present

## 2016-02-10 DIAGNOSIS — I159 Secondary hypertension, unspecified: Secondary | ICD-10-CM | POA: Diagnosis not present

## 2016-02-10 DIAGNOSIS — D631 Anemia in chronic kidney disease: Secondary | ICD-10-CM | POA: Diagnosis not present

## 2016-02-10 DIAGNOSIS — E876 Hypokalemia: Secondary | ICD-10-CM | POA: Diagnosis not present

## 2016-02-13 DIAGNOSIS — E876 Hypokalemia: Secondary | ICD-10-CM | POA: Diagnosis not present

## 2016-02-13 DIAGNOSIS — D509 Iron deficiency anemia, unspecified: Secondary | ICD-10-CM | POA: Diagnosis not present

## 2016-02-13 DIAGNOSIS — D631 Anemia in chronic kidney disease: Secondary | ICD-10-CM | POA: Diagnosis not present

## 2016-02-13 DIAGNOSIS — N186 End stage renal disease: Secondary | ICD-10-CM | POA: Diagnosis not present

## 2016-02-13 DIAGNOSIS — I159 Secondary hypertension, unspecified: Secondary | ICD-10-CM | POA: Diagnosis not present

## 2016-02-15 DIAGNOSIS — I159 Secondary hypertension, unspecified: Secondary | ICD-10-CM | POA: Diagnosis not present

## 2016-02-15 DIAGNOSIS — I120 Hypertensive chronic kidney disease with stage 5 chronic kidney disease or end stage renal disease: Secondary | ICD-10-CM | POA: Diagnosis not present

## 2016-02-15 DIAGNOSIS — E876 Hypokalemia: Secondary | ICD-10-CM | POA: Diagnosis not present

## 2016-02-15 DIAGNOSIS — Z992 Dependence on renal dialysis: Secondary | ICD-10-CM | POA: Diagnosis not present

## 2016-02-15 DIAGNOSIS — D631 Anemia in chronic kidney disease: Secondary | ICD-10-CM | POA: Diagnosis not present

## 2016-02-15 DIAGNOSIS — N186 End stage renal disease: Secondary | ICD-10-CM | POA: Diagnosis not present

## 2016-02-15 DIAGNOSIS — D509 Iron deficiency anemia, unspecified: Secondary | ICD-10-CM | POA: Diagnosis not present

## 2016-02-17 DIAGNOSIS — E876 Hypokalemia: Secondary | ICD-10-CM | POA: Diagnosis not present

## 2016-02-17 DIAGNOSIS — D631 Anemia in chronic kidney disease: Secondary | ICD-10-CM | POA: Diagnosis not present

## 2016-02-17 DIAGNOSIS — I159 Secondary hypertension, unspecified: Secondary | ICD-10-CM | POA: Diagnosis not present

## 2016-02-17 DIAGNOSIS — N186 End stage renal disease: Secondary | ICD-10-CM | POA: Diagnosis not present

## 2016-02-17 DIAGNOSIS — D509 Iron deficiency anemia, unspecified: Secondary | ICD-10-CM | POA: Diagnosis not present

## 2016-02-20 DIAGNOSIS — E876 Hypokalemia: Secondary | ICD-10-CM | POA: Diagnosis not present

## 2016-02-20 DIAGNOSIS — N186 End stage renal disease: Secondary | ICD-10-CM | POA: Diagnosis not present

## 2016-02-20 DIAGNOSIS — D631 Anemia in chronic kidney disease: Secondary | ICD-10-CM | POA: Diagnosis not present

## 2016-02-20 DIAGNOSIS — D509 Iron deficiency anemia, unspecified: Secondary | ICD-10-CM | POA: Diagnosis not present

## 2016-02-20 DIAGNOSIS — I159 Secondary hypertension, unspecified: Secondary | ICD-10-CM | POA: Diagnosis not present

## 2016-02-22 DIAGNOSIS — E876 Hypokalemia: Secondary | ICD-10-CM | POA: Diagnosis not present

## 2016-02-22 DIAGNOSIS — I120 Hypertensive chronic kidney disease with stage 5 chronic kidney disease or end stage renal disease: Secondary | ICD-10-CM | POA: Diagnosis not present

## 2016-02-22 DIAGNOSIS — Z992 Dependence on renal dialysis: Secondary | ICD-10-CM | POA: Diagnosis not present

## 2016-02-22 DIAGNOSIS — I159 Secondary hypertension, unspecified: Secondary | ICD-10-CM | POA: Diagnosis not present

## 2016-02-22 DIAGNOSIS — D631 Anemia in chronic kidney disease: Secondary | ICD-10-CM | POA: Diagnosis not present

## 2016-02-22 DIAGNOSIS — D509 Iron deficiency anemia, unspecified: Secondary | ICD-10-CM | POA: Diagnosis not present

## 2016-02-22 DIAGNOSIS — N186 End stage renal disease: Secondary | ICD-10-CM | POA: Diagnosis not present

## 2016-02-24 DIAGNOSIS — I159 Secondary hypertension, unspecified: Secondary | ICD-10-CM | POA: Diagnosis not present

## 2016-02-24 DIAGNOSIS — E876 Hypokalemia: Secondary | ICD-10-CM | POA: Diagnosis not present

## 2016-02-24 DIAGNOSIS — N186 End stage renal disease: Secondary | ICD-10-CM | POA: Diagnosis not present

## 2016-02-24 DIAGNOSIS — D631 Anemia in chronic kidney disease: Secondary | ICD-10-CM | POA: Diagnosis not present

## 2016-02-24 DIAGNOSIS — D509 Iron deficiency anemia, unspecified: Secondary | ICD-10-CM | POA: Diagnosis not present

## 2016-02-27 DIAGNOSIS — D631 Anemia in chronic kidney disease: Secondary | ICD-10-CM | POA: Diagnosis not present

## 2016-02-27 DIAGNOSIS — N186 End stage renal disease: Secondary | ICD-10-CM | POA: Diagnosis not present

## 2016-02-27 DIAGNOSIS — D509 Iron deficiency anemia, unspecified: Secondary | ICD-10-CM | POA: Diagnosis not present

## 2016-02-27 DIAGNOSIS — E876 Hypokalemia: Secondary | ICD-10-CM | POA: Diagnosis not present

## 2016-02-27 DIAGNOSIS — I159 Secondary hypertension, unspecified: Secondary | ICD-10-CM | POA: Diagnosis not present

## 2016-02-29 DIAGNOSIS — D509 Iron deficiency anemia, unspecified: Secondary | ICD-10-CM | POA: Diagnosis not present

## 2016-02-29 DIAGNOSIS — D631 Anemia in chronic kidney disease: Secondary | ICD-10-CM | POA: Diagnosis not present

## 2016-02-29 DIAGNOSIS — N186 End stage renal disease: Secondary | ICD-10-CM | POA: Diagnosis not present

## 2016-02-29 DIAGNOSIS — I159 Secondary hypertension, unspecified: Secondary | ICD-10-CM | POA: Diagnosis not present

## 2016-02-29 DIAGNOSIS — E876 Hypokalemia: Secondary | ICD-10-CM | POA: Diagnosis not present

## 2016-03-02 DIAGNOSIS — I159 Secondary hypertension, unspecified: Secondary | ICD-10-CM | POA: Diagnosis not present

## 2016-03-02 DIAGNOSIS — N186 End stage renal disease: Secondary | ICD-10-CM | POA: Diagnosis not present

## 2016-03-02 DIAGNOSIS — D631 Anemia in chronic kidney disease: Secondary | ICD-10-CM | POA: Diagnosis not present

## 2016-03-02 DIAGNOSIS — D509 Iron deficiency anemia, unspecified: Secondary | ICD-10-CM | POA: Diagnosis not present

## 2016-03-02 DIAGNOSIS — E876 Hypokalemia: Secondary | ICD-10-CM | POA: Diagnosis not present

## 2016-03-05 DIAGNOSIS — D509 Iron deficiency anemia, unspecified: Secondary | ICD-10-CM | POA: Diagnosis not present

## 2016-03-05 DIAGNOSIS — E876 Hypokalemia: Secondary | ICD-10-CM | POA: Diagnosis not present

## 2016-03-05 DIAGNOSIS — D631 Anemia in chronic kidney disease: Secondary | ICD-10-CM | POA: Diagnosis not present

## 2016-03-05 DIAGNOSIS — I159 Secondary hypertension, unspecified: Secondary | ICD-10-CM | POA: Diagnosis not present

## 2016-03-05 DIAGNOSIS — N186 End stage renal disease: Secondary | ICD-10-CM | POA: Diagnosis not present

## 2016-03-07 DIAGNOSIS — E876 Hypokalemia: Secondary | ICD-10-CM | POA: Diagnosis not present

## 2016-03-07 DIAGNOSIS — I159 Secondary hypertension, unspecified: Secondary | ICD-10-CM | POA: Diagnosis not present

## 2016-03-07 DIAGNOSIS — D631 Anemia in chronic kidney disease: Secondary | ICD-10-CM | POA: Diagnosis not present

## 2016-03-07 DIAGNOSIS — N186 End stage renal disease: Secondary | ICD-10-CM | POA: Diagnosis not present

## 2016-03-07 DIAGNOSIS — D509 Iron deficiency anemia, unspecified: Secondary | ICD-10-CM | POA: Diagnosis not present

## 2016-03-09 DIAGNOSIS — N186 End stage renal disease: Secondary | ICD-10-CM | POA: Diagnosis not present

## 2016-03-09 DIAGNOSIS — I159 Secondary hypertension, unspecified: Secondary | ICD-10-CM | POA: Diagnosis not present

## 2016-03-09 DIAGNOSIS — E876 Hypokalemia: Secondary | ICD-10-CM | POA: Diagnosis not present

## 2016-03-09 DIAGNOSIS — D509 Iron deficiency anemia, unspecified: Secondary | ICD-10-CM | POA: Diagnosis not present

## 2016-03-09 DIAGNOSIS — D631 Anemia in chronic kidney disease: Secondary | ICD-10-CM | POA: Diagnosis not present

## 2016-03-12 DIAGNOSIS — I159 Secondary hypertension, unspecified: Secondary | ICD-10-CM | POA: Diagnosis not present

## 2016-03-12 DIAGNOSIS — E876 Hypokalemia: Secondary | ICD-10-CM | POA: Diagnosis not present

## 2016-03-12 DIAGNOSIS — N186 End stage renal disease: Secondary | ICD-10-CM | POA: Diagnosis not present

## 2016-03-12 DIAGNOSIS — D631 Anemia in chronic kidney disease: Secondary | ICD-10-CM | POA: Diagnosis not present

## 2016-03-12 DIAGNOSIS — D509 Iron deficiency anemia, unspecified: Secondary | ICD-10-CM | POA: Diagnosis not present

## 2016-03-14 DIAGNOSIS — D509 Iron deficiency anemia, unspecified: Secondary | ICD-10-CM | POA: Diagnosis not present

## 2016-03-14 DIAGNOSIS — N186 End stage renal disease: Secondary | ICD-10-CM | POA: Diagnosis not present

## 2016-03-14 DIAGNOSIS — E876 Hypokalemia: Secondary | ICD-10-CM | POA: Diagnosis not present

## 2016-03-14 DIAGNOSIS — I159 Secondary hypertension, unspecified: Secondary | ICD-10-CM | POA: Diagnosis not present

## 2016-03-14 DIAGNOSIS — D631 Anemia in chronic kidney disease: Secondary | ICD-10-CM | POA: Diagnosis not present

## 2016-03-16 DIAGNOSIS — D509 Iron deficiency anemia, unspecified: Secondary | ICD-10-CM | POA: Diagnosis not present

## 2016-03-16 DIAGNOSIS — D631 Anemia in chronic kidney disease: Secondary | ICD-10-CM | POA: Diagnosis not present

## 2016-03-16 DIAGNOSIS — I159 Secondary hypertension, unspecified: Secondary | ICD-10-CM | POA: Diagnosis not present

## 2016-03-16 DIAGNOSIS — E876 Hypokalemia: Secondary | ICD-10-CM | POA: Diagnosis not present

## 2016-03-16 DIAGNOSIS — N186 End stage renal disease: Secondary | ICD-10-CM | POA: Diagnosis not present

## 2016-03-19 DIAGNOSIS — D509 Iron deficiency anemia, unspecified: Secondary | ICD-10-CM | POA: Diagnosis not present

## 2016-03-19 DIAGNOSIS — I159 Secondary hypertension, unspecified: Secondary | ICD-10-CM | POA: Diagnosis not present

## 2016-03-19 DIAGNOSIS — E876 Hypokalemia: Secondary | ICD-10-CM | POA: Diagnosis not present

## 2016-03-19 DIAGNOSIS — D631 Anemia in chronic kidney disease: Secondary | ICD-10-CM | POA: Diagnosis not present

## 2016-03-19 DIAGNOSIS — N186 End stage renal disease: Secondary | ICD-10-CM | POA: Diagnosis not present

## 2016-03-21 DIAGNOSIS — D631 Anemia in chronic kidney disease: Secondary | ICD-10-CM | POA: Diagnosis not present

## 2016-03-21 DIAGNOSIS — D509 Iron deficiency anemia, unspecified: Secondary | ICD-10-CM | POA: Diagnosis not present

## 2016-03-21 DIAGNOSIS — E876 Hypokalemia: Secondary | ICD-10-CM | POA: Diagnosis not present

## 2016-03-21 DIAGNOSIS — N186 End stage renal disease: Secondary | ICD-10-CM | POA: Diagnosis not present

## 2016-03-21 DIAGNOSIS — I159 Secondary hypertension, unspecified: Secondary | ICD-10-CM | POA: Diagnosis not present

## 2016-03-23 DIAGNOSIS — N186 End stage renal disease: Secondary | ICD-10-CM | POA: Diagnosis not present

## 2016-03-23 DIAGNOSIS — D631 Anemia in chronic kidney disease: Secondary | ICD-10-CM | POA: Diagnosis not present

## 2016-03-23 DIAGNOSIS — D509 Iron deficiency anemia, unspecified: Secondary | ICD-10-CM | POA: Diagnosis not present

## 2016-03-23 DIAGNOSIS — E876 Hypokalemia: Secondary | ICD-10-CM | POA: Diagnosis not present

## 2016-03-23 DIAGNOSIS — I159 Secondary hypertension, unspecified: Secondary | ICD-10-CM | POA: Diagnosis not present

## 2016-03-26 DIAGNOSIS — I159 Secondary hypertension, unspecified: Secondary | ICD-10-CM | POA: Diagnosis not present

## 2016-03-26 DIAGNOSIS — D509 Iron deficiency anemia, unspecified: Secondary | ICD-10-CM | POA: Diagnosis not present

## 2016-03-26 DIAGNOSIS — N186 End stage renal disease: Secondary | ICD-10-CM | POA: Diagnosis not present

## 2016-03-26 DIAGNOSIS — D631 Anemia in chronic kidney disease: Secondary | ICD-10-CM | POA: Diagnosis not present

## 2016-03-26 DIAGNOSIS — E876 Hypokalemia: Secondary | ICD-10-CM | POA: Diagnosis not present

## 2016-03-28 DIAGNOSIS — I159 Secondary hypertension, unspecified: Secondary | ICD-10-CM | POA: Diagnosis not present

## 2016-03-28 DIAGNOSIS — D509 Iron deficiency anemia, unspecified: Secondary | ICD-10-CM | POA: Diagnosis not present

## 2016-03-28 DIAGNOSIS — D631 Anemia in chronic kidney disease: Secondary | ICD-10-CM | POA: Diagnosis not present

## 2016-03-28 DIAGNOSIS — N186 End stage renal disease: Secondary | ICD-10-CM | POA: Diagnosis not present

## 2016-03-28 DIAGNOSIS — E876 Hypokalemia: Secondary | ICD-10-CM | POA: Diagnosis not present

## 2016-03-30 DIAGNOSIS — I159 Secondary hypertension, unspecified: Secondary | ICD-10-CM | POA: Diagnosis not present

## 2016-03-30 DIAGNOSIS — D631 Anemia in chronic kidney disease: Secondary | ICD-10-CM | POA: Diagnosis not present

## 2016-03-30 DIAGNOSIS — D509 Iron deficiency anemia, unspecified: Secondary | ICD-10-CM | POA: Diagnosis not present

## 2016-03-30 DIAGNOSIS — E876 Hypokalemia: Secondary | ICD-10-CM | POA: Diagnosis not present

## 2016-03-30 DIAGNOSIS — N186 End stage renal disease: Secondary | ICD-10-CM | POA: Diagnosis not present

## 2016-04-01 ENCOUNTER — Other Ambulatory Visit: Payer: Self-pay | Admitting: Internal Medicine

## 2016-04-02 DIAGNOSIS — D509 Iron deficiency anemia, unspecified: Secondary | ICD-10-CM | POA: Diagnosis not present

## 2016-04-02 DIAGNOSIS — I159 Secondary hypertension, unspecified: Secondary | ICD-10-CM | POA: Diagnosis not present

## 2016-04-02 DIAGNOSIS — E876 Hypokalemia: Secondary | ICD-10-CM | POA: Diagnosis not present

## 2016-04-02 DIAGNOSIS — D631 Anemia in chronic kidney disease: Secondary | ICD-10-CM | POA: Diagnosis not present

## 2016-04-02 DIAGNOSIS — N186 End stage renal disease: Secondary | ICD-10-CM | POA: Diagnosis not present

## 2016-04-04 DIAGNOSIS — D509 Iron deficiency anemia, unspecified: Secondary | ICD-10-CM | POA: Diagnosis not present

## 2016-04-04 DIAGNOSIS — E876 Hypokalemia: Secondary | ICD-10-CM | POA: Diagnosis not present

## 2016-04-04 DIAGNOSIS — D631 Anemia in chronic kidney disease: Secondary | ICD-10-CM | POA: Diagnosis not present

## 2016-04-04 DIAGNOSIS — I159 Secondary hypertension, unspecified: Secondary | ICD-10-CM | POA: Diagnosis not present

## 2016-04-04 DIAGNOSIS — N186 End stage renal disease: Secondary | ICD-10-CM | POA: Diagnosis not present

## 2016-04-06 DIAGNOSIS — D631 Anemia in chronic kidney disease: Secondary | ICD-10-CM | POA: Diagnosis not present

## 2016-04-06 DIAGNOSIS — N186 End stage renal disease: Secondary | ICD-10-CM | POA: Diagnosis not present

## 2016-04-06 DIAGNOSIS — I159 Secondary hypertension, unspecified: Secondary | ICD-10-CM | POA: Diagnosis not present

## 2016-04-06 DIAGNOSIS — E876 Hypokalemia: Secondary | ICD-10-CM | POA: Diagnosis not present

## 2016-04-06 DIAGNOSIS — D509 Iron deficiency anemia, unspecified: Secondary | ICD-10-CM | POA: Diagnosis not present

## 2016-04-09 DIAGNOSIS — I159 Secondary hypertension, unspecified: Secondary | ICD-10-CM | POA: Diagnosis not present

## 2016-04-09 DIAGNOSIS — D509 Iron deficiency anemia, unspecified: Secondary | ICD-10-CM | POA: Diagnosis not present

## 2016-04-09 DIAGNOSIS — N186 End stage renal disease: Secondary | ICD-10-CM | POA: Diagnosis not present

## 2016-04-09 DIAGNOSIS — D631 Anemia in chronic kidney disease: Secondary | ICD-10-CM | POA: Diagnosis not present

## 2016-04-09 DIAGNOSIS — E876 Hypokalemia: Secondary | ICD-10-CM | POA: Diagnosis not present

## 2016-04-10 DIAGNOSIS — M25561 Pain in right knee: Secondary | ICD-10-CM | POA: Diagnosis not present

## 2016-04-11 DIAGNOSIS — N186 End stage renal disease: Secondary | ICD-10-CM | POA: Diagnosis not present

## 2016-04-11 DIAGNOSIS — E876 Hypokalemia: Secondary | ICD-10-CM | POA: Diagnosis not present

## 2016-04-11 DIAGNOSIS — D631 Anemia in chronic kidney disease: Secondary | ICD-10-CM | POA: Diagnosis not present

## 2016-04-11 DIAGNOSIS — D509 Iron deficiency anemia, unspecified: Secondary | ICD-10-CM | POA: Diagnosis not present

## 2016-04-11 DIAGNOSIS — I159 Secondary hypertension, unspecified: Secondary | ICD-10-CM | POA: Diagnosis not present

## 2016-04-13 DIAGNOSIS — I159 Secondary hypertension, unspecified: Secondary | ICD-10-CM | POA: Diagnosis not present

## 2016-04-13 DIAGNOSIS — D509 Iron deficiency anemia, unspecified: Secondary | ICD-10-CM | POA: Diagnosis not present

## 2016-04-13 DIAGNOSIS — N186 End stage renal disease: Secondary | ICD-10-CM | POA: Diagnosis not present

## 2016-04-13 DIAGNOSIS — E876 Hypokalemia: Secondary | ICD-10-CM | POA: Diagnosis not present

## 2016-04-13 DIAGNOSIS — D631 Anemia in chronic kidney disease: Secondary | ICD-10-CM | POA: Diagnosis not present

## 2016-04-16 DIAGNOSIS — D509 Iron deficiency anemia, unspecified: Secondary | ICD-10-CM | POA: Diagnosis not present

## 2016-04-16 DIAGNOSIS — I120 Hypertensive chronic kidney disease with stage 5 chronic kidney disease or end stage renal disease: Secondary | ICD-10-CM | POA: Diagnosis not present

## 2016-04-16 DIAGNOSIS — E876 Hypokalemia: Secondary | ICD-10-CM | POA: Diagnosis not present

## 2016-04-16 DIAGNOSIS — D631 Anemia in chronic kidney disease: Secondary | ICD-10-CM | POA: Diagnosis not present

## 2016-04-16 DIAGNOSIS — I159 Secondary hypertension, unspecified: Secondary | ICD-10-CM | POA: Diagnosis not present

## 2016-04-16 DIAGNOSIS — N186 End stage renal disease: Secondary | ICD-10-CM | POA: Diagnosis not present

## 2016-04-16 DIAGNOSIS — Z992 Dependence on renal dialysis: Secondary | ICD-10-CM | POA: Diagnosis not present

## 2016-04-18 DIAGNOSIS — E876 Hypokalemia: Secondary | ICD-10-CM | POA: Diagnosis not present

## 2016-04-18 DIAGNOSIS — I159 Secondary hypertension, unspecified: Secondary | ICD-10-CM | POA: Diagnosis not present

## 2016-04-18 DIAGNOSIS — D631 Anemia in chronic kidney disease: Secondary | ICD-10-CM | POA: Diagnosis not present

## 2016-04-18 DIAGNOSIS — Z992 Dependence on renal dialysis: Secondary | ICD-10-CM | POA: Diagnosis not present

## 2016-04-18 DIAGNOSIS — N186 End stage renal disease: Secondary | ICD-10-CM | POA: Diagnosis not present

## 2016-04-18 DIAGNOSIS — D509 Iron deficiency anemia, unspecified: Secondary | ICD-10-CM | POA: Diagnosis not present

## 2016-04-18 DIAGNOSIS — I120 Hypertensive chronic kidney disease with stage 5 chronic kidney disease or end stage renal disease: Secondary | ICD-10-CM | POA: Diagnosis not present

## 2016-04-20 DIAGNOSIS — D631 Anemia in chronic kidney disease: Secondary | ICD-10-CM | POA: Diagnosis not present

## 2016-04-20 DIAGNOSIS — E876 Hypokalemia: Secondary | ICD-10-CM | POA: Diagnosis not present

## 2016-04-20 DIAGNOSIS — N186 End stage renal disease: Secondary | ICD-10-CM | POA: Diagnosis not present

## 2016-04-20 DIAGNOSIS — D509 Iron deficiency anemia, unspecified: Secondary | ICD-10-CM | POA: Diagnosis not present

## 2016-04-20 DIAGNOSIS — I159 Secondary hypertension, unspecified: Secondary | ICD-10-CM | POA: Diagnosis not present

## 2016-04-23 DIAGNOSIS — I159 Secondary hypertension, unspecified: Secondary | ICD-10-CM | POA: Diagnosis not present

## 2016-04-23 DIAGNOSIS — D509 Iron deficiency anemia, unspecified: Secondary | ICD-10-CM | POA: Diagnosis not present

## 2016-04-23 DIAGNOSIS — D631 Anemia in chronic kidney disease: Secondary | ICD-10-CM | POA: Diagnosis not present

## 2016-04-23 DIAGNOSIS — N186 End stage renal disease: Secondary | ICD-10-CM | POA: Diagnosis not present

## 2016-04-23 DIAGNOSIS — E876 Hypokalemia: Secondary | ICD-10-CM | POA: Diagnosis not present

## 2016-04-25 DIAGNOSIS — I159 Secondary hypertension, unspecified: Secondary | ICD-10-CM | POA: Diagnosis not present

## 2016-04-25 DIAGNOSIS — E039 Hypothyroidism, unspecified: Secondary | ICD-10-CM | POA: Diagnosis not present

## 2016-04-25 DIAGNOSIS — Z992 Dependence on renal dialysis: Secondary | ICD-10-CM | POA: Diagnosis not present

## 2016-04-25 DIAGNOSIS — T83498A Other mechanical complication of other prosthetic devices, implants and grafts of genital tract, initial encounter: Secondary | ICD-10-CM | POA: Diagnosis not present

## 2016-04-25 DIAGNOSIS — T82590A Other mechanical complication of surgically created arteriovenous fistula, initial encounter: Secondary | ICD-10-CM | POA: Diagnosis not present

## 2016-04-25 DIAGNOSIS — E1165 Type 2 diabetes mellitus with hyperglycemia: Secondary | ICD-10-CM | POA: Diagnosis not present

## 2016-04-25 DIAGNOSIS — I10 Essential (primary) hypertension: Secondary | ICD-10-CM | POA: Diagnosis not present

## 2016-04-25 DIAGNOSIS — Z743 Need for continuous supervision: Secondary | ICD-10-CM | POA: Diagnosis not present

## 2016-04-25 DIAGNOSIS — R791 Abnormal coagulation profile: Secondary | ICD-10-CM | POA: Diagnosis present

## 2016-04-25 DIAGNOSIS — E785 Hyperlipidemia, unspecified: Secondary | ICD-10-CM | POA: Diagnosis not present

## 2016-04-25 DIAGNOSIS — E1121 Type 2 diabetes mellitus with diabetic nephropathy: Secondary | ICD-10-CM | POA: Diagnosis not present

## 2016-04-25 DIAGNOSIS — I120 Hypertensive chronic kidney disease with stage 5 chronic kidney disease or end stage renal disease: Secondary | ICD-10-CM | POA: Diagnosis not present

## 2016-04-25 DIAGNOSIS — Z8701 Personal history of pneumonia (recurrent): Secondary | ICD-10-CM | POA: Diagnosis not present

## 2016-04-25 DIAGNOSIS — E1122 Type 2 diabetes mellitus with diabetic chronic kidney disease: Secondary | ICD-10-CM | POA: Diagnosis present

## 2016-04-25 DIAGNOSIS — I871 Compression of vein: Secondary | ICD-10-CM | POA: Diagnosis present

## 2016-04-25 DIAGNOSIS — Z833 Family history of diabetes mellitus: Secondary | ICD-10-CM | POA: Diagnosis not present

## 2016-04-25 DIAGNOSIS — N186 End stage renal disease: Secondary | ICD-10-CM | POA: Diagnosis not present

## 2016-04-25 DIAGNOSIS — D631 Anemia in chronic kidney disease: Secondary | ICD-10-CM | POA: Diagnosis present

## 2016-04-25 DIAGNOSIS — E876 Hypokalemia: Secondary | ICD-10-CM | POA: Diagnosis not present

## 2016-04-25 DIAGNOSIS — T82858A Stenosis of vascular prosthetic devices, implants and grafts, initial encounter: Secondary | ICD-10-CM | POA: Diagnosis not present

## 2016-04-25 DIAGNOSIS — T82838A Hemorrhage of vascular prosthetic devices, implants and grafts, initial encounter: Secondary | ICD-10-CM | POA: Diagnosis not present

## 2016-04-25 DIAGNOSIS — D509 Iron deficiency anemia, unspecified: Secondary | ICD-10-CM | POA: Diagnosis not present

## 2016-04-30 DIAGNOSIS — N186 End stage renal disease: Secondary | ICD-10-CM | POA: Diagnosis not present

## 2016-04-30 DIAGNOSIS — D631 Anemia in chronic kidney disease: Secondary | ICD-10-CM | POA: Diagnosis not present

## 2016-04-30 DIAGNOSIS — D509 Iron deficiency anemia, unspecified: Secondary | ICD-10-CM | POA: Diagnosis not present

## 2016-04-30 DIAGNOSIS — I159 Secondary hypertension, unspecified: Secondary | ICD-10-CM | POA: Diagnosis not present

## 2016-04-30 DIAGNOSIS — E876 Hypokalemia: Secondary | ICD-10-CM | POA: Diagnosis not present

## 2016-05-02 DIAGNOSIS — E876 Hypokalemia: Secondary | ICD-10-CM | POA: Diagnosis not present

## 2016-05-02 DIAGNOSIS — D509 Iron deficiency anemia, unspecified: Secondary | ICD-10-CM | POA: Diagnosis not present

## 2016-05-02 DIAGNOSIS — N186 End stage renal disease: Secondary | ICD-10-CM | POA: Diagnosis not present

## 2016-05-02 DIAGNOSIS — I159 Secondary hypertension, unspecified: Secondary | ICD-10-CM | POA: Diagnosis not present

## 2016-05-02 DIAGNOSIS — D631 Anemia in chronic kidney disease: Secondary | ICD-10-CM | POA: Diagnosis not present

## 2016-05-04 DIAGNOSIS — N186 End stage renal disease: Secondary | ICD-10-CM | POA: Diagnosis not present

## 2016-05-04 DIAGNOSIS — E876 Hypokalemia: Secondary | ICD-10-CM | POA: Diagnosis not present

## 2016-05-04 DIAGNOSIS — I159 Secondary hypertension, unspecified: Secondary | ICD-10-CM | POA: Diagnosis not present

## 2016-05-04 DIAGNOSIS — D631 Anemia in chronic kidney disease: Secondary | ICD-10-CM | POA: Diagnosis not present

## 2016-05-04 DIAGNOSIS — D509 Iron deficiency anemia, unspecified: Secondary | ICD-10-CM | POA: Diagnosis not present

## 2016-05-07 DIAGNOSIS — E876 Hypokalemia: Secondary | ICD-10-CM | POA: Diagnosis not present

## 2016-05-07 DIAGNOSIS — D631 Anemia in chronic kidney disease: Secondary | ICD-10-CM | POA: Diagnosis not present

## 2016-05-07 DIAGNOSIS — N186 End stage renal disease: Secondary | ICD-10-CM | POA: Diagnosis not present

## 2016-05-07 DIAGNOSIS — D509 Iron deficiency anemia, unspecified: Secondary | ICD-10-CM | POA: Diagnosis not present

## 2016-05-07 DIAGNOSIS — I159 Secondary hypertension, unspecified: Secondary | ICD-10-CM | POA: Diagnosis not present

## 2016-05-09 DIAGNOSIS — N186 End stage renal disease: Secondary | ICD-10-CM | POA: Diagnosis not present

## 2016-05-09 DIAGNOSIS — D631 Anemia in chronic kidney disease: Secondary | ICD-10-CM | POA: Diagnosis not present

## 2016-05-09 DIAGNOSIS — I159 Secondary hypertension, unspecified: Secondary | ICD-10-CM | POA: Diagnosis not present

## 2016-05-09 DIAGNOSIS — E876 Hypokalemia: Secondary | ICD-10-CM | POA: Diagnosis not present

## 2016-05-09 DIAGNOSIS — D509 Iron deficiency anemia, unspecified: Secondary | ICD-10-CM | POA: Diagnosis not present

## 2016-05-11 DIAGNOSIS — N186 End stage renal disease: Secondary | ICD-10-CM | POA: Diagnosis not present

## 2016-05-11 DIAGNOSIS — D631 Anemia in chronic kidney disease: Secondary | ICD-10-CM | POA: Diagnosis not present

## 2016-05-11 DIAGNOSIS — E876 Hypokalemia: Secondary | ICD-10-CM | POA: Diagnosis not present

## 2016-05-11 DIAGNOSIS — I159 Secondary hypertension, unspecified: Secondary | ICD-10-CM | POA: Diagnosis not present

## 2016-05-11 DIAGNOSIS — D509 Iron deficiency anemia, unspecified: Secondary | ICD-10-CM | POA: Diagnosis not present

## 2016-05-14 DIAGNOSIS — I159 Secondary hypertension, unspecified: Secondary | ICD-10-CM | POA: Diagnosis not present

## 2016-05-14 DIAGNOSIS — D631 Anemia in chronic kidney disease: Secondary | ICD-10-CM | POA: Diagnosis not present

## 2016-05-14 DIAGNOSIS — D509 Iron deficiency anemia, unspecified: Secondary | ICD-10-CM | POA: Diagnosis not present

## 2016-05-14 DIAGNOSIS — N186 End stage renal disease: Secondary | ICD-10-CM | POA: Diagnosis not present

## 2016-05-14 DIAGNOSIS — E876 Hypokalemia: Secondary | ICD-10-CM | POA: Diagnosis not present

## 2016-05-15 ENCOUNTER — Other Ambulatory Visit: Payer: Self-pay | Admitting: Internal Medicine

## 2016-05-16 DIAGNOSIS — E876 Hypokalemia: Secondary | ICD-10-CM | POA: Diagnosis not present

## 2016-05-16 DIAGNOSIS — D631 Anemia in chronic kidney disease: Secondary | ICD-10-CM | POA: Diagnosis not present

## 2016-05-16 DIAGNOSIS — I159 Secondary hypertension, unspecified: Secondary | ICD-10-CM | POA: Diagnosis not present

## 2016-05-16 DIAGNOSIS — D509 Iron deficiency anemia, unspecified: Secondary | ICD-10-CM | POA: Diagnosis not present

## 2016-05-16 DIAGNOSIS — N186 End stage renal disease: Secondary | ICD-10-CM | POA: Diagnosis not present

## 2016-05-18 DIAGNOSIS — D509 Iron deficiency anemia, unspecified: Secondary | ICD-10-CM | POA: Diagnosis not present

## 2016-05-18 DIAGNOSIS — E876 Hypokalemia: Secondary | ICD-10-CM | POA: Diagnosis not present

## 2016-05-18 DIAGNOSIS — N186 End stage renal disease: Secondary | ICD-10-CM | POA: Diagnosis not present

## 2016-05-18 DIAGNOSIS — D631 Anemia in chronic kidney disease: Secondary | ICD-10-CM | POA: Diagnosis not present

## 2016-05-18 DIAGNOSIS — I159 Secondary hypertension, unspecified: Secondary | ICD-10-CM | POA: Diagnosis not present

## 2016-05-21 DIAGNOSIS — I159 Secondary hypertension, unspecified: Secondary | ICD-10-CM | POA: Diagnosis not present

## 2016-05-21 DIAGNOSIS — D509 Iron deficiency anemia, unspecified: Secondary | ICD-10-CM | POA: Diagnosis not present

## 2016-05-21 DIAGNOSIS — N186 End stage renal disease: Secondary | ICD-10-CM | POA: Diagnosis not present

## 2016-05-21 DIAGNOSIS — E876 Hypokalemia: Secondary | ICD-10-CM | POA: Diagnosis not present

## 2016-05-21 DIAGNOSIS — D631 Anemia in chronic kidney disease: Secondary | ICD-10-CM | POA: Diagnosis not present

## 2016-05-23 DIAGNOSIS — I159 Secondary hypertension, unspecified: Secondary | ICD-10-CM | POA: Diagnosis not present

## 2016-05-23 DIAGNOSIS — D631 Anemia in chronic kidney disease: Secondary | ICD-10-CM | POA: Diagnosis not present

## 2016-05-23 DIAGNOSIS — E876 Hypokalemia: Secondary | ICD-10-CM | POA: Diagnosis not present

## 2016-05-23 DIAGNOSIS — D509 Iron deficiency anemia, unspecified: Secondary | ICD-10-CM | POA: Diagnosis not present

## 2016-05-23 DIAGNOSIS — N186 End stage renal disease: Secondary | ICD-10-CM | POA: Diagnosis not present

## 2016-05-25 DIAGNOSIS — I159 Secondary hypertension, unspecified: Secondary | ICD-10-CM | POA: Diagnosis not present

## 2016-05-25 DIAGNOSIS — N186 End stage renal disease: Secondary | ICD-10-CM | POA: Diagnosis not present

## 2016-05-25 DIAGNOSIS — D631 Anemia in chronic kidney disease: Secondary | ICD-10-CM | POA: Diagnosis not present

## 2016-05-25 DIAGNOSIS — D509 Iron deficiency anemia, unspecified: Secondary | ICD-10-CM | POA: Diagnosis not present

## 2016-05-25 DIAGNOSIS — E876 Hypokalemia: Secondary | ICD-10-CM | POA: Diagnosis not present

## 2016-05-28 DIAGNOSIS — D509 Iron deficiency anemia, unspecified: Secondary | ICD-10-CM | POA: Diagnosis not present

## 2016-05-28 DIAGNOSIS — N186 End stage renal disease: Secondary | ICD-10-CM | POA: Diagnosis not present

## 2016-05-28 DIAGNOSIS — E876 Hypokalemia: Secondary | ICD-10-CM | POA: Diagnosis not present

## 2016-05-28 DIAGNOSIS — I159 Secondary hypertension, unspecified: Secondary | ICD-10-CM | POA: Diagnosis not present

## 2016-05-28 DIAGNOSIS — D631 Anemia in chronic kidney disease: Secondary | ICD-10-CM | POA: Diagnosis not present

## 2016-05-30 DIAGNOSIS — D631 Anemia in chronic kidney disease: Secondary | ICD-10-CM | POA: Diagnosis not present

## 2016-05-30 DIAGNOSIS — N186 End stage renal disease: Secondary | ICD-10-CM | POA: Diagnosis not present

## 2016-05-30 DIAGNOSIS — I159 Secondary hypertension, unspecified: Secondary | ICD-10-CM | POA: Diagnosis not present

## 2016-05-30 DIAGNOSIS — D509 Iron deficiency anemia, unspecified: Secondary | ICD-10-CM | POA: Diagnosis not present

## 2016-05-30 DIAGNOSIS — E876 Hypokalemia: Secondary | ICD-10-CM | POA: Diagnosis not present

## 2016-06-01 DIAGNOSIS — I159 Secondary hypertension, unspecified: Secondary | ICD-10-CM | POA: Diagnosis not present

## 2016-06-01 DIAGNOSIS — E876 Hypokalemia: Secondary | ICD-10-CM | POA: Diagnosis not present

## 2016-06-01 DIAGNOSIS — N186 End stage renal disease: Secondary | ICD-10-CM | POA: Diagnosis not present

## 2016-06-01 DIAGNOSIS — D509 Iron deficiency anemia, unspecified: Secondary | ICD-10-CM | POA: Diagnosis not present

## 2016-06-01 DIAGNOSIS — D631 Anemia in chronic kidney disease: Secondary | ICD-10-CM | POA: Diagnosis not present

## 2016-06-04 DIAGNOSIS — I159 Secondary hypertension, unspecified: Secondary | ICD-10-CM | POA: Diagnosis not present

## 2016-06-04 DIAGNOSIS — D509 Iron deficiency anemia, unspecified: Secondary | ICD-10-CM | POA: Diagnosis not present

## 2016-06-04 DIAGNOSIS — D631 Anemia in chronic kidney disease: Secondary | ICD-10-CM | POA: Diagnosis not present

## 2016-06-04 DIAGNOSIS — N186 End stage renal disease: Secondary | ICD-10-CM | POA: Diagnosis not present

## 2016-06-04 DIAGNOSIS — E876 Hypokalemia: Secondary | ICD-10-CM | POA: Diagnosis not present

## 2016-06-06 DIAGNOSIS — D631 Anemia in chronic kidney disease: Secondary | ICD-10-CM | POA: Diagnosis not present

## 2016-06-06 DIAGNOSIS — N186 End stage renal disease: Secondary | ICD-10-CM | POA: Diagnosis not present

## 2016-06-06 DIAGNOSIS — I159 Secondary hypertension, unspecified: Secondary | ICD-10-CM | POA: Diagnosis not present

## 2016-06-06 DIAGNOSIS — E876 Hypokalemia: Secondary | ICD-10-CM | POA: Diagnosis not present

## 2016-06-06 DIAGNOSIS — D509 Iron deficiency anemia, unspecified: Secondary | ICD-10-CM | POA: Diagnosis not present

## 2016-06-08 DIAGNOSIS — D631 Anemia in chronic kidney disease: Secondary | ICD-10-CM | POA: Diagnosis not present

## 2016-06-08 DIAGNOSIS — D509 Iron deficiency anemia, unspecified: Secondary | ICD-10-CM | POA: Diagnosis not present

## 2016-06-08 DIAGNOSIS — N186 End stage renal disease: Secondary | ICD-10-CM | POA: Diagnosis not present

## 2016-06-08 DIAGNOSIS — E876 Hypokalemia: Secondary | ICD-10-CM | POA: Diagnosis not present

## 2016-06-08 DIAGNOSIS — I159 Secondary hypertension, unspecified: Secondary | ICD-10-CM | POA: Diagnosis not present

## 2016-06-11 DIAGNOSIS — E876 Hypokalemia: Secondary | ICD-10-CM | POA: Diagnosis not present

## 2016-06-11 DIAGNOSIS — D631 Anemia in chronic kidney disease: Secondary | ICD-10-CM | POA: Diagnosis not present

## 2016-06-11 DIAGNOSIS — D509 Iron deficiency anemia, unspecified: Secondary | ICD-10-CM | POA: Diagnosis not present

## 2016-06-11 DIAGNOSIS — N186 End stage renal disease: Secondary | ICD-10-CM | POA: Diagnosis not present

## 2016-06-11 DIAGNOSIS — I159 Secondary hypertension, unspecified: Secondary | ICD-10-CM | POA: Diagnosis not present

## 2016-06-13 DIAGNOSIS — N186 End stage renal disease: Secondary | ICD-10-CM | POA: Diagnosis not present

## 2016-06-13 DIAGNOSIS — E876 Hypokalemia: Secondary | ICD-10-CM | POA: Diagnosis not present

## 2016-06-13 DIAGNOSIS — D509 Iron deficiency anemia, unspecified: Secondary | ICD-10-CM | POA: Diagnosis not present

## 2016-06-13 DIAGNOSIS — D631 Anemia in chronic kidney disease: Secondary | ICD-10-CM | POA: Diagnosis not present

## 2016-06-13 DIAGNOSIS — I159 Secondary hypertension, unspecified: Secondary | ICD-10-CM | POA: Diagnosis not present

## 2016-06-14 DIAGNOSIS — I120 Hypertensive chronic kidney disease with stage 5 chronic kidney disease or end stage renal disease: Secondary | ICD-10-CM | POA: Diagnosis not present

## 2016-06-14 DIAGNOSIS — Z992 Dependence on renal dialysis: Secondary | ICD-10-CM | POA: Diagnosis not present

## 2016-06-14 DIAGNOSIS — N186 End stage renal disease: Secondary | ICD-10-CM | POA: Diagnosis not present

## 2016-06-15 DIAGNOSIS — D509 Iron deficiency anemia, unspecified: Secondary | ICD-10-CM | POA: Diagnosis not present

## 2016-06-15 DIAGNOSIS — E876 Hypokalemia: Secondary | ICD-10-CM | POA: Diagnosis not present

## 2016-06-15 DIAGNOSIS — I159 Secondary hypertension, unspecified: Secondary | ICD-10-CM | POA: Diagnosis not present

## 2016-06-15 DIAGNOSIS — N186 End stage renal disease: Secondary | ICD-10-CM | POA: Diagnosis not present

## 2016-06-15 DIAGNOSIS — D631 Anemia in chronic kidney disease: Secondary | ICD-10-CM | POA: Diagnosis not present

## 2016-06-18 DIAGNOSIS — D631 Anemia in chronic kidney disease: Secondary | ICD-10-CM | POA: Diagnosis not present

## 2016-06-18 DIAGNOSIS — I159 Secondary hypertension, unspecified: Secondary | ICD-10-CM | POA: Diagnosis not present

## 2016-06-18 DIAGNOSIS — E876 Hypokalemia: Secondary | ICD-10-CM | POA: Diagnosis not present

## 2016-06-18 DIAGNOSIS — D509 Iron deficiency anemia, unspecified: Secondary | ICD-10-CM | POA: Diagnosis not present

## 2016-06-18 DIAGNOSIS — N186 End stage renal disease: Secondary | ICD-10-CM | POA: Diagnosis not present

## 2016-06-20 DIAGNOSIS — D631 Anemia in chronic kidney disease: Secondary | ICD-10-CM | POA: Diagnosis not present

## 2016-06-20 DIAGNOSIS — D509 Iron deficiency anemia, unspecified: Secondary | ICD-10-CM | POA: Diagnosis not present

## 2016-06-20 DIAGNOSIS — E876 Hypokalemia: Secondary | ICD-10-CM | POA: Diagnosis not present

## 2016-06-20 DIAGNOSIS — N186 End stage renal disease: Secondary | ICD-10-CM | POA: Diagnosis not present

## 2016-06-20 DIAGNOSIS — I159 Secondary hypertension, unspecified: Secondary | ICD-10-CM | POA: Diagnosis not present

## 2016-06-22 DIAGNOSIS — D631 Anemia in chronic kidney disease: Secondary | ICD-10-CM | POA: Diagnosis not present

## 2016-06-22 DIAGNOSIS — D509 Iron deficiency anemia, unspecified: Secondary | ICD-10-CM | POA: Diagnosis not present

## 2016-06-22 DIAGNOSIS — I159 Secondary hypertension, unspecified: Secondary | ICD-10-CM | POA: Diagnosis not present

## 2016-06-22 DIAGNOSIS — N186 End stage renal disease: Secondary | ICD-10-CM | POA: Diagnosis not present

## 2016-06-22 DIAGNOSIS — E876 Hypokalemia: Secondary | ICD-10-CM | POA: Diagnosis not present

## 2016-06-25 DIAGNOSIS — E876 Hypokalemia: Secondary | ICD-10-CM | POA: Diagnosis not present

## 2016-06-25 DIAGNOSIS — I159 Secondary hypertension, unspecified: Secondary | ICD-10-CM | POA: Diagnosis not present

## 2016-06-25 DIAGNOSIS — D509 Iron deficiency anemia, unspecified: Secondary | ICD-10-CM | POA: Diagnosis not present

## 2016-06-25 DIAGNOSIS — N186 End stage renal disease: Secondary | ICD-10-CM | POA: Diagnosis not present

## 2016-06-25 DIAGNOSIS — D631 Anemia in chronic kidney disease: Secondary | ICD-10-CM | POA: Diagnosis not present

## 2016-06-27 DIAGNOSIS — E876 Hypokalemia: Secondary | ICD-10-CM | POA: Diagnosis not present

## 2016-06-27 DIAGNOSIS — D509 Iron deficiency anemia, unspecified: Secondary | ICD-10-CM | POA: Diagnosis not present

## 2016-06-27 DIAGNOSIS — D631 Anemia in chronic kidney disease: Secondary | ICD-10-CM | POA: Diagnosis not present

## 2016-06-27 DIAGNOSIS — I159 Secondary hypertension, unspecified: Secondary | ICD-10-CM | POA: Diagnosis not present

## 2016-06-27 DIAGNOSIS — N186 End stage renal disease: Secondary | ICD-10-CM | POA: Diagnosis not present

## 2016-06-28 DIAGNOSIS — D631 Anemia in chronic kidney disease: Secondary | ICD-10-CM | POA: Diagnosis not present

## 2016-06-28 DIAGNOSIS — N186 End stage renal disease: Secondary | ICD-10-CM | POA: Diagnosis not present

## 2016-06-28 DIAGNOSIS — I159 Secondary hypertension, unspecified: Secondary | ICD-10-CM | POA: Diagnosis not present

## 2016-06-28 DIAGNOSIS — E876 Hypokalemia: Secondary | ICD-10-CM | POA: Diagnosis not present

## 2016-06-28 DIAGNOSIS — D509 Iron deficiency anemia, unspecified: Secondary | ICD-10-CM | POA: Diagnosis not present

## 2016-07-01 DIAGNOSIS — E1122 Type 2 diabetes mellitus with diabetic chronic kidney disease: Secondary | ICD-10-CM | POA: Diagnosis not present

## 2016-07-01 DIAGNOSIS — N2581 Secondary hyperparathyroidism of renal origin: Secondary | ICD-10-CM | POA: Diagnosis not present

## 2016-07-01 DIAGNOSIS — N186 End stage renal disease: Secondary | ICD-10-CM | POA: Diagnosis not present

## 2016-07-03 DIAGNOSIS — N186 End stage renal disease: Secondary | ICD-10-CM | POA: Diagnosis not present

## 2016-07-03 DIAGNOSIS — N2581 Secondary hyperparathyroidism of renal origin: Secondary | ICD-10-CM | POA: Diagnosis not present

## 2016-07-03 DIAGNOSIS — E1122 Type 2 diabetes mellitus with diabetic chronic kidney disease: Secondary | ICD-10-CM | POA: Diagnosis not present

## 2016-07-05 DIAGNOSIS — E1122 Type 2 diabetes mellitus with diabetic chronic kidney disease: Secondary | ICD-10-CM | POA: Diagnosis not present

## 2016-07-05 DIAGNOSIS — N2581 Secondary hyperparathyroidism of renal origin: Secondary | ICD-10-CM | POA: Diagnosis not present

## 2016-07-05 DIAGNOSIS — N186 End stage renal disease: Secondary | ICD-10-CM | POA: Diagnosis not present

## 2016-07-08 DIAGNOSIS — E1122 Type 2 diabetes mellitus with diabetic chronic kidney disease: Secondary | ICD-10-CM | POA: Diagnosis not present

## 2016-07-08 DIAGNOSIS — N186 End stage renal disease: Secondary | ICD-10-CM | POA: Diagnosis not present

## 2016-07-08 DIAGNOSIS — N2581 Secondary hyperparathyroidism of renal origin: Secondary | ICD-10-CM | POA: Diagnosis not present

## 2016-07-10 DIAGNOSIS — N186 End stage renal disease: Secondary | ICD-10-CM | POA: Diagnosis not present

## 2016-07-10 DIAGNOSIS — E1122 Type 2 diabetes mellitus with diabetic chronic kidney disease: Secondary | ICD-10-CM | POA: Diagnosis not present

## 2016-07-10 DIAGNOSIS — N2581 Secondary hyperparathyroidism of renal origin: Secondary | ICD-10-CM | POA: Diagnosis not present

## 2016-07-12 DIAGNOSIS — N186 End stage renal disease: Secondary | ICD-10-CM | POA: Diagnosis not present

## 2016-07-12 DIAGNOSIS — E1122 Type 2 diabetes mellitus with diabetic chronic kidney disease: Secondary | ICD-10-CM | POA: Diagnosis not present

## 2016-07-12 DIAGNOSIS — N2581 Secondary hyperparathyroidism of renal origin: Secondary | ICD-10-CM | POA: Diagnosis not present

## 2016-07-15 DIAGNOSIS — N2581 Secondary hyperparathyroidism of renal origin: Secondary | ICD-10-CM | POA: Diagnosis not present

## 2016-07-15 DIAGNOSIS — N186 End stage renal disease: Secondary | ICD-10-CM | POA: Diagnosis not present

## 2016-07-15 DIAGNOSIS — Z992 Dependence on renal dialysis: Secondary | ICD-10-CM | POA: Diagnosis not present

## 2016-07-15 DIAGNOSIS — E1122 Type 2 diabetes mellitus with diabetic chronic kidney disease: Secondary | ICD-10-CM | POA: Diagnosis not present

## 2016-07-15 DIAGNOSIS — E1129 Type 2 diabetes mellitus with other diabetic kidney complication: Secondary | ICD-10-CM | POA: Diagnosis not present

## 2016-07-17 DIAGNOSIS — E1122 Type 2 diabetes mellitus with diabetic chronic kidney disease: Secondary | ICD-10-CM | POA: Diagnosis not present

## 2016-07-17 DIAGNOSIS — D631 Anemia in chronic kidney disease: Secondary | ICD-10-CM | POA: Diagnosis not present

## 2016-07-17 DIAGNOSIS — N186 End stage renal disease: Secondary | ICD-10-CM | POA: Diagnosis not present

## 2016-07-17 DIAGNOSIS — N2581 Secondary hyperparathyroidism of renal origin: Secondary | ICD-10-CM | POA: Diagnosis not present

## 2016-07-19 DIAGNOSIS — E1122 Type 2 diabetes mellitus with diabetic chronic kidney disease: Secondary | ICD-10-CM | POA: Diagnosis not present

## 2016-07-19 DIAGNOSIS — N186 End stage renal disease: Secondary | ICD-10-CM | POA: Diagnosis not present

## 2016-07-19 DIAGNOSIS — N2581 Secondary hyperparathyroidism of renal origin: Secondary | ICD-10-CM | POA: Diagnosis not present

## 2016-07-19 DIAGNOSIS — D631 Anemia in chronic kidney disease: Secondary | ICD-10-CM | POA: Diagnosis not present

## 2016-07-22 DIAGNOSIS — N186 End stage renal disease: Secondary | ICD-10-CM | POA: Diagnosis not present

## 2016-07-22 DIAGNOSIS — E1122 Type 2 diabetes mellitus with diabetic chronic kidney disease: Secondary | ICD-10-CM | POA: Diagnosis not present

## 2016-07-22 DIAGNOSIS — D631 Anemia in chronic kidney disease: Secondary | ICD-10-CM | POA: Diagnosis not present

## 2016-07-22 DIAGNOSIS — N2581 Secondary hyperparathyroidism of renal origin: Secondary | ICD-10-CM | POA: Diagnosis not present

## 2016-07-24 DIAGNOSIS — D631 Anemia in chronic kidney disease: Secondary | ICD-10-CM | POA: Diagnosis not present

## 2016-07-24 DIAGNOSIS — N2581 Secondary hyperparathyroidism of renal origin: Secondary | ICD-10-CM | POA: Diagnosis not present

## 2016-07-24 DIAGNOSIS — E1122 Type 2 diabetes mellitus with diabetic chronic kidney disease: Secondary | ICD-10-CM | POA: Diagnosis not present

## 2016-07-24 DIAGNOSIS — N186 End stage renal disease: Secondary | ICD-10-CM | POA: Diagnosis not present

## 2016-07-26 DIAGNOSIS — D631 Anemia in chronic kidney disease: Secondary | ICD-10-CM | POA: Diagnosis not present

## 2016-07-26 DIAGNOSIS — N2581 Secondary hyperparathyroidism of renal origin: Secondary | ICD-10-CM | POA: Diagnosis not present

## 2016-07-26 DIAGNOSIS — N186 End stage renal disease: Secondary | ICD-10-CM | POA: Diagnosis not present

## 2016-07-26 DIAGNOSIS — E1122 Type 2 diabetes mellitus with diabetic chronic kidney disease: Secondary | ICD-10-CM | POA: Diagnosis not present

## 2016-07-29 DIAGNOSIS — N2581 Secondary hyperparathyroidism of renal origin: Secondary | ICD-10-CM | POA: Diagnosis not present

## 2016-07-29 DIAGNOSIS — N186 End stage renal disease: Secondary | ICD-10-CM | POA: Diagnosis not present

## 2016-07-29 DIAGNOSIS — E1122 Type 2 diabetes mellitus with diabetic chronic kidney disease: Secondary | ICD-10-CM | POA: Diagnosis not present

## 2016-07-29 DIAGNOSIS — D631 Anemia in chronic kidney disease: Secondary | ICD-10-CM | POA: Diagnosis not present

## 2016-07-31 ENCOUNTER — Emergency Department (HOSPITAL_COMMUNITY)
Admission: EM | Admit: 2016-07-31 | Discharge: 2016-07-31 | Disposition: A | Discharge status: Home / Self Care | Payer: Medicare Other | Attending: Emergency Medicine | Admitting: Emergency Medicine

## 2016-07-31 ENCOUNTER — Encounter (HOSPITAL_COMMUNITY): Payer: Self-pay

## 2016-07-31 DIAGNOSIS — Y829 Unspecified medical devices associated with adverse incidents: Secondary | ICD-10-CM | POA: Insufficient documentation

## 2016-07-31 DIAGNOSIS — E039 Hypothyroidism, unspecified: Secondary | ICD-10-CM | POA: Insufficient documentation

## 2016-07-31 DIAGNOSIS — T82898A Other specified complication of vascular prosthetic devices, implants and grafts, initial encounter: Secondary | ICD-10-CM | POA: Diagnosis not present

## 2016-07-31 DIAGNOSIS — Z79899 Other long term (current) drug therapy: Secondary | ICD-10-CM | POA: Insufficient documentation

## 2016-07-31 DIAGNOSIS — I132 Hypertensive heart and chronic kidney disease with heart failure and with stage 5 chronic kidney disease, or end stage renal disease: Secondary | ICD-10-CM | POA: Insufficient documentation

## 2016-07-31 DIAGNOSIS — T859XXA Unspecified complication of internal prosthetic device, implant and graft, initial encounter: Secondary | ICD-10-CM | POA: Diagnosis not present

## 2016-07-31 DIAGNOSIS — N2581 Secondary hyperparathyroidism of renal origin: Secondary | ICD-10-CM | POA: Diagnosis not present

## 2016-07-31 DIAGNOSIS — I5042 Chronic combined systolic (congestive) and diastolic (congestive) heart failure: Secondary | ICD-10-CM | POA: Diagnosis not present

## 2016-07-31 DIAGNOSIS — T82838A Hemorrhage of vascular prosthetic devices, implants and grafts, initial encounter: Secondary | ICD-10-CM | POA: Diagnosis not present

## 2016-07-31 DIAGNOSIS — Z853 Personal history of malignant neoplasm of breast: Secondary | ICD-10-CM | POA: Diagnosis not present

## 2016-07-31 DIAGNOSIS — Z7982 Long term (current) use of aspirin: Secondary | ICD-10-CM | POA: Diagnosis not present

## 2016-07-31 DIAGNOSIS — E1122 Type 2 diabetes mellitus with diabetic chronic kidney disease: Secondary | ICD-10-CM | POA: Insufficient documentation

## 2016-07-31 DIAGNOSIS — N186 End stage renal disease: Secondary | ICD-10-CM | POA: Diagnosis not present

## 2016-07-31 DIAGNOSIS — D631 Anemia in chronic kidney disease: Secondary | ICD-10-CM | POA: Diagnosis not present

## 2016-07-31 DIAGNOSIS — Z992 Dependence on renal dialysis: Secondary | ICD-10-CM | POA: Insufficient documentation

## 2016-07-31 LAB — CBC WITH DIFFERENTIAL/PLATELET
BASOS ABS: 0 10*3/uL (ref 0.0–0.1)
BASOS PCT: 0 %
EOS ABS: 0.4 10*3/uL (ref 0.0–0.7)
Eosinophils Relative: 6 %
HCT: 31.1 % — ABNORMAL LOW (ref 36.0–46.0)
HEMOGLOBIN: 9.7 g/dL — AB (ref 12.0–15.0)
Lymphocytes Relative: 10 %
Lymphs Abs: 0.7 10*3/uL (ref 0.7–4.0)
MCH: 29.3 pg (ref 26.0–34.0)
MCHC: 31.2 g/dL (ref 30.0–36.0)
MCV: 94 fL (ref 78.0–100.0)
MONOS PCT: 11 %
Monocytes Absolute: 0.8 10*3/uL (ref 0.1–1.0)
NEUTROS PCT: 73 %
Neutro Abs: 5 10*3/uL (ref 1.7–7.7)
Platelets: 297 10*3/uL (ref 150–400)
RBC: 3.31 MIL/uL — AB (ref 3.87–5.11)
RDW: 15.3 % (ref 11.5–15.5)
WBC: 6.9 10*3/uL (ref 4.0–10.5)

## 2016-07-31 LAB — BASIC METABOLIC PANEL
Anion gap: 9 (ref 5–15)
BUN: 23 mg/dL — ABNORMAL HIGH (ref 6–20)
CALCIUM: 8.4 mg/dL — AB (ref 8.9–10.3)
CO2: 29 mmol/L (ref 22–32)
CREATININE: 6.24 mg/dL — AB (ref 0.44–1.00)
Chloride: 101 mmol/L (ref 101–111)
GFR, EST AFRICAN AMERICAN: 6 mL/min — AB (ref >60)
GFR, EST NON AFRICAN AMERICAN: 5 mL/min — AB (ref >60)
Glucose, Bld: 243 mg/dL — ABNORMAL HIGH (ref 65–99)
Potassium: 4 mmol/L (ref 3.5–5.1)
SODIUM: 139 mmol/L (ref 135–145)

## 2016-07-31 LAB — PROTIME-INR
INR: 0.99
PROTHROMBIN TIME: 13.1 s (ref 11.4–15.2)

## 2016-07-31 NOTE — ED Notes (Signed)
Hemostatic gauze given to EDP for dressing wrap.

## 2016-07-31 NOTE — ED Provider Notes (Signed)
[facility] DEPT Provider Note   CSN: [id number] Arrival date & time: 07/31/16  1454     History   Chief Complaint Chief Complaint  Patient presents with  . Vascular Access Problem    HPI [name] is a 83 y.o. female.  HPI Patient one to start dialysis today and when they placed the needles she developed bleeding and was unable to undergo dialysis due to ongoing bleeding around access sites. They applied pressure she was referred to the emergency department. Patient reports that she has not had any other problems. She's felt well. She denies she's had significant bleeding problems in the past. She has had clot of her fistula in the past and had to have that treated. She denies being on anticoagulants. No shortness of breath and no significant peripheral swelling different from baseline. Past Medical History:  Diagnosis Date  . Arthritis   . Atrial fibrillation ([place]) dx 06/2012   on coumadin  . Atrial fibrillation ([place]) 06/25/2012   2D Echo - EF 45-50, left atrium severely dilated, mitral valve heavily calcified annulus with calcification of the subvalvular apparatus  . Breast cancer ([place])   . CHF (congestive heart failure) ([place])   . DM2 (diabetes mellitus, type 2) ([place])   . ESRD (end stage renal disease) ([place])    HD MWF - [facility]  . HTN (hypertension)   . Hyperlipidemia   . Hyperparathyroidism, secondary renal ([place])   . Hypothyroid   . Shortness of breath   . Tibia/fibula fracture 07/10/2011   right - nonunion repair 06/2012    Patient Active Problem List   Diagnosis Date Noted  . Hypothyroidism 08/30/2015  . Labile blood glucose   . Memory loss   . Hip fracture ([place]) 07/12/2015  . Swelling   . ESRD on dialysis ([place])   . PAF (paroxysmal atrial fibrillation) ([place])   . Long term (current) use of anticoagulants 07/30/2012  . Chronic combined systolic and diastolic heart failure EF 45-50% by echo 06/25/2012 07/30/2012  . Breast cancer, s/p Lt mastectomy  07/22/2012  . Chronic anticoagulation 07/22/2012  . Pleural effusion due to CHF (congestive heart failure) ([place]) 07/20/2012  . Atrial fibrillation, spont convers NSR 06/26/12 06/25/2012  . Hypotension, unspecified 06/25/2012  . Right tibial fracture 06/21/2012  . Metabolic bone disease 09/32/6712  . Secondary hyperparathyroidism of renal origin ([place]) 06/19/2012  . Nonunion of fracture, R tibia 06/17/2012  . H/O radioactive iodine thyroid ablation   . Closed fracture of right fibula and tibia 07/10/2011  . HTN (hypertension) 07/10/2011  . DM2 (diabetes mellitus, type 2) ([place]) 07/10/2011  . Hyperlipidemia 07/10/2011  . Anemia 07/10/2011    Past Surgical History:  Procedure Laterality Date  . ABDOMINAL SURGERY    . ANKLE FUSION Right   . ARTERIOVENOUS GRAFT PLACEMENT Right   . BREAST SURGERY Left    Mastectomy  . EYE SURGERY Bilateral    Cataract  . MASTECTOMY    . ORIF TIBIA FRACTURE Right 06/16/2012   Procedure: OPEN REDUCTION INTERNAL FIXATION (ORIF) TIBIA FRACTURE;  Surgeon: Rozanna Box, MD;  Location: [facility];  Service: Orthopedics;  Laterality: Right;  . THYROID SURGERY      OB History    No data available       Home Medications    Prior to Admission medications   Medication Sig Start Date End Date Taking? Authorizing Provider  aspirin 325 MG tablet Take 1 tablet (325 mg total) by mouth daily. 07/28/15   Lauraine Rinne  J, PA-C  b complex-vitamin c-folic acid (NEPHRO-VITE) 0.8 MG TABS tablet Take 1 tablet by mouth at bedtime. 08/29/15   Binnie Rail, MD  darbepoetin (ARANESP) 100 MCG/0.5ML SOLN Inject 0.5 mLs (100 mcg total) into the vein every Wednesday with hemodialysis. 07/25/12   Erlene Quan, PA-C  diclofenac sodium (VOLTAREN) 1 % GEL Apply 2 g topically 3 (three) times daily. 07/28/15   Angiulli, Lavon Paganini, PA-C  glimepiride (AMARYL) 1 MG tablet TAKE 3 TABLETS(3 MG) BY MOUTH DAILY WITH BREAKFAST 04/01/16   Burns, Claudina Lick, MD  hydrALAZINE (APRESOLINE) 50 MG tablet  TAKE 1 TABLET(50 MG) BY MOUTH TWICE DAILY 01/15/16   Burns, Claudina Lick, MD  isosorbide mononitrate (IMDUR) 30 MG 24 hr tablet Take 1 tablet (30 mg total) by mouth daily. 07/28/15   Angiulli, Lavon Paganini, PA-C  levothyroxine (SYNTHROID, LEVOTHROID) 75 MCG tablet Take 1 tablet (75 mcg total) by mouth daily before breakfast. --- Office visit needed for further refills 05/15/16   Binnie Rail, MD  nebivolol (BYSTOLIC) 10 MG tablet Take 1 tablet (10 mg total) by mouth daily. 08/29/15   Binnie Rail, MD  pravastatin (PRAVACHOL) 20 MG tablet Take 1 tablet (20 mg total) by mouth daily at 6 PM. 08/29/15   Burns, Claudina Lick, MD  sevelamer carbonate (RENVELA) 800 MG tablet Take 1 tablet (800 mg total) by mouth 3 (three) times daily with meals. 08/29/15   Binnie Rail, MD  Vitamin D, Ergocalciferol, (DRISDOL) 50000 UNITS CAPS Take 50,000 Units by mouth every 7 (seven) days. Takes on Fridays.    [provider]  vitamin E 400 UNIT capsule Take 400 Units by mouth daily.    [provider]    Family History Family History  Problem Relation Age of Onset  . Hyperlipidemia Father   . Diabetes Father   . Breast cancer Sister     Social History Social History  Substance Use Topics  . Smoking status: Never Smoker  . Smokeless tobacco: Current User    Types: Snuff  . Alcohol use No     Allergies   Codeine; Coreg [carvedilol]; Lopressor [metoprolol tartrate]; and Metoprolol   Review of Systems Review of Systems  10 Systems reviewed and are negative for acute change except as noted in the HPI.  Physical Exam Updated Vital Signs BP (!) 161/41   Pulse (!) 55   Temp 97.9 F (36.6 C) (Oral)   Resp 17   Ht 5\' 1"  (1.549 m)   Wt 136 lb (61.7 kg)   SpO2 99%   BMI 25.70 kg/m   Physical Exam  Constitutional: She is oriented to person, place, and time. She appears well-developed and well-nourished. No distress.  HENT:  Mouth/Throat: Oropharynx is clear and moist.  Eyes: EOM are normal.    Cardiovascular: Normal rate, regular rhythm, normal heart sounds and intact distal pulses.   Pulmonary/Chest: Effort normal and breath sounds normal.  No basilar rales.  Abdominal: Soft. She exhibits no distension. There is no tenderness.  Musculoskeletal:  A right upper extremity has a bulky gauze with Coban wrapped around it. There is blood on the dressing. There is no active dripping or bleeding around the dressing. Palpable thrill at the patient's fistula distal to the dressing. Once dressing removed, there is no active bleeding from the puncture site.  Trace edema at the left lower leg. Right lower extremity has a chronic wound that is dressed with a 2 x 2 dressing. Chronic venous stasis changes  with hyperpigmentation and moderate swelling relative to left. Patient reports this is chronic and is in good baseline condition.  Neurological: She is alert and oriented to person, place, and time. No cranial nerve deficit.  Skin: Skin is warm and dry.     ED Treatments / Results  Labs (all labs ordered are listed, but only abnormal results are displayed) Labs Reviewed  BASIC METABOLIC PANEL - Abnormal; Notable for the following:       Result Value   Glucose, Bld 243 (*)    BUN 23 (*)    Creatinine, Ser 6.24 (*)    Calcium 8.4 (*)    GFR calc non Af Amer 5 (*)    GFR calc Af Amer 6 (*)    All other components within normal limits  CBC WITH DIFFERENTIAL/PLATELET - Abnormal; Notable for the following:    RBC 3.31 (*)    Hemoglobin 9.7 (*)    HCT 31.1 (*)    All other components within normal limits  PROTIME-INR    EKG  EKG Interpretation None       Radiology No results found.  Procedures Procedures (including critical care time) Wound dressing: Quit clot gauze applied to the puncture site which does not have active bleeding at this time. Bulky 4 x 4 dressing applied and Kerlix wrap. One layer wrap of outer Coban with minimal pressure applied. Good palpable thrill distal to  the dressing after application. Medications Ordered in ED Medications - No data to display   Initial Impression / Assessment and Plan / ED Course  I have reviewed the triage vital signs and the nursing notes.  Pertinent labs & imaging results that were available during my care of the patient were reviewed by me and considered in my medical decision making (see chart for details).      Final Clinical Impressions(s) / ED Diagnoses   Final diagnoses:  Bleeding from dialysis shunt, initial encounter [facility])   Patient bleeding from access site. Bulky compression was applied immediately upon arrival by nursing staff. After approximately 30 minutes dressing was removed and there was no active bleeding. I was prepared with dressing with quit clot Kerlix and Coban. These were applied. Patient shows no signs of vascular fluid overload to suggest she needs immediate dialysis. Potassium is stable. Patient is counseled to leave dressing in place and remove Coban within 2 hours if no bleeding. She may then leave the gauze and Kerlix in place until her dialysis on Friday. New Prescriptions New Prescriptions   No medications on file     Charlesetta Shanks, MD 07/31/16 [phone]

## 2016-07-31 NOTE — Discharge Instructions (Signed)
If there is no blood showing on your dressing in 2 hours, remove the  Coban (outer brown sticky part of the dressing). Leave the gauze dressing in place until you go to dialysis on Friday. Continue to monitor your fistula for a "thrill" as shown in the emergency department. If you do not feel that blood flow return for recheck.

## 2016-07-31 NOTE — ED Triage Notes (Signed)
Pt brought in by EMS from HD center due to fistula bleeding. Per EMS pt was 20 minutes in to HD treatment when fistula started to bleed around needles. Per Ems approximately 50-100cc of blood was lost. Pt a&ox4.

## 2016-07-31 NOTE — ED Notes (Signed)
This nurse assisted EDP with dressing change.  Applied hemostatic dressing, 5 4x4s, curlex, and coband.

## 2016-07-31 NOTE — ED Notes (Signed)
Fistula tightly wrapped by Loree Fee RN and EMS.  Unable to observe area.  No bleed through.

## 2016-08-02 DIAGNOSIS — N2581 Secondary hyperparathyroidism of renal origin: Secondary | ICD-10-CM | POA: Diagnosis not present

## 2016-08-02 DIAGNOSIS — N186 End stage renal disease: Secondary | ICD-10-CM | POA: Diagnosis not present

## 2016-08-02 DIAGNOSIS — D631 Anemia in chronic kidney disease: Secondary | ICD-10-CM | POA: Diagnosis not present

## 2016-08-02 DIAGNOSIS — E1122 Type 2 diabetes mellitus with diabetic chronic kidney disease: Secondary | ICD-10-CM | POA: Diagnosis not present

## 2016-08-05 DIAGNOSIS — N2581 Secondary hyperparathyroidism of renal origin: Secondary | ICD-10-CM | POA: Diagnosis not present

## 2016-08-05 DIAGNOSIS — N186 End stage renal disease: Secondary | ICD-10-CM | POA: Diagnosis not present

## 2016-08-05 DIAGNOSIS — E1122 Type 2 diabetes mellitus with diabetic chronic kidney disease: Secondary | ICD-10-CM | POA: Diagnosis not present

## 2016-08-05 DIAGNOSIS — D631 Anemia in chronic kidney disease: Secondary | ICD-10-CM | POA: Diagnosis not present

## 2016-08-07 DIAGNOSIS — N186 End stage renal disease: Secondary | ICD-10-CM | POA: Diagnosis not present

## 2016-08-07 DIAGNOSIS — E1122 Type 2 diabetes mellitus with diabetic chronic kidney disease: Secondary | ICD-10-CM | POA: Diagnosis not present

## 2016-08-07 DIAGNOSIS — N2581 Secondary hyperparathyroidism of renal origin: Secondary | ICD-10-CM | POA: Diagnosis not present

## 2016-08-07 DIAGNOSIS — D631 Anemia in chronic kidney disease: Secondary | ICD-10-CM | POA: Diagnosis not present

## 2016-08-09 DIAGNOSIS — E1122 Type 2 diabetes mellitus with diabetic chronic kidney disease: Secondary | ICD-10-CM | POA: Diagnosis not present

## 2016-08-09 DIAGNOSIS — D631 Anemia in chronic kidney disease: Secondary | ICD-10-CM | POA: Diagnosis not present

## 2016-08-09 DIAGNOSIS — N186 End stage renal disease: Secondary | ICD-10-CM | POA: Diagnosis not present

## 2016-08-09 DIAGNOSIS — N2581 Secondary hyperparathyroidism of renal origin: Secondary | ICD-10-CM | POA: Diagnosis not present

## 2016-08-12 DIAGNOSIS — N2581 Secondary hyperparathyroidism of renal origin: Secondary | ICD-10-CM | POA: Diagnosis not present

## 2016-08-12 DIAGNOSIS — N186 End stage renal disease: Secondary | ICD-10-CM | POA: Diagnosis not present

## 2016-08-12 DIAGNOSIS — D631 Anemia in chronic kidney disease: Secondary | ICD-10-CM | POA: Diagnosis not present

## 2016-08-12 DIAGNOSIS — E1122 Type 2 diabetes mellitus with diabetic chronic kidney disease: Secondary | ICD-10-CM | POA: Diagnosis not present

## 2016-08-13 ENCOUNTER — Encounter (HOSPITAL_COMMUNITY): Payer: Self-pay | Admitting: *Deleted

## 2016-08-13 ENCOUNTER — Ambulatory Visit (INDEPENDENT_AMBULATORY_CARE_PROVIDER_SITE_OTHER): Payer: Medicare Other

## 2016-08-13 ENCOUNTER — Inpatient Hospital Stay (HOSPITAL_COMMUNITY)
Admission: EM | Admit: 2016-08-13 | Discharge: 2016-08-17 | DRG: 480 | Disposition: A | Discharge status: Skilled Nursing Facility | Payer: Medicare Other | Attending: Family Medicine | Admitting: Family Medicine

## 2016-08-13 ENCOUNTER — Encounter (HOSPITAL_COMMUNITY): Payer: Self-pay

## 2016-08-13 ENCOUNTER — Ambulatory Visit (INDEPENDENT_AMBULATORY_CARE_PROVIDER_SITE_OTHER)
Admission: EM | Admit: 2016-08-13 | Discharge: 2016-08-13 | Disposition: A | Discharge status: Home / Self Care | Payer: Medicare Other | Source: Home / Self Care

## 2016-08-13 DIAGNOSIS — X58XXXA Exposure to other specified factors, initial encounter: Secondary | ICD-10-CM | POA: Diagnosis present

## 2016-08-13 DIAGNOSIS — R531 Weakness: Secondary | ICD-10-CM | POA: Diagnosis not present

## 2016-08-13 DIAGNOSIS — S72324A Nondisplaced transverse fracture of shaft of right femur, initial encounter for closed fracture: Secondary | ICD-10-CM

## 2016-08-13 DIAGNOSIS — S80251A Superficial foreign body, right knee, initial encounter: Secondary | ICD-10-CM | POA: Diagnosis not present

## 2016-08-13 DIAGNOSIS — S8290XA Unspecified fracture of unspecified lower leg, initial encounter for closed fracture: Secondary | ICD-10-CM | POA: Diagnosis present

## 2016-08-13 DIAGNOSIS — S82191D Other fracture of upper end of right tibia, subsequent encounter for closed fracture with routine healing: Secondary | ICD-10-CM | POA: Diagnosis not present

## 2016-08-13 DIAGNOSIS — I132 Hypertensive heart and chronic kidney disease with heart failure and with stage 5 chronic kidney disease, or end stage renal disease: Secondary | ICD-10-CM | POA: Diagnosis present

## 2016-08-13 DIAGNOSIS — E119 Type 2 diabetes mellitus without complications: Secondary | ICD-10-CM

## 2016-08-13 DIAGNOSIS — W109XXA Fall (on) (from) unspecified stairs and steps, initial encounter: Secondary | ICD-10-CM | POA: Diagnosis present

## 2016-08-13 DIAGNOSIS — S72451A Displaced supracondylar fracture without intracondylar extension of lower end of right femur, initial encounter for closed fracture: Secondary | ICD-10-CM | POA: Diagnosis present

## 2016-08-13 DIAGNOSIS — Z992 Dependence on renal dialysis: Secondary | ICD-10-CM | POA: Diagnosis not present

## 2016-08-13 DIAGNOSIS — Z7982 Long term (current) use of aspirin: Secondary | ICD-10-CM

## 2016-08-13 DIAGNOSIS — N2581 Secondary hyperparathyroidism of renal origin: Secondary | ICD-10-CM | POA: Diagnosis present

## 2016-08-13 DIAGNOSIS — D649 Anemia, unspecified: Secondary | ICD-10-CM | POA: Diagnosis not present

## 2016-08-13 DIAGNOSIS — Z23 Encounter for immunization: Secondary | ICD-10-CM | POA: Diagnosis not present

## 2016-08-13 DIAGNOSIS — Z853 Personal history of malignant neoplasm of breast: Secondary | ICD-10-CM

## 2016-08-13 DIAGNOSIS — Z9010 Acquired absence of unspecified breast and nipple: Secondary | ICD-10-CM | POA: Diagnosis not present

## 2016-08-13 DIAGNOSIS — Z833 Family history of diabetes mellitus: Secondary | ICD-10-CM

## 2016-08-13 DIAGNOSIS — Z419 Encounter for procedure for purposes other than remedying health state, unspecified: Secondary | ICD-10-CM

## 2016-08-13 DIAGNOSIS — I120 Hypertensive chronic kidney disease with stage 5 chronic kidney disease or end stage renal disease: Secondary | ICD-10-CM | POA: Diagnosis not present

## 2016-08-13 DIAGNOSIS — Y92018 Other place in single-family (private) house as the place of occurrence of the external cause: Secondary | ICD-10-CM | POA: Diagnosis not present

## 2016-08-13 DIAGNOSIS — N184 Chronic kidney disease, stage 4 (severe): Secondary | ICD-10-CM | POA: Diagnosis not present

## 2016-08-13 DIAGNOSIS — M79604 Pain in right leg: Secondary | ICD-10-CM | POA: Diagnosis not present

## 2016-08-13 DIAGNOSIS — I5042 Chronic combined systolic (congestive) and diastolic (congestive) heart failure: Secondary | ICD-10-CM | POA: Diagnosis not present

## 2016-08-13 DIAGNOSIS — I480 Paroxysmal atrial fibrillation: Secondary | ICD-10-CM | POA: Diagnosis not present

## 2016-08-13 DIAGNOSIS — S79101A Unspecified physeal fracture of lower end of right femur, initial encounter for closed fracture: Secondary | ICD-10-CM

## 2016-08-13 DIAGNOSIS — M6281 Muscle weakness (generalized): Secondary | ICD-10-CM | POA: Diagnosis not present

## 2016-08-13 DIAGNOSIS — S728X9A Other fracture of unspecified femur, initial encounter for closed fracture: Secondary | ICD-10-CM | POA: Diagnosis not present

## 2016-08-13 DIAGNOSIS — S82191A Other fracture of upper end of right tibia, initial encounter for closed fracture: Secondary | ICD-10-CM

## 2016-08-13 DIAGNOSIS — S80851A Superficial foreign body, right lower leg, initial encounter: Secondary | ICD-10-CM | POA: Diagnosis present

## 2016-08-13 DIAGNOSIS — E8889 Other specified metabolic disorders: Secondary | ICD-10-CM | POA: Diagnosis present

## 2016-08-13 DIAGNOSIS — M25551 Pain in right hip: Secondary | ICD-10-CM | POA: Diagnosis not present

## 2016-08-13 DIAGNOSIS — S7291XD Unspecified fracture of right femur, subsequent encounter for closed fracture with routine healing: Secondary | ICD-10-CM | POA: Diagnosis not present

## 2016-08-13 DIAGNOSIS — E039 Hypothyroidism, unspecified: Secondary | ICD-10-CM | POA: Diagnosis present

## 2016-08-13 DIAGNOSIS — Z79899 Other long term (current) drug therapy: Secondary | ICD-10-CM

## 2016-08-13 DIAGNOSIS — E785 Hyperlipidemia, unspecified: Secondary | ICD-10-CM | POA: Diagnosis present

## 2016-08-13 DIAGNOSIS — S8291XA Unspecified fracture of right lower leg, initial encounter for closed fracture: Secondary | ICD-10-CM | POA: Diagnosis not present

## 2016-08-13 DIAGNOSIS — Z4789 Encounter for other orthopedic aftercare: Secondary | ICD-10-CM | POA: Diagnosis not present

## 2016-08-13 DIAGNOSIS — F17220 Nicotine dependence, chewing tobacco, uncomplicated: Secondary | ICD-10-CM | POA: Diagnosis present

## 2016-08-13 DIAGNOSIS — E1122 Type 2 diabetes mellitus with diabetic chronic kidney disease: Secondary | ICD-10-CM | POA: Diagnosis present

## 2016-08-13 DIAGNOSIS — S82301A Unspecified fracture of lower end of right tibia, initial encounter for closed fracture: Secondary | ICD-10-CM | POA: Diagnosis present

## 2016-08-13 DIAGNOSIS — S72401B Unspecified fracture of lower end of right femur, initial encounter for open fracture type I or II: Secondary | ICD-10-CM | POA: Diagnosis not present

## 2016-08-13 DIAGNOSIS — S7291XA Unspecified fracture of right femur, initial encounter for closed fracture: Secondary | ICD-10-CM

## 2016-08-13 DIAGNOSIS — S72001A Fracture of unspecified part of neck of right femur, initial encounter for closed fracture: Secondary | ICD-10-CM | POA: Diagnosis not present

## 2016-08-13 DIAGNOSIS — E1129 Type 2 diabetes mellitus with other diabetic kidney complication: Secondary | ICD-10-CM | POA: Diagnosis not present

## 2016-08-13 DIAGNOSIS — I4891 Unspecified atrial fibrillation: Secondary | ICD-10-CM | POA: Diagnosis present

## 2016-08-13 DIAGNOSIS — I10 Essential (primary) hypertension: Secondary | ICD-10-CM | POA: Diagnosis present

## 2016-08-13 DIAGNOSIS — D631 Anemia in chronic kidney disease: Secondary | ICD-10-CM | POA: Diagnosis not present

## 2016-08-13 DIAGNOSIS — S72401A Unspecified fracture of lower end of right femur, initial encounter for closed fracture: Secondary | ICD-10-CM | POA: Diagnosis not present

## 2016-08-13 DIAGNOSIS — N186 End stage renal disease: Secondary | ICD-10-CM | POA: Diagnosis not present

## 2016-08-13 DIAGNOSIS — Z01818 Encounter for other preprocedural examination: Secondary | ICD-10-CM | POA: Diagnosis not present

## 2016-08-13 DIAGNOSIS — S79911A Unspecified injury of right hip, initial encounter: Secondary | ICD-10-CM | POA: Diagnosis not present

## 2016-08-13 DIAGNOSIS — D62 Acute posthemorrhagic anemia: Secondary | ICD-10-CM | POA: Diagnosis not present

## 2016-08-13 DIAGNOSIS — G8911 Acute pain due to trauma: Secondary | ICD-10-CM | POA: Diagnosis not present

## 2016-08-13 DIAGNOSIS — R262 Difficulty in walking, not elsewhere classified: Secondary | ICD-10-CM | POA: Diagnosis not present

## 2016-08-13 DIAGNOSIS — S72324D Nondisplaced transverse fracture of shaft of right femur, subsequent encounter for closed fracture with routine healing: Secondary | ICD-10-CM | POA: Diagnosis not present

## 2016-08-13 LAB — GLUCOSE, CAPILLARY: Glucose-Capillary: 197 mg/dL — ABNORMAL HIGH (ref 65–99)

## 2016-08-13 LAB — BASIC METABOLIC PANEL
ANION GAP: 13 (ref 5–15)
BUN: 23 mg/dL — ABNORMAL HIGH (ref 6–20)
CHLORIDE: 99 mmol/L — AB (ref 101–111)
CO2: 29 mmol/L (ref 22–32)
Calcium: 8.6 mg/dL — ABNORMAL LOW (ref 8.9–10.3)
Creatinine, Ser: 5.5 mg/dL — ABNORMAL HIGH (ref 0.44–1.00)
GFR calc non Af Amer: 6 mL/min — ABNORMAL LOW (ref >60)
GFR, EST AFRICAN AMERICAN: 7 mL/min — AB (ref >60)
Glucose, Bld: 222 mg/dL — ABNORMAL HIGH (ref 65–99)
Potassium: 4.3 mmol/L (ref 3.5–5.1)
Sodium: 141 mmol/L (ref 135–145)

## 2016-08-13 LAB — CBC WITH DIFFERENTIAL/PLATELET
BASOS ABS: 0 10*3/uL (ref 0.0–0.1)
BASOS PCT: 0 %
Eosinophils Absolute: 0.3 10*3/uL (ref 0.0–0.7)
Eosinophils Relative: 3 %
HEMATOCRIT: 28.5 % — AB (ref 36.0–46.0)
HEMOGLOBIN: 8.9 g/dL — AB (ref 12.0–15.0)
Lymphocytes Relative: 10 %
Lymphs Abs: 1 10*3/uL (ref 0.7–4.0)
MCH: 29.6 pg (ref 26.0–34.0)
MCHC: 31.2 g/dL (ref 30.0–36.0)
MCV: 94.7 fL (ref 78.0–100.0)
Monocytes Absolute: 0.7 10*3/uL (ref 0.1–1.0)
Monocytes Relative: 8 %
NEUTROS ABS: 7.8 10*3/uL — AB (ref 1.7–7.7)
Neutrophils Relative %: 79 %
Platelets: 381 10*3/uL (ref 150–400)
RBC: 3.01 MIL/uL — ABNORMAL LOW (ref 3.87–5.11)
RDW: 16.7 % — ABNORMAL HIGH (ref 11.5–15.5)
WBC: 9.9 10*3/uL (ref 4.0–10.5)

## 2016-08-13 MED ORDER — HYDROCODONE-ACETAMINOPHEN 5-325 MG PO TABS
1.0000 | ORAL_TABLET | Freq: Four times a day (QID) | ORAL | Status: DC | PRN
  Administered 2016-08-14 – 2016-08-17 (×3): 1 via ORAL
  Filled 2016-08-13 (×2): qty 1

## 2016-08-13 MED ORDER — ONDANSETRON 4 MG PO TBDP
8.0000 mg | ORAL_TABLET | Freq: Once | ORAL | Status: AC
  Administered 2016-08-13: 8 mg via ORAL

## 2016-08-13 MED ORDER — SODIUM CHLORIDE 0.9 % IV SOLN
INTRAVENOUS | Status: DC
  Administered 2016-08-13: 19:00:00 via INTRAVENOUS

## 2016-08-13 MED ORDER — CALCIUM CARBONATE ANTACID 1250 MG/5ML PO SUSP: 500.0000 mg | Freq: Four times a day (QID) | ORAL | Status: DC | PRN

## 2016-08-13 MED ORDER — HYDRALAZINE HCL 50 MG PO TABS
50.0000 mg | ORAL_TABLET | Freq: Two times a day (BID) | ORAL | Status: DC
  Administered 2016-08-14 (×3): 50 mg via ORAL
  Filled 2016-08-13 (×5): qty 1

## 2016-08-13 MED ORDER — SEVELAMER CARBONATE 800 MG PO TABS: 800.0000 mg | ORAL_TABLET | Freq: Three times a day (TID) | ORAL | Status: DC

## 2016-08-13 MED ORDER — DOCUSATE SODIUM 283 MG RE ENEM: 1.0000 | ENEMA | RECTAL | Status: DC | PRN

## 2016-08-13 MED ORDER — METHOCARBAMOL 1000 MG/10ML IJ SOLN
500.0000 mg | Freq: Four times a day (QID) | INTRAVENOUS | Status: DC | PRN
  Filled 2016-08-13: qty 5

## 2016-08-13 MED ORDER — ONDANSETRON 4 MG PO TBDP
ORAL_TABLET | ORAL | Status: AC
  Filled 2016-08-13: qty 2

## 2016-08-13 MED ORDER — RENA-VITE PO TABS
1.0000 | ORAL_TABLET | Freq: Every day | ORAL | Status: DC
  Administered 2016-08-14 – 2016-08-16 (×4): 1 via ORAL
  Filled 2016-08-13 (×4): qty 1

## 2016-08-13 MED ORDER — LEVOTHYROXINE SODIUM 75 MCG PO TABS
75.0000 ug | ORAL_TABLET | Freq: Every day | ORAL | Status: DC
  Administered 2016-08-17: 75 ug via ORAL
  Filled 2016-08-13: qty 1

## 2016-08-13 MED ORDER — SORBITOL 70 % SOLN: 30.0000 mL | Status: DC | PRN

## 2016-08-13 MED ORDER — INSULIN ASPART 100 UNIT/ML ~~LOC~~ SOLN
0.0000 [IU] | Freq: Three times a day (TID) | SUBCUTANEOUS | Status: DC
  Administered 2016-08-16: 2 [IU] via SUBCUTANEOUS
  Administered 2016-08-17: 1 [IU] via SUBCUTANEOUS
  Administered 2016-08-17: 2 [IU] via SUBCUTANEOUS

## 2016-08-13 MED ORDER — HYDROXYZINE HCL 25 MG PO TABS: 25.0000 mg | ORAL_TABLET | Freq: Three times a day (TID) | ORAL | Status: DC | PRN

## 2016-08-13 MED ORDER — HYDROMORPHONE HCL 1 MG/ML IJ SOLN
INTRAMUSCULAR | Status: AC
  Filled 2016-08-13: qty 0.5

## 2016-08-13 MED ORDER — HYDROMORPHONE HCL 1 MG/ML IJ SOLN: 0.5000 mg | Freq: Once | INTRAMUSCULAR | Status: DC

## 2016-08-13 MED ORDER — ZOLPIDEM TARTRATE 5 MG PO TABS: 5.0000 mg | ORAL_TABLET | Freq: Every evening | ORAL | Status: DC | PRN

## 2016-08-13 MED ORDER — ISOSORBIDE MONONITRATE ER 30 MG PO TB24
30.0000 mg | ORAL_TABLET | Freq: Every day | ORAL | Status: DC
  Administered 2016-08-14 – 2016-08-17 (×2): 30 mg via ORAL
  Filled 2016-08-13 (×2): qty 1

## 2016-08-13 MED ORDER — NEPRO/CARBSTEADY PO LIQD: 237.0000 mL | Freq: Three times a day (TID) | ORAL | Status: DC | PRN

## 2016-08-13 MED ORDER — METHOCARBAMOL 500 MG PO TABS
500.0000 mg | ORAL_TABLET | Freq: Four times a day (QID) | ORAL | Status: DC | PRN
  Administered 2016-08-14: 500 mg via ORAL
  Filled 2016-08-13: qty 1

## 2016-08-13 MED ORDER — HYDROMORPHONE HCL 1 MG/ML IJ SOLN
0.5000 mg | Freq: Once | INTRAMUSCULAR | Status: DC
Start: 2016-08-13 — End: 2016-08-13

## 2016-08-13 MED ORDER — CAMPHOR-MENTHOL 0.5-0.5 % EX LOTN: 1.0000 "application " | TOPICAL_LOTION | Freq: Three times a day (TID) | CUTANEOUS | Status: DC | PRN

## 2016-08-13 MED ORDER — TETANUS-DIPHTH-ACELL PERTUSSIS 5-2.5-18.5 LF-MCG/0.5 IM SUSP
0.5000 mL | Freq: Once | INTRAMUSCULAR | Status: AC
  Administered 2016-08-13: 0.5 mL via INTRAMUSCULAR
  Filled 2016-08-13: qty 0.5

## 2016-08-13 MED ORDER — ASPIRIN 325 MG PO TABS
325.0000 mg | ORAL_TABLET | Freq: Every day | ORAL | Status: DC
  Administered 2016-08-14 – 2016-08-17 (×3): 325 mg via ORAL
  Filled 2016-08-13 (×3): qty 1

## 2016-08-13 MED ORDER — FENTANYL CITRATE (PF) 100 MCG/2ML IJ SOLN
25.0000 ug | Freq: Once | INTRAMUSCULAR | Status: AC
  Administered 2016-08-13: 25 ug via INTRAVENOUS
  Filled 2016-08-13: qty 2

## 2016-08-13 MED ORDER — INSULIN ASPART 100 UNIT/ML ~~LOC~~ SOLN
0.0000 [IU] | Freq: Every day | SUBCUTANEOUS | Status: DC
  Administered 2016-08-14: 2 [IU] via SUBCUTANEOUS

## 2016-08-13 MED ORDER — FENTANYL CITRATE (PF) 100 MCG/2ML IJ SOLN
12.5000 ug | INTRAMUSCULAR | Status: DC | PRN
  Administered 2016-08-13 – 2016-08-15 (×6): 12.5 ug via INTRAVENOUS
  Filled 2016-08-13 (×6): qty 2

## 2016-08-13 MED ORDER — ONDANSETRON HCL 4 MG/2ML IJ SOLN
4.0000 mg | Freq: Once | INTRAMUSCULAR | Status: AC
  Administered 2016-08-13: 4 mg via INTRAVENOUS
  Filled 2016-08-13: qty 2

## 2016-08-13 MED ORDER — NEBIVOLOL HCL 10 MG PO TABS
10.0000 mg | ORAL_TABLET | Freq: Every day | ORAL | Status: DC
  Administered 2016-08-14: 10 mg via ORAL
  Filled 2016-08-13 (×3): qty 1

## 2016-08-13 MED ORDER — PRAVASTATIN SODIUM 20 MG PO TABS
20.0000 mg | ORAL_TABLET | Freq: Every day | ORAL | Status: DC
  Administered 2016-08-14 – 2016-08-16 (×2): 20 mg via ORAL
  Filled 2016-08-13 (×2): qty 1

## 2016-08-13 NOTE — Discharge Instructions (Signed)
Your mother has a distal femur fracture to her right leg. It is my strongest recommendation she go to the emergency room on a stretcher by ambulance.

## 2016-08-13 NOTE — H&P (Signed)
History and Physical    [name]:[id number] DOB: 1928/06/29 DOA: 08/13/2016  PCP: Binnie Rail, MD Consultants:  Selena Batten - orthopedics; Florene Glen - nephrology Patient coming from: home - lives with daughters; [name]: daughter, [phone]  Chief Complaint: fall  HPI: [name] is a 83 y.o. female with medical history significant of DM; HLD; hypothyroidism; afib not on AC; chronic combined heart failure; HTN; ESRD on HD with prior fractures to right leg presenting with knee fracture after a mechanical fall.  Patient reports that she "just fell".  She was coming down the steps outside and her foot caught in the mat.  She landed on her right knee and then fell over.  Severe pain.  Only injury was the knee.  Had a knee fracture (tib/fib)  there in the past.  Had a skin graft last year but it failed - had formed an eschar and she was sent to [facility] for the skin grafting.  She refused to go back and has been putting her own dressing on it.  It did heal but it broke through again.     ED Course: mechanical fall with knee fracture; ortho consult and they will see patient.  Admit.  Review of Systems: As per HPI; otherwise review of systems reviewed and negative.   Ambulatory Status:  Ambulates with a walker  Past Medical History:  Diagnosis Date  . Arthritis   . Atrial fibrillation ([place]) 06/25/2012   2D Echo - EF 45-50, left atrium severely dilated, mitral valve heavily calcified annulus with calcification of the subvalvular apparatus - no longer on Coumadin  . Breast cancer ([place])   . CHF (congestive heart failure) ([place])   . DM2 (diabetes mellitus, type 2) ([place])   . ESRD (end stage renal disease) ([place])    HD MWF - [facility]  . HTN (hypertension)   . Hyperlipidemia   . Hyperparathyroidism, secondary renal ([place])   . Hypothyroid   . Shortness of breath   . Tibia/fibula fracture 07/10/2011   right - nonunion repair 06/2012    Past Surgical History:  Procedure Laterality Date  .  ABDOMINAL SURGERY    . ANKLE FUSION Right   . ARTERIOVENOUS GRAFT PLACEMENT Right   . BREAST SURGERY Left    Mastectomy  . EYE SURGERY Bilateral    Cataract  . MASTECTOMY    . ORIF TIBIA FRACTURE Right 06/16/2012   Procedure: OPEN REDUCTION INTERNAL FIXATION (ORIF) TIBIA FRACTURE;  Surgeon: Rozanna Box, MD;  Location: [facility];  Service: Orthopedics;  Laterality: Right;  . THYROID SURGERY      Social History   Social History  . Marital status: Single    Spouse name: N/A  . Number of children: N/A  . Years of education: N/A   Occupational History  . Not on file.   Social History Main Topics  . Smoking status: Never Smoker  . Smokeless tobacco: Current User    Types: Snuff  . Alcohol use No  . Drug use: No  . Sexual activity: No   Other Topics Concern  . Not on file   Social History Narrative  . No narrative on file    Allergies  Allergen Reactions  . Codeine Shortness Of Breath, Swelling and Other (See Comments)    Swelling of the tongue. Pt tolerates Vicodin.  . Coreg [Carvedilol] Other (See Comments)    Headaches   . Lopressor [Metoprolol Tartrate] Other (See Comments)    GI upset  . Metoprolol  Other (See Comments)    GI upset  . Oxycodone Other (See Comments)    Hallucinations (also with Oxycontin)    Family History  Problem Relation Age of Onset  . Hyperlipidemia Father   . Diabetes Father   . Breast cancer Sister     Prior to Admission medications   Medication Sig Start Date End Date Taking? Authorizing Provider  aspirin 325 MG tablet Take 1 tablet (325 mg total) by mouth daily. 07/28/15   Angiulli, Lavon Paganini, PA-C  b complex-vitamin c-folic acid (NEPHRO-VITE) 0.8 MG TABS tablet Take 1 tablet by mouth at bedtime. 08/29/15   Binnie Rail, MD  darbepoetin (ARANESP) 100 MCG/0.5ML SOLN Inject 0.5 mLs (100 mcg total) into the vein every Wednesday with hemodialysis. 07/25/12   Erlene Quan, PA-C  diclofenac sodium (VOLTAREN) 1 % GEL Apply 2 g topically 3  (three) times daily. 07/28/15   Angiulli, Lavon Paganini, PA-C  glimepiride (AMARYL) 1 MG tablet TAKE 3 TABLETS(3 MG) BY MOUTH DAILY WITH BREAKFAST 04/01/16   Burns, Claudina Lick, MD  hydrALAZINE (APRESOLINE) 50 MG tablet TAKE 1 TABLET(50 MG) BY MOUTH TWICE DAILY 01/15/16   Burns, Claudina Lick, MD  isosorbide mononitrate (IMDUR) 30 MG 24 hr tablet Take 1 tablet (30 mg total) by mouth daily. 07/28/15   Angiulli, Lavon Paganini, PA-C  levothyroxine (SYNTHROID, LEVOTHROID) 75 MCG tablet Take 1 tablet (75 mcg total) by mouth daily before breakfast. --- Office visit needed for further refills 05/15/16   Binnie Rail, MD  nebivolol (BYSTOLIC) 10 MG tablet Take 1 tablet (10 mg total) by mouth daily. 08/29/15   Binnie Rail, MD  pravastatin (PRAVACHOL) 20 MG tablet Take 1 tablet (20 mg total) by mouth daily at 6 PM. 08/29/15   Burns, Claudina Lick, MD  sevelamer carbonate (RENVELA) 800 MG tablet Take 1 tablet (800 mg total) by mouth 3 (three) times daily with meals. 08/29/15   Binnie Rail, MD  Vitamin D, Ergocalciferol, (DRISDOL) 50000 UNITS CAPS Take 50,000 Units by mouth every 7 (seven) days. Takes on Fridays.    [provider]  vitamin E 400 UNIT capsule Take 400 Units by mouth daily.    [provider]    Physical Exam: Vitals:   08/13/16 2015 08/13/16 2145 08/13/16 2200 08/13/16 2301  BP: (!) 107/96 (!) 111/48 (!) 122/43 (!) 139/36  Pulse: 64 66 65 65  Resp:    16  Temp:    98.1 F (36.7 C)  TempSrc:    Oral  SpO2: 99% 98% 94% 99%  Weight:    59.5 kg (131 lb 1.6 oz)  Height:    5\' 1"  (1.549 m)     General:  Appears calm and comfortable and is NAD Eyes:  PERRL, EOMI, normal lids, iris ENT:  Hard of hearing, normal lips & tongue, mmm Neck:  no LAD, masses or thyromegaly Cardiovascular:  RRR, no m/r/g. No LE edema.  Respiratory:  CTA bilaterally, no w/r/r. Normal respiratory effort. Abdomen:  soft, ntnd, NABS Skin:  Chronic knee injuries and prior skin grafting, now with superficial mild abrasion  but <1cm region with purulent drainage Musculoskeletal:  Edema of right knee region with significant reluctance to move the joint in any direction Psychiatric:  grossly normal mood and affect, speech fluent and appropriate, AOx3 Neurologic:  CN 2-12 grossly intact, moves all extremities in coordinated fashion (sparing the right knee due to pain), sensation intact  Labs on Admission: I have personally reviewed following labs and  imaging studies  CBC:  Recent Labs Lab 08/13/16 1639  WBC 9.9  NEUTROABS 7.8*  HGB 8.9*  HCT 28.5*  MCV 94.7  PLT 716   Basic Metabolic Panel:  Recent Labs Lab 08/13/16 1639  NA 141  K 4.3  CL 99*  CO2 29  GLUCOSE 222*  BUN 23*  CREATININE 5.50*  CALCIUM 8.6*   GFR: Estimated Creatinine Clearance: 6.2 mL/min (A) (by C-G formula based on SCr of 5.5 mg/dL (H)). Liver Function Tests: No results for input(s): AST, ALT, ALKPHOS, BILITOT, PROT, ALBUMIN in the last 168 hours. No results for input(s): LIPASE, AMYLASE in the last 168 hours. No results for input(s): AMMONIA in the last 168 hours. Coagulation Profile: No results for input(s): INR, PROTIME in the last 168 hours. Cardiac Enzymes: No results for input(s): CKTOTAL, CKMB, CKMBINDEX, TROPONINI in the last 168 hours. BNP (last 3 results) No results for input(s): PROBNP in the last 8760 hours. HbA1C: No results for input(s): HGBA1C in the last 72 hours. CBG:  Recent Labs Lab 08/13/16 2331  GLUCAP 197*   Lipid Profile: No results for input(s): CHOL, HDL, LDLCALC, TRIG, CHOLHDL, LDLDIRECT in the last 72 hours. Thyroid Function Tests: No results for input(s): TSH, T4TOTAL, FREET4, T3FREE, THYROIDAB in the last 72 hours. Anemia Panel: No results for input(s): VITAMINB12, FOLATE, FERRITIN, TIBC, IRON, RETICCTPCT in the last 72 hours. Urine analysis:    Component Value Date/Time   COLORURINE YELLOW 06/16/2012 0717   APPEARANCEUR HAZY (A) 06/16/2012 0717   LABSPEC 1.015 06/16/2012 0717     PHURINE 6.0 06/16/2012 0717   GLUCOSEU 250 (A) 06/16/2012 0717   HGBUR SMALL (A) 06/16/2012 0717   BILIRUBINUR NEGATIVE 06/16/2012 0717   KETONESUR NEGATIVE 06/16/2012 0717   PROTEINUR 100 (A) 06/16/2012 0717   UROBILINOGEN 0.2 06/16/2012 0717   NITRITE NEGATIVE 06/16/2012 0717   LEUKOCYTESUR TRACE (A) 06/16/2012 0717    Creatinine Clearance: Estimated Creatinine Clearance: 6.2 mL/min (A) (by C-G formula based on SCr of 5.5 mg/dL (H)).  Sepsis Labs: @LABRCNTIP (procalcitonin:4,lacticidven:4) )No results found for this or any previous visit (from the past 240 hour(s)).   Radiological Exams on Admission: Dg Knee Complete 4 Views Right  Result Date: 08/13/2016 CLINICAL DATA:  Fall.  History of the surgery. EXAM: RIGHT KNEE - COMPLETE 4+ VIEW COMPARISON:  07/10/2011. FINDINGS: Prior ORIF right tibia. An acute fracture of the proximal tibia appears to be present. Acute fracture of the distal femoral shaft is present. Fractures are slightly separated. IMPRESSION: 1.  Acute fractures of the distal femur. 2. Prior ORIF right tibia. Superimposed acute fracture appears to be present. Both femoral and tibial fractures are slightly separated. Electronically Signed   By: Marcello Moores  Register   On: 08/13/2016 13:27    EKG: pending  Assessment/Plan Principal Problem:   Leg fracture Active Problems:   HTN (hypertension)   DM2 (diabetes mellitus, type 2) (HCC)   Anemia   Chronic combined systolic and diastolic heart failure EF 45-50% by echo 06/25/2012   ESRD on dialysis ([place])   PAF (paroxysmal atrial fibrillation) (H[facility])   Leg fracture -Mechanical fall resulting in fractures of the proximal tibia and distal femur -This surgery may be completed by h/o prior fracture with ORIF in this region as well as h/o poor wound healing with resultant need for skin grafting which ultimately failed -The wound does appear to have some purulence now; will cover with Ancef x 1 (which will last through dialysis  tomorrow and then may require re-dosing) -Orthopedics  consult in AM -NPO after midnight in anticipation of surgical repair tomorrow -SCDs overnight, start Lovenox post-operatively (or as per ortho) -CXR and EKG prior to surgery - not done in ER -Pain control with Vicodin and fentanyl -SW consult for rehab placement -Will need PT consult post-operatively  HTN -Continue Hydralazine, Bystolic  DM -Glucose 086, 197 -Hold Amaryl -Check A1c -Cover with sensitive scale SSI  ESRD -BUN 23/Creatinine 5.50/GFR 6 -Continue Renvela -MWF HD; I called and left a message on the Maintenance HD voicemail line to initiate HD tomorrow -Nephrology prn order set utilized  Anemia -Hgb 8.9, slightly lower than apparent baseline of 9.5-10 -On Aranesp at HD -Will need to monitor this  CHF -Last echo appears to have been in 4/14 -At that time, EF 45-50% with no evaluation of diastolic function due to afib -Currently compensated  Afib -Her CHA2DS2-VASc score is at least 6, with a 9.8% stroke rate/year -Based on a note from 2015, her Coumadin was stopped by her MD in [place] and she was started on ASA 81 mg daily -Will continue ASA for now -Consider discussion regarding resumption of afib in the future if needed.  DVT prophylaxis: SCDs Code Status:  Full - confirmed with patient/family Family Communication: Multiple family members (including daughter who is an Chief Strategy Officer at [employer]) present throughout evaluation  Disposition Plan: Likely to need inpatient rehab Consults called: Orthopedics (called by ER); Nephrology (for maintenance HD; SW; will need PT post-operatively Admission status: admit   Karmen Bongo MD [facility]  If 7PM-7AM, please contact night-coverage [email or web address] Password [email or web address]  08/13/2016, 11:59 PM

## 2016-08-13 NOTE — ED Triage Notes (Signed)
Pt  Fell  Today  And  inj  Her  r  Knee            She    Has  Ice  Pack applied  To  The  Affected   Knee           She  Is awake  And  Alert  She  Has  No  Other  injurys

## 2016-08-13 NOTE — Medical Student Note (Signed)
[facility] Statistician Note For educational purposes for Medical, PA and NP students only and not part of the legal medical record.   CSN: [id number] Arrival date & time: 08/13/16  1149     History   Chief Complaint Chief Complaint  Patient presents with  . Knee Injury    HPI [name] is a 83 y.o. female.  Pt is a 83 year old female that presents to the clinic today with right knee pain and swelling after a fall this am. sts she tripped and fell down 2 steps landing on knee. Denies any other injuries. Pt hx of injury in the same knee 1 year ago.    The history is provided by the patient.    Past Medical History:  Diagnosis Date  . Arthritis   . Atrial fibrillation ([place]) dx 06/2012   on coumadin  . Atrial fibrillation ([place]) 06/25/2012   2D Echo - EF 45-50, left atrium severely dilated, mitral valve heavily calcified annulus with calcification of the subvalvular apparatus  . Breast cancer ([place])   . CHF (congestive heart failure) ([place])   . DM2 (diabetes mellitus, type 2) ([place])   . ESRD (end stage renal disease) ([place])    HD MWF - [facility]  . HTN (hypertension)   . Hyperlipidemia   . Hyperparathyroidism, secondary renal ([place])   . Hypothyroid   . Shortness of breath   . Tibia/fibula fracture 07/10/2011   right - nonunion repair 06/2012    Patient Active Problem List   Diagnosis Date Noted  . Hypothyroidism 08/30/2015  . Labile blood glucose   . Memory loss   . Hip fracture ([place]) 07/12/2015  . Swelling   . ESRD on dialysis ([place])   . PAF (paroxysmal atrial fibrillation) ([place])   . Long term (current) use of anticoagulants 07/30/2012  . Chronic combined systolic and diastolic heart failure EF 45-50% by echo 06/25/2012 07/30/2012  . Breast cancer, s/p Lt mastectomy 07/22/2012  . Chronic anticoagulation 07/22/2012  . Pleural effusion due to CHF (congestive heart failure) ([place]) 07/20/2012  . Atrial fibrillation, spont convers NSR 06/26/12  06/25/2012  . Hypotension, unspecified 06/25/2012  . Right tibial fracture 06/21/2012  . Metabolic bone disease 33/82/5053  . Secondary hyperparathyroidism of renal origin ([place]) 06/19/2012  . Nonunion of fracture, R tibia 06/17/2012  . H/O radioactive iodine thyroid ablation   . Closed fracture of right fibula and tibia 07/10/2011  . HTN (hypertension) 07/10/2011  . DM2 (diabetes mellitus, type 2) ([place]) 07/10/2011  . Hyperlipidemia 07/10/2011  . Anemia 07/10/2011    Past Surgical History:  Procedure Laterality Date  . ABDOMINAL SURGERY    . ANKLE FUSION Right   . ARTERIOVENOUS GRAFT PLACEMENT Right   . BREAST SURGERY Left    Mastectomy  . EYE SURGERY Bilateral    Cataract  . MASTECTOMY    . ORIF TIBIA FRACTURE Right 06/16/2012   Procedure: OPEN REDUCTION INTERNAL FIXATION (ORIF) TIBIA FRACTURE;  Surgeon: Rozanna Box, MD;  Location: [facility];  Service: Orthopedics;  Laterality: Right;  . THYROID SURGERY      OB History    No data available       Home Medications    Prior to Admission medications   Medication Sig Start Date End Date Taking? Authorizing Provider  aspirin 325 MG tablet Take 1 tablet (325 mg total) by mouth daily. 07/28/15   Angiulli, Lavon Paganini, PA-C  b complex-vitamin c-folic acid (NEPHRO-VITE) 0.8 MG TABS tablet  Take 1 tablet by mouth at bedtime. 08/29/15   Binnie Rail, MD  darbepoetin (ARANESP) 100 MCG/0.5ML SOLN Inject 0.5 mLs (100 mcg total) into the vein every Wednesday with hemodialysis. 07/25/12   Erlene Quan, PA-C  diclofenac sodium (VOLTAREN) 1 % GEL Apply 2 g topically 3 (three) times daily. 07/28/15   Angiulli, Lavon Paganini, PA-C  glimepiride (AMARYL) 1 MG tablet TAKE 3 TABLETS(3 MG) BY MOUTH DAILY WITH BREAKFAST 04/01/16   Burns, Claudina Lick, MD  hydrALAZINE (APRESOLINE) 50 MG tablet TAKE 1 TABLET(50 MG) BY MOUTH TWICE DAILY 01/15/16   Burns, Claudina Lick, MD  isosorbide mononitrate (IMDUR) 30 MG 24 hr tablet Take 1 tablet (30 mg total) by mouth daily. 07/28/15    Angiulli, Lavon Paganini, PA-C  levothyroxine (SYNTHROID, LEVOTHROID) 75 MCG tablet Take 1 tablet (75 mcg total) by mouth daily before breakfast. --- Office visit needed for further refills 05/15/16   Binnie Rail, MD  nebivolol (BYSTOLIC) 10 MG tablet Take 1 tablet (10 mg total) by mouth daily. 08/29/15   Binnie Rail, MD  pravastatin (PRAVACHOL) 20 MG tablet Take 1 tablet (20 mg total) by mouth daily at 6 PM. 08/29/15   Burns, Claudina Lick, MD  sevelamer carbonate (RENVELA) 800 MG tablet Take 1 tablet (800 mg total) by mouth 3 (three) times daily with meals. 08/29/15   Binnie Rail, MD  Vitamin D, Ergocalciferol, (DRISDOL) 50000 UNITS CAPS Take 50,000 Units by mouth every 7 (seven) days. Takes on Fridays.    [provider]  vitamin E 400 UNIT capsule Take 400 Units by mouth daily.    [provider]    Family History Family History  Problem Relation Age of Onset  . Hyperlipidemia Father   . Diabetes Father   . Breast cancer Sister     Social History Social History  Substance Use Topics  . Smoking status: Never Smoker  . Smokeless tobacco: Current User    Types: Snuff  . Alcohol use No     Allergies   Codeine; Coreg [carvedilol]; Lopressor [metoprolol tartrate]; and Metoprolol   Review of Systems Review of Systems  Constitutional: Negative.   HENT: Negative.   Eyes: Negative.   Respiratory: Negative.   Cardiovascular: Negative.   Gastrointestinal: Negative.   Endocrine: Negative.   Genitourinary: Negative.   Musculoskeletal: Positive for arthralgias and joint swelling.  Skin: Negative.   Allergic/Immunologic: Negative.   Neurological: Negative for weakness and numbness.  Hematological: Negative.      Physical Exam Updated Vital Signs BP (!) 155/76 (BP Location: Other (Comment)) Comment: notified rn Comment (BP Location): left ankle  Pulse 60   Temp 98.3 F (36.8 C) (Oral)   Resp 16   SpO2 100%   Physical Exam  Constitutional: She is oriented to  person, place, and time. She appears well-developed and well-nourished.  HENT:  Head: Normocephalic and atraumatic.  Eyes: Conjunctivae and EOM are normal. Pupils are equal, round, and reactive to light.  Neck: Normal range of motion. Neck supple.  Cardiovascular: Normal rate, regular rhythm and normal heart sounds.   Pulmonary/Chest: Effort normal and breath sounds normal.  Abdominal: Soft. Bowel sounds are normal.  Musculoskeletal: She exhibits edema and tenderness.  Localized tenderness and swelling to knee.  Neurological: She is alert and oriented to person, place, and time.  Skin: Skin is warm and dry.  Psychiatric: She has a normal mood and affect.     ED Treatments / Results  Labs (all labs ordered  are listed, but only abnormal results are displayed) Labs Reviewed - No data to display  EKG  EKG Interpretation None       Radiology No results found.  Procedures Procedures (including critical care time)  Medications Ordered in ED Medications - No data to display   Initial Impression / Assessment and Plan / ED Course  I have reviewed the triage vital signs and the nursing notes.  Pertinent labs & imaging results that were available during my care of the patient were reviewed by me and considered in my medical decision making (see chart for details).       Final Clinical Impressions(s) / ED Diagnoses   Final diagnoses:  None    New Prescriptions New Prescriptions   No medications on file

## 2016-08-13 NOTE — ED Provider Notes (Signed)
[facility] DEPT Provider Note   CSN: [id number] Arrival date & time: 08/13/16  1354     History   Chief Complaint Chief Complaint  Patient presents with  . Knee Injury    HPI [name] is a 83 y.o. female.  She presents for evaluation of injury to right knee, she was walking outside, slipped on wet steps, fell injured right knee only.  She presents for evaluation, by EMS.  She dialyzed yesterday and her usual day.  She did not have any other prodrome, or other injuries.  She denies recent fever, chills, cough, chest pain, weakness or dizziness.  She is being followed for a chronic right knee wound, at the [facility] wound care center.  Last seen there several months ago.  She has been changing dressings on the wound frequently.  She also has a wound on her right ankle, chronically.  There are no other known modifying factors.  HPI  Past Medical History:  Diagnosis Date  . Arthritis   . Atrial fibrillation ([place]) 06/25/2012   2D Echo - EF 45-50, left atrium severely dilated, mitral valve heavily calcified annulus with calcification of the subvalvular apparatus - no longer on Coumadin  . Breast cancer ([place])   . CHF (congestive heart failure) ([place])   . DM2 (diabetes mellitus, type 2) ([place])   . ESRD (end stage renal disease) ([place])    HD MWF - [facility]  . HTN (hypertension)   . Hyperlipidemia   . Hyperparathyroidism, secondary renal ([place])   . Hypothyroid   . Shortness of breath   . Tibia/fibula fracture 07/10/2011   right - nonunion repair 06/2012    Patient Active Problem List   Diagnosis Date Noted  . Leg fracture 08/13/2016  . Hypothyroidism 08/30/2015  . Labile blood glucose   . Memory loss   . Hip fracture ([place]) 07/12/2015  . Swelling   . ESRD on dialysis ([place])   . PAF (paroxysmal atrial fibrillation) ([place])   . Chronic combined systolic and diastolic heart failure EF 45-50% by echo 06/25/2012 07/30/2012  . Breast cancer, s/p Lt mastectomy 07/22/2012  . Pleural  effusion due to CHF (congestive heart failure) ([place]) 07/20/2012  . Hypotension, unspecified 06/25/2012  . Right tibial fracture 06/21/2012  . Metabolic bone disease 30/86/5784  . Secondary hyperparathyroidism of renal origin ([place]) 06/19/2012  . Nonunion of fracture, R tibia 06/17/2012  . H/O radioactive iodine thyroid ablation   . Closed fracture of right fibula and tibia 07/10/2011  . HTN (hypertension) 07/10/2011  . DM2 (diabetes mellitus, type 2) ([place]) 07/10/2011  . Hyperlipidemia 07/10/2011  . Anemia 07/10/2011    Past Surgical History:  Procedure Laterality Date  . ABDOMINAL SURGERY    . ANKLE FUSION Right   . ARTERIOVENOUS GRAFT PLACEMENT Right   . BREAST SURGERY Left    Mastectomy  . EYE SURGERY Bilateral    Cataract  . MASTECTOMY    . ORIF TIBIA FRACTURE Right 06/16/2012   Procedure: OPEN REDUCTION INTERNAL FIXATION (ORIF) TIBIA FRACTURE;  Surgeon: Rozanna Box, MD;  Location: [place];  Service: Orthopedics;  Laterality: Right;  . THYROID SURGERY      OB History    No data available       Home Medications    Prior to Admission medications   Medication Sig Start Date End Date Taking? Authorizing Provider  aspirin EC 81 MG tablet Take 81 mg by mouth daily.   Yes [provider]  b complex-vitamin c-folic acid (  NEPHRO-VITE) 0.8 MG TABS tablet Take 1 tablet by mouth at bedtime. 08/29/15  Yes Burns, Claudina Lick, MD  Cholecalciferol (VITAMIN D-3 PO) Take 1 capsule by mouth daily.   Yes [provider]  darbepoetin (ARANESP) 100 MCG/0.5ML SOLN Inject 0.5 mLs (100 mcg total) into the vein every Wednesday with hemodialysis. 07/25/12  Yes Kilroy, Luke K, PA-C  glimepiride (AMARYL) 1 MG tablet TAKE 3 TABLETS(3 MG) BY MOUTH DAILY WITH BREAKFAST Patient taking differently: Take 1 mg by mouth once a day 04/01/16  Yes Burns, Claudina Lick, MD  hydrALAZINE (APRESOLINE) 50 MG tablet TAKE 1 TABLET(50 MG) BY MOUTH TWICE DAILY Patient taking differently: Take 50 mg by mouth once  a day 01/15/16  Yes Burns, Claudina Lick, MD  isosorbide mononitrate (IMDUR) 30 MG 24 hr tablet Take 1 tablet (30 mg total) by mouth daily. 07/28/15  Yes Angiulli, Lavon Paganini, PA-C  levothyroxine (SYNTHROID, LEVOTHROID) 75 MCG tablet Take 1 tablet (75 mcg total) by mouth daily before breakfast. --- Office visit needed for further refills Patient taking differently: Take 75 mcg by mouth daily before breakfast.  05/15/16  Yes Burns, Claudina Lick, MD  nebivolol (BYSTOLIC) 10 MG tablet Take 1 tablet (10 mg total) by mouth daily. 08/29/15  Yes Burns, Claudina Lick, MD  pravastatin (PRAVACHOL) 20 MG tablet Take 1 tablet (20 mg total) by mouth daily at 6 PM. 08/29/15  Yes Burns, Claudina Lick, MD  sevelamer carbonate (RENVELA) 800 MG tablet Take 1 tablet (800 mg total) by mouth 3 (three) times daily with meals. 08/29/15  Yes Burns, Claudina Lick, MD  vitamin E 200 UNIT capsule Take 200 Units by mouth daily.   Yes [provider]  diclofenac sodium (VOLTAREN) 1 % GEL Apply 2 g topically 3 (three) times daily. Patient not taking: Reported on 08/13/2016 07/28/15   Angiulli, Lavon Paganini, PA-C    Family History Family History  Problem Relation Age of Onset  . Hyperlipidemia Father   . Diabetes Father   . Breast cancer Sister     Social History Social History  Substance Use Topics  . Smoking status: Never Smoker  . Smokeless tobacco: Current User    Types: Snuff  . Alcohol use No     Allergies   Codeine; Coreg [carvedilol]; Lopressor [metoprolol tartrate]; Metoprolol; and Oxycodone   Review of Systems Review of Systems  All other systems reviewed and are negative.    Physical Exam Updated Vital Signs BP (!) 139/36 (BP Location: Left Leg)   Pulse 65   Temp 98.1 F (36.7 C) (Oral)   Resp 16   Ht 5\' 1"  (1.549 m)   Wt 59.5 kg (131 lb 1.6 oz)   SpO2 99%   BMI 24.77 kg/m   Physical Exam  Constitutional: She is oriented to person, place, and time. She appears well-developed.  Elderly, frail  HENT:  Head:  Normocephalic and atraumatic.  Eyes: Conjunctivae and EOM are normal. Pupils are equal, round, and reactive to light.  Neck: Normal range of motion and phonation normal. Neck supple.  Cardiovascular: Normal rate and regular rhythm.   AV fistula right upper arm, with normal thrill, nontender.  Pulmonary/Chest: Effort normal and breath sounds normal. She exhibits no tenderness.  Abdominal: Soft. She exhibits no distension. There is no tenderness. There is no guarding.  Musculoskeletal:  Right knee tender and swollen anteriorly.  Chronic appearing wound with some granulation tissue and a small amount of exudate, but no associated fluctuance, bleeding or tenderness.  See picture  depicted below.  Patient resists movement of the right knee, secondary to pain.  Neurological: She is alert and oriented to person, place, and time. She exhibits normal muscle tone.  Skin: Skin is warm and dry.  Psychiatric: She has a normal mood and affect. Her behavior is normal. Judgment and thought content normal.  Nursing note and vitals reviewed.          ED Treatments / Results  Labs (all labs ordered are listed, but only abnormal results are displayed) Labs Reviewed  CBC WITH DIFFERENTIAL/PLATELET - Abnormal; Notable for the following:       Result Value   RBC 3.01 (*)    Hemoglobin 8.9 (*)    HCT 28.5 (*)    RDW 16.7 (*)    Neutro Abs 7.8 (*)    All other components within normal limits  BASIC METABOLIC PANEL - Abnormal; Notable for the following:    Chloride 99 (*)    Glucose, Bld 222 (*)    BUN 23 (*)    Creatinine, Ser 5.50 (*)    Calcium 8.6 (*)    GFR calc non Af Amer 6 (*)    GFR calc Af Amer 7 (*)    All other components within normal limits  GLUCOSE, CAPILLARY - Abnormal; Notable for the following:    Glucose-Capillary 197 (*)    All other components within normal limits  HEMOGLOBIN A1C    EKG  EKG Interpretation None       Radiology Dg Knee Complete 4 Views  Right  Result Date: 08/13/2016 CLINICAL DATA:  Fall.  History of the surgery. EXAM: RIGHT KNEE - COMPLETE 4+ VIEW COMPARISON:  07/10/2011. FINDINGS: Prior ORIF right tibia. An acute fracture of the proximal tibia appears to be present. Acute fracture of the distal femoral shaft is present. Fractures are slightly separated. IMPRESSION: 1.  Acute fractures of the distal femur. 2. Prior ORIF right tibia. Superimposed acute fracture appears to be present. Both femoral and tibial fractures are slightly separated. Electronically Signed   By: Marcello Moores  Register   On: 08/13/2016 13:27    Procedures Procedures (including critical care time)  Medications Ordered in ED Medications  levothyroxine (SYNTHROID, LEVOTHROID) tablet 75 mcg (not administered)  hydrALAZINE (APRESOLINE) tablet 50 mg (not administered)  multivitamin (RENA-VIT) tablet 1 tablet (not administered)  nebivolol (BYSTOLIC) tablet 10 mg (not administered)  pravastatin (PRAVACHOL) tablet 20 mg (not administered)  sevelamer carbonate (RENVELA) tablet 800 mg (not administered)  aspirin tablet 325 mg (not administered)  isosorbide mononitrate (IMDUR) 24 hr tablet 30 mg (not administered)  HYDROcodone-acetaminophen (NORCO/VICODIN) 5-325 MG per tablet 1-2 tablet (not administered)  zolpidem (AMBIEN) tablet 5 mg (not administered)  sorbitol 70 % solution 30 mL (not administered)  docusate sodium (ENEMEEZ) enema 283 mg (not administered)  camphor-menthol (SARNA) lotion 1 application (not administered)    And  hydrOXYzine (ATARAX/VISTARIL) tablet 25 mg (not administered)  calcium carbonate (dosed in mg elemental calcium) suspension 500 mg of elemental calcium (not administered)  feeding supplement (NEPRO CARB STEADY) liquid 237 mL (not administered)  methocarbamol (ROBAXIN) tablet 500 mg (not administered)    Or  methocarbamol (ROBAXIN) 500 mg in dextrose 5 % 50 mL IVPB (not administered)  insulin aspart (novoLOG) injection 0-9 Units (not  administered)  insulin aspart (novoLOG) injection 0-5 Units (not administered)  fentaNYL (SUBLIMAZE) injection 12.5 mcg (12.5 mcg Intravenous Given 08/13/16 2158)  Tdap (BOOSTRIX) injection 0.5 mL (0.5 mLs Intramuscular Given 08/13/16 1704)  fentaNYL (SUBLIMAZE) injection 25  mcg (25 mcg Intravenous Given 08/13/16 1845)  ondansetron (ZOFRAN) injection 4 mg (4 mg Intravenous Given 08/13/16 1845)     Initial Impression / Assessment and Plan / ED Course  I have reviewed the triage vital signs and the nursing notes.  Pertinent labs & imaging results that were available during my care of the patient were reviewed by me and considered in my medical decision making (see chart for details).      Patient Vitals for the past 24 hrs:  BP Temp Temp src Pulse Resp SpO2 Height Weight  08/13/16 2301 (!) 139/36 98.1 F (36.7 C) Oral 65 16 99 % 5\' 1"  (1.549 m) 59.5 kg (131 lb 1.6 oz)  08/13/16 2200 (!) 122/43 - - 65 - 94 % - -  08/13/16 2145 (!) 111/48 - - 66 - 98 % - -  08/13/16 2015 (!) 107/96 - - 64 - 99 % - -  08/13/16 1930 (!) 130/50 - - (!) 57 - 97 % - -  08/13/16 1841 (!) 150/44 - - (!) 56 19 99 % - -  08/13/16 1830 (!) 150/44 - - (!) 57 - 99 % - -  08/13/16 1815 (!) 153/55 - - 62 - 98 % - -  08/13/16 1800 (!) 144/53 - - 61 - 98 % - -  08/13/16 1745 (!) 158/41 - - 64 - 100 % - -  08/13/16 1700 (!) 121/40 - - (!) 56 - 99 % - -  08/13/16 1656 115/68 - - 63 16 100 % - -  08/13/16 1645 115/68 - - 63 - 100 % - -  08/13/16 1630 136/68 - - 64 - 98 % - -  08/13/16 1615 (!) 118/94 - - 61 - 99 % - -  08/13/16 1600 (!) 144/51 - - (!) 46 - 100 % - -  08/13/16 1545 (!) 123/44 - - 61 - 99 % - -  08/13/16 1424 (!) 163/73 97.8 F (36.6 C) Oral (!) 53 16 95 % - -    Case discussed with orthopedics, Dr. Marcelino Scot who will have his partner Dr. Percell Miller see the patient.  He requested the patient be admitted by the hospitalist service.  He anticipates surgical repair tomorrow.  6:13 PM-Consult complete with  hospitalist. Patient case explained and discussed.  She agrees to admit patient for further evaluation and treatment. Call ended at 48: 30  Final Clinical Impressions(s) / ED Diagnoses   Final diagnoses:  Closed nondisplaced transverse fracture of shaft of right femur, initial encounter ([place])  Other closed fracture of proximal end of right tibia, initial encounter    Fall, likely mechanical, with fractures distal femur, and proximal tibia.  Isolated injuries.  Suspect mechanical fall.  Patient will need to be admitted for further treatment and evaluation.  Nursing Notes Reviewed/ Care Coordinated Applicable Imaging Reviewed Interpretation of Laboratory Data incorporated into ED treatment   Plan: [place] Prescriptions Current Discharge Medication List       Daleen Bo, MD 08/14/16 0007

## 2016-08-13 NOTE — ED Provider Notes (Signed)
CSN: [id number]     Arrival date & time 08/13/16  1149 History   First MD Initiated Contact with Patient 08/13/16 1309     Chief Complaint  Patient presents with  . Knee Injury   (Consider location/radiation/quality/duration/timing/severity/associated sxs/prior Treatment) 83 year old female with a past history of atrial fibrillation, CHF, diabetes, and end-stage renal failure, amongst other multiple diagnoses, presents to the clinic for right knee pain and swelling. States that she fell this morning, tripped and fell down 2 steps landing on her knee. Denies any other injury, does have a history of injury to the same leg approximately one year ago.   The history is provided by the patient, a caregiver and a relative.    Past Medical History:  Diagnosis Date  . Arthritis   . Atrial fibrillation ([place]) dx 06/2012   on coumadin  . Atrial fibrillation ([place]) 06/25/2012   2D Echo - EF 45-50, left atrium severely dilated, mitral valve heavily calcified annulus with calcification of the subvalvular apparatus  . Breast cancer ([place])   . CHF (congestive heart failure) ([place])   . DM2 (diabetes mellitus, type 2) ([place])   . ESRD (end stage renal disease) ([place])    HD MWF - [facility]  . HTN (hypertension)   . Hyperlipidemia   . Hyperparathyroidism, secondary renal ([place])   . Hypothyroid   . Shortness of breath   . Tibia/fibula fracture 07/10/2011   right - nonunion repair 06/2012   Past Surgical History:  Procedure Laterality Date  . ABDOMINAL SURGERY    . ANKLE FUSION Right   . ARTERIOVENOUS GRAFT PLACEMENT Right   . BREAST SURGERY Left    Mastectomy  . EYE SURGERY Bilateral    Cataract  . MASTECTOMY    . ORIF TIBIA FRACTURE Right 06/16/2012   Procedure: OPEN REDUCTION INTERNAL FIXATION (ORIF) TIBIA FRACTURE;  Surgeon: Rozanna Box, MD;  Location: [facility];  Service: Orthopedics;  Laterality: Right;  . THYROID SURGERY     Family History  Problem Relation Age of Onset  . Hyperlipidemia Father    . Diabetes Father   . Breast cancer Sister    Social History  Substance Use Topics  . Smoking status: Never Smoker  . Smokeless tobacco: Current User    Types: Snuff  . Alcohol use No   OB History    No data available     Review of Systems  Constitutional: Negative.   HENT: Negative.   Eyes: Negative.   Respiratory: Negative.   Cardiovascular: Negative.   Gastrointestinal: Negative.   Musculoskeletal: Positive for arthralgias and joint swelling.  Skin: Negative.   Neurological: Negative.   All other systems reviewed and are negative.   Allergies  Codeine; Coreg [carvedilol]; Lopressor [metoprolol tartrate]; and Metoprolol  Home Medications   Prior to Admission medications   Medication Sig Start Date End Date Taking? Authorizing Provider  aspirin 325 MG tablet Take 1 tablet (325 mg total) by mouth daily. 07/28/15   Angiulli, Lavon Paganini, PA-C  b complex-vitamin c-folic acid (NEPHRO-VITE) 0.8 MG TABS tablet Take 1 tablet by mouth at bedtime. 08/29/15   Binnie Rail, MD  darbepoetin (ARANESP) 100 MCG/0.5ML SOLN Inject 0.5 mLs (100 mcg total) into the vein every Wednesday with hemodialysis. 07/25/12   Erlene Quan, PA-C  diclofenac sodium (VOLTAREN) 1 % GEL Apply 2 g topically 3 (three) times daily. 07/28/15   Angiulli, Lavon Paganini, PA-C  glimepiride (AMARYL) 1 MG tablet TAKE 3 TABLETS(3 MG) BY MOUTH DAILY WITH  BREAKFAST 04/01/16   Burns, Claudina Lick, MD  hydrALAZINE (APRESOLINE) 50 MG tablet TAKE 1 TABLET(50 MG) BY MOUTH TWICE DAILY 01/15/16   Burns, Claudina Lick, MD  isosorbide mononitrate (IMDUR) 30 MG 24 hr tablet Take 1 tablet (30 mg total) by mouth daily. 07/28/15   Angiulli, Lavon Paganini, PA-C  levothyroxine (SYNTHROID, LEVOTHROID) 75 MCG tablet Take 1 tablet (75 mcg total) by mouth daily before breakfast. --- Office visit needed for further refills 05/15/16   Binnie Rail, MD  nebivolol (BYSTOLIC) 10 MG tablet Take 1 tablet (10 mg total) by mouth daily. 08/29/15   Binnie Rail, MD   pravastatin (PRAVACHOL) 20 MG tablet Take 1 tablet (20 mg total) by mouth daily at 6 PM. 08/29/15   Burns, Claudina Lick, MD  sevelamer carbonate (RENVELA) 800 MG tablet Take 1 tablet (800 mg total) by mouth 3 (three) times daily with meals. 08/29/15   Binnie Rail, MD  Vitamin D, Ergocalciferol, (DRISDOL) 50000 UNITS CAPS Take 50,000 Units by mouth every 7 (seven) days. Takes on Fridays.    [provider]  vitamin E 400 UNIT capsule Take 400 Units by mouth daily.    [provider]   Meds Ordered and Administered this Visit   Medications  ondansetron (ZOFRAN-ODT) disintegrating tablet 8 mg (8 mg Oral Given 08/13/16 1335)    BP (!) 155/76 (BP Location: Other (Comment)) Comment: notified rn Comment (BP Location): left ankle  Pulse 60   Temp 98.3 F (36.8 C) (Oral)   Resp 16   SpO2 100%  No data found.   Physical Exam  Constitutional: She is oriented to person, place, and time. She appears well-developed and well-nourished. No distress.  HENT:  Head: Normocephalic and atraumatic.  Eyes: Conjunctivae and EOM are normal.  Neck: Normal range of motion. Neck supple.  Cardiovascular: Normal rate and regular rhythm.   Pulmonary/Chest: Effort normal and breath sounds normal.  Abdominal: Soft. Bowel sounds are normal.  Musculoskeletal: She exhibits edema and tenderness.       Right knee: She exhibits decreased range of motion and swelling. Tenderness found.  Neurological: She is alert and oriented to person, place, and time.  Skin: She is not diaphoretic.  Nursing note and vitals reviewed.   Urgent Care Course   Clinical Course as of Aug 14 1343  Tue Aug 13, 2016  1320 DG Knee Complete 4 Views Right [LK]    Clinical Course User Index [LK] Barnet Glasgow, NP    Procedures (including critical care time)  Labs Review Labs Reviewed - No data to display  Imaging Review Dg Knee Complete 4 Views Right  Result Date: 08/13/2016 CLINICAL DATA:  Fall.  History of the  surgery. EXAM: RIGHT KNEE - COMPLETE 4+ VIEW COMPARISON:  07/10/2011. FINDINGS: Prior ORIF right tibia. An acute fracture of the proximal tibia appears to be present. Acute fracture of the distal femoral shaft is present. Fractures are slightly separated. IMPRESSION: 1.  Acute fractures of the distal femur. 2. Prior ORIF right tibia. Superimposed acute fracture appears to be present. Both femoral and tibial fractures are slightly separated. Electronically Signed   By: Marcello Moores  Register   On: 08/13/2016 13:27    MDM   1. Physeal fracture of distal end of right femur, unspecified physeal fracture configuration, initial encounter ([place])     Acute femur, and tibia fracture of the right leg. Pain medicine was offered in clinic, however it was declined by the family. Patient discharged to the emergency  room for further evaluation of this injury. It was strongly encouraged that she go by ambulance, however patient's family adamantly refused EMS transportation, [place].     Barnet Glasgow, NP 08/13/16 1345

## 2016-08-13 NOTE — ED Triage Notes (Signed)
Pt was walking out her door, slipped and fell on her right knee. She has it bandaged and an ice pack in place. Pt states she is unable to bear any weight on the leg. Pt denies LOC. She had surgery on the knee last year.

## 2016-08-14 ENCOUNTER — Inpatient Hospital Stay (HOSPITAL_COMMUNITY): Payer: Medicare Other

## 2016-08-14 DIAGNOSIS — S72324A Nondisplaced transverse fracture of shaft of right femur, initial encounter for closed fracture: Secondary | ICD-10-CM

## 2016-08-14 DIAGNOSIS — S8291XA Unspecified fracture of right lower leg, initial encounter for closed fracture: Secondary | ICD-10-CM

## 2016-08-14 DIAGNOSIS — N184 Chronic kidney disease, stage 4 (severe): Secondary | ICD-10-CM

## 2016-08-14 DIAGNOSIS — N186 End stage renal disease: Secondary | ICD-10-CM

## 2016-08-14 DIAGNOSIS — Z992 Dependence on renal dialysis: Secondary | ICD-10-CM

## 2016-08-14 DIAGNOSIS — I10 Essential (primary) hypertension: Secondary | ICD-10-CM

## 2016-08-14 DIAGNOSIS — I480 Paroxysmal atrial fibrillation: Secondary | ICD-10-CM

## 2016-08-14 DIAGNOSIS — D631 Anemia in chronic kidney disease: Secondary | ICD-10-CM

## 2016-08-14 DIAGNOSIS — E1122 Type 2 diabetes mellitus with diabetic chronic kidney disease: Secondary | ICD-10-CM

## 2016-08-14 DIAGNOSIS — I5042 Chronic combined systolic (congestive) and diastolic (congestive) heart failure: Secondary | ICD-10-CM

## 2016-08-14 LAB — RENAL FUNCTION PANEL
Albumin: 2.3 g/dL — ABNORMAL LOW (ref 3.5–5.0)
Anion gap: 13 (ref 5–15)
BUN: 29 mg/dL — ABNORMAL HIGH (ref 6–20)
CALCIUM: 7.8 mg/dL — AB (ref 8.9–10.3)
CO2: 25 mmol/L (ref 22–32)
CREATININE: 6.87 mg/dL — AB (ref 0.44–1.00)
Chloride: 102 mmol/L (ref 101–111)
GFR, EST AFRICAN AMERICAN: 6 mL/min — AB (ref >60)
GFR, EST NON AFRICAN AMERICAN: 5 mL/min — AB (ref >60)
Glucose, Bld: 222 mg/dL — ABNORMAL HIGH (ref 65–99)
PHOSPHORUS: 5.2 mg/dL — AB (ref 2.5–4.6)
Potassium: 4.7 mmol/L (ref 3.5–5.1)
SODIUM: 140 mmol/L (ref 135–145)

## 2016-08-14 LAB — CBC
HCT: 26 % — ABNORMAL LOW (ref 36.0–46.0)
HEMOGLOBIN: 8.2 g/dL — AB (ref 12.0–15.0)
MCH: 29.6 pg (ref 26.0–34.0)
MCHC: 31.5 g/dL (ref 30.0–36.0)
MCV: 93.9 fL (ref 78.0–100.0)
PLATELETS: 334 10*3/uL (ref 150–400)
RBC: 2.77 MIL/uL — AB (ref 3.87–5.11)
RDW: 16.9 % — ABNORMAL HIGH (ref 11.5–15.5)
WBC: 9.2 10*3/uL (ref 4.0–10.5)

## 2016-08-14 LAB — GLUCOSE, CAPILLARY
GLUCOSE-CAPILLARY: 209 mg/dL — AB (ref 65–99)
Glucose-Capillary: 89 mg/dL (ref 65–99)

## 2016-08-14 MED ORDER — SODIUM CHLORIDE 0.9 % IV SOLN: 100.0000 mL | INTRAVENOUS | Status: DC | PRN

## 2016-08-14 MED ORDER — CALCITRIOL 0.5 MCG PO CAPS
0.5000 ug | ORAL_CAPSULE | ORAL | Status: DC
  Administered 2016-08-16 (×2): 0.5 ug via ORAL
  Filled 2016-08-14: qty 1

## 2016-08-14 MED ORDER — LIDOCAINE HCL (PF) 1 % IJ SOLN: 5.0000 mL | INTRAMUSCULAR | Status: DC | PRN

## 2016-08-14 MED ORDER — SODIUM CHLORIDE 0.9 % IV SOLN
100.0000 mL | INTRAVENOUS | Status: DC | PRN
  Administered 2016-08-15: 09:00:00 via INTRAVENOUS

## 2016-08-14 MED ORDER — CEFAZOLIN SODIUM-DEXTROSE 1-4 GM/50ML-% IV SOLN
1.0000 g | Freq: Once | INTRAVENOUS | Status: AC
  Administered 2016-08-14: 1 g via INTRAVENOUS
  Filled 2016-08-14: qty 50

## 2016-08-14 MED ORDER — FENTANYL CITRATE (PF) 100 MCG/2ML IJ SOLN
INTRAMUSCULAR | Status: AC
  Administered 2016-08-14: 12.5 ug
  Filled 2016-08-14: qty 2

## 2016-08-14 MED ORDER — HEPARIN SODIUM (PORCINE) 1000 UNIT/ML DIALYSIS: 1000.0000 [IU] | INTRAMUSCULAR | Status: DC | PRN

## 2016-08-14 MED ORDER — ALTEPLASE 2 MG IJ SOLR: 2.0000 mg | Freq: Once | INTRAMUSCULAR | Status: DC | PRN

## 2016-08-14 MED ORDER — LACTATED RINGERS IV SOLN
INTRAVENOUS | Status: DC
  Administered 2016-08-14: 23:00:00 via INTRAVENOUS

## 2016-08-14 MED ORDER — POVIDONE-IODINE 7.5 % EX SOLN
Freq: Once | CUTANEOUS | Status: DC
  Filled 2016-08-14: qty 118

## 2016-08-14 MED ORDER — LIDOCAINE-PRILOCAINE 2.5-2.5 % EX CREA: 1.0000 "application " | TOPICAL_CREAM | CUTANEOUS | Status: DC | PRN

## 2016-08-14 MED ORDER — PRO-STAT SUGAR FREE PO LIQD
30.0000 mL | Freq: Two times a day (BID) | ORAL | Status: DC
  Administered 2016-08-14 – 2016-08-17 (×4): 30 mL via ORAL
  Filled 2016-08-14 (×5): qty 30

## 2016-08-14 MED ORDER — PENTAFLUOROPROP-TETRAFLUOROETH EX AERO: 1.0000 "application " | INHALATION_SPRAY | CUTANEOUS | Status: DC | PRN

## 2016-08-14 MED ORDER — ACETAMINOPHEN 500 MG PO TABS
1000.0000 mg | ORAL_TABLET | Freq: Once | ORAL | Status: AC
  Administered 2016-08-14: 1000 mg via ORAL
  Filled 2016-08-14: qty 2

## 2016-08-14 MED ORDER — SEVELAMER CARBONATE 800 MG PO TABS
1600.0000 mg | ORAL_TABLET | Freq: Three times a day (TID) | ORAL | Status: DC
  Administered 2016-08-16 (×2): 1600 mg via ORAL
  Filled 2016-08-14 (×2): qty 2

## 2016-08-14 MED ORDER — CEFAZOLIN SODIUM-DEXTROSE 2-4 GM/100ML-% IV SOLN
2.0000 g | INTRAVENOUS | Status: AC
  Administered 2016-08-15: 2 g via INTRAVENOUS
  Filled 2016-08-14 (×2): qty 100

## 2016-08-14 NOTE — Progress Notes (Signed)
CM spoke to patients daughter "[name]" about the plan at discharge. Per [name] the patient will need to go to rehab before returning home. [name] is in agreement with the patient being faxed out in the [facility] area. CSW notified. [name] would also like a rollator prior to d/c. CM continuing to follow.

## 2016-08-14 NOTE — Care Management Note (Signed)
Case Management Note  Patient Details  Name: [name] MRN: [id number] Date of Birth: 03/15/1929  Subjective/Objective:     Pt admitted with leg fracture. She is from home with her daughter. Pt has walker at home.               Action/Plan: Plan is for OR tomorrow. CM following for d/c needs post surgery.  Expected Discharge Date:                  Expected Discharge Plan:     In-House Referral:     Discharge planning Services     Post Acute Care Choice:    Choice offered to:     DME Arranged:    DME Agency:     HH Arranged:    HH Agency:     Status of Service:  In process, will continue to follow  If discussed at Long Length of Stay Meetings, dates discussed:    Additional Comments:  Pollie Friar, RN 08/14/2016, 11:57 AM

## 2016-08-14 NOTE — Progress Notes (Addendum)
Initial Nutrition Assessment  DOCUMENTATION CODES:   Not applicable  INTERVENTION:    Continue Nepro Shake po BID, each supplement provides 425 kcal and 19 grams protein   Continue Prostat liquid protein po 30 ml BID with meals, each supplement provides 100 kcal, 15 grams protein  NUTRITION DIAGNOSIS:   Increased nutrient needs related to chronic illness, wound healing as evidenced by estimated needs  GOAL:   Patient will meet greater than or equal to 90% of their needs  MONITOR:   PO intake, Supplement acceptance, Labs, Weight trends, I & O's  REASON FOR ASSESSMENT:   Consult Assessment of nutrition requirement/status  ASSESSMENT:   83 y.o. Female with ESRD on HD, DM, HTN, chronic combined CHF, Afib not on anticoagulation, h/o breast cancer. She is admitted with right distal femur and proximal tibia fractures. She sustained the fracture after falling at home.  Pt currently in [name]. Evaluated by Ortho who are planning surgical repair tomorrow. Dialysis at [facility] MWF.  Last HD Monday 5/28.  Medications reviewed and include Rena-Vit, levothyroxine, Renvela and ABX. Labs reviewed.  CBG's 197-89.  Unable to complete Nutrition-Focused physical exam at this time.   Diet Order:  Diet renal/carb modified with fluid restriction Diet-HS Snack? Nothing; Room service appropriate? Yes; Fluid consistency: Thin  Skin:  Wound (see comment) (R knee wound)  Last BM:  5/28  Height:   Ht Readings from Last 1 Encounters:  08/13/16 5\' 1"  (1.549 m)   Weight:   Wt Readings from Last 1 Encounters:  08/14/16 128 lb 15.5 oz (58.5 kg)   Ideal Body Weight:  47.7 kg  BMI:  Body mass index is 24.37 kg/m.  Estimated Nutritional Needs:   Kcal:  1300-1500  Protein:  60-70 gm  Fluid:  >/= 1.5 L  EDUCATION NEEDS:   No education needs identified at this time  Arthur Holms, RD, LDN Pager #: [phone] After-Hours Pager #: [phone]

## 2016-08-14 NOTE — Consult Note (Signed)
[facility] Renal Consultation Note    Indication for Consultation:  Management of ESRD/hemodialysis; anemia, hypertension/volume and secondary hyperparathyroidism  HPI: [name] is a 83 y.o. female with ESRD on HD, DM, HTN, chronic combined CHF, Afib not on anticoagulation, h/o breast cancer. She is admitted with right distal femur and proximal tibia fractures. She sustained the fracture after falling at home yesterday.  Has been evaluated by ortho who are planning surgical repair tomorrow. She is s/p right tib/fib fracture 2014 with ORIF. Treated at [facility] in 2017 for right knee infection with skin grafting and has had some problems poor wound healing.   Seen on HD and has no complaints currently. Seems reluctant to discuss what happened, but denies dizziness or syncope prior to fall. Says her foot slipped while using her walker. Denies fever, chills, CP, SOB, N/V/D.   Dialyzes at [facility] MWF. Last HD Monday 5/28. No recent missed treatments. BP controlled and meeting target weight.   Past Medical History:  Diagnosis Date  . Arthritis   . Atrial fibrillation ([place]) 06/25/2012   2D Echo - EF 45-50, left atrium severely dilated, mitral valve heavily calcified annulus with calcification of the subvalvular apparatus - no longer on Coumadin  . Breast cancer ([place])   . CHF (congestive heart failure) ([place])   . DM2 (diabetes mellitus, type 2) ([place])   . ESRD (end stage renal disease) ([place])    HD MWF - [facility]  . HTN (hypertension)   . Hyperlipidemia   . Hyperparathyroidism, secondary renal ([place])   . Hypothyroid   . Shortness of breath   . Tibia/fibula fracture 07/10/2011   right - nonunion repair 06/2012   Past Surgical History:  Procedure Laterality Date  . ABDOMINAL SURGERY    . ANKLE FUSION Right   . ARTERIOVENOUS GRAFT PLACEMENT Right   . BREAST SURGERY Left    Mastectomy  . EYE SURGERY Bilateral    Cataract  . MASTECTOMY    . ORIF TIBIA  FRACTURE Right 06/16/2012   Procedure: OPEN REDUCTION INTERNAL FIXATION (ORIF) TIBIA FRACTURE;  Surgeon: Rozanna Box, MD;  Location: [place];  Service: Orthopedics;  Laterality: Right;  . THYROID SURGERY     Family History  Problem Relation Age of Onset  . Hyperlipidemia Father   . Diabetes Father   . Breast cancer Sister    Social History:  reports that she has never smoked. Her smokeless tobacco use includes Snuff. She reports that she does not drink alcohol or use drugs. Allergies  Allergen Reactions  . Codeine Shortness Of Breath, Swelling and Other (See Comments)    Swelling of the tongue. Pt tolerates Vicodin.  . Coreg [Carvedilol] Other (See Comments)    Headaches   . Lopressor [Metoprolol Tartrate] Other (See Comments)    GI upset  . Metoprolol Other (See Comments)    GI upset  . Oxycodone Other (See Comments)    Hallucinations (also with Oxycontin)   Prior to Admission medications   Medication Sig Start Date End Date Taking? Authorizing Provider  aspirin EC 81 MG tablet Take 81 mg by mouth daily.   Yes [provider]  b complex-vitamin c-folic acid (NEPHRO-VITE) 0.8 MG TABS tablet Take 1 tablet by mouth at bedtime. 08/29/15  Yes Burns, Claudina Lick, MD  Cholecalciferol (VITAMIN D-3 PO) Take 1 capsule by mouth daily.   Yes [provider]  darbepoetin (ARANESP) 100 MCG/0.5ML SOLN Inject 0.5 mLs (100 mcg total) into the vein  every Wednesday with hemodialysis. 07/25/12  Yes Kilroy, Luke K, PA-C  glimepiride (AMARYL) 1 MG tablet TAKE 3 TABLETS(3 MG) BY MOUTH DAILY WITH BREAKFAST Patient taking differently: Take 1 mg by mouth once a day 04/01/16  Yes Burns, Claudina Lick, MD  hydrALAZINE (APRESOLINE) 50 MG tablet TAKE 1 TABLET(50 MG) BY MOUTH TWICE DAILY Patient taking differently: Take 50 mg by mouth once a day 01/15/16  Yes Burns, Claudina Lick, MD  isosorbide mononitrate (IMDUR) 30 MG 24 hr tablet Take 1 tablet (30 mg total) by mouth daily. 07/28/15  Yes Angiulli, Lavon Paganini,  PA-C  levothyroxine (SYNTHROID, LEVOTHROID) 75 MCG tablet Take 1 tablet (75 mcg total) by mouth daily before breakfast. --- Office visit needed for further refills Patient taking differently: Take 75 mcg by mouth daily before breakfast.  05/15/16  Yes Burns, Claudina Lick, MD  nebivolol (BYSTOLIC) 10 MG tablet Take 1 tablet (10 mg total) by mouth daily. 08/29/15  Yes Burns, Claudina Lick, MD  pravastatin (PRAVACHOL) 20 MG tablet Take 1 tablet (20 mg total) by mouth daily at 6 PM. 08/29/15  Yes Burns, Claudina Lick, MD  sevelamer carbonate (RENVELA) 800 MG tablet Take 1 tablet (800 mg total) by mouth 3 (three) times daily with meals. 08/29/15  Yes Burns, Claudina Lick, MD  vitamin E 200 UNIT capsule Take 200 Units by mouth daily.   Yes [provider]  diclofenac sodium (VOLTAREN) 1 % GEL Apply 2 g topically 3 (three) times daily. Patient not taking: Reported on 08/13/2016 07/28/15   Cathlyn Parsons, PA-C   Current Facility-Administered Medications  Medication Dose Route Frequency Provider Last Rate Last Dose  . 0.9 %  sodium chloride infusion  100 mL Intravenous PRN Ejigiri, Thomos Lemons, PA-C      . 0.9 %  sodium chloride infusion  100 mL Intravenous PRN Lynnda Child, PA-C      . alteplase (CATHFLO ACTIVASE) injection 2 mg  2 mg Intracatheter Once PRN Lynnda Child, PA-C      . aspirin tablet 325 mg  325 mg Oral Daily Karmen Bongo, MD   325 mg at 08/14/16 1057  . calcium carbonate (dosed in mg elemental calcium) suspension 500 mg of elemental calcium  500 mg of elemental calcium Oral Q6H PRN Karmen Bongo, MD      . camphor-menthol [facility]) lotion 1 application  1 application Topical H5K PRN Karmen Bongo, MD       And  . hydrOXYzine (ATARAX/VISTARIL) tablet 25 mg  25 mg Oral Q8H PRN Karmen Bongo, MD      . docusate sodium [facility]) enema 283 mg  1 enema Rectal PRN Karmen Bongo, MD      . feeding supplement (NEPRO CARB STEADY) liquid 237 mL  237 mL Oral TID PRN Karmen Bongo, MD       . fentaNYL (SUBLIMAZE) injection 12.5 mcg  12.5 mcg Intravenous Q2H PRN Karmen Bongo, MD   12.5 mcg at 08/14/16 1107  . heparin injection 1,000 Units  1,000 Units Dialysis PRN Lynnda Child, PA-C      . hydrALAZINE (APRESOLINE) tablet 50 mg  50 mg Oral BID Karmen Bongo, MD   50 mg at 08/14/16 1057  . HYDROcodone-acetaminophen (NORCO/VICODIN) 5-325 MG per tablet 1-2 tablet  1-2 tablet Oral Q6H PRN Karmen Bongo, MD      . insulin aspart (novoLOG) injection 0-5 Units  0-5 Units Subcutaneous QHS Karmen Bongo, MD      . insulin aspart (novoLOG) injection 0-9 Units  0-9 Units Subcutaneous TID WC Karmen Bongo, MD      . isosorbide mononitrate (IMDUR) 24 hr tablet 30 mg  30 mg Oral Daily Karmen Bongo, MD   30 mg at 08/14/16 1057  . levothyroxine (SYNTHROID, LEVOTHROID) tablet 75 mcg  75 mcg Oral QAC breakfast Karmen Bongo, MD      . lidocaine (PF) (XYLOCAINE) 1 % injection 5 mL  5 mL Intradermal PRN Lynnda Child, PA-C      . lidocaine-prilocaine (EMLA) cream 1 application  1 application Topical PRN Ejigiri, Thomos Lemons, PA-C      . methocarbamol (ROBAXIN) tablet 500 mg  500 mg Oral Q6H PRN Karmen Bongo, MD       Or  . methocarbamol (ROBAXIN) 500 mg in dextrose 5 % 50 mL IVPB  500 mg Intravenous Q6H PRN Karmen Bongo, MD      . multivitamin (RENA-VIT) tablet 1 tablet  1 tablet Oral Ivery Quale, MD   1 tablet at 08/14/16 0050  . nebivolol (BYSTOLIC) tablet 10 mg  10 mg Oral Daily Karmen Bongo, MD   10 mg at 08/14/16 1057  . pentafluoroprop-tetrafluoroeth (GEBAUERS) aerosol 1 application  1 application Topical PRN Ejigiri, Thomos Lemons, PA-C      . pravastatin (PRAVACHOL) tablet 20 mg  20 mg Oral q1800 Karmen Bongo, MD      . sevelamer carbonate (RENVELA) tablet 800 mg  800 mg Oral TID WC Karmen Bongo, MD      . sorbitol 70 % solution 30 mL  30 mL Oral PRN Karmen Bongo, MD      . zolpidem Lorrin Mais) tablet 5 mg  5 mg Oral QHS PRN Karmen Bongo, MD        ROS: As per HPI otherwise negative.  Physical Exam: Vitals:   08/13/16 2301 08/14/16 0423 08/14/16 0944 08/14/16 1300  BP: (!) 139/36 (!) 114/24 (!) 150/35 (!) (P) 126/40  Pulse: 65 (!) 58 (!) 59 (P) 60  Resp: 16 16 18  (P) 18  Temp: 98.1 F (36.7 C) 98.7 F (37.1 C) 98.3 F (36.8 C) (P) 98.1 F (36.7 C)  TempSrc: Oral Oral Oral (P) Oral  SpO2: 99% 98% 98% (P) 95%  Weight: 59.5 kg (131 lb 1.6 oz)   (P) 58.5 kg (128 lb 15.5 oz)  Height: 5\' 1"  (1.549 m)        General: WDWN elderly AAF female on HD NAD  Head: NCAT sclera not icteric MMM Neck: Supple. No JVD No masses Lungs: CTA bilaterally without wheezes, rales, or rhonchi. Breathing is unlabored. Heart: RRR with S1 S2 Abdomen: soft NT + BS Lower extremities: R knee swollen with some purulent drainage chronic skin changes/wounds to knee and ankle Neuro: A & O  X 3. Moves all extremities spontaneously.  Psych:  Responds to questions appropriately with a normal affect. Dialysis Access: RUE AVF cannulated on HD   Labs: Basic Metabolic Panel:  Recent Labs Lab 08/13/16 1639 08/14/16 1300  NA 141 140  K 4.3 4.7  CL 99* 102  CO2 29 25  GLUCOSE 222* 222*  BUN 23* 29*  CREATININE 5.50* 6.87*  CALCIUM 8.6* 7.8*  PHOS  --  5.2*   Liver Function Tests:  Recent Labs Lab 08/14/16 1300  ALBUMIN 2.3*   No results for input(s): LIPASE, AMYLASE in the last 168 hours. No results for input(s): AMMONIA in the last 168 hours. CBC:  Recent Labs Lab 08/13/16 1639 08/14/16 1006  WBC 9.9 9.2  NEUTROABS 7.8*  --  HGB 8.9* 8.2*  HCT 28.5* 26.0*  MCV 94.7 93.9  PLT 381 334   Cardiac Enzymes: No results for input(s): CKTOTAL, CKMB, CKMBINDEX, TROPONINI in the last 168 hours. CBG:  Recent Labs Lab 08/13/16 2331 08/14/16 0756  GLUCAP 197* 89   Iron Studies: No results for input(s): IRON, TIBC, TRANSFERRIN, FERRITIN in the last 72 hours. Studies/Results: Dg Pelvis 1-2 Views  Result Date:  08/14/2016 CLINICAL DATA:  Fall yesterday with right hip pain, initial encounter EXAM: PELVIS - 1-2 VIEW COMPARISON:  None. FINDINGS: Pelvic ring is intact. Degenerative changes of the hip joints are noted bilaterally. No soft tissue abnormality is seen. Calcified uterine fibroids noted. IMPRESSION: No acute abnormality seen. Electronically Signed   By: Inez Catalina M.D.   On: 08/14/2016 09:45   Chest Portable 1 View  Result Date: 08/14/2016 CLINICAL DATA:  83 year old female with leg fracture. Preop evaluation. EXAM: PORTABLE CHEST 1 VIEW COMPARISON:  Chest CT dated 12/02/2013 FINDINGS: The lungs are clear. There is no pleural effusion or pneumothorax. Top-normal cardiac size. There is atherosclerotic calcification of the thoracic aorta. No acute osseous pathology. Right rotator cuff surgery and humeral head anchor pins. Partially visualized vascular stent in the right axilla. IMPRESSION: No active disease. Electronically Signed   By: Anner Crete M.D.   On: 08/14/2016 02:17   Dg Knee Complete 4 Views Right  Result Date: 08/13/2016 CLINICAL DATA:  Fall.  History of the surgery. EXAM: RIGHT KNEE - COMPLETE 4+ VIEW COMPARISON:  07/10/2011. FINDINGS: Prior ORIF right tibia. An acute fracture of the proximal tibia appears to be present. Acute fracture of the distal femoral shaft is present. Fractures are slightly separated. IMPRESSION: 1.  Acute fractures of the distal femur. 2. Prior ORIF right tibia. Superimposed acute fracture appears to be present. Both femoral and tibial fractures are slightly separated. Electronically Signed   By: Marcello Moores  Register   On: 08/13/2016 13:27    Dialysis Orders:  [facility] MWF 3.5h 180F BFR 350/800 3K/2.5 Ca Profile 4 EDW 60 kg R AVF No heparin  Calcitriol 0.5 mcg PO q HD Mircera 75 mcg IV q 2 weeks (last 5/23) Venofer 100 mg IV q HD until 6/6    Assessment/Plan: 1. R knee fractures - fall with fracture of proximal tibia/distal femur - prior tib/fib fracture in 2014  - per primary/ortho following for surgery tomorrow ABx per primary  2.  ESRD -  MWF - For HD today on schedule  3.  Hypertension/volume  - BP controlled cont home meds/volume stabld 4.  Anemia  - Hgb 8.2 - follow anticipate drop post op - transfuse hgb <7  5.  Metabolic bone disease -  Cont VDRA/Renvela binder  6.  Nutrition - Renal diet/prostat for low albumin  7. DM - per primary   Lynnda Child PA-C [place] Pager [phone] 08/14/2016, 1:56 PM    Renal Attending: I agree with note as articulated above. For orthopedic surgery in AM and HD today. Emelie Newsom C

## 2016-08-14 NOTE — NC FL2 (Signed)
[place] MEDICAID FL2 LEVEL OF CARE SCREENING TOOL     IDENTIFICATION  Patient Name: [name] Birthdate: Aug 13, 1928 Sex: female Admission Date (Current Location): 08/13/2016  [facility] and [place] Number:  Herbalist and Address:  [facility] [place], [place] [address] [place], [place] [address]      Provider Number: [id number]  Attending Physician Name and Address:  Mariel Aloe, MD  Relative Name and Phone Number:       Current Level of Care: Hospital Recommended Level of Care: [place] Prior Approval Number:    Date Approved/Denied:   PASRR Number: [id number]  Discharge Plan: SNF    Current Diagnoses: Patient Active Problem List   Diagnosis Date Noted  . Leg fracture 08/13/2016  . Hypothyroidism 08/30/2015  . Labile blood glucose   . Memory loss   . Hip fracture ([place]) 07/12/2015  . Swelling   . ESRD on dialysis ([place])   . PAF (paroxysmal atrial fibrillation) ([place])   . Chronic combined systolic and diastolic heart failure EF 45-50% by echo 06/25/2012 07/30/2012  . Breast cancer, s/p Lt mastectomy 07/22/2012  . Pleural effusion due to CHF (congestive heart failure) ([place]) 07/20/2012  . Hypotension, unspecified 06/25/2012  . Right tibial fracture 06/21/2012  . Metabolic bone disease 62/69/4854  . Secondary hyperparathyroidism of renal origin ([place]) 06/19/2012  . Nonunion of fracture, R tibia 06/17/2012  . H/O radioactive iodine thyroid ablation   . Closed fracture of right fibula and tibia 07/10/2011  . HTN (hypertension) 07/10/2011  . DM2 (diabetes mellitus, type 2) ([place]) 07/10/2011  . Hyperlipidemia 07/10/2011  . Anemia 07/10/2011    Orientation RESPIRATION BLADDER Height & Weight     Self, Time, Situation, Place  Normal Continent Weight: 128 lb 15.5 oz (58.5 kg) Height:  5\' 1"  (154.9 cm)  BEHAVIORAL SYMPTOMS/MOOD NEUROLOGICAL BOWEL NUTRITION STATUS      Continent    AMBULATORY STATUS COMMUNICATION OF  NEEDS Skin   Extensive Assist Verbally Normal                       Personal Care Assistance Level of Assistance  Bathing, Dressing Bathing Assistance: Limited assistance   Dressing Assistance: Limited assistance     Functional Limitations Info             SPECIAL CARE FACTORS FREQUENCY  PT (By licensed PT), OT (By licensed OT)     PT Frequency: will assess OT Frequency: will assess            Contractures      Additional Factors Info  Code Status, Allergies Code Status Info: full Allergies Info: Codeine, Coreg Carvedilol, Lopressor Metoprolol Tartrate, Metoprolol, Oxycodone           Current Medications (08/14/2016):  This is the current hospital active medication list Current Facility-Administered Medications  Medication Dose Route Frequency Provider Last Rate Last Dose  . 0.9 %  sodium chloride infusion  100 mL Intravenous PRN Ejigiri, Thomos Lemons, PA-C      . 0.9 %  sodium chloride infusion  100 mL Intravenous PRN Lynnda Child, PA-C      . alteplase (CATHFLO ACTIVASE) injection 2 mg  2 mg Intracatheter Once PRN Lynnda Child, PA-C      . aspirin tablet 325 mg  325 mg Oral Daily Karmen Bongo, MD   325 mg at 08/14/16 1057  . [START ON 08/16/2016] calcitRIOL (ROCALTROL) capsule 0.5 mcg  0.5  mcg Oral Q M,W,F-HD Lynnda Child, PA-C      . calcium carbonate (dosed in mg elemental calcium) suspension 500 mg of elemental calcium  500 mg of elemental calcium Oral Q6H PRN Karmen Bongo, MD      . camphor-menthol [facility]) lotion 1 application  1 application Topical T0V PRN Karmen Bongo, MD       And  . hydrOXYzine (ATARAX/VISTARIL) tablet 25 mg  25 mg Oral Q8H PRN Karmen Bongo, MD      . docusate sodium [facility]) enema 283 mg  1 enema Rectal PRN Karmen Bongo, MD      . feeding supplement (NEPRO CARB STEADY) liquid 237 mL  237 mL Oral TID PRN Karmen Bongo, MD      . feeding supplement (PRO-STAT SUGAR FREE 64) liquid 30 mL  30 mL  Oral BID Ejigiri, Ogechi Grace, PA-C      . fentaNYL (SUBLIMAZE) injection 12.5 mcg  12.5 mcg Intravenous Q2H PRN Karmen Bongo, MD   12.5 mcg at 08/14/16 1107  . heparin injection 1,000 Units  1,000 Units Dialysis PRN Lynnda Child, PA-C      . hydrALAZINE (APRESOLINE) tablet 50 mg  50 mg Oral BID Karmen Bongo, MD   50 mg at 08/14/16 1057  . HYDROcodone-acetaminophen (NORCO/VICODIN) 5-325 MG per tablet 1-2 tablet  1-2 tablet Oral Q6H PRN Karmen Bongo, MD      . insulin aspart (novoLOG) injection 0-5 Units  0-5 Units Subcutaneous QHS Karmen Bongo, MD      . insulin aspart (novoLOG) injection 0-9 Units  0-9 Units Subcutaneous TID WC Karmen Bongo, MD      . isosorbide mononitrate (IMDUR) 24 hr tablet 30 mg  30 mg Oral Daily Karmen Bongo, MD   30 mg at 08/14/16 1057  . levothyroxine (SYNTHROID, LEVOTHROID) tablet 75 mcg  75 mcg Oral QAC breakfast Karmen Bongo, MD      . lidocaine (PF) (XYLOCAINE) 1 % injection 5 mL  5 mL Intradermal PRN Lynnda Child, PA-C      . lidocaine-prilocaine (EMLA) cream 1 application  1 application Topical PRN Ejigiri, Thomos Lemons, PA-C      . methocarbamol (ROBAXIN) tablet 500 mg  500 mg Oral Q6H PRN Karmen Bongo, MD       Or  . methocarbamol (ROBAXIN) 500 mg in dextrose 5 % 50 mL IVPB  500 mg Intravenous Q6H PRN Karmen Bongo, MD      . multivitamin (RENA-VIT) tablet 1 tablet  1 tablet Oral Ivery Quale, MD   1 tablet at 08/14/16 0050  . nebivolol (BYSTOLIC) tablet 10 mg  10 mg Oral Daily Karmen Bongo, MD   10 mg at 08/14/16 1057  . pentafluoroprop-tetrafluoroeth (GEBAUERS) aerosol 1 application  1 application Topical PRN Ejigiri, Thomos Lemons, PA-C      . pravastatin (PRAVACHOL) tablet 20 mg  20 mg Oral q1800 Karmen Bongo, MD      . sevelamer carbonate (RENVELA) tablet 1,600 mg  1,600 mg Oral TID WC Ejigiri, Thomos Lemons, PA-C      . sorbitol 70 % solution 30 mL  30 mL Oral PRN Karmen Bongo, MD      . zolpidem  Lorrin Mais) tablet 5 mg  5 mg Oral QHS PRN Karmen Bongo, MD         Discharge Medications: Please see discharge summary for a list of discharge medications.  Relevant Imaging Results:  Relevant Lab Results:   Additional Information SS#: [id number]  Geralynn Ochs,  LCSW

## 2016-08-14 NOTE — Consult Note (Signed)
ORTHOPAEDIC CONSULTATION  REQUESTING PHYSICIAN: Mariel Aloe, MD  Chief Complaint: Right knee pain.  Assessment / Plan: Principal Problem:   Leg fracture Active Problems:   HTN (hypertension)   DM2 (diabetes mellitus, type 2) (HCC)   Anemia   Chronic combined systolic and diastolic heart failure EF 45-50% by echo 06/25/2012   ESRD on dialysis ([place])   PAF (paroxysmal atrial fibrillation) (HCC)  Right supracondylar femur fracture: This is in the setting of previous IM nail tibia for fracture one year ago and chronic difficult wound healing with some recent drainage and what appears to be retained suture. Stabilization of her femur is necessary and proposed surgical correction-retrograde nail is proposed.  Nothing by mouth at midnight Bedrest for now. Will update weightbearing postoperatively. Planning for operative fixation tomorrow morning by Dr. Christia Reading Murphy-08/15/2016  The risks benefits and alternatives of the procedure were discussed with the patient including but not limited to infection, bleeding, nerve injury, the need for revision surgery, blood clots, cardiopulmonary complications, morbidity, mortality, among others.  The patient verbalizes understanding and wishes to proceed.  We specifically discussed the potential of chronic wound and complications from same given difficulty healing from previous surgery.   HPI: [name] is a 83 y.o. female who complains of right knee pain after a fall onto her right leg.  She presented to the emergency department where x-rays showed supracondylar right femur fracture and possible acute fracture in the area of previous injury without significant displacement. Orthopedics was consulted for evaluation. She has a history of fracture of her tibia on this side repaired with IM nail about 1 year ago. It had healed well until about 3 months ago when she has had "bubbles come up ". She did have skin grafting which failed and  refused follow-up at [facility]. No fever or systemic symptoms.  She denies history of DVT, PE, MI, or CVA. She has CKD end-stage on dialysis Monday Wednesday Friday.  Past Medical History:  Diagnosis Date  . Arthritis   . Atrial fibrillation ([place]) 06/25/2012   2D Echo - EF 45-50, left atrium severely dilated, mitral valve heavily calcified annulus with calcification of the subvalvular apparatus - no longer on Coumadin  . Breast cancer ([place])   . CHF (congestive heart failure) ([place])   . DM2 (diabetes mellitus, type 2) ([place])   . ESRD (end stage renal disease) ([place])    HD MWF - [facility]  . HTN (hypertension)   . Hyperlipidemia   . Hyperparathyroidism, secondary renal ([place])   . Hypothyroid   . Shortness of breath   . Tibia/fibula fracture 07/10/2011   right - nonunion repair 06/2012   Past Surgical History:  Procedure Laterality Date  . ABDOMINAL SURGERY    . ANKLE FUSION Right   . ARTERIOVENOUS GRAFT PLACEMENT Right   . BREAST SURGERY Left    Mastectomy  . EYE SURGERY Bilateral    Cataract  . MASTECTOMY    . ORIF TIBIA FRACTURE Right 06/16/2012   Procedure: OPEN REDUCTION INTERNAL FIXATION (ORIF) TIBIA FRACTURE;  Surgeon: Rozanna Box, MD;  Location: [facility];  Service: Orthopedics;  Laterality: Right;  . THYROID SURGERY     Social History   Social History  . Marital status: Single    Spouse name: N/A  . Number of children: N/A  . Years of education: N/A   Social History Main Topics  . Smoking status: Never Smoker  . Smokeless tobacco: Current User  Types: Snuff  . Alcohol use No  . Drug use: No  . Sexual activity: No   Other Topics Concern  . None   Social History Narrative  . None   Family History  Problem Relation Age of Onset  . Hyperlipidemia Father   . Diabetes Father   . Breast cancer Sister    Allergies  Allergen Reactions  . Codeine Shortness Of Breath, Swelling and Other (See Comments)    Swelling of the tongue. Pt tolerates Vicodin.  . Coreg  [Carvedilol] Other (See Comments)    Headaches   . Lopressor [Metoprolol Tartrate] Other (See Comments)    GI upset  . Metoprolol Other (See Comments)    GI upset  . Oxycodone Other (See Comments)    Hallucinations (also with Oxycontin)   Prior to Admission medications   Medication Sig Start Date End Date Taking? Authorizing Provider  aspirin EC 81 MG tablet Take 81 mg by mouth daily.   Yes [provider]  b complex-vitamin c-folic acid (NEPHRO-VITE) 0.8 MG TABS tablet Take 1 tablet by mouth at bedtime. 08/29/15  Yes Burns, Claudina Lick, MD  Cholecalciferol (VITAMIN D-3 PO) Take 1 capsule by mouth daily.   Yes [provider]  darbepoetin (ARANESP) 100 MCG/0.5ML SOLN Inject 0.5 mLs (100 mcg total) into the vein every Wednesday with hemodialysis. 07/25/12  Yes Kilroy, Luke K, PA-C  glimepiride (AMARYL) 1 MG tablet TAKE 3 TABLETS(3 MG) BY MOUTH DAILY WITH BREAKFAST Patient taking differently: Take 1 mg by mouth once a day 04/01/16  Yes Burns, Claudina Lick, MD  hydrALAZINE (APRESOLINE) 50 MG tablet TAKE 1 TABLET(50 MG) BY MOUTH TWICE DAILY Patient taking differently: Take 50 mg by mouth once a day 01/15/16  Yes Burns, Claudina Lick, MD  isosorbide mononitrate (IMDUR) 30 MG 24 hr tablet Take 1 tablet (30 mg total) by mouth daily. 07/28/15  Yes Angiulli, Lavon Paganini, PA-C  levothyroxine (SYNTHROID, LEVOTHROID) 75 MCG tablet Take 1 tablet (75 mcg total) by mouth daily before breakfast. --- Office visit needed for further refills Patient taking differently: Take 75 mcg by mouth daily before breakfast.  05/15/16  Yes Burns, Claudina Lick, MD  nebivolol (BYSTOLIC) 10 MG tablet Take 1 tablet (10 mg total) by mouth daily. 08/29/15  Yes Burns, Claudina Lick, MD  pravastatin (PRAVACHOL) 20 MG tablet Take 1 tablet (20 mg total) by mouth daily at 6 PM. 08/29/15  Yes Burns, Claudina Lick, MD  sevelamer carbonate (RENVELA) 800 MG tablet Take 1 tablet (800 mg total) by mouth 3 (three) times daily with meals. 08/29/15  Yes Burns, Claudina Lick, MD  vitamin E 200 UNIT capsule Take 200 Units by mouth daily.   Yes [provider]  diclofenac sodium (VOLTAREN) 1 % GEL Apply 2 g topically 3 (three) times daily. Patient not taking: Reported on 08/13/2016 07/28/15   Cathlyn Parsons, PA-C   Chest Portable 1 View  Result Date: 08/14/2016 CLINICAL DATA:  83 year old female with leg fracture. Preop evaluation. EXAM: PORTABLE CHEST 1 VIEW COMPARISON:  Chest CT dated 12/02/2013 FINDINGS: The lungs are clear. There is no pleural effusion or pneumothorax. Top-normal cardiac size. There is atherosclerotic calcification of the thoracic aorta. No acute osseous pathology. Right rotator cuff surgery and humeral head anchor pins. Partially visualized vascular stent in the right axilla. IMPRESSION: No active disease. Electronically Signed   By: Anner Crete M.D.   On: 08/14/2016 02:17   Dg Knee Complete 4 Views Right  Result Date: 08/13/2016 CLINICAL  DATA:  Fall.  History of the surgery. EXAM: RIGHT KNEE - COMPLETE 4+ VIEW COMPARISON:  07/10/2011. FINDINGS: Prior ORIF right tibia. An acute fracture of the proximal tibia appears to be present. Acute fracture of the distal femoral shaft is present. Fractures are slightly separated. IMPRESSION: 1.  Acute fractures of the distal femur. 2. Prior ORIF right tibia. Superimposed acute fracture appears to be present. Both femoral and tibial fractures are slightly separated. Electronically Signed   By: Marcello Moores  Register   On: 08/13/2016 13:27    Positive ROS: All other systems have been reviewed and were otherwise negative with the exception of those mentioned in the HPI and as above.  Objective: Labs cbc  Recent Labs  08/13/16 1639  WBC 9.9  HGB 8.9*  HCT 28.5*  PLT 381     Recent Labs  08/13/16 1639  NA 141  K 4.3  CL 99*  CO2 29  GLUCOSE 222*  BUN 23*  CREATININE 5.50*  CALCIUM 8.6*    Physical Exam: Vitals:   08/13/16 2301 08/14/16 0423  BP: (!) 139/36 (!) 114/24  Pulse:  65 (!) 58  Resp: 16 16  Temp: 98.1 F (36.7 C) 98.7 F (37.1 C)   General: Alert, no acute distress Mental status: Alert and Oriented x3 Neurologic: Speech Clear and organized, no gross focal findings or movement disorder appreciated. Respiratory: No cyanosis, no use of accessory musculature Cardiovascular: No pedal edema GI: Abdomen is soft and non-tender, non-distended. Skin: Warm and dry.   Extremities: Warm w/o edema Psychiatric: Patient is competent for consent with normal mood and affect  MUSCULOSKELETAL:  Right leg pain with range of motion. EHL, FHL, dorsiflexion, plantar flexion intact. Sensation intact distally. Right knee with what appears to be chronic wound extending horizontally over her mid patella-1 area with scant cloudy drainage and what appears to be retained suture or foreign body. Knee without significant effusion. No surrounding erythema or induration. Other extremities are atraumatic with painless ROM and NVI.   Prudencio Burly III PA-C 08/14/2016 8:16 AM

## 2016-08-14 NOTE — Anesthesia Preprocedure Evaluation (Addendum)
Anesthesia Evaluation  Patient identified by MRN, date of birth, ID band Patient awake    Reviewed: Allergy & Precautions, NPO status , Patient's Chart, lab work & pertinent test results  Airway Mallampati: II  TM Distance: >3 FB Neck ROM: Full    Dental  (+) Dental Advisory Given   Pulmonary shortness of breath,    breath sounds clear to auscultation       Cardiovascular hypertension, Pt. on medications and Pt. on home beta blockers +CHF   Rhythm:Regular Rate:Normal     Neuro/Psych negative neurological ROS     GI/Hepatic negative GI ROS, Neg liver ROS,   Endo/Other  diabetes, Type 2Hypothyroidism   Renal/GU ESRF and DialysisRenal disease     Musculoskeletal  (+) Arthritis ,   Abdominal   Peds  Hematology  (+) anemia ,   Anesthesia Other Findings   Reproductive/Obstetrics                            Lab Results  Component Value Date   CREATININE 6.87 (H) 08/14/2016   BUN 29 (H) 08/14/2016   NA 140 08/14/2016   K 4.7 08/14/2016   CL 102 08/14/2016   CO2 25 08/14/2016   Lab Results  Component Value Date   WBC 9.5 08/15/2016   HGB 7.8 (L) 08/15/2016   HCT 23.9 (L) 08/15/2016   MCV 95.2 08/15/2016   PLT 292 08/15/2016   Lab Results  Component Value Date   INR 0.99 07/31/2016   INR 4.3 08/13/2012   INR 1.7 07/30/2012    Anesthesia Physical Anesthesia Plan  ASA: III  Anesthesia Plan: General   Post-op Pain Management:    Induction: Intravenous  Airway Management Planned: Oral ETT  Additional Equipment:   Intra-op Plan:   Post-operative Plan: Extubation in OR  Informed Consent: I have reviewed the patients History and Physical, chart, labs and discussed the procedure including the risks, benefits and alternatives for the proposed anesthesia with the patient or authorized representative who has indicated his/her understanding and acceptance.   Dental advisory  given  Plan Discussed with: CRNA  Anesthesia Plan Comments:        Anesthesia Quick Evaluation

## 2016-08-14 NOTE — Progress Notes (Signed)
PROGRESS NOTE    [name]  KZL:[id number] DOB: 10-23-1928 DOA: 08/13/2016 PCP: Binnie Rail, MD   Brief Narrative: [name] is a 83 y.o. female with a history of diabetes mellitus, ESRD, PAF, combined CHF present with a tibia and femur fracture after a fall. Surgery scheduled for 5/31.   Assessment & Plan:   Principal Problem:   Leg fracture Active Problems:   HTN (hypertension)   DM2 (diabetes mellitus, type 2) (HCC)   Anemia   Chronic combined systolic and diastolic heart failure EF 45-50% by echo 06/25/2012   ESRD on dialysis ([place])   PAF (paroxysmal atrial fibrillation) (HCC)   Leg fracture Distal femur/proximal tibia. Pain controlled. -orthopedic surgery recommendations: ORIF 5/31 -CSW for possible placement considerations -continue fentanyl prn for analgesia prn  Essential hypertension -continue hydralazine and Bystolic  Diabetes mellitus On Amaryl as an outpatient -continue SSI  ESRD MWF -nephrology recommendations  Anemia Chronic disease secondary to ESRD. Slight drop.  Chronic combined systolic and diastolic CHF Last echo 3/90 significant for EF of 45-50%. Euvolemic  Atrial fibrillation CHA2DS2-VASc Score is 6. Was previously on coumadin which was discontinued and is currently on aspirin only. Currently in sinus rhythm -continue aspirin    DVT prophylaxis: SCDs Code Status: Full code Family Communication: Discussed with daughter over the phone Disposition Plan: SNF vs rehab   Consultants:   Orthopedic surgery, Percell Miller, MD  Procedures:   None  Antimicrobials:  None    Subjective: Patient reports some pain in her leg.  Objective: Vitals:   08/13/16 2145 08/13/16 2200 08/13/16 2301 08/14/16 0423  BP: (!) 111/48 (!) 122/43 (!) 139/36 (!) 114/24  Pulse: 66 65 65 (!) 58  Resp:   16 16  Temp:   98.1 F (36.7 C) 98.7 F (37.1 C)  TempSrc:   Oral Oral  SpO2: 98% 94% 99% 98%  Weight:   59.5 kg (131 lb 1.6 oz)     Height:   5\' 1"  (1.549 m)     Intake/Output Summary (Last 24 hours) at 08/14/16 0916 Last data filed at 08/13/16 2359  Gross per 24 hour  Intake               60 ml  Output                0 ml  Net               60 ml   Filed Weights   08/13/16 2301  Weight: 59.5 kg (131 lb 1.6 oz)    Examination:  General exam: Appears calm and comfortable Respiratory system: Clear to auscultation. Respiratory effort normal. Cardiovascular system: S1 & S2 heard, RRR. No murmurs, rubs, gallops or clicks. Gastrointestinal system: Abdomen is nondistended, soft and nontender. No organomegaly or masses felt. Normal bowel sounds heard. Central nervous system: Alert and oriented. No focal neurological deficits. Extremities: No edema. No calf tenderness. Exquisite tenderness over right proximal tibia. Minimal swelling in the area. Skin: No cyanosis. No rashes. Right thigh with scar significant for previous graft Psychiatry: Judgement and insight appear normal. Mood & affect appropriate.     Data Reviewed: I have personally reviewed following labs and imaging studies  CBC:  Recent Labs Lab 08/13/16 1639  WBC 9.9  NEUTROABS 7.8*  HGB 8.9*  HCT 28.5*  MCV 94.7  PLT 300   Basic Metabolic Panel:  Recent Labs Lab 08/13/16 1639  NA 141  K 4.3  CL 99*  CO2 29  GLUCOSE 222*  BUN 23*  CREATININE 5.50*  CALCIUM 8.6*   GFR: Estimated Creatinine Clearance: 6.2 mL/min (A) (by C-G formula based on SCr of 5.5 mg/dL (H)). Liver Function Tests: No results for input(s): AST, ALT, ALKPHOS, BILITOT, PROT, ALBUMIN in the last 168 hours. No results for input(s): LIPASE, AMYLASE in the last 168 hours. No results for input(s): AMMONIA in the last 168 hours. Coagulation Profile: No results for input(s): INR, PROTIME in the last 168 hours. Cardiac Enzymes: No results for input(s): CKTOTAL, CKMB, CKMBINDEX, TROPONINI in the last 168 hours. BNP (last 3 results) No results for input(s): PROBNP in the  last 8760 hours. HbA1C: No results for input(s): HGBA1C in the last 72 hours. CBG:  Recent Labs Lab 08/13/16 2331 08/14/16 0756  GLUCAP 197* 89   Lipid Profile: No results for input(s): CHOL, HDL, LDLCALC, TRIG, CHOLHDL, LDLDIRECT in the last 72 hours. Thyroid Function Tests: No results for input(s): TSH, T4TOTAL, FREET4, T3FREE, THYROIDAB in the last 72 hours. Anemia Panel: No results for input(s): VITAMINB12, FOLATE, FERRITIN, TIBC, IRON, RETICCTPCT in the last 72 hours. Sepsis Labs: No results for input(s): PROCALCITON, LATICACIDVEN in the last 168 hours.  No results found for this or any previous visit (from the past 240 hour(s)).       Radiology Studies: Chest Portable 1 View  Result Date: 08/14/2016 CLINICAL DATA:  83 year old female with leg fracture. Preop evaluation. EXAM: PORTABLE CHEST 1 VIEW COMPARISON:  Chest CT dated 12/02/2013 FINDINGS: The lungs are clear. There is no pleural effusion or pneumothorax. Top-normal cardiac size. There is atherosclerotic calcification of the thoracic aorta. No acute osseous pathology. Right rotator cuff surgery and humeral head anchor pins. Partially visualized vascular stent in the right axilla. IMPRESSION: No active disease. Electronically Signed   By: Anner Crete M.D.   On: 08/14/2016 02:17   Dg Knee Complete 4 Views Right  Result Date: 08/13/2016 CLINICAL DATA:  Fall.  History of the surgery. EXAM: RIGHT KNEE - COMPLETE 4+ VIEW COMPARISON:  07/10/2011. FINDINGS: Prior ORIF right tibia. An acute fracture of the proximal tibia appears to be present. Acute fracture of the distal femoral shaft is present. Fractures are slightly separated. IMPRESSION: 1.  Acute fractures of the distal femur. 2. Prior ORIF right tibia. Superimposed acute fracture appears to be present. Both femoral and tibial fractures are slightly separated. Electronically Signed   By: Marcello Moores  Register   On: 08/13/2016 13:27        Scheduled Meds: . aspirin   325 mg Oral Daily  . hydrALAZINE  50 mg Oral BID  . insulin aspart  0-5 Units Subcutaneous QHS  . insulin aspart  0-9 Units Subcutaneous TID WC  . isosorbide mononitrate  30 mg Oral Daily  . levothyroxine  75 mcg Oral QAC breakfast  . multivitamin  1 tablet Oral QHS  . nebivolol  10 mg Oral Daily  . pravastatin  20 mg Oral q1800  . sevelamer carbonate  800 mg Oral TID WC   Continuous Infusions: . methocarbamol (ROBAXIN)  IV       LOS: 1 day     Cordelia Poche, MD [facility] 08/14/2016, 9:16 AM Pager: [phone]  If 7PM-7AM, please contact night-coverage [email or web address] Password [email or web address] 08/14/2016, 9:16 AM

## 2016-08-14 NOTE — Procedures (Signed)
Tolerating HD.  Sedated some from Fentanyl, awakens and falls asleep.  No hemodynamic issues at the current time. [name]

## 2016-08-15 ENCOUNTER — Encounter (HOSPITAL_COMMUNITY): Payer: Self-pay | Admitting: Certified Registered"

## 2016-08-15 ENCOUNTER — Inpatient Hospital Stay (HOSPITAL_COMMUNITY): Payer: Medicare Other

## 2016-08-15 ENCOUNTER — Inpatient Hospital Stay (HOSPITAL_COMMUNITY): Admission: RE | Admit: 2016-08-15 | Payer: Medicare Other | Source: Ambulatory Visit | Admitting: Orthopedic Surgery

## 2016-08-15 ENCOUNTER — Encounter (HOSPITAL_COMMUNITY)
Admission: EM | Disposition: A | Discharge status: Skilled Nursing Facility | Payer: Self-pay | Source: Home / Self Care | Attending: Family Medicine

## 2016-08-15 ENCOUNTER — Inpatient Hospital Stay (HOSPITAL_COMMUNITY): Payer: Medicare Other | Admitting: Anesthesiology

## 2016-08-15 DIAGNOSIS — M79604 Pain in right leg: Secondary | ICD-10-CM

## 2016-08-15 DIAGNOSIS — Z992 Dependence on renal dialysis: Secondary | ICD-10-CM | POA: Diagnosis not present

## 2016-08-15 DIAGNOSIS — E1129 Type 2 diabetes mellitus with other diabetic kidney complication: Secondary | ICD-10-CM | POA: Diagnosis not present

## 2016-08-15 DIAGNOSIS — N186 End stage renal disease: Secondary | ICD-10-CM | POA: Diagnosis not present

## 2016-08-15 HISTORY — PX: FEMUR IM NAIL: SHX1597

## 2016-08-15 LAB — GLUCOSE, CAPILLARY
GLUCOSE-CAPILLARY: 73 mg/dL (ref 65–99)
GLUCOSE-CAPILLARY: 76 mg/dL (ref 65–99)
GLUCOSE-CAPILLARY: 87 mg/dL (ref 65–99)
GLUCOSE-CAPILLARY: 83 mg/dL (ref 65–99)
GLUCOSE-CAPILLARY: 121 mg/dL — AB (ref 65–99)
Glucose-Capillary: 154 mg/dL — ABNORMAL HIGH (ref 65–99)

## 2016-08-15 LAB — CBC
HCT: 23.9 % — ABNORMAL LOW (ref 36.0–46.0)
HEMOGLOBIN: 7.8 g/dL — AB (ref 12.0–15.0)
MCH: 31.1 pg (ref 26.0–34.0)
MCHC: 32.6 g/dL (ref 30.0–36.0)
MCV: 95.2 fL (ref 78.0–100.0)
PLATELETS: 292 10*3/uL (ref 150–400)
RBC: 2.51 MIL/uL — AB (ref 3.87–5.11)
RDW: 17.6 % — ABNORMAL HIGH (ref 11.5–15.5)
WBC: 9.5 10*3/uL (ref 4.0–10.5)

## 2016-08-15 LAB — HEMOGLOBIN A1C
Hgb A1c MFr Bld: 5.9 % — ABNORMAL HIGH (ref 4.8–5.6)
Mean Plasma Glucose: 123 mg/dL

## 2016-08-15 LAB — PREPARE RBC (CROSSMATCH)

## 2016-08-15 LAB — SURGICAL PCR SCREEN
MRSA, PCR: NEGATIVE
Staphylococcus aureus: NEGATIVE

## 2016-08-15 SURGERY — INSERTION, INTRAMEDULLARY ROD, FEMUR, RETROGRADE
Anesthesia: General | Laterality: Right

## 2016-08-15 MED ORDER — EPHEDRINE 5 MG/ML INJ
INTRAVENOUS | Status: AC
  Filled 2016-08-15: qty 10

## 2016-08-15 MED ORDER — FENTANYL CITRATE (PF) 100 MCG/2ML IJ SOLN
INTRAMUSCULAR | Status: DC | PRN
  Administered 2016-08-15: 50 ug via INTRAVENOUS

## 2016-08-15 MED ORDER — ROCURONIUM BROMIDE 100 MG/10ML IV SOLN
INTRAVENOUS | Status: DC | PRN
  Administered 2016-08-15: 30 mg via INTRAVENOUS

## 2016-08-15 MED ORDER — LIDOCAINE 2% (20 MG/ML) 5 ML SYRINGE
INTRAMUSCULAR | Status: AC
  Filled 2016-08-15: qty 5

## 2016-08-15 MED ORDER — DEXAMETHASONE SODIUM PHOSPHATE 10 MG/ML IJ SOLN
INTRAMUSCULAR | Status: AC
  Filled 2016-08-15: qty 1

## 2016-08-15 MED ORDER — DARBEPOETIN ALFA 100 MCG/0.5ML IJ SOSY: 100.0000 ug | PREFILLED_SYRINGE | INTRAMUSCULAR | Status: DC

## 2016-08-15 MED ORDER — NEOSTIGMINE METHYLSULFATE 10 MG/10ML IV SOLN
INTRAVENOUS | Status: DC | PRN
  Administered 2016-08-15: 4 mg via INTRAVENOUS

## 2016-08-15 MED ORDER — SODIUM CHLORIDE 0.9 % IV SOLN: Freq: Once | INTRAVENOUS | Status: DC

## 2016-08-15 MED ORDER — FENTANYL CITRATE (PF) 100 MCG/2ML IJ SOLN: 25.0000 ug | INTRAMUSCULAR | Status: DC | PRN

## 2016-08-15 MED ORDER — FENTANYL CITRATE (PF) 100 MCG/2ML IJ SOLN
25.0000 ug | INTRAMUSCULAR | Status: DC | PRN
  Administered 2016-08-15 – 2016-08-16 (×3): 25 ug via INTRAVENOUS
  Filled 2016-08-15 (×4): qty 2

## 2016-08-15 MED ORDER — FENTANYL CITRATE (PF) 250 MCG/5ML IJ SOLN
INTRAMUSCULAR | Status: AC
  Filled 2016-08-15: qty 5

## 2016-08-15 MED ORDER — NEOSTIGMINE METHYLSULFATE 5 MG/5ML IV SOSY
PREFILLED_SYRINGE | INTRAVENOUS | Status: AC
  Filled 2016-08-15: qty 5

## 2016-08-15 MED ORDER — DOXYCYCLINE HYCLATE 100 MG PO CAPS: 100.0000 mg | ORAL_CAPSULE | Freq: Two times a day (BID) | ORAL | 0 refills | Status: AC

## 2016-08-15 MED ORDER — LIDOCAINE HCL (CARDIAC) 20 MG/ML IV SOLN
INTRAVENOUS | Status: DC | PRN
  Administered 2016-08-15: 60 mg via INTRAVENOUS

## 2016-08-15 MED ORDER — ONDANSETRON HCL 4 MG/2ML IJ SOLN
INTRAMUSCULAR | Status: DC | PRN
  Administered 2016-08-15: 4 mg via INTRAVENOUS

## 2016-08-15 MED ORDER — PROPOFOL 10 MG/ML IV BOLUS
INTRAVENOUS | Status: AC
  Filled 2016-08-15: qty 20

## 2016-08-15 MED ORDER — PROMETHAZINE HCL 25 MG/ML IJ SOLN: 6.2500 mg | INTRAMUSCULAR | Status: DC | PRN

## 2016-08-15 MED ORDER — GLYCOPYRROLATE 0.2 MG/ML IJ SOLN
INTRAMUSCULAR | Status: DC | PRN
  Administered 2016-08-15: 0.6 mg via INTRAVENOUS

## 2016-08-15 MED ORDER — ONDANSETRON HCL 4 MG/2ML IJ SOLN
4.0000 mg | Freq: Four times a day (QID) | INTRAMUSCULAR | Status: DC | PRN
  Administered 2016-08-15 (×2): 4 mg via INTRAVENOUS
  Filled 2016-08-15 (×2): qty 2

## 2016-08-15 MED ORDER — TRANEXAMIC ACID 1000 MG/10ML IV SOLN
1000.0000 mg | INTRAVENOUS | Status: AC
  Administered 2016-08-15: 1000 mg via INTRAVENOUS
  Filled 2016-08-15: qty 10

## 2016-08-15 MED ORDER — PROPOFOL 10 MG/ML IV BOLUS
INTRAVENOUS | Status: DC | PRN
  Administered 2016-08-15: 100 mg via INTRAVENOUS

## 2016-08-15 MED ORDER — DOXYCYCLINE HYCLATE 100 MG PO TABS
100.0000 mg | ORAL_TABLET | Freq: Two times a day (BID) | ORAL | Status: DC
  Administered 2016-08-15 – 2016-08-17 (×4): 100 mg via ORAL
  Filled 2016-08-15 (×4): qty 1

## 2016-08-15 MED ORDER — OMEPRAZOLE 20 MG PO CPDR: 20.0000 mg | DELAYED_RELEASE_CAPSULE | Freq: Every day | ORAL | 0 refills | Status: DC

## 2016-08-15 MED ORDER — ROCURONIUM BROMIDE 10 MG/ML (PF) SYRINGE
PREFILLED_SYRINGE | INTRAVENOUS | Status: AC
  Filled 2016-08-15: qty 5

## 2016-08-15 MED ORDER — ACETAMINOPHEN 325 MG PO TABS
650.0000 mg | ORAL_TABLET | Freq: Four times a day (QID) | ORAL | Status: DC | PRN
  Administered 2016-08-16 – 2016-08-17 (×2): 650 mg via ORAL
  Filled 2016-08-15 (×3): qty 2

## 2016-08-15 MED ORDER — EPHEDRINE SULFATE 50 MG/ML IJ SOLN
INTRAMUSCULAR | Status: DC | PRN
  Administered 2016-08-15: 10 mg via INTRAVENOUS
  Administered 2016-08-15: 5 mg via INTRAVENOUS

## 2016-08-15 MED ORDER — CEFAZOLIN SODIUM-DEXTROSE 1-4 GM/50ML-% IV SOLN
1.0000 g | Freq: Four times a day (QID) | INTRAVENOUS | Status: AC
  Administered 2016-08-15 – 2016-08-16 (×3): 1 g via INTRAVENOUS
  Filled 2016-08-15 (×3): qty 50

## 2016-08-15 MED ORDER — ONDANSETRON HCL 4 MG/2ML IJ SOLN
INTRAMUSCULAR | Status: AC
  Filled 2016-08-15: qty 2

## 2016-08-15 MED ORDER — BUPIVACAINE-EPINEPHRINE 0.5% -1:200000 IJ SOLN
INTRAMUSCULAR | Status: DC | PRN
  Administered 2016-08-15: 10 mL

## 2016-08-15 MED ORDER — 0.9 % SODIUM CHLORIDE (POUR BTL) OPTIME
TOPICAL | Status: DC | PRN
  Administered 2016-08-15: 1000 mL

## 2016-08-15 MED ORDER — ASPIRIN EC 325 MG PO TBEC: 325.0000 mg | DELAYED_RELEASE_TABLET | Freq: Every day | ORAL | 0 refills | Status: DC

## 2016-08-15 MED ORDER — HYDROCODONE-ACETAMINOPHEN 5-325 MG PO TABS: 1.0000 | ORAL_TABLET | Freq: Four times a day (QID) | ORAL | 0 refills | Status: DC | PRN

## 2016-08-15 SURGICAL SUPPLY — 63 items
BANDAGE ACE 4X5 VEL STRL LF (GAUZE/BANDAGES/DRESSINGS) ×2 IMPLANT
BANDAGE ACE 6X5 VEL STRL LF (GAUZE/BANDAGES/DRESSINGS) ×2 IMPLANT
BANDAGE ESMARK 6X9 LF (GAUZE/BANDAGES/DRESSINGS) IMPLANT
BIT DRILL CALIBRATED 4.3MMX365 (DRILL) ×1 IMPLANT
BIT DRILL CROWE PNT TWST 4.5MM (DRILL) ×1 IMPLANT
BLADE CLIPPER SURG (BLADE) IMPLANT
BNDG COHESIVE 6X5 TAN STRL LF (GAUZE/BANDAGES/DRESSINGS) ×2 IMPLANT
BNDG ESMARK 6X9 LF (GAUZE/BANDAGES/DRESSINGS)
CLSR STERI-STRIP ANTIMIC 1/2X4 (GAUZE/BANDAGES/DRESSINGS) ×2 IMPLANT
COVER SURGICAL LIGHT HANDLE (MISCELLANEOUS) ×2 IMPLANT
CUFF TOURNIQUET SINGLE 34IN LL (TOURNIQUET CUFF) IMPLANT
DRAPE C-ARM 42X72 X-RAY (DRAPES) ×2 IMPLANT
DRAPE HALF SHEET 40X57 (DRAPES) ×4 IMPLANT
DRAPE IMP U-DRAPE 54X76 (DRAPES) ×2 IMPLANT
DRAPE INCISE IOBAN 66X45 STRL (DRAPES) ×2 IMPLANT
DRAPE ORTHO SPLIT 77X108 STRL (DRAPES) ×2
DRAPE SURG ORHT 6 SPLT 77X108 (DRAPES) ×2 IMPLANT
DRAPE U-SHAPE 47X51 STRL (DRAPES) ×2 IMPLANT
DRILL CALIBRATED 4.3MMX365 (DRILL) ×2
DRILL CROWE POINT TWIST 4.5MM (DRILL) ×2
DRSG MEPILEX BORDER 4X4 (GAUZE/BANDAGES/DRESSINGS) ×2 IMPLANT
DRSG MEPILEX BORDER 4X8 (GAUZE/BANDAGES/DRESSINGS) ×2 IMPLANT
DURAPREP 26ML APPLICATOR (WOUND CARE) ×2 IMPLANT
ELECT REM PT RETURN 9FT ADLT (ELECTROSURGICAL) ×2
ELECTRODE REM PT RTRN 9FT ADLT (ELECTROSURGICAL) ×1 IMPLANT
GAUZE XEROFORM 5X9 LF (GAUZE/BANDAGES/DRESSINGS) ×2 IMPLANT
GLOVE BIO SURGEON STRL SZ7.5 (GLOVE) ×4 IMPLANT
GLOVE BIOGEL PI IND STRL 8 (GLOVE) ×2 IMPLANT
GLOVE BIOGEL PI INDICATOR 8 (GLOVE) ×2
GOWN STRL REUS W/ TWL LRG LVL3 (GOWN DISPOSABLE) ×2 IMPLANT
GOWN STRL REUS W/ TWL XL LVL3 (GOWN DISPOSABLE) ×1 IMPLANT
GOWN STRL REUS W/TWL LRG LVL3 (GOWN DISPOSABLE) ×2
GOWN STRL REUS W/TWL XL LVL3 (GOWN DISPOSABLE) ×1
GUIDEWIRE BEAD TIP (WIRE) ×2 IMPLANT
IMMOBILIZER KNEE 22 (SOFTGOODS) ×2 IMPLANT
KIT BASIN OR (CUSTOM PROCEDURE TRAY) ×2 IMPLANT
KIT ROOM TURNOVER OR (KITS) ×2 IMPLANT
MANIFOLD NEPTUNE II (INSTRUMENTS) ×2 IMPLANT
NAIL FEM RETRO 10.5X320 (Nail) ×2 IMPLANT
NEEDLE 22X1 1/2 (OR ONLY) (NEEDLE) ×2 IMPLANT
NS IRRIG 1000ML POUR BTL (IV SOLUTION) ×2 IMPLANT
PACK GENERAL/GYN (CUSTOM PROCEDURE TRAY) ×2 IMPLANT
PACK UNIVERSAL I (CUSTOM PROCEDURE TRAY) ×2 IMPLANT
PAD ABD 8X10 STRL (GAUZE/BANDAGES/DRESSINGS) ×4 IMPLANT
PAD ARMBOARD 7.5X6 YLW CONV (MISCELLANEOUS) ×4 IMPLANT
PADDING CAST COTTON 6X4 STRL (CAST SUPPLIES) ×2 IMPLANT
PIN GUIDE 3.2 903003004 (MISCELLANEOUS) ×2 IMPLANT
REAMER ONE STEP 12.2MM (TRAUMA) ×2 IMPLANT
SCREW CORT TI DBL LEAD 5X34 (Screw) ×4 IMPLANT
SCREW CORT TI DBL LEAD 5X44 (Screw) ×2 IMPLANT
SCREW CORT TI DBL LEAD 5X46 (Screw) ×2 IMPLANT
SCREW CORT TI DBL LEAD 5X56 (Screw) ×2 IMPLANT
SCREW CORT TI DBL LEAD 5X70 (Screw) ×2 IMPLANT
STOCKINETTE IMPERVIOUS LG (DRAPES) ×2 IMPLANT
SUT MNCRL AB 4-0 PS2 18 (SUTURE) ×6 IMPLANT
SUT MON AB 2-0 CT1 27 (SUTURE) ×2 IMPLANT
SUT VIC AB 0 CT1 27 (SUTURE) ×1
SUT VIC AB 0 CT1 27XBRD ANBCTR (SUTURE) ×1 IMPLANT
SYR CONTROL 10ML LL (SYRINGE) ×2 IMPLANT
TOWEL OR 17X24 6PK STRL BLUE (TOWEL DISPOSABLE) ×2 IMPLANT
TOWEL OR 17X26 10 PK STRL BLUE (TOWEL DISPOSABLE) ×2 IMPLANT
TOWEL OR NON WOVEN STRL DISP B (DISPOSABLE) ×2 IMPLANT
WATER STERILE IRR 1000ML POUR (IV SOLUTION) ×2 IMPLANT

## 2016-08-15 NOTE — Op Note (Addendum)
08/13/2016 - 08/15/2016  10:29 AM  PATIENT:  [name]    PRE-OPERATIVE DIAGNOSIS:  right femur fracture  POST-OPERATIVE DIAGNOSIS:  Same  PROCEDURE:  INTRAMEDULLARY (IM) RETROGRADE FEMORAL NAILING  SURGEON:  Jodey Burbano D, MD  ASSISTANT: Roxan Hockey, PA-C, he was present and scrubbed throughout the case, critical for completion in a timely fashion, and for retraction, instrumentation, and closure.   ANESTHESIA:   Gen  PREOPERATIVE INDICATIONS:  [name] is a  83 y.o. female with a diagnosis of right femur fracture who failed conservative measures and elected for surgical management.    The risks benefits and alternatives were discussed with the patient preoperatively including but not limited to the risks of infection, bleeding, nerve injury, cardiopulmonary complications, the need for revision surgery, among others, and the patient was willing to proceed.  OPERATIVE IMPLANTS: [employer] [place] nail 32x10.5  OPERATIVE FINDINGS: unstable distal femur fracture   BLOOD LOSS: 50  COMPLICATIONS: none  TOURNIQUET TIME: none  OPERATIVE PROCEDURE:  Patient was identified in the preoperative holding area and site was marked by me She was transported to the operating theater and placed on the table in supine position taking care to pad all bony prominences. After a preincinduction time out anesthesia was induced. The right Lower extremity was prepped and draped in normal sterile fashion and a pre-incision timeout was performed. She received ancef for preoperative antibiotics.   I debrided her prepatellar wound with an excisional debridement of skin and removed a fiberwire stitch, I then re prepped and placed ioban  I performed a closed reduction of the fracture.  I then made a medial parapatellar incision and dissected down to the peritenon. I continued that incision to the medial parapatellar approach and was careful to protect the articular cartilage meniscus and  cruciates.  Next I inserted the guidepin under fluoroscopic visualization at the tip of [place] line and centered on the AP. I used the entry reamer to ream an entryway into the canal this point.  Next I passed the ball tip wire across the fracture confirmed in appropriate reduction and sequentially reamed up to the appropriate size for the above nail. The nail ended above the subtrochanteric region.  I selected the nail and inserted it under fluoroscopic guidance.  I was happy with the reduction I then placed four distal interlock screws obtaining a good purchase with all of them.  I then locked the distal screws and using a perfect circles technique placed a anterior to posterior proximal interlock screw.  final fluoroscopic images showed appropriate reduction and alignment of hardware I then thoroughly irrigated all wounds and the skin was closed.  POST OPERATIVE PLAN: Nonweightbearing knee immobilizer full time. DVT px: SCD's and chemical px

## 2016-08-15 NOTE — H&P (View-Only) (Signed)
ORTHOPAEDIC CONSULTATION  REQUESTING PHYSICIAN: Mariel Aloe, MD  Chief Complaint: Right knee pain.  Assessment / Plan: Principal Problem:   Leg fracture Active Problems:   HTN (hypertension)   DM2 (diabetes mellitus, type 2) (HCC)   Anemia   Chronic combined systolic and diastolic heart failure EF 45-50% by echo 06/25/2012   ESRD on dialysis ([place])   PAF (paroxysmal atrial fibrillation) (HCC)  Right supracondylar femur fracture: This is in the setting of previous IM nail tibia for fracture one year ago and chronic difficult wound healing with some recent drainage and what appears to be retained suture. Stabilization of her femur is necessary and proposed surgical correction-retrograde nail is proposed.  Nothing by mouth at midnight Bedrest for now. Will update weightbearing postoperatively. Planning for operative fixation tomorrow morning by Dr. Christia Reading Murphy-08/15/2016  The risks benefits and alternatives of the procedure were discussed with the patient including but not limited to infection, bleeding, nerve injury, the need for revision surgery, blood clots, cardiopulmonary complications, morbidity, mortality, among others.  The patient verbalizes understanding and wishes to proceed.  We specifically discussed the potential of chronic wound and complications from same given difficulty healing from previous surgery.   HPI: [name] is a 83 y.o. female who complains of right knee pain after a fall onto her right leg.  She presented to the emergency department where x-rays showed supracondylar right femur fracture and possible acute fracture in the area of previous injury without significant displacement. Orthopedics was consulted for evaluation. She has a history of fracture of her tibia on this side repaired with IM nail about 1 year ago. It had healed well until about 3 months ago when she has had "bubbles come up ". She did have skin grafting which failed and  refused follow-up at [facility]. No fever or systemic symptoms.  She denies history of DVT, PE, MI, or CVA. She has CKD end-stage on dialysis Monday Wednesday Friday.  Past Medical History:  Diagnosis Date  . Arthritis   . Atrial fibrillation ([place]) 06/25/2012   2D Echo - EF 45-50, left atrium severely dilated, mitral valve heavily calcified annulus with calcification of the subvalvular apparatus - no longer on Coumadin  . Breast cancer ([place])   . CHF (congestive heart failure) ([place])   . DM2 (diabetes mellitus, type 2) ([place])   . ESRD (end stage renal disease) ([place])    HD MWF - [facility]  . HTN (hypertension)   . Hyperlipidemia   . Hyperparathyroidism, secondary renal ([place])   . Hypothyroid   . Shortness of breath   . Tibia/fibula fracture 07/10/2011   right - nonunion repair 06/2012   Past Surgical History:  Procedure Laterality Date  . ABDOMINAL SURGERY    . ANKLE FUSION Right   . ARTERIOVENOUS GRAFT PLACEMENT Right   . BREAST SURGERY Left    Mastectomy  . EYE SURGERY Bilateral    Cataract  . MASTECTOMY    . ORIF TIBIA FRACTURE Right 06/16/2012   Procedure: OPEN REDUCTION INTERNAL FIXATION (ORIF) TIBIA FRACTURE;  Surgeon: Rozanna Box, MD;  Location: [place];  Service: Orthopedics;  Laterality: Right;  . THYROID SURGERY     Social History   Social History  . Marital status: Single    Spouse name: N/A  . Number of children: N/A  . Years of education: N/A   Social History Main Topics  . Smoking status: Never Smoker  . Smokeless tobacco: Current User  Types: Snuff  . Alcohol use No  . Drug use: No  . Sexual activity: No   Other Topics Concern  . None   Social History Narrative  . None   Family History  Problem Relation Age of Onset  . Hyperlipidemia Father   . Diabetes Father   . Breast cancer Sister    Allergies  Allergen Reactions  . Codeine Shortness Of Breath, Swelling and Other (See Comments)    Swelling of the tongue. Pt tolerates Vicodin.  . Coreg  [Carvedilol] Other (See Comments)    Headaches   . Lopressor [Metoprolol Tartrate] Other (See Comments)    GI upset  . Metoprolol Other (See Comments)    GI upset  . Oxycodone Other (See Comments)    Hallucinations (also with Oxycontin)   Prior to Admission medications   Medication Sig Start Date End Date Taking? Authorizing Provider  aspirin EC 81 MG tablet Take 81 mg by mouth daily.   Yes [provider]  b complex-vitamin c-folic acid (NEPHRO-VITE) 0.8 MG TABS tablet Take 1 tablet by mouth at bedtime. 08/29/15  Yes Burns, Claudina Lick, MD  Cholecalciferol (VITAMIN D-3 PO) Take 1 capsule by mouth daily.   Yes [provider]  darbepoetin (ARANESP) 100 MCG/0.5ML SOLN Inject 0.5 mLs (100 mcg total) into the vein every Wednesday with hemodialysis. 07/25/12  Yes Kilroy, Luke K, PA-C  glimepiride (AMARYL) 1 MG tablet TAKE 3 TABLETS(3 MG) BY MOUTH DAILY WITH BREAKFAST Patient taking differently: Take 1 mg by mouth once a day 04/01/16  Yes Burns, Claudina Lick, MD  hydrALAZINE (APRESOLINE) 50 MG tablet TAKE 1 TABLET(50 MG) BY MOUTH TWICE DAILY Patient taking differently: Take 50 mg by mouth once a day 01/15/16  Yes Burns, Claudina Lick, MD  isosorbide mononitrate (IMDUR) 30 MG 24 hr tablet Take 1 tablet (30 mg total) by mouth daily. 07/28/15  Yes Angiulli, Lavon Paganini, PA-C  levothyroxine (SYNTHROID, LEVOTHROID) 75 MCG tablet Take 1 tablet (75 mcg total) by mouth daily before breakfast. --- Office visit needed for further refills Patient taking differently: Take 75 mcg by mouth daily before breakfast.  05/15/16  Yes Burns, Claudina Lick, MD  nebivolol (BYSTOLIC) 10 MG tablet Take 1 tablet (10 mg total) by mouth daily. 08/29/15  Yes Burns, Claudina Lick, MD  pravastatin (PRAVACHOL) 20 MG tablet Take 1 tablet (20 mg total) by mouth daily at 6 PM. 08/29/15  Yes Burns, Claudina Lick, MD  sevelamer carbonate (RENVELA) 800 MG tablet Take 1 tablet (800 mg total) by mouth 3 (three) times daily with meals. 08/29/15  Yes Burns, Claudina Lick, MD  vitamin E 200 UNIT capsule Take 200 Units by mouth daily.   Yes [provider]  diclofenac sodium (VOLTAREN) 1 % GEL Apply 2 g topically 3 (three) times daily. Patient not taking: Reported on 08/13/2016 07/28/15   Cathlyn Parsons, PA-C   Chest Portable 1 View  Result Date: 08/14/2016 CLINICAL DATA:  83 year old female with leg fracture. Preop evaluation. EXAM: PORTABLE CHEST 1 VIEW COMPARISON:  Chest CT dated 12/02/2013 FINDINGS: The lungs are clear. There is no pleural effusion or pneumothorax. Top-normal cardiac size. There is atherosclerotic calcification of the thoracic aorta. No acute osseous pathology. Right rotator cuff surgery and humeral head anchor pins. Partially visualized vascular stent in the right axilla. IMPRESSION: No active disease. Electronically Signed   By: Anner Crete M.D.   On: 08/14/2016 02:17   Dg Knee Complete 4 Views Right  Result Date: 08/13/2016 CLINICAL  DATA:  Fall.  History of the surgery. EXAM: RIGHT KNEE - COMPLETE 4+ VIEW COMPARISON:  07/10/2011. FINDINGS: Prior ORIF right tibia. An acute fracture of the proximal tibia appears to be present. Acute fracture of the distal femoral shaft is present. Fractures are slightly separated. IMPRESSION: 1.  Acute fractures of the distal femur. 2. Prior ORIF right tibia. Superimposed acute fracture appears to be present. Both femoral and tibial fractures are slightly separated. Electronically Signed   By: Marcello Moores  Register   On: 08/13/2016 13:27    Positive ROS: All other systems have been reviewed and were otherwise negative with the exception of those mentioned in the HPI and as above.  Objective: Labs cbc  Recent Labs  08/13/16 1639  WBC 9.9  HGB 8.9*  HCT 28.5*  PLT 381     Recent Labs  08/13/16 1639  NA 141  K 4.3  CL 99*  CO2 29  GLUCOSE 222*  BUN 23*  CREATININE 5.50*  CALCIUM 8.6*    Physical Exam: Vitals:   08/13/16 2301 08/14/16 0423  BP: (!) 139/36 (!) 114/24  Pulse:  65 (!) 58  Resp: 16 16  Temp: 98.1 F (36.7 C) 98.7 F (37.1 C)   General: Alert, no acute distress Mental status: Alert and Oriented x3 Neurologic: Speech Clear and organized, no gross focal findings or movement disorder appreciated. Respiratory: No cyanosis, no use of accessory musculature Cardiovascular: No pedal edema GI: Abdomen is soft and non-tender, non-distended. Skin: Warm and dry.   Extremities: Warm w/o edema Psychiatric: Patient is competent for consent with normal mood and affect  MUSCULOSKELETAL:  Right leg pain with range of motion. EHL, FHL, dorsiflexion, plantar flexion intact. Sensation intact distally. Right knee with what appears to be chronic wound extending horizontally over her mid patella-1 area with scant cloudy drainage and what appears to be retained suture or foreign body. Knee without significant effusion. No surrounding erythema or induration. Other extremities are atraumatic with painless ROM and NVI.   Prudencio Burly III PA-C 08/14/2016 8:16 AM

## 2016-08-15 NOTE — Interval H&P Note (Signed)
History and Physical Interval Note:  08/15/2016 7:21 AM  [name] [name]  has presented today for surgery, with the diagnosis of right femur fracture  The various methods of treatment have been discussed with the patient and family. After consideration of risks, benefits and other options for treatment, the patient has consented to  Procedure(s): INTRAMEDULLARY (IM) RETROGRADE FEMORAL NAILING (Right) as a surgical intervention .  The patient's history has been reviewed, patient examined, no change in status, stable for surgery.  I have reviewed the patient's chart and labs.  Questions were answered to the patient's satisfaction.     Zaven Klemens D

## 2016-08-15 NOTE — Anesthesia Postprocedure Evaluation (Signed)
Anesthesia Post Note  Patient: [name]  Procedure(s) Performed: Procedure(s) (LRB): INTRAMEDULLARY (IM) RETROGRADE FEMORAL NAILING (Right)     Patient location during evaluation: PACU Anesthesia Type: General Level of consciousness: awake and alert Pain management: pain level controlled Vital Signs Assessment: post-procedure vital signs reviewed and stable Respiratory status: spontaneous breathing, nonlabored ventilation, respiratory function stable and patient connected to nasal cannula oxygen Cardiovascular status: blood pressure returned to baseline and stable Postop Assessment: no signs of nausea or vomiting Anesthetic complications: no    Last Vitals:  Vitals:   08/15/16 1130 08/15/16 1145  BP: (!) 153/44 (!) 151/43  Pulse: 63 61  Resp: 20 (!) 25  Temp:      Last Pain:  Vitals:   08/15/16 1145  TempSrc:   PainSc: 0-No pain                 Tiajuana Amass

## 2016-08-15 NOTE — Progress Notes (Signed)
[facility] Progress Note  Background: 83 y.o. female with ESRD on HD, DM, HTN, chronic combined CHF, Afib not on anticoagulation, h/o breast cancer. She is admitted with right distal femur and proximal tibia fractures. She sustained the fracture after falling at home 5/29.  Has been evaluated by ortho who are planning surgical repair tomorrow. She is s/p right tib/fib fracture 2014 with ORIF. Treated at [facility] in 2017 for right knee infection with skin grafting and has had some problems poor wound healing. Ortho surgical repair .  Dialysis Orders:  [facility] MWF 3.5h 180F BFR 350/800 3K/2.5 Ca Profile 4 EDW 60 kg R AVF No heparin  Calcitriol 0.5 mcg PO q HD Mircera 75 mcg IV q 2 weeks (last 5/23) Venofer 100 mg IV q HD until 6/6    Assessment/Plan: 1. R knee fractures - fall with fracture of proximal tibia/distal femur - s/p ORIF 5/31 per ortho/ on PO doxy  2.  ESRD -  MWF - Cont on schedule No heparin  3.  Hypertension/volume  - BP controlled cont home meds/volume stabld 4.  Anemia  - Hgb 7.8  - s/p 1 unit pRBC 5/31 follow transfuse prn / last esa on 5/23 redose 100 Aranesp with HD 6/1  5.  Metabolic bone disease -  Cont VDRA/Renvela binder  6.  Nutrition - Renal diet/prostat for low albumin  7. DM - per primary    Subjective:   Had ortho surgery this am.  Family present  Not comfortable Feels "burning" sensation medial R thigh - ortho to evaluate  Objective Vitals:   08/15/16 1130 08/15/16 1145 08/15/16 1155 08/15/16 1215  BP: (!) 153/44 (!) 151/43  (!) 159/37  Pulse: 63 61 64 (!) 51  Resp: 20 (!) 25  20  Temp:  97.7 F (36.5 C)  97.8 F (36.6 C)  TempSrc:    Oral  SpO2: 96% 96% 97% 96%  Weight:      Height:       Physical Exam General: WDWN elderly AAF female laying bed NAD  Lungs: CTAB Breathing is unlabored. Heart: RRR with S1 S2 Abdomen: soft NT + BS Lower extremities: R LE wrapped in brace  Dialysis Access: RUE AVF+bruit   Additional  Objective Labs: Basic Metabolic Panel:  Recent Labs Lab 08/13/16 1639 08/14/16 1300  NA 141 140  K 4.3 4.7  CL 99* 102  CO2 29 25  GLUCOSE 222* 222*  BUN 23* 29*  CREATININE 5.50* 6.87*  CALCIUM 8.6* 7.8*  PHOS  --  5.2*   Liver Function Tests:  Recent Labs Lab 08/14/16 1300  ALBUMIN 2.3*   No results for input(s): LIPASE, AMYLASE in the last 168 hours. CBC:  Recent Labs Lab 08/13/16 1639 08/14/16 1006 08/15/16 0724  WBC 9.9 9.2 9.5  NEUTROABS 7.8*  --   --   HGB 8.9* 8.2* 7.8*  HCT 28.5* 26.0* 23.9*  MCV 94.7 93.9 95.2  PLT 381 334 292   Blood Culture    Component Value Date/Time   SDES TISSUE RIGHT TIBIA 06/16/2012 0933   SDES TISSUE RIGHT TIBIA 06/16/2012 0933   SDES TISSUE RIGHT TIBIA 06/16/2012 0933   SPECREQUEST DISTAL NON UNION EXCAVAGATION 06/16/2012 0933   SPECREQUEST DISTAL NON UNION EXCAVAGATION 06/16/2012 0933   SPECREQUEST DISTAL NON UNION EXCAVAGATION 06/16/2012 0933   CULT NO ANAEROBES ISOLATED 06/16/2012 0933   CULT NO GROWTH 3 DAYS 06/16/2012 0933   REPTSTATUS 06/21/2012 FINAL 06/16/2012 0933   REPTSTATUS 06/19/2012 FINAL 06/16/2012 0933  REPTSTATUS 06/16/2012 FINAL 06/16/2012 0933    Cardiac Enzymes: No results for input(s): CKTOTAL, CKMB, CKMBINDEX, TROPONINI in the last 168 hours. CBG:  Recent Labs Lab 08/14/16 2142 08/15/16 0631 08/15/16 0734 08/15/16 1112 08/15/16 1212  GLUCAP 209* 73 76 87 83   Iron Studies: No results for input(s): IRON, TIBC, TRANSFERRIN, FERRITIN in the last 72 hours. Lab Results  Component Value Date   INR 0.99 07/31/2016   INR 4.3 08/13/2012   INR 1.7 07/30/2012   Medications: . sodium chloride    . sodium chloride    .  ceFAZolin (ANCEF) IV 1 g (08/15/16 1443)  . methocarbamol (ROBAXIN)  IV     . aspirin  325 mg Oral Daily  . [START ON 08/16/2016] calcitRIOL  0.5 mcg Oral Q M,W,F-HD  . doxycycline  100 mg Oral Q12H  . feeding supplement (PRO-STAT SUGAR FREE 64)  30 mL Oral BID  .  hydrALAZINE  50 mg Oral BID  . insulin aspart  0-5 Units Subcutaneous QHS  . insulin aspart  0-9 Units Subcutaneous TID WC  . isosorbide mononitrate  30 mg Oral Daily  . levothyroxine  75 mcg Oral QAC breakfast  . multivitamin  1 tablet Oral QHS  . nebivolol  10 mg Oral Daily  . pravastatin  20 mg Oral q1800  . sevelamer carbonate  1,600 mg Oral TID WC     Lynnda Child PA-C [facility] Pager [phone] 08/15/2016,3:09 PM  LOS: 2 days   Renal Attending: Post op femur repair today.  Got PRBCs.  Will plan HD in AM. Dilana Mcphie C

## 2016-08-15 NOTE — Anesthesia Procedure Notes (Signed)
Procedure Name: Intubation Date/Time: 08/15/2016 9:08 AM Performed by: Manuela Schwartz B Pre-anesthesia Checklist: Patient identified, Emergency Drugs available, Suction available, Patient being monitored and Timeout performed Patient Re-evaluated:Patient Re-evaluated prior to inductionOxygen Delivery Method: Circle system utilized Preoxygenation: Pre-oxygenation with 100% oxygen Intubation Type: IV induction Ventilation: Mask ventilation without difficulty Laryngoscope Size: Mac and 3 Grade View: Grade I Tube type: Oral Tube size: 7.0 mm Number of attempts: 1 Airway Equipment and Method: Stylet Placement Confirmation: ETT inserted through vocal cords under direct vision,  positive ETCO2 and breath sounds checked- equal and bilateral Secured at: 21 cm Tube secured with: Tape Dental Injury: Teeth and Oropharynx as per pre-operative assessment

## 2016-08-15 NOTE — Transfer of Care (Signed)
Immediate Anesthesia Transfer of Care Note  Patient: [name]  Procedure(s) Performed: Procedure(s): INTRAMEDULLARY (IM) RETROGRADE FEMORAL NAILING (Right)  Patient Location: PACU  Anesthesia Type:General  Level of Consciousness: sedated and responds to stimulation  Airway & Oxygen Therapy: Patient Spontanous Breathing and Patient connected to nasal cannula oxygen  Post-op Assessment: Report given to RN, Post -op Vital signs reviewed and stable and Patient moving all extremities  Post vital signs: Reviewed and stable  Last Vitals:  Vitals:   08/15/16 0500 08/15/16 0801  BP: (!) 107/44 (!) 116/32  Pulse: (!) 57 62  Resp: 16 18  Temp: 37 C 36.7 C    Last Pain:  Vitals:   08/15/16 0801  TempSrc: Oral  PainSc:       Patients Stated Pain Goal: 2 (03/21/02 5913)  Complications: No apparent anesthesia complications

## 2016-08-15 NOTE — Clinical Social Work Note (Signed)
Clinical Social Work Assessment  Patient Details  Name: [name] MRN: [id number] Date of Birth: [birth year]  Date of referral:  08/15/16               Reason for consult:  Facility Placement, Discharge Planning                Permission sought to share information with:  Facility Sport and exercise psychologist, Family Supports Permission granted to share information::  Yes, Verbal Permission Granted  Name::     Freight forwarder::  SNF  Relationship::  Daughter  Contact Information:     Housing/Transportation Living arrangements for the past 2 months:  Single Family Home Source of Information:  Adult Children Patient Interpreter Needed:  None Criminal Activity/Legal Involvement Pertinent to Current Situation/Hospitalization:  No - Comment as needed Significant Relationships:  Adult Children Lives with:  Self, Adult Children Do you feel safe going back to the place where you live?  Yes Need for family participation in patient care:  No (Coment)  Care giving concerns:  Pt currently resides with her daughter, but daughter works full time. Pt has increased caregiving needs as a result of surgery and will have no one available during the day to care for her.   Social Worker assessment / plan:  CSW introduced self and discussed SNF preferences with pt, pt's daughter, and pt's niece. Pt's daughter indicated that she was interested in SNF placement because she had already been through home health PT and the therapists would show up and not do anything. Pt's daughter wants to make sure that the pt gets the therapy that she needs to recover from surgery. Pt's daughter requested [facility], and indicated that she had already been discussing the pt's situation with the nurse liaison. CSW will follow to facilitate discharge to [facility] if appropriate.  Employment status:  Retired Forensic scientist:  Medicare PT Recommendations:  Not assessed at this time Alex /  Referral to community resources:  [place]  Patient/Family's Response to care:  Pt agreeable to SNF placement.  Patient/Family's Understanding of and Emotional Response to Diagnosis, Current Treatment, and Prognosis:  Pt and pt's daughter understanding of CSW role in discharge planning. Pt's daughter appreciated help. Pt's daughter expressed worry about making sure the pt has the care that she needs to fully recover and return to previous level of functioning.  Emotional Assessment Appearance:  Appears stated age Attitude/Demeanor/Rapport:    Affect (typically observed):  Appropriate Orientation:  Oriented to Self, Oriented to Place, Oriented to  Time, Oriented to Situation Alcohol / Substance use:  Not Applicable Psych involvement (Current and /or in the community):  No (Comment)  Discharge Needs  Concerns to be addressed:  Care Coordination, Discharge Planning Concerns Readmission within the last 30 days:  No Current discharge risk:  Physical Impairment Barriers to Discharge:  Continued Medical Work up   [employer], [place] 08/15/2016, 3:45 PM

## 2016-08-15 NOTE — Progress Notes (Signed)
PROGRESS NOTE    [name]  NFA:[id number] DOB: Dec 19, 1928 DOA: 08/13/2016 PCP: Binnie Rail, MD   Brief Narrative: [name] is a 83 y.o. female with a medical history significant of DM; HLD; hypothyroidism; afib not on AC; chronic combined heart failure; HTN; ESRD on HD with prior fractures to right leg. She presented with distal femur and proximal tibial fractures. She underwent ORIF on 5/31.   Assessment & Plan:   Principal Problem:   Leg fracture Active Problems:   HTN (hypertension)   DM2 (diabetes mellitus, type 2) (HCC)   Anemia   Chronic combined systolic and diastolic heart failure EF 45-50% by echo 06/25/2012   ESRD on dialysis ([place])   PAF (paroxysmal atrial fibrillation) (HCC)   Distal femur fracture Proximal tibial fracture S/p ORIF today. Pain minimally controlled -PT eval -OT eval -CSW consult for placement (family wants SNF, not CIR) -increase to Fentanyl 41mcg prn -Tylenol 650mg  q6hrs prn  ESRD MWF dialysis -nephrology recommendations  Essential hypertension -continue hydralazine and Bystolic  Diabetes mellitus Amaryl as an outpatient -continue SSI  Heart failure Last EF of 45-50%. Compensated.  Paroxysmal atrial fibrillation Not on Coumadin for years now. -continue Aspirin    DVT prophylaxis: SCDs Code Status: Full code Family Communication: Daughter at bedside Disposition Plan: Discharge to SNF when medically stable   Consultants:   Orthopedic surgery  Procedures:   ORIF (08/15/16)  Antimicrobials:  None    Subjective: Patient reports leg pain.  Objective: Vitals:   08/14/16 1700 08/14/16 2143 08/15/16 0031 08/15/16 0500  BP: (!) 122/40 (!) 125/36 (!) 106/41 (!) 107/44  Pulse: (!) 54 (!) 59 (!) 55 (!) 57  Resp: 15 16 16 16   Temp: 98 F (36.7 C) 98.3 F (36.8 C)  98.6 F (37 C)  TempSrc: Oral Oral  Oral  SpO2: 99% 99% 98% 99%  Weight: 58.5 kg (128 lb 15.5 oz)     Height:        Intake/Output  Summary (Last 24 hours) at 08/15/16 4696 Last data filed at 08/15/16 0600  Gross per 24 hour  Intake            69.67 ml  Output              524 ml  Net          -454.33 ml   Filed Weights   08/13/16 2301 08/14/16 1300 08/14/16 1700  Weight: 59.5 kg (131 lb 1.6 oz) 58.5 kg (128 lb 15.5 oz) 58.5 kg (128 lb 15.5 oz)    Examination:  General exam: Appears calm and comfortable Respiratory system: Clear to auscultation. Respiratory effort normal. Cardiovascular system: S1 & S2 heard, RRR. No murmurs. Gastrointestinal system: Abdomen is nondistended, soft and nontender. Normal bowel sounds heard. Central nervous system: Alert and oriented. No focal neurological deficits. Extremities: No edema. No calf tenderness Skin: No cyanosis. No rashes. Right leg with immobilizer in place Psychiatry: Judgement and insight appear normal. Mood & affect appropriate.     Data Reviewed: I have personally reviewed following labs and imaging studies  CBC:  Recent Labs Lab 08/13/16 1639 08/14/16 1006  WBC 9.9 9.2  NEUTROABS 7.8*  --   HGB 8.9* 8.2*  HCT 28.5* 26.0*  MCV 94.7 93.9  PLT 381 295   Basic Metabolic Panel:  Recent Labs Lab 08/13/16 1639 08/14/16 1300  NA 141 140  K 4.3 4.7  CL 99* 102  CO2 29 25  GLUCOSE 222* 222*  BUN 23* 29*  CREATININE 5.50* 6.87*  CALCIUM 8.6* 7.8*  PHOS  --  5.2*   GFR: Estimated Creatinine Clearance: 4.9 mL/min (A) (by C-G formula based on SCr of 6.87 mg/dL (H)). Liver Function Tests:  Recent Labs Lab 08/14/16 1300  ALBUMIN 2.3*   No results for input(s): LIPASE, AMYLASE in the last 168 hours. No results for input(s): AMMONIA in the last 168 hours. Coagulation Profile: No results for input(s): INR, PROTIME in the last 168 hours. Cardiac Enzymes: No results for input(s): CKTOTAL, CKMB, CKMBINDEX, TROPONINI in the last 168 hours. BNP (last 3 results) No results for input(s): PROBNP in the last 8760 hours. HbA1C:  Recent Labs   08/14/16 0041  HGBA1C 5.9*   CBG:  Recent Labs Lab 08/13/16 2331 08/14/16 0756 08/14/16 2142 08/15/16 0631  GLUCAP 197* 89 209* 73   Lipid Profile: No results for input(s): CHOL, HDL, LDLCALC, TRIG, CHOLHDL, LDLDIRECT in the last 72 hours. Thyroid Function Tests: No results for input(s): TSH, T4TOTAL, FREET4, T3FREE, THYROIDAB in the last 72 hours. Anemia Panel: No results for input(s): VITAMINB12, FOLATE, FERRITIN, TIBC, IRON, RETICCTPCT in the last 72 hours. Sepsis Labs: No results for input(s): PROCALCITON, LATICACIDVEN in the last 168 hours.  Recent Results (from the past 240 hour(s))  Surgical pcr screen     Status: None   Collection Time: 08/14/16 10:48 PM  Result Value Ref Range Status   MRSA, PCR NEGATIVE NEGATIVE Final   Staphylococcus aureus NEGATIVE NEGATIVE Final    Comment:        The Xpert SA Assay (FDA approved for NASAL specimens in patients over 86 years of age), is one component of a comprehensive surveillance program.  Test performance has been validated by [facility] for patients greater than or equal to 78 year old. It is not intended to diagnose infection nor to guide or monitor treatment.          Radiology Studies: Dg Pelvis 1-2 Views  Result Date: 08/14/2016 CLINICAL DATA:  Fall yesterday with right hip pain, initial encounter EXAM: PELVIS - 1-2 VIEW COMPARISON:  None. FINDINGS: Pelvic ring is intact. Degenerative changes of the hip joints are noted bilaterally. No soft tissue abnormality is seen. Calcified uterine fibroids noted. IMPRESSION: No acute abnormality seen. Electronically Signed   By: Inez Catalina M.D.   On: 08/14/2016 09:45   Chest Portable 1 View  Result Date: 08/14/2016 CLINICAL DATA:  83 year old female with leg fracture. Preop evaluation. EXAM: PORTABLE CHEST 1 VIEW COMPARISON:  Chest CT dated 12/02/2013 FINDINGS: The lungs are clear. There is no pleural effusion or pneumothorax. Top-normal cardiac size. There is  atherosclerotic calcification of the thoracic aorta. No acute osseous pathology. Right rotator cuff surgery and humeral head anchor pins. Partially visualized vascular stent in the right axilla. IMPRESSION: No active disease. Electronically Signed   By: Anner Crete M.D.   On: 08/14/2016 02:17   Dg Knee Complete 4 Views Right  Result Date: 08/13/2016 CLINICAL DATA:  Fall.  History of the surgery. EXAM: RIGHT KNEE - COMPLETE 4+ VIEW COMPARISON:  07/10/2011. FINDINGS: Prior ORIF right tibia. An acute fracture of the proximal tibia appears to be present. Acute fracture of the distal femoral shaft is present. Fractures are slightly separated. IMPRESSION: 1.  Acute fractures of the distal femur. 2. Prior ORIF right tibia. Superimposed acute fracture appears to be present. Both femoral and tibial fractures are slightly separated. Electronically Signed   By: Marcello Moores  Register   On: 08/13/2016 13:27  Scheduled Meds: . aspirin  325 mg Oral Daily  . [START ON 08/16/2016] calcitRIOL  0.5 mcg Oral Q M,W,F-HD  . feeding supplement (PRO-STAT SUGAR FREE 64)  30 mL Oral BID  . hydrALAZINE  50 mg Oral BID  . insulin aspart  0-5 Units Subcutaneous QHS  . insulin aspart  0-9 Units Subcutaneous TID WC  . isosorbide mononitrate  30 mg Oral Daily  . levothyroxine  75 mcg Oral QAC breakfast  . multivitamin  1 tablet Oral QHS  . nebivolol  10 mg Oral Daily  . povidone-iodine   Topical Once  . pravastatin  20 mg Oral q1800  . sevelamer carbonate  1,600 mg Oral TID WC   Continuous Infusions: . sodium chloride    . sodium chloride    .  ceFAZolin (ANCEF) IV    . lactated ringers 10 mL/hr at 08/14/16 2302  . methocarbamol (ROBAXIN)  IV       LOS: 2 days     Cordelia Poche, MD [facility] 08/15/2016, 7:22 AM Pager: [phone]  If 7PM-7AM, please contact night-coverage [email or web address] Password [email or web address] 08/15/2016, 7:22 AM

## 2016-08-16 ENCOUNTER — Encounter (HOSPITAL_COMMUNITY): Payer: Self-pay | Admitting: Orthopedic Surgery

## 2016-08-16 LAB — RENAL FUNCTION PANEL
Albumin: 2.2 g/dL — ABNORMAL LOW (ref 3.5–5.0)
Anion gap: 12 (ref 5–15)
BUN: 25 mg/dL — AB (ref 6–20)
CHLORIDE: 100 mmol/L — AB (ref 101–111)
CO2: 23 mmol/L (ref 22–32)
Calcium: 8 mg/dL — ABNORMAL LOW (ref 8.9–10.3)
Creatinine, Ser: 5.63 mg/dL — ABNORMAL HIGH (ref 0.44–1.00)
GFR calc Af Amer: 7 mL/min — ABNORMAL LOW (ref >60)
GFR calc non Af Amer: 6 mL/min — ABNORMAL LOW (ref >60)
GLUCOSE: 119 mg/dL — AB (ref 65–99)
POTASSIUM: 5 mmol/L (ref 3.5–5.1)
Phosphorus: 5.8 mg/dL — ABNORMAL HIGH (ref 2.5–4.6)
Sodium: 135 mmol/L (ref 135–145)

## 2016-08-16 LAB — CBC
HEMATOCRIT: 25.1 % — AB (ref 36.0–46.0)
Hemoglobin: 8.3 g/dL — ABNORMAL LOW (ref 12.0–15.0)
MCH: 30 pg (ref 26.0–34.0)
MCHC: 33.1 g/dL (ref 30.0–36.0)
MCV: 90.6 fL (ref 78.0–100.0)
Platelets: 238 10*3/uL (ref 150–400)
RBC: 2.77 MIL/uL — ABNORMAL LOW (ref 3.87–5.11)
RDW: 17.3 % — AB (ref 11.5–15.5)
WBC: 7 10*3/uL (ref 4.0–10.5)

## 2016-08-16 LAB — IRON AND TIBC
IRON: 12 ug/dL — AB (ref 28–170)
Saturation Ratios: 8 % — ABNORMAL LOW (ref 10.4–31.8)
TIBC: 143 ug/dL — AB (ref 250–450)
UIBC: 131 ug/dL

## 2016-08-16 LAB — GLUCOSE, CAPILLARY
GLUCOSE-CAPILLARY: 162 mg/dL — AB (ref 65–99)
Glucose-Capillary: 120 mg/dL — ABNORMAL HIGH (ref 65–99)
Glucose-Capillary: 158 mg/dL — ABNORMAL HIGH (ref 65–99)
Glucose-Capillary: 188 mg/dL — ABNORMAL HIGH (ref 65–99)

## 2016-08-16 MED ORDER — CALCITRIOL 0.5 MCG PO CAPS
ORAL_CAPSULE | ORAL | Status: AC
  Filled 2016-08-16: qty 1

## 2016-08-16 MED ORDER — HYDROCODONE-ACETAMINOPHEN 5-325 MG PO TABS
ORAL_TABLET | ORAL | Status: AC
  Filled 2016-08-16: qty 1

## 2016-08-16 MED ORDER — NEBIVOLOL HCL 2.5 MG PO TABS
2.5000 mg | ORAL_TABLET | Freq: Every day | ORAL | Status: DC
  Filled 2016-08-16 (×2): qty 1

## 2016-08-16 MED ORDER — TRAMADOL HCL 50 MG PO TABS: 50.0000 mg | ORAL_TABLET | Freq: Two times a day (BID) | ORAL | Status: DC | PRN

## 2016-08-16 MED ORDER — DARBEPOETIN ALFA 100 MCG/0.5ML IJ SOSY
PREFILLED_SYRINGE | INTRAMUSCULAR | Status: AC
  Filled 2016-08-16: qty 0.5

## 2016-08-16 MED ORDER — DARBEPOETIN ALFA 200 MCG/0.4ML IJ SOSY
200.0000 ug | PREFILLED_SYRINGE | INTRAMUSCULAR | Status: DC
  Administered 2016-08-16: 200 ug via INTRAVENOUS

## 2016-08-16 MED ORDER — DARBEPOETIN ALFA 200 MCG/0.4ML IJ SOSY
PREFILLED_SYRINGE | INTRAMUSCULAR | Status: AC
  Filled 2016-08-16: qty 0.4

## 2016-08-16 NOTE — Progress Notes (Signed)
OT Cancellation Note  Patient Details Name: [name] MRN: [id number] DOB: September 05, 1928   Cancelled Treatment:    Reason Eval/Treat Not Completed: Patient at procedure or test/ unavailable. Pt currently in hemodialysis will re-attempt eval at a later date.  Almon Register [phone] 08/16/2016, 7:49 AM

## 2016-08-16 NOTE — Progress Notes (Signed)
Patient alert, verbally pleasant.  Denies discomfort, states right leg pain level at 4-5, tylenol given with effective results.  Right leg in knee immobilizer.  Removed this shift, with noted ace bandage covering light gauze.  Both removed, knee has intact steri strips and petroleum gauze.  No noted swelling, redness or drainage.  Mepilex bandage covering right upper leg/groin without drainage.  Right upper arm site for dialysis covered with dry dressing without noted drainage.  Resting in bed, with call light in reach.

## 2016-08-16 NOTE — Progress Notes (Signed)
   Assessment: 1 Day Post-Op  S/P Procedure(s) (LRB): INTRAMEDULLARY (IM) RETROGRADE FEMORAL NAILING (Right) by Dr. Ernesta Amble. Percell Miller on 08/15/16  Principal Problem:   Leg fracture Active Problems:   HTN (hypertension)   DM2 (diabetes mellitus, type 2) ([place])   Anemia   Chronic combined systolic and diastolic heart failure EF 45-50% by echo 06/25/2012   ESRD on dialysis ([place])   PAF (paroxysmal atrial fibrillation) (HCC) Acute Blood Loss Anemia, improved 08/16/16 after 1 unit PRBC 08/15/16.   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  Incentive Spirometry  Elevate and apply ice  Weight Bearing: Touch Down Weight Bearing (TDWB) RLE Dressings: Reinforce PRN.  VTE prophylaxis: Aspirin, SCDs, ambulation Dispo: Per primary.  Likely will be able to d/c to daughter's house where she has been staying.  She has a few stairs and a ramp.  PT eval pending.  Subjective: Patient reports pain as moderate. Pain controlled with IV and PO meds.  Tolerating diet. No CP, SOB.  Objective:   VITALS:   Vitals:   08/16/16 0532 08/16/16 0700 08/16/16 0715 08/16/16 0720  BP: (!) 121/47 (!) 99/42 (!) 111/36 (!) 110/42  Pulse: 67 67 67 64  Resp: 20 19 16 17   Temp: 99.1 F (37.3 C) 98.8 F (37.1 C)    TempSrc: Oral Oral    SpO2: 100% 100%    Weight:  58.3 kg (128 lb 8.5 oz)    Height:       CBC Latest Ref Rng & Units 08/16/2016 08/15/2016 08/14/2016  WBC 4.0 - 10.5 K/uL 7.0 9.5 9.2  Hemoglobin 12.0 - 15.0 g/dL 8.3(L) 7.8(L) 8.2(L)  Hematocrit 36.0 - 46.0 % 25.1(L) 23.9(L) 26.0(L)  Platelets 150 - 400 K/uL 238 292 334   BMP Latest Ref Rng & Units 08/16/2016 08/14/2016 08/13/2016  Glucose 65 - 99 mg/dL 119(H) 222(H) 222(H)  BUN 6 - 20 mg/dL 25(H) 29(H) 23(H)  Creatinine 0.44 - 1.00 mg/dL 5.63(H) 6.87(H) 5.50(H)  Sodium 135 - 145 mmol/L 135 140 141  Potassium 3.5 - 5.1 mmol/L 5.0 4.7 4.3  Chloride 101 - 111  mmol/L 100(L) 102 99(L)  CO2 22 - 32 mmol/L 23 25 29   Calcium 8.9 - 10.3 mg/dL 8.0(L) 7.8(L) 8.6(L)   Intake/Output      05/31 0701 - 06/01 0700 06/01 0701 - 06/02 0700   P.O.     I.V. (mL/kg) 250 (4.3)    Blood 335    IV Piggyback 50    Total Intake(mL/kg) 635 (10.9)    Urine (mL/kg/hr)     Other     Blood 50 (0)    Total Output 50     Net +585           Physical Exam: General: NAD.  Supine in bed watching TV in dialysis.  Pleasant, conversant. Resp: No increased wob ABD soft Neurologically intact MSK RLE: Neurovascularly intact Sensation intact distally Feet warm Dorsiflexion/Plantar flexion intact Incision: dressing C/D/I   Prudencio Burly III, PA-C 08/16/2016, 8:19 AM

## 2016-08-16 NOTE — Procedures (Signed)
I was present at this session.  I have reviewed the session itself and made appropriate changes.  HD via RUA avf. Needles too close so recirc.  bp low, below dry.   [name] L 6/1/20188:17 AM

## 2016-08-16 NOTE — Progress Notes (Signed)
PROGRESS NOTE    KANITRA PURIFOY  ZOX:[id number] DOB: Aug 16, 1928 DOA: 08/13/2016 PCP: Binnie Rail, MD   Brief Narrative: [name] is a 83 y.o. female with a medical history significant of DM; HLD; hypothyroidism; afib not on AC; chronic combined heart failure; HTN; ESRD on HD with prior fractures to right leg. She presented with distal femur and proximal tibial fractures. She underwent ORIF on 5/31.   Assessment & Plan:   Principal Problem:   Leg fracture Active Problems:   HTN (hypertension)   DM2 (diabetes mellitus, type 2) (HCC)   Anemia   Chronic combined systolic and diastolic heart failure EF 45-50% by echo 06/25/2012   ESRD on dialysis ([place])   PAF (paroxysmal atrial fibrillation) (HCC)   Distal femur fracture Proximal tibial fracture S/p ORIF today. Pain minimally controlled -PT eval -OT eval -CSW consult for placement (family wants SNF, not CIR) -start Tramadol 50mg  q12 hours prn -continue Fentanyl 72mcg prn for breakthrough -continueTylenol 650mg  q6hrs prn  ESRD MWF dialysis -nephrology recommendations  Essential hypertension -continue hydralazine and Bystolic  Diabetes mellitus Amaryl as an outpatient -continue SSI  Heart failure Last EF of 45-50%. Compensated.  Paroxysmal atrial fibrillation Not on Coumadin for years now. -continue Aspirin  ?skin infection w/ retained suture Started on doxycycline per orthopedic surgery   DVT prophylaxis: SCDs Code Status: Full code Family Communication: Daughter at bedside Disposition Plan: Discharge to SNF when medically stable   Consultants:   Orthopedic surgery  Procedures:   ORIF (08/15/16)  Antimicrobials:  None    Subjective: Continued leg pain.   Objective: Vitals:   08/16/16 0800 08/16/16 0830 08/16/16 0900 08/16/16 0930  BP: (!) 103/37 (!) 115/30 123/65 (!) (P) 100/44  Pulse: 65 62 62 (P) 64  Resp: 18 17 18  (P) 18  Temp:      TempSrc:      SpO2:      Weight:        Height:        Intake/Output Summary (Last 24 hours) at 08/16/16 1005 Last data filed at 08/15/16 2216  Gross per 24 hour  Intake              300 ml  Output               50 ml  Net              250 ml   Filed Weights   08/14/16 1300 08/14/16 1700 08/16/16 0700  Weight: 58.5 kg (128 lb 15.5 oz) 58.5 kg (128 lb 15.5 oz) 58.3 kg (128 lb 8.5 oz)    Examination:  General exam: Appears calm and comfortable Respiratory system: Clear to auscultation. Respiratory effort normal. Cardiovascular system: S1 & S2 heard, RRR. No murmurs. Gastrointestinal system: Abdomen is nondistended, soft and nontender. Normal bowel sounds heard. Central nervous system: Alert and oriented. No focal neurological deficits. Extremities: No edema. No calf tenderness Skin: No cyanosis. No rashes. Right leg with immobilizer in place Psychiatry: Judgement and insight appear normal. Mood & affect appropriate.   Exam unchanged from 08/15/16   Data Reviewed: I have personally reviewed following labs and imaging studies  CBC:  Recent Labs Lab 08/13/16 1639 08/14/16 1006 08/15/16 0724 08/16/16 0739  WBC 9.9 9.2 9.5 7.0  NEUTROABS 7.8*  --   --   --   HGB 8.9* 8.2* 7.8* 8.3*  HCT 28.5* 26.0* 23.9* 25.1*  MCV 94.7 93.9 95.2 90.6  PLT 381 334 292 238  Basic Metabolic Panel:  Recent Labs Lab 08/13/16 1639 08/14/16 1300 08/16/16 0740  NA 141 140 135  K 4.3 4.7 5.0  CL 99* 102 100*  CO2 29 25 23   GLUCOSE 222* 222* 119*  BUN 23* 29* 25*  CREATININE 5.50* 6.87* 5.63*  CALCIUM 8.6* 7.8* 8.0*  PHOS  --  5.2* 5.8*   GFR: Estimated Creatinine Clearance: 6 mL/min (A) (by C-G formula based on SCr of 5.63 mg/dL (H)). Liver Function Tests:  Recent Labs Lab 08/14/16 1300 08/16/16 0740  ALBUMIN 2.3* 2.2*   No results for input(s): LIPASE, AMYLASE in the last 168 hours. No results for input(s): AMMONIA in the last 168 hours. Coagulation Profile: No results for input(s): INR, PROTIME in the last  168 hours. Cardiac Enzymes: No results for input(s): CKTOTAL, CKMB, CKMBINDEX, TROPONINI in the last 168 hours. BNP (last 3 results) No results for input(s): PROBNP in the last 8760 hours. HbA1C:  Recent Labs  08/14/16 0041  HGBA1C 5.9*   CBG:  Recent Labs Lab 08/15/16 1112 08/15/16 1212 08/15/16 1623 08/15/16 2213 08/16/16 0624  GLUCAP 87 83 121* 154* 120*   Lipid Profile: No results for input(s): CHOL, HDL, LDLCALC, TRIG, CHOLHDL, LDLDIRECT in the last 72 hours. Thyroid Function Tests: No results for input(s): TSH, T4TOTAL, FREET4, T3FREE, THYROIDAB in the last 72 hours. Anemia Panel: No results for input(s): VITAMINB12, FOLATE, FERRITIN, TIBC, IRON, RETICCTPCT in the last 72 hours. Sepsis Labs: No results for input(s): PROCALCITON, LATICACIDVEN in the last 168 hours.  Recent Results (from the past 240 hour(s))  Surgical pcr screen     Status: None   Collection Time: 08/14/16 10:48 PM  Result Value Ref Range Status   MRSA, PCR NEGATIVE NEGATIVE Final   Staphylococcus aureus NEGATIVE NEGATIVE Final    Comment:        The Xpert SA Assay (FDA approved for NASAL specimens in patients over 62 years of age), is one component of a comprehensive surveillance program.  Test performance has been validated by [facility] for patients greater than or equal to 87 year old. It is not intended to diagnose infection nor to guide or monitor treatment.          Radiology Studies: Dg C-arm 1-60 Min  Result Date: 08/15/2016 CLINICAL DATA:  Right femur ORIF. EXAM: DG C-ARM 61-120 MIN; RIGHT FEMUR 2 VIEWS COMPARISON:  08/13/2016 FLUOROSCOPY TIME:  C-arm fluoroscopic images were obtained intraoperatively and submitted for post operative interpretation. Please see the performing provider's procedural report for the fluoroscopy time utilized. FINDINGS: Five intraoperative spot fluoroscopic images of the right femur are provided. There has been interval internal fixation of the  previously seen distal femoral metadiaphyseal fracture with placement of a retrograde intramedullary nail. Proximal and distal interlocking screws are present. Fracture angulation is decreased. Sequelae of prior tibial ORIF are partially visualized. IMPRESSION: Intraoperative images during ORIF of distal femur fracture. Electronically Signed   By: Logan Bores M.D.   On: 08/15/2016 10:56   Dg Femur, Min 2 Views Right  Result Date: 08/15/2016 CLINICAL DATA:  Right femur ORIF. EXAM: DG C-ARM 61-120 MIN; RIGHT FEMUR 2 VIEWS COMPARISON:  08/13/2016 FLUOROSCOPY TIME:  C-arm fluoroscopic images were obtained intraoperatively and submitted for post operative interpretation. Please see the performing provider's procedural report for the fluoroscopy time utilized. FINDINGS: Five intraoperative spot fluoroscopic images of the right femur are provided. There has been interval internal fixation of the previously seen distal femoral metadiaphyseal fracture with placement of a retrograde  intramedullary nail. Proximal and distal interlocking screws are present. Fracture angulation is decreased. Sequelae of prior tibial ORIF are partially visualized. IMPRESSION: Intraoperative images during ORIF of distal femur fracture. Electronically Signed   By: Logan Bores M.D.   On: 08/15/2016 10:56   Dg Femur Port, Min 2 Views Right  Result Date: 08/15/2016 CLINICAL DATA:  Postop ORIF of right femur. EXAM: RIGHT FEMUR PORTABLE 2 VIEW COMPARISON:  5/29/8 FINDINGS: Right femur IM nail and screws are identified. There has been interval fixation of the comminuted distal femur fracture. The fracture fragments appear to be in anatomic alignment. IMPRESSION: 1. Status post ORIF of distal right femur fracture. Electronically Signed   By: Kerby Moors M.D.   On: 08/15/2016 12:01        Scheduled Meds: . aspirin  325 mg Oral Daily  . calcitRIOL  0.5 mcg Oral Q M,W,F-HD  . darbepoetin (ARANESP) injection - DIALYSIS  200 mcg  Intravenous Q Fri-HD  . doxycycline  100 mg Oral Q12H  . feeding supplement (PRO-STAT SUGAR FREE 64)  30 mL Oral BID  . hydrALAZINE  50 mg Oral BID  . insulin aspart  0-5 Units Subcutaneous QHS  . insulin aspart  0-9 Units Subcutaneous TID WC  . isosorbide mononitrate  30 mg Oral Daily  . levothyroxine  75 mcg Oral QAC breakfast  . multivitamin  1 tablet Oral QHS  . nebivolol  2.5 mg Oral QHS  . pravastatin  20 mg Oral q1800  . sevelamer carbonate  1,600 mg Oral TID WC   Continuous Infusions: . sodium chloride    . sodium chloride    . sodium chloride    . methocarbamol (ROBAXIN)  IV       LOS: 3 days     Cordelia Poche, MD [facility] 08/16/2016, 10:05 AM Pager: [phone]  If 7PM-7AM, please contact night-coverage [email or web address] Password [email or web address] 08/16/2016, 10:05 AM

## 2016-08-16 NOTE — Progress Notes (Signed)
PT Cancellation Note  Patient Details Name: [name] MRN: [id number] DOB: [birth year]   Cancelled Treatment:    Reason Eval/Treat Not Completed: Patient at procedure or test/unavailable. Pt in HD. PT will continue to f/u with pt as available.    [name] 08/16/2016, 8:01 AM

## 2016-08-16 NOTE — Progress Notes (Signed)
Subjective: Interval History: has complaints pain but tolerable.  Objective: Vital signs in last 24 hours: Temp:  [97.7 F (36.5 C)-99.5 F (37.5 C)] 98.8 F (37.1 C) (06/01 0700) Pulse Rate:  [51-67] 64 (06/01 0720) Resp:  [16-25] 17 (06/01 0720) BP: (99-159)/(35-47) 110/42 (06/01 0720) SpO2:  [96 %-100 %] 100 % (06/01 0700) Weight:  [58.3 kg (128 lb 8.5 oz)] 58.3 kg (128 lb 8.5 oz) (06/01 0700) Weight change: -0.2 kg (-7.1 oz)  Intake/Output from previous day: 05/31 0701 - 06/01 0700 In: 635 [I.V.:250; Blood:335; IV Piggyback:50] Out: 50 [Blood:50] Intake/Output this shift: No intake/output data recorded.  General appearance: alert, cooperative and no distress Resp: diminished breath sounds bilaterally and rhonchi bibasilar Cardio: S1, S2 normal and systolic murmur: systolic ejection 2/6, decrescendo at 2nd left intercostal space GI: soft, non-tender; bowel sounds normal; no masses,  no organomegaly Extremities: AVF RUA, immobilizer R LE.  Lab Results:  Recent Labs  08/15/16 0724 08/16/16 0739  WBC 9.5 7.0  HGB 7.8* 8.3*  HCT 23.9* 25.1*  PLT 292 238   BMET:  Recent Labs  08/14/16 1300 08/16/16 0740  NA 140 135  K 4.7 5.0  CL 102 100*  CO2 25 23  GLUCOSE 222* 119*  BUN 29* 25*  CREATININE 6.87* 5.63*  CALCIUM 7.8* 8.0*   No results for input(s): PTH in the last 72 hours. Iron Studies: No results for input(s): IRON, TIBC, TRANSFERRIN, FERRITIN in the last 72 hours.  Studies/Results: Dg Pelvis 1-2 Views  Result Date: 08/14/2016 CLINICAL DATA:  Fall yesterday with right hip pain, initial encounter EXAM: PELVIS - 1-2 VIEW COMPARISON:  None. FINDINGS: Pelvic ring is intact. Degenerative changes of the hip joints are noted bilaterally. No soft tissue abnormality is seen. Calcified uterine fibroids noted. IMPRESSION: No acute abnormality seen. Electronically Signed   By: Inez Catalina M.D.   On: 08/14/2016 09:45   Dg C-arm 1-60 Min  Result Date:  08/15/2016 CLINICAL DATA:  Right femur ORIF. EXAM: DG C-ARM 61-120 MIN; RIGHT FEMUR 2 VIEWS COMPARISON:  08/13/2016 FLUOROSCOPY TIME:  C-arm fluoroscopic images were obtained intraoperatively and submitted for post operative interpretation. Please see the performing provider's procedural report for the fluoroscopy time utilized. FINDINGS: Five intraoperative spot fluoroscopic images of the right femur are provided. There has been interval internal fixation of the previously seen distal femoral metadiaphyseal fracture with placement of a retrograde intramedullary nail. Proximal and distal interlocking screws are present. Fracture angulation is decreased. Sequelae of prior tibial ORIF are partially visualized. IMPRESSION: Intraoperative images during ORIF of distal femur fracture. Electronically Signed   By: Logan Bores M.D.   On: 08/15/2016 10:56   Dg Femur, Min 2 Views Right  Result Date: 08/15/2016 CLINICAL DATA:  Right femur ORIF. EXAM: DG C-ARM 61-120 MIN; RIGHT FEMUR 2 VIEWS COMPARISON:  08/13/2016 FLUOROSCOPY TIME:  C-arm fluoroscopic images were obtained intraoperatively and submitted for post operative interpretation. Please see the performing provider's procedural report for the fluoroscopy time utilized. FINDINGS: Five intraoperative spot fluoroscopic images of the right femur are provided. There has been interval internal fixation of the previously seen distal femoral metadiaphyseal fracture with placement of a retrograde intramedullary nail. Proximal and distal interlocking screws are present. Fracture angulation is decreased. Sequelae of prior tibial ORIF are partially visualized. IMPRESSION: Intraoperative images during ORIF of distal femur fracture. Electronically Signed   By: Logan Bores M.D.   On: 08/15/2016 10:56   Dg Femur Port, Min 2 Views Right  Result Date: 08/15/2016  CLINICAL DATA:  Postop ORIF of right femur. EXAM: RIGHT FEMUR PORTABLE 2 VIEW COMPARISON:  5/29/8 FINDINGS: Right femur  IM nail and screws are identified. There has been interval fixation of the comminuted distal femur fracture. The fracture fragments appear to be in anatomic alignment. IMPRESSION: 1. Status post ORIF of distal right femur fracture. Electronically Signed   By: Kerby Moors M.D.   On: 08/15/2016 12:01    I have reviewed the patient's current medications.  Assessment/Plan: 1 ESRD on Hd.  Vol ok.  2 Anemia needs esa, check Fe 3 Hx afib, low dose beta blocker 4 tib/fib fx per ortho 5 DM controlled 6 HPTH vit D P  HD , esa, check Fe, PT    LOS: 3 days   [name] L 08/16/2016,8:18 AM

## 2016-08-17 DIAGNOSIS — E1122 Type 2 diabetes mellitus with diabetic chronic kidney disease: Secondary | ICD-10-CM | POA: Diagnosis not present

## 2016-08-17 DIAGNOSIS — S72324A Nondisplaced transverse fracture of shaft of right femur, initial encounter for closed fracture: Secondary | ICD-10-CM | POA: Diagnosis not present

## 2016-08-17 DIAGNOSIS — L97819 Non-pressure chronic ulcer of other part of right lower leg with unspecified severity: Secondary | ICD-10-CM | POA: Diagnosis not present

## 2016-08-17 DIAGNOSIS — R531 Weakness: Secondary | ICD-10-CM | POA: Diagnosis not present

## 2016-08-17 DIAGNOSIS — Z992 Dependence on renal dialysis: Secondary | ICD-10-CM | POA: Diagnosis not present

## 2016-08-17 DIAGNOSIS — G8911 Acute pain due to trauma: Secondary | ICD-10-CM | POA: Diagnosis not present

## 2016-08-17 DIAGNOSIS — I10 Essential (primary) hypertension: Secondary | ICD-10-CM | POA: Diagnosis not present

## 2016-08-17 DIAGNOSIS — N2581 Secondary hyperparathyroidism of renal origin: Secondary | ICD-10-CM | POA: Diagnosis not present

## 2016-08-17 DIAGNOSIS — S72451A Displaced supracondylar fracture without intracondylar extension of lower end of right femur, initial encounter for closed fracture: Secondary | ICD-10-CM | POA: Diagnosis not present

## 2016-08-17 DIAGNOSIS — W109XXA Fall (on) (from) unspecified stairs and steps, initial encounter: Secondary | ICD-10-CM | POA: Diagnosis not present

## 2016-08-17 DIAGNOSIS — S7291XD Unspecified fracture of right femur, subsequent encounter for closed fracture with routine healing: Secondary | ICD-10-CM | POA: Diagnosis not present

## 2016-08-17 DIAGNOSIS — E119 Type 2 diabetes mellitus without complications: Secondary | ICD-10-CM | POA: Diagnosis not present

## 2016-08-17 DIAGNOSIS — Z23 Encounter for immunization: Secondary | ICD-10-CM | POA: Diagnosis not present

## 2016-08-17 DIAGNOSIS — S72324D Nondisplaced transverse fracture of shaft of right femur, subsequent encounter for closed fracture with routine healing: Secondary | ICD-10-CM | POA: Diagnosis not present

## 2016-08-17 DIAGNOSIS — E039 Hypothyroidism, unspecified: Secondary | ICD-10-CM | POA: Diagnosis not present

## 2016-08-17 DIAGNOSIS — D649 Anemia, unspecified: Secondary | ICD-10-CM | POA: Diagnosis not present

## 2016-08-17 DIAGNOSIS — D631 Anemia in chronic kidney disease: Secondary | ICD-10-CM | POA: Diagnosis not present

## 2016-08-17 DIAGNOSIS — N186 End stage renal disease: Secondary | ICD-10-CM | POA: Diagnosis not present

## 2016-08-17 DIAGNOSIS — L97129 Non-pressure chronic ulcer of left thigh with unspecified severity: Secondary | ICD-10-CM | POA: Diagnosis not present

## 2016-08-17 DIAGNOSIS — I480 Paroxysmal atrial fibrillation: Secondary | ICD-10-CM | POA: Diagnosis not present

## 2016-08-17 DIAGNOSIS — Y92018 Other place in single-family (private) house as the place of occurrence of the external cause: Secondary | ICD-10-CM | POA: Diagnosis not present

## 2016-08-17 DIAGNOSIS — D509 Iron deficiency anemia, unspecified: Secondary | ICD-10-CM | POA: Diagnosis not present

## 2016-08-17 DIAGNOSIS — R262 Difficulty in walking, not elsewhere classified: Secondary | ICD-10-CM | POA: Diagnosis not present

## 2016-08-17 DIAGNOSIS — Z4789 Encounter for other orthopedic aftercare: Secondary | ICD-10-CM | POA: Diagnosis not present

## 2016-08-17 DIAGNOSIS — N184 Chronic kidney disease, stage 4 (severe): Secondary | ICD-10-CM | POA: Diagnosis not present

## 2016-08-17 DIAGNOSIS — M6281 Muscle weakness (generalized): Secondary | ICD-10-CM | POA: Diagnosis not present

## 2016-08-17 DIAGNOSIS — I70219 Atherosclerosis of native arteries of extremities with intermittent claudication, unspecified extremity: Secondary | ICD-10-CM | POA: Diagnosis not present

## 2016-08-17 DIAGNOSIS — S72451D Displaced supracondylar fracture without intracondylar extension of lower end of right femur, subsequent encounter for closed fracture with routine healing: Secondary | ICD-10-CM | POA: Diagnosis not present

## 2016-08-17 DIAGNOSIS — I5042 Chronic combined systolic (congestive) and diastolic (congestive) heart failure: Secondary | ICD-10-CM | POA: Diagnosis not present

## 2016-08-17 DIAGNOSIS — X58XXXA Exposure to other specified factors, initial encounter: Secondary | ICD-10-CM | POA: Diagnosis not present

## 2016-08-17 DIAGNOSIS — S82191D Other fracture of upper end of right tibia, subsequent encounter for closed fracture with routine healing: Secondary | ICD-10-CM | POA: Diagnosis not present

## 2016-08-17 DIAGNOSIS — R609 Edema, unspecified: Secondary | ICD-10-CM | POA: Diagnosis not present

## 2016-08-17 DIAGNOSIS — S728X9A Other fracture of unspecified femur, initial encounter for closed fracture: Secondary | ICD-10-CM | POA: Diagnosis not present

## 2016-08-17 DIAGNOSIS — M79604 Pain in right leg: Secondary | ICD-10-CM | POA: Diagnosis not present

## 2016-08-17 DIAGNOSIS — I120 Hypertensive chronic kidney disease with stage 5 chronic kidney disease or end stage renal disease: Secondary | ICD-10-CM | POA: Diagnosis not present

## 2016-08-17 LAB — GLUCOSE, CAPILLARY
Glucose-Capillary: 198 mg/dL — ABNORMAL HIGH (ref 65–99)
Glucose-Capillary: 132 mg/dL — ABNORMAL HIGH (ref 65–99)

## 2016-08-17 MED ORDER — NEPRO/CARBSTEADY PO LIQD: 237.0000 mL | Freq: Three times a day (TID) | ORAL | 0 refills | Status: DC | PRN

## 2016-08-17 MED ORDER — METHOCARBAMOL 500 MG PO TABS: 500.0000 mg | ORAL_TABLET | Freq: Four times a day (QID) | ORAL | Status: DC | PRN

## 2016-08-17 MED ORDER — SODIUM CHLORIDE 0.9 % IV SOLN: 125.0000 mg | INTRAVENOUS | Status: DC

## 2016-08-17 MED ORDER — ACETAMINOPHEN 325 MG PO TABS: 650.0000 mg | ORAL_TABLET | Freq: Four times a day (QID) | ORAL | Status: DC | PRN

## 2016-08-17 MED ORDER — PRO-STAT SUGAR FREE PO LIQD: 30.0000 mL | Freq: Two times a day (BID) | ORAL | Status: DC

## 2016-08-17 MED ORDER — HYDRALAZINE HCL 25 MG PO TABS: 25.0000 mg | ORAL_TABLET | Freq: Two times a day (BID) | ORAL | Status: DC

## 2016-08-17 MED ORDER — SEVELAMER CARBONATE 800 MG PO TABS
2400.0000 mg | ORAL_TABLET | Freq: Three times a day (TID) | ORAL | Status: DC
  Administered 2016-08-17: 2400 mg via ORAL
  Filled 2016-08-17: qty 3

## 2016-08-17 NOTE — Progress Notes (Signed)
Subjective: Interval History: has no complaint .  Objective: Vital signs in last 24 hours: Temp:  [98.1 F (36.7 C)-100.8 F (38.2 C)] 99.2 F (37.3 C) (06/02 0525) Pulse Rate:  [62-73] 68 (06/02 0525) Resp:  [17-18] 18 (06/02 0525) BP: (98-151)/(18-65) 151/49 (06/02 0525) SpO2:  [95 %-100 %] 100 % (06/02 0525) Weight:  [58.3 kg (128 lb 8.5 oz)] 58.3 kg (128 lb 8.5 oz) (06/01 1045) Weight change: 0 kg (0 lb)  Intake/Output from previous day: No intake/output data recorded. Intake/Output this shift: No intake/output data recorded.  General appearance: alert, cooperative and no distress Resp: clear to auscultation bilaterally Cardio: S1, S2 normal and systolic murmur: holosystolic 2/6, blowing at apex GI: soft, non-tender; bowel sounds normal; no masses,  no organomegaly Extremities: AVF RUA,  immob on RLE  Lab Results:  Recent Labs  08/15/16 0724 08/16/16 0739  WBC 9.5 7.0  HGB 7.8* 8.3*  HCT 23.9* 25.1*  PLT 292 238   BMET:  Recent Labs  08/14/16 1300 08/16/16 0740  NA 140 135  K 4.7 5.0  CL 102 100*  CO2 25 23  GLUCOSE 222* 119*  BUN 29* 25*  CREATININE 6.87* 5.63*  CALCIUM 7.8* 8.0*   No results for input(s): PTH in the last 72 hours. Iron Studies:  Recent Labs  08/16/16 0820  IRON 12*  TIBC 143*    Studies/Results: Dg C-arm 1-60 Min  Result Date: 08/15/2016 CLINICAL DATA:  Right femur ORIF. EXAM: DG C-ARM 61-120 MIN; RIGHT FEMUR 2 VIEWS COMPARISON:  08/13/2016 FLUOROSCOPY TIME:  C-arm fluoroscopic images were obtained intraoperatively and submitted for post operative interpretation. Please see the performing provider's procedural report for the fluoroscopy time utilized. FINDINGS: Five intraoperative spot fluoroscopic images of the right femur are provided. There has been interval internal fixation of the previously seen distal femoral metadiaphyseal fracture with placement of a retrograde intramedullary nail. Proximal and distal interlocking screws  are present. Fracture angulation is decreased. Sequelae of prior tibial ORIF are partially visualized. IMPRESSION: Intraoperative images during ORIF of distal femur fracture. Electronically Signed   By: Logan Bores M.D.   On: 08/15/2016 10:56   Dg Femur, Min 2 Views Right  Result Date: 08/15/2016 CLINICAL DATA:  Right femur ORIF. EXAM: DG C-ARM 61-120 MIN; RIGHT FEMUR 2 VIEWS COMPARISON:  08/13/2016 FLUOROSCOPY TIME:  C-arm fluoroscopic images were obtained intraoperatively and submitted for post operative interpretation. Please see the performing provider's procedural report for the fluoroscopy time utilized. FINDINGS: Five intraoperative spot fluoroscopic images of the right femur are provided. There has been interval internal fixation of the previously seen distal femoral metadiaphyseal fracture with placement of a retrograde intramedullary nail. Proximal and distal interlocking screws are present. Fracture angulation is decreased. Sequelae of prior tibial ORIF are partially visualized. IMPRESSION: Intraoperative images during ORIF of distal femur fracture. Electronically Signed   By: Logan Bores M.D.   On: 08/15/2016 10:56   Dg Femur Port, Min 2 Views Right  Result Date: 08/15/2016 CLINICAL DATA:  Postop ORIF of right femur. EXAM: RIGHT FEMUR PORTABLE 2 VIEW COMPARISON:  5/29/8 FINDINGS: Right femur IM nail and screws are identified. There has been interval fixation of the comminuted distal femur fracture. The fracture fragments appear to be in anatomic alignment. IMPRESSION: 1. Status post ORIF of distal right femur fracture. Electronically Signed   By: Kerby Moors M.D.   On: 08/15/2016 12:01    I have reviewed the patient's current medications.  Assessment/Plan: 1 ESRD below dry, bp low,  lower bp meds, lower dry 2 Anemia Fe low give iv Fe 3 Tib/fib fx per Ortho 4 HTN lower meds  5 DM controlled 6 HPTH vit D P HD MWF, lower dry, lower hydral, esa, iv Fe, PT   LOS: 4 days    [name] L 08/17/2016,8:36 AM

## 2016-08-17 NOTE — Progress Notes (Signed)
Report given to [facility] care.

## 2016-08-17 NOTE — Discharge Summary (Signed)
Physician Discharge Summary  [name] PXT:[id number] DOB: October 16, 1928 DOA: 08/13/2016  PCP: Binnie Rail, MD  Admit date: 08/13/2016 Discharge date: 08/17/2016  Admitted From: Home Disposition: SNF  Recommendations for Outpatient Follow-up:  1. Follow up with PCP in 1 week 2. Follow up with orthopedic surgery 3. Recommend outpatient ABIs to assess right leg   Discharge Condition: Stable CODE STATUS: Full code Diet recommendation: Renal/carb modified   Brief/Interim Summary:  Admission HPI written by Karmen Bongo, MD   Chief Complaint: fall  HPI: [name] is a 83 y.o. female with medical history significant of DM; HLD; hypothyroidism; afib not on AC; chronic combined heart failure; HTN; ESRD on HD with prior fractures to right leg presenting with knee fracture after a mechanical fall.  Patient reports that she "just fell".  She was coming down the steps outside and her foot caught in the mat.  She landed on her right knee and then fell over.  Severe pain.  Only injury was the knee.  Had a knee fracture (tib/fib)  there in the past.  Had a skin graft last year but it failed - had formed an eschar and she was sent to [facility] for the skin grafting.  She refused to go back and has been putting her own dressing on it.  It did heal but it broke through again.     ED Course: mechanical fall with knee fracture; ortho consult and they will see patient.     Hospital course:  Distal femur fracture Proximal tibial fracture Patient sustained fracture after a fall. She underwent ORIF on 08/15/16. PT recommending SNF. Pain management achieved with Norco and Tylenol (Maximum acetaminophen daily dose of 3000mg ). Fentanyl weaned prior to discharge.  ESRD MWF dialysis.  Essential hypertension Continued hydralazine and Bystolic  Diabetes mellitus Amaryl as an outpatient. Resume.  Heart failure Last EF of 45-50%. Compensated.  Paroxysmal atrial fibrillation Not on  Coumadin for years now. Aspirin on discharge.  Skin infection w/ retained suture Started on doxycycline per orthopedic surgery. Continue on discharge for a two week course.  Discharge Diagnoses:  Principal Problem:   Leg fracture Active Problems:   HTN (hypertension)   DM2 (diabetes mellitus, type 2) (HCC)   Anemia   Chronic combined systolic and diastolic heart failure EF 45-50% by echo 06/25/2012   ESRD on dialysis ([place])   PAF (paroxysmal atrial fibrillation) ([place])    Discharge Instructions   Allergies as of 08/17/2016      Reactions   Codeine Shortness Of Breath, Swelling, Other (See Comments)   Swelling of the tongue. Pt tolerates Vicodin.   Coreg [carvedilol] Other (See Comments)   Headaches   Lopressor [metoprolol Tartrate] Other (See Comments)   GI upset   Metoprolol Other (See Comments)   GI upset   Oxycodone Other (See Comments)   Hallucinations (also with Oxycontin)      Medication List    TAKE these medications   acetaminophen 325 MG tablet Commonly known as:  TYLENOL Take 2 tablets (650 mg total) by mouth every 6 (six) hours as needed for mild pain or moderate pain.   aspirin EC 325 MG tablet Take 1 tablet (325 mg total) by mouth daily. For 30 days post op for DVT Prophylaxis.  Resume 81 mg Aspirin after this has been completed. What changed:  medication strength  how much to take  additional instructions   b complex-vitamin c-folic acid 0.8 MG Tabs tablet Take 1 tablet by mouth at  bedtime.   darbepoetin 100 MCG/0.5ML Soln injection Commonly known as:  ARANESP Inject 0.5 mLs (100 mcg total) into the vein every Wednesday with hemodialysis.   diclofenac sodium 1 % Gel Commonly known as:  VOLTAREN Apply 2 g topically 3 (three) times daily.   doxycycline 100 MG capsule Commonly known as:  VIBRAMYCIN Take 1 capsule (100 mg total) by mouth 2 (two) times daily.   feeding supplement (NEPRO CARB STEADY) Liqd Take 237 mLs by mouth 3 (three) times  daily as needed (Supplement).   feeding supplement (PRO-STAT SUGAR FREE 64) Liqd Take 30 mLs by mouth 2 (two) times daily.   glimepiride 1 MG tablet Commonly known as:  AMARYL TAKE 3 TABLETS(3 MG) BY MOUTH DAILY WITH BREAKFAST What changed:  See the new instructions.   hydrALAZINE 50 MG tablet Commonly known as:  APRESOLINE TAKE 1 TABLET(50 MG) BY MOUTH TWICE DAILY What changed:  See the new instructions.   HYDROcodone-acetaminophen 5-325 MG tablet Commonly known as:  NORCO Take 1-2 tablets by mouth every 6 (six) hours as needed for moderate pain.   isosorbide mononitrate 30 MG 24 hr tablet Commonly known as:  IMDUR Take 1 tablet (30 mg total) by mouth daily.   levothyroxine 75 MCG tablet Commonly known as:  SYNTHROID, LEVOTHROID Take 1 tablet (75 mcg total) by mouth daily before breakfast. --- Office visit needed for further refills What changed:  additional instructions   methocarbamol 500 MG tablet Commonly known as:  ROBAXIN Take 1 tablet (500 mg total) by mouth every 6 (six) hours as needed for muscle spasms.   nebivolol 10 MG tablet Commonly known as:  BYSTOLIC Take 1 tablet (10 mg total) by mouth daily.   omeprazole 20 MG capsule Commonly known as:  PRILOSEC Take 1 capsule (20 mg total) by mouth daily. While taking anti inflammatory medicine daily   pravastatin 20 MG tablet Commonly known as:  PRAVACHOL Take 1 tablet (20 mg total) by mouth daily at 6 PM.   sevelamer carbonate 800 MG tablet Commonly known as:  RENVELA Take 1 tablet (800 mg total) by mouth 3 (three) times daily with meals.   VITAMIN D-3 PO Take 1 capsule by mouth daily.   vitamin E 200 UNIT capsule Take 200 Units by mouth daily.            Durable Medical Equipment        Start     Ordered   08/16/16 1456  For home use only DME 4 wheeled rolling walker with seat  Once    Question:  Patient needs a walker to treat with the following condition  Answer:  Femur fracture ([place])    08/16/16 1456      Contact information for follow-up providers    Renette Butters, MD Follow up in 1 week(s).   Specialty:  Orthopedic Surgery Contact information: [address] [place] [address] [phone]        Binnie Rail, MD. Schedule an appointment as soon as possible for a visit in 1 week(s).   Specialty:  Internal Medicine Contact information: [place] [address] [phone]            Contact information for after-discharge care    [facility] Follow up.   Specialty:  Skilled Nursing Engineer, manufacturing information: [address] [place] [phone]  Allergies  Allergen Reactions  . Codeine Shortness Of Breath, Swelling and Other (See Comments)    Swelling of the tongue. Pt tolerates Vicodin.  . Coreg [Carvedilol] Other (See Comments)    Headaches   . Lopressor [Metoprolol Tartrate] Other (See Comments)    GI upset  . Metoprolol Other (See Comments)    GI upset  . Oxycodone Other (See Comments)    Hallucinations (also with Oxycontin)    Consultations:  Orthopedic surgery   Procedures/Studies: Dg Pelvis 1-2 Views  Result Date: 08/14/2016 CLINICAL DATA:  Fall yesterday with right hip pain, initial encounter EXAM: PELVIS - 1-2 VIEW COMPARISON:  None. FINDINGS: Pelvic ring is intact. Degenerative changes of the hip joints are noted bilaterally. No soft tissue abnormality is seen. Calcified uterine fibroids noted. IMPRESSION: No acute abnormality seen. Electronically Signed   By: Inez Catalina M.D.   On: 08/14/2016 09:45   Chest Portable 1 View  Result Date: 08/14/2016 CLINICAL DATA:  83 year old female with leg fracture. Preop evaluation. EXAM: PORTABLE CHEST 1 VIEW COMPARISON:  Chest CT dated 12/02/2013 FINDINGS: The lungs are clear. There is no pleural effusion or pneumothorax. Top-normal cardiac size. There is atherosclerotic  calcification of the thoracic aorta. No acute osseous pathology. Right rotator cuff surgery and humeral head anchor pins. Partially visualized vascular stent in the right axilla. IMPRESSION: No active disease. Electronically Signed   By: Anner Crete M.D.   On: 08/14/2016 02:17   Dg Knee Complete 4 Views Right  Result Date: 08/13/2016 CLINICAL DATA:  Fall.  History of the surgery. EXAM: RIGHT KNEE - COMPLETE 4+ VIEW COMPARISON:  07/10/2011. FINDINGS: Prior ORIF right tibia. An acute fracture of the proximal tibia appears to be present. Acute fracture of the distal femoral shaft is present. Fractures are slightly separated. IMPRESSION: 1.  Acute fractures of the distal femur. 2. Prior ORIF right tibia. Superimposed acute fracture appears to be present. Both femoral and tibial fractures are slightly separated. Electronically Signed   By: Marcello Moores  Register   On: 08/13/2016 13:27   Dg C-arm 1-60 Min  Result Date: 08/15/2016 CLINICAL DATA:  Right femur ORIF. EXAM: DG C-ARM 61-120 MIN; RIGHT FEMUR 2 VIEWS COMPARISON:  08/13/2016 FLUOROSCOPY TIME:  C-arm fluoroscopic images were obtained intraoperatively and submitted for post operative interpretation. Please see the performing provider's procedural report for the fluoroscopy time utilized. FINDINGS: Five intraoperative spot fluoroscopic images of the right femur are provided. There has been interval internal fixation of the previously seen distal femoral metadiaphyseal fracture with placement of a retrograde intramedullary nail. Proximal and distal interlocking screws are present. Fracture angulation is decreased. Sequelae of prior tibial ORIF are partially visualized. IMPRESSION: Intraoperative images during ORIF of distal femur fracture. Electronically Signed   By: Logan Bores M.D.   On: 08/15/2016 10:56   Dg Femur, Min 2 Views Right  Result Date: 08/15/2016 CLINICAL DATA:  Right femur ORIF. EXAM: DG C-ARM 61-120 MIN; RIGHT FEMUR 2 VIEWS COMPARISON:   08/13/2016 FLUOROSCOPY TIME:  C-arm fluoroscopic images were obtained intraoperatively and submitted for post operative interpretation. Please see the performing provider's procedural report for the fluoroscopy time utilized. FINDINGS: Five intraoperative spot fluoroscopic images of the right femur are provided. There has been interval internal fixation of the previously seen distal femoral metadiaphyseal fracture with placement of a retrograde intramedullary nail. Proximal and distal interlocking screws are present. Fracture angulation is decreased. Sequelae of prior tibial ORIF are partially visualized. IMPRESSION: Intraoperative images during ORIF of distal femur fracture. Electronically  Signed   By: Logan Bores M.D.   On: 08/15/2016 10:56   Dg Femur Port, Min 2 Views Right  Result Date: 08/15/2016 CLINICAL DATA:  Postop ORIF of right femur. EXAM: RIGHT FEMUR PORTABLE 2 VIEW COMPARISON:  5/29/8 FINDINGS: Right femur IM nail and screws are identified. There has been interval fixation of the comminuted distal femur fracture. The fracture fragments appear to be in anatomic alignment. IMPRESSION: 1. Status post ORIF of distal right femur fracture. Electronically Signed   By: Kerby Moors M.D.   On: 08/15/2016 12:01      Subjective: Patient reports intermittent sharp shooting pain in her right leg  Discharge Exam: Vitals:   08/17/16 0525 08/17/16 1023  BP: (!) 151/49 (!) 128/48  Pulse: 68 69  Resp: 18 16  Temp: 99.2 F (37.3 C) 98.9 F (37.2 C)   Vitals:   08/16/16 2102 08/17/16 0043 08/17/16 0525 08/17/16 1023  BP: (!) 135/35 (!) 135/42 (!) 151/49 (!) 128/48  Pulse: 69 71 68 69  Resp: 18 18 18 16   Temp: 98.5 F (36.9 C) 99.2 F (37.3 C) 99.2 F (37.3 C) 98.9 F (37.2 C)  TempSrc: Oral Oral Oral Oral  SpO2: 100% 100% 100% 97%  Weight:      Height:        General: Pt is alert, awake, not in acute distress Cardiovascular: RRR, S1/S2 +, no rubs, no gallops. Could not palpate  right DP pulse Respiratory: CTA bilaterally, no wheezing, no rhonchi Abdominal: Soft, NT, ND, bowel sounds + Extremities: no edema, no cyanosis. Right leg immobilizer    The results of significant diagnostics from this hospitalization (including imaging, microbiology, ancillary and laboratory) are listed below for reference.     Microbiology: Recent Results (from the past 240 hour(s))  Surgical pcr screen     Status: None   Collection Time: 08/14/16 10:48 PM  Result Value Ref Range Status   MRSA, PCR NEGATIVE NEGATIVE Final   Staphylococcus aureus NEGATIVE NEGATIVE Final    Comment:        The Xpert SA Assay (FDA approved for NASAL specimens in patients over 19 years of age), is one component of a comprehensive surveillance program.  Test performance has been validated by [facility] for patients greater than or equal to 33 year old. It is not intended to diagnose infection nor to guide or monitor treatment.      Labs: BNP (last 3 results) No results for input(s): BNP in the last 8760 hours. Basic Metabolic Panel:  Recent Labs Lab 08/13/16 1639 08/14/16 1300 08/16/16 0740  NA 141 140 135  K 4.3 4.7 5.0  CL 99* 102 100*  CO2 29 25 23   GLUCOSE 222* 222* 119*  BUN 23* 29* 25*  CREATININE 5.50* 6.87* 5.63*  CALCIUM 8.6* 7.8* 8.0*  PHOS  --  5.2* 5.8*   Liver Function Tests:  Recent Labs Lab 08/14/16 1300 08/16/16 0740  ALBUMIN 2.3* 2.2*   No results for input(s): LIPASE, AMYLASE in the last 168 hours. No results for input(s): AMMONIA in the last 168 hours. CBC:  Recent Labs Lab 08/13/16 1639 08/14/16 1006 08/15/16 0724 08/16/16 0739  WBC 9.9 9.2 9.5 7.0  NEUTROABS 7.8*  --   --   --   HGB 8.9* 8.2* 7.8* 8.3*  HCT 28.5* 26.0* 23.9* 25.1*  MCV 94.7 93.9 95.2 90.6  PLT 381 334 292 238   Cardiac Enzymes: No results for input(s): CKTOTAL, CKMB, CKMBINDEX, TROPONINI in the last  168 hours. BNP: Invalid input(s): POCBNP CBG:  Recent Labs Lab  08/16/16 1145 08/16/16 1649 08/16/16 2104 08/17/16 0558 08/17/16 1206  GLUCAP 162* 158* 188* 198* 132*   D-Dimer No results for input(s): DDIMER in the last 72 hours. Hgb A1c No results for input(s): HGBA1C in the last 72 hours. Lipid Profile No results for input(s): CHOL, HDL, LDLCALC, TRIG, CHOLHDL, LDLDIRECT in the last 72 hours. Thyroid function studies No results for input(s): TSH, T4TOTAL, T3FREE, THYROIDAB in the last 72 hours.  Invalid input(s): FREET3 Anemia work up  Recent Labs  08/16/16 0820  TIBC 143*  IRON 12*   Urinalysis    Component Value Date/Time   COLORURINE YELLOW 06/16/2012 0717   APPEARANCEUR HAZY (A) 06/16/2012 0717   LABSPEC 1.015 06/16/2012 0717   PHURINE 6.0 06/16/2012 0717   GLUCOSEU 250 (A) 06/16/2012 0717   HGBUR SMALL (A) 06/16/2012 0717   BILIRUBINUR NEGATIVE 06/16/2012 0717   KETONESUR NEGATIVE 06/16/2012 0717   PROTEINUR 100 (A) 06/16/2012 0717   UROBILINOGEN 0.2 06/16/2012 0717   NITRITE NEGATIVE 06/16/2012 0717   LEUKOCYTESUR TRACE (A) 06/16/2012 0717   Sepsis Labs Invalid input(s): PROCALCITONIN,  WBC,  LACTICIDVEN Microbiology Recent Results (from the past 240 hour(s))  Surgical pcr screen     Status: None   Collection Time: 08/14/16 10:48 PM  Result Value Ref Range Status   MRSA, PCR NEGATIVE NEGATIVE Final   Staphylococcus aureus NEGATIVE NEGATIVE Final    Comment:        The Xpert SA Assay (FDA approved for NASAL specimens in patients over 29 years of age), is one component of a comprehensive surveillance program.  Test performance has been validated by [facility] for patients greater than or equal to 25 year old. It is not intended to diagnose infection nor to guide or monitor treatment.      Time coordinating discharge: Over 30 minutes  SIGNED:   Cordelia Poche, MD [facility] 08/17/2016, 1:41 PM Pager [phone]  If 7PM-7AM, please contact night-coverage [email or web address] Password [email or web address]

## 2016-08-17 NOTE — Evaluation (Signed)
Occupational Therapy Evaluation Patient Details Name: [name] MRN: [id number] DOB: 02-Jul-1928 Today's Date: 08/17/2016    History of Present Illness Pt is an 83 y/o female admitted secondary to a mechanical fall at home s/p IM femoral nail. PMH including but not limited to a-fib, CHF, DM, breast cancer, ESRD and HTN.   Clinical Impression   PTA Pt independent in ADL and mobility with RW. Pt currently max A for LB ADL and Max +2 for stand pivot transfers. Please see OT problem list below. Pt will benefit from skilled OT in the acute setting to maximize safety and independence in ADL and mobility and will require SNF level therapy to return to PLOF. Next session to focus on functional transfers for ADL and intro to AE for LB ADL.    Follow Up Recommendations  SNF;Supervision/Assistance - 24 hour    Equipment Recommendations  Other (comment) (defer to next venue of care)    Recommendations for Other Services       Precautions / Restrictions Precautions Precautions: Fall Required Braces or Orthoses: Knee Immobilizer - Right Restrictions Weight Bearing Restrictions: Yes RLE Weight Bearing: Touchdown weight bearing      Mobility Bed Mobility Overal bed mobility: Needs Assistance Bed Mobility: Supine to Sit     Supine to sit: Min assist     General bed mobility comments: increased time, min A with movement of R LE off of bed to achieve sitting EOB  Transfers Overall transfer level: Needs assistance Equipment used: 2 person hand held assist Transfers: Sit to/from [facility] Sit to Stand: Max assist;+2 physical assistance Stand pivot transfers: Max assist;+2 physical assistance       General transfer comment: increased time, good technique, max A x2 to rise into standing from bed and max A x2 with pivotal movements to chair. pt able to maintain TDWB R LE independently    Balance Overall balance assessment: Needs assistance Sitting-balance support:  Feet supported Sitting balance-Leahy Scale: Fair     Standing balance support: During functional activity;Bilateral upper extremity supported Standing balance-Leahy Scale: Poor Standing balance comment: max A x2                           ADL either performed or assessed with clinical judgement   ADL Overall ADL's : Needs assistance/impaired Eating/Feeding: Set up;Sitting   Grooming: Set up;Sitting   Upper Body Bathing: Minimal assistance;Sitting   Lower Body Bathing: Maximal assistance   Upper Body Dressing : Set up;Sitting   Lower Body Dressing: Maximal assistance   Toilet Transfer: Maximal assistance;+2 for physical assistance;+2 for safety/equipment;Stand-pivot;BSC (2 HHA) Toilet Transfer Details (indicate cue type and reason): Pt able to maintain TDW during stand pivot transfer Toileting- Clothing Manipulation and Hygiene: Total assistance       Functional mobility during ADLs: Maximal assistance;+2 for physical assistance;+2 for safety/equipment (2 HHA)       Vision Patient Visual Report: No change from baseline Vision Assessment?: No apparent visual deficits     Perception     Praxis      Pertinent Vitals/Pain Pain Assessment: 0-10 Pain Score: 7  Pain Location: R LE Pain Descriptors / Indicators: Sore;Grimacing;Guarding;Moaning Pain Intervention(s): Monitored during session;Repositioned;Limited activity within patient's tolerance     Hand Dominance Right   Extremity/Trunk Assessment Upper Extremity Assessment Upper Extremity Assessment: Overall WFL for tasks assessed   Lower Extremity Assessment Lower Extremity Assessment: RLE deficits/detail RLE Deficits / Details: pt with KI in  place and TDWB status. Pt was able to maintain TDWB during transfers independently RLE: Unable to fully assess due to immobilization;Unable to fully assess due to pain   Cervical / Trunk Assessment Cervical / Trunk Assessment: Other exceptions Cervical / Trunk  Exceptions: rounded shoulders, forward head   Communication Communication Communication: No difficulties   Cognition Arousal/Alertness: Awake/alert Behavior During Therapy: WFL for tasks assessed/performed Overall Cognitive Status: Within Functional Limits for tasks assessed                                     General Comments  Pt is extremely pleasant and willing to work with therapy    Exercises     Shoulder Instructions      Home Living Family/patient expects to be discharged to:: Skilled nursing facility                                        Prior Functioning/Environment Level of Independence: Independent                 OT Problem List: Decreased strength;Decreased range of motion;Decreased activity tolerance;Impaired balance (sitting and/or standing);Decreased cognition;Decreased safety awareness;Decreased knowledge of use of DME or AE;Decreased knowledge of precautions;Pain      OT Treatment/Interventions: Self-care/ADL training;Therapeutic exercise;Energy conservation;DME and/or AE instruction;Therapeutic activities;Patient/family education;Balance training    OT Goals(Current goals can be found in the care plan section) Acute Rehab OT Goals Patient Stated Goal: go to rehab prior to returning home OT Goal Formulation: With patient Time For Goal Achievement: 08/31/16 Potential to Achieve Goals: Good ADL Goals Pt Will Perform Lower Body Bathing: with supervision;with adaptive equipment;sitting/lateral leans Pt Will Perform Lower Body Dressing: with min assist;with adaptive equipment Pt Will Transfer to Toilet: with min assist;stand pivot transfer;bedside commode (with RW) Pt Will Perform Toileting - Clothing Manipulation and hygiene: with min guard assist;sit to/from stand  OT Frequency: Min 2X/week   Barriers to D/C: Decreased caregiver support          Co-evaluation PT/OT/SLP Co-Evaluation/Treatment: Yes Reason for  Co-Treatment: For patient/therapist safety;To address functional/ADL transfers PT goals addressed during session: Mobility/safety with mobility;Balance;Proper use of DME;Strengthening/ROM OT goals addressed during session: ADL's and self-care      AM-PAC PT "6 Clicks" Daily Activity     Outcome Measure Help from another person eating meals?: A Little Help from another person taking care of personal grooming?: A Little Help from another person toileting, which includes using toliet, bedpan, or urinal?: A Lot Help from another person bathing (including washing, rinsing, drying)?: A Lot Help from another person to put on and taking off regular upper body clothing?: A Little Help from another person to put on and taking off regular lower body clothing?: Total 6 Click Score: 14   End of Session Equipment Utilized During Treatment: Gait belt;Right knee immobilizer Nurse Communication: Mobility status;Patient requests pain meds  Activity Tolerance: Patient tolerated treatment well Patient left: in chair;with call bell/phone within reach;with chair alarm set  OT Visit Diagnosis: Unsteadiness on feet (R26.81);Other abnormalities of gait and mobility (R26.89);Pain Pain - Right/Left: Right Pain - part of body: Leg                Time: 1211-1231 OT Time Calculation (min): 20 min Charges:  OT General Charges $OT Visit: 1 Procedure OT Evaluation $OT Eval  Moderate Complexity: 1 Procedure G-Codes:     Hulda Humphrey OTR/L [place] 08/17/2016, 1:49 PM

## 2016-08-17 NOTE — Clinical Social Work Placement (Signed)
   CLINICAL SOCIAL WORK PLACEMENT  NOTE  Date:  08/17/2016  Patient Details  Name: [name] MRN: [id number] Date of Birth: 1929-01-15  Clinical Social Work is seeking post-discharge placement for this patient at the [place] level of care (*CSW will initial, date and re-position this form in  chart as items are completed):  Yes   Patient/family provided with [facility] Work Department's list of facilities offering this level of care within the geographic area requested by the patient (or if unable, by the patient's family).  Yes   Patient/family informed of their freedom to choose among providers that offer the needed level of care, that participate in Medicare, Medicaid or managed care program needed by the patient, have an available bed and are willing to accept the patient.  Yes   Patient/family informed of [facility]'s ownership interest in [facility], as well as of the fact that they are under no obligation to receive care at these facilities.  PASRR submitted to EDS on       PASRR number received on       Existing PASRR number confirmed on       FL2 transmitted to all facilities in geographic area requested by pt/family on       FL2 transmitted to all facilities within larger geographic area on 08/15/16     Patient informed that his/her managed care company has contracts with or will negotiate with certain facilities, including the following:        Yes   Patient/family informed of bed offers received.  Patient chooses bed at [facility]     Physician recommends and patient chooses bed at      Patient to be transferred to [facility] on 08/17/16.  Patient to be transferred to facility by [facility]     Patient family notified on 08/17/16 of transfer.  Name of family member notified:  Atalya Dano, [phone]     PHYSICIAN       Additional Comment:     _______________________________________________ Wende Neighbors, LCSW 08/17/2016, 2:58 PM

## 2016-08-17 NOTE — Progress Notes (Addendum)
Clinical Social Worker facilitated patient discharge including contacting patient family and facility to confirm patient discharge plans.  Clinical information faxed to facility and family agreeable with plan.  CSW arranged ambulance transport via [facility] to [facility] .  RN to call [phone] (pt will be placed in rm#[id number]) report prior to discharge.  Clinical Social Worker will sign off for now as social work intervention is no longer needed. Please consult [place] again if new need arises.  Rhea Pink, MSW, [place]

## 2016-08-17 NOTE — Evaluation (Signed)
Physical Therapy Evaluation Patient Details Name: [name] MRN: [id number] DOB: September 23, 1928 Today's Date: 08/17/2016   History of Present Illness  Pt is an 83 y/o female admitted secondary to a mechanical fall at home s/p IM femoral nail. PMH including but not limited to a-fib, CHF, DM, breast cancer, ESRD and HTN.  Clinical Impression  Pt presented supine in bed with HOB elevated, awake and willing to participate in therapy session. Pt currently requires min A for bed mobility and max A x2 for transfers. Pt was able to maintain TDWB R LE throughout independently. Pt would continue to benefit from skilled physical therapy services at this time while admitted and after d/c to address the below listed limitations in order to improve overall safety and independence with functional mobility.     Follow Up Recommendations SNF    Equipment Recommendations  None recommended by PT    Recommendations for Other Services       Precautions / Restrictions Precautions Precautions: Fall Required Braces or Orthoses: Knee Immobilizer - Right Restrictions Weight Bearing Restrictions: Yes RLE Weight Bearing: Touchdown weight bearing      Mobility  Bed Mobility Overal bed mobility: Needs Assistance Bed Mobility: Supine to Sit     Supine to sit: Min assist     General bed mobility comments: increased time, min A with movement of R LE off of bed to achieve sitting EOB  Transfers Overall transfer level: Needs assistance Equipment used: 2 person hand held assist Transfers: Sit to/from [facility] Sit to Stand: Max assist;+2 physical assistance Stand pivot transfers: Max assist;+2 physical assistance       General transfer comment: increased time, good technique, max A x2 to rise into standing from bed and max A x2 with pivotal movements to chair. pt able to maintain TDWB R LE independently  Ambulation/Gait                Stairs            Wheelchair  Mobility    Modified Rankin (Stroke Patients Only)       Balance Overall balance assessment: Needs assistance Sitting-balance support: Feet supported Sitting balance-Leahy Scale: Fair     Standing balance support: During functional activity;Bilateral upper extremity supported Standing balance-Leahy Scale: Poor Standing balance comment: max A x2                             Pertinent Vitals/Pain Pain Assessment: 0-10 Pain Score: 7  Pain Location: R LE Pain Descriptors / Indicators: Sore;Grimacing;Guarding;Moaning Pain Intervention(s): Monitored during session;Repositioned;Patient requesting pain meds-RN notified    Home Living Family/patient expects to be discharged to:: Skilled nursing facility                      Prior Function Level of Independence: Independent               Hand Dominance        Extremity/Trunk Assessment   Upper Extremity Assessment Upper Extremity Assessment: Defer to OT evaluation    Lower Extremity Assessment Lower Extremity Assessment: RLE deficits/detail RLE Deficits / Details: pt with KI in place and TDWB status. Pt was able to maintain TDWB during transfers independently RLE: Unable to fully assess due to immobilization;Unable to fully assess due to pain       Communication   Communication: No difficulties  Cognition Arousal/Alertness: Awake/alert Behavior During Therapy: [facility] for tasks assessed/performed  Overall Cognitive Status: Within Functional Limits for tasks assessed                                        General Comments      Exercises     Assessment/Plan    PT Assessment Patient needs continued PT services  PT Problem List Decreased strength;Decreased range of motion;Decreased activity tolerance;Decreased balance;Decreased mobility;Decreased coordination;Decreased safety awareness;Decreased knowledge of use of DME;Decreased knowledge of precautions;Pain       PT  Treatment Interventions DME instruction;Gait training;Stair training;Functional mobility training;Therapeutic activities;Therapeutic exercise;Neuromuscular re-education;Balance training;Patient/family education    PT Goals (Current goals can be found in the Care Plan section)  Acute Rehab PT Goals Patient Stated Goal: go to rehab prior to returning home PT Goal Formulation: With patient Time For Goal Achievement: 08/31/16 Potential to Achieve Goals: Good    Frequency Min 3X/week   Barriers to discharge        Co-evaluation PT/OT/SLP Co-Evaluation/Treatment: Yes Reason for Co-Treatment: For patient/therapist safety;To address functional/ADL transfers PT goals addressed during session: Mobility/safety with mobility;Balance;Proper use of DME;Strengthening/ROM         AM-PAC PT "6 Clicks" Daily Activity  Outcome Measure Difficulty turning over in bed (including adjusting bedclothes, sheets and blankets)?: Total Difficulty moving from lying on back to sitting on the side of the bed? : Total Difficulty sitting down on and standing up from a chair with arms (e.g., wheelchair, bedside commode, etc,.)?: Total Help needed moving to and from a bed to chair (including a wheelchair)?: A Lot Help needed walking in hospital room?: Total Help needed climbing 3-5 steps with a railing? : Total 6 Click Score: 7    End of Session Equipment Utilized During Treatment: Gait belt Activity Tolerance: Patient tolerated treatment well Patient left: in chair;with call bell/phone within reach;with chair alarm set Nurse Communication: Mobility status;Patient requests pain meds PT Visit Diagnosis: Other abnormalities of gait and mobility (R26.89);Pain Pain - Right/Left: Right Pain - part of body: Leg    Time: 1211-1231 PT Time Calculation (min) (ACUTE ONLY): 20 min   Charges:   PT Evaluation $PT Eval Moderate Complexity: 1 Procedure     PT G Codes:        Sherie Don, PT,  DPT [place] 08/17/2016, 1:06 PM

## 2016-08-17 NOTE — Progress Notes (Signed)
SPORTS MEDICINE AND JOINT REPLACEMENT  Lara Mulch, MD    Carlyon Shadow, PA-C [address], [place], [place]  [address]                             [phone]   PROGRESS NOTE  Subjective:  negative for Chest Pain  negative for Shortness of Breath  negative for Nausea/Vomiting   negative for Calf Pain  negative for Bowel Movement   Tolerating Diet: yes         Patient reports pain as 3 on 0-10 scale.    Objective: Vital signs in last 24 hours:   Patient Vitals for the past 24 hrs:  BP Temp Temp src Pulse Resp SpO2 Weight  08/17/16 0525 (!) 151/49 99.2 F (37.3 C) Oral 68 18 100 % -  08/17/16 0043 (!) 135/42 99.2 F (37.3 C) Oral 71 18 100 % -  08/16/16 2102 (!) 135/35 98.5 F (36.9 C) Oral 69 18 100 % -  08/16/16 1816 (!) 98/18 98.6 F (37 C) Oral 66 18 98 % -  08/16/16 1351 98/62 (!) 100.8 F (38.2 C) Oral 73 18 100 % -  08/16/16 1127 (!) 121/43 98.9 F (37.2 C) Oral 73 18 100 % -  08/16/16 1045 (!) 139/58 98.1 F (36.7 C) Oral 72 18 95 % 58.3 kg (128 lb 8.5 oz)  08/16/16 1030 130/62 - - 66 18 - -  08/16/16 1000 126/60 - - 67 17 - -  08/16/16 0930 (!) 100/44 - - 64 18 - -  08/16/16 0900 123/65 - - 62 18 - -  08/16/16 0830 (!) 115/30 - - 62 17 - -    @flow {1959:LAST@   Intake/Output from previous day:   No intake/output data recorded.   Intake/Output this shift:   No intake/output data recorded.   Intake/Output      06/01 0701 - 06/02 0700 06/02 0701 - 06/03 0700   I.V. (mL/kg)     Blood     IV Piggyback     Total Intake(mL/kg)     Other 0    Blood     Total Output 0     Net 0             LABORATORY DATA:  Recent Labs  08/13/16 1639 08/14/16 1006 08/15/16 0724 08/16/16 0739  WBC 9.9 9.2 9.5 7.0  HGB 8.9* 8.2* 7.8* 8.3*  HCT 28.5* 26.0* 23.9* 25.1*  PLT 381 334 292 238    Recent Labs  08/13/16 1639 08/14/16 1300 08/16/16 0740  NA 141 140 135  K 4.3 4.7 5.0  CL 99* 102 100*  CO2 29 25 23   BUN 23* 29* 25*  CREATININE 5.50*  6.87* 5.63*  GLUCOSE 222* 222* 119*  CALCIUM 8.6* 7.8* 8.0*   Lab Results  Component Value Date   INR 0.99 07/31/2016   INR 4.3 08/13/2012   INR 1.7 07/30/2012    Examination:  General appearance: alert, cooperative and no distress Extremities: extremities normal, atraumatic, no cyanosis or edema  Wound Exam: clean, dry, intact   Drainage:  None: wound tissue dry  Motor Exam: Quadriceps and Hamstrings Intact  Sensory Exam: Superficial Peroneal, Deep Peroneal and Tibial normal   Assessment:    2 Days Post-Op  Procedure(s) (LRB): INTRAMEDULLARY (IM) RETROGRADE FEMORAL NAILING (Right)  ADDITIONAL DIAGNOSIS:  Principal Problem:   Leg fracture Active Problems:   HTN (hypertension)   DM2 (diabetes  mellitus, type 2) (H[facility])   Anemia   Chronic combined systolic and diastolic heart failure EF 45-50% by echo 06/25/2012   ESRD on dialysis ([place])   PAF (paroxysmal atrial fibrillation) ([place])     Plan: Physical Therapy as ordered Touch Down Weight Bearing (TDWB)  DVT Prophylaxis:  Aspirin  DISCHARGE PLAN: Skilled Nursing Facility/Rehab  Patient doing very well from an orthopedic standpoint. Will continue to follow through the weekend for ortho needs. Medicine following. Likely D/C to SNF         Donia Ast 08/17/2016, 8:08 AM

## 2016-08-17 NOTE — Progress Notes (Signed)
Patient left unit with transport service.

## 2016-08-19 DIAGNOSIS — D631 Anemia in chronic kidney disease: Secondary | ICD-10-CM | POA: Diagnosis not present

## 2016-08-19 DIAGNOSIS — N2581 Secondary hyperparathyroidism of renal origin: Secondary | ICD-10-CM | POA: Diagnosis not present

## 2016-08-19 DIAGNOSIS — E1122 Type 2 diabetes mellitus with diabetic chronic kidney disease: Secondary | ICD-10-CM | POA: Diagnosis not present

## 2016-08-19 DIAGNOSIS — D509 Iron deficiency anemia, unspecified: Secondary | ICD-10-CM | POA: Diagnosis not present

## 2016-08-19 DIAGNOSIS — N186 End stage renal disease: Secondary | ICD-10-CM | POA: Diagnosis not present

## 2016-08-19 LAB — BPAM RBC
BLOOD PRODUCT EXPIRATION DATE: 201806132359
BLOOD PRODUCT EXPIRATION DATE: 201806132359
Blood Product Expiration Date: 201806082359
ISSUE DATE / TIME: 201805310931
Unit Type and Rh: 6200
Unit Type and Rh: 6200
Unit Type and Rh: 6200

## 2016-08-19 LAB — TYPE AND SCREEN
ABO/RH(D): A POS
Antibody Screen: NEGATIVE
UNIT DIVISION: 0
UNIT DIVISION: 0
Unit division: 0

## 2016-08-21 DIAGNOSIS — D631 Anemia in chronic kidney disease: Secondary | ICD-10-CM | POA: Diagnosis not present

## 2016-08-21 DIAGNOSIS — D509 Iron deficiency anemia, unspecified: Secondary | ICD-10-CM | POA: Diagnosis not present

## 2016-08-21 DIAGNOSIS — E1122 Type 2 diabetes mellitus with diabetic chronic kidney disease: Secondary | ICD-10-CM | POA: Diagnosis not present

## 2016-08-21 DIAGNOSIS — N186 End stage renal disease: Secondary | ICD-10-CM | POA: Diagnosis not present

## 2016-08-21 DIAGNOSIS — N2581 Secondary hyperparathyroidism of renal origin: Secondary | ICD-10-CM | POA: Diagnosis not present

## 2016-08-22 DIAGNOSIS — I5042 Chronic combined systolic (congestive) and diastolic (congestive) heart failure: Secondary | ICD-10-CM | POA: Diagnosis not present

## 2016-08-22 DIAGNOSIS — S72324D Nondisplaced transverse fracture of shaft of right femur, subsequent encounter for closed fracture with routine healing: Secondary | ICD-10-CM | POA: Diagnosis not present

## 2016-08-22 DIAGNOSIS — R609 Edema, unspecified: Secondary | ICD-10-CM | POA: Diagnosis not present

## 2016-08-22 DIAGNOSIS — L97819 Non-pressure chronic ulcer of other part of right lower leg with unspecified severity: Secondary | ICD-10-CM | POA: Diagnosis not present

## 2016-08-22 DIAGNOSIS — M79604 Pain in right leg: Secondary | ICD-10-CM | POA: Diagnosis not present

## 2016-08-23 DIAGNOSIS — N2581 Secondary hyperparathyroidism of renal origin: Secondary | ICD-10-CM | POA: Diagnosis not present

## 2016-08-23 DIAGNOSIS — D509 Iron deficiency anemia, unspecified: Secondary | ICD-10-CM | POA: Diagnosis not present

## 2016-08-23 DIAGNOSIS — E1122 Type 2 diabetes mellitus with diabetic chronic kidney disease: Secondary | ICD-10-CM | POA: Diagnosis not present

## 2016-08-23 DIAGNOSIS — N186 End stage renal disease: Secondary | ICD-10-CM | POA: Diagnosis not present

## 2016-08-23 DIAGNOSIS — D631 Anemia in chronic kidney disease: Secondary | ICD-10-CM | POA: Diagnosis not present

## 2016-08-26 DIAGNOSIS — D631 Anemia in chronic kidney disease: Secondary | ICD-10-CM | POA: Diagnosis not present

## 2016-08-26 DIAGNOSIS — D509 Iron deficiency anemia, unspecified: Secondary | ICD-10-CM | POA: Diagnosis not present

## 2016-08-26 DIAGNOSIS — N186 End stage renal disease: Secondary | ICD-10-CM | POA: Diagnosis not present

## 2016-08-26 DIAGNOSIS — N2581 Secondary hyperparathyroidism of renal origin: Secondary | ICD-10-CM | POA: Diagnosis not present

## 2016-08-26 DIAGNOSIS — E1122 Type 2 diabetes mellitus with diabetic chronic kidney disease: Secondary | ICD-10-CM | POA: Diagnosis not present

## 2016-08-28 DIAGNOSIS — N186 End stage renal disease: Secondary | ICD-10-CM | POA: Diagnosis not present

## 2016-08-28 DIAGNOSIS — E1122 Type 2 diabetes mellitus with diabetic chronic kidney disease: Secondary | ICD-10-CM | POA: Diagnosis not present

## 2016-08-28 DIAGNOSIS — D631 Anemia in chronic kidney disease: Secondary | ICD-10-CM | POA: Diagnosis not present

## 2016-08-28 DIAGNOSIS — D509 Iron deficiency anemia, unspecified: Secondary | ICD-10-CM | POA: Diagnosis not present

## 2016-08-28 DIAGNOSIS — N2581 Secondary hyperparathyroidism of renal origin: Secondary | ICD-10-CM | POA: Diagnosis not present

## 2016-08-29 DIAGNOSIS — L97129 Non-pressure chronic ulcer of left thigh with unspecified severity: Secondary | ICD-10-CM | POA: Diagnosis not present

## 2016-08-30 DIAGNOSIS — N186 End stage renal disease: Secondary | ICD-10-CM | POA: Diagnosis not present

## 2016-08-30 DIAGNOSIS — S72451D Displaced supracondylar fracture without intracondylar extension of lower end of right femur, subsequent encounter for closed fracture with routine healing: Secondary | ICD-10-CM | POA: Diagnosis not present

## 2016-08-30 DIAGNOSIS — D631 Anemia in chronic kidney disease: Secondary | ICD-10-CM | POA: Diagnosis not present

## 2016-08-30 DIAGNOSIS — D509 Iron deficiency anemia, unspecified: Secondary | ICD-10-CM | POA: Diagnosis not present

## 2016-08-30 DIAGNOSIS — N2581 Secondary hyperparathyroidism of renal origin: Secondary | ICD-10-CM | POA: Diagnosis not present

## 2016-08-30 DIAGNOSIS — E1122 Type 2 diabetes mellitus with diabetic chronic kidney disease: Secondary | ICD-10-CM | POA: Diagnosis not present

## 2016-09-02 DIAGNOSIS — D509 Iron deficiency anemia, unspecified: Secondary | ICD-10-CM | POA: Diagnosis not present

## 2016-09-02 DIAGNOSIS — N186 End stage renal disease: Secondary | ICD-10-CM | POA: Diagnosis not present

## 2016-09-02 DIAGNOSIS — D631 Anemia in chronic kidney disease: Secondary | ICD-10-CM | POA: Diagnosis not present

## 2016-09-02 DIAGNOSIS — E1122 Type 2 diabetes mellitus with diabetic chronic kidney disease: Secondary | ICD-10-CM | POA: Diagnosis not present

## 2016-09-02 DIAGNOSIS — N2581 Secondary hyperparathyroidism of renal origin: Secondary | ICD-10-CM | POA: Diagnosis not present

## 2016-09-04 DIAGNOSIS — N2581 Secondary hyperparathyroidism of renal origin: Secondary | ICD-10-CM | POA: Diagnosis not present

## 2016-09-04 DIAGNOSIS — D509 Iron deficiency anemia, unspecified: Secondary | ICD-10-CM | POA: Diagnosis not present

## 2016-09-04 DIAGNOSIS — D631 Anemia in chronic kidney disease: Secondary | ICD-10-CM | POA: Diagnosis not present

## 2016-09-04 DIAGNOSIS — E1122 Type 2 diabetes mellitus with diabetic chronic kidney disease: Secondary | ICD-10-CM | POA: Diagnosis not present

## 2016-09-04 DIAGNOSIS — N186 End stage renal disease: Secondary | ICD-10-CM | POA: Diagnosis not present

## 2016-09-05 DIAGNOSIS — N186 End stage renal disease: Secondary | ICD-10-CM | POA: Diagnosis not present

## 2016-09-05 DIAGNOSIS — I480 Paroxysmal atrial fibrillation: Secondary | ICD-10-CM | POA: Diagnosis not present

## 2016-09-05 DIAGNOSIS — S72324D Nondisplaced transverse fracture of shaft of right femur, subsequent encounter for closed fracture with routine healing: Secondary | ICD-10-CM | POA: Diagnosis not present

## 2016-09-05 DIAGNOSIS — I5042 Chronic combined systolic (congestive) and diastolic (congestive) heart failure: Secondary | ICD-10-CM | POA: Diagnosis not present

## 2016-09-05 DIAGNOSIS — L97819 Non-pressure chronic ulcer of other part of right lower leg with unspecified severity: Secondary | ICD-10-CM | POA: Diagnosis not present

## 2016-09-06 DIAGNOSIS — D509 Iron deficiency anemia, unspecified: Secondary | ICD-10-CM | POA: Diagnosis not present

## 2016-09-06 DIAGNOSIS — N2581 Secondary hyperparathyroidism of renal origin: Secondary | ICD-10-CM | POA: Diagnosis not present

## 2016-09-06 DIAGNOSIS — N186 End stage renal disease: Secondary | ICD-10-CM | POA: Diagnosis not present

## 2016-09-06 DIAGNOSIS — D631 Anemia in chronic kidney disease: Secondary | ICD-10-CM | POA: Diagnosis not present

## 2016-09-06 DIAGNOSIS — E1122 Type 2 diabetes mellitus with diabetic chronic kidney disease: Secondary | ICD-10-CM | POA: Diagnosis not present

## 2016-09-08 DIAGNOSIS — I132 Hypertensive heart and chronic kidney disease with heart failure and with stage 5 chronic kidney disease, or end stage renal disease: Secondary | ICD-10-CM | POA: Diagnosis not present

## 2016-09-08 DIAGNOSIS — I5042 Chronic combined systolic (congestive) and diastolic (congestive) heart failure: Secondary | ICD-10-CM | POA: Diagnosis not present

## 2016-09-08 DIAGNOSIS — S72324D Nondisplaced transverse fracture of shaft of right femur, subsequent encounter for closed fracture with routine healing: Secondary | ICD-10-CM | POA: Diagnosis not present

## 2016-09-08 DIAGNOSIS — N186 End stage renal disease: Secondary | ICD-10-CM | POA: Diagnosis not present

## 2016-09-08 DIAGNOSIS — S82191D Other fracture of upper end of right tibia, subsequent encounter for closed fracture with routine healing: Secondary | ICD-10-CM | POA: Diagnosis not present

## 2016-09-08 DIAGNOSIS — E1122 Type 2 diabetes mellitus with diabetic chronic kidney disease: Secondary | ICD-10-CM | POA: Diagnosis not present

## 2016-09-09 DIAGNOSIS — N186 End stage renal disease: Secondary | ICD-10-CM | POA: Diagnosis not present

## 2016-09-09 DIAGNOSIS — D631 Anemia in chronic kidney disease: Secondary | ICD-10-CM | POA: Diagnosis not present

## 2016-09-09 DIAGNOSIS — N2581 Secondary hyperparathyroidism of renal origin: Secondary | ICD-10-CM | POA: Diagnosis not present

## 2016-09-09 DIAGNOSIS — E1122 Type 2 diabetes mellitus with diabetic chronic kidney disease: Secondary | ICD-10-CM | POA: Diagnosis not present

## 2016-09-09 DIAGNOSIS — D509 Iron deficiency anemia, unspecified: Secondary | ICD-10-CM | POA: Diagnosis not present

## 2016-09-10 ENCOUNTER — Telehealth: Payer: Self-pay | Admitting: Internal Medicine

## 2016-09-10 DIAGNOSIS — Z992 Dependence on renal dialysis: Secondary | ICD-10-CM | POA: Diagnosis not present

## 2016-09-10 DIAGNOSIS — I4891 Unspecified atrial fibrillation: Secondary | ICD-10-CM | POA: Diagnosis not present

## 2016-09-10 DIAGNOSIS — T86821 Skin graft (allograft) (autograft) failure: Secondary | ICD-10-CM | POA: Diagnosis not present

## 2016-09-10 DIAGNOSIS — S72401S Unspecified fracture of lower end of right femur, sequela: Secondary | ICD-10-CM | POA: Diagnosis not present

## 2016-09-10 DIAGNOSIS — E1122 Type 2 diabetes mellitus with diabetic chronic kidney disease: Secondary | ICD-10-CM | POA: Diagnosis not present

## 2016-09-10 DIAGNOSIS — N186 End stage renal disease: Secondary | ICD-10-CM | POA: Diagnosis not present

## 2016-09-10 DIAGNOSIS — S82871S Displaced pilon fracture of right tibia, sequela: Secondary | ICD-10-CM | POA: Diagnosis not present

## 2016-09-10 DIAGNOSIS — S81801A Unspecified open wound, right lower leg, initial encounter: Secondary | ICD-10-CM | POA: Diagnosis not present

## 2016-09-10 DIAGNOSIS — S81001A Unspecified open wound, right knee, initial encounter: Secondary | ICD-10-CM | POA: Diagnosis not present

## 2016-09-10 DIAGNOSIS — Z794 Long term (current) use of insulin: Secondary | ICD-10-CM | POA: Diagnosis not present

## 2016-09-10 NOTE — Telephone Encounter (Signed)
Spoke with Amanda to give verbal orders per MD 

## 2016-09-10 NOTE — Telephone Encounter (Signed)
Requesting orders for nursing for two one and one week three for diabetic management.

## 2016-09-11 DIAGNOSIS — N186 End stage renal disease: Secondary | ICD-10-CM | POA: Diagnosis not present

## 2016-09-11 DIAGNOSIS — E1122 Type 2 diabetes mellitus with diabetic chronic kidney disease: Secondary | ICD-10-CM | POA: Diagnosis not present

## 2016-09-11 DIAGNOSIS — D631 Anemia in chronic kidney disease: Secondary | ICD-10-CM | POA: Diagnosis not present

## 2016-09-11 DIAGNOSIS — N2581 Secondary hyperparathyroidism of renal origin: Secondary | ICD-10-CM | POA: Diagnosis not present

## 2016-09-11 DIAGNOSIS — D509 Iron deficiency anemia, unspecified: Secondary | ICD-10-CM | POA: Diagnosis not present

## 2016-09-13 DIAGNOSIS — N186 End stage renal disease: Secondary | ICD-10-CM | POA: Diagnosis not present

## 2016-09-13 DIAGNOSIS — N2581 Secondary hyperparathyroidism of renal origin: Secondary | ICD-10-CM | POA: Diagnosis not present

## 2016-09-13 DIAGNOSIS — D509 Iron deficiency anemia, unspecified: Secondary | ICD-10-CM | POA: Diagnosis not present

## 2016-09-13 DIAGNOSIS — D631 Anemia in chronic kidney disease: Secondary | ICD-10-CM | POA: Diagnosis not present

## 2016-09-13 DIAGNOSIS — E1122 Type 2 diabetes mellitus with diabetic chronic kidney disease: Secondary | ICD-10-CM | POA: Diagnosis not present

## 2016-09-14 DIAGNOSIS — N186 End stage renal disease: Secondary | ICD-10-CM | POA: Diagnosis not present

## 2016-09-14 DIAGNOSIS — I120 Hypertensive chronic kidney disease with stage 5 chronic kidney disease or end stage renal disease: Secondary | ICD-10-CM | POA: Diagnosis not present

## 2016-09-14 DIAGNOSIS — E1122 Type 2 diabetes mellitus with diabetic chronic kidney disease: Secondary | ICD-10-CM | POA: Diagnosis not present

## 2016-09-16 DIAGNOSIS — N186 End stage renal disease: Secondary | ICD-10-CM | POA: Diagnosis not present

## 2016-09-16 DIAGNOSIS — N2581 Secondary hyperparathyroidism of renal origin: Secondary | ICD-10-CM | POA: Diagnosis not present

## 2016-09-16 DIAGNOSIS — D631 Anemia in chronic kidney disease: Secondary | ICD-10-CM | POA: Diagnosis not present

## 2016-09-16 DIAGNOSIS — E1122 Type 2 diabetes mellitus with diabetic chronic kidney disease: Secondary | ICD-10-CM | POA: Diagnosis not present

## 2016-09-17 DIAGNOSIS — I871 Compression of vein: Secondary | ICD-10-CM | POA: Diagnosis not present

## 2016-09-17 DIAGNOSIS — N186 End stage renal disease: Secondary | ICD-10-CM | POA: Diagnosis not present

## 2016-09-17 DIAGNOSIS — T82858A Stenosis of vascular prosthetic devices, implants and grafts, initial encounter: Secondary | ICD-10-CM | POA: Diagnosis not present

## 2016-09-17 DIAGNOSIS — Z992 Dependence on renal dialysis: Secondary | ICD-10-CM | POA: Diagnosis not present

## 2016-09-18 DIAGNOSIS — N186 End stage renal disease: Secondary | ICD-10-CM | POA: Diagnosis not present

## 2016-09-18 DIAGNOSIS — D631 Anemia in chronic kidney disease: Secondary | ICD-10-CM | POA: Diagnosis not present

## 2016-09-18 DIAGNOSIS — E1122 Type 2 diabetes mellitus with diabetic chronic kidney disease: Secondary | ICD-10-CM | POA: Diagnosis not present

## 2016-09-18 DIAGNOSIS — N2581 Secondary hyperparathyroidism of renal origin: Secondary | ICD-10-CM | POA: Diagnosis not present

## 2016-09-20 ENCOUNTER — Ambulatory Visit: Payer: Medicare Other | Admitting: Internal Medicine

## 2016-09-20 DIAGNOSIS — E1122 Type 2 diabetes mellitus with diabetic chronic kidney disease: Secondary | ICD-10-CM | POA: Diagnosis not present

## 2016-09-20 DIAGNOSIS — N186 End stage renal disease: Secondary | ICD-10-CM | POA: Diagnosis not present

## 2016-09-20 DIAGNOSIS — D631 Anemia in chronic kidney disease: Secondary | ICD-10-CM | POA: Diagnosis not present

## 2016-09-20 DIAGNOSIS — N2581 Secondary hyperparathyroidism of renal origin: Secondary | ICD-10-CM | POA: Diagnosis not present

## 2016-09-23 DIAGNOSIS — N2581 Secondary hyperparathyroidism of renal origin: Secondary | ICD-10-CM | POA: Diagnosis not present

## 2016-09-23 DIAGNOSIS — E1122 Type 2 diabetes mellitus with diabetic chronic kidney disease: Secondary | ICD-10-CM | POA: Diagnosis not present

## 2016-09-23 DIAGNOSIS — N186 End stage renal disease: Secondary | ICD-10-CM | POA: Diagnosis not present

## 2016-09-23 DIAGNOSIS — D631 Anemia in chronic kidney disease: Secondary | ICD-10-CM | POA: Diagnosis not present

## 2016-09-24 ENCOUNTER — Encounter: Payer: Self-pay | Admitting: Internal Medicine

## 2016-09-24 ENCOUNTER — Ambulatory Visit (INDEPENDENT_AMBULATORY_CARE_PROVIDER_SITE_OTHER): Payer: Medicare Other | Admitting: Internal Medicine

## 2016-09-24 VITALS — BP 154/82 | HR 62 | Temp 97.8°F | Resp 16

## 2016-09-24 DIAGNOSIS — I10 Essential (primary) hypertension: Secondary | ICD-10-CM | POA: Diagnosis not present

## 2016-09-24 DIAGNOSIS — N186 End stage renal disease: Secondary | ICD-10-CM | POA: Diagnosis not present

## 2016-09-24 DIAGNOSIS — N184 Chronic kidney disease, stage 4 (severe): Secondary | ICD-10-CM | POA: Diagnosis not present

## 2016-09-24 DIAGNOSIS — Z992 Dependence on renal dialysis: Secondary | ICD-10-CM | POA: Diagnosis not present

## 2016-09-24 DIAGNOSIS — E1122 Type 2 diabetes mellitus with diabetic chronic kidney disease: Secondary | ICD-10-CM | POA: Diagnosis not present

## 2016-09-24 DIAGNOSIS — E038 Other specified hypothyroidism: Secondary | ICD-10-CM

## 2016-09-24 NOTE — Patient Instructions (Addendum)
  A prescription for blood work was given to be done at hemodialysis.      Medications reviewed and updated.  No changes recommended at this time.    Please followup in one year

## 2016-09-24 NOTE — Progress Notes (Signed)
Subjective:    Patient ID: [name], female    DOB: [birth year], 83 y.o.   MRN: [id number]  HPI The patient is here for follow up.  She fell 5/29. She broke her right distal femur.   She did have surgery. She went to rehab and is now back living with her daughter.  She is following with ortho.   She is wheelchair bound for now.    Hypertension: She is taking her medication daily. She is compliant with a low sodium diet.  She denies chest pain, palpitations, shortness of breath and regular headaches. She is not exercising regularly.     ESRD on HD: she does to HD MWF.    Diabetes: She is taking her medication daily as prescribed. She is compliant with a diabetic diet. She is not exercising regularly. She monitors her sugars and they have been running 109, 112.    Hypothyroidism:  She is taking her medication daily.  She denies any recent changes in energy or weight that are unexplained.     Medications and allergies reviewed with patient and updated if appropriate.  Patient Active Problem List   Diagnosis Date Noted  . Leg fracture 08/13/2016  . Hypothyroidism 08/30/2015  . Labile blood glucose   . Memory loss   . Hip fracture ([place]) 07/12/2015  . Swelling   . ESRD on dialysis ([place])   . PAF (paroxysmal atrial fibrillation) ([place])   . Chronic combined systolic and diastolic heart failure EF 45-50% by echo 06/25/2012 07/30/2012  . Breast cancer, s/p Lt mastectomy 07/22/2012  . Pleural effusion due to CHF (congestive heart failure) ([place]) 07/20/2012  . Hypotension, unspecified 06/25/2012  . Right tibial fracture 06/21/2012  . Metabolic bone disease 20/94/7096  . Secondary hyperparathyroidism of renal origin ([place]) 06/19/2012  . Nonunion of fracture, R tibia 06/17/2012  . H/O radioactive iodine thyroid ablation   . Closed fracture of right fibula and tibia 07/10/2011  . HTN (hypertension) 07/10/2011  . DM2 (diabetes mellitus, type 2) ([place]) 07/10/2011  . Hyperlipidemia  07/10/2011  . Anemia 07/10/2011    Current Outpatient Prescriptions on File Prior to Visit  Medication Sig Dispense Refill  . acetaminophen (TYLENOL) 325 MG tablet Take 2 tablets (650 mg total) by mouth every 6 (six) hours as needed for mild pain or moderate pain.    . Amino Acids-Protein Hydrolys (FEEDING SUPPLEMENT, PRO-STAT SUGAR FREE 64,) LIQD Take 30 mLs by mouth 2 (two) times daily.    Marland Kitchen aspirin EC 325 MG tablet Take 1 tablet (325 mg total) by mouth daily. For 30 days post op for DVT Prophylaxis.  Resume 81 mg Aspirin after this has been completed. 30 tablet 0  . b complex-vitamin c-folic acid (NEPHRO-VITE) 0.8 MG TABS tablet Take 1 tablet by mouth at bedtime. 30 tablet 5  . Cholecalciferol (VITAMIN D-3 PO) Take 1 capsule by mouth daily.    . darbepoetin (ARANESP) 100 MCG/0.5ML SOLN Inject 0.5 mLs (100 mcg total) into the vein every Wednesday with hemodialysis. 4.2 mL 6  . diclofenac sodium (VOLTAREN) 1 % GEL Apply 2 g topically 3 (three) times daily. 1 Tube 1  . glimepiride (AMARYL) 1 MG tablet TAKE 3 TABLETS(3 MG) BY MOUTH DAILY WITH BREAKFAST (Patient taking differently: Take 1 mg by mouth once a day) 90 tablet 2  . hydrALAZINE (APRESOLINE) 50 MG tablet TAKE 1 TABLET(50 MG) BY MOUTH TWICE DAILY (Patient taking differently: Take 50 mg by mouth once a day) 180 tablet 1  .  isosorbide mononitrate (IMDUR) 30 MG 24 hr tablet Take 1 tablet (30 mg total) by mouth daily. 30 tablet 1  . levothyroxine (SYNTHROID, LEVOTHROID) 75 MCG tablet Take 1 tablet (75 mcg total) by mouth daily before breakfast. --- Office visit needed for further refills (Patient taking differently: Take 75 mcg by mouth daily before breakfast. ) 90 tablet 0  . methocarbamol (ROBAXIN) 500 MG tablet Take 1 tablet (500 mg total) by mouth every 6 (six) hours as needed for muscle spasms.    . nebivolol (BYSTOLIC) 10 MG tablet Take 1 tablet (10 mg total) by mouth daily. 30 tablet 5  . Nutritional Supplements (FEEDING SUPPLEMENT,  NEPRO CARB STEADY,) LIQD Take 237 mLs by mouth 3 (three) times daily as needed (Supplement).  0  . omeprazole (PRILOSEC) 20 MG capsule Take 1 capsule (20 mg total) by mouth daily. While taking anti inflammatory medicine daily 30 capsule 0  . pravastatin (PRAVACHOL) 20 MG tablet Take 1 tablet (20 mg total) by mouth daily at 6 PM. 30 tablet 5  . sevelamer carbonate (RENVELA) 800 MG tablet Take 1 tablet (800 mg total) by mouth 3 (three) times daily with meals. 90 tablet 5  . vitamin E 200 UNIT capsule Take 200 Units by mouth daily.     No current facility-administered medications on file prior to visit.     Past Medical History:  Diagnosis Date  . Arthritis   . Atrial fibrillation ([place]) 06/25/2012   2D Echo - EF 45-50, left atrium severely dilated, mitral valve heavily calcified annulus with calcification of the subvalvular apparatus - no longer on Coumadin  . Breast cancer ([place])   . CHF (congestive heart failure) ([place])   . DM2 (diabetes mellitus, type 2) ([place])   . ESRD (end stage renal disease) ([place])    HD MWF - [facility]  . HTN (hypertension)   . Hyperlipidemia   . Hyperparathyroidism, secondary renal ([place])   . Hypothyroid   . Shortness of breath   . Tibia/fibula fracture 07/10/2011   right - nonunion repair 06/2012    Past Surgical History:  Procedure Laterality Date  . ABDOMINAL SURGERY    . ANKLE FUSION Right   . ARTERIOVENOUS GRAFT PLACEMENT Right   . BREAST SURGERY Left    Mastectomy  . EYE SURGERY Bilateral    Cataract  . FEMUR IM NAIL Right 08/15/2016   Procedure: INTRAMEDULLARY (IM) RETROGRADE FEMORAL NAILING;  Surgeon: Renette Butters, MD;  Location: [facility];  Service: Orthopedics;  Laterality: Right;  . MASTECTOMY    . ORIF TIBIA FRACTURE Right 06/16/2012   Procedure: OPEN REDUCTION INTERNAL FIXATION (ORIF) TIBIA FRACTURE;  Surgeon: Rozanna Box, MD;  Location: [facility];  Service: Orthopedics;  Laterality: Right;  . THYROID SURGERY      Social History   Social  History  . Marital status: Single    Spouse name: N/A  . Number of children: N/A  . Years of education: N/A   Social History Main Topics  . Smoking status: Never Smoker  . Smokeless tobacco: Current User    Types: Snuff  . Alcohol use No  . Drug use: No  . Sexual activity: No   Other Topics Concern  . Not on file   Social History Narrative  . No narrative on file    Family History  Problem Relation Age of Onset  . Hyperlipidemia Father   . Diabetes Father   . Breast cancer Sister     Review of Systems  Constitutional: Negative for chills, fatigue and fever.  Respiratory: Positive for cough (allergies). Negative for shortness of breath and wheezing.   Cardiovascular: Positive for leg swelling (right leg since fracture). Negative for chest pain and palpitations.  Gastrointestinal: Negative for abdominal pain and nausea.       No gerd  Neurological: Positive for light-headedness (occasional). Negative for dizziness and headaches.       Objective:   Vitals:   09/24/16 1626  BP: (!) 154/82  Pulse: 62  Resp: 16  Temp: 97.8 F (36.6 C)   Wt Readings from Last 3 Encounters:  08/16/16 128 lb 8.5 oz (58.3 kg)  07/31/16 136 lb (61.7 kg)  08/29/15 133 lb (60.3 kg)   There is no height or weight on file to calculate BMI.   Physical Exam    Constitutional: Appears well-developed and well-nourished. No distress.  HENT:  Head: Normocephalic and atraumatic.  Neck: Neck supple. No tracheal deviation present. No thyromegaly present.  No cervical lymphadenopathy Cardiovascular: Normal rate, regular rhythm and normal heart sounds.   No murmur heard. No carotid bruit .  Mild RLE edema, trace LLE edema Pulmonary/Chest: Effort normal and breath sounds normal. No respiratory distress. No has no wheezes. No rales.  Abd: soft, NT, ND Skin: Skin is warm and dry. Not diaphoretic.  Psychiatric: Normal mood and affect. Behavior is normal.      Assessment & Plan:    See  Problem List for Assessment and Plan of chronic medical problems.

## 2016-09-24 NOTE — Assessment & Plan Note (Signed)
Doing HD MWF

## 2016-09-24 NOTE — Assessment & Plan Note (Signed)
Check tsh  Titrate med dose if needed  

## 2016-09-24 NOTE — Assessment & Plan Note (Signed)
Sugars are well controlled at home Continue current medication regimen

## 2016-09-24 NOTE — Assessment & Plan Note (Signed)
Overall controlled - has highs and lows depending on dialysis days Continue current medications

## 2016-09-25 DIAGNOSIS — D631 Anemia in chronic kidney disease: Secondary | ICD-10-CM | POA: Diagnosis not present

## 2016-09-25 DIAGNOSIS — N186 End stage renal disease: Secondary | ICD-10-CM | POA: Diagnosis not present

## 2016-09-25 DIAGNOSIS — E1122 Type 2 diabetes mellitus with diabetic chronic kidney disease: Secondary | ICD-10-CM | POA: Diagnosis not present

## 2016-09-25 DIAGNOSIS — N2581 Secondary hyperparathyroidism of renal origin: Secondary | ICD-10-CM | POA: Diagnosis not present

## 2016-09-27 DIAGNOSIS — E1122 Type 2 diabetes mellitus with diabetic chronic kidney disease: Secondary | ICD-10-CM | POA: Diagnosis not present

## 2016-09-27 DIAGNOSIS — N186 End stage renal disease: Secondary | ICD-10-CM | POA: Diagnosis not present

## 2016-09-27 DIAGNOSIS — D631 Anemia in chronic kidney disease: Secondary | ICD-10-CM | POA: Diagnosis not present

## 2016-09-27 DIAGNOSIS — N2581 Secondary hyperparathyroidism of renal origin: Secondary | ICD-10-CM | POA: Diagnosis not present

## 2016-09-30 DIAGNOSIS — E1122 Type 2 diabetes mellitus with diabetic chronic kidney disease: Secondary | ICD-10-CM | POA: Diagnosis not present

## 2016-09-30 DIAGNOSIS — N186 End stage renal disease: Secondary | ICD-10-CM | POA: Diagnosis not present

## 2016-09-30 DIAGNOSIS — N2581 Secondary hyperparathyroidism of renal origin: Secondary | ICD-10-CM | POA: Diagnosis not present

## 2016-09-30 DIAGNOSIS — D631 Anemia in chronic kidney disease: Secondary | ICD-10-CM | POA: Diagnosis not present

## 2016-10-02 DIAGNOSIS — S72451D Displaced supracondylar fracture without intracondylar extension of lower end of right femur, subsequent encounter for closed fracture with routine healing: Secondary | ICD-10-CM | POA: Diagnosis not present

## 2016-10-02 DIAGNOSIS — D631 Anemia in chronic kidney disease: Secondary | ICD-10-CM | POA: Diagnosis not present

## 2016-10-02 DIAGNOSIS — N186 End stage renal disease: Secondary | ICD-10-CM | POA: Diagnosis not present

## 2016-10-02 DIAGNOSIS — N2581 Secondary hyperparathyroidism of renal origin: Secondary | ICD-10-CM | POA: Diagnosis not present

## 2016-10-02 DIAGNOSIS — E1122 Type 2 diabetes mellitus with diabetic chronic kidney disease: Secondary | ICD-10-CM | POA: Diagnosis not present

## 2016-10-03 DIAGNOSIS — I5042 Chronic combined systolic (congestive) and diastolic (congestive) heart failure: Secondary | ICD-10-CM | POA: Diagnosis not present

## 2016-10-03 DIAGNOSIS — N186 End stage renal disease: Secondary | ICD-10-CM | POA: Diagnosis not present

## 2016-10-03 DIAGNOSIS — S82191D Other fracture of upper end of right tibia, subsequent encounter for closed fracture with routine healing: Secondary | ICD-10-CM | POA: Diagnosis not present

## 2016-10-03 DIAGNOSIS — I132 Hypertensive heart and chronic kidney disease with heart failure and with stage 5 chronic kidney disease, or end stage renal disease: Secondary | ICD-10-CM | POA: Diagnosis not present

## 2016-10-03 DIAGNOSIS — S72324D Nondisplaced transverse fracture of shaft of right femur, subsequent encounter for closed fracture with routine healing: Secondary | ICD-10-CM | POA: Diagnosis not present

## 2016-10-03 DIAGNOSIS — E1122 Type 2 diabetes mellitus with diabetic chronic kidney disease: Secondary | ICD-10-CM | POA: Diagnosis not present

## 2016-10-04 DIAGNOSIS — E1122 Type 2 diabetes mellitus with diabetic chronic kidney disease: Secondary | ICD-10-CM | POA: Diagnosis not present

## 2016-10-04 DIAGNOSIS — N186 End stage renal disease: Secondary | ICD-10-CM | POA: Diagnosis not present

## 2016-10-04 DIAGNOSIS — D631 Anemia in chronic kidney disease: Secondary | ICD-10-CM | POA: Diagnosis not present

## 2016-10-04 DIAGNOSIS — N2581 Secondary hyperparathyroidism of renal origin: Secondary | ICD-10-CM | POA: Diagnosis not present

## 2016-10-07 DIAGNOSIS — E1122 Type 2 diabetes mellitus with diabetic chronic kidney disease: Secondary | ICD-10-CM | POA: Diagnosis not present

## 2016-10-07 DIAGNOSIS — D631 Anemia in chronic kidney disease: Secondary | ICD-10-CM | POA: Diagnosis not present

## 2016-10-07 DIAGNOSIS — N2581 Secondary hyperparathyroidism of renal origin: Secondary | ICD-10-CM | POA: Diagnosis not present

## 2016-10-07 DIAGNOSIS — N186 End stage renal disease: Secondary | ICD-10-CM | POA: Diagnosis not present

## 2016-10-07 DIAGNOSIS — S72451D Displaced supracondylar fracture without intracondylar extension of lower end of right femur, subsequent encounter for closed fracture with routine healing: Secondary | ICD-10-CM | POA: Diagnosis not present

## 2016-10-08 DIAGNOSIS — E1122 Type 2 diabetes mellitus with diabetic chronic kidney disease: Secondary | ICD-10-CM | POA: Diagnosis not present

## 2016-10-08 DIAGNOSIS — S72324D Nondisplaced transverse fracture of shaft of right femur, subsequent encounter for closed fracture with routine healing: Secondary | ICD-10-CM | POA: Diagnosis not present

## 2016-10-08 DIAGNOSIS — S82191D Other fracture of upper end of right tibia, subsequent encounter for closed fracture with routine healing: Secondary | ICD-10-CM | POA: Diagnosis not present

## 2016-10-08 DIAGNOSIS — N186 End stage renal disease: Secondary | ICD-10-CM | POA: Diagnosis not present

## 2016-10-08 DIAGNOSIS — I5042 Chronic combined systolic (congestive) and diastolic (congestive) heart failure: Secondary | ICD-10-CM | POA: Diagnosis not present

## 2016-10-08 DIAGNOSIS — I132 Hypertensive heart and chronic kidney disease with heart failure and with stage 5 chronic kidney disease, or end stage renal disease: Secondary | ICD-10-CM | POA: Diagnosis not present

## 2016-10-09 DIAGNOSIS — E1122 Type 2 diabetes mellitus with diabetic chronic kidney disease: Secondary | ICD-10-CM | POA: Diagnosis not present

## 2016-10-09 DIAGNOSIS — D631 Anemia in chronic kidney disease: Secondary | ICD-10-CM | POA: Diagnosis not present

## 2016-10-09 DIAGNOSIS — N186 End stage renal disease: Secondary | ICD-10-CM | POA: Diagnosis not present

## 2016-10-09 DIAGNOSIS — N2581 Secondary hyperparathyroidism of renal origin: Secondary | ICD-10-CM | POA: Diagnosis not present

## 2016-10-10 DIAGNOSIS — I132 Hypertensive heart and chronic kidney disease with heart failure and with stage 5 chronic kidney disease, or end stage renal disease: Secondary | ICD-10-CM | POA: Diagnosis not present

## 2016-10-10 DIAGNOSIS — I5042 Chronic combined systolic (congestive) and diastolic (congestive) heart failure: Secondary | ICD-10-CM | POA: Diagnosis not present

## 2016-10-10 DIAGNOSIS — S72324D Nondisplaced transverse fracture of shaft of right femur, subsequent encounter for closed fracture with routine healing: Secondary | ICD-10-CM | POA: Diagnosis not present

## 2016-10-10 DIAGNOSIS — E1122 Type 2 diabetes mellitus with diabetic chronic kidney disease: Secondary | ICD-10-CM | POA: Diagnosis not present

## 2016-10-10 DIAGNOSIS — N186 End stage renal disease: Secondary | ICD-10-CM | POA: Diagnosis not present

## 2016-10-10 DIAGNOSIS — S82191D Other fracture of upper end of right tibia, subsequent encounter for closed fracture with routine healing: Secondary | ICD-10-CM | POA: Diagnosis not present

## 2016-10-11 DIAGNOSIS — D631 Anemia in chronic kidney disease: Secondary | ICD-10-CM | POA: Diagnosis not present

## 2016-10-11 DIAGNOSIS — N186 End stage renal disease: Secondary | ICD-10-CM | POA: Diagnosis not present

## 2016-10-11 DIAGNOSIS — E1122 Type 2 diabetes mellitus with diabetic chronic kidney disease: Secondary | ICD-10-CM | POA: Diagnosis not present

## 2016-10-11 DIAGNOSIS — N2581 Secondary hyperparathyroidism of renal origin: Secondary | ICD-10-CM | POA: Diagnosis not present

## 2016-10-14 DIAGNOSIS — E1122 Type 2 diabetes mellitus with diabetic chronic kidney disease: Secondary | ICD-10-CM | POA: Diagnosis not present

## 2016-10-14 DIAGNOSIS — N186 End stage renal disease: Secondary | ICD-10-CM | POA: Diagnosis not present

## 2016-10-14 DIAGNOSIS — D631 Anemia in chronic kidney disease: Secondary | ICD-10-CM | POA: Diagnosis not present

## 2016-10-14 DIAGNOSIS — N2581 Secondary hyperparathyroidism of renal origin: Secondary | ICD-10-CM | POA: Diagnosis not present

## 2016-10-15 DIAGNOSIS — I5042 Chronic combined systolic (congestive) and diastolic (congestive) heart failure: Secondary | ICD-10-CM | POA: Diagnosis not present

## 2016-10-15 DIAGNOSIS — N186 End stage renal disease: Secondary | ICD-10-CM | POA: Diagnosis not present

## 2016-10-15 DIAGNOSIS — I132 Hypertensive heart and chronic kidney disease with heart failure and with stage 5 chronic kidney disease, or end stage renal disease: Secondary | ICD-10-CM | POA: Diagnosis not present

## 2016-10-15 DIAGNOSIS — E1129 Type 2 diabetes mellitus with other diabetic kidney complication: Secondary | ICD-10-CM | POA: Diagnosis not present

## 2016-10-15 DIAGNOSIS — S82191D Other fracture of upper end of right tibia, subsequent encounter for closed fracture with routine healing: Secondary | ICD-10-CM | POA: Diagnosis not present

## 2016-10-15 DIAGNOSIS — S72324D Nondisplaced transverse fracture of shaft of right femur, subsequent encounter for closed fracture with routine healing: Secondary | ICD-10-CM | POA: Diagnosis not present

## 2016-10-15 DIAGNOSIS — Z992 Dependence on renal dialysis: Secondary | ICD-10-CM | POA: Diagnosis not present

## 2016-10-15 DIAGNOSIS — E1122 Type 2 diabetes mellitus with diabetic chronic kidney disease: Secondary | ICD-10-CM | POA: Diagnosis not present

## 2016-10-16 DIAGNOSIS — N2581 Secondary hyperparathyroidism of renal origin: Secondary | ICD-10-CM | POA: Diagnosis not present

## 2016-10-16 DIAGNOSIS — E1122 Type 2 diabetes mellitus with diabetic chronic kidney disease: Secondary | ICD-10-CM | POA: Diagnosis not present

## 2016-10-16 DIAGNOSIS — D631 Anemia in chronic kidney disease: Secondary | ICD-10-CM | POA: Diagnosis not present

## 2016-10-16 DIAGNOSIS — D509 Iron deficiency anemia, unspecified: Secondary | ICD-10-CM | POA: Diagnosis not present

## 2016-10-16 DIAGNOSIS — N186 End stage renal disease: Secondary | ICD-10-CM | POA: Diagnosis not present

## 2016-10-17 DIAGNOSIS — I5042 Chronic combined systolic (congestive) and diastolic (congestive) heart failure: Secondary | ICD-10-CM | POA: Diagnosis not present

## 2016-10-17 DIAGNOSIS — S82191D Other fracture of upper end of right tibia, subsequent encounter for closed fracture with routine healing: Secondary | ICD-10-CM | POA: Diagnosis not present

## 2016-10-17 DIAGNOSIS — S72324D Nondisplaced transverse fracture of shaft of right femur, subsequent encounter for closed fracture with routine healing: Secondary | ICD-10-CM | POA: Diagnosis not present

## 2016-10-17 DIAGNOSIS — I132 Hypertensive heart and chronic kidney disease with heart failure and with stage 5 chronic kidney disease, or end stage renal disease: Secondary | ICD-10-CM | POA: Diagnosis not present

## 2016-10-17 DIAGNOSIS — E1122 Type 2 diabetes mellitus with diabetic chronic kidney disease: Secondary | ICD-10-CM | POA: Diagnosis not present

## 2016-10-17 DIAGNOSIS — N186 End stage renal disease: Secondary | ICD-10-CM | POA: Diagnosis not present

## 2016-10-18 DIAGNOSIS — E1122 Type 2 diabetes mellitus with diabetic chronic kidney disease: Secondary | ICD-10-CM | POA: Diagnosis not present

## 2016-10-18 DIAGNOSIS — N186 End stage renal disease: Secondary | ICD-10-CM | POA: Diagnosis not present

## 2016-10-18 DIAGNOSIS — D509 Iron deficiency anemia, unspecified: Secondary | ICD-10-CM | POA: Diagnosis not present

## 2016-10-18 DIAGNOSIS — N2581 Secondary hyperparathyroidism of renal origin: Secondary | ICD-10-CM | POA: Diagnosis not present

## 2016-10-18 DIAGNOSIS — D631 Anemia in chronic kidney disease: Secondary | ICD-10-CM | POA: Diagnosis not present

## 2016-10-21 DIAGNOSIS — N186 End stage renal disease: Secondary | ICD-10-CM | POA: Diagnosis not present

## 2016-10-21 DIAGNOSIS — E1122 Type 2 diabetes mellitus with diabetic chronic kidney disease: Secondary | ICD-10-CM | POA: Diagnosis not present

## 2016-10-21 DIAGNOSIS — N2581 Secondary hyperparathyroidism of renal origin: Secondary | ICD-10-CM | POA: Diagnosis not present

## 2016-10-21 DIAGNOSIS — D509 Iron deficiency anemia, unspecified: Secondary | ICD-10-CM | POA: Diagnosis not present

## 2016-10-21 DIAGNOSIS — D631 Anemia in chronic kidney disease: Secondary | ICD-10-CM | POA: Diagnosis not present

## 2016-10-23 DIAGNOSIS — D631 Anemia in chronic kidney disease: Secondary | ICD-10-CM | POA: Diagnosis not present

## 2016-10-23 DIAGNOSIS — E1122 Type 2 diabetes mellitus with diabetic chronic kidney disease: Secondary | ICD-10-CM | POA: Diagnosis not present

## 2016-10-23 DIAGNOSIS — N186 End stage renal disease: Secondary | ICD-10-CM | POA: Diagnosis not present

## 2016-10-23 DIAGNOSIS — N2581 Secondary hyperparathyroidism of renal origin: Secondary | ICD-10-CM | POA: Diagnosis not present

## 2016-10-23 DIAGNOSIS — D509 Iron deficiency anemia, unspecified: Secondary | ICD-10-CM | POA: Diagnosis not present

## 2016-10-24 DIAGNOSIS — I132 Hypertensive heart and chronic kidney disease with heart failure and with stage 5 chronic kidney disease, or end stage renal disease: Secondary | ICD-10-CM | POA: Diagnosis not present

## 2016-10-24 DIAGNOSIS — I5042 Chronic combined systolic (congestive) and diastolic (congestive) heart failure: Secondary | ICD-10-CM | POA: Diagnosis not present

## 2016-10-24 DIAGNOSIS — S82191D Other fracture of upper end of right tibia, subsequent encounter for closed fracture with routine healing: Secondary | ICD-10-CM | POA: Diagnosis not present

## 2016-10-24 DIAGNOSIS — E1122 Type 2 diabetes mellitus with diabetic chronic kidney disease: Secondary | ICD-10-CM | POA: Diagnosis not present

## 2016-10-24 DIAGNOSIS — S72324D Nondisplaced transverse fracture of shaft of right femur, subsequent encounter for closed fracture with routine healing: Secondary | ICD-10-CM | POA: Diagnosis not present

## 2016-10-24 DIAGNOSIS — N186 End stage renal disease: Secondary | ICD-10-CM | POA: Diagnosis not present

## 2016-10-25 DIAGNOSIS — D631 Anemia in chronic kidney disease: Secondary | ICD-10-CM | POA: Diagnosis not present

## 2016-10-25 DIAGNOSIS — N186 End stage renal disease: Secondary | ICD-10-CM | POA: Diagnosis not present

## 2016-10-25 DIAGNOSIS — N2581 Secondary hyperparathyroidism of renal origin: Secondary | ICD-10-CM | POA: Diagnosis not present

## 2016-10-25 DIAGNOSIS — E1122 Type 2 diabetes mellitus with diabetic chronic kidney disease: Secondary | ICD-10-CM | POA: Diagnosis not present

## 2016-10-25 DIAGNOSIS — D509 Iron deficiency anemia, unspecified: Secondary | ICD-10-CM | POA: Diagnosis not present

## 2016-10-28 DIAGNOSIS — N2581 Secondary hyperparathyroidism of renal origin: Secondary | ICD-10-CM | POA: Diagnosis not present

## 2016-10-28 DIAGNOSIS — D509 Iron deficiency anemia, unspecified: Secondary | ICD-10-CM | POA: Diagnosis not present

## 2016-10-28 DIAGNOSIS — E1122 Type 2 diabetes mellitus with diabetic chronic kidney disease: Secondary | ICD-10-CM | POA: Diagnosis not present

## 2016-10-28 DIAGNOSIS — D631 Anemia in chronic kidney disease: Secondary | ICD-10-CM | POA: Diagnosis not present

## 2016-10-28 DIAGNOSIS — N186 End stage renal disease: Secondary | ICD-10-CM | POA: Diagnosis not present

## 2016-10-29 DIAGNOSIS — I132 Hypertensive heart and chronic kidney disease with heart failure and with stage 5 chronic kidney disease, or end stage renal disease: Secondary | ICD-10-CM | POA: Diagnosis not present

## 2016-10-29 DIAGNOSIS — E1122 Type 2 diabetes mellitus with diabetic chronic kidney disease: Secondary | ICD-10-CM | POA: Diagnosis not present

## 2016-10-29 DIAGNOSIS — N186 End stage renal disease: Secondary | ICD-10-CM | POA: Diagnosis not present

## 2016-10-29 DIAGNOSIS — S82191D Other fracture of upper end of right tibia, subsequent encounter for closed fracture with routine healing: Secondary | ICD-10-CM | POA: Diagnosis not present

## 2016-10-29 DIAGNOSIS — I5042 Chronic combined systolic (congestive) and diastolic (congestive) heart failure: Secondary | ICD-10-CM | POA: Diagnosis not present

## 2016-10-29 DIAGNOSIS — S72324D Nondisplaced transverse fracture of shaft of right femur, subsequent encounter for closed fracture with routine healing: Secondary | ICD-10-CM | POA: Diagnosis not present

## 2016-10-30 DIAGNOSIS — N2581 Secondary hyperparathyroidism of renal origin: Secondary | ICD-10-CM | POA: Diagnosis not present

## 2016-10-30 DIAGNOSIS — D631 Anemia in chronic kidney disease: Secondary | ICD-10-CM | POA: Diagnosis not present

## 2016-10-30 DIAGNOSIS — S72451D Displaced supracondylar fracture without intracondylar extension of lower end of right femur, subsequent encounter for closed fracture with routine healing: Secondary | ICD-10-CM | POA: Diagnosis not present

## 2016-10-30 DIAGNOSIS — D509 Iron deficiency anemia, unspecified: Secondary | ICD-10-CM | POA: Diagnosis not present

## 2016-10-30 DIAGNOSIS — E1122 Type 2 diabetes mellitus with diabetic chronic kidney disease: Secondary | ICD-10-CM | POA: Diagnosis not present

## 2016-10-30 DIAGNOSIS — N186 End stage renal disease: Secondary | ICD-10-CM | POA: Diagnosis not present

## 2016-10-31 DIAGNOSIS — S72324D Nondisplaced transverse fracture of shaft of right femur, subsequent encounter for closed fracture with routine healing: Secondary | ICD-10-CM | POA: Diagnosis not present

## 2016-10-31 DIAGNOSIS — I5042 Chronic combined systolic (congestive) and diastolic (congestive) heart failure: Secondary | ICD-10-CM | POA: Diagnosis not present

## 2016-10-31 DIAGNOSIS — S82191D Other fracture of upper end of right tibia, subsequent encounter for closed fracture with routine healing: Secondary | ICD-10-CM | POA: Diagnosis not present

## 2016-10-31 DIAGNOSIS — E1122 Type 2 diabetes mellitus with diabetic chronic kidney disease: Secondary | ICD-10-CM | POA: Diagnosis not present

## 2016-10-31 DIAGNOSIS — N186 End stage renal disease: Secondary | ICD-10-CM | POA: Diagnosis not present

## 2016-10-31 DIAGNOSIS — I132 Hypertensive heart and chronic kidney disease with heart failure and with stage 5 chronic kidney disease, or end stage renal disease: Secondary | ICD-10-CM | POA: Diagnosis not present

## 2016-11-01 DIAGNOSIS — D631 Anemia in chronic kidney disease: Secondary | ICD-10-CM | POA: Diagnosis not present

## 2016-11-01 DIAGNOSIS — N2581 Secondary hyperparathyroidism of renal origin: Secondary | ICD-10-CM | POA: Diagnosis not present

## 2016-11-01 DIAGNOSIS — D509 Iron deficiency anemia, unspecified: Secondary | ICD-10-CM | POA: Diagnosis not present

## 2016-11-01 DIAGNOSIS — E1122 Type 2 diabetes mellitus with diabetic chronic kidney disease: Secondary | ICD-10-CM | POA: Diagnosis not present

## 2016-11-01 DIAGNOSIS — N186 End stage renal disease: Secondary | ICD-10-CM | POA: Diagnosis not present

## 2016-11-04 DIAGNOSIS — N2581 Secondary hyperparathyroidism of renal origin: Secondary | ICD-10-CM | POA: Diagnosis not present

## 2016-11-04 DIAGNOSIS — D631 Anemia in chronic kidney disease: Secondary | ICD-10-CM | POA: Diagnosis not present

## 2016-11-04 DIAGNOSIS — D509 Iron deficiency anemia, unspecified: Secondary | ICD-10-CM | POA: Diagnosis not present

## 2016-11-04 DIAGNOSIS — E1122 Type 2 diabetes mellitus with diabetic chronic kidney disease: Secondary | ICD-10-CM | POA: Diagnosis not present

## 2016-11-04 DIAGNOSIS — N186 End stage renal disease: Secondary | ICD-10-CM | POA: Diagnosis not present

## 2016-11-05 DIAGNOSIS — E1122 Type 2 diabetes mellitus with diabetic chronic kidney disease: Secondary | ICD-10-CM | POA: Diagnosis not present

## 2016-11-05 DIAGNOSIS — I132 Hypertensive heart and chronic kidney disease with heart failure and with stage 5 chronic kidney disease, or end stage renal disease: Secondary | ICD-10-CM | POA: Diagnosis not present

## 2016-11-05 DIAGNOSIS — I5042 Chronic combined systolic (congestive) and diastolic (congestive) heart failure: Secondary | ICD-10-CM | POA: Diagnosis not present

## 2016-11-05 DIAGNOSIS — S72324D Nondisplaced transverse fracture of shaft of right femur, subsequent encounter for closed fracture with routine healing: Secondary | ICD-10-CM | POA: Diagnosis not present

## 2016-11-05 DIAGNOSIS — S82191D Other fracture of upper end of right tibia, subsequent encounter for closed fracture with routine healing: Secondary | ICD-10-CM | POA: Diagnosis not present

## 2016-11-05 DIAGNOSIS — N186 End stage renal disease: Secondary | ICD-10-CM | POA: Diagnosis not present

## 2016-11-06 DIAGNOSIS — D631 Anemia in chronic kidney disease: Secondary | ICD-10-CM | POA: Diagnosis not present

## 2016-11-06 DIAGNOSIS — D509 Iron deficiency anemia, unspecified: Secondary | ICD-10-CM | POA: Diagnosis not present

## 2016-11-06 DIAGNOSIS — N2581 Secondary hyperparathyroidism of renal origin: Secondary | ICD-10-CM | POA: Diagnosis not present

## 2016-11-06 DIAGNOSIS — N186 End stage renal disease: Secondary | ICD-10-CM | POA: Diagnosis not present

## 2016-11-06 DIAGNOSIS — E1122 Type 2 diabetes mellitus with diabetic chronic kidney disease: Secondary | ICD-10-CM | POA: Diagnosis not present

## 2016-11-07 ENCOUNTER — Ambulatory Visit (INDEPENDENT_AMBULATORY_CARE_PROVIDER_SITE_OTHER): Payer: Medicare Other | Admitting: Infectious Diseases

## 2016-11-07 ENCOUNTER — Encounter: Payer: Self-pay | Admitting: Infectious Diseases

## 2016-11-07 VITALS — BP 183/63 | HR 52 | Temp 98.1°F | Ht 61.0 in | Wt 129.0 lb

## 2016-11-07 DIAGNOSIS — T148XXD Other injury of unspecified body region, subsequent encounter: Secondary | ICD-10-CM | POA: Diagnosis not present

## 2016-11-07 LAB — CBC
HCT: 34.6 % — ABNORMAL LOW (ref 35.0–45.0)
Hemoglobin: 10.6 g/dL — ABNORMAL LOW (ref 11.7–15.5)
MCH: 29.7 pg (ref 27.0–33.0)
MCHC: 30.6 g/dL — ABNORMAL LOW (ref 32.0–36.0)
MCV: 96.9 fL (ref 80.0–100.0)
MPV: 8.9 fL (ref 7.5–12.5)
PLATELETS: 312 10*3/uL (ref 140–400)
RBC: 3.57 MIL/uL — ABNORMAL LOW (ref 3.80–5.10)
RDW: 17 % — AB (ref 11.0–15.0)
WBC: 5 10*3/uL (ref 3.8–10.8)

## 2016-11-07 NOTE — Progress Notes (Signed)
Patient Name: [name]  MRN: [id number]  DOB: 1928/10/24    Reason for Visit: Chronic knee wound s/p right supracondylar femural nail for Fx  Referring Provider: Dr. Alain Marion   INFECTIOUS DISEASE OFFICE CONSULT SUBJECTIVE:  HPI: [name] is a 83 y.o. female that is here today for consideration of chronic antibiotic suppression of her anterior knee wound that has bene chronic. She is s/p right supracondylar femural nail after having sustained a fall in May 2018. Several years ago she fell and required tibial nail several years ago and apparently a FiberWire suture was noted to have been retained from a previous surgery. Underwent skin graft at [facility] which failed. Wound   Does not check BS at home only goes for HgbA1C - not sure what results are. Last on Doxycycline in June when her wound was actively draining. It has been dry for about a month now; continues to keep exposed areas covered with bandaids to prevent friction from her jeans. No significant pain only some stiffness to the right knee. Denis any fevers, chills or warmth to the knee itself but does endorse swelling to RLE and R knee. She requires use of rolling or stationary walker for balance and pain. Receives dialysis via LUE AVG. No problems or concerns with her site.   She is very clear that she is not at all interested in any surgical procedures orthopedic or plastic surgery could offer her.   Patient Active Problem List   Diagnosis Date Noted  . Wound healing, delayed 11/08/2016  . Leg fracture 08/13/2016  . Hypothyroidism 08/30/2015  . Labile blood glucose   . Memory loss   . Hip fracture ([place]) 07/12/2015  . Swelling   . ESRD on dialysis ([place])   . PAF (paroxysmal atrial fibrillation) ([place])   . Chronic combined systolic and diastolic heart failure EF 45-50% by echo 06/25/2012 07/30/2012  . Breast cancer, s/p Lt mastectomy 07/22/2012  . Pleural effusion due to CHF (congestive heart failure) ([place]) 07/20/2012   . Hypotension, unspecified 06/25/2012  . Right tibial fracture 06/21/2012  . Metabolic bone disease 78/29/5621  . Secondary hyperparathyroidism of renal origin ([place]) 06/19/2012  . Nonunion of fracture, R tibia 06/17/2012  . H/O radioactive iodine thyroid ablation   . Closed fracture of right fibula and tibia 07/10/2011  . HTN (hypertension) 07/10/2011  . DM2 (diabetes mellitus, type 2) ([place]) 07/10/2011  . Hyperlipidemia 07/10/2011  . Anemia 07/10/2011    Patient's Medications  New Prescriptions   No medications on file  Previous Medications   ACETAMINOPHEN (TYLENOL) 325 MG TABLET    Take 2 tablets (650 mg total) by mouth every 6 (six) hours as needed for mild pain or moderate pain.   ASPIRIN EC 325 MG TABLET    Take 1 tablet (325 mg total) by mouth daily. For 30 days post op for DVT Prophylaxis.  Resume 81 mg Aspirin after this has been completed.   B COMPLEX-VITAMIN C-FOLIC ACID (NEPHRO-VITE) 0.8 MG TABS TABLET    Take 1 tablet by mouth at bedtime.   CHOLECALCIFEROL (VITAMIN D-3 PO)    Take 1 capsule by mouth daily.   DARBEPOETIN (ARANESP) 100 MCG/0.5ML SOLN    Inject 0.5 mLs (100 mcg total) into the vein every Wednesday with hemodialysis.   DICLOFENAC SODIUM (VOLTAREN) 1 % GEL    Apply 2 g topically 3 (three) times daily.   GLIMEPIRIDE (AMARYL) 1 MG TABLET    TAKE 3 TABLETS(3 MG) BY MOUTH  DAILY WITH BREAKFAST   HYDRALAZINE (APRESOLINE) 50 MG TABLET    TAKE 1 TABLET(50 MG) BY MOUTH TWICE DAILY   ISOSORBIDE MONONITRATE (IMDUR) 30 MG 24 HR TABLET    Take 1 tablet (30 mg total) by mouth daily.   LEVOTHYROXINE (SYNTHROID, LEVOTHROID) 75 MCG TABLET    Take 1 tablet (75 mcg total) by mouth daily before breakfast. --- Office visit needed for further refills   METHOCARBAMOL (ROBAXIN) 500 MG TABLET    Take 1 tablet (500 mg total) by mouth every 6 (six) hours as needed for muscle spasms.   NEBIVOLOL (BYSTOLIC) 10 MG TABLET    Take 1 tablet (10 mg total) by mouth daily.   NUTRITIONAL SUPPLEMENTS  (FEEDING SUPPLEMENT, NEPRO CARB STEADY,) LIQD    Take 237 mLs by mouth 3 (three) times daily as needed (Supplement).   OMEPRAZOLE (PRILOSEC) 20 MG CAPSULE    Take 1 capsule (20 mg total) by mouth daily. While taking anti inflammatory medicine daily   PRAVASTATIN (PRAVACHOL) 20 MG TABLET    Take 1 tablet (20 mg total) by mouth daily at 6 PM.   SEVELAMER CARBONATE (RENVELA) 800 MG TABLET    Take 1 tablet (800 mg total) by mouth 3 (three) times daily with meals.   VITAMIN E 200 UNIT CAPSULE    Take 200 Units by mouth daily.  Modified Medications   No medications on file  Discontinued Medications   AMINO ACIDS-PROTEIN HYDROLYS (FEEDING SUPPLEMENT, PRO-STAT SUGAR FREE 64,) LIQD    Take 30 mLs by mouth 2 (two) times daily.    Review of Systems  Constitutional: Negative for chills, fever, malaise/fatigue and weight loss.  HENT: Negative for sore throat.        No dental problems  Respiratory: Negative for cough and sputum production.   Cardiovascular: Positive for leg swelling. Negative for chest pain.  Gastrointestinal: Negative for abdominal pain, diarrhea and vomiting.  Musculoskeletal: Positive for joint pain. Negative for myalgias and neck pain.  Skin: Negative for rash.  Neurological: Negative for dizziness, tingling and headaches.  Psychiatric/Behavioral: Negative for depression and substance abuse. The patient is not nervous/anxious and does not have insomnia.     Past Medical History:  Diagnosis Date  . Arthritis   . Atrial fibrillation ([place]) 06/25/2012   2D Echo - EF 45-50, left atrium severely dilated, mitral valve heavily calcified annulus with calcification of the subvalvular apparatus - no longer on Coumadin  . Breast cancer ([place])   . CHF (congestive heart failure) ([place])   . DM2 (diabetes mellitus, type 2) ([place])   . ESRD (end stage renal disease) ([place])    HD MWF - [facility]  . HTN (hypertension)   . Hyperlipidemia   . Hyperparathyroidism, secondary renal ([place])   .  Hypothyroid   . Shortness of breath   . Tibia/fibula fracture 07/10/2011   right - nonunion repair 06/2012    Social History  Substance Use Topics  . Smoking status: Never Smoker  . Smokeless tobacco: Current User    Types: Snuff  . Alcohol use No    Family History  Problem Relation Age of Onset  . Hyperlipidemia Father   . Diabetes Father   . Breast cancer Sister      Allergies  Allergen Reactions  . Codeine Shortness Of Breath, Swelling and Other (See Comments)    Swelling of the tongue. Pt tolerates Vicodin.  . Coreg [Carvedilol] Other (See Comments)    Headaches   . Lopressor [Metoprolol Tartrate] Other (  See Comments)    GI upset  . Metoprolol Other (See Comments)    GI upset  . Oxycodone Other (See Comments)    Hallucinations (also with Oxycontin)     OBJECTIVE: Vitals:   11/07/16 1423  BP: (!) 183/63  Pulse: (!) 52  Temp: 98.1 F (36.7 C)  TempSrc: Oral  Weight: 129 lb (58.5 kg)  Height: _0  (1.549 m)   Body mass index is 24.37 kg/m.  Physical Exam  Constitutional: She is well-developed, well-nourished, and in no distress.  Eyes: No scleral icterus.  Cardiovascular: Normal rate, regular rhythm and normal heart sounds.   No murmur heard. Pulmonary/Chest: Effort normal and breath sounds normal. No respiratory distress.  Abdominal: Soft. Bowel sounds are normal. She exhibits no distension. There is no tenderness.  Musculoskeletal:       Right knee: She exhibits swelling and bony tenderness. She exhibits normal range of motion and no erythema.       Left knee: Normal.       Legs:     ASSESSMENT & PLAN:  Problem List Items Addressed This Visit      Other   Wound healing, delayed - Primary    Chronic open wound with exposed bone, present > 1 year: with AVG and retained hardware from previous fx repairs, age, co morbidities and overall risk for infection I do agree that suppressive antibiotics would be in her best interest. No organism has been  identified previously from what I can tell; has had resolution of drainage in recent past from doxycycline. With the duration of her wound I think it is important to assess her knee with MRI to get a good look at the bone for any indications of osteomyelitis that would require treatment before we consider prolonged suppressive therapy. Will also obtain CBC, CRP and Sed Rate today for baseline. Previously ABI normal limits and venous duplex negative. Will have her come back to discuss labs and imaging in a few weeks to determine next steps for treatment.       Relevant Orders   C-reactive protein (Completed)   Sed Rate (ESR) (Completed)   CBC (Completed)   Comp Met (CMET) (Completed)   MR KNEE RIGHT WO CONTRAST      Janene Madeira, MSN, [facility]  11/08/2016  1:05 PM

## 2016-11-07 NOTE — Patient Instructions (Addendum)
With exposed bone it is a good idea to make sure there is not underlying infection that may require treatment before we opt to do long term therapy with antibiotics.   We will check some blood work today to see if there is suspected infection and arrange for imaging of your knee.   We will arrange for follow up after we get your labs and information back.

## 2016-11-08 DIAGNOSIS — N186 End stage renal disease: Secondary | ICD-10-CM | POA: Diagnosis not present

## 2016-11-08 DIAGNOSIS — D631 Anemia in chronic kidney disease: Secondary | ICD-10-CM | POA: Diagnosis not present

## 2016-11-08 DIAGNOSIS — T148XXD Other injury of unspecified body region, subsequent encounter: Secondary | ICD-10-CM | POA: Insufficient documentation

## 2016-11-08 DIAGNOSIS — E1122 Type 2 diabetes mellitus with diabetic chronic kidney disease: Secondary | ICD-10-CM | POA: Diagnosis not present

## 2016-11-08 DIAGNOSIS — D509 Iron deficiency anemia, unspecified: Secondary | ICD-10-CM | POA: Diagnosis not present

## 2016-11-08 DIAGNOSIS — N2581 Secondary hyperparathyroidism of renal origin: Secondary | ICD-10-CM | POA: Diagnosis not present

## 2016-11-08 LAB — COMPREHENSIVE METABOLIC PANEL
ALK PHOS: 157 U/L — AB (ref 33–130)
ALT: 7 U/L (ref 6–29)
AST: 19 U/L (ref 10–35)
Albumin: 3.4 g/dL — ABNORMAL LOW (ref 3.6–5.1)
BUN: 25 mg/dL (ref 7–25)
CHLORIDE: 97 mmol/L — AB (ref 98–110)
CO2: 21 mmol/L (ref 20–32)
Calcium: 8.6 mg/dL (ref 8.6–10.4)
Creat: 5.09 mg/dL — ABNORMAL HIGH (ref 0.60–0.88)
GLUCOSE: 167 mg/dL — AB (ref 65–99)
POTASSIUM: 3.2 mmol/L — AB (ref 3.5–5.3)
Sodium: 141 mmol/L (ref 135–146)
TOTAL PROTEIN: 6.2 g/dL (ref 6.1–8.1)
Total Bilirubin: 0.6 mg/dL (ref 0.2–1.2)

## 2016-11-08 LAB — SEDIMENTATION RATE: SED RATE: 30 mm/h (ref 0–30)

## 2016-11-08 LAB — C-REACTIVE PROTEIN: CRP: 18.3 mg/L — AB (ref <8.0)

## 2016-11-08 NOTE — Assessment & Plan Note (Addendum)
Chronic open wound with exposed bone, present > 1 year: with AVG and retained hardware from previous fx repairs, age, co morbidities and overall risk for infection I do agree that suppressive antibiotics would be in her best interest. No organism has been identified previously from what I can tell; has had resolution of drainage in recent past from doxycycline. With the duration of her wound I think it is important to assess her knee with MRI to get a good look at the bone for any indications of osteomyelitis that would require treatment before we consider prolonged suppressive therapy. Will also obtain CBC, CRP and Sed Rate today for baseline. Previously ABI normal limits and venous duplex negative. Will have her come back to discuss labs and imaging in a few weeks to determine next steps for treatment.

## 2016-11-11 ENCOUNTER — Ambulatory Visit (HOSPITAL_COMMUNITY): Admission: RE | Admit: 2016-11-11 | Payer: Medicare Other | Source: Ambulatory Visit

## 2016-11-11 ENCOUNTER — Ambulatory Visit (HOSPITAL_COMMUNITY)
Admission: RE | Admit: 2016-11-11 | Discharge: 2016-11-11 | Disposition: A | Discharge status: Home / Self Care | Payer: Medicare Other | Source: Ambulatory Visit | Attending: Infectious Diseases | Admitting: Infectious Diseases

## 2016-11-11 DIAGNOSIS — X58XXXD Exposure to other specified factors, subsequent encounter: Secondary | ICD-10-CM | POA: Insufficient documentation

## 2016-11-11 DIAGNOSIS — M25461 Effusion, right knee: Secondary | ICD-10-CM | POA: Insufficient documentation

## 2016-11-11 DIAGNOSIS — M7121 Synovial cyst of popliteal space [Baker], right knee: Secondary | ICD-10-CM | POA: Diagnosis not present

## 2016-11-11 DIAGNOSIS — N186 End stage renal disease: Secondary | ICD-10-CM | POA: Diagnosis not present

## 2016-11-11 DIAGNOSIS — S72491G Other fracture of lower end of right femur, subsequent encounter for closed fracture with delayed healing: Secondary | ICD-10-CM | POA: Diagnosis not present

## 2016-11-11 DIAGNOSIS — D509 Iron deficiency anemia, unspecified: Secondary | ICD-10-CM | POA: Diagnosis not present

## 2016-11-11 DIAGNOSIS — T148XXD Other injury of unspecified body region, subsequent encounter: Secondary | ICD-10-CM | POA: Diagnosis not present

## 2016-11-11 DIAGNOSIS — D631 Anemia in chronic kidney disease: Secondary | ICD-10-CM | POA: Diagnosis not present

## 2016-11-11 DIAGNOSIS — E1122 Type 2 diabetes mellitus with diabetic chronic kidney disease: Secondary | ICD-10-CM | POA: Diagnosis not present

## 2016-11-11 DIAGNOSIS — N2581 Secondary hyperparathyroidism of renal origin: Secondary | ICD-10-CM | POA: Diagnosis not present

## 2016-11-13 DIAGNOSIS — D631 Anemia in chronic kidney disease: Secondary | ICD-10-CM | POA: Diagnosis not present

## 2016-11-13 DIAGNOSIS — N2581 Secondary hyperparathyroidism of renal origin: Secondary | ICD-10-CM | POA: Diagnosis not present

## 2016-11-13 DIAGNOSIS — N186 End stage renal disease: Secondary | ICD-10-CM | POA: Diagnosis not present

## 2016-11-13 DIAGNOSIS — E1122 Type 2 diabetes mellitus with diabetic chronic kidney disease: Secondary | ICD-10-CM | POA: Diagnosis not present

## 2016-11-13 DIAGNOSIS — D509 Iron deficiency anemia, unspecified: Secondary | ICD-10-CM | POA: Diagnosis not present

## 2016-11-15 DIAGNOSIS — D631 Anemia in chronic kidney disease: Secondary | ICD-10-CM | POA: Diagnosis not present

## 2016-11-15 DIAGNOSIS — E1129 Type 2 diabetes mellitus with other diabetic kidney complication: Secondary | ICD-10-CM | POA: Diagnosis not present

## 2016-11-15 DIAGNOSIS — Z992 Dependence on renal dialysis: Secondary | ICD-10-CM | POA: Diagnosis not present

## 2016-11-15 DIAGNOSIS — E1122 Type 2 diabetes mellitus with diabetic chronic kidney disease: Secondary | ICD-10-CM | POA: Diagnosis not present

## 2016-11-15 DIAGNOSIS — N2581 Secondary hyperparathyroidism of renal origin: Secondary | ICD-10-CM | POA: Diagnosis not present

## 2016-11-15 DIAGNOSIS — N186 End stage renal disease: Secondary | ICD-10-CM | POA: Diagnosis not present

## 2016-11-15 DIAGNOSIS — D509 Iron deficiency anemia, unspecified: Secondary | ICD-10-CM | POA: Diagnosis not present

## 2016-11-18 DIAGNOSIS — N186 End stage renal disease: Secondary | ICD-10-CM | POA: Diagnosis not present

## 2016-11-18 DIAGNOSIS — D631 Anemia in chronic kidney disease: Secondary | ICD-10-CM | POA: Diagnosis not present

## 2016-11-18 DIAGNOSIS — N2581 Secondary hyperparathyroidism of renal origin: Secondary | ICD-10-CM | POA: Diagnosis not present

## 2016-11-18 DIAGNOSIS — Z23 Encounter for immunization: Secondary | ICD-10-CM | POA: Diagnosis not present

## 2016-11-18 DIAGNOSIS — E1122 Type 2 diabetes mellitus with diabetic chronic kidney disease: Secondary | ICD-10-CM | POA: Diagnosis not present

## 2016-11-18 DIAGNOSIS — D509 Iron deficiency anemia, unspecified: Secondary | ICD-10-CM | POA: Diagnosis not present

## 2016-11-20 DIAGNOSIS — N186 End stage renal disease: Secondary | ICD-10-CM | POA: Diagnosis not present

## 2016-11-20 DIAGNOSIS — D631 Anemia in chronic kidney disease: Secondary | ICD-10-CM | POA: Diagnosis not present

## 2016-11-20 DIAGNOSIS — E1122 Type 2 diabetes mellitus with diabetic chronic kidney disease: Secondary | ICD-10-CM | POA: Diagnosis not present

## 2016-11-20 DIAGNOSIS — Z23 Encounter for immunization: Secondary | ICD-10-CM | POA: Diagnosis not present

## 2016-11-20 DIAGNOSIS — D509 Iron deficiency anemia, unspecified: Secondary | ICD-10-CM | POA: Diagnosis not present

## 2016-11-20 DIAGNOSIS — N2581 Secondary hyperparathyroidism of renal origin: Secondary | ICD-10-CM | POA: Diagnosis not present

## 2016-11-22 DIAGNOSIS — D509 Iron deficiency anemia, unspecified: Secondary | ICD-10-CM | POA: Diagnosis not present

## 2016-11-22 DIAGNOSIS — N2581 Secondary hyperparathyroidism of renal origin: Secondary | ICD-10-CM | POA: Diagnosis not present

## 2016-11-22 DIAGNOSIS — N186 End stage renal disease: Secondary | ICD-10-CM | POA: Diagnosis not present

## 2016-11-22 DIAGNOSIS — D631 Anemia in chronic kidney disease: Secondary | ICD-10-CM | POA: Diagnosis not present

## 2016-11-22 DIAGNOSIS — Z23 Encounter for immunization: Secondary | ICD-10-CM | POA: Diagnosis not present

## 2016-11-22 DIAGNOSIS — E1122 Type 2 diabetes mellitus with diabetic chronic kidney disease: Secondary | ICD-10-CM | POA: Diagnosis not present

## 2016-11-25 DIAGNOSIS — D631 Anemia in chronic kidney disease: Secondary | ICD-10-CM | POA: Diagnosis not present

## 2016-11-25 DIAGNOSIS — N2581 Secondary hyperparathyroidism of renal origin: Secondary | ICD-10-CM | POA: Diagnosis not present

## 2016-11-25 DIAGNOSIS — E1122 Type 2 diabetes mellitus with diabetic chronic kidney disease: Secondary | ICD-10-CM | POA: Diagnosis not present

## 2016-11-25 DIAGNOSIS — Z23 Encounter for immunization: Secondary | ICD-10-CM | POA: Diagnosis not present

## 2016-11-25 DIAGNOSIS — D509 Iron deficiency anemia, unspecified: Secondary | ICD-10-CM | POA: Diagnosis not present

## 2016-11-25 DIAGNOSIS — N186 End stage renal disease: Secondary | ICD-10-CM | POA: Diagnosis not present

## 2016-11-27 DIAGNOSIS — D631 Anemia in chronic kidney disease: Secondary | ICD-10-CM | POA: Diagnosis not present

## 2016-11-27 DIAGNOSIS — D509 Iron deficiency anemia, unspecified: Secondary | ICD-10-CM | POA: Diagnosis not present

## 2016-11-27 DIAGNOSIS — N186 End stage renal disease: Secondary | ICD-10-CM | POA: Diagnosis not present

## 2016-11-27 DIAGNOSIS — E1122 Type 2 diabetes mellitus with diabetic chronic kidney disease: Secondary | ICD-10-CM | POA: Diagnosis not present

## 2016-11-27 DIAGNOSIS — Z23 Encounter for immunization: Secondary | ICD-10-CM | POA: Diagnosis not present

## 2016-11-27 DIAGNOSIS — N2581 Secondary hyperparathyroidism of renal origin: Secondary | ICD-10-CM | POA: Diagnosis not present

## 2016-11-29 DIAGNOSIS — N2581 Secondary hyperparathyroidism of renal origin: Secondary | ICD-10-CM | POA: Diagnosis not present

## 2016-11-29 DIAGNOSIS — E1122 Type 2 diabetes mellitus with diabetic chronic kidney disease: Secondary | ICD-10-CM | POA: Diagnosis not present

## 2016-11-29 DIAGNOSIS — D631 Anemia in chronic kidney disease: Secondary | ICD-10-CM | POA: Diagnosis not present

## 2016-11-29 DIAGNOSIS — N186 End stage renal disease: Secondary | ICD-10-CM | POA: Diagnosis not present

## 2016-11-29 DIAGNOSIS — Z23 Encounter for immunization: Secondary | ICD-10-CM | POA: Diagnosis not present

## 2016-11-29 DIAGNOSIS — D509 Iron deficiency anemia, unspecified: Secondary | ICD-10-CM | POA: Diagnosis not present

## 2016-12-02 DIAGNOSIS — N186 End stage renal disease: Secondary | ICD-10-CM | POA: Diagnosis not present

## 2016-12-02 DIAGNOSIS — N2581 Secondary hyperparathyroidism of renal origin: Secondary | ICD-10-CM | POA: Diagnosis not present

## 2016-12-02 DIAGNOSIS — Z23 Encounter for immunization: Secondary | ICD-10-CM | POA: Diagnosis not present

## 2016-12-02 DIAGNOSIS — D509 Iron deficiency anemia, unspecified: Secondary | ICD-10-CM | POA: Diagnosis not present

## 2016-12-02 DIAGNOSIS — E1122 Type 2 diabetes mellitus with diabetic chronic kidney disease: Secondary | ICD-10-CM | POA: Diagnosis not present

## 2016-12-02 DIAGNOSIS — D631 Anemia in chronic kidney disease: Secondary | ICD-10-CM | POA: Diagnosis not present

## 2016-12-04 DIAGNOSIS — D631 Anemia in chronic kidney disease: Secondary | ICD-10-CM | POA: Diagnosis not present

## 2016-12-04 DIAGNOSIS — Z23 Encounter for immunization: Secondary | ICD-10-CM | POA: Diagnosis not present

## 2016-12-04 DIAGNOSIS — N2581 Secondary hyperparathyroidism of renal origin: Secondary | ICD-10-CM | POA: Diagnosis not present

## 2016-12-04 DIAGNOSIS — D509 Iron deficiency anemia, unspecified: Secondary | ICD-10-CM | POA: Diagnosis not present

## 2016-12-04 DIAGNOSIS — N186 End stage renal disease: Secondary | ICD-10-CM | POA: Diagnosis not present

## 2016-12-04 DIAGNOSIS — E1122 Type 2 diabetes mellitus with diabetic chronic kidney disease: Secondary | ICD-10-CM | POA: Diagnosis not present

## 2016-12-05 ENCOUNTER — Encounter: Payer: Self-pay | Admitting: Infectious Diseases

## 2016-12-05 ENCOUNTER — Ambulatory Visit (INDEPENDENT_AMBULATORY_CARE_PROVIDER_SITE_OTHER): Payer: Medicare Other | Admitting: Infectious Diseases

## 2016-12-05 VITALS — Temp 97.7°F | Wt 131.0 lb

## 2016-12-05 DIAGNOSIS — T148XXD Other injury of unspecified body region, subsequent encounter: Secondary | ICD-10-CM

## 2016-12-05 DIAGNOSIS — N186 End stage renal disease: Secondary | ICD-10-CM | POA: Diagnosis not present

## 2016-12-05 DIAGNOSIS — Z992 Dependence on renal dialysis: Secondary | ICD-10-CM

## 2016-12-05 DIAGNOSIS — Z792 Long term (current) use of antibiotics: Secondary | ICD-10-CM | POA: Diagnosis not present

## 2016-12-05 MED ORDER — DOXYCYCLINE HYCLATE 100 MG PO TABS: 100.0000 mg | ORAL_TABLET | Freq: Two times a day (BID) | ORAL | 5 refills | Status: DC

## 2016-12-05 NOTE — Progress Notes (Signed)
Patient Name: [name]  MRN: [id number]  DOB: 07-20-1928    Reason for Visit: Chronic knee wound s/p right supracondylar femural nail for Fx  Referring Provider: Dr. Alain Marion    INFECTIOUS DISEASE OFFICE OFFICE VISIT SUBJECTIVE:   HPI: [name] is a 83 y.o. female that is here today for consideration of chronic antibiotic suppression of her anterior knee wound that has bene chronic. She is s/p right supracondylar femural nail after having sustained a fall in May 2018. Several years ago she fell and required tibial nail several years ago and apparently a FiberWire suture was noted to have been retained from a previous surgery. Underwent skin graft at [facility] which failed. Last antibiotic therapy was in June 2018 when it was actively draining. She is very clear that she is not at all interested in any surgical procedures orthopedic or plastic surgery could offer her.   Interval history unchanged since last visit. She is here with her husband today. Denis any fevers, chills or warmth to the knee and swelling overall improved. She requires use of rolling or stationary walker for balance and pain - no falls recently. Currently still wrapping knee to help swelling and hopeful she can stop. Receives dialysis via RUE AVG. No problems or concerns with her site. Anxious to know MRI results.    Patient's Medications  New Prescriptions   DOXYCYCLINE (VIBRA-TABS) 100 MG TABLET    Take 1 tablet (100 mg total) by mouth 2 (two) times daily.  Previous Medications   ACETAMINOPHEN (TYLENOL) 325 MG TABLET    Take 2 tablets (650 mg total) by mouth every 6 (six) hours as needed for mild pain or moderate pain.   ASPIRIN EC 325 MG TABLET    Take 1 tablet (325 mg total) by mouth daily. For 30 days post op for DVT Prophylaxis.  Resume 81 mg Aspirin after this has been completed.   B COMPLEX-VITAMIN C-FOLIC ACID (NEPHRO-VITE) 0.8 MG TABS TABLET    Take 1 tablet by mouth at bedtime.   CHOLECALCIFEROL  (VITAMIN D-3 PO)    Take 1 capsule by mouth daily.   DARBEPOETIN (ARANESP) 100 MCG/0.5ML SOLN    Inject 0.5 mLs (100 mcg total) into the vein every Wednesday with hemodialysis.   DICLOFENAC SODIUM (VOLTAREN) 1 % GEL    Apply 2 g topically 3 (three) times daily.   GLIMEPIRIDE (AMARYL) 1 MG TABLET    TAKE 3 TABLETS(3 MG) BY MOUTH DAILY WITH BREAKFAST   HYDRALAZINE (APRESOLINE) 50 MG TABLET    TAKE 1 TABLET(50 MG) BY MOUTH TWICE DAILY   ISOSORBIDE MONONITRATE (IMDUR) 30 MG 24 HR TABLET    Take 1 tablet (30 mg total) by mouth daily.   LEVOTHYROXINE (SYNTHROID, LEVOTHROID) 75 MCG TABLET    Take 1 tablet (75 mcg total) by mouth daily before breakfast. --- Office visit needed for further refills   METHOCARBAMOL (ROBAXIN) 500 MG TABLET    Take 1 tablet (500 mg total) by mouth every 6 (six) hours as needed for muscle spasms.   NEBIVOLOL (BYSTOLIC) 10 MG TABLET    Take 1 tablet (10 mg total) by mouth daily.   NUTRITIONAL SUPPLEMENTS (FEEDING SUPPLEMENT, NEPRO CARB STEADY,) LIQD    Take 237 mLs by mouth 3 (three) times daily as needed (Supplement).   OMEPRAZOLE (PRILOSEC) 20 MG CAPSULE    Take 1 capsule (20 mg total) by mouth daily. While taking anti inflammatory medicine daily   PRAVASTATIN (PRAVACHOL) 20 MG TABLET  Take 1 tablet (20 mg total) by mouth daily at 6 PM.   SEVELAMER CARBONATE (RENVELA) 800 MG TABLET    Take 1 tablet (800 mg total) by mouth 3 (three) times daily with meals.   VITAMIN E 200 UNIT CAPSULE    Take 200 Units by mouth daily.  Modified Medications   No medications on file  Discontinued Medications   No medications on file    Review of Systems  Constitutional: Negative for chills, fever, malaise/fatigue and weight loss.  HENT: Negative for sore throat.   Respiratory: Negative for cough and sputum production.   Cardiovascular: Positive for leg swelling. Negative for chest pain.  Gastrointestinal: Negative for abdominal pain, diarrhea and vomiting.  Musculoskeletal: Positive for  joint pain. Negative for myalgias and neck pain.  Skin: Negative for rash.  Neurological: Negative for dizziness, tingling and headaches.  Psychiatric/Behavioral: Negative for depression and substance abuse. The patient is not nervous/anxious and does not have insomnia.     Past Medical History:  Diagnosis Date  . Arthritis   . Atrial fibrillation ([place]) 06/25/2012   2D Echo - EF 45-50, left atrium severely dilated, mitral valve heavily calcified annulus with calcification of the subvalvular apparatus - no longer on Coumadin  . Breast cancer ([place])   . CHF (congestive heart failure) ([place])   . DM2 (diabetes mellitus, type 2) ([place])   . ESRD (end stage renal disease) ([place])    HD MWF - [facility]  . HTN (hypertension)   . Hyperlipidemia   . Hyperparathyroidism, secondary renal ([place])   . Hypothyroid   . Shortness of breath   . Tibia/fibula fracture 07/10/2011   right - nonunion repair 06/2012    Allergies  Allergen Reactions  . Codeine Shortness Of Breath, Swelling and Other (See Comments)    Swelling of the tongue. Pt tolerates Vicodin.  . Coreg [Carvedilol] Other (See Comments)    Headaches   . Lopressor [Metoprolol Tartrate] Other (See Comments)    GI upset  . Metoprolol Other (See Comments)    GI upset  . Oxycodone Other (See Comments)    Hallucinations (also with Oxycontin)    OBJECTIVE: Vitals:   12/05/16 1409  Temp: 97.7 F (36.5 C)  TempSrc: Oral  Weight: 131 lb (59.4 kg)   Body mass index is 24.75 kg/m.  Physical Exam  Constitutional: She is oriented to person, place, and time and well-developed, well-nourished, and in no distress.  Pleasant elderly woman in wheelchair accompanied by her husband.   Eyes: No scleral icterus.  Cardiovascular: Normal rate, regular rhythm and normal heart sounds.   No murmur heard. Pulmonary/Chest: Effort normal and breath sounds normal. No respiratory distress.  Abdominal: Soft. Bowel sounds are normal. She exhibits no  distension. There is no tenderness.  Musculoskeletal: She exhibits no edema or tenderness.       Right knee: She exhibits bony tenderness. She exhibits normal range of motion, no swelling and no erythema.       Left knee: Normal.       Legs: RUA AVG +buit   Neurological: She is alert and oriented to person, place, and time.  Skin: Skin is warm and dry.  Psychiatric: Affect normal.      ASSESSMENT & PLAN:  Problem List Items Addressed This Visit      Genitourinary   ESRD on dialysis ([place])     Other   Wound healing, delayed with exposed bone (Chronic)    MRI reviewed and no acute or chronic  changes to indicate osteomyelitis. CRP elevated with normal sed rate - nonspecific inflammation in setting of multiple conditions. No need for aggressive treatment with IV induction. Would choose doxycycline for her for chronic suppression. Good option to fit with HD and other medications. Would normally consider rifampin as she has extensive hardware present in knee however risk for significant drug interactions and intolerability with age. Rx sent in and will have her follow up in 3 months. Advised to call if unable to tolerate or any changes with status of right knee that would indicate infection.        Other Visit Diagnoses    Chronic antibiotic suppression    -  Primary   Relevant Medications   doxycycline (VIBRA-TABS) 100 MG tablet      Janene Madeira, MSN, [facility]  12/05/2016  3:54 PM

## 2016-12-05 NOTE — Patient Instructions (Addendum)
We are going to start you on an antibiotic called Doxycycline.   You will take this medication twice a day with food. Main side effect is nausea.   Please call the clinic if you have any increased pain, swelling, warmth or drainage at your knee or if you experience any fevers or chills.   We will see you back in 3 months.

## 2016-12-05 NOTE — Assessment & Plan Note (Signed)
MRI reviewed and no acute or chronic changes to indicate osteomyelitis. CRP elevated with normal sed rate - nonspecific inflammation in setting of multiple conditions. No need for aggressive treatment with IV induction. Would choose doxycycline for her for chronic suppression. Good option to fit with HD and other medications. Would normally consider rifampin as she has extensive hardware present in knee however risk for significant drug interactions and intolerability with age. Rx sent in and will have her follow up in 3 months. Advised to call if unable to tolerate or any changes with status of right knee that would indicate infection.

## 2016-12-06 DIAGNOSIS — N2581 Secondary hyperparathyroidism of renal origin: Secondary | ICD-10-CM | POA: Diagnosis not present

## 2016-12-06 DIAGNOSIS — D631 Anemia in chronic kidney disease: Secondary | ICD-10-CM | POA: Diagnosis not present

## 2016-12-06 DIAGNOSIS — N186 End stage renal disease: Secondary | ICD-10-CM | POA: Diagnosis not present

## 2016-12-06 DIAGNOSIS — Z23 Encounter for immunization: Secondary | ICD-10-CM | POA: Diagnosis not present

## 2016-12-06 DIAGNOSIS — D509 Iron deficiency anemia, unspecified: Secondary | ICD-10-CM | POA: Diagnosis not present

## 2016-12-06 DIAGNOSIS — E1122 Type 2 diabetes mellitus with diabetic chronic kidney disease: Secondary | ICD-10-CM | POA: Diagnosis not present

## 2016-12-09 DIAGNOSIS — N186 End stage renal disease: Secondary | ICD-10-CM | POA: Diagnosis not present

## 2016-12-09 DIAGNOSIS — E1122 Type 2 diabetes mellitus with diabetic chronic kidney disease: Secondary | ICD-10-CM | POA: Diagnosis not present

## 2016-12-09 DIAGNOSIS — N2581 Secondary hyperparathyroidism of renal origin: Secondary | ICD-10-CM | POA: Diagnosis not present

## 2016-12-09 DIAGNOSIS — D631 Anemia in chronic kidney disease: Secondary | ICD-10-CM | POA: Diagnosis not present

## 2016-12-09 DIAGNOSIS — D509 Iron deficiency anemia, unspecified: Secondary | ICD-10-CM | POA: Diagnosis not present

## 2016-12-09 DIAGNOSIS — Z23 Encounter for immunization: Secondary | ICD-10-CM | POA: Diagnosis not present

## 2016-12-11 DIAGNOSIS — N2581 Secondary hyperparathyroidism of renal origin: Secondary | ICD-10-CM | POA: Diagnosis not present

## 2016-12-11 DIAGNOSIS — Z23 Encounter for immunization: Secondary | ICD-10-CM | POA: Diagnosis not present

## 2016-12-11 DIAGNOSIS — D631 Anemia in chronic kidney disease: Secondary | ICD-10-CM | POA: Diagnosis not present

## 2016-12-11 DIAGNOSIS — N186 End stage renal disease: Secondary | ICD-10-CM | POA: Diagnosis not present

## 2016-12-11 DIAGNOSIS — D509 Iron deficiency anemia, unspecified: Secondary | ICD-10-CM | POA: Diagnosis not present

## 2016-12-11 DIAGNOSIS — E1122 Type 2 diabetes mellitus with diabetic chronic kidney disease: Secondary | ICD-10-CM | POA: Diagnosis not present

## 2016-12-13 DIAGNOSIS — Z23 Encounter for immunization: Secondary | ICD-10-CM | POA: Diagnosis not present

## 2016-12-13 DIAGNOSIS — E1122 Type 2 diabetes mellitus with diabetic chronic kidney disease: Secondary | ICD-10-CM | POA: Diagnosis not present

## 2016-12-13 DIAGNOSIS — D509 Iron deficiency anemia, unspecified: Secondary | ICD-10-CM | POA: Diagnosis not present

## 2016-12-13 DIAGNOSIS — D631 Anemia in chronic kidney disease: Secondary | ICD-10-CM | POA: Diagnosis not present

## 2016-12-13 DIAGNOSIS — N2581 Secondary hyperparathyroidism of renal origin: Secondary | ICD-10-CM | POA: Diagnosis not present

## 2016-12-13 DIAGNOSIS — N186 End stage renal disease: Secondary | ICD-10-CM | POA: Diagnosis not present

## 2016-12-15 DIAGNOSIS — Z992 Dependence on renal dialysis: Secondary | ICD-10-CM | POA: Diagnosis not present

## 2016-12-15 DIAGNOSIS — E1129 Type 2 diabetes mellitus with other diabetic kidney complication: Secondary | ICD-10-CM | POA: Diagnosis not present

## 2016-12-15 DIAGNOSIS — N186 End stage renal disease: Secondary | ICD-10-CM | POA: Diagnosis not present

## 2016-12-16 DIAGNOSIS — D631 Anemia in chronic kidney disease: Secondary | ICD-10-CM | POA: Diagnosis not present

## 2016-12-16 DIAGNOSIS — E1122 Type 2 diabetes mellitus with diabetic chronic kidney disease: Secondary | ICD-10-CM | POA: Diagnosis not present

## 2016-12-16 DIAGNOSIS — N186 End stage renal disease: Secondary | ICD-10-CM | POA: Diagnosis not present

## 2016-12-16 DIAGNOSIS — N2581 Secondary hyperparathyroidism of renal origin: Secondary | ICD-10-CM | POA: Diagnosis not present

## 2016-12-16 DIAGNOSIS — D509 Iron deficiency anemia, unspecified: Secondary | ICD-10-CM | POA: Diagnosis not present

## 2016-12-17 ENCOUNTER — Encounter: Payer: Self-pay | Admitting: Internal Medicine

## 2016-12-18 ENCOUNTER — Other Ambulatory Visit: Payer: Self-pay | Admitting: Emergency Medicine

## 2016-12-18 DIAGNOSIS — N2581 Secondary hyperparathyroidism of renal origin: Secondary | ICD-10-CM | POA: Diagnosis not present

## 2016-12-18 DIAGNOSIS — N186 End stage renal disease: Secondary | ICD-10-CM | POA: Diagnosis not present

## 2016-12-18 DIAGNOSIS — E1122 Type 2 diabetes mellitus with diabetic chronic kidney disease: Secondary | ICD-10-CM | POA: Diagnosis not present

## 2016-12-18 DIAGNOSIS — D631 Anemia in chronic kidney disease: Secondary | ICD-10-CM | POA: Diagnosis not present

## 2016-12-18 DIAGNOSIS — D509 Iron deficiency anemia, unspecified: Secondary | ICD-10-CM | POA: Diagnosis not present

## 2016-12-19 ENCOUNTER — Other Ambulatory Visit: Payer: Self-pay | Admitting: Internal Medicine

## 2016-12-19 ENCOUNTER — Encounter: Payer: Self-pay | Admitting: Internal Medicine

## 2016-12-19 DIAGNOSIS — Z792 Long term (current) use of antibiotics: Secondary | ICD-10-CM

## 2016-12-20 DIAGNOSIS — D631 Anemia in chronic kidney disease: Secondary | ICD-10-CM | POA: Diagnosis not present

## 2016-12-20 DIAGNOSIS — E1122 Type 2 diabetes mellitus with diabetic chronic kidney disease: Secondary | ICD-10-CM | POA: Diagnosis not present

## 2016-12-20 DIAGNOSIS — D509 Iron deficiency anemia, unspecified: Secondary | ICD-10-CM | POA: Diagnosis not present

## 2016-12-20 DIAGNOSIS — N186 End stage renal disease: Secondary | ICD-10-CM | POA: Diagnosis not present

## 2016-12-20 DIAGNOSIS — N2581 Secondary hyperparathyroidism of renal origin: Secondary | ICD-10-CM | POA: Diagnosis not present

## 2016-12-20 NOTE — Telephone Encounter (Signed)
This was prescribed by ID I believe - they should prescribe it

## 2016-12-23 DIAGNOSIS — D509 Iron deficiency anemia, unspecified: Secondary | ICD-10-CM | POA: Diagnosis not present

## 2016-12-23 DIAGNOSIS — D631 Anemia in chronic kidney disease: Secondary | ICD-10-CM | POA: Diagnosis not present

## 2016-12-23 DIAGNOSIS — E1122 Type 2 diabetes mellitus with diabetic chronic kidney disease: Secondary | ICD-10-CM | POA: Diagnosis not present

## 2016-12-23 DIAGNOSIS — N186 End stage renal disease: Secondary | ICD-10-CM | POA: Diagnosis not present

## 2016-12-23 DIAGNOSIS — N2581 Secondary hyperparathyroidism of renal origin: Secondary | ICD-10-CM | POA: Diagnosis not present

## 2016-12-25 DIAGNOSIS — E1122 Type 2 diabetes mellitus with diabetic chronic kidney disease: Secondary | ICD-10-CM | POA: Diagnosis not present

## 2016-12-25 DIAGNOSIS — N186 End stage renal disease: Secondary | ICD-10-CM | POA: Diagnosis not present

## 2016-12-25 DIAGNOSIS — D509 Iron deficiency anemia, unspecified: Secondary | ICD-10-CM | POA: Diagnosis not present

## 2016-12-25 DIAGNOSIS — D631 Anemia in chronic kidney disease: Secondary | ICD-10-CM | POA: Diagnosis not present

## 2016-12-25 DIAGNOSIS — N2581 Secondary hyperparathyroidism of renal origin: Secondary | ICD-10-CM | POA: Diagnosis not present

## 2016-12-27 DIAGNOSIS — N2581 Secondary hyperparathyroidism of renal origin: Secondary | ICD-10-CM | POA: Diagnosis not present

## 2016-12-27 DIAGNOSIS — D631 Anemia in chronic kidney disease: Secondary | ICD-10-CM | POA: Diagnosis not present

## 2016-12-27 DIAGNOSIS — E1122 Type 2 diabetes mellitus with diabetic chronic kidney disease: Secondary | ICD-10-CM | POA: Diagnosis not present

## 2016-12-27 DIAGNOSIS — N186 End stage renal disease: Secondary | ICD-10-CM | POA: Diagnosis not present

## 2016-12-27 DIAGNOSIS — D509 Iron deficiency anemia, unspecified: Secondary | ICD-10-CM | POA: Diagnosis not present

## 2016-12-30 DIAGNOSIS — D509 Iron deficiency anemia, unspecified: Secondary | ICD-10-CM | POA: Diagnosis not present

## 2016-12-30 DIAGNOSIS — N186 End stage renal disease: Secondary | ICD-10-CM | POA: Diagnosis not present

## 2016-12-30 DIAGNOSIS — E1122 Type 2 diabetes mellitus with diabetic chronic kidney disease: Secondary | ICD-10-CM | POA: Diagnosis not present

## 2016-12-30 DIAGNOSIS — D631 Anemia in chronic kidney disease: Secondary | ICD-10-CM | POA: Diagnosis not present

## 2016-12-30 DIAGNOSIS — N2581 Secondary hyperparathyroidism of renal origin: Secondary | ICD-10-CM | POA: Diagnosis not present

## 2017-01-01 DIAGNOSIS — D509 Iron deficiency anemia, unspecified: Secondary | ICD-10-CM | POA: Diagnosis not present

## 2017-01-01 DIAGNOSIS — N186 End stage renal disease: Secondary | ICD-10-CM | POA: Diagnosis not present

## 2017-01-01 DIAGNOSIS — D631 Anemia in chronic kidney disease: Secondary | ICD-10-CM | POA: Diagnosis not present

## 2017-01-01 DIAGNOSIS — N2581 Secondary hyperparathyroidism of renal origin: Secondary | ICD-10-CM | POA: Diagnosis not present

## 2017-01-01 DIAGNOSIS — E1122 Type 2 diabetes mellitus with diabetic chronic kidney disease: Secondary | ICD-10-CM | POA: Diagnosis not present

## 2017-01-03 DIAGNOSIS — N2581 Secondary hyperparathyroidism of renal origin: Secondary | ICD-10-CM | POA: Diagnosis not present

## 2017-01-03 DIAGNOSIS — E1122 Type 2 diabetes mellitus with diabetic chronic kidney disease: Secondary | ICD-10-CM | POA: Diagnosis not present

## 2017-01-03 DIAGNOSIS — D631 Anemia in chronic kidney disease: Secondary | ICD-10-CM | POA: Diagnosis not present

## 2017-01-03 DIAGNOSIS — N186 End stage renal disease: Secondary | ICD-10-CM | POA: Diagnosis not present

## 2017-01-03 DIAGNOSIS — D509 Iron deficiency anemia, unspecified: Secondary | ICD-10-CM | POA: Diagnosis not present

## 2017-01-06 ENCOUNTER — Other Ambulatory Visit: Payer: Self-pay | Admitting: Internal Medicine

## 2017-01-06 DIAGNOSIS — N2581 Secondary hyperparathyroidism of renal origin: Secondary | ICD-10-CM | POA: Diagnosis not present

## 2017-01-06 DIAGNOSIS — E1122 Type 2 diabetes mellitus with diabetic chronic kidney disease: Secondary | ICD-10-CM | POA: Diagnosis not present

## 2017-01-06 DIAGNOSIS — D509 Iron deficiency anemia, unspecified: Secondary | ICD-10-CM | POA: Diagnosis not present

## 2017-01-06 DIAGNOSIS — D631 Anemia in chronic kidney disease: Secondary | ICD-10-CM | POA: Diagnosis not present

## 2017-01-06 DIAGNOSIS — N186 End stage renal disease: Secondary | ICD-10-CM | POA: Diagnosis not present

## 2017-01-08 DIAGNOSIS — N186 End stage renal disease: Secondary | ICD-10-CM | POA: Diagnosis not present

## 2017-01-08 DIAGNOSIS — D509 Iron deficiency anemia, unspecified: Secondary | ICD-10-CM | POA: Diagnosis not present

## 2017-01-08 DIAGNOSIS — E1122 Type 2 diabetes mellitus with diabetic chronic kidney disease: Secondary | ICD-10-CM | POA: Diagnosis not present

## 2017-01-08 DIAGNOSIS — N2581 Secondary hyperparathyroidism of renal origin: Secondary | ICD-10-CM | POA: Diagnosis not present

## 2017-01-08 DIAGNOSIS — D631 Anemia in chronic kidney disease: Secondary | ICD-10-CM | POA: Diagnosis not present

## 2017-01-10 DIAGNOSIS — E1122 Type 2 diabetes mellitus with diabetic chronic kidney disease: Secondary | ICD-10-CM | POA: Diagnosis not present

## 2017-01-10 DIAGNOSIS — D631 Anemia in chronic kidney disease: Secondary | ICD-10-CM | POA: Diagnosis not present

## 2017-01-10 DIAGNOSIS — N2581 Secondary hyperparathyroidism of renal origin: Secondary | ICD-10-CM | POA: Diagnosis not present

## 2017-01-10 DIAGNOSIS — N186 End stage renal disease: Secondary | ICD-10-CM | POA: Diagnosis not present

## 2017-01-10 DIAGNOSIS — D509 Iron deficiency anemia, unspecified: Secondary | ICD-10-CM | POA: Diagnosis not present

## 2017-01-13 DIAGNOSIS — E1122 Type 2 diabetes mellitus with diabetic chronic kidney disease: Secondary | ICD-10-CM | POA: Diagnosis not present

## 2017-01-13 DIAGNOSIS — N186 End stage renal disease: Secondary | ICD-10-CM | POA: Diagnosis not present

## 2017-01-13 DIAGNOSIS — N2581 Secondary hyperparathyroidism of renal origin: Secondary | ICD-10-CM | POA: Diagnosis not present

## 2017-01-13 DIAGNOSIS — D631 Anemia in chronic kidney disease: Secondary | ICD-10-CM | POA: Diagnosis not present

## 2017-01-13 DIAGNOSIS — D509 Iron deficiency anemia, unspecified: Secondary | ICD-10-CM | POA: Diagnosis not present

## 2017-01-15 DIAGNOSIS — E1122 Type 2 diabetes mellitus with diabetic chronic kidney disease: Secondary | ICD-10-CM | POA: Diagnosis not present

## 2017-01-15 DIAGNOSIS — Z992 Dependence on renal dialysis: Secondary | ICD-10-CM | POA: Diagnosis not present

## 2017-01-15 DIAGNOSIS — E1129 Type 2 diabetes mellitus with other diabetic kidney complication: Secondary | ICD-10-CM | POA: Diagnosis not present

## 2017-01-15 DIAGNOSIS — D509 Iron deficiency anemia, unspecified: Secondary | ICD-10-CM | POA: Diagnosis not present

## 2017-01-15 DIAGNOSIS — D631 Anemia in chronic kidney disease: Secondary | ICD-10-CM | POA: Diagnosis not present

## 2017-01-15 DIAGNOSIS — N2581 Secondary hyperparathyroidism of renal origin: Secondary | ICD-10-CM | POA: Diagnosis not present

## 2017-01-15 DIAGNOSIS — N186 End stage renal disease: Secondary | ICD-10-CM | POA: Diagnosis not present

## 2017-01-16 DIAGNOSIS — Z992 Dependence on renal dialysis: Secondary | ICD-10-CM | POA: Diagnosis not present

## 2017-01-16 DIAGNOSIS — N186 End stage renal disease: Secondary | ICD-10-CM | POA: Diagnosis not present

## 2017-01-16 DIAGNOSIS — E1129 Type 2 diabetes mellitus with other diabetic kidney complication: Secondary | ICD-10-CM | POA: Diagnosis not present

## 2017-01-17 DIAGNOSIS — Z23 Encounter for immunization: Secondary | ICD-10-CM | POA: Diagnosis not present

## 2017-01-17 DIAGNOSIS — N2581 Secondary hyperparathyroidism of renal origin: Secondary | ICD-10-CM | POA: Diagnosis not present

## 2017-01-17 DIAGNOSIS — D509 Iron deficiency anemia, unspecified: Secondary | ICD-10-CM | POA: Diagnosis not present

## 2017-01-17 DIAGNOSIS — N186 End stage renal disease: Secondary | ICD-10-CM | POA: Diagnosis not present

## 2017-01-20 DIAGNOSIS — N2581 Secondary hyperparathyroidism of renal origin: Secondary | ICD-10-CM | POA: Diagnosis not present

## 2017-01-20 DIAGNOSIS — Z23 Encounter for immunization: Secondary | ICD-10-CM | POA: Diagnosis not present

## 2017-01-20 DIAGNOSIS — N186 End stage renal disease: Secondary | ICD-10-CM | POA: Diagnosis not present

## 2017-01-20 DIAGNOSIS — D509 Iron deficiency anemia, unspecified: Secondary | ICD-10-CM | POA: Diagnosis not present

## 2017-01-22 DIAGNOSIS — N2581 Secondary hyperparathyroidism of renal origin: Secondary | ICD-10-CM | POA: Diagnosis not present

## 2017-01-22 DIAGNOSIS — N186 End stage renal disease: Secondary | ICD-10-CM | POA: Diagnosis not present

## 2017-01-22 DIAGNOSIS — Z23 Encounter for immunization: Secondary | ICD-10-CM | POA: Diagnosis not present

## 2017-01-22 DIAGNOSIS — D509 Iron deficiency anemia, unspecified: Secondary | ICD-10-CM | POA: Diagnosis not present

## 2017-01-24 DIAGNOSIS — N2581 Secondary hyperparathyroidism of renal origin: Secondary | ICD-10-CM | POA: Diagnosis not present

## 2017-01-24 DIAGNOSIS — N186 End stage renal disease: Secondary | ICD-10-CM | POA: Diagnosis not present

## 2017-01-24 DIAGNOSIS — D509 Iron deficiency anemia, unspecified: Secondary | ICD-10-CM | POA: Diagnosis not present

## 2017-01-24 DIAGNOSIS — Z23 Encounter for immunization: Secondary | ICD-10-CM | POA: Diagnosis not present

## 2017-01-27 DIAGNOSIS — N186 End stage renal disease: Secondary | ICD-10-CM | POA: Diagnosis not present

## 2017-01-27 DIAGNOSIS — D509 Iron deficiency anemia, unspecified: Secondary | ICD-10-CM | POA: Diagnosis not present

## 2017-01-27 DIAGNOSIS — N2581 Secondary hyperparathyroidism of renal origin: Secondary | ICD-10-CM | POA: Diagnosis not present

## 2017-01-27 DIAGNOSIS — Z23 Encounter for immunization: Secondary | ICD-10-CM | POA: Diagnosis not present

## 2017-01-29 DIAGNOSIS — Z23 Encounter for immunization: Secondary | ICD-10-CM | POA: Diagnosis not present

## 2017-01-29 DIAGNOSIS — N2581 Secondary hyperparathyroidism of renal origin: Secondary | ICD-10-CM | POA: Diagnosis not present

## 2017-01-29 DIAGNOSIS — N186 End stage renal disease: Secondary | ICD-10-CM | POA: Diagnosis not present

## 2017-01-29 DIAGNOSIS — D509 Iron deficiency anemia, unspecified: Secondary | ICD-10-CM | POA: Diagnosis not present

## 2017-01-31 DIAGNOSIS — Z23 Encounter for immunization: Secondary | ICD-10-CM | POA: Diagnosis not present

## 2017-01-31 DIAGNOSIS — N186 End stage renal disease: Secondary | ICD-10-CM | POA: Diagnosis not present

## 2017-01-31 DIAGNOSIS — D509 Iron deficiency anemia, unspecified: Secondary | ICD-10-CM | POA: Diagnosis not present

## 2017-01-31 DIAGNOSIS — N2581 Secondary hyperparathyroidism of renal origin: Secondary | ICD-10-CM | POA: Diagnosis not present

## 2017-02-03 DIAGNOSIS — N186 End stage renal disease: Secondary | ICD-10-CM | POA: Diagnosis not present

## 2017-02-03 DIAGNOSIS — N2581 Secondary hyperparathyroidism of renal origin: Secondary | ICD-10-CM | POA: Diagnosis not present

## 2017-02-03 DIAGNOSIS — D631 Anemia in chronic kidney disease: Secondary | ICD-10-CM | POA: Diagnosis not present

## 2017-02-05 DIAGNOSIS — N2581 Secondary hyperparathyroidism of renal origin: Secondary | ICD-10-CM | POA: Diagnosis not present

## 2017-02-05 DIAGNOSIS — N186 End stage renal disease: Secondary | ICD-10-CM | POA: Diagnosis not present

## 2017-02-05 DIAGNOSIS — D631 Anemia in chronic kidney disease: Secondary | ICD-10-CM | POA: Diagnosis not present

## 2017-02-07 DIAGNOSIS — N186 End stage renal disease: Secondary | ICD-10-CM | POA: Diagnosis not present

## 2017-02-07 DIAGNOSIS — N2581 Secondary hyperparathyroidism of renal origin: Secondary | ICD-10-CM | POA: Diagnosis not present

## 2017-02-07 DIAGNOSIS — D631 Anemia in chronic kidney disease: Secondary | ICD-10-CM | POA: Diagnosis not present

## 2017-02-10 DIAGNOSIS — D631 Anemia in chronic kidney disease: Secondary | ICD-10-CM | POA: Diagnosis not present

## 2017-02-10 DIAGNOSIS — Z992 Dependence on renal dialysis: Secondary | ICD-10-CM | POA: Diagnosis not present

## 2017-02-10 DIAGNOSIS — I120 Hypertensive chronic kidney disease with stage 5 chronic kidney disease or end stage renal disease: Secondary | ICD-10-CM | POA: Diagnosis not present

## 2017-02-10 DIAGNOSIS — N2581 Secondary hyperparathyroidism of renal origin: Secondary | ICD-10-CM | POA: Diagnosis not present

## 2017-02-10 DIAGNOSIS — N186 End stage renal disease: Secondary | ICD-10-CM | POA: Diagnosis not present

## 2017-02-12 DIAGNOSIS — N186 End stage renal disease: Secondary | ICD-10-CM | POA: Diagnosis not present

## 2017-02-12 DIAGNOSIS — N2581 Secondary hyperparathyroidism of renal origin: Secondary | ICD-10-CM | POA: Diagnosis not present

## 2017-02-12 DIAGNOSIS — D631 Anemia in chronic kidney disease: Secondary | ICD-10-CM | POA: Diagnosis not present

## 2017-02-14 DIAGNOSIS — N186 End stage renal disease: Secondary | ICD-10-CM | POA: Diagnosis not present

## 2017-02-14 DIAGNOSIS — D631 Anemia in chronic kidney disease: Secondary | ICD-10-CM | POA: Diagnosis not present

## 2017-02-14 DIAGNOSIS — N2581 Secondary hyperparathyroidism of renal origin: Secondary | ICD-10-CM | POA: Diagnosis not present

## 2017-02-17 DIAGNOSIS — I120 Hypertensive chronic kidney disease with stage 5 chronic kidney disease or end stage renal disease: Secondary | ICD-10-CM | POA: Diagnosis not present

## 2017-02-17 DIAGNOSIS — N2581 Secondary hyperparathyroidism of renal origin: Secondary | ICD-10-CM | POA: Diagnosis not present

## 2017-02-17 DIAGNOSIS — N186 End stage renal disease: Secondary | ICD-10-CM | POA: Diagnosis not present

## 2017-02-17 DIAGNOSIS — D631 Anemia in chronic kidney disease: Secondary | ICD-10-CM | POA: Diagnosis not present

## 2017-02-17 DIAGNOSIS — Z992 Dependence on renal dialysis: Secondary | ICD-10-CM | POA: Diagnosis not present

## 2017-02-19 DIAGNOSIS — D631 Anemia in chronic kidney disease: Secondary | ICD-10-CM | POA: Diagnosis not present

## 2017-02-19 DIAGNOSIS — N186 End stage renal disease: Secondary | ICD-10-CM | POA: Diagnosis not present

## 2017-02-19 DIAGNOSIS — N2581 Secondary hyperparathyroidism of renal origin: Secondary | ICD-10-CM | POA: Diagnosis not present

## 2017-02-21 DIAGNOSIS — D631 Anemia in chronic kidney disease: Secondary | ICD-10-CM | POA: Diagnosis not present

## 2017-02-21 DIAGNOSIS — N2581 Secondary hyperparathyroidism of renal origin: Secondary | ICD-10-CM | POA: Diagnosis not present

## 2017-02-21 DIAGNOSIS — N186 End stage renal disease: Secondary | ICD-10-CM | POA: Diagnosis not present

## 2017-02-24 DIAGNOSIS — N2581 Secondary hyperparathyroidism of renal origin: Secondary | ICD-10-CM | POA: Diagnosis not present

## 2017-02-24 DIAGNOSIS — N186 End stage renal disease: Secondary | ICD-10-CM | POA: Diagnosis not present

## 2017-02-25 DIAGNOSIS — N186 End stage renal disease: Secondary | ICD-10-CM | POA: Diagnosis not present

## 2017-02-25 DIAGNOSIS — E1129 Type 2 diabetes mellitus with other diabetic kidney complication: Secondary | ICD-10-CM | POA: Diagnosis not present

## 2017-02-25 DIAGNOSIS — Z992 Dependence on renal dialysis: Secondary | ICD-10-CM | POA: Diagnosis not present

## 2017-02-26 DIAGNOSIS — N2581 Secondary hyperparathyroidism of renal origin: Secondary | ICD-10-CM | POA: Diagnosis not present

## 2017-02-26 DIAGNOSIS — N186 End stage renal disease: Secondary | ICD-10-CM | POA: Diagnosis not present

## 2017-02-28 DIAGNOSIS — N2581 Secondary hyperparathyroidism of renal origin: Secondary | ICD-10-CM | POA: Diagnosis not present

## 2017-02-28 DIAGNOSIS — N186 End stage renal disease: Secondary | ICD-10-CM | POA: Diagnosis not present

## 2017-03-03 DIAGNOSIS — D631 Anemia in chronic kidney disease: Secondary | ICD-10-CM | POA: Diagnosis not present

## 2017-03-03 DIAGNOSIS — N2581 Secondary hyperparathyroidism of renal origin: Secondary | ICD-10-CM | POA: Diagnosis not present

## 2017-03-03 DIAGNOSIS — N186 End stage renal disease: Secondary | ICD-10-CM | POA: Diagnosis not present

## 2017-03-05 DIAGNOSIS — N186 End stage renal disease: Secondary | ICD-10-CM | POA: Diagnosis not present

## 2017-03-05 DIAGNOSIS — D631 Anemia in chronic kidney disease: Secondary | ICD-10-CM | POA: Diagnosis not present

## 2017-03-05 DIAGNOSIS — N2581 Secondary hyperparathyroidism of renal origin: Secondary | ICD-10-CM | POA: Diagnosis not present

## 2017-03-06 ENCOUNTER — Ambulatory Visit: Payer: Medicare Other | Admitting: Infectious Diseases

## 2017-03-07 DIAGNOSIS — N2581 Secondary hyperparathyroidism of renal origin: Secondary | ICD-10-CM | POA: Diagnosis not present

## 2017-03-07 DIAGNOSIS — D631 Anemia in chronic kidney disease: Secondary | ICD-10-CM | POA: Diagnosis not present

## 2017-03-07 DIAGNOSIS — N186 End stage renal disease: Secondary | ICD-10-CM | POA: Diagnosis not present

## 2017-03-09 DIAGNOSIS — D631 Anemia in chronic kidney disease: Secondary | ICD-10-CM | POA: Diagnosis not present

## 2017-03-09 DIAGNOSIS — N186 End stage renal disease: Secondary | ICD-10-CM | POA: Diagnosis not present

## 2017-03-09 DIAGNOSIS — N2581 Secondary hyperparathyroidism of renal origin: Secondary | ICD-10-CM | POA: Diagnosis not present

## 2017-03-12 DIAGNOSIS — N2581 Secondary hyperparathyroidism of renal origin: Secondary | ICD-10-CM | POA: Diagnosis not present

## 2017-03-12 DIAGNOSIS — D631 Anemia in chronic kidney disease: Secondary | ICD-10-CM | POA: Diagnosis not present

## 2017-03-12 DIAGNOSIS — N186 End stage renal disease: Secondary | ICD-10-CM | POA: Diagnosis not present

## 2017-03-14 DIAGNOSIS — N2581 Secondary hyperparathyroidism of renal origin: Secondary | ICD-10-CM | POA: Diagnosis not present

## 2017-03-14 DIAGNOSIS — N186 End stage renal disease: Secondary | ICD-10-CM | POA: Diagnosis not present

## 2017-03-14 DIAGNOSIS — D631 Anemia in chronic kidney disease: Secondary | ICD-10-CM | POA: Diagnosis not present

## 2017-03-16 DIAGNOSIS — N2581 Secondary hyperparathyroidism of renal origin: Secondary | ICD-10-CM | POA: Diagnosis not present

## 2017-03-16 DIAGNOSIS — N186 End stage renal disease: Secondary | ICD-10-CM | POA: Diagnosis not present

## 2017-03-16 DIAGNOSIS — D631 Anemia in chronic kidney disease: Secondary | ICD-10-CM | POA: Diagnosis not present

## 2017-03-19 DIAGNOSIS — E876 Hypokalemia: Secondary | ICD-10-CM | POA: Diagnosis not present

## 2017-03-19 DIAGNOSIS — I120 Hypertensive chronic kidney disease with stage 5 chronic kidney disease or end stage renal disease: Secondary | ICD-10-CM | POA: Diagnosis not present

## 2017-03-19 DIAGNOSIS — D631 Anemia in chronic kidney disease: Secondary | ICD-10-CM | POA: Diagnosis not present

## 2017-03-19 DIAGNOSIS — T82898A Other specified complication of vascular prosthetic devices, implants and grafts, initial encounter: Secondary | ICD-10-CM | POA: Diagnosis not present

## 2017-03-19 DIAGNOSIS — T82590A Other mechanical complication of surgically created arteriovenous fistula, initial encounter: Secondary | ICD-10-CM | POA: Diagnosis not present

## 2017-03-19 DIAGNOSIS — Z992 Dependence on renal dialysis: Secondary | ICD-10-CM | POA: Diagnosis not present

## 2017-03-19 DIAGNOSIS — N186 End stage renal disease: Secondary | ICD-10-CM | POA: Diagnosis not present

## 2017-03-19 DIAGNOSIS — Z743 Need for continuous supervision: Secondary | ICD-10-CM | POA: Diagnosis not present

## 2017-03-19 DIAGNOSIS — N2581 Secondary hyperparathyroidism of renal origin: Secondary | ICD-10-CM | POA: Diagnosis not present

## 2017-03-19 DIAGNOSIS — T82838A Hemorrhage of vascular prosthetic devices, implants and grafts, initial encounter: Secondary | ICD-10-CM | POA: Diagnosis not present

## 2017-03-20 DIAGNOSIS — T82590A Other mechanical complication of surgically created arteriovenous fistula, initial encounter: Secondary | ICD-10-CM | POA: Diagnosis not present

## 2017-03-20 DIAGNOSIS — T82838A Hemorrhage of vascular prosthetic devices, implants and grafts, initial encounter: Secondary | ICD-10-CM | POA: Diagnosis not present

## 2017-03-20 DIAGNOSIS — I120 Hypertensive chronic kidney disease with stage 5 chronic kidney disease or end stage renal disease: Secondary | ICD-10-CM | POA: Diagnosis not present

## 2017-03-20 DIAGNOSIS — I770 Arteriovenous fistula, acquired: Secondary | ICD-10-CM | POA: Diagnosis present

## 2017-03-20 DIAGNOSIS — Z452 Encounter for adjustment and management of vascular access device: Secondary | ICD-10-CM | POA: Diagnosis not present

## 2017-03-20 DIAGNOSIS — T82898A Other specified complication of vascular prosthetic devices, implants and grafts, initial encounter: Secondary | ICD-10-CM | POA: Diagnosis present

## 2017-03-20 DIAGNOSIS — E1122 Type 2 diabetes mellitus with diabetic chronic kidney disease: Secondary | ICD-10-CM | POA: Diagnosis present

## 2017-03-20 DIAGNOSIS — Z4901 Encounter for fitting and adjustment of extracorporeal dialysis catheter: Secondary | ICD-10-CM | POA: Diagnosis not present

## 2017-03-20 DIAGNOSIS — D649 Anemia, unspecified: Secondary | ICD-10-CM | POA: Diagnosis not present

## 2017-03-20 DIAGNOSIS — D62 Acute posthemorrhagic anemia: Secondary | ICD-10-CM | POA: Diagnosis present

## 2017-03-20 DIAGNOSIS — Z992 Dependence on renal dialysis: Secondary | ICD-10-CM | POA: Diagnosis not present

## 2017-03-20 DIAGNOSIS — N186 End stage renal disease: Secondary | ICD-10-CM | POA: Diagnosis not present

## 2017-03-20 DIAGNOSIS — Z7982 Long term (current) use of aspirin: Secondary | ICD-10-CM | POA: Diagnosis not present

## 2017-03-20 DIAGNOSIS — I10 Essential (primary) hypertension: Secondary | ICD-10-CM | POA: Diagnosis not present

## 2017-03-20 DIAGNOSIS — Z888 Allergy status to other drugs, medicaments and biological substances status: Secondary | ICD-10-CM | POA: Diagnosis not present

## 2017-03-20 DIAGNOSIS — T8241XA Breakdown (mechanical) of vascular dialysis catheter, initial encounter: Secondary | ICD-10-CM | POA: Diagnosis not present

## 2017-03-24 DIAGNOSIS — N186 End stage renal disease: Secondary | ICD-10-CM | POA: Diagnosis not present

## 2017-03-24 DIAGNOSIS — E876 Hypokalemia: Secondary | ICD-10-CM | POA: Diagnosis not present

## 2017-03-24 DIAGNOSIS — N2581 Secondary hyperparathyroidism of renal origin: Secondary | ICD-10-CM | POA: Diagnosis not present

## 2017-03-24 DIAGNOSIS — D631 Anemia in chronic kidney disease: Secondary | ICD-10-CM | POA: Diagnosis not present

## 2017-03-26 DIAGNOSIS — N186 End stage renal disease: Secondary | ICD-10-CM | POA: Diagnosis not present

## 2017-03-26 DIAGNOSIS — E876 Hypokalemia: Secondary | ICD-10-CM | POA: Diagnosis not present

## 2017-03-26 DIAGNOSIS — D631 Anemia in chronic kidney disease: Secondary | ICD-10-CM | POA: Diagnosis not present

## 2017-03-26 DIAGNOSIS — N2581 Secondary hyperparathyroidism of renal origin: Secondary | ICD-10-CM | POA: Diagnosis not present

## 2017-03-28 DIAGNOSIS — E876 Hypokalemia: Secondary | ICD-10-CM | POA: Diagnosis not present

## 2017-03-28 DIAGNOSIS — N2581 Secondary hyperparathyroidism of renal origin: Secondary | ICD-10-CM | POA: Diagnosis not present

## 2017-03-28 DIAGNOSIS — D631 Anemia in chronic kidney disease: Secondary | ICD-10-CM | POA: Diagnosis not present

## 2017-03-28 DIAGNOSIS — N186 End stage renal disease: Secondary | ICD-10-CM | POA: Diagnosis not present

## 2017-03-31 DIAGNOSIS — N186 End stage renal disease: Secondary | ICD-10-CM | POA: Diagnosis not present

## 2017-03-31 DIAGNOSIS — E876 Hypokalemia: Secondary | ICD-10-CM | POA: Diagnosis not present

## 2017-03-31 DIAGNOSIS — N2581 Secondary hyperparathyroidism of renal origin: Secondary | ICD-10-CM | POA: Diagnosis not present

## 2017-03-31 DIAGNOSIS — D631 Anemia in chronic kidney disease: Secondary | ICD-10-CM | POA: Diagnosis not present

## 2017-04-02 DIAGNOSIS — D631 Anemia in chronic kidney disease: Secondary | ICD-10-CM | POA: Diagnosis not present

## 2017-04-02 DIAGNOSIS — N186 End stage renal disease: Secondary | ICD-10-CM | POA: Diagnosis not present

## 2017-04-02 DIAGNOSIS — N2581 Secondary hyperparathyroidism of renal origin: Secondary | ICD-10-CM | POA: Diagnosis not present

## 2017-04-02 DIAGNOSIS — E876 Hypokalemia: Secondary | ICD-10-CM | POA: Diagnosis not present

## 2017-04-04 DIAGNOSIS — N186 End stage renal disease: Secondary | ICD-10-CM | POA: Diagnosis not present

## 2017-04-04 DIAGNOSIS — N2581 Secondary hyperparathyroidism of renal origin: Secondary | ICD-10-CM | POA: Diagnosis not present

## 2017-04-04 DIAGNOSIS — E876 Hypokalemia: Secondary | ICD-10-CM | POA: Diagnosis not present

## 2017-04-04 DIAGNOSIS — D631 Anemia in chronic kidney disease: Secondary | ICD-10-CM | POA: Diagnosis not present

## 2017-04-07 DIAGNOSIS — N186 End stage renal disease: Secondary | ICD-10-CM | POA: Diagnosis not present

## 2017-04-07 DIAGNOSIS — E876 Hypokalemia: Secondary | ICD-10-CM | POA: Diagnosis not present

## 2017-04-07 DIAGNOSIS — N2581 Secondary hyperparathyroidism of renal origin: Secondary | ICD-10-CM | POA: Diagnosis not present

## 2017-04-07 DIAGNOSIS — D631 Anemia in chronic kidney disease: Secondary | ICD-10-CM | POA: Diagnosis not present

## 2017-04-09 DIAGNOSIS — D631 Anemia in chronic kidney disease: Secondary | ICD-10-CM | POA: Diagnosis not present

## 2017-04-09 DIAGNOSIS — N2581 Secondary hyperparathyroidism of renal origin: Secondary | ICD-10-CM | POA: Diagnosis not present

## 2017-04-09 DIAGNOSIS — N186 End stage renal disease: Secondary | ICD-10-CM | POA: Diagnosis not present

## 2017-04-09 DIAGNOSIS — E876 Hypokalemia: Secondary | ICD-10-CM | POA: Diagnosis not present

## 2017-04-11 DIAGNOSIS — D631 Anemia in chronic kidney disease: Secondary | ICD-10-CM | POA: Diagnosis not present

## 2017-04-11 DIAGNOSIS — E876 Hypokalemia: Secondary | ICD-10-CM | POA: Diagnosis not present

## 2017-04-11 DIAGNOSIS — N2581 Secondary hyperparathyroidism of renal origin: Secondary | ICD-10-CM | POA: Diagnosis not present

## 2017-04-11 DIAGNOSIS — N186 End stage renal disease: Secondary | ICD-10-CM | POA: Diagnosis not present

## 2017-04-15 DIAGNOSIS — D631 Anemia in chronic kidney disease: Secondary | ICD-10-CM | POA: Diagnosis not present

## 2017-04-15 DIAGNOSIS — N2581 Secondary hyperparathyroidism of renal origin: Secondary | ICD-10-CM | POA: Diagnosis not present

## 2017-04-15 DIAGNOSIS — E876 Hypokalemia: Secondary | ICD-10-CM | POA: Diagnosis not present

## 2017-04-15 DIAGNOSIS — N186 End stage renal disease: Secondary | ICD-10-CM | POA: Diagnosis not present

## 2017-04-16 DIAGNOSIS — N2581 Secondary hyperparathyroidism of renal origin: Secondary | ICD-10-CM | POA: Diagnosis not present

## 2017-04-16 DIAGNOSIS — N186 End stage renal disease: Secondary | ICD-10-CM | POA: Diagnosis not present

## 2017-04-16 DIAGNOSIS — E876 Hypokalemia: Secondary | ICD-10-CM | POA: Diagnosis not present

## 2017-04-16 DIAGNOSIS — D631 Anemia in chronic kidney disease: Secondary | ICD-10-CM | POA: Diagnosis not present

## 2017-04-19 DIAGNOSIS — N186 End stage renal disease: Secondary | ICD-10-CM | POA: Diagnosis not present

## 2017-04-19 DIAGNOSIS — E876 Hypokalemia: Secondary | ICD-10-CM | POA: Diagnosis not present

## 2017-04-19 DIAGNOSIS — D631 Anemia in chronic kidney disease: Secondary | ICD-10-CM | POA: Diagnosis not present

## 2017-04-19 DIAGNOSIS — N2581 Secondary hyperparathyroidism of renal origin: Secondary | ICD-10-CM | POA: Diagnosis not present

## 2017-04-19 DIAGNOSIS — D509 Iron deficiency anemia, unspecified: Secondary | ICD-10-CM | POA: Diagnosis not present

## 2017-04-21 DIAGNOSIS — N186 End stage renal disease: Secondary | ICD-10-CM | POA: Diagnosis not present

## 2017-04-21 DIAGNOSIS — D631 Anemia in chronic kidney disease: Secondary | ICD-10-CM | POA: Diagnosis not present

## 2017-04-21 DIAGNOSIS — E876 Hypokalemia: Secondary | ICD-10-CM | POA: Diagnosis not present

## 2017-04-21 DIAGNOSIS — D509 Iron deficiency anemia, unspecified: Secondary | ICD-10-CM | POA: Diagnosis not present

## 2017-04-21 DIAGNOSIS — N2581 Secondary hyperparathyroidism of renal origin: Secondary | ICD-10-CM | POA: Diagnosis not present

## 2017-04-22 DIAGNOSIS — N2581 Secondary hyperparathyroidism of renal origin: Secondary | ICD-10-CM | POA: Diagnosis not present

## 2017-04-22 DIAGNOSIS — E876 Hypokalemia: Secondary | ICD-10-CM | POA: Diagnosis not present

## 2017-04-22 DIAGNOSIS — D509 Iron deficiency anemia, unspecified: Secondary | ICD-10-CM | POA: Diagnosis not present

## 2017-04-22 DIAGNOSIS — D631 Anemia in chronic kidney disease: Secondary | ICD-10-CM | POA: Diagnosis not present

## 2017-04-22 DIAGNOSIS — N186 End stage renal disease: Secondary | ICD-10-CM | POA: Diagnosis not present

## 2017-04-23 DIAGNOSIS — D631 Anemia in chronic kidney disease: Secondary | ICD-10-CM | POA: Diagnosis not present

## 2017-04-23 DIAGNOSIS — D509 Iron deficiency anemia, unspecified: Secondary | ICD-10-CM | POA: Diagnosis not present

## 2017-04-23 DIAGNOSIS — Z992 Dependence on renal dialysis: Secondary | ICD-10-CM | POA: Diagnosis not present

## 2017-04-23 DIAGNOSIS — E876 Hypokalemia: Secondary | ICD-10-CM | POA: Diagnosis not present

## 2017-04-23 DIAGNOSIS — I120 Hypertensive chronic kidney disease with stage 5 chronic kidney disease or end stage renal disease: Secondary | ICD-10-CM | POA: Diagnosis not present

## 2017-04-23 DIAGNOSIS — N2581 Secondary hyperparathyroidism of renal origin: Secondary | ICD-10-CM | POA: Diagnosis not present

## 2017-04-23 DIAGNOSIS — N186 End stage renal disease: Secondary | ICD-10-CM | POA: Diagnosis not present

## 2017-04-25 DIAGNOSIS — D509 Iron deficiency anemia, unspecified: Secondary | ICD-10-CM | POA: Diagnosis not present

## 2017-04-25 DIAGNOSIS — E876 Hypokalemia: Secondary | ICD-10-CM | POA: Diagnosis not present

## 2017-04-25 DIAGNOSIS — N186 End stage renal disease: Secondary | ICD-10-CM | POA: Diagnosis not present

## 2017-04-25 DIAGNOSIS — N2581 Secondary hyperparathyroidism of renal origin: Secondary | ICD-10-CM | POA: Diagnosis not present

## 2017-04-25 DIAGNOSIS — D631 Anemia in chronic kidney disease: Secondary | ICD-10-CM | POA: Diagnosis not present

## 2017-04-28 DIAGNOSIS — E876 Hypokalemia: Secondary | ICD-10-CM | POA: Diagnosis not present

## 2017-04-28 DIAGNOSIS — D509 Iron deficiency anemia, unspecified: Secondary | ICD-10-CM | POA: Diagnosis not present

## 2017-04-28 DIAGNOSIS — E1122 Type 2 diabetes mellitus with diabetic chronic kidney disease: Secondary | ICD-10-CM | POA: Diagnosis not present

## 2017-04-28 DIAGNOSIS — N2581 Secondary hyperparathyroidism of renal origin: Secondary | ICD-10-CM | POA: Diagnosis not present

## 2017-04-28 DIAGNOSIS — N186 End stage renal disease: Secondary | ICD-10-CM | POA: Diagnosis not present

## 2017-04-28 DIAGNOSIS — D631 Anemia in chronic kidney disease: Secondary | ICD-10-CM | POA: Diagnosis not present

## 2017-04-30 DIAGNOSIS — D631 Anemia in chronic kidney disease: Secondary | ICD-10-CM | POA: Diagnosis not present

## 2017-04-30 DIAGNOSIS — N2581 Secondary hyperparathyroidism of renal origin: Secondary | ICD-10-CM | POA: Diagnosis not present

## 2017-04-30 DIAGNOSIS — E876 Hypokalemia: Secondary | ICD-10-CM | POA: Diagnosis not present

## 2017-04-30 DIAGNOSIS — D509 Iron deficiency anemia, unspecified: Secondary | ICD-10-CM | POA: Diagnosis not present

## 2017-04-30 DIAGNOSIS — N186 End stage renal disease: Secondary | ICD-10-CM | POA: Diagnosis not present

## 2017-05-02 DIAGNOSIS — N2581 Secondary hyperparathyroidism of renal origin: Secondary | ICD-10-CM | POA: Diagnosis not present

## 2017-05-02 DIAGNOSIS — E876 Hypokalemia: Secondary | ICD-10-CM | POA: Diagnosis not present

## 2017-05-02 DIAGNOSIS — D631 Anemia in chronic kidney disease: Secondary | ICD-10-CM | POA: Diagnosis not present

## 2017-05-02 DIAGNOSIS — N186 End stage renal disease: Secondary | ICD-10-CM | POA: Diagnosis not present

## 2017-05-02 DIAGNOSIS — D509 Iron deficiency anemia, unspecified: Secondary | ICD-10-CM | POA: Diagnosis not present

## 2017-05-05 ENCOUNTER — Other Ambulatory Visit: Payer: Self-pay | Admitting: Internal Medicine

## 2017-05-05 DIAGNOSIS — D631 Anemia in chronic kidney disease: Secondary | ICD-10-CM | POA: Diagnosis not present

## 2017-05-05 DIAGNOSIS — N186 End stage renal disease: Secondary | ICD-10-CM | POA: Diagnosis not present

## 2017-05-05 DIAGNOSIS — E876 Hypokalemia: Secondary | ICD-10-CM | POA: Diagnosis not present

## 2017-05-05 DIAGNOSIS — D509 Iron deficiency anemia, unspecified: Secondary | ICD-10-CM | POA: Diagnosis not present

## 2017-05-05 DIAGNOSIS — N2581 Secondary hyperparathyroidism of renal origin: Secondary | ICD-10-CM | POA: Diagnosis not present

## 2017-05-07 DIAGNOSIS — D509 Iron deficiency anemia, unspecified: Secondary | ICD-10-CM | POA: Diagnosis not present

## 2017-05-07 DIAGNOSIS — N186 End stage renal disease: Secondary | ICD-10-CM | POA: Diagnosis not present

## 2017-05-07 DIAGNOSIS — N2581 Secondary hyperparathyroidism of renal origin: Secondary | ICD-10-CM | POA: Diagnosis not present

## 2017-05-07 DIAGNOSIS — E876 Hypokalemia: Secondary | ICD-10-CM | POA: Diagnosis not present

## 2017-05-07 DIAGNOSIS — D631 Anemia in chronic kidney disease: Secondary | ICD-10-CM | POA: Diagnosis not present

## 2017-05-09 DIAGNOSIS — D509 Iron deficiency anemia, unspecified: Secondary | ICD-10-CM | POA: Diagnosis not present

## 2017-05-09 DIAGNOSIS — N2581 Secondary hyperparathyroidism of renal origin: Secondary | ICD-10-CM | POA: Diagnosis not present

## 2017-05-09 DIAGNOSIS — E876 Hypokalemia: Secondary | ICD-10-CM | POA: Diagnosis not present

## 2017-05-09 DIAGNOSIS — N186 End stage renal disease: Secondary | ICD-10-CM | POA: Diagnosis not present

## 2017-05-09 DIAGNOSIS — D631 Anemia in chronic kidney disease: Secondary | ICD-10-CM | POA: Diagnosis not present

## 2017-05-12 DIAGNOSIS — N186 End stage renal disease: Secondary | ICD-10-CM | POA: Diagnosis not present

## 2017-05-12 DIAGNOSIS — N2581 Secondary hyperparathyroidism of renal origin: Secondary | ICD-10-CM | POA: Diagnosis not present

## 2017-05-12 DIAGNOSIS — D631 Anemia in chronic kidney disease: Secondary | ICD-10-CM | POA: Diagnosis not present

## 2017-05-12 DIAGNOSIS — D509 Iron deficiency anemia, unspecified: Secondary | ICD-10-CM | POA: Diagnosis not present

## 2017-05-12 DIAGNOSIS — E876 Hypokalemia: Secondary | ICD-10-CM | POA: Diagnosis not present

## 2017-05-14 DIAGNOSIS — N2581 Secondary hyperparathyroidism of renal origin: Secondary | ICD-10-CM | POA: Diagnosis not present

## 2017-05-14 DIAGNOSIS — N186 End stage renal disease: Secondary | ICD-10-CM | POA: Diagnosis not present

## 2017-05-14 DIAGNOSIS — D631 Anemia in chronic kidney disease: Secondary | ICD-10-CM | POA: Diagnosis not present

## 2017-05-14 DIAGNOSIS — E876 Hypokalemia: Secondary | ICD-10-CM | POA: Diagnosis not present

## 2017-05-14 DIAGNOSIS — D509 Iron deficiency anemia, unspecified: Secondary | ICD-10-CM | POA: Diagnosis not present

## 2017-05-16 DIAGNOSIS — I120 Hypertensive chronic kidney disease with stage 5 chronic kidney disease or end stage renal disease: Secondary | ICD-10-CM | POA: Diagnosis not present

## 2017-05-16 DIAGNOSIS — D631 Anemia in chronic kidney disease: Secondary | ICD-10-CM | POA: Diagnosis not present

## 2017-05-16 DIAGNOSIS — N2581 Secondary hyperparathyroidism of renal origin: Secondary | ICD-10-CM | POA: Diagnosis not present

## 2017-05-16 DIAGNOSIS — N186 End stage renal disease: Secondary | ICD-10-CM | POA: Diagnosis not present

## 2017-05-16 DIAGNOSIS — Z992 Dependence on renal dialysis: Secondary | ICD-10-CM | POA: Diagnosis not present

## 2017-05-16 DIAGNOSIS — D509 Iron deficiency anemia, unspecified: Secondary | ICD-10-CM | POA: Diagnosis not present

## 2017-05-19 DIAGNOSIS — N2581 Secondary hyperparathyroidism of renal origin: Secondary | ICD-10-CM | POA: Diagnosis not present

## 2017-05-19 DIAGNOSIS — D631 Anemia in chronic kidney disease: Secondary | ICD-10-CM | POA: Diagnosis not present

## 2017-05-19 DIAGNOSIS — D509 Iron deficiency anemia, unspecified: Secondary | ICD-10-CM | POA: Diagnosis not present

## 2017-05-19 DIAGNOSIS — N186 End stage renal disease: Secondary | ICD-10-CM | POA: Diagnosis not present

## 2017-05-21 DIAGNOSIS — N2581 Secondary hyperparathyroidism of renal origin: Secondary | ICD-10-CM | POA: Diagnosis not present

## 2017-05-21 DIAGNOSIS — D509 Iron deficiency anemia, unspecified: Secondary | ICD-10-CM | POA: Diagnosis not present

## 2017-05-21 DIAGNOSIS — N186 End stage renal disease: Secondary | ICD-10-CM | POA: Diagnosis not present

## 2017-05-21 DIAGNOSIS — D631 Anemia in chronic kidney disease: Secondary | ICD-10-CM | POA: Diagnosis not present

## 2017-05-22 DIAGNOSIS — Z7952 Long term (current) use of systemic steroids: Secondary | ICD-10-CM | POA: Diagnosis not present

## 2017-05-22 DIAGNOSIS — Z841 Family history of disorders of kidney and ureter: Secondary | ICD-10-CM | POA: Diagnosis not present

## 2017-05-22 DIAGNOSIS — Z8249 Family history of ischemic heart disease and other diseases of the circulatory system: Secondary | ICD-10-CM | POA: Diagnosis not present

## 2017-05-22 DIAGNOSIS — Z853 Personal history of malignant neoplasm of breast: Secondary | ICD-10-CM | POA: Diagnosis not present

## 2017-05-22 DIAGNOSIS — Z992 Dependence on renal dialysis: Secondary | ICD-10-CM | POA: Diagnosis not present

## 2017-05-22 DIAGNOSIS — N186 End stage renal disease: Secondary | ICD-10-CM | POA: Diagnosis not present

## 2017-05-22 DIAGNOSIS — Z809 Family history of malignant neoplasm, unspecified: Secondary | ICD-10-CM | POA: Diagnosis not present

## 2017-05-22 DIAGNOSIS — Z7984 Long term (current) use of oral hypoglycemic drugs: Secondary | ICD-10-CM | POA: Diagnosis not present

## 2017-05-22 DIAGNOSIS — Z888 Allergy status to other drugs, medicaments and biological substances status: Secondary | ICD-10-CM | POA: Diagnosis not present

## 2017-05-22 DIAGNOSIS — T82590A Other mechanical complication of surgically created arteriovenous fistula, initial encounter: Secondary | ICD-10-CM | POA: Diagnosis not present

## 2017-05-22 DIAGNOSIS — Z9889 Other specified postprocedural states: Secondary | ICD-10-CM | POA: Diagnosis not present

## 2017-05-22 DIAGNOSIS — I252 Old myocardial infarction: Secondary | ICD-10-CM | POA: Diagnosis not present

## 2017-05-22 DIAGNOSIS — T82858A Stenosis of vascular prosthetic devices, implants and grafts, initial encounter: Secondary | ICD-10-CM | POA: Diagnosis not present

## 2017-05-22 DIAGNOSIS — Z79891 Long term (current) use of opiate analgesic: Secondary | ICD-10-CM | POA: Diagnosis not present

## 2017-05-22 DIAGNOSIS — Z79899 Other long term (current) drug therapy: Secondary | ICD-10-CM | POA: Diagnosis not present

## 2017-05-22 DIAGNOSIS — Z833 Family history of diabetes mellitus: Secondary | ICD-10-CM | POA: Diagnosis not present

## 2017-05-22 DIAGNOSIS — E1122 Type 2 diabetes mellitus with diabetic chronic kidney disease: Secondary | ICD-10-CM | POA: Diagnosis not present

## 2017-05-22 DIAGNOSIS — Z885 Allergy status to narcotic agent status: Secondary | ICD-10-CM | POA: Diagnosis not present

## 2017-05-22 DIAGNOSIS — Z7982 Long term (current) use of aspirin: Secondary | ICD-10-CM | POA: Diagnosis not present

## 2017-05-23 DIAGNOSIS — D509 Iron deficiency anemia, unspecified: Secondary | ICD-10-CM | POA: Diagnosis not present

## 2017-05-23 DIAGNOSIS — N186 End stage renal disease: Secondary | ICD-10-CM | POA: Diagnosis not present

## 2017-05-23 DIAGNOSIS — N2581 Secondary hyperparathyroidism of renal origin: Secondary | ICD-10-CM | POA: Diagnosis not present

## 2017-05-23 DIAGNOSIS — D631 Anemia in chronic kidney disease: Secondary | ICD-10-CM | POA: Diagnosis not present

## 2017-05-26 DIAGNOSIS — D631 Anemia in chronic kidney disease: Secondary | ICD-10-CM | POA: Diagnosis not present

## 2017-05-26 DIAGNOSIS — D509 Iron deficiency anemia, unspecified: Secondary | ICD-10-CM | POA: Diagnosis not present

## 2017-05-26 DIAGNOSIS — N2581 Secondary hyperparathyroidism of renal origin: Secondary | ICD-10-CM | POA: Diagnosis not present

## 2017-05-26 DIAGNOSIS — N186 End stage renal disease: Secondary | ICD-10-CM | POA: Diagnosis not present

## 2017-05-28 DIAGNOSIS — D631 Anemia in chronic kidney disease: Secondary | ICD-10-CM | POA: Diagnosis not present

## 2017-05-28 DIAGNOSIS — N2581 Secondary hyperparathyroidism of renal origin: Secondary | ICD-10-CM | POA: Diagnosis not present

## 2017-05-28 DIAGNOSIS — D509 Iron deficiency anemia, unspecified: Secondary | ICD-10-CM | POA: Diagnosis not present

## 2017-05-28 DIAGNOSIS — N186 End stage renal disease: Secondary | ICD-10-CM | POA: Diagnosis not present

## 2017-05-30 DIAGNOSIS — N186 End stage renal disease: Secondary | ICD-10-CM | POA: Diagnosis not present

## 2017-05-30 DIAGNOSIS — N2581 Secondary hyperparathyroidism of renal origin: Secondary | ICD-10-CM | POA: Diagnosis not present

## 2017-05-30 DIAGNOSIS — D509 Iron deficiency anemia, unspecified: Secondary | ICD-10-CM | POA: Diagnosis not present

## 2017-05-30 DIAGNOSIS — D631 Anemia in chronic kidney disease: Secondary | ICD-10-CM | POA: Diagnosis not present

## 2017-06-02 DIAGNOSIS — N186 End stage renal disease: Secondary | ICD-10-CM | POA: Diagnosis not present

## 2017-06-02 DIAGNOSIS — N2581 Secondary hyperparathyroidism of renal origin: Secondary | ICD-10-CM | POA: Diagnosis not present

## 2017-06-02 DIAGNOSIS — D509 Iron deficiency anemia, unspecified: Secondary | ICD-10-CM | POA: Diagnosis not present

## 2017-06-02 DIAGNOSIS — D631 Anemia in chronic kidney disease: Secondary | ICD-10-CM | POA: Diagnosis not present

## 2017-06-04 DIAGNOSIS — D509 Iron deficiency anemia, unspecified: Secondary | ICD-10-CM | POA: Diagnosis not present

## 2017-06-04 DIAGNOSIS — D631 Anemia in chronic kidney disease: Secondary | ICD-10-CM | POA: Diagnosis not present

## 2017-06-04 DIAGNOSIS — N186 End stage renal disease: Secondary | ICD-10-CM | POA: Diagnosis not present

## 2017-06-04 DIAGNOSIS — N2581 Secondary hyperparathyroidism of renal origin: Secondary | ICD-10-CM | POA: Diagnosis not present

## 2017-06-06 DIAGNOSIS — N2581 Secondary hyperparathyroidism of renal origin: Secondary | ICD-10-CM | POA: Diagnosis not present

## 2017-06-06 DIAGNOSIS — N186 End stage renal disease: Secondary | ICD-10-CM | POA: Diagnosis not present

## 2017-06-06 DIAGNOSIS — D631 Anemia in chronic kidney disease: Secondary | ICD-10-CM | POA: Diagnosis not present

## 2017-06-06 DIAGNOSIS — D509 Iron deficiency anemia, unspecified: Secondary | ICD-10-CM | POA: Diagnosis not present

## 2017-06-09 DIAGNOSIS — N2581 Secondary hyperparathyroidism of renal origin: Secondary | ICD-10-CM | POA: Diagnosis not present

## 2017-06-09 DIAGNOSIS — N186 End stage renal disease: Secondary | ICD-10-CM | POA: Diagnosis not present

## 2017-06-09 DIAGNOSIS — D509 Iron deficiency anemia, unspecified: Secondary | ICD-10-CM | POA: Diagnosis not present

## 2017-06-09 DIAGNOSIS — D631 Anemia in chronic kidney disease: Secondary | ICD-10-CM | POA: Diagnosis not present

## 2017-06-11 DIAGNOSIS — D509 Iron deficiency anemia, unspecified: Secondary | ICD-10-CM | POA: Diagnosis not present

## 2017-06-11 DIAGNOSIS — D631 Anemia in chronic kidney disease: Secondary | ICD-10-CM | POA: Diagnosis not present

## 2017-06-11 DIAGNOSIS — N186 End stage renal disease: Secondary | ICD-10-CM | POA: Diagnosis not present

## 2017-06-11 DIAGNOSIS — N2581 Secondary hyperparathyroidism of renal origin: Secondary | ICD-10-CM | POA: Diagnosis not present

## 2017-06-13 DIAGNOSIS — N186 End stage renal disease: Secondary | ICD-10-CM | POA: Diagnosis not present

## 2017-06-13 DIAGNOSIS — D631 Anemia in chronic kidney disease: Secondary | ICD-10-CM | POA: Diagnosis not present

## 2017-06-13 DIAGNOSIS — D509 Iron deficiency anemia, unspecified: Secondary | ICD-10-CM | POA: Diagnosis not present

## 2017-06-13 DIAGNOSIS — N2581 Secondary hyperparathyroidism of renal origin: Secondary | ICD-10-CM | POA: Diagnosis not present

## 2017-06-16 DIAGNOSIS — N2581 Secondary hyperparathyroidism of renal origin: Secondary | ICD-10-CM | POA: Diagnosis not present

## 2017-06-16 DIAGNOSIS — D631 Anemia in chronic kidney disease: Secondary | ICD-10-CM | POA: Diagnosis not present

## 2017-06-16 DIAGNOSIS — D509 Iron deficiency anemia, unspecified: Secondary | ICD-10-CM | POA: Diagnosis not present

## 2017-06-16 DIAGNOSIS — Z992 Dependence on renal dialysis: Secondary | ICD-10-CM | POA: Diagnosis not present

## 2017-06-16 DIAGNOSIS — N186 End stage renal disease: Secondary | ICD-10-CM | POA: Diagnosis not present

## 2017-06-16 DIAGNOSIS — I120 Hypertensive chronic kidney disease with stage 5 chronic kidney disease or end stage renal disease: Secondary | ICD-10-CM | POA: Diagnosis not present

## 2017-06-18 DIAGNOSIS — D509 Iron deficiency anemia, unspecified: Secondary | ICD-10-CM | POA: Diagnosis not present

## 2017-06-18 DIAGNOSIS — N186 End stage renal disease: Secondary | ICD-10-CM | POA: Diagnosis not present

## 2017-06-18 DIAGNOSIS — D631 Anemia in chronic kidney disease: Secondary | ICD-10-CM | POA: Diagnosis not present

## 2017-06-18 DIAGNOSIS — N2581 Secondary hyperparathyroidism of renal origin: Secondary | ICD-10-CM | POA: Diagnosis not present

## 2017-06-20 DIAGNOSIS — D509 Iron deficiency anemia, unspecified: Secondary | ICD-10-CM | POA: Diagnosis not present

## 2017-06-20 DIAGNOSIS — N186 End stage renal disease: Secondary | ICD-10-CM | POA: Diagnosis not present

## 2017-06-20 DIAGNOSIS — N2581 Secondary hyperparathyroidism of renal origin: Secondary | ICD-10-CM | POA: Diagnosis not present

## 2017-06-20 DIAGNOSIS — D631 Anemia in chronic kidney disease: Secondary | ICD-10-CM | POA: Diagnosis not present

## 2017-06-23 DIAGNOSIS — N2581 Secondary hyperparathyroidism of renal origin: Secondary | ICD-10-CM | POA: Diagnosis not present

## 2017-06-23 DIAGNOSIS — N186 End stage renal disease: Secondary | ICD-10-CM | POA: Diagnosis not present

## 2017-06-23 DIAGNOSIS — D631 Anemia in chronic kidney disease: Secondary | ICD-10-CM | POA: Diagnosis not present

## 2017-06-23 DIAGNOSIS — D509 Iron deficiency anemia, unspecified: Secondary | ICD-10-CM | POA: Diagnosis not present

## 2017-06-25 DIAGNOSIS — D631 Anemia in chronic kidney disease: Secondary | ICD-10-CM | POA: Diagnosis not present

## 2017-06-25 DIAGNOSIS — D509 Iron deficiency anemia, unspecified: Secondary | ICD-10-CM | POA: Diagnosis not present

## 2017-06-25 DIAGNOSIS — N2581 Secondary hyperparathyroidism of renal origin: Secondary | ICD-10-CM | POA: Diagnosis not present

## 2017-06-25 DIAGNOSIS — N186 End stage renal disease: Secondary | ICD-10-CM | POA: Diagnosis not present

## 2017-06-27 DIAGNOSIS — D631 Anemia in chronic kidney disease: Secondary | ICD-10-CM | POA: Diagnosis not present

## 2017-06-27 DIAGNOSIS — N186 End stage renal disease: Secondary | ICD-10-CM | POA: Diagnosis not present

## 2017-06-27 DIAGNOSIS — N2581 Secondary hyperparathyroidism of renal origin: Secondary | ICD-10-CM | POA: Diagnosis not present

## 2017-06-27 DIAGNOSIS — D509 Iron deficiency anemia, unspecified: Secondary | ICD-10-CM | POA: Diagnosis not present

## 2017-06-30 DIAGNOSIS — D631 Anemia in chronic kidney disease: Secondary | ICD-10-CM | POA: Diagnosis not present

## 2017-06-30 DIAGNOSIS — N186 End stage renal disease: Secondary | ICD-10-CM | POA: Diagnosis not present

## 2017-06-30 DIAGNOSIS — N2581 Secondary hyperparathyroidism of renal origin: Secondary | ICD-10-CM | POA: Diagnosis not present

## 2017-06-30 DIAGNOSIS — D509 Iron deficiency anemia, unspecified: Secondary | ICD-10-CM | POA: Diagnosis not present

## 2017-07-02 DIAGNOSIS — N186 End stage renal disease: Secondary | ICD-10-CM | POA: Diagnosis not present

## 2017-07-02 DIAGNOSIS — D631 Anemia in chronic kidney disease: Secondary | ICD-10-CM | POA: Diagnosis not present

## 2017-07-02 DIAGNOSIS — N2581 Secondary hyperparathyroidism of renal origin: Secondary | ICD-10-CM | POA: Diagnosis not present

## 2017-07-02 DIAGNOSIS — D509 Iron deficiency anemia, unspecified: Secondary | ICD-10-CM | POA: Diagnosis not present

## 2017-07-04 DIAGNOSIS — D631 Anemia in chronic kidney disease: Secondary | ICD-10-CM | POA: Diagnosis not present

## 2017-07-04 DIAGNOSIS — N186 End stage renal disease: Secondary | ICD-10-CM | POA: Diagnosis not present

## 2017-07-04 DIAGNOSIS — D509 Iron deficiency anemia, unspecified: Secondary | ICD-10-CM | POA: Diagnosis not present

## 2017-07-04 DIAGNOSIS — N2581 Secondary hyperparathyroidism of renal origin: Secondary | ICD-10-CM | POA: Diagnosis not present

## 2017-07-07 DIAGNOSIS — D631 Anemia in chronic kidney disease: Secondary | ICD-10-CM | POA: Diagnosis not present

## 2017-07-07 DIAGNOSIS — N186 End stage renal disease: Secondary | ICD-10-CM | POA: Diagnosis not present

## 2017-07-07 DIAGNOSIS — D509 Iron deficiency anemia, unspecified: Secondary | ICD-10-CM | POA: Diagnosis not present

## 2017-07-07 DIAGNOSIS — N2581 Secondary hyperparathyroidism of renal origin: Secondary | ICD-10-CM | POA: Diagnosis not present

## 2017-07-09 DIAGNOSIS — D631 Anemia in chronic kidney disease: Secondary | ICD-10-CM | POA: Diagnosis not present

## 2017-07-09 DIAGNOSIS — N186 End stage renal disease: Secondary | ICD-10-CM | POA: Diagnosis not present

## 2017-07-09 DIAGNOSIS — D509 Iron deficiency anemia, unspecified: Secondary | ICD-10-CM | POA: Diagnosis not present

## 2017-07-09 DIAGNOSIS — N2581 Secondary hyperparathyroidism of renal origin: Secondary | ICD-10-CM | POA: Diagnosis not present

## 2017-07-10 DIAGNOSIS — N186 End stage renal disease: Secondary | ICD-10-CM | POA: Diagnosis not present

## 2017-07-11 DIAGNOSIS — N186 End stage renal disease: Secondary | ICD-10-CM | POA: Diagnosis not present

## 2017-07-11 DIAGNOSIS — N2581 Secondary hyperparathyroidism of renal origin: Secondary | ICD-10-CM | POA: Diagnosis not present

## 2017-07-11 DIAGNOSIS — D509 Iron deficiency anemia, unspecified: Secondary | ICD-10-CM | POA: Diagnosis not present

## 2017-07-11 DIAGNOSIS — D631 Anemia in chronic kidney disease: Secondary | ICD-10-CM | POA: Diagnosis not present

## 2017-07-14 DIAGNOSIS — N2581 Secondary hyperparathyroidism of renal origin: Secondary | ICD-10-CM | POA: Diagnosis not present

## 2017-07-14 DIAGNOSIS — D509 Iron deficiency anemia, unspecified: Secondary | ICD-10-CM | POA: Diagnosis not present

## 2017-07-14 DIAGNOSIS — N186 End stage renal disease: Secondary | ICD-10-CM | POA: Diagnosis not present

## 2017-07-14 DIAGNOSIS — D631 Anemia in chronic kidney disease: Secondary | ICD-10-CM | POA: Diagnosis not present

## 2017-07-16 ENCOUNTER — Other Ambulatory Visit: Payer: Self-pay | Admitting: Internal Medicine

## 2017-07-16 ENCOUNTER — Telehealth: Payer: Self-pay | Admitting: Emergency Medicine

## 2017-07-16 DIAGNOSIS — N186 End stage renal disease: Secondary | ICD-10-CM | POA: Diagnosis not present

## 2017-07-16 DIAGNOSIS — N2581 Secondary hyperparathyroidism of renal origin: Secondary | ICD-10-CM | POA: Diagnosis not present

## 2017-07-16 DIAGNOSIS — I120 Hypertensive chronic kidney disease with stage 5 chronic kidney disease or end stage renal disease: Secondary | ICD-10-CM | POA: Diagnosis not present

## 2017-07-16 DIAGNOSIS — D509 Iron deficiency anemia, unspecified: Secondary | ICD-10-CM | POA: Diagnosis not present

## 2017-07-16 DIAGNOSIS — Z992 Dependence on renal dialysis: Secondary | ICD-10-CM | POA: Diagnosis not present

## 2017-07-16 DIAGNOSIS — D631 Anemia in chronic kidney disease: Secondary | ICD-10-CM | POA: Diagnosis not present

## 2017-07-16 NOTE — Telephone Encounter (Signed)
Called patient to schedule AWV. Spoke to patients daughter who stated she is living with her brother in [place] at the time. Declined for now until she returns back to .

## 2017-07-18 ENCOUNTER — Other Ambulatory Visit: Payer: Self-pay | Admitting: Internal Medicine

## 2017-07-18 DIAGNOSIS — D509 Iron deficiency anemia, unspecified: Secondary | ICD-10-CM | POA: Diagnosis not present

## 2017-07-18 DIAGNOSIS — D631 Anemia in chronic kidney disease: Secondary | ICD-10-CM | POA: Diagnosis not present

## 2017-07-18 DIAGNOSIS — N2581 Secondary hyperparathyroidism of renal origin: Secondary | ICD-10-CM | POA: Diagnosis not present

## 2017-07-18 DIAGNOSIS — N186 End stage renal disease: Secondary | ICD-10-CM | POA: Diagnosis not present

## 2017-07-21 DIAGNOSIS — D631 Anemia in chronic kidney disease: Secondary | ICD-10-CM | POA: Diagnosis not present

## 2017-07-21 DIAGNOSIS — N2581 Secondary hyperparathyroidism of renal origin: Secondary | ICD-10-CM | POA: Diagnosis not present

## 2017-07-21 DIAGNOSIS — D509 Iron deficiency anemia, unspecified: Secondary | ICD-10-CM | POA: Diagnosis not present

## 2017-07-21 DIAGNOSIS — N186 End stage renal disease: Secondary | ICD-10-CM | POA: Diagnosis not present

## 2017-07-23 DIAGNOSIS — N186 End stage renal disease: Secondary | ICD-10-CM | POA: Diagnosis not present

## 2017-07-23 DIAGNOSIS — D509 Iron deficiency anemia, unspecified: Secondary | ICD-10-CM | POA: Diagnosis not present

## 2017-07-23 DIAGNOSIS — N2581 Secondary hyperparathyroidism of renal origin: Secondary | ICD-10-CM | POA: Diagnosis not present

## 2017-07-23 DIAGNOSIS — D631 Anemia in chronic kidney disease: Secondary | ICD-10-CM | POA: Diagnosis not present

## 2017-07-25 DIAGNOSIS — N2581 Secondary hyperparathyroidism of renal origin: Secondary | ICD-10-CM | POA: Diagnosis not present

## 2017-07-25 DIAGNOSIS — D631 Anemia in chronic kidney disease: Secondary | ICD-10-CM | POA: Diagnosis not present

## 2017-07-25 DIAGNOSIS — N186 End stage renal disease: Secondary | ICD-10-CM | POA: Diagnosis not present

## 2017-07-25 DIAGNOSIS — D509 Iron deficiency anemia, unspecified: Secondary | ICD-10-CM | POA: Diagnosis not present

## 2017-07-28 DIAGNOSIS — N2581 Secondary hyperparathyroidism of renal origin: Secondary | ICD-10-CM | POA: Diagnosis not present

## 2017-07-28 DIAGNOSIS — D631 Anemia in chronic kidney disease: Secondary | ICD-10-CM | POA: Diagnosis not present

## 2017-07-28 DIAGNOSIS — D509 Iron deficiency anemia, unspecified: Secondary | ICD-10-CM | POA: Diagnosis not present

## 2017-07-28 DIAGNOSIS — E1122 Type 2 diabetes mellitus with diabetic chronic kidney disease: Secondary | ICD-10-CM | POA: Diagnosis not present

## 2017-07-28 DIAGNOSIS — N186 End stage renal disease: Secondary | ICD-10-CM | POA: Diagnosis not present

## 2017-07-30 DIAGNOSIS — D509 Iron deficiency anemia, unspecified: Secondary | ICD-10-CM | POA: Diagnosis not present

## 2017-07-30 DIAGNOSIS — N2581 Secondary hyperparathyroidism of renal origin: Secondary | ICD-10-CM | POA: Diagnosis not present

## 2017-07-30 DIAGNOSIS — D631 Anemia in chronic kidney disease: Secondary | ICD-10-CM | POA: Diagnosis not present

## 2017-07-30 DIAGNOSIS — N186 End stage renal disease: Secondary | ICD-10-CM | POA: Diagnosis not present

## 2017-08-02 DIAGNOSIS — N186 End stage renal disease: Secondary | ICD-10-CM | POA: Diagnosis not present

## 2017-08-02 DIAGNOSIS — D509 Iron deficiency anemia, unspecified: Secondary | ICD-10-CM | POA: Diagnosis not present

## 2017-08-02 DIAGNOSIS — N2581 Secondary hyperparathyroidism of renal origin: Secondary | ICD-10-CM | POA: Diagnosis not present

## 2017-08-02 DIAGNOSIS — D631 Anemia in chronic kidney disease: Secondary | ICD-10-CM | POA: Diagnosis not present

## 2017-08-04 DIAGNOSIS — D509 Iron deficiency anemia, unspecified: Secondary | ICD-10-CM | POA: Diagnosis not present

## 2017-08-04 DIAGNOSIS — N186 End stage renal disease: Secondary | ICD-10-CM | POA: Diagnosis not present

## 2017-08-04 DIAGNOSIS — N2581 Secondary hyperparathyroidism of renal origin: Secondary | ICD-10-CM | POA: Diagnosis not present

## 2017-08-04 DIAGNOSIS — D631 Anemia in chronic kidney disease: Secondary | ICD-10-CM | POA: Diagnosis not present

## 2017-08-06 DIAGNOSIS — N186 End stage renal disease: Secondary | ICD-10-CM | POA: Diagnosis not present

## 2017-08-06 DIAGNOSIS — D631 Anemia in chronic kidney disease: Secondary | ICD-10-CM | POA: Diagnosis not present

## 2017-08-06 DIAGNOSIS — N2581 Secondary hyperparathyroidism of renal origin: Secondary | ICD-10-CM | POA: Diagnosis not present

## 2017-08-06 DIAGNOSIS — D509 Iron deficiency anemia, unspecified: Secondary | ICD-10-CM | POA: Diagnosis not present

## 2017-08-08 DIAGNOSIS — D509 Iron deficiency anemia, unspecified: Secondary | ICD-10-CM | POA: Diagnosis not present

## 2017-08-08 DIAGNOSIS — D631 Anemia in chronic kidney disease: Secondary | ICD-10-CM | POA: Diagnosis not present

## 2017-08-08 DIAGNOSIS — N2581 Secondary hyperparathyroidism of renal origin: Secondary | ICD-10-CM | POA: Diagnosis not present

## 2017-08-08 DIAGNOSIS — N186 End stage renal disease: Secondary | ICD-10-CM | POA: Diagnosis not present

## 2017-08-11 DIAGNOSIS — D631 Anemia in chronic kidney disease: Secondary | ICD-10-CM | POA: Diagnosis not present

## 2017-08-11 DIAGNOSIS — N186 End stage renal disease: Secondary | ICD-10-CM | POA: Diagnosis not present

## 2017-08-11 DIAGNOSIS — N2581 Secondary hyperparathyroidism of renal origin: Secondary | ICD-10-CM | POA: Diagnosis not present

## 2017-08-11 DIAGNOSIS — D509 Iron deficiency anemia, unspecified: Secondary | ICD-10-CM | POA: Diagnosis not present

## 2017-08-13 DIAGNOSIS — D631 Anemia in chronic kidney disease: Secondary | ICD-10-CM | POA: Diagnosis not present

## 2017-08-13 DIAGNOSIS — N186 End stage renal disease: Secondary | ICD-10-CM | POA: Diagnosis not present

## 2017-08-13 DIAGNOSIS — N2581 Secondary hyperparathyroidism of renal origin: Secondary | ICD-10-CM | POA: Diagnosis not present

## 2017-08-13 DIAGNOSIS — D509 Iron deficiency anemia, unspecified: Secondary | ICD-10-CM | POA: Diagnosis not present

## 2017-08-15 DIAGNOSIS — D631 Anemia in chronic kidney disease: Secondary | ICD-10-CM | POA: Diagnosis not present

## 2017-08-15 DIAGNOSIS — N2581 Secondary hyperparathyroidism of renal origin: Secondary | ICD-10-CM | POA: Diagnosis not present

## 2017-08-15 DIAGNOSIS — D509 Iron deficiency anemia, unspecified: Secondary | ICD-10-CM | POA: Diagnosis not present

## 2017-08-15 DIAGNOSIS — N186 End stage renal disease: Secondary | ICD-10-CM | POA: Diagnosis not present

## 2017-08-17 ENCOUNTER — Other Ambulatory Visit: Payer: Self-pay | Admitting: Internal Medicine

## 2017-08-18 DIAGNOSIS — J9601 Acute respiratory failure with hypoxia: Secondary | ICD-10-CM | POA: Diagnosis not present

## 2017-08-18 DIAGNOSIS — E875 Hyperkalemia: Secondary | ICD-10-CM | POA: Diagnosis not present

## 2017-08-18 DIAGNOSIS — Z7982 Long term (current) use of aspirin: Secondary | ICD-10-CM | POA: Diagnosis not present

## 2017-08-18 DIAGNOSIS — D631 Anemia in chronic kidney disease: Secondary | ICD-10-CM | POA: Diagnosis present

## 2017-08-18 DIAGNOSIS — I472 Ventricular tachycardia: Secondary | ICD-10-CM | POA: Diagnosis not present

## 2017-08-18 DIAGNOSIS — I2510 Atherosclerotic heart disease of native coronary artery without angina pectoris: Secondary | ICD-10-CM | POA: Diagnosis not present

## 2017-08-18 DIAGNOSIS — Z79899 Other long term (current) drug therapy: Secondary | ICD-10-CM | POA: Diagnosis not present

## 2017-08-18 DIAGNOSIS — T82858A Stenosis of vascular prosthetic devices, implants and grafts, initial encounter: Secondary | ICD-10-CM | POA: Diagnosis present

## 2017-08-18 DIAGNOSIS — T82590A Other mechanical complication of surgically created arteriovenous fistula, initial encounter: Secondary | ICD-10-CM | POA: Diagnosis not present

## 2017-08-18 DIAGNOSIS — I509 Heart failure, unspecified: Secondary | ICD-10-CM | POA: Diagnosis not present

## 2017-08-18 DIAGNOSIS — Z8781 Personal history of (healed) traumatic fracture: Secondary | ICD-10-CM | POA: Diagnosis not present

## 2017-08-18 DIAGNOSIS — Z794 Long term (current) use of insulin: Secondary | ICD-10-CM | POA: Diagnosis not present

## 2017-08-18 DIAGNOSIS — Z853 Personal history of malignant neoplasm of breast: Secondary | ICD-10-CM | POA: Diagnosis not present

## 2017-08-18 DIAGNOSIS — I429 Cardiomyopathy, unspecified: Secondary | ICD-10-CM | POA: Diagnosis present

## 2017-08-18 DIAGNOSIS — N2581 Secondary hyperparathyroidism of renal origin: Secondary | ICD-10-CM | POA: Diagnosis not present

## 2017-08-18 DIAGNOSIS — N289 Disorder of kidney and ureter, unspecified: Secondary | ICD-10-CM | POA: Diagnosis not present

## 2017-08-18 DIAGNOSIS — E1122 Type 2 diabetes mellitus with diabetic chronic kidney disease: Secondary | ICD-10-CM | POA: Diagnosis not present

## 2017-08-18 DIAGNOSIS — E1165 Type 2 diabetes mellitus with hyperglycemia: Secondary | ICD-10-CM | POA: Diagnosis not present

## 2017-08-18 DIAGNOSIS — I083 Combined rheumatic disorders of mitral, aortic and tricuspid valves: Secondary | ICD-10-CM | POA: Diagnosis present

## 2017-08-18 DIAGNOSIS — E1121 Type 2 diabetes mellitus with diabetic nephropathy: Secondary | ICD-10-CM | POA: Diagnosis not present

## 2017-08-18 DIAGNOSIS — E039 Hypothyroidism, unspecified: Secondary | ICD-10-CM | POA: Diagnosis not present

## 2017-08-18 DIAGNOSIS — I120 Hypertensive chronic kidney disease with stage 5 chronic kidney disease or end stage renal disease: Secondary | ICD-10-CM | POA: Diagnosis not present

## 2017-08-18 DIAGNOSIS — J90 Pleural effusion, not elsewhere classified: Secondary | ICD-10-CM | POA: Diagnosis not present

## 2017-08-18 DIAGNOSIS — I5023 Acute on chronic systolic (congestive) heart failure: Secondary | ICD-10-CM | POA: Diagnosis not present

## 2017-08-18 DIAGNOSIS — E119 Type 2 diabetes mellitus without complications: Secondary | ICD-10-CM | POA: Diagnosis not present

## 2017-08-18 DIAGNOSIS — E785 Hyperlipidemia, unspecified: Secondary | ICD-10-CM | POA: Diagnosis not present

## 2017-08-18 DIAGNOSIS — Z888 Allergy status to other drugs, medicaments and biological substances status: Secondary | ICD-10-CM | POA: Diagnosis not present

## 2017-08-18 DIAGNOSIS — N186 End stage renal disease: Secondary | ICD-10-CM | POA: Diagnosis not present

## 2017-08-18 DIAGNOSIS — I132 Hypertensive heart and chronic kidney disease with heart failure and with stage 5 chronic kidney disease, or end stage renal disease: Secondary | ICD-10-CM | POA: Diagnosis present

## 2017-08-18 DIAGNOSIS — Z992 Dependence on renal dialysis: Secondary | ICD-10-CM | POA: Diagnosis not present

## 2017-08-18 DIAGNOSIS — I272 Pulmonary hypertension, unspecified: Secondary | ICD-10-CM | POA: Diagnosis present

## 2017-08-18 DIAGNOSIS — R0902 Hypoxemia: Secondary | ICD-10-CM | POA: Diagnosis not present

## 2017-08-18 DIAGNOSIS — I10 Essential (primary) hypertension: Secondary | ICD-10-CM | POA: Diagnosis not present

## 2017-08-18 DIAGNOSIS — R0602 Shortness of breath: Secondary | ICD-10-CM | POA: Diagnosis not present

## 2017-08-18 DIAGNOSIS — R0600 Dyspnea, unspecified: Secondary | ICD-10-CM | POA: Diagnosis not present

## 2017-08-18 DIAGNOSIS — D509 Iron deficiency anemia, unspecified: Secondary | ICD-10-CM | POA: Diagnosis not present

## 2017-08-18 DIAGNOSIS — Z9012 Acquired absence of left breast and nipple: Secondary | ICD-10-CM | POA: Diagnosis not present

## 2017-08-25 DIAGNOSIS — D509 Iron deficiency anemia, unspecified: Secondary | ICD-10-CM | POA: Diagnosis not present

## 2017-08-25 DIAGNOSIS — N2581 Secondary hyperparathyroidism of renal origin: Secondary | ICD-10-CM | POA: Diagnosis not present

## 2017-08-25 DIAGNOSIS — D631 Anemia in chronic kidney disease: Secondary | ICD-10-CM | POA: Diagnosis not present

## 2017-08-25 DIAGNOSIS — N186 End stage renal disease: Secondary | ICD-10-CM | POA: Diagnosis not present

## 2017-08-27 DIAGNOSIS — D509 Iron deficiency anemia, unspecified: Secondary | ICD-10-CM | POA: Diagnosis not present

## 2017-08-27 DIAGNOSIS — D631 Anemia in chronic kidney disease: Secondary | ICD-10-CM | POA: Diagnosis not present

## 2017-08-27 DIAGNOSIS — N2581 Secondary hyperparathyroidism of renal origin: Secondary | ICD-10-CM | POA: Diagnosis not present

## 2017-08-27 DIAGNOSIS — N186 End stage renal disease: Secondary | ICD-10-CM | POA: Diagnosis not present

## 2017-08-29 DIAGNOSIS — Z992 Dependence on renal dialysis: Secondary | ICD-10-CM | POA: Diagnosis not present

## 2017-08-29 DIAGNOSIS — D631 Anemia in chronic kidney disease: Secondary | ICD-10-CM | POA: Diagnosis not present

## 2017-08-29 DIAGNOSIS — N186 End stage renal disease: Secondary | ICD-10-CM | POA: Diagnosis not present

## 2017-08-29 DIAGNOSIS — N2581 Secondary hyperparathyroidism of renal origin: Secondary | ICD-10-CM | POA: Diagnosis not present

## 2017-08-29 DIAGNOSIS — I120 Hypertensive chronic kidney disease with stage 5 chronic kidney disease or end stage renal disease: Secondary | ICD-10-CM | POA: Diagnosis not present

## 2017-08-29 DIAGNOSIS — D509 Iron deficiency anemia, unspecified: Secondary | ICD-10-CM | POA: Diagnosis not present

## 2017-09-01 DIAGNOSIS — N2581 Secondary hyperparathyroidism of renal origin: Secondary | ICD-10-CM | POA: Diagnosis not present

## 2017-09-01 DIAGNOSIS — N186 End stage renal disease: Secondary | ICD-10-CM | POA: Diagnosis not present

## 2017-09-01 DIAGNOSIS — D631 Anemia in chronic kidney disease: Secondary | ICD-10-CM | POA: Diagnosis not present

## 2017-09-01 DIAGNOSIS — D509 Iron deficiency anemia, unspecified: Secondary | ICD-10-CM | POA: Diagnosis not present

## 2017-09-03 DIAGNOSIS — D509 Iron deficiency anemia, unspecified: Secondary | ICD-10-CM | POA: Diagnosis not present

## 2017-09-03 DIAGNOSIS — N2581 Secondary hyperparathyroidism of renal origin: Secondary | ICD-10-CM | POA: Diagnosis not present

## 2017-09-03 DIAGNOSIS — D631 Anemia in chronic kidney disease: Secondary | ICD-10-CM | POA: Diagnosis not present

## 2017-09-03 DIAGNOSIS — N186 End stage renal disease: Secondary | ICD-10-CM | POA: Diagnosis not present

## 2017-09-04 DIAGNOSIS — E039 Hypothyroidism, unspecified: Secondary | ICD-10-CM | POA: Diagnosis not present

## 2017-09-04 DIAGNOSIS — I509 Heart failure, unspecified: Secondary | ICD-10-CM | POA: Diagnosis not present

## 2017-09-04 DIAGNOSIS — E782 Mixed hyperlipidemia: Secondary | ICD-10-CM | POA: Diagnosis not present

## 2017-09-04 DIAGNOSIS — E119 Type 2 diabetes mellitus without complications: Secondary | ICD-10-CM | POA: Diagnosis not present

## 2017-09-05 DIAGNOSIS — D509 Iron deficiency anemia, unspecified: Secondary | ICD-10-CM | POA: Diagnosis not present

## 2017-09-05 DIAGNOSIS — N2581 Secondary hyperparathyroidism of renal origin: Secondary | ICD-10-CM | POA: Diagnosis not present

## 2017-09-05 DIAGNOSIS — N186 End stage renal disease: Secondary | ICD-10-CM | POA: Diagnosis not present

## 2017-09-05 DIAGNOSIS — D631 Anemia in chronic kidney disease: Secondary | ICD-10-CM | POA: Diagnosis not present

## 2017-09-08 DIAGNOSIS — D631 Anemia in chronic kidney disease: Secondary | ICD-10-CM | POA: Diagnosis not present

## 2017-09-08 DIAGNOSIS — D509 Iron deficiency anemia, unspecified: Secondary | ICD-10-CM | POA: Diagnosis not present

## 2017-09-08 DIAGNOSIS — N186 End stage renal disease: Secondary | ICD-10-CM | POA: Diagnosis not present

## 2017-09-08 DIAGNOSIS — N2581 Secondary hyperparathyroidism of renal origin: Secondary | ICD-10-CM | POA: Diagnosis not present

## 2017-09-11 DIAGNOSIS — N186 End stage renal disease: Secondary | ICD-10-CM | POA: Diagnosis not present

## 2017-09-11 DIAGNOSIS — D509 Iron deficiency anemia, unspecified: Secondary | ICD-10-CM | POA: Diagnosis not present

## 2017-09-11 DIAGNOSIS — N2581 Secondary hyperparathyroidism of renal origin: Secondary | ICD-10-CM | POA: Diagnosis not present

## 2017-09-11 DIAGNOSIS — D631 Anemia in chronic kidney disease: Secondary | ICD-10-CM | POA: Diagnosis not present

## 2017-09-12 DIAGNOSIS — N2581 Secondary hyperparathyroidism of renal origin: Secondary | ICD-10-CM | POA: Diagnosis not present

## 2017-09-12 DIAGNOSIS — D509 Iron deficiency anemia, unspecified: Secondary | ICD-10-CM | POA: Diagnosis not present

## 2017-09-12 DIAGNOSIS — D631 Anemia in chronic kidney disease: Secondary | ICD-10-CM | POA: Diagnosis not present

## 2017-09-12 DIAGNOSIS — N186 End stage renal disease: Secondary | ICD-10-CM | POA: Diagnosis not present

## 2017-09-16 DIAGNOSIS — N186 End stage renal disease: Secondary | ICD-10-CM | POA: Diagnosis not present

## 2017-09-16 DIAGNOSIS — Z992 Dependence on renal dialysis: Secondary | ICD-10-CM | POA: Diagnosis not present

## 2017-09-17 DIAGNOSIS — N186 End stage renal disease: Secondary | ICD-10-CM | POA: Diagnosis not present

## 2017-09-17 DIAGNOSIS — Z992 Dependence on renal dialysis: Secondary | ICD-10-CM | POA: Diagnosis not present

## 2017-09-17 DIAGNOSIS — Z4931 Encounter for adequacy testing for hemodialysis: Secondary | ICD-10-CM | POA: Diagnosis not present

## 2017-09-19 DIAGNOSIS — N186 End stage renal disease: Secondary | ICD-10-CM | POA: Diagnosis not present

## 2017-09-19 DIAGNOSIS — Z992 Dependence on renal dialysis: Secondary | ICD-10-CM | POA: Diagnosis not present

## 2017-09-21 ENCOUNTER — Other Ambulatory Visit: Payer: Self-pay | Admitting: Internal Medicine

## 2017-09-22 ENCOUNTER — Other Ambulatory Visit: Payer: Self-pay | Admitting: Internal Medicine

## 2017-09-22 DIAGNOSIS — Z992 Dependence on renal dialysis: Secondary | ICD-10-CM | POA: Diagnosis not present

## 2017-09-22 DIAGNOSIS — N186 End stage renal disease: Secondary | ICD-10-CM | POA: Diagnosis not present

## 2017-09-24 DIAGNOSIS — Z992 Dependence on renal dialysis: Secondary | ICD-10-CM | POA: Diagnosis not present

## 2017-09-24 DIAGNOSIS — N186 End stage renal disease: Secondary | ICD-10-CM | POA: Diagnosis not present

## 2017-09-26 DIAGNOSIS — Z992 Dependence on renal dialysis: Secondary | ICD-10-CM | POA: Diagnosis not present

## 2017-09-26 DIAGNOSIS — N186 End stage renal disease: Secondary | ICD-10-CM | POA: Diagnosis not present

## 2017-09-29 DIAGNOSIS — N186 End stage renal disease: Secondary | ICD-10-CM | POA: Diagnosis not present

## 2017-09-29 DIAGNOSIS — Z992 Dependence on renal dialysis: Secondary | ICD-10-CM | POA: Diagnosis not present

## 2017-10-01 DIAGNOSIS — N186 End stage renal disease: Secondary | ICD-10-CM | POA: Diagnosis not present

## 2017-10-01 DIAGNOSIS — Z992 Dependence on renal dialysis: Secondary | ICD-10-CM | POA: Diagnosis not present

## 2017-10-03 DIAGNOSIS — E1129 Type 2 diabetes mellitus with other diabetic kidney complication: Secondary | ICD-10-CM | POA: Diagnosis not present

## 2017-10-03 DIAGNOSIS — N186 End stage renal disease: Secondary | ICD-10-CM | POA: Diagnosis not present

## 2017-10-03 DIAGNOSIS — D631 Anemia in chronic kidney disease: Secondary | ICD-10-CM | POA: Diagnosis not present

## 2017-10-03 DIAGNOSIS — E1121 Type 2 diabetes mellitus with diabetic nephropathy: Secondary | ICD-10-CM | POA: Diagnosis not present

## 2017-10-03 DIAGNOSIS — D509 Iron deficiency anemia, unspecified: Secondary | ICD-10-CM | POA: Diagnosis not present

## 2017-10-03 DIAGNOSIS — N2581 Secondary hyperparathyroidism of renal origin: Secondary | ICD-10-CM | POA: Diagnosis not present

## 2017-10-03 DIAGNOSIS — Z992 Dependence on renal dialysis: Secondary | ICD-10-CM | POA: Diagnosis not present

## 2017-10-06 DIAGNOSIS — N2581 Secondary hyperparathyroidism of renal origin: Secondary | ICD-10-CM | POA: Diagnosis not present

## 2017-10-06 DIAGNOSIS — N186 End stage renal disease: Secondary | ICD-10-CM | POA: Diagnosis not present

## 2017-10-06 DIAGNOSIS — E1121 Type 2 diabetes mellitus with diabetic nephropathy: Secondary | ICD-10-CM | POA: Diagnosis not present

## 2017-10-06 DIAGNOSIS — D631 Anemia in chronic kidney disease: Secondary | ICD-10-CM | POA: Diagnosis not present

## 2017-10-06 DIAGNOSIS — D509 Iron deficiency anemia, unspecified: Secondary | ICD-10-CM | POA: Diagnosis not present

## 2017-10-08 DIAGNOSIS — E1121 Type 2 diabetes mellitus with diabetic nephropathy: Secondary | ICD-10-CM | POA: Diagnosis not present

## 2017-10-08 DIAGNOSIS — D631 Anemia in chronic kidney disease: Secondary | ICD-10-CM | POA: Diagnosis not present

## 2017-10-08 DIAGNOSIS — N186 End stage renal disease: Secondary | ICD-10-CM | POA: Diagnosis not present

## 2017-10-08 DIAGNOSIS — D509 Iron deficiency anemia, unspecified: Secondary | ICD-10-CM | POA: Diagnosis not present

## 2017-10-08 DIAGNOSIS — N2581 Secondary hyperparathyroidism of renal origin: Secondary | ICD-10-CM | POA: Diagnosis not present

## 2017-10-10 DIAGNOSIS — N186 End stage renal disease: Secondary | ICD-10-CM | POA: Diagnosis not present

## 2017-10-10 DIAGNOSIS — N2581 Secondary hyperparathyroidism of renal origin: Secondary | ICD-10-CM | POA: Diagnosis not present

## 2017-10-10 DIAGNOSIS — E1121 Type 2 diabetes mellitus with diabetic nephropathy: Secondary | ICD-10-CM | POA: Diagnosis not present

## 2017-10-10 DIAGNOSIS — D631 Anemia in chronic kidney disease: Secondary | ICD-10-CM | POA: Diagnosis not present

## 2017-10-10 DIAGNOSIS — D509 Iron deficiency anemia, unspecified: Secondary | ICD-10-CM | POA: Diagnosis not present

## 2017-10-13 DIAGNOSIS — N186 End stage renal disease: Secondary | ICD-10-CM | POA: Diagnosis not present

## 2017-10-13 DIAGNOSIS — N2581 Secondary hyperparathyroidism of renal origin: Secondary | ICD-10-CM | POA: Diagnosis not present

## 2017-10-13 DIAGNOSIS — D509 Iron deficiency anemia, unspecified: Secondary | ICD-10-CM | POA: Diagnosis not present

## 2017-10-13 DIAGNOSIS — D631 Anemia in chronic kidney disease: Secondary | ICD-10-CM | POA: Diagnosis not present

## 2017-10-13 DIAGNOSIS — E1121 Type 2 diabetes mellitus with diabetic nephropathy: Secondary | ICD-10-CM | POA: Diagnosis not present

## 2017-10-15 DIAGNOSIS — N2581 Secondary hyperparathyroidism of renal origin: Secondary | ICD-10-CM | POA: Diagnosis not present

## 2017-10-15 DIAGNOSIS — D631 Anemia in chronic kidney disease: Secondary | ICD-10-CM | POA: Diagnosis not present

## 2017-10-15 DIAGNOSIS — N186 End stage renal disease: Secondary | ICD-10-CM | POA: Diagnosis not present

## 2017-10-15 DIAGNOSIS — D509 Iron deficiency anemia, unspecified: Secondary | ICD-10-CM | POA: Diagnosis not present

## 2017-10-15 DIAGNOSIS — E1121 Type 2 diabetes mellitus with diabetic nephropathy: Secondary | ICD-10-CM | POA: Diagnosis not present

## 2017-10-16 ENCOUNTER — Other Ambulatory Visit: Payer: Self-pay | Admitting: Internal Medicine

## 2017-10-16 DIAGNOSIS — N186 End stage renal disease: Secondary | ICD-10-CM | POA: Diagnosis not present

## 2017-10-16 DIAGNOSIS — Z992 Dependence on renal dialysis: Secondary | ICD-10-CM | POA: Diagnosis not present

## 2017-10-16 DIAGNOSIS — E1129 Type 2 diabetes mellitus with other diabetic kidney complication: Secondary | ICD-10-CM | POA: Diagnosis not present

## 2017-10-17 DIAGNOSIS — N2581 Secondary hyperparathyroidism of renal origin: Secondary | ICD-10-CM | POA: Diagnosis not present

## 2017-10-17 DIAGNOSIS — N186 End stage renal disease: Secondary | ICD-10-CM | POA: Diagnosis not present

## 2017-10-17 DIAGNOSIS — D509 Iron deficiency anemia, unspecified: Secondary | ICD-10-CM | POA: Diagnosis not present

## 2017-10-17 DIAGNOSIS — E1121 Type 2 diabetes mellitus with diabetic nephropathy: Secondary | ICD-10-CM | POA: Diagnosis not present

## 2017-10-17 DIAGNOSIS — D631 Anemia in chronic kidney disease: Secondary | ICD-10-CM | POA: Diagnosis not present

## 2017-10-20 DIAGNOSIS — E1121 Type 2 diabetes mellitus with diabetic nephropathy: Secondary | ICD-10-CM | POA: Diagnosis not present

## 2017-10-20 DIAGNOSIS — D509 Iron deficiency anemia, unspecified: Secondary | ICD-10-CM | POA: Diagnosis not present

## 2017-10-20 DIAGNOSIS — N186 End stage renal disease: Secondary | ICD-10-CM | POA: Diagnosis not present

## 2017-10-20 DIAGNOSIS — N2581 Secondary hyperparathyroidism of renal origin: Secondary | ICD-10-CM | POA: Diagnosis not present

## 2017-10-20 DIAGNOSIS — D631 Anemia in chronic kidney disease: Secondary | ICD-10-CM | POA: Diagnosis not present

## 2017-10-21 DIAGNOSIS — N2581 Secondary hyperparathyroidism of renal origin: Secondary | ICD-10-CM | POA: Diagnosis not present

## 2017-10-21 DIAGNOSIS — D509 Iron deficiency anemia, unspecified: Secondary | ICD-10-CM | POA: Diagnosis not present

## 2017-10-21 DIAGNOSIS — E1121 Type 2 diabetes mellitus with diabetic nephropathy: Secondary | ICD-10-CM | POA: Diagnosis not present

## 2017-10-21 DIAGNOSIS — N186 End stage renal disease: Secondary | ICD-10-CM | POA: Diagnosis not present

## 2017-10-21 DIAGNOSIS — D631 Anemia in chronic kidney disease: Secondary | ICD-10-CM | POA: Diagnosis not present

## 2017-10-22 DIAGNOSIS — N2581 Secondary hyperparathyroidism of renal origin: Secondary | ICD-10-CM | POA: Diagnosis not present

## 2017-10-22 DIAGNOSIS — D631 Anemia in chronic kidney disease: Secondary | ICD-10-CM | POA: Diagnosis not present

## 2017-10-22 DIAGNOSIS — E1121 Type 2 diabetes mellitus with diabetic nephropathy: Secondary | ICD-10-CM | POA: Diagnosis not present

## 2017-10-22 DIAGNOSIS — N186 End stage renal disease: Secondary | ICD-10-CM | POA: Diagnosis not present

## 2017-10-22 DIAGNOSIS — D509 Iron deficiency anemia, unspecified: Secondary | ICD-10-CM | POA: Diagnosis not present

## 2017-10-24 DIAGNOSIS — E1121 Type 2 diabetes mellitus with diabetic nephropathy: Secondary | ICD-10-CM | POA: Diagnosis not present

## 2017-10-24 DIAGNOSIS — D509 Iron deficiency anemia, unspecified: Secondary | ICD-10-CM | POA: Diagnosis not present

## 2017-10-24 DIAGNOSIS — N186 End stage renal disease: Secondary | ICD-10-CM | POA: Diagnosis not present

## 2017-10-24 DIAGNOSIS — D631 Anemia in chronic kidney disease: Secondary | ICD-10-CM | POA: Diagnosis not present

## 2017-10-24 DIAGNOSIS — N2581 Secondary hyperparathyroidism of renal origin: Secondary | ICD-10-CM | POA: Diagnosis not present

## 2017-10-27 DIAGNOSIS — N186 End stage renal disease: Secondary | ICD-10-CM | POA: Diagnosis not present

## 2017-10-27 DIAGNOSIS — D509 Iron deficiency anemia, unspecified: Secondary | ICD-10-CM | POA: Diagnosis not present

## 2017-10-27 DIAGNOSIS — N2581 Secondary hyperparathyroidism of renal origin: Secondary | ICD-10-CM | POA: Diagnosis not present

## 2017-10-27 DIAGNOSIS — E1121 Type 2 diabetes mellitus with diabetic nephropathy: Secondary | ICD-10-CM | POA: Diagnosis not present

## 2017-10-27 DIAGNOSIS — D631 Anemia in chronic kidney disease: Secondary | ICD-10-CM | POA: Diagnosis not present

## 2017-10-29 DIAGNOSIS — N186 End stage renal disease: Secondary | ICD-10-CM | POA: Diagnosis not present

## 2017-10-29 DIAGNOSIS — E1121 Type 2 diabetes mellitus with diabetic nephropathy: Secondary | ICD-10-CM | POA: Diagnosis not present

## 2017-10-29 DIAGNOSIS — D631 Anemia in chronic kidney disease: Secondary | ICD-10-CM | POA: Diagnosis not present

## 2017-10-29 DIAGNOSIS — N2581 Secondary hyperparathyroidism of renal origin: Secondary | ICD-10-CM | POA: Diagnosis not present

## 2017-10-29 DIAGNOSIS — D509 Iron deficiency anemia, unspecified: Secondary | ICD-10-CM | POA: Diagnosis not present

## 2017-10-29 NOTE — Progress Notes (Signed)
Subjective:    Patient ID: [name], female    DOB: 1929/03/12, 84 y.o.   MRN: [id number]  HPI The patient is here for follow up.     She is currently living with her daughter.  She just returned from [place] 3 weeks ago, where she was for an extended visit with her other child.  She did have a CHF exacerbation and was in the hospital and some of her medications were changed.  Cough and sounds congested in chest:  She has had increased cough x 3-5 days. The cough is dry, but it sounds like she has congestion in her chest.  No wheeze.  No obvious SOB.  Wears oxygen.  She denies any changes in appetite-it is poor to begin with.  There have been no fevers or chills.   Her stomach has been irriatated - vomiting, fecal incontinence.  Her daughter feels that this may be related to her metoprolol.  This is a new medication and she wonders if we can change it.  Hypertension, CHF, Paroxysmal atrial fibrillation: She is taking her medication daily, except for the imdur which she is not currently taking. She is compliant with a low sodium diet.  She denies chest pain, palpitations, shortness of breath. She is not exercising regularly.  She does not monitor her blood pressure at home.    ESRD:  She does HD on MWF.  Diabetes: She is taking her medication daily as prescribed. She is compliant with a diabetic diet. She is not exercising regularly.    Hypothyroidism:  She is taking her medication daily.  She denies any recent changes in energy or weight that are unexplained.   Hyperlipidemia: She is taking her medication daily. She is compliant with a low fat/cholesterol diet. She is not exercising regularly.     Medications and allergies reviewed with patient and updated if appropriate.  Patient Active Problem List   Diagnosis Date Noted  . Wound healing, delayed with exposed bone 11/08/2016  . Leg fracture 08/13/2016  . Hypothyroidism 08/30/2015  . Labile blood glucose   . Memory loss    . Hip fracture ([place]) 07/12/2015  . Swelling   . ESRD on dialysis ([place])   . PAF (paroxysmal atrial fibrillation) ([place])   . Chronic combined systolic and diastolic heart failure EF 45-50% by echo 06/25/2012 07/30/2012  . Breast cancer, s/p Lt mastectomy 07/22/2012  . Pleural effusion due to CHF (congestive heart failure) ([place]) 07/20/2012  . Hypotension, unspecified 06/25/2012  . Right tibial fracture 06/21/2012  . Metabolic bone disease 40/98/1191  . Secondary hyperparathyroidism of renal origin ([place]) 06/19/2012  . Nonunion of fracture, R tibia 06/17/2012  . H/O radioactive iodine thyroid ablation   . Closed fracture of right fibula and tibia 07/10/2011  . HTN (hypertension) 07/10/2011  . DM2 (diabetes mellitus, type 2) ([place]) 07/10/2011  . Hyperlipidemia 07/10/2011  . Anemia 07/10/2011    Current Outpatient Medications on File Prior to Visit  Medication Sig Dispense Refill  . acetaminophen (TYLENOL) 325 MG tablet Take 2 tablets (650 mg total) by mouth every 6 (six) hours as needed for mild pain or moderate pain.    Marland Kitchen aspirin EC 325 MG tablet Take 1 tablet (325 mg total) by mouth daily. For 30 days post op for DVT Prophylaxis.  Resume 81 mg Aspirin after this has been completed. 30 tablet 0  . b complex-vitamin c-folic acid (NEPHRO-VITE) 0.8 MG TABS tablet Take 1 tablet by mouth at bedtime. [place]  tablet 5  . BYSTOLIC 10 MG tablet TAKE 1 TABLET BY MOUTH ONCE DAILY FOR HYPERTENSION 30 tablet 5  . Cholecalciferol (VITAMIN D-3 PO) Take 1 capsule by mouth daily.    . darbepoetin (ARANESP) 100 MCG/0.5ML SOLN Inject 0.5 mLs (100 mcg total) into the vein every Wednesday with hemodialysis. 4.2 mL 6  . diclofenac sodium (VOLTAREN) 1 % GEL APPLY 2 GRAMS TOPICALLY EVERY SHIFT FOR PAIN 100 g 1  . glipiZIDE (GLUCOTROL) 5 MG tablet Take 1 tablet by mouth 2 (two) times daily.  0  . isosorbide mononitrate (IMDUR) 30 MG 24 hr tablet Take 1 tablet (30 mg total) by mouth daily. 30 tablet 1  . levothyroxine  (SYNTHROID, LEVOTHROID) 75 MCG tablet TAKE 1 TABLET BY MOUTH ONCE DAILY FOR HYPOTHYROIDISM 30 tablet 5  . methocarbamol (ROBAXIN) 500 MG tablet Take 1 tablet (500 mg total) by mouth every 6 (six) hours as needed for muscle spasms.    . metoprolol tartrate (LOPRESSOR) 25 MG tablet Take 1 tablet by mouth 2 (two) times daily.  0  . Nutritional Supplements (FEEDING SUPPLEMENT, NEPRO CARB STEADY,) LIQD Take 237 mLs by mouth 3 (three) times daily as needed (Supplement).  0  . omeprazole (PRILOSEC) 20 MG capsule TAKE ONE CAPSULE BY MOUTH IN THE EVENING FOR GERD 30 capsule 5  . pravastatin (PRAVACHOL) 20 MG tablet Take 1 tablet (20 mg total) by mouth daily. -- Office visit needed for further refills 90 tablet 0  . sevelamer carbonate (RENVELA) 800 MG tablet TAKE 1 TABLET BY MOUTH DAILY WITH A MEAL 90 tablet 0  . vitamin E 200 UNIT capsule Take 200 Units by mouth daily.     No current facility-administered medications on file prior to visit.     Past Medical History:  Diagnosis Date  . Arthritis   . Atrial fibrillation ([place]) 06/25/2012   2D Echo - EF 45-50, left atrium severely dilated, mitral valve heavily calcified annulus with calcification of the subvalvular apparatus - no longer on Coumadin  . Breast cancer ([place])   . CHF (congestive heart failure) ([place])   . DM2 (diabetes mellitus, type 2) ([place])   . ESRD (end stage renal disease) ([place])    HD MWF - [facility]  . HTN (hypertension)   . Hyperlipidemia   . Hyperparathyroidism, secondary renal ([place])   . Hypothyroid   . Shortness of breath   . Tibia/fibula fracture 07/10/2011   right - nonunion repair 06/2012    Past Surgical History:  Procedure Laterality Date  . ABDOMINAL SURGERY    . ANKLE FUSION Right   . ARTERIOVENOUS GRAFT PLACEMENT Right   . BREAST SURGERY Left    Mastectomy  . EYE SURGERY Bilateral    Cataract  . FEMUR IM NAIL Right 08/15/2016   Procedure: INTRAMEDULLARY (IM) RETROGRADE FEMORAL NAILING;  Surgeon: Renette Butters,  MD;  Location: [facility];  Service: Orthopedics;  Laterality: Right;  . MASTECTOMY    . ORIF TIBIA FRACTURE Right 06/16/2012   Procedure: OPEN REDUCTION INTERNAL FIXATION (ORIF) TIBIA FRACTURE;  Surgeon: Rozanna Box, MD;  Location: [facility];  Service: Orthopedics;  Laterality: Right;  . THYROID SURGERY      Social History   Socioeconomic History  . Marital status: Single    Spouse name: Not on file  . Number of children: Not on file  . Years of education: Not on file  . Highest education level: Not on file  Occupational History  . Not on file  Social  Needs  . Financial resource strain: Not on file  . Food insecurity:    Worry: Not on file    Inability: Not on file  . Transportation needs:    Medical: Not on file    Non-medical: Not on file  Tobacco Use  . Smoking status: Never Smoker  . Smokeless tobacco: Current User    Types: Snuff  Substance and Sexual Activity  . Alcohol use: No  . Drug use: No  . Sexual activity: Never    Birth control/protection: Post-menopausal  Lifestyle  . Physical activity:    Days per week: Not on file    Minutes per session: Not on file  . Stress: Not on file  Relationships  . Social connections:    Talks on phone: Not on file    Gets together: Not on file    Attends religious service: Not on file    Active member of club or organization: Not on file    Attends meetings of clubs or organizations: Not on file    Relationship status: Not on file  Other Topics Concern  . Not on file  Social History Narrative  . Not on file    Family History  Problem Relation Age of Onset  . Hyperlipidemia Father   . Diabetes Father   . Breast cancer Sister     Review of Systems  Constitutional: Positive for appetite change (low). Negative for chills and fever.  Respiratory: Positive for cough (chest congestion). Negative for chest tightness, shortness of breath and wheezing.   Cardiovascular: Positive for leg swelling (mild). Negative for chest pain  and palpitations.  Gastrointestinal: Positive for abdominal pain, nausea (sometimes) and vomiting (in between meals). Negative for blood in stool, constipation and diarrhea.       No gerd  Musculoskeletal: Positive for gait problem (fallen twice).  Neurological: Positive for dizziness, light-headedness and headaches (often).       Objective:   Vitals:   10/30/17 0834  BP: (!) 150/70  Pulse: 68  SpO2: 97%   BP Readings from Last 3 Encounters:  10/30/17 (!) 150/70  11/07/16 (!) 183/63  09/24/16 (!) 154/82   Wt Readings from Last 3 Encounters:  10/30/17 117 lb (53.1 kg)  12/05/16 131 lb (59.4 kg)  11/11/16 130 lb (59 kg)   Body mass index is 22.11 kg/m.   Physical Exam    Constitutional: Appears well-developed and well-nourished. No distress.  Wearing oxygen HENT:  Head: Normocephalic and atraumatic.  Neck: Neck supple. No tracheal deviation present. No thyromegaly present.  No cervical lymphadenopathy Cardiovascular: Normal rate, regular rhythm and normal heart sounds.   3/6 systolic murmur heard. No carotid bruit .  1 + edema RLE Pulmonary/Chest: Effort normal and breath sounds normal. No respiratory distress. No has no wheezes. No rales. Abdomen: Soft, nontender, nondistended Skin: Skin is warm and dry. Not diaphoretic.  Psychiatric: Normal mood and affect. Behavior is normal.      Assessment & Plan:    See Problem List for Assessment and Plan of chronic medical problems.

## 2017-10-29 NOTE — Patient Instructions (Addendum)
  Test(s) ordered today. Your results will be released to [name] (or called to you) after review, usually within 72hours after test completion. If any changes need to be made, you will be notified at that same time.   Medications reviewed and updated.  Changes include  -   Starting doxycycline for the cough Stopping metoprolol and starting bystolic  Your prescription(s) have been submitted to your pharmacy. Please take as directed and contact our office if you believe you are having problem(s) with the medication(s).  Please followup in 6 months

## 2017-10-30 ENCOUNTER — Ambulatory Visit (INDEPENDENT_AMBULATORY_CARE_PROVIDER_SITE_OTHER): Payer: Medicare Other | Admitting: Internal Medicine

## 2017-10-30 ENCOUNTER — Ambulatory Visit (INDEPENDENT_AMBULATORY_CARE_PROVIDER_SITE_OTHER)
Admission: RE | Admit: 2017-10-30 | Discharge: 2017-10-30 | Disposition: A | Discharge status: Home / Self Care | Payer: Medicare Other | Source: Ambulatory Visit | Attending: Internal Medicine | Admitting: Internal Medicine

## 2017-10-30 ENCOUNTER — Encounter: Payer: Self-pay | Admitting: Internal Medicine

## 2017-10-30 ENCOUNTER — Other Ambulatory Visit (INDEPENDENT_AMBULATORY_CARE_PROVIDER_SITE_OTHER): Payer: Medicare Other

## 2017-10-30 ENCOUNTER — Other Ambulatory Visit: Payer: Self-pay | Admitting: Emergency Medicine

## 2017-10-30 ENCOUNTER — Other Ambulatory Visit: Payer: Self-pay | Admitting: Internal Medicine

## 2017-10-30 VITALS — BP 150/70 | HR 68 | Ht 61.0 in | Wt 117.0 lb

## 2017-10-30 DIAGNOSIS — R05 Cough: Secondary | ICD-10-CM | POA: Diagnosis not present

## 2017-10-30 DIAGNOSIS — R059 Cough, unspecified: Secondary | ICD-10-CM | POA: Insufficient documentation

## 2017-10-30 DIAGNOSIS — R296 Repeated falls: Secondary | ICD-10-CM | POA: Insufficient documentation

## 2017-10-30 DIAGNOSIS — R5381 Other malaise: Secondary | ICD-10-CM

## 2017-10-30 DIAGNOSIS — E038 Other specified hypothyroidism: Secondary | ICD-10-CM

## 2017-10-30 DIAGNOSIS — E782 Mixed hyperlipidemia: Secondary | ICD-10-CM

## 2017-10-30 DIAGNOSIS — Z992 Dependence on renal dialysis: Secondary | ICD-10-CM

## 2017-10-30 DIAGNOSIS — E1122 Type 2 diabetes mellitus with diabetic chronic kidney disease: Secondary | ICD-10-CM

## 2017-10-30 DIAGNOSIS — N184 Chronic kidney disease, stage 4 (severe): Secondary | ICD-10-CM | POA: Diagnosis not present

## 2017-10-30 DIAGNOSIS — N186 End stage renal disease: Secondary | ICD-10-CM

## 2017-10-30 DIAGNOSIS — R2689 Other abnormalities of gait and mobility: Secondary | ICD-10-CM | POA: Insufficient documentation

## 2017-10-30 DIAGNOSIS — I480 Paroxysmal atrial fibrillation: Secondary | ICD-10-CM | POA: Diagnosis not present

## 2017-10-30 DIAGNOSIS — I10 Essential (primary) hypertension: Secondary | ICD-10-CM

## 2017-10-30 DIAGNOSIS — I5042 Chronic combined systolic (congestive) and diastolic (congestive) heart failure: Secondary | ICD-10-CM | POA: Diagnosis not present

## 2017-10-30 LAB — COMPREHENSIVE METABOLIC PANEL
ALBUMIN: 3 g/dL — AB (ref 3.5–5.2)
ALT: 6 U/L (ref 0–35)
AST: 17 U/L (ref 0–37)
Alkaline Phosphatase: 155 U/L — ABNORMAL HIGH (ref 39–117)
BUN: 22 mg/dL (ref 6–23)
CHLORIDE: 100 meq/L (ref 96–112)
CO2: 30 meq/L (ref 19–32)
Calcium: 8.2 mg/dL — ABNORMAL LOW (ref 8.4–10.5)
Creatinine, Ser: 4.17 mg/dL — ABNORMAL HIGH (ref 0.40–1.20)
GFR: 12.98 mL/min — CL (ref >60.00)
Glucose, Bld: 88 mg/dL (ref 70–99)
POTASSIUM: 2.5 meq/L — AB (ref 3.5–5.1)
SODIUM: 140 meq/L (ref 135–145)
Total Bilirubin: 0.7 mg/dL (ref 0.2–1.2)
Total Protein: 6.2 g/dL (ref 6.0–8.3)

## 2017-10-30 LAB — LIPID PANEL
CHOL/HDL RATIO: 2
Cholesterol: 137 mg/dL (ref 0–200)
HDL: 77.4 mg/dL (ref >39.00)
LDL CALC: 47 mg/dL (ref 0–99)
NONHDL: 59.99
TRIGLYCERIDES: 67 mg/dL (ref 0.0–149.0)
VLDL: 13.4 mg/dL (ref 0.0–40.0)

## 2017-10-30 LAB — CBC WITH DIFFERENTIAL/PLATELET
BASOS ABS: 0.1 10*3/uL (ref 0.0–0.1)
Basophils Relative: 1 % (ref 0.0–3.0)
EOS ABS: 0.1 10*3/uL (ref 0.0–0.7)
Eosinophils Relative: 1.2 % (ref 0.0–5.0)
HCT: 28.5 % — ABNORMAL LOW (ref 36.0–46.0)
Hemoglobin: 9.3 g/dL — ABNORMAL LOW (ref 12.0–15.0)
LYMPHS ABS: 0.4 10*3/uL — AB (ref 0.7–4.0)
Lymphocytes Relative: 6.7 % — ABNORMAL LOW (ref 12.0–46.0)
MCHC: 32.5 g/dL (ref 30.0–36.0)
MCV: 91.4 fl (ref 78.0–100.0)
MONO ABS: 0.8 10*3/uL (ref 0.1–1.0)
Monocytes Relative: 12.8 % — ABNORMAL HIGH (ref 3.0–12.0)
NEUTROS ABS: 5.1 10*3/uL (ref 1.4–7.7)
NEUTROS PCT: 78.3 % — AB (ref 43.0–77.0)
PLATELETS: 339 10*3/uL (ref 150.0–400.0)
RBC: 3.12 Mil/uL — AB (ref 3.87–5.11)
RDW: 16.6 % — ABNORMAL HIGH (ref 11.5–15.5)
WBC: 6.6 10*3/uL (ref 4.0–10.5)

## 2017-10-30 LAB — HEMOGLOBIN A1C: HEMOGLOBIN A1C: 5.1 % (ref 4.6–6.5)

## 2017-10-30 LAB — TSH: TSH: 2.11 u[IU]/mL (ref 0.35–4.50)

## 2017-10-30 MED ORDER — POTASSIUM CHLORIDE CRYS ER 20 MEQ PO TBCR: 40.0000 meq | EXTENDED_RELEASE_TABLET | Freq: Once | ORAL | 0 refills | Status: DC

## 2017-10-30 MED ORDER — NEBIVOLOL HCL 10 MG PO TABS: 10.0000 mg | ORAL_TABLET | Freq: Every day | ORAL | 1 refills | Status: DC

## 2017-10-30 MED ORDER — DOXYCYCLINE HYCLATE 100 MG PO TABS
100.0000 mg | ORAL_TABLET | Freq: Two times a day (BID) | ORAL | 0 refills | Status: DC
Start: 2017-10-30 — End: 2017-12-08

## 2017-10-30 NOTE — Assessment & Plan Note (Signed)
Expensing cough with chest congestion Concern for developing pneumonia Start doxycycline twice daily x10 days Chest x-ray today Monitor closely

## 2017-10-30 NOTE — Assessment & Plan Note (Signed)
Compliant with a diabetic diet Taking glipizide twice daily Will check A1c Has not been checking her sugars at home

## 2017-10-30 NOTE — Assessment & Plan Note (Signed)
Check lipid panel, TSH, CMP Continue statin

## 2017-10-30 NOTE — Telephone Encounter (Signed)
LVM with pt's daughter with Directions for taking potassium.

## 2017-10-30 NOTE — Telephone Encounter (Signed)
CRITICAL VALUE STICKER  CRITICAL VALUE:[place] 2.5 GFR- 12.98

## 2017-10-30 NOTE — Telephone Encounter (Signed)
Call patient - speak to her daughter or granddaughter - let them know her potassium is very low - I have no recent labs for comparison.  ( rest of labs are not back)  We need her to take potassium today for one dose and then discuss this with kidney doctor tomorrow at hemodialysis - they will give her additional potassium then if needed.    [place] con pending.

## 2017-10-30 NOTE — Assessment & Plan Note (Signed)
Poor balance, physical deconditioning and frequent falls Will refer to home PT

## 2017-10-30 NOTE — Assessment & Plan Note (Addendum)
In sinus rhythm, rate controlled Her daughter would like to switch the metoprolol because she thinks it is causing some side effects She was on Bystolic in the past and her blood pressure was well controlled and her heart rate was controlled We will discontinue metoprolol and restart Bystolic CMP If GI symptoms do not improve can always consider going back on metoprolol

## 2017-10-30 NOTE — Assessment & Plan Note (Signed)
Blood pressure slightly elevated here today We will be discontinuing metoprolol because her daughter is concerned it is causing side effects Restart Bystolic, which she was on last year Currently not taking isosorbide mononitrate Monitor blood pressure at home Blood pressure is not controlled at home will restart isosorbide mononitrate 30 mg daily CMP, CBC

## 2017-10-30 NOTE — Assessment & Plan Note (Signed)
Check TSH Titrate dose if needed

## 2017-10-30 NOTE — Assessment & Plan Note (Addendum)
Has hemodialysis 3 days a week No recent blood work on file CMP, CBC

## 2017-10-30 NOTE — Assessment & Plan Note (Signed)
Lungs are clear and appears to be euvolemic Not on diuretics,, but does hemodialysis 3 times a week Not following with cardiology

## 2017-10-31 DIAGNOSIS — D509 Iron deficiency anemia, unspecified: Secondary | ICD-10-CM | POA: Diagnosis not present

## 2017-10-31 DIAGNOSIS — N2581 Secondary hyperparathyroidism of renal origin: Secondary | ICD-10-CM | POA: Diagnosis not present

## 2017-10-31 DIAGNOSIS — E1121 Type 2 diabetes mellitus with diabetic nephropathy: Secondary | ICD-10-CM | POA: Diagnosis not present

## 2017-10-31 DIAGNOSIS — N186 End stage renal disease: Secondary | ICD-10-CM | POA: Diagnosis not present

## 2017-10-31 DIAGNOSIS — D631 Anemia in chronic kidney disease: Secondary | ICD-10-CM | POA: Diagnosis not present

## 2017-10-31 NOTE — Telephone Encounter (Signed)
Spoke with pts daughter to inform.  

## 2017-11-03 DIAGNOSIS — D509 Iron deficiency anemia, unspecified: Secondary | ICD-10-CM | POA: Diagnosis not present

## 2017-11-03 DIAGNOSIS — N186 End stage renal disease: Secondary | ICD-10-CM | POA: Diagnosis not present

## 2017-11-03 DIAGNOSIS — N2581 Secondary hyperparathyroidism of renal origin: Secondary | ICD-10-CM | POA: Diagnosis not present

## 2017-11-03 DIAGNOSIS — D631 Anemia in chronic kidney disease: Secondary | ICD-10-CM | POA: Diagnosis not present

## 2017-11-03 DIAGNOSIS — E1121 Type 2 diabetes mellitus with diabetic nephropathy: Secondary | ICD-10-CM | POA: Diagnosis not present

## 2017-11-05 DIAGNOSIS — N2581 Secondary hyperparathyroidism of renal origin: Secondary | ICD-10-CM | POA: Diagnosis not present

## 2017-11-05 DIAGNOSIS — E1121 Type 2 diabetes mellitus with diabetic nephropathy: Secondary | ICD-10-CM | POA: Diagnosis not present

## 2017-11-05 DIAGNOSIS — D631 Anemia in chronic kidney disease: Secondary | ICD-10-CM | POA: Diagnosis not present

## 2017-11-05 DIAGNOSIS — D509 Iron deficiency anemia, unspecified: Secondary | ICD-10-CM | POA: Diagnosis not present

## 2017-11-05 DIAGNOSIS — N186 End stage renal disease: Secondary | ICD-10-CM | POA: Diagnosis not present

## 2017-11-06 ENCOUNTER — Telehealth: Payer: Self-pay | Admitting: Internal Medicine

## 2017-11-06 DIAGNOSIS — E1122 Type 2 diabetes mellitus with diabetic chronic kidney disease: Secondary | ICD-10-CM | POA: Diagnosis not present

## 2017-11-06 DIAGNOSIS — M1990 Unspecified osteoarthritis, unspecified site: Secondary | ICD-10-CM | POA: Diagnosis not present

## 2017-11-06 DIAGNOSIS — E785 Hyperlipidemia, unspecified: Secondary | ICD-10-CM | POA: Diagnosis not present

## 2017-11-06 DIAGNOSIS — I132 Hypertensive heart and chronic kidney disease with heart failure and with stage 5 chronic kidney disease, or end stage renal disease: Secondary | ICD-10-CM | POA: Diagnosis not present

## 2017-11-06 DIAGNOSIS — R2689 Other abnormalities of gait and mobility: Secondary | ICD-10-CM | POA: Diagnosis not present

## 2017-11-06 DIAGNOSIS — I5042 Chronic combined systolic (congestive) and diastolic (congestive) heart failure: Secondary | ICD-10-CM | POA: Diagnosis not present

## 2017-11-06 DIAGNOSIS — Z9181 History of falling: Secondary | ICD-10-CM | POA: Diagnosis not present

## 2017-11-06 DIAGNOSIS — D631 Anemia in chronic kidney disease: Secondary | ICD-10-CM | POA: Diagnosis not present

## 2017-11-06 DIAGNOSIS — Z853 Personal history of malignant neoplasm of breast: Secondary | ICD-10-CM | POA: Diagnosis not present

## 2017-11-06 DIAGNOSIS — I482 Chronic atrial fibrillation: Secondary | ICD-10-CM | POA: Diagnosis not present

## 2017-11-06 DIAGNOSIS — E039 Hypothyroidism, unspecified: Secondary | ICD-10-CM | POA: Diagnosis not present

## 2017-11-06 DIAGNOSIS — N186 End stage renal disease: Secondary | ICD-10-CM | POA: Diagnosis not present

## 2017-11-06 DIAGNOSIS — Z992 Dependence on renal dialysis: Secondary | ICD-10-CM | POA: Diagnosis not present

## 2017-11-06 NOTE — Telephone Encounter (Signed)
Copied from [place] [phone]. Topic: General - Other >> Nov 06, 2017 11:57 AM Yvette Rack wrote: Reason for CRM: Samyra with [facility] requests verbal orders for PT 2 times a week for 6 weeks. Cb# [phone]

## 2017-11-06 NOTE — Telephone Encounter (Signed)
Gave verbal orders per [place] 

## 2017-11-07 DIAGNOSIS — N186 End stage renal disease: Secondary | ICD-10-CM | POA: Diagnosis not present

## 2017-11-07 DIAGNOSIS — D509 Iron deficiency anemia, unspecified: Secondary | ICD-10-CM | POA: Diagnosis not present

## 2017-11-07 DIAGNOSIS — D631 Anemia in chronic kidney disease: Secondary | ICD-10-CM | POA: Diagnosis not present

## 2017-11-07 DIAGNOSIS — E1121 Type 2 diabetes mellitus with diabetic nephropathy: Secondary | ICD-10-CM | POA: Diagnosis not present

## 2017-11-07 DIAGNOSIS — N2581 Secondary hyperparathyroidism of renal origin: Secondary | ICD-10-CM | POA: Diagnosis not present

## 2017-11-10 DIAGNOSIS — D631 Anemia in chronic kidney disease: Secondary | ICD-10-CM | POA: Diagnosis not present

## 2017-11-10 DIAGNOSIS — D509 Iron deficiency anemia, unspecified: Secondary | ICD-10-CM | POA: Diagnosis not present

## 2017-11-10 DIAGNOSIS — N2581 Secondary hyperparathyroidism of renal origin: Secondary | ICD-10-CM | POA: Diagnosis not present

## 2017-11-10 DIAGNOSIS — E1121 Type 2 diabetes mellitus with diabetic nephropathy: Secondary | ICD-10-CM | POA: Diagnosis not present

## 2017-11-10 DIAGNOSIS — N186 End stage renal disease: Secondary | ICD-10-CM | POA: Diagnosis not present

## 2017-11-11 DIAGNOSIS — N186 End stage renal disease: Secondary | ICD-10-CM | POA: Diagnosis not present

## 2017-11-11 DIAGNOSIS — I482 Chronic atrial fibrillation: Secondary | ICD-10-CM | POA: Diagnosis not present

## 2017-11-11 DIAGNOSIS — R2689 Other abnormalities of gait and mobility: Secondary | ICD-10-CM | POA: Diagnosis not present

## 2017-11-11 DIAGNOSIS — I5042 Chronic combined systolic (congestive) and diastolic (congestive) heart failure: Secondary | ICD-10-CM | POA: Diagnosis not present

## 2017-11-11 DIAGNOSIS — E1122 Type 2 diabetes mellitus with diabetic chronic kidney disease: Secondary | ICD-10-CM | POA: Diagnosis not present

## 2017-11-11 DIAGNOSIS — I132 Hypertensive heart and chronic kidney disease with heart failure and with stage 5 chronic kidney disease, or end stage renal disease: Secondary | ICD-10-CM | POA: Diagnosis not present

## 2017-11-12 DIAGNOSIS — D631 Anemia in chronic kidney disease: Secondary | ICD-10-CM | POA: Diagnosis not present

## 2017-11-12 DIAGNOSIS — E1121 Type 2 diabetes mellitus with diabetic nephropathy: Secondary | ICD-10-CM | POA: Diagnosis not present

## 2017-11-12 DIAGNOSIS — N186 End stage renal disease: Secondary | ICD-10-CM | POA: Diagnosis not present

## 2017-11-12 DIAGNOSIS — N2581 Secondary hyperparathyroidism of renal origin: Secondary | ICD-10-CM | POA: Diagnosis not present

## 2017-11-12 DIAGNOSIS — D509 Iron deficiency anemia, unspecified: Secondary | ICD-10-CM | POA: Diagnosis not present

## 2017-11-13 DIAGNOSIS — E1122 Type 2 diabetes mellitus with diabetic chronic kidney disease: Secondary | ICD-10-CM | POA: Diagnosis not present

## 2017-11-13 DIAGNOSIS — I482 Chronic atrial fibrillation: Secondary | ICD-10-CM | POA: Diagnosis not present

## 2017-11-13 DIAGNOSIS — I5042 Chronic combined systolic (congestive) and diastolic (congestive) heart failure: Secondary | ICD-10-CM | POA: Diagnosis not present

## 2017-11-13 DIAGNOSIS — I132 Hypertensive heart and chronic kidney disease with heart failure and with stage 5 chronic kidney disease, or end stage renal disease: Secondary | ICD-10-CM | POA: Diagnosis not present

## 2017-11-13 DIAGNOSIS — N186 End stage renal disease: Secondary | ICD-10-CM | POA: Diagnosis not present

## 2017-11-13 DIAGNOSIS — R2689 Other abnormalities of gait and mobility: Secondary | ICD-10-CM | POA: Diagnosis not present

## 2017-11-14 DIAGNOSIS — N186 End stage renal disease: Secondary | ICD-10-CM | POA: Diagnosis not present

## 2017-11-14 DIAGNOSIS — D631 Anemia in chronic kidney disease: Secondary | ICD-10-CM | POA: Diagnosis not present

## 2017-11-14 DIAGNOSIS — D509 Iron deficiency anemia, unspecified: Secondary | ICD-10-CM | POA: Diagnosis not present

## 2017-11-14 DIAGNOSIS — N2581 Secondary hyperparathyroidism of renal origin: Secondary | ICD-10-CM | POA: Diagnosis not present

## 2017-11-14 DIAGNOSIS — E1121 Type 2 diabetes mellitus with diabetic nephropathy: Secondary | ICD-10-CM | POA: Diagnosis not present

## 2017-11-16 DIAGNOSIS — Z992 Dependence on renal dialysis: Secondary | ICD-10-CM | POA: Diagnosis not present

## 2017-11-16 DIAGNOSIS — E1129 Type 2 diabetes mellitus with other diabetic kidney complication: Secondary | ICD-10-CM | POA: Diagnosis not present

## 2017-11-16 DIAGNOSIS — N186 End stage renal disease: Secondary | ICD-10-CM | POA: Diagnosis not present

## 2017-11-17 DIAGNOSIS — D509 Iron deficiency anemia, unspecified: Secondary | ICD-10-CM | POA: Diagnosis not present

## 2017-11-17 DIAGNOSIS — D631 Anemia in chronic kidney disease: Secondary | ICD-10-CM | POA: Diagnosis not present

## 2017-11-17 DIAGNOSIS — N186 End stage renal disease: Secondary | ICD-10-CM | POA: Diagnosis not present

## 2017-11-17 DIAGNOSIS — N2581 Secondary hyperparathyroidism of renal origin: Secondary | ICD-10-CM | POA: Diagnosis not present

## 2017-11-17 DIAGNOSIS — E876 Hypokalemia: Secondary | ICD-10-CM | POA: Diagnosis not present

## 2017-11-18 DIAGNOSIS — N186 End stage renal disease: Secondary | ICD-10-CM | POA: Diagnosis not present

## 2017-11-18 DIAGNOSIS — I482 Chronic atrial fibrillation: Secondary | ICD-10-CM | POA: Diagnosis not present

## 2017-11-18 DIAGNOSIS — R2689 Other abnormalities of gait and mobility: Secondary | ICD-10-CM | POA: Diagnosis not present

## 2017-11-18 DIAGNOSIS — I132 Hypertensive heart and chronic kidney disease with heart failure and with stage 5 chronic kidney disease, or end stage renal disease: Secondary | ICD-10-CM | POA: Diagnosis not present

## 2017-11-18 DIAGNOSIS — E1122 Type 2 diabetes mellitus with diabetic chronic kidney disease: Secondary | ICD-10-CM | POA: Diagnosis not present

## 2017-11-18 DIAGNOSIS — I5042 Chronic combined systolic (congestive) and diastolic (congestive) heart failure: Secondary | ICD-10-CM | POA: Diagnosis not present

## 2017-11-19 DIAGNOSIS — N186 End stage renal disease: Secondary | ICD-10-CM | POA: Diagnosis not present

## 2017-11-19 DIAGNOSIS — D509 Iron deficiency anemia, unspecified: Secondary | ICD-10-CM | POA: Diagnosis not present

## 2017-11-19 DIAGNOSIS — D631 Anemia in chronic kidney disease: Secondary | ICD-10-CM | POA: Diagnosis not present

## 2017-11-19 DIAGNOSIS — E876 Hypokalemia: Secondary | ICD-10-CM | POA: Diagnosis not present

## 2017-11-19 DIAGNOSIS — N2581 Secondary hyperparathyroidism of renal origin: Secondary | ICD-10-CM | POA: Diagnosis not present

## 2017-11-20 DIAGNOSIS — E1122 Type 2 diabetes mellitus with diabetic chronic kidney disease: Secondary | ICD-10-CM | POA: Diagnosis not present

## 2017-11-20 DIAGNOSIS — I482 Chronic atrial fibrillation: Secondary | ICD-10-CM | POA: Diagnosis not present

## 2017-11-20 DIAGNOSIS — I132 Hypertensive heart and chronic kidney disease with heart failure and with stage 5 chronic kidney disease, or end stage renal disease: Secondary | ICD-10-CM | POA: Diagnosis not present

## 2017-11-20 DIAGNOSIS — N186 End stage renal disease: Secondary | ICD-10-CM | POA: Diagnosis not present

## 2017-11-20 DIAGNOSIS — I5042 Chronic combined systolic (congestive) and diastolic (congestive) heart failure: Secondary | ICD-10-CM | POA: Diagnosis not present

## 2017-11-20 DIAGNOSIS — R2689 Other abnormalities of gait and mobility: Secondary | ICD-10-CM | POA: Diagnosis not present

## 2017-11-21 DIAGNOSIS — Z9181 History of falling: Secondary | ICD-10-CM

## 2017-11-21 DIAGNOSIS — Z992 Dependence on renal dialysis: Secondary | ICD-10-CM | POA: Diagnosis not present

## 2017-11-21 DIAGNOSIS — N2581 Secondary hyperparathyroidism of renal origin: Secondary | ICD-10-CM | POA: Diagnosis not present

## 2017-11-21 DIAGNOSIS — D631 Anemia in chronic kidney disease: Secondary | ICD-10-CM | POA: Diagnosis not present

## 2017-11-21 DIAGNOSIS — N186 End stage renal disease: Secondary | ICD-10-CM | POA: Diagnosis not present

## 2017-11-21 DIAGNOSIS — E785 Hyperlipidemia, unspecified: Secondary | ICD-10-CM | POA: Diagnosis not present

## 2017-11-21 DIAGNOSIS — M1990 Unspecified osteoarthritis, unspecified site: Secondary | ICD-10-CM | POA: Diagnosis not present

## 2017-11-21 DIAGNOSIS — R2689 Other abnormalities of gait and mobility: Secondary | ICD-10-CM | POA: Diagnosis not present

## 2017-11-21 DIAGNOSIS — Z853 Personal history of malignant neoplasm of breast: Secondary | ICD-10-CM | POA: Diagnosis not present

## 2017-11-21 DIAGNOSIS — I132 Hypertensive heart and chronic kidney disease with heart failure and with stage 5 chronic kidney disease, or end stage renal disease: Secondary | ICD-10-CM | POA: Diagnosis not present

## 2017-11-21 DIAGNOSIS — I482 Chronic atrial fibrillation: Secondary | ICD-10-CM | POA: Diagnosis not present

## 2017-11-21 DIAGNOSIS — E1122 Type 2 diabetes mellitus with diabetic chronic kidney disease: Secondary | ICD-10-CM | POA: Diagnosis not present

## 2017-11-21 DIAGNOSIS — E876 Hypokalemia: Secondary | ICD-10-CM | POA: Diagnosis not present

## 2017-11-21 DIAGNOSIS — E039 Hypothyroidism, unspecified: Secondary | ICD-10-CM | POA: Diagnosis not present

## 2017-11-21 DIAGNOSIS — D509 Iron deficiency anemia, unspecified: Secondary | ICD-10-CM | POA: Diagnosis not present

## 2017-11-21 DIAGNOSIS — I5042 Chronic combined systolic (congestive) and diastolic (congestive) heart failure: Secondary | ICD-10-CM | POA: Diagnosis not present

## 2017-11-24 DIAGNOSIS — N186 End stage renal disease: Secondary | ICD-10-CM | POA: Diagnosis not present

## 2017-11-24 DIAGNOSIS — E876 Hypokalemia: Secondary | ICD-10-CM | POA: Diagnosis not present

## 2017-11-24 DIAGNOSIS — D631 Anemia in chronic kidney disease: Secondary | ICD-10-CM | POA: Diagnosis not present

## 2017-11-24 DIAGNOSIS — D509 Iron deficiency anemia, unspecified: Secondary | ICD-10-CM | POA: Diagnosis not present

## 2017-11-24 DIAGNOSIS — N2581 Secondary hyperparathyroidism of renal origin: Secondary | ICD-10-CM | POA: Diagnosis not present

## 2017-11-25 DIAGNOSIS — E1122 Type 2 diabetes mellitus with diabetic chronic kidney disease: Secondary | ICD-10-CM | POA: Diagnosis not present

## 2017-11-25 DIAGNOSIS — I132 Hypertensive heart and chronic kidney disease with heart failure and with stage 5 chronic kidney disease, or end stage renal disease: Secondary | ICD-10-CM | POA: Diagnosis not present

## 2017-11-25 DIAGNOSIS — I482 Chronic atrial fibrillation: Secondary | ICD-10-CM | POA: Diagnosis not present

## 2017-11-25 DIAGNOSIS — N186 End stage renal disease: Secondary | ICD-10-CM | POA: Diagnosis not present

## 2017-11-25 DIAGNOSIS — I5042 Chronic combined systolic (congestive) and diastolic (congestive) heart failure: Secondary | ICD-10-CM | POA: Diagnosis not present

## 2017-11-25 DIAGNOSIS — R2689 Other abnormalities of gait and mobility: Secondary | ICD-10-CM | POA: Diagnosis not present

## 2017-11-26 DIAGNOSIS — D509 Iron deficiency anemia, unspecified: Secondary | ICD-10-CM | POA: Diagnosis not present

## 2017-11-26 DIAGNOSIS — D631 Anemia in chronic kidney disease: Secondary | ICD-10-CM | POA: Diagnosis not present

## 2017-11-26 DIAGNOSIS — N2581 Secondary hyperparathyroidism of renal origin: Secondary | ICD-10-CM | POA: Diagnosis not present

## 2017-11-26 DIAGNOSIS — E876 Hypokalemia: Secondary | ICD-10-CM | POA: Diagnosis not present

## 2017-11-26 DIAGNOSIS — N186 End stage renal disease: Secondary | ICD-10-CM | POA: Diagnosis not present

## 2017-11-27 DIAGNOSIS — Z992 Dependence on renal dialysis: Secondary | ICD-10-CM | POA: Diagnosis not present

## 2017-11-27 DIAGNOSIS — D631 Anemia in chronic kidney disease: Secondary | ICD-10-CM | POA: Diagnosis not present

## 2017-11-27 DIAGNOSIS — E876 Hypokalemia: Secondary | ICD-10-CM | POA: Diagnosis not present

## 2017-11-27 DIAGNOSIS — D509 Iron deficiency anemia, unspecified: Secondary | ICD-10-CM | POA: Diagnosis not present

## 2017-11-27 DIAGNOSIS — N2581 Secondary hyperparathyroidism of renal origin: Secondary | ICD-10-CM | POA: Diagnosis not present

## 2017-11-27 DIAGNOSIS — N186 End stage renal disease: Secondary | ICD-10-CM | POA: Diagnosis not present

## 2017-11-27 DIAGNOSIS — T82858A Stenosis of vascular prosthetic devices, implants and grafts, initial encounter: Secondary | ICD-10-CM | POA: Diagnosis not present

## 2017-11-27 DIAGNOSIS — I871 Compression of vein: Secondary | ICD-10-CM | POA: Diagnosis not present

## 2017-11-28 DIAGNOSIS — N2581 Secondary hyperparathyroidism of renal origin: Secondary | ICD-10-CM | POA: Diagnosis not present

## 2017-11-28 DIAGNOSIS — E876 Hypokalemia: Secondary | ICD-10-CM | POA: Diagnosis not present

## 2017-11-28 DIAGNOSIS — D509 Iron deficiency anemia, unspecified: Secondary | ICD-10-CM | POA: Diagnosis not present

## 2017-11-28 DIAGNOSIS — N186 End stage renal disease: Secondary | ICD-10-CM | POA: Diagnosis not present

## 2017-11-28 DIAGNOSIS — D631 Anemia in chronic kidney disease: Secondary | ICD-10-CM | POA: Diagnosis not present

## 2017-12-01 ENCOUNTER — Emergency Department (HOSPITAL_COMMUNITY): Payer: Medicare Other

## 2017-12-01 ENCOUNTER — Inpatient Hospital Stay (HOSPITAL_COMMUNITY)
Admission: EM | Admit: 2017-12-01 | Discharge: 2017-12-08 | DRG: 070 | Disposition: A | Discharge status: Hospice | Payer: Medicare Other | Attending: Internal Medicine | Admitting: Internal Medicine

## 2017-12-01 ENCOUNTER — Other Ambulatory Visit: Payer: Self-pay

## 2017-12-01 DIAGNOSIS — I10 Essential (primary) hypertension: Secondary | ICD-10-CM | POA: Diagnosis present

## 2017-12-01 DIAGNOSIS — D509 Iron deficiency anemia, unspecified: Secondary | ICD-10-CM | POA: Diagnosis not present

## 2017-12-01 DIAGNOSIS — Z7189 Other specified counseling: Secondary | ICD-10-CM

## 2017-12-01 DIAGNOSIS — E119 Type 2 diabetes mellitus without complications: Secondary | ICD-10-CM

## 2017-12-01 DIAGNOSIS — I82441 Acute embolism and thrombosis of right tibial vein: Secondary | ICD-10-CM | POA: Diagnosis not present

## 2017-12-01 DIAGNOSIS — R4182 Altered mental status, unspecified: Secondary | ICD-10-CM | POA: Diagnosis not present

## 2017-12-01 DIAGNOSIS — E785 Hyperlipidemia, unspecified: Secondary | ICD-10-CM | POA: Diagnosis present

## 2017-12-01 DIAGNOSIS — N2581 Secondary hyperparathyroidism of renal origin: Secondary | ICD-10-CM | POA: Diagnosis present

## 2017-12-01 DIAGNOSIS — R9431 Abnormal electrocardiogram [ECG] [EKG]: Secondary | ICD-10-CM | POA: Diagnosis not present

## 2017-12-01 DIAGNOSIS — R110 Nausea: Secondary | ICD-10-CM

## 2017-12-01 DIAGNOSIS — R627 Adult failure to thrive: Secondary | ICD-10-CM | POA: Diagnosis present

## 2017-12-01 DIAGNOSIS — C50919 Malignant neoplasm of unspecified site of unspecified female breast: Secondary | ICD-10-CM | POA: Diagnosis present

## 2017-12-01 DIAGNOSIS — Z803 Family history of malignant neoplasm of breast: Secondary | ICD-10-CM

## 2017-12-01 DIAGNOSIS — E876 Hypokalemia: Secondary | ICD-10-CM | POA: Diagnosis not present

## 2017-12-01 DIAGNOSIS — R413 Other amnesia: Secondary | ICD-10-CM | POA: Diagnosis present

## 2017-12-01 DIAGNOSIS — N186 End stage renal disease: Secondary | ICD-10-CM

## 2017-12-01 DIAGNOSIS — D631 Anemia in chronic kidney disease: Secondary | ICD-10-CM | POA: Diagnosis not present

## 2017-12-01 DIAGNOSIS — Z9012 Acquired absence of left breast and nipple: Secondary | ICD-10-CM

## 2017-12-01 DIAGNOSIS — D649 Anemia, unspecified: Secondary | ICD-10-CM | POA: Diagnosis present

## 2017-12-01 DIAGNOSIS — E039 Hypothyroidism, unspecified: Secondary | ICD-10-CM | POA: Diagnosis present

## 2017-12-01 DIAGNOSIS — Z981 Arthrodesis status: Secondary | ICD-10-CM

## 2017-12-01 DIAGNOSIS — W19XXXA Unspecified fall, initial encounter: Secondary | ICD-10-CM

## 2017-12-01 DIAGNOSIS — Z7984 Long term (current) use of oral hypoglycemic drugs: Secondary | ICD-10-CM

## 2017-12-01 DIAGNOSIS — Z853 Personal history of malignant neoplasm of breast: Secondary | ICD-10-CM

## 2017-12-01 DIAGNOSIS — Z992 Dependence on renal dialysis: Secondary | ICD-10-CM

## 2017-12-01 DIAGNOSIS — I480 Paroxysmal atrial fibrillation: Secondary | ICD-10-CM | POA: Diagnosis present

## 2017-12-01 DIAGNOSIS — I824Z2 Acute embolism and thrombosis of unspecified deep veins of left distal lower extremity: Secondary | ICD-10-CM | POA: Diagnosis present

## 2017-12-01 DIAGNOSIS — G9340 Encephalopathy, unspecified: Secondary | ICD-10-CM | POA: Diagnosis not present

## 2017-12-01 DIAGNOSIS — Z833 Family history of diabetes mellitus: Secondary | ICD-10-CM

## 2017-12-01 DIAGNOSIS — I132 Hypertensive heart and chronic kidney disease with heart failure and with stage 5 chronic kidney disease, or end stage renal disease: Secondary | ICD-10-CM | POA: Diagnosis not present

## 2017-12-01 DIAGNOSIS — Z7982 Long term (current) use of aspirin: Secondary | ICD-10-CM

## 2017-12-01 DIAGNOSIS — I82890 Acute embolism and thrombosis of other specified veins: Secondary | ICD-10-CM | POA: Diagnosis present

## 2017-12-01 DIAGNOSIS — M898X9 Other specified disorders of bone, unspecified site: Secondary | ICD-10-CM | POA: Diagnosis present

## 2017-12-01 DIAGNOSIS — Z8349 Family history of other endocrine, nutritional and metabolic diseases: Secondary | ICD-10-CM

## 2017-12-01 DIAGNOSIS — J449 Chronic obstructive pulmonary disease, unspecified: Secondary | ICD-10-CM | POA: Diagnosis not present

## 2017-12-01 DIAGNOSIS — I5042 Chronic combined systolic (congestive) and diastolic (congestive) heart failure: Secondary | ICD-10-CM | POA: Diagnosis not present

## 2017-12-01 DIAGNOSIS — Z885 Allergy status to narcotic agent status: Secondary | ICD-10-CM

## 2017-12-01 DIAGNOSIS — Z66 Do not resuscitate: Secondary | ICD-10-CM | POA: Diagnosis present

## 2017-12-01 DIAGNOSIS — I700 Atherosclerosis of aorta: Secondary | ICD-10-CM | POA: Diagnosis not present

## 2017-12-01 DIAGNOSIS — M1990 Unspecified osteoarthritis, unspecified site: Secondary | ICD-10-CM | POA: Diagnosis present

## 2017-12-01 DIAGNOSIS — E1122 Type 2 diabetes mellitus with diabetic chronic kidney disease: Secondary | ICD-10-CM | POA: Diagnosis present

## 2017-12-01 DIAGNOSIS — Z515 Encounter for palliative care: Secondary | ICD-10-CM

## 2017-12-01 DIAGNOSIS — Z7901 Long term (current) use of anticoagulants: Secondary | ICD-10-CM

## 2017-12-01 DIAGNOSIS — Z9842 Cataract extraction status, left eye: Secondary | ICD-10-CM

## 2017-12-01 DIAGNOSIS — Z888 Allergy status to other drugs, medicaments and biological substances status: Secondary | ICD-10-CM

## 2017-12-01 DIAGNOSIS — Z9841 Cataract extraction status, right eye: Secondary | ICD-10-CM

## 2017-12-01 DIAGNOSIS — Z79890 Hormone replacement therapy (postmenopausal): Secondary | ICD-10-CM

## 2017-12-01 LAB — COMPREHENSIVE METABOLIC PANEL
ALBUMIN: 2.3 g/dL — AB (ref 3.5–5.0)
ALK PHOS: 215 U/L — AB (ref 38–126)
ALT: 16 U/L (ref 0–44)
AST: 35 U/L (ref 15–41)
Anion gap: 16 — ABNORMAL HIGH (ref 5–15)
BUN: 13 mg/dL (ref 8–23)
CALCIUM: 8 mg/dL — AB (ref 8.9–10.3)
CO2: 24 mmol/L (ref 22–32)
Chloride: 99 mmol/L (ref 98–111)
Creatinine, Ser: 4.06 mg/dL — ABNORMAL HIGH (ref 0.44–1.00)
GFR calc Af Amer: 10 mL/min — ABNORMAL LOW (ref >60)
GFR, EST NON AFRICAN AMERICAN: 9 mL/min — AB (ref >60)
GLUCOSE: 148 mg/dL — AB (ref 70–99)
Potassium: 3.7 mmol/L (ref 3.5–5.1)
Sodium: 139 mmol/L (ref 135–145)
TOTAL PROTEIN: 5.8 g/dL — AB (ref 6.5–8.1)
Total Bilirubin: 0.9 mg/dL (ref 0.3–1.2)

## 2017-12-01 LAB — CBC WITH DIFFERENTIAL/PLATELET
Abs Immature Granulocytes: 0.1 10*3/uL (ref 0.0–0.1)
Basophils Absolute: 0 10*3/uL (ref 0.0–0.1)
Basophils Relative: 0 %
EOS ABS: 0 10*3/uL (ref 0.0–0.7)
Eosinophils Relative: 0 %
HEMATOCRIT: 28.9 % — AB (ref 36.0–46.0)
HEMOGLOBIN: 9.1 g/dL — AB (ref 12.0–15.0)
IMMATURE GRANULOCYTES: 1 %
LYMPHS ABS: 0.7 10*3/uL (ref 0.7–4.0)
LYMPHS PCT: 7 %
MCH: 27.6 pg (ref 26.0–34.0)
MCHC: 31.5 g/dL (ref 30.0–36.0)
MCV: 87.6 fL (ref 78.0–100.0)
MONOS PCT: 7 %
Monocytes Absolute: 0.7 10*3/uL (ref 0.1–1.0)
NEUTROS ABS: 8 10*3/uL — AB (ref 1.7–7.7)
NEUTROS PCT: 85 %
Platelets: 171 10*3/uL (ref 150–400)
RBC: 3.3 MIL/uL — AB (ref 3.87–5.11)
RDW: 18.2 % — AB (ref 11.5–15.5)
WBC: 9.4 10*3/uL (ref 4.0–10.5)

## 2017-12-01 LAB — I-STAT CG4 LACTIC ACID, ED: Lactic Acid, Venous: 5.75 mmol/L (ref 0.5–1.9)

## 2017-12-01 LAB — AMMONIA: AMMONIA: 49 umol/L — AB (ref 9–35)

## 2017-12-01 LAB — POC OCCULT BLOOD, ED: Fecal Occult Bld: POSITIVE — AB

## 2017-12-01 LAB — I-STAT TROPONIN, ED: Troponin i, poc: 0.03 ng/mL (ref 0.00–0.08)

## 2017-12-01 LAB — D-DIMER, QUANTITATIVE: D-Dimer, Quant: 5.04 ug/mL-FEU — ABNORMAL HIGH (ref 0.00–0.50)

## 2017-12-01 MED ORDER — SODIUM CHLORIDE 0.9 % IV BOLUS
500.0000 mL | Freq: Once | INTRAVENOUS | Status: AC
  Administered 2017-12-02: 500 mL via INTRAVENOUS

## 2017-12-01 MED ORDER — IOPAMIDOL (ISOVUE-370) INJECTION 76%
100.0000 mL | Freq: Once | INTRAVENOUS | Status: AC | PRN
  Administered 2017-12-01: 100 mL via INTRAVENOUS

## 2017-12-01 MED ORDER — IOPAMIDOL (ISOVUE-370) INJECTION 76%
INTRAVENOUS | Status: AC
  Filled 2017-12-01: qty 100

## 2017-12-01 MED ORDER — SODIUM CHLORIDE 0.9 % IV SOLN
1.0000 g | Freq: Once | INTRAVENOUS | Status: AC
  Administered 2017-12-02: 1 g via INTRAVENOUS
  Filled 2017-12-01: qty 10

## 2017-12-01 NOTE — ED Triage Notes (Signed)
Pt's family member reports confusion and disorientation since Saturday. Pt normally alert and oriented, normally can walk with walker. Pt unable to to stand. Pt complaining of SHOB, leg swelling. Pt has hx of dialysis and CHF. Pt hypotensive 99/45.

## 2017-12-01 NOTE — ED Provider Notes (Addendum)
[facility] EMERGENCY DEPARTMENT Provider Note   CSN: [id number] Arrival date & time: 12/01/17  1808     History   Chief Complaint Chief Complaint  Patient presents with  . Shortness of Breath  . Altered Mental Status    HPI [name] is a 84 y.o. female. 5 caveat secondary to mental status HPI  84 yo female presents today with her daughter from dialysis which she goes to on Monday Wednesday and Friday and was reported to have some altered mental status there..  Daughter is the main historian.  Daughter states that she is having increased pain in her right lower extremity.  She thinks her mother may have fallen multiple times last week.  She states that she sits in a chair that helps her stand.  She states the chair curves forward and sometimes her mother falls her head.  She found her on the ground in front of it with her walker nearby several times last week.  She is saying that she just slid out of the chair.  However now she has noted increased swelling and pain to her right lower extremity.  Her mother has increasing confusion.  She goes to dialysis Monday Wednesday Friday and has not missed dialysis.  She went to dialysis today.  There is report that the daughter was called from dialysis today decided the patient was disoriented.  She reports that she has had several loose bowel movements over the past several days.  She was on antibiotics last month, review of note from primary care states that she was started on doxycycline twice daily on August 15 for concern of developing pneumonia no lateralized weakness has been noted.  Past Medical History:  Diagnosis Date  . Arthritis   . Atrial fibrillation ([place]) 06/25/2012   2D Echo - EF 45-50, left atrium severely dilated, mitral valve heavily calcified annulus with calcification of the subvalvular apparatus - no longer on Coumadin  . Breast cancer ([place])   . CHF (congestive heart failure) ([place])   . DM2 (diabetes  mellitus, type 2) ([place])   . ESRD (end stage renal disease) ([place])    HD MWF - [facility]  . HTN (hypertension)   . Hyperlipidemia   . Hyperparathyroidism, secondary renal ([place])   . Hypothyroid   . Right tibial fracture 06/21/2012  . Shortness of breath   . Tibia/fibula fracture 07/10/2011   right - nonunion repair 06/2012    Patient Active Problem List   Diagnosis Date Noted  . Poor balance 10/30/2017  . Physical deconditioning 10/30/2017  . Frequent falls 10/30/2017  . Cough 10/30/2017  . Leg fracture 08/13/2016  . Hypothyroidism 08/30/2015  . Labile blood glucose   . Memory loss   . Hip fracture ([place]) 07/12/2015  . ESRD on dialysis ([place])   . PAF (paroxysmal atrial fibrillation) ([place])   . Chronic combined systolic and diastolic heart failure EF 45-50% by echo 06/25/2012 07/30/2012  . Breast cancer, s/p Lt mastectomy 07/22/2012  . Pleural effusion due to CHF (congestive heart failure) ([place]) 07/20/2012  . Hypotension, unspecified 06/25/2012  . Metabolic bone disease 26/33/3545  . Secondary hyperparathyroidism of renal origin ([place]) 06/19/2012  . Nonunion of fracture, R tibia 06/17/2012  . H/O radioactive iodine thyroid ablation   . Closed fracture of right fibula and tibia 07/10/2011  . HTN (hypertension) 07/10/2011  . DM2 (diabetes mellitus, type 2) ([place]) 07/10/2011  . Hyperlipidemia 07/10/2011  . Anemia 07/10/2011    Past Surgical History:  Procedure Laterality Date  . ABDOMINAL SURGERY    . ANKLE FUSION Right   . ARTERIOVENOUS GRAFT PLACEMENT Right   . BREAST SURGERY Left    Mastectomy  . EYE SURGERY Bilateral    Cataract  . FEMUR IM NAIL Right 08/15/2016   Procedure: INTRAMEDULLARY (IM) RETROGRADE FEMORAL NAILING;  Surgeon: Renette Butters, MD;  Location: [facility];  Service: Orthopedics;  Laterality: Right;  . MASTECTOMY    . ORIF TIBIA FRACTURE Right 06/16/2012   Procedure: OPEN REDUCTION INTERNAL FIXATION (ORIF) TIBIA FRACTURE;  Surgeon: Rozanna Box, MD;  Location:  [facility];  Service: Orthopedics;  Laterality: Right;  . THYROID SURGERY       OB History   None      Home Medications    Prior to Admission medications   Medication Sig Start Date End Date Taking? Authorizing Provider  acetaminophen (TYLENOL) 325 MG tablet Take 2 tablets (650 mg total) by mouth every 6 (six) hours as needed for mild pain or moderate pain. 08/17/16   Mariel Aloe, MD  aspirin EC 325 MG tablet Take 1 tablet (325 mg total) by mouth daily. For 30 days post op for DVT Prophylaxis.  Resume 81 mg Aspirin after this has been completed. 08/15/16   Prudencio Burly III, PA-C  b complex-vitamin c-folic acid (NEPHRO-VITE) 0.8 MG TABS tablet Take 1 tablet by mouth at bedtime. 08/29/15   Binnie Rail, MD  Cholecalciferol (VITAMIN D-3 PO) Take 1 capsule by mouth daily.    [provider]  darbepoetin (ARANESP) 100 MCG/0.5ML SOLN Inject 0.5 mLs (100 mcg total) into the vein every Wednesday with hemodialysis. 07/25/12   Erlene Quan, PA-C  diclofenac sodium (VOLTAREN) 1 % GEL APPLY 2 GRAMS TOPICALLY EVERY SHIFT FOR PAIN 12/20/16   Binnie Rail, MD  doxycycline (VIBRA-TABS) 100 MG tablet Take 1 tablet (100 mg total) by mouth 2 (two) times daily. 10/30/17   Burns, Claudina Lick, MD  glipiZIDE (GLUCOTROL) 5 MG tablet Take 1 tablet by mouth 2 (two) times daily. 10/14/17   [provider]  levothyroxine (SYNTHROID, LEVOTHROID) 75 MCG tablet TAKE 1 TABLET BY MOUTH ONCE DAILY FOR HYPOTHYROIDISM 12/20/16   Burns, Claudina Lick, MD  methocarbamol (ROBAXIN) 500 MG tablet Take 1 tablet (500 mg total) by mouth every 6 (six) hours as needed for muscle spasms. 08/17/16   Mariel Aloe, MD  nebivolol (BYSTOLIC) 10 MG tablet Take 1 tablet (10 mg total) by mouth daily. 10/30/17   Binnie Rail, MD  Nutritional Supplements (FEEDING SUPPLEMENT, NEPRO CARB STEADY,) LIQD Take 237 mLs by mouth 3 (three) times daily as needed (Supplement). 08/17/16   Mariel Aloe, MD  omeprazole (PRILOSEC) 20 MG  capsule TAKE ONE CAPSULE BY MOUTH IN THE EVENING FOR GERD 12/20/16   Burns, Claudina Lick, MD  potassium chloride SA (K-DUR,KLOR-CON) 20 MEQ tablet Take 2 tablets (40 mEq total) by mouth once for 1 dose. 10/30/17 10/30/17  Binnie Rail, MD  pravastatin (PRAVACHOL) 20 MG tablet Take 1 tablet (20 mg total) by mouth daily. -- Office visit needed for further refills 09/22/17   Binnie Rail, MD  sevelamer carbonate (RENVELA) 800 MG tablet TAKE 1 TABLET BY MOUTH DAILY WITH A MEAL 10/16/17   Burns, Claudina Lick, MD  vitamin E 200 UNIT capsule Take 200 Units by mouth daily.    [provider]    Family History Family History  Problem Relation Age of Onset  . Hyperlipidemia Father   .  Diabetes Father   . Breast cancer Sister     Social History Social History   Tobacco Use  . Smoking status: Never Smoker  . Smokeless tobacco: Current User    Types: Snuff  Substance Use Topics  . Alcohol use: No  . Drug use: No     Allergies   Codeine; Coreg [carvedilol]; Lopressor [metoprolol tartrate]; Metoprolol; and Oxycodone   Review of Systems Review of Systems  Unable to perform ROS: Mental status change  All other systems reviewed and are negative.    Physical Exam Updated Vital Signs BP (!) 99/45   Pulse (!) 105   Temp 97.8 F (36.6 C) (Oral)   Resp (!) 22   SpO2 100%   Physical Exam  Constitutional: She appears well-developed and well-nourished.  HENT:  Head: Normocephalic and atraumatic.  Mouth/Throat: Oropharynx is clear and moist.  Eyes:  Pupils are small and reactive bilaterally Extraocular movements are intact  Neck: Normal range of motion. Neck supple.  Cardiovascular: Normal rate.  Pulmonary/Chest: Effort normal and breath sounds normal.  Abdominal: Soft. Bowel sounds are normal.  Musculoskeletal: Normal range of motion.       Right lower leg: She exhibits tenderness and edema.       Left lower leg: She exhibits edema.  Neurological: She is alert.  Skin: Skin is warm  and dry. Capillary refill takes less than 2 seconds.  Bilateral lower extremities have edema that is pitting. Bilateral femoral pulses are bounding Unable to palpate dorsal pedals pulses bilaterally Left foot is cooler than right foot Right lower extremity is tender to palpation with chronic skin changes and warmth  Psychiatric: She has a normal mood and affect.  Nursing note and vitals reviewed.    ED Treatments / Results  Labs (all labs ordered are listed, but only abnormal results are displayed) Labs Reviewed  I-STAT CG4 LACTIC ACID, ED - Abnormal; Notable for the following components:      Result Value   Lactic Acid, Venous 5.75 (*)    All other components within normal limits  CULTURE, BLOOD (ROUTINE X 2)  CULTURE, BLOOD (ROUTINE X 2)  COMPREHENSIVE METABOLIC PANEL  CBC  AMMONIA  BLOOD GAS, VENOUS  I-STAT CG4 LACTIC ACID, ED  I-STAT TROPONIN, ED  CBG MONITORING, ED    EKG EKG Interpretation  Date/Time:  Monday December 01 2017 19:37:52 EDT Ventricular Rate:  97 PR Interval:  140 QRS Duration: 66 QT Interval:  396 QTC Calculation: 502 R Axis:   61 Text Interpretation:  Sinus rhythm with Premature atrial complexes with Abberant conduction Anterior infarct , age undetermined Abnormal ECG Confirmed by Pattricia Boss [id number]) on 12/01/2017 10:24:50 PM   Radiology Dg Chest 2 View  Result Date: 12/01/2017 CLINICAL DATA:  Confusion and disorientation. Normally alert and oriented. EXAM: CHEST - 2 VIEW COMPARISON:  10/30/2017. FINDINGS: The heart is enlarged. Calcified ascending aorta. No consolidation or edema. No effusion or pneumothorax. Hyperinflation suggesting COPD. Osteopenia. IMPRESSION: COPD. Cardiomegaly. Thoracic atherosclerosis. No active infiltrates or failure. Improved aeration from priors. Electronically Signed   By: Staci Righter M.D.   On: 12/01/2017 20:36   Ct Head Wo Contrast  Result Date: 12/01/2017 CLINICAL DATA:  Altered mental status EXAM: CT HEAD  WITHOUT CONTRAST TECHNIQUE: Contiguous axial images were obtained from the base of the skull through the vertex without intravenous contrast. COMPARISON:  None. FINDINGS: Brain: There is no mass, hemorrhage or extra-axial collection. The size and configuration of the ventricles and extra-axial CSF spaces  are normal. There is no acute or chronic infarction. There is hypoattenuation of the periventricular white matter, most commonly indicating chronic ischemic microangiopathy. Vascular: No abnormal hyperdensity of the major intracranial arteries or dural venous sinuses. No intracranial atherosclerosis. Skull: The visualized skull base, calvarium and extracranial soft tissues are normal. Sinuses/Orbits: No fluid levels or advanced mucosal thickening of the visualized paranasal sinuses. No mastoid or middle ear effusion. The orbits are normal. IMPRESSION: Chronic ischemic microangiopathy without acute intracranial abnormality. Electronically Signed   By: Ulyses Jarred M.D.   On: 12/01/2017 21:57   Ct Angio Chest Pe W And/or Wo Contrast  Result Date: 12/01/2017 CLINICAL DATA:  Confusion and disorientation since Saturday. EXAM: CT ANGIOGRAPHY CHEST WITH CONTRAST TECHNIQUE: Multidetector CT imaging of the chest was performed using the standard protocol during bolus administration of intravenous contrast. Multiplanar CT image reconstructions and MIPs were obtained to evaluate the vascular anatomy. CONTRAST:  50 cc ISOVUE-370 IOPAMIDOL (ISOVUE-370) INJECTION 76% COMPARISON:  12/02/2013 FINDINGS: Cardiovascular: The study is of quality for the evaluation of pulmonary embolism. There are no filling defects in the central, lobar, segmental or subsegmental pulmonary artery branches to suggest acute pulmonary embolism. Atherosclerosis of the great vessels with conventional branch pattern left main and three-vessel coronary arteriosclerosis. Mediastinum/Nodes: No discrete thyroid nodules. Unremarkable esophagus. No  pathologically enlarged axillary, mediastinal or hilar lymph nodes. Lungs/Pleura: No pneumothorax. No pleural effusion. Redemonstration of multiple bilateral noncalcified pulmonary nodules, measuring on the order of 5 mm or less. No confluent airspace opacity. Upper abdomen: Fat containing right [place]k  hernia. Musculoskeletal:  No aggressive appearing focal osseous lesions. Review of the MIP images confirms the above findings. IMPRESSION: 1. Stable cardiomegaly with aortic atherosclerosis and coronary arteriosclerosis. 2. No acute pulmonary embolus. 3. Scattered noncalcified 5 mm or less pulmonary nodules unchanged in appearance. Given long-term stability, findings likely to represent a benign etiology. Aortic Atherosclerosis (ICD10-I70.0). Electronically Signed   By: Ashley Royalty M.D.   On: 12/01/2017 22:01    Procedures .Critical Care Performed by: Pattricia Boss, MD Authorized by: Pattricia Boss, MD   Critical care provider statement:    Critical care time (minutes):  45   Critical care end time:  12/01/2017 11:15 PM   Critical care was necessary to treat or prevent imminent or life-threatening deterioration of the following conditions:  CNS failure or compromise and sepsis   Critical care was time spent personally by me on the following activities:  Discussions with consultants, evaluation of patient's response to treatment, examination of patient, ordering and performing treatments and interventions, ordering and review of laboratory studies, ordering and review of radiographic studies, pulse oximetry, re-evaluation of patient's condition, obtaining history from patient or surrogate and review of old charts   (including critical care time)  Medications Ordered in ED Medications - No data to display   Initial Impression / Assessment and Plan / ED Course  I have reviewed the triage vital signs and the nursing notes.  Pertinent labs & imaging results that were available during my care of the  patient were reviewed by me and considered in my medical decision making (see chart for details).     1- altered mental status- nonfocal neuro exam with increased confusion noted at dialysis today.  Head ct without bleed or acute abnormalities.  2-rle swelling and pain-dvt vs infection- patient without fever but elevated lactate present.  IV ns 500 cc being infused and will recheck lactic as patient with bp 99/45 obtained here after dialysis. Rocephin iv given  for possible cellulitis IV NS being infused now with repeat bp increasing.  Repeat lactic acid sent prior to fluid infused 3- decreased appetite with loose stools-heme tested by nurse on scant amount of stool present and positive but no melena, maroon stool, or brbpr noted 4- discussed code status with daughter, Myraette McGibbones and states DNR  Discussed with Dr. Hal Hope and will see for admission Final Clinical Impressions(s) / ED Diagnoses   Final diagnoses:  Altered mental status, unspecified altered mental status type    ED Discharge Orders    None       Pattricia Boss, MD 12/01/17 [id number]    Pattricia Boss, MD 12/01/17 [id number]    Pattricia Boss, MD 12/02/17 0030

## 2017-12-01 NOTE — ED Notes (Signed)
I Stat Lac Acid results of 5.75 reported to [employer] PA

## 2017-12-01 NOTE — ED Notes (Signed)
Patient transported to CT 

## 2017-12-01 NOTE — ED Provider Notes (Signed)
MSE was initiated and I personally evaluated the patient and placed orders (if any) at  7:53 PM on December 01, 2017.  The patient appears stable so that the remainder of the MSE may be completed by another provider.  .Patient placed in Quick Look pathway, seen and evaluated   Chief Complaint: Altered mental status  HPI:   Patient is an [place] female with a history of type 2 diabetes mellitus, ESRD on dialysis Monday, Wednesday, Friday, hyperlipidemia, and hypothyroidism presenting for altered mental status.  Patient presents with her daughter who assist in history taking.  She reports that she is a call from dialysis today stating that patient was disoriented to time stating that was in 1940.  She reports that her mother has appeared sleepy.  She reports that her mother has had diarrhea for several days but has not noticed any melena or hematochezia.  She has not noticed any facial droop, slurred speech, or weakness on any side of the body.  She denies that her mother has had any fever or chills.  ROS: See HPI (one)  Physical Exam:   Gen: No distress  Neuro: Arousable to voice, but otherwise is going to sleep.  Skin: Warm    Focused Exam: PERRL. 42mm b/l. EOMI.   Heart regular rate and rhythm.  Lungs clear to auscultation.  Right upper extremity AV fistula with palpable thrill. Abdomen nontender.   Initiation of care has begun. The patient has been counseled on the process, plan, and necessity for staying for the completion/evaluation, and the remainder of the medical screening examination    Tamala Julian 12/01/17 1956    Virgel Manifold, MD 12/02/17 [phone]

## 2017-12-02 ENCOUNTER — Encounter (HOSPITAL_COMMUNITY): Payer: Self-pay | Admitting: Internal Medicine

## 2017-12-02 ENCOUNTER — Observation Stay (HOSPITAL_COMMUNITY): Payer: Medicare Other

## 2017-12-02 DIAGNOSIS — G9340 Encephalopathy, unspecified: Secondary | ICD-10-CM | POA: Diagnosis present

## 2017-12-02 DIAGNOSIS — R609 Edema, unspecified: Secondary | ICD-10-CM | POA: Diagnosis not present

## 2017-12-02 DIAGNOSIS — Z992 Dependence on renal dialysis: Secondary | ICD-10-CM | POA: Diagnosis not present

## 2017-12-02 DIAGNOSIS — I509 Heart failure, unspecified: Secondary | ICD-10-CM | POA: Diagnosis not present

## 2017-12-02 DIAGNOSIS — I82401 Acute embolism and thrombosis of unspecified deep veins of right lower extremity: Secondary | ICD-10-CM | POA: Diagnosis not present

## 2017-12-02 DIAGNOSIS — I5042 Chronic combined systolic (congestive) and diastolic (congestive) heart failure: Secondary | ICD-10-CM | POA: Diagnosis present

## 2017-12-02 DIAGNOSIS — Z7189 Other specified counseling: Secondary | ICD-10-CM | POA: Diagnosis not present

## 2017-12-02 DIAGNOSIS — E039 Hypothyroidism, unspecified: Secondary | ICD-10-CM

## 2017-12-02 DIAGNOSIS — E1159 Type 2 diabetes mellitus with other circulatory complications: Secondary | ICD-10-CM | POA: Diagnosis not present

## 2017-12-02 DIAGNOSIS — I82441 Acute embolism and thrombosis of right tibial vein: Secondary | ICD-10-CM | POA: Diagnosis present

## 2017-12-02 DIAGNOSIS — Z9012 Acquired absence of left breast and nipple: Secondary | ICD-10-CM | POA: Diagnosis not present

## 2017-12-02 DIAGNOSIS — I10 Essential (primary) hypertension: Secondary | ICD-10-CM | POA: Diagnosis not present

## 2017-12-02 DIAGNOSIS — E1122 Type 2 diabetes mellitus with diabetic chronic kidney disease: Secondary | ICD-10-CM | POA: Diagnosis not present

## 2017-12-02 DIAGNOSIS — I82890 Acute embolism and thrombosis of other specified veins: Secondary | ICD-10-CM | POA: Diagnosis present

## 2017-12-02 DIAGNOSIS — I120 Hypertensive chronic kidney disease with stage 5 chronic kidney disease or end stage renal disease: Secondary | ICD-10-CM | POA: Diagnosis not present

## 2017-12-02 DIAGNOSIS — I82409 Acute embolism and thrombosis of unspecified deep veins of unspecified lower extremity: Secondary | ICD-10-CM | POA: Diagnosis not present

## 2017-12-02 DIAGNOSIS — I480 Paroxysmal atrial fibrillation: Secondary | ICD-10-CM | POA: Diagnosis not present

## 2017-12-02 DIAGNOSIS — Z981 Arthrodesis status: Secondary | ICD-10-CM | POA: Diagnosis not present

## 2017-12-02 DIAGNOSIS — R627 Adult failure to thrive: Secondary | ICD-10-CM | POA: Diagnosis not present

## 2017-12-02 DIAGNOSIS — Z66 Do not resuscitate: Secondary | ICD-10-CM | POA: Diagnosis present

## 2017-12-02 DIAGNOSIS — Z7984 Long term (current) use of oral hypoglycemic drugs: Secondary | ICD-10-CM | POA: Diagnosis not present

## 2017-12-02 DIAGNOSIS — D631 Anemia in chronic kidney disease: Secondary | ICD-10-CM | POA: Diagnosis not present

## 2017-12-02 DIAGNOSIS — N186 End stage renal disease: Secondary | ICD-10-CM | POA: Diagnosis not present

## 2017-12-02 DIAGNOSIS — I132 Hypertensive heart and chronic kidney disease with heart failure and with stage 5 chronic kidney disease, or end stage renal disease: Secondary | ICD-10-CM | POA: Diagnosis present

## 2017-12-02 DIAGNOSIS — N2581 Secondary hyperparathyroidism of renal origin: Secondary | ICD-10-CM | POA: Diagnosis present

## 2017-12-02 DIAGNOSIS — M1990 Unspecified osteoarthritis, unspecified site: Secondary | ICD-10-CM | POA: Diagnosis present

## 2017-12-02 DIAGNOSIS — Z9842 Cataract extraction status, left eye: Secondary | ICD-10-CM | POA: Diagnosis not present

## 2017-12-02 DIAGNOSIS — Z7901 Long term (current) use of anticoagulants: Secondary | ICD-10-CM | POA: Diagnosis not present

## 2017-12-02 DIAGNOSIS — S3993XA Unspecified injury of pelvis, initial encounter: Secondary | ICD-10-CM | POA: Diagnosis not present

## 2017-12-02 DIAGNOSIS — I4891 Unspecified atrial fibrillation: Secondary | ICD-10-CM | POA: Diagnosis not present

## 2017-12-02 DIAGNOSIS — Z515 Encounter for palliative care: Secondary | ICD-10-CM | POA: Diagnosis not present

## 2017-12-02 DIAGNOSIS — R4182 Altered mental status, unspecified: Secondary | ICD-10-CM | POA: Diagnosis not present

## 2017-12-02 DIAGNOSIS — I824Z2 Acute embolism and thrombosis of unspecified deep veins of left distal lower extremity: Secondary | ICD-10-CM | POA: Diagnosis present

## 2017-12-02 DIAGNOSIS — Z853 Personal history of malignant neoplasm of breast: Secondary | ICD-10-CM | POA: Diagnosis not present

## 2017-12-02 DIAGNOSIS — E785 Hyperlipidemia, unspecified: Secondary | ICD-10-CM | POA: Diagnosis present

## 2017-12-02 DIAGNOSIS — Z7982 Long term (current) use of aspirin: Secondary | ICD-10-CM | POA: Diagnosis not present

## 2017-12-02 DIAGNOSIS — Z9841 Cataract extraction status, right eye: Secondary | ICD-10-CM | POA: Diagnosis not present

## 2017-12-02 DIAGNOSIS — N281 Cyst of kidney, acquired: Secondary | ICD-10-CM | POA: Diagnosis not present

## 2017-12-02 DIAGNOSIS — N189 Chronic kidney disease, unspecified: Secondary | ICD-10-CM | POA: Diagnosis not present

## 2017-12-02 LAB — CBC
HEMATOCRIT: 27.7 % — AB (ref 36.0–46.0)
Hemoglobin: 8.7 g/dL — ABNORMAL LOW (ref 12.0–15.0)
MCH: 27.8 pg (ref 26.0–34.0)
MCHC: 31.4 g/dL (ref 30.0–36.0)
MCV: 88.5 fL (ref 78.0–100.0)
PLATELETS: 174 10*3/uL (ref 150–400)
RBC: 3.13 MIL/uL — ABNORMAL LOW (ref 3.87–5.11)
RDW: 18.7 % — AB (ref 11.5–15.5)
WBC: 9 10*3/uL (ref 4.0–10.5)

## 2017-12-02 LAB — I-STAT CG4 LACTIC ACID, ED: LACTIC ACID, VENOUS: 2.41 mmol/L — AB (ref 0.5–1.9)

## 2017-12-02 LAB — MRSA PCR SCREENING: MRSA by PCR: NEGATIVE

## 2017-12-02 LAB — GLUCOSE, CAPILLARY
GLUCOSE-CAPILLARY: 129 mg/dL — AB (ref 70–99)
GLUCOSE-CAPILLARY: 146 mg/dL — AB (ref 70–99)
GLUCOSE-CAPILLARY: 160 mg/dL — AB (ref 70–99)
Glucose-Capillary: 66 mg/dL — ABNORMAL LOW (ref 70–99)
Glucose-Capillary: 112 mg/dL — ABNORMAL HIGH (ref 70–99)
Glucose-Capillary: 142 mg/dL — ABNORMAL HIGH (ref 70–99)

## 2017-12-02 LAB — CREATININE, SERUM
Creatinine, Ser: 4.4 mg/dL — ABNORMAL HIGH (ref 0.44–1.00)
GFR, EST AFRICAN AMERICAN: 10 mL/min — AB (ref >60)
GFR, EST NON AFRICAN AMERICAN: 8 mL/min — AB (ref >60)

## 2017-12-02 LAB — LACTIC ACID, PLASMA
LACTIC ACID, VENOUS: 3 mmol/L — AB (ref 0.5–1.9)
Lactic Acid, Venous: 2.8 mmol/L (ref 0.5–1.9)

## 2017-12-02 MED ORDER — ACETAMINOPHEN 160 MG/5ML PO SOLN
650.0000 mg | ORAL | Status: DC | PRN
  Administered 2017-12-02 (×2): 650 mg
  Filled 2017-12-02 (×3): qty 20.3

## 2017-12-02 MED ORDER — PANTOPRAZOLE SODIUM 40 MG PO TBEC
40.0000 mg | DELAYED_RELEASE_TABLET | Freq: Every day | ORAL | Status: DC
  Administered 2017-12-02 – 2017-12-05 (×4): 40 mg via ORAL
  Filled 2017-12-02 (×4): qty 1

## 2017-12-02 MED ORDER — FAMOTIDINE IN NACL 20-0.9 MG/50ML-% IV SOLN
20.0000 mg | Freq: Two times a day (BID) | INTRAVENOUS | Status: DC
  Administered 2017-12-02: 20 mg via INTRAVENOUS
  Filled 2017-12-02: qty 50

## 2017-12-02 MED ORDER — SODIUM CHLORIDE 0.9 % IV SOLN
1.0000 g | INTRAVENOUS | Status: DC
  Administered 2017-12-02 – 2017-12-04 (×3): 1 g via INTRAVENOUS
  Filled 2017-12-02 (×3): qty 1

## 2017-12-02 MED ORDER — ACETAMINOPHEN 325 MG PO TABS
650.0000 mg | ORAL_TABLET | ORAL | Status: DC | PRN
  Administered 2017-12-03 – 2017-12-06 (×6): 650 mg via ORAL
  Filled 2017-12-02 (×5): qty 2

## 2017-12-02 MED ORDER — LEVOTHYROXINE SODIUM 88 MCG PO TABS
88.0000 ug | ORAL_TABLET | Freq: Every day | ORAL | Status: DC
  Administered 2017-12-02 – 2017-12-06 (×5): 88 ug via ORAL
  Filled 2017-12-02 (×4): qty 1

## 2017-12-02 MED ORDER — CHLORHEXIDINE GLUCONATE CLOTH 2 % EX PADS
6.0000 | MEDICATED_PAD | Freq: Every day | CUTANEOUS | Status: DC
  Administered 2017-12-07 – 2017-12-08 (×2): 6 via TOPICAL

## 2017-12-02 MED ORDER — ACETAMINOPHEN 325 MG PO TABS
650.0000 mg | ORAL_TABLET | Freq: Once | ORAL | Status: AC
  Administered 2017-12-02: 650 mg via ORAL
  Filled 2017-12-02: qty 2

## 2017-12-02 MED ORDER — HEPARIN SODIUM (PORCINE) 5000 UNIT/ML IJ SOLN
5000.0000 [IU] | Freq: Three times a day (TID) | INTRAMUSCULAR | Status: DC
  Administered 2017-12-02: 5000 [IU] via SUBCUTANEOUS
  Filled 2017-12-02: qty 1

## 2017-12-02 MED ORDER — HEPARIN (PORCINE) IN NACL 100-0.45 UNIT/ML-% IJ SOLN
850.0000 [IU]/h | INTRAMUSCULAR | Status: DC
  Administered 2017-12-02 – 2017-12-04 (×2): 850 [IU]/h via INTRAVENOUS
  Filled 2017-12-02 (×3): qty 250

## 2017-12-02 MED ORDER — ACETAMINOPHEN 650 MG RE SUPP: 650.0000 mg | RECTAL | Status: DC | PRN

## 2017-12-02 MED ORDER — FAMOTIDINE IN NACL 20-0.9 MG/50ML-% IV SOLN: 20.0000 mg | Freq: Every day | INTRAVENOUS | Status: DC

## 2017-12-02 MED ORDER — INSULIN ASPART 100 UNIT/ML ~~LOC~~ SOLN
0.0000 [IU] | Freq: Three times a day (TID) | SUBCUTANEOUS | Status: DC
  Administered 2017-12-02: 1 [IU] via SUBCUTANEOUS
  Administered 2017-12-02: 2 [IU] via SUBCUTANEOUS
  Administered 2017-12-02: 1 [IU] via SUBCUTANEOUS

## 2017-12-02 MED ORDER — VANCOMYCIN HCL IN DEXTROSE 500-5 MG/100ML-% IV SOLN
500.0000 mg | INTRAVENOUS | Status: DC
  Administered 2017-12-03: 500 mg via INTRAVENOUS
  Filled 2017-12-02: qty 100

## 2017-12-02 MED ORDER — VANCOMYCIN HCL IN DEXTROSE 1-5 GM/200ML-% IV SOLN
1000.0000 mg | Freq: Once | INTRAVENOUS | Status: AC
  Administered 2017-12-02: 1000 mg via INTRAVENOUS
  Filled 2017-12-02: qty 200

## 2017-12-02 MED ORDER — SEVELAMER CARBONATE 800 MG PO TABS
800.0000 mg | ORAL_TABLET | Freq: Three times a day (TID) | ORAL | Status: DC
Start: 2017-12-02 — End: 2017-12-06
  Administered 2017-12-02 – 2017-12-06 (×10): 800 mg via ORAL
  Filled 2017-12-02 (×9): qty 1

## 2017-12-02 NOTE — Progress Notes (Signed)
Spoke with [name], patient's daughter.  Patient and family talked and would like to try anticoagulation with coumadin.  Plan is for 24/7 care giving and patient to be in wheelchair for safety.  Will start with heparin gtt to ensure no GI bleeding/issues with HD then bridge to coumadin. Eulogio Bear DO

## 2017-12-02 NOTE — H&P (Addendum)
History and Physical    [name] IOE:[id number] DOB: 1928/12/22 DOA: 12/01/2017  PCP: Binnie Rail, MD  Patient coming from: Home.  History was obtained from patient's daughter.  Chief Complaint: Confusion.  HPI: [name] is a 84 y.o. female with history of ESRD on hemodialysis on Monday Wednesday Friday, hypertension, diabetes mellitus, chronic systolic heart failure, hypothyroidism, atrial fibrillation on anticoagulation was brought to the ER after patient was found to be increasingly confused.  Patient daughter states that patient became confused during dialysis.  Over the last 2 days patient also was noticed to have increasing difficulty moving the right lower extremity.  Has been having frequent falls.  Patient's daughter also states that patient has been having frequent diarrhea off and on for last 3 to 4 days.  Has benign poor appetite with no abdominal pain or vomiting.  ED Course: In the ER patient was initially mildly hypotensive and lactate was 5 but afebrile.  Was given 500 cc normal saline bolus following which blood pressure improved and lactate improved.  On exam patient has significant swelling of the right lower extremity which as per the patient's daughter has been new.  On exam patient also has right lower lip any weakness with difficulty moving likely from the swelling.  CT angiogram of the chest was negative for PE CT head was unremarkable.  Patient admitted for further management of acute encephalopathy and right lower extremity swelling.  Was started on empiric antibiotics for now.  Review of Systems: As per HPI, rest all negative.   Past Medical History:  Diagnosis Date  . Arthritis   . Atrial fibrillation ([place]) 06/25/2012   2D Echo - EF 45-50, left atrium severely dilated, mitral valve heavily calcified annulus with calcification of the subvalvular apparatus - no longer on Coumadin  . Breast cancer ([place])   . CHF (congestive heart failure) ([place])   .  DM2 (diabetes mellitus, type 2) ([place])   . ESRD (end stage renal disease) ([place])    HD MWF - [facility]  . HTN (hypertension)   . Hyperlipidemia   . Hyperparathyroidism, secondary renal ([place])   . Hypothyroid   . Right tibial fracture 06/21/2012  . Shortness of breath   . Tibia/fibula fracture 07/10/2011   right - nonunion repair 06/2012    Past Surgical History:  Procedure Laterality Date  . ABDOMINAL SURGERY    . ANKLE FUSION Right   . ARTERIOVENOUS GRAFT PLACEMENT Right   . BREAST SURGERY Left    Mastectomy  . EYE SURGERY Bilateral    Cataract  . FEMUR IM NAIL Right 08/15/2016   Procedure: INTRAMEDULLARY (IM) RETROGRADE FEMORAL NAILING;  Surgeon: Renette Butters, MD;  Location: [facility];  Service: Orthopedics;  Laterality: Right;  . MASTECTOMY    . ORIF TIBIA FRACTURE Right 06/16/2012   Procedure: OPEN REDUCTION INTERNAL FIXATION (ORIF) TIBIA FRACTURE;  Surgeon: Rozanna Box, MD;  Location: [facility];  Service: Orthopedics;  Laterality: Right;  . THYROID SURGERY       reports that she has never smoked. Her smokeless tobacco use includes snuff. She reports that she does not drink alcohol or use drugs.  Allergies  Allergen Reactions  . Codeine Shortness Of Breath, Swelling and Other (See Comments)    Swelling of the tongue, but patient tolerates Vicodin  . Coreg [Carvedilol] Other (See Comments)    Headaches   . Lopressor [Metoprolol Tartrate] Other (See Comments)    GI upset  . Metoprolol Other (  See Comments)    GI upset  . Oxycodone Other (See Comments)    Hallucinations (also with Oxycontin)  . Tape Other (See Comments)    TAPE, especially PLASTIC, PULLS OFF THE SKIN!!    Family History  Problem Relation Age of Onset  . Hyperlipidemia Father   . Diabetes Father   . Breast cancer Sister     Prior to Admission medications   Medication Sig Start Date End Date Taking? Authorizing Provider  acetaminophen (TYLENOL) 325 MG tablet Take 2 tablets (650 mg total) by mouth every  6 (six) hours as needed for mild pain or moderate pain. 08/17/16  Yes Mariel Aloe, MD  aspirin EC 81 MG tablet Take 81 mg by mouth daily.   Yes [provider]  Cholecalciferol (VITAMIN D-3 PO) Take 1 capsule by mouth daily.   Yes [provider]  darbepoetin (ARANESP) 100 MCG/0.5ML SOLN Inject 0.5 mLs (100 mcg total) into the vein every Wednesday with hemodialysis. 07/25/12  Yes Kilroy, Luke K, PA-C  glipiZIDE (GLUCOTROL) 5 MG tablet Take 1 tablet by mouth at bedtime.  10/14/17  Yes [provider]  levothyroxine (SYNTHROID, LEVOTHROID) 88 MCG tablet Take 88 mcg by mouth daily before breakfast.   Yes [provider]  nebivolol (BYSTOLIC) 10 MG tablet Take 1 tablet (10 mg total) by mouth daily. 10/30/17  Yes Burns, Claudina Lick, MD  omeprazole (PRILOSEC) 20 MG capsule TAKE ONE CAPSULE BY MOUTH IN THE EVENING FOR GERD Patient taking differently: Take 20 mg by mouth daily.  12/20/16  Yes Binnie Rail, MD  aspirin EC 325 MG tablet Take 1 tablet (325 mg total) by mouth daily. For 30 days post op for DVT Prophylaxis.  Resume 81 mg Aspirin after this has been completed. Patient not taking: Reported on 12/02/2017 08/15/16   Prudencio Burly III, PA-C  b complex-vitamin c-folic acid (NEPHRO-VITE) 0.8 MG TABS tablet Take 1 tablet by mouth at bedtime. Patient not taking: Reported on 12/02/2017 08/29/15   Binnie Rail, MD  diclofenac sodium (VOLTAREN) 1 % GEL APPLY 2 GRAMS TOPICALLY EVERY SHIFT FOR PAIN Patient not taking: Reported on 12/02/2017 12/20/16   Binnie Rail, MD  doxycycline (VIBRA-TABS) 100 MG tablet Take 1 tablet (100 mg total) by mouth 2 (two) times daily. Patient not taking: Reported on 12/02/2017 10/30/17   Binnie Rail, MD  levothyroxine (SYNTHROID, LEVOTHROID) 75 MCG tablet TAKE 1 TABLET BY MOUTH ONCE DAILY FOR HYPOTHYROIDISM Patient not taking: Reported on 12/02/2017 12/20/16   Binnie Rail, MD  methocarbamol (ROBAXIN) 500 MG tablet Take 1 tablet (500 mg  total) by mouth every 6 (six) hours as needed for muscle spasms. Patient not taking: Reported on 12/02/2017 08/17/16   Mariel Aloe, MD  Nutritional Supplements (FEEDING SUPPLEMENT, NEPRO CARB STEADY,) LIQD Take 237 mLs by mouth 3 (three) times daily as needed (Supplement). Patient not taking: Reported on 12/02/2017 08/17/16   Mariel Aloe, MD  potassium chloride SA (K-DUR,KLOR-CON) 20 MEQ tablet Take 2 tablets (40 mEq total) by mouth once for 1 dose. Patient not taking: Reported on 12/02/2017 10/30/17 12/02/17  Binnie Rail, MD  pravastatin (PRAVACHOL) 20 MG tablet Take 1 tablet (20 mg total) by mouth daily. -- Office visit needed for further refills Patient not taking: Reported on 12/02/2017 09/22/17   Binnie Rail, MD  sevelamer carbonate (RENVELA) 800 MG tablet TAKE 1 TABLET BY MOUTH DAILY WITH A MEAL Patient not taking: Reported on 12/02/2017 10/16/17  Binnie Rail, MD    Physical Exam: Vitals:   12/01/17 1940 12/01/17 2226 12/02/17 0025  BP: (!) 99/45  (!) 131/51  Pulse: (!) 105  83  Resp: (!) 22  17  Temp: 97.8 F (36.6 C) 97.7 F (36.5 C)   TempSrc: Oral Rectal   SpO2: 100%  94%      Constitutional: Moderately built and nourished. Vitals:   12/01/17 1940 12/01/17 2226 12/02/17 0025  BP: (!) 99/45  (!) 131/51  Pulse: (!) 105  83  Resp: (!) 22  17  Temp: 97.8 F (36.6 C) 97.7 F (36.5 C)   TempSrc: Oral Rectal   SpO2: 100%  94%   Eyes: Anicteric no pallor. ENMT: No discharge from the ears eyes nose or mouth. Neck: No mass palpated no neck rigidity. Respiratory: No rhonchi or crepitations. Cardiovascular: S1-S2 heard no murmurs appreciated. Abdomen: Soft nontender bowel sounds present. Musculoskeletal: Right lower extremity swelling exiting from the foot to the knee. Skin: No rash. Neurologic: Alert awake oriented to name.  Moves all extremities.  Difficulty moving right lower extremity due to swelling. Psychiatric: Mildly confused.   Labs on Admission: I have  personally reviewed following labs and imaging studies  CBC: Recent Labs  Lab 12/01/17 2057  WBC 9.4  NEUTROABS 8.0*  HGB 9.1*  HCT 28.9*  MCV 87.6  PLT 161   Basic Metabolic Panel: Recent Labs  Lab 12/01/17 1955  NA 139  K 3.7  CL 99  CO2 24  GLUCOSE 148*  BUN 13  CREATININE 4.06*  CALCIUM 8.0*   GFR: CrCl cannot be calculated (Unknown ideal weight.). Liver Function Tests: Recent Labs  Lab 12/01/17 1955  AST 35  ALT 16  ALKPHOS 215*  BILITOT 0.9  PROT 5.8*  ALBUMIN 2.3*   No results for input(s): LIPASE, AMYLASE in the last 168 hours. Recent Labs  Lab 12/01/17 1955  AMMONIA 49*   Coagulation Profile: No results for input(s): INR, PROTIME in the last 168 hours. Cardiac Enzymes: No results for input(s): CKTOTAL, CKMB, CKMBINDEX, TROPONINI in the last 168 hours. BNP (last 3 results) No results for input(s): PROBNP in the last 8760 hours. HbA1C: No results for input(s): HGBA1C in the last 72 hours. CBG: No results for input(s): GLUCAP in the last 168 hours. Lipid Profile: No results for input(s): CHOL, HDL, LDLCALC, TRIG, CHOLHDL, LDLDIRECT in the last 72 hours. Thyroid Function Tests: No results for input(s): TSH, T4TOTAL, FREET4, T3FREE, THYROIDAB in the last 72 hours. Anemia Panel: No results for input(s): VITAMINB12, FOLATE, FERRITIN, TIBC, IRON, RETICCTPCT in the last 72 hours. Urine analysis:    Component Value Date/Time   COLORURINE YELLOW 06/16/2012 0717   APPEARANCEUR HAZY (A) 06/16/2012 0717   LABSPEC 1.015 06/16/2012 0717   PHURINE 6.0 06/16/2012 0717   GLUCOSEU 250 (A) 06/16/2012 0717   HGBUR SMALL (A) 06/16/2012 0717   BILIRUBINUR NEGATIVE 06/16/2012 0717   KETONESUR NEGATIVE 06/16/2012 0717   PROTEINUR 100 (A) 06/16/2012 0717   UROBILINOGEN 0.2 06/16/2012 0717   NITRITE NEGATIVE 06/16/2012 0717   LEUKOCYTESUR TRACE (A) 06/16/2012 0717   Sepsis Labs: @LABRCNTIP (procalcitonin:4,lacticidven:4) )No results found for this or any  previous visit (from the past 240 hour(s)).   Radiological Exams on Admission: Dg Chest 2 View  Result Date: 12/01/2017 CLINICAL DATA:  Confusion and disorientation. Normally alert and oriented. EXAM: CHEST - 2 VIEW COMPARISON:  10/30/2017. FINDINGS: The heart is enlarged. Calcified ascending aorta. No consolidation or edema. No effusion or pneumothorax. Hyperinflation  suggesting COPD. Osteopenia. IMPRESSION: COPD. Cardiomegaly. Thoracic atherosclerosis. No active infiltrates or failure. Improved aeration from priors. Electronically Signed   By: Staci Righter M.D.   On: 12/01/2017 20:36   Ct Head Wo Contrast  Result Date: 12/01/2017 CLINICAL DATA:  Altered mental status EXAM: CT HEAD WITHOUT CONTRAST TECHNIQUE: Contiguous axial images were obtained from the base of the skull through the vertex without intravenous contrast. COMPARISON:  None. FINDINGS: Brain: There is no mass, hemorrhage or extra-axial collection. The size and configuration of the ventricles and extra-axial CSF spaces are normal. There is no acute or chronic infarction. There is hypoattenuation of the periventricular white matter, most commonly indicating chronic ischemic microangiopathy. Vascular: No abnormal hyperdensity of the major intracranial arteries or dural venous sinuses. No intracranial atherosclerosis. Skull: The visualized skull base, calvarium and extracranial soft tissues are normal. Sinuses/Orbits: No fluid levels or advanced mucosal thickening of the visualized paranasal sinuses. No mastoid or middle ear effusion. The orbits are normal. IMPRESSION: Chronic ischemic microangiopathy without acute intracranial abnormality. Electronically Signed   By: Ulyses Jarred M.D.   On: 12/01/2017 21:57   Ct Angio Chest Pe W And/or Wo Contrast  Result Date: 12/01/2017 CLINICAL DATA:  Confusion and disorientation since Saturday. EXAM: CT ANGIOGRAPHY CHEST WITH CONTRAST TECHNIQUE: Multidetector CT imaging of the chest was performed using  the standard protocol during bolus administration of intravenous contrast. Multiplanar CT image reconstructions and MIPs were obtained to evaluate the vascular anatomy. CONTRAST:  50 cc ISOVUE-370 IOPAMIDOL (ISOVUE-370) INJECTION 76% COMPARISON:  12/02/2013 FINDINGS: Cardiovascular: The study is of quality for the evaluation of pulmonary embolism. There are no filling defects in the central, lobar, segmental or subsegmental pulmonary artery branches to suggest acute pulmonary embolism. Atherosclerosis of the great vessels with conventional branch pattern left main and three-vessel coronary arteriosclerosis. Mediastinum/Nodes: No discrete thyroid nodules. Unremarkable esophagus. No pathologically enlarged axillary, mediastinal or hilar lymph nodes. Lungs/Pleura: No pneumothorax. No pleural effusion. Redemonstration of multiple bilateral noncalcified pulmonary nodules, measuring on the order of 5 mm or less. No confluent airspace opacity. Upper abdomen: Fat containing right [place]  hernia. Musculoskeletal:  No aggressive appearing focal osseous lesions. Review of the MIP images confirms the above findings. IMPRESSION: 1. Stable cardiomegaly with aortic atherosclerosis and coronary arteriosclerosis. 2. No acute pulmonary embolus. 3. Scattered noncalcified 5 mm or less pulmonary nodules unchanged in appearance. Given long-term stability, findings likely to represent a benign etiology. Aortic Atherosclerosis (ICD10-I70.0). Electronically Signed   By: Ashley Royalty M.D.   On: 12/01/2017 22:01    EKG: Independently reviewed.  Normal sinus rhythm with nonspecific ST-T changes.  Assessment/Plan Principal Problem:   Acute encephalopathy Active Problems:   HTN (hypertension)   DM2 (diabetes mellitus, type 2) ([facility])   Anemia   Breast cancer, s/p Lt mastectomy   Chronic combined systolic and diastolic heart failure EF 45-50% by echo 06/25/2012   ESRD on dialysis ([place])   PAF (paroxysmal atrial fibrillation) ([place])    Memory loss   Hypothyroidism    1. Acute encephalopathy -patient is afebrile.  CT head unremarkable.  Since patient's daughter stated that she has been having some weakness in the right lower extremity since last 2 days which could be from the right lower extremity swelling however given the A. fib will get MRI brain to rule out stroke.  Patient was hypotensive during the dialysis prescription contributing to patient's confusion.  Ammonia is mildly elevated.  Will check repeat ammonia after giving lactulose.  There is no further diarrhea.  2. Right lower extremity swelling -could be a cellulitis or DVT.  Has been started on empiric antibiotics.  Check Dopplers to rule out DVT. 3. Diarrhea with poor appetite.  If there is further episodes of diarrhea will check stool studies.  Lactate elevation could be from the diarrhea.  Abdomen appears benign.  But patient as per the daughter was complaining of nausea.  Will check sonogram of the abdomen.  If lactate goes up will need CT abdomen. 4. Diabetes mellitus type 2 we will keep patient on sliding scale coverage. 5. Hypothyroidism on Synthroid. 6. Hypertension we will hold Bystolic due to initial presentation with hypotension. 7. Chronic systolic heart failure volume management per nephrology during dialysis. 8. History of breast cancer status post mastectomy. 9. Anemia likely from ESRD.  Follow CBC.  Note that patient stool for occult blood has been positive. 10. History of A. fib presently in sinus rhythm.  Not on Coumadin.  Constant today.  Bystolic on hold due to initial presentation of hypotension. 11. Hyperlipidemia on statins.  Note that patient stool for occult blood was positive but given the right lower extremity swelling concerning for PE and no obvious GI bleed except for stool occult being positive keep patient on heparin subcu for DVT prophylaxis.  X-ray of the pelvis is pending.   DVT prophylaxis: Heparin. Code Status: DNR. Family  Communication: Patient's daughter. Disposition Plan: Home. Consults called: None. Admission status: Observation.   Rise Patience MD [facility] Pager [phone].  If 7PM-7AM, please contact night-coverage [email or web address] Password [email or web address]  12/02/2017, 1:39 AM

## 2017-12-02 NOTE — Progress Notes (Signed)
Patient admitted after midnight, please see H&P.   Acute encephalopathy  -patient is afebrile.  CT head unremarkable.  -MRI brain negative for CVA - Patient was hypotensive during the dialysis prescription contributing to patient's confusion.  -appears back to baseline--- appropriate conversations  Right lower extremity swelling - ?cellulitis vs DVT.  Has been started on empiric antibiotics (has blistering between toes).   dopplers positive for DVT-- hesitant to start anticoagulation as she is a fall risk/has been taken off coumadin in the past for bleeding/is heme +----- may consider heparin gtt while in hospital-- discussed long term plans with daughter, [name] (CM at [facility]).  She will discuss with her brother options and if they desire a palliative care consult vs anticoagulation vs filter if candidate -dg hip shows bruise -LE U/S also suggestive of PVD but unable to get ABIs as she has DVT  Diarrhea with poor appetite.  - If there is further episodes of diarrhea will check stool studies.  Lactate elevation could be from the diarrhea.  Abdomen appears benign.  But patient as per the daughter was complaining of nausea.   -no abnormalities on U/S of abdomen  ESRD -on HD -suspect will be here >2 midnights so will consult renal for HD  Diabetes mellitus type 2  -sliding scale coverage.  Hypothyroidism  -Synthroid.  Hypertension  -resume Bystolic when able, held due to hypotension.  Chronic systolic heart failure volume management per nephrology during dialysis.  History of breast cancer status post mastectomy.  Anemia of CD likely from ESRD.  Follow CBC.  Note that patient stool for occult blood was positive.  History of A. fib presently in sinus rhythm.  Not on Coumadin.  Bystolic on hold due to initial presentation of hypotension.  Hyperlipidemia on statins.  Eulogio Bear DO

## 2017-12-02 NOTE — Progress Notes (Signed)
PT Cancellation Note  Patient Details Name: [name] MRN: [id number] DOB: Mar 11, 1929   Cancelled Treatment:    Reason Eval/Treat Not Completed: Medical issues which prohibited therapy  Currently awaiting results of doppler due to concern of DVT. Will follow and attempt to return if time/schedule allow when patient is medically ready and safe to participate in activity.    Deniece Ree PT, DPT, CBIS  Supplemental Physical Therapist [facility]    Pager [phone] Acute Rehab Office [phone]

## 2017-12-02 NOTE — Progress Notes (Signed)
Pharmacy Antibiotic Note  [name] is a 84 y.o. female admitted on 12/01/2017 with cellulitis RLE.  Pharmacy has been consulted for Vancomycin and cefepime dosing. ESRD on HD qMWF.  Ceftriaxone 1g IV x1 given in ED  Weight 53 kg (10/2017 MD office visit record)  Goal: preHD Vanc level: 15-25 mcg/ml  Plan: Cefepime 1g IV q24h  Vancomycin 1000 mg IV x1 then 500mg  qHD qMWF Monitor clinical status, renal function and culture results daily.  Vancomycin pre HD level per protocol.    Temp (24hrs), Avg:97.8 F (36.6 C), Min:97.7 F (36.5 C), Max:97.8 F (36.6 C)  Recent Labs  Lab 12/01/17 1955 12/01/17 2003 12/01/17 2057 12/02/17 0041  WBC  --   --  9.4  --   CREATININE 4.06*  --   --   --   LATICACIDVEN  --  5.75*  --  2.41*    CrCl cannot be calculated (Unknown ideal weight.).    Allergies  Allergen Reactions  . Codeine Shortness Of Breath, Swelling and Other (See Comments)    Swelling of the tongue, but patient tolerates Vicodin  . Coreg [Carvedilol] Other (See Comments)    Headaches   . Lopressor [Metoprolol Tartrate] Other (See Comments)    GI upset  . Metoprolol Other (See Comments)    GI upset  . Oxycodone Other (See Comments)    Hallucinations (also with Oxycontin)  . Tape Other (See Comments)    TAPE, especially PLASTIC, PULLS OFF THE SKIN!!    Thank you for allowing pharmacy to be a part of this patient's care.  Nicole Cella, RPh Clinical Pharmacist Please check AMION for all [facility] phone numbers After 10:00 PM, call [facility] [phone] 12/02/2017 1:40 AM

## 2017-12-02 NOTE — ED Notes (Signed)
Blood culture collected prior to administering antibiotics

## 2017-12-02 NOTE — Plan of Care (Signed)
  Problem: Safety: Goal: Ability to remain free from injury will improve 12/02/2017 0800 by Barton Dubois, RN Outcome: Progressing 12/02/2017 0800 by Barton Dubois, RN Outcome: Progressing

## 2017-12-02 NOTE — ED Notes (Signed)
Patient transported to MRI 

## 2017-12-02 NOTE — ED Notes (Signed)
Pt in MRI.

## 2017-12-02 NOTE — Progress Notes (Addendum)
[facility] for heparin Indication: DVT  Heparin Dosing Weight: 52.7 kg  Labs: Recent Labs    12/01/17 1955 12/01/17 2057 12/02/17 0648  HGB  --  9.1* 8.7*  HCT  --  28.9* 27.7*  PLT  --  171 174  CREATININE 4.06*  --  4.40*    Assessment: 51 yof with hx of ESRD on HD, afib not on anticoagulation PTA with new RLE DVT, chronic LLE DVT. Pharmacy consulted to dose heparin. Noted FOBT+ and reportedly has been taken off warfarin in the past due to bleeding issues. MRI negative for CVA. Per MD note, plan is to try warfarin but starting heparin first to ensure no bleeding or issues with HD. Hg down to 8.7, plt wnl. No active bleeding reported. Received prophylactic heparin SQ dose 9/17 at 1513  Goal of Therapy:  Heparin level 0.3-0.7 units/ml Monitor platelets by anticoagulation protocol: Yes   Plan:  D/c heparin SQ No bolus due to advanced age, bleed risk, and recent heparin SQ dose. Start heparin at 850 units/hr 8h heparin level Monitor daily heparin level and CBC, s/sx bleeding F/u long-term anticoagulation plan  Elicia Lamp, PharmD, BCPS Clinical Pharmacist 12/02/2017 3:58 PM

## 2017-12-02 NOTE — Progress Notes (Signed)
OT Cancellation Note  Patient Details Name: [name] MRN: [id number] DOB: 1928/05/31   Cancelled Treatment:    Reason Eval/Treat Not Completed: Medical issues which prohibited therapy Reason Eval/Treat Not Completed: Medical issues which prohibited therapy  Currently awaiting results of doppler due to concern of DVT. Will follow and attempt to return if time/schedule allow when patient is medically ready and safe to participate in activity.    Jeri Modena, OTR/L  Acute Rehabilitation Services Pager: [phone] Office: [phone] .  Parke Poisson B 12/02/2017, 8:39 AM

## 2017-12-02 NOTE — ED Notes (Signed)
Dr Christy Gentles informed of lactic acid results 2.41

## 2017-12-02 NOTE — Consult Note (Addendum)
[facility] Renal Consultation Note    Indication for Consultation:  Management of ESRD/hemodialysis, anemia, hypertension/volume, and secondary hyperparathyroidism. PCP:  HPI: [name] is a 84 y.o. female with ESRD, Type 2 DM, Hx breast cancer, A-fib (not on anticoagulation), HTN who was admitted with AMS and acute RLE DVT.  [name] will wake and answer simple questions, but not enough for a full history. Per notes, she was brought to ED after being noted to have AMS during dialysis on 9/16. She had previously been complaining about RLE pain which her family had attributed to multiple falls at home. Also, with + diarrhea. In the ED, labs showed K 3.7, Glu 148, Ca 8, Alb 2.3, Trop 0.03, LA 2.8, WBC 9.4, Hgb 9.1, plts 171. BCx collected, pending. She was started empirically on abx to cover for RLE cellulitis. Head CT and CT angio of chest were negative, brain MRI negative, abd u/s negative. FOBT was + ; no overt blood present. LE ultrasound confirmed acute RLE DVT and chronic LLE DVT. She denies CP, dyspnea at this time - unable to get a full ROS due to current mental status.  Dialyzes MWF schedule at [facility] - s/p full HD on 9/16.  Past Medical History:  Diagnosis Date  . Arthritis   . Atrial fibrillation ([place]) 06/25/2012   2D Echo - EF 45-50, left atrium severely dilated, mitral valve heavily calcified annulus with calcification of the subvalvular apparatus - no longer on Coumadin  . Breast cancer ([place])   . CHF (congestive heart failure) ([place])   . DM2 (diabetes mellitus, type 2) ([place])   . ESRD (end stage renal disease) ([place])    HD MWF - [facility]  . HTN (hypertension)   . Hyperlipidemia   . Hyperparathyroidism, secondary renal ([place])   . Hypothyroid   . Right tibial fracture 06/21/2012  . Shortness of breath   . Tibia/fibula fracture 07/10/2011   right - nonunion repair 06/2012   Past Surgical History:  Procedure Laterality Date  . ABDOMINAL SURGERY     . ANKLE FUSION Right   . ARTERIOVENOUS GRAFT PLACEMENT Right   . BREAST SURGERY Left    Mastectomy  . EYE SURGERY Bilateral    Cataract  . FEMUR IM NAIL Right 08/15/2016   Procedure: INTRAMEDULLARY (IM) RETROGRADE FEMORAL NAILING;  Surgeon: [name] Butters, MD;  Location: [place];  Service: Orthopedics;  Laterality: Right;  . MASTECTOMY    . ORIF TIBIA FRACTURE Right 06/16/2012   Procedure: OPEN REDUCTION INTERNAL FIXATION (ORIF) TIBIA FRACTURE;  Surgeon: [name] Box, MD;  Location: [place];  Service: Orthopedics;  Laterality: Right;  . THYROID SURGERY     Family History  Problem Relation Age of Onset  . Hyperlipidemia Father   . Diabetes Father   . Breast cancer Sister    Social History:  reports that she has never smoked. Her smokeless tobacco use includes snuff. She reports that she does not drink alcohol or use drugs.  ROS: Unable to obtain full ROS other than noted in HPI.  Physical Exam: Vitals:   12/02/17 0340 12/02/17 0359 12/02/17 1142 12/02/17 1220  BP: (!) 139/51 (!) 122/95 (!) 124/45   Pulse: 74 72 66   Resp: 18 18 15 18   Temp: 98 F (36.7 C) 98.3 F (36.8 C) 98.7 F (37.1 C)   TempSrc: Oral Oral Oral   SpO2:   (!) 51% 92%  Weight:  52.7 kg    Height:  5\' 1"  (  1.549 m)       General: Frail, elderly female, slouched over in bed. Will awake given short answers to my questions, but quickly falls back asleep. Head: Normocephalic, atraumatic. Neck: Supple without lymphadenopathy/masses. JVD not elevated. Lungs: Clear bilaterally to auscultation without wheezes, rales, or rhonchi.  Heart: RRR ; 2/6 systolic murmur Abdomen: Soft, non-tender, non-distended with normoactive bowel sounds.  Lower extremities: 1+ RLE; no open wounds visible. Neuro: Will wake to questions, falling back asleep quickly. Dialysis Access: RUE AVF + bruit  Allergies  Allergen Reactions  . Codeine Shortness Of Breath, Swelling and Other (See Comments)    Swelling of the tongue, but  patient tolerates Vicodin  . Coreg [Carvedilol] Other (See Comments)    Headaches   . Lopressor [Metoprolol Tartrate] Other (See Comments)    GI upset  . Metoprolol Other (See Comments)    GI upset  . Oxycodone Other (See Comments)    Hallucinations (also with Oxycontin)  . Tape Other (See Comments)    TAPE, especially PLASTIC, PULLS OFF THE SKIN!!   Prior to Admission medications   Medication Sig Start Date End Date Taking? Authorizing Provider  acetaminophen (TYLENOL) 325 MG tablet Take 2 tablets (650 mg total) by mouth every 6 (six) hours as needed for mild pain or moderate pain. 08/17/16  Yes Mariel Aloe, MD  aspirin EC 81 MG tablet Take 81 mg by mouth daily.   Yes [provider]  Cholecalciferol (VITAMIN D-3 PO) Take 1 capsule by mouth daily.   Yes [provider]  darbepoetin (ARANESP) 100 MCG/0.5ML SOLN Inject 0.5 mLs (100 mcg total) into the vein every Wednesday with hemodialysis. 07/25/12  Yes Kilroy, Luke K, PA-C  glipiZIDE (GLUCOTROL) 5 MG tablet Take 1 tablet by mouth at bedtime.  10/14/17  Yes [provider]  levothyroxine (SYNTHROID, LEVOTHROID) 88 MCG tablet Take 88 mcg by mouth daily before breakfast.   Yes [provider]  nebivolol (BYSTOLIC) 10 MG tablet Take 1 tablet (10 mg total) by mouth daily. 10/30/17  Yes Burns, Claudina Lick, MD  omeprazole (PRILOSEC) 20 MG capsule TAKE ONE CAPSULE BY MOUTH IN THE EVENING FOR GERD Patient taking differently: Take 20 mg by mouth daily.  12/20/16  Yes Binnie Rail, MD  aspirin EC 325 MG tablet Take 1 tablet (325 mg total) by mouth daily. For 30 days post op for DVT Prophylaxis.  Resume 81 mg Aspirin after this has been completed. Patient not taking: Reported on 12/02/2017 08/15/16   [name] Burly III, PA-C  b complex-vitamin c-folic acid (NEPHRO-VITE) 0.8 MG TABS tablet Take 1 tablet by mouth at bedtime. Patient not taking: Reported on 12/02/2017 08/29/15   Binnie Rail, MD  diclofenac sodium  (VOLTAREN) 1 % GEL APPLY 2 GRAMS TOPICALLY EVERY SHIFT FOR PAIN Patient not taking: Reported on 12/02/2017 12/20/16   Binnie Rail, MD  doxycycline (VIBRA-TABS) 100 MG tablet Take 1 tablet (100 mg total) by mouth 2 (two) times daily. Patient not taking: Reported on 12/02/2017 10/30/17   Binnie Rail, MD  levothyroxine (SYNTHROID, LEVOTHROID) 75 MCG tablet TAKE 1 TABLET BY MOUTH ONCE DAILY FOR HYPOTHYROIDISM Patient not taking: Reported on 12/02/2017 12/20/16   Binnie Rail, MD  methocarbamol (ROBAXIN) 500 MG tablet Take 1 tablet (500 mg total) by mouth every 6 (six) hours as needed for muscle spasms. Patient not taking: Reported on 12/02/2017 08/17/16   Mariel Aloe, MD  Nutritional Supplements (FEEDING SUPPLEMENT, NEPRO CARB STEADY,) LIQD Take  237 mLs by mouth 3 (three) times daily as needed (Supplement). Patient not taking: Reported on 12/02/2017 08/17/16   Mariel Aloe, MD  potassium chloride SA (K-DUR,KLOR-CON) 20 MEQ tablet Take 2 tablets (40 mEq total) by mouth once for 1 dose. Patient not taking: Reported on 12/02/2017 10/30/17 12/02/17  Binnie Rail, MD  pravastatin (PRAVACHOL) 20 MG tablet Take 1 tablet (20 mg total) by mouth daily. -- Office visit needed for further refills Patient not taking: Reported on 12/02/2017 09/22/17   Binnie Rail, MD  sevelamer carbonate (RENVELA) 800 MG tablet TAKE 1 TABLET BY MOUTH DAILY WITH A MEAL Patient not taking: Reported on 12/02/2017 10/16/17   Binnie Rail, MD   Current Facility-Administered Medications  Medication Dose Route Frequency Provider Last Rate Last Dose  . acetaminophen (TYLENOL) tablet 650 mg  650 mg Oral Q4H PRN Rise Patience, MD       Or  . acetaminophen (TYLENOL) solution 650 mg  650 mg Per Tube Q4H PRN Rise Patience, MD   650 mg at 12/02/17 0857   Or  . acetaminophen (TYLENOL) suppository 650 mg  650 mg Rectal Q4H PRN Rise Patience, MD      . ceFEPIme (MAXIPIME) 1 g in sodium chloride 0.9 % 100 mL IVPB  1 g  Intravenous Q24H Rise Patience, MD 200 mL/hr at 12/02/17 0605 1 g at 12/02/17 0605  . famotidine (PEPCID) IVPB 20 mg premix  20 mg Intravenous Q12H Rise Patience, MD 100 mL/hr at 12/02/17 1218 20 mg at 12/02/17 1218  . heparin injection 5,000 Units  5,000 Units Subcutaneous Q8H Rise Patience, MD      . insulin aspart (novoLOG) injection 0-9 Units  0-9 Units Subcutaneous TID WC Rise Patience, MD   1 Units at 12/02/17 1218  . levothyroxine (SYNTHROID, LEVOTHROID) tablet 88 mcg  88 mcg Oral QAC breakfast Rise Patience, MD   88 mcg at 12/02/17 0857  . pantoprazole (PROTONIX) EC tablet 40 mg  40 mg Oral Daily Rise Patience, MD   40 mg at 12/02/17 1044  . sevelamer carbonate (RENVELA) tablet 800 mg  800 mg Oral TID WC Rise Patience, MD   800 mg at 12/02/17 1044  . [START ON 12/03/2017] vancomycin (VANCOCIN) IVPB 500 mg/100 ml premix  500 mg Intravenous Q M,W,F-HD Rise Patience, MD       Labs: Basic Metabolic Panel: Recent Labs  Lab 12/01/17 1955 12/02/17 0648  NA 139  --   K 3.7  --   CL 99  --   CO2 24  --   GLUCOSE 148*  --   BUN 13  --   CREATININE 4.06* 4.40*  CALCIUM 8.0*  --    Liver Function Tests: Recent Labs  Lab 12/01/17 1955  AST 35  ALT 16  ALKPHOS 215*  BILITOT 0.9  PROT 5.8*  ALBUMIN 2.3*   Recent Labs  Lab 12/01/17 1955  AMMONIA 49*   CBC: Recent Labs  Lab 12/01/17 2057 12/02/17 0648  WBC 9.4 9.0  NEUTROABS 8.0*  --   HGB 9.1* 8.7*  HCT 28.9* 27.7*  MCV 87.6 88.5  PLT 171 174   CBG: Recent Labs  Lab 12/02/17 0410 12/02/17 0532 12/02/17 0820 12/02/17 1134  GLUCAP 66* 112* 129* 146*   Studies/Results: Dg Chest 2 View  Result Date: 12/01/2017 CLINICAL DATA:  Confusion and disorientation. Normally alert and oriented. EXAM: CHEST - 2 VIEW COMPARISON:  10/30/2017. FINDINGS: The heart is enlarged. Calcified ascending aorta. No consolidation or edema. No effusion or pneumothorax. Hyperinflation  suggesting COPD. Osteopenia. IMPRESSION: COPD. Cardiomegaly. Thoracic atherosclerosis. No active infiltrates or failure. Improved aeration from priors. Electronically Signed   By: Staci Righter M.D.   On: 12/01/2017 20:36   Ct Head Wo Contrast  Result Date: 12/01/2017 CLINICAL DATA:  Altered mental status EXAM: CT HEAD WITHOUT CONTRAST TECHNIQUE: Contiguous axial images were obtained from the base of the skull through the vertex without intravenous contrast. COMPARISON:  None. FINDINGS: Brain: There is no mass, hemorrhage or extra-axial collection. The size and configuration of the ventricles and extra-axial CSF spaces are normal. There is no acute or chronic infarction. There is hypoattenuation of the periventricular white matter, most commonly indicating chronic ischemic microangiopathy. Vascular: No abnormal hyperdensity of the major intracranial arteries or dural venous sinuses. No intracranial atherosclerosis. Skull: The visualized skull base, calvarium and extracranial soft tissues are normal. Sinuses/Orbits: No fluid levels or advanced mucosal thickening of the visualized paranasal sinuses. No mastoid or middle ear effusion. The orbits are normal. IMPRESSION: Chronic ischemic microangiopathy without acute intracranial abnormality. Electronically Signed   By: Ulyses Jarred M.D.   On: 12/01/2017 21:57   Ct Angio Chest Pe W And/or Wo Contrast  Result Date: 12/01/2017 CLINICAL DATA:  Confusion and disorientation since Saturday. EXAM: CT ANGIOGRAPHY CHEST WITH CONTRAST TECHNIQUE: Multidetector CT imaging of the chest was performed using the standard protocol during bolus administration of intravenous contrast. Multiplanar CT image reconstructions and MIPs were obtained to evaluate the vascular anatomy. CONTRAST:  50 cc ISOVUE-370 IOPAMIDOL (ISOVUE-370) INJECTION 76% COMPARISON:  12/02/2013 FINDINGS: Cardiovascular: The study is of quality for the evaluation of pulmonary embolism. There are no filling  defects in the central, lobar, segmental or subsegmental pulmonary artery branches to suggest acute pulmonary embolism. Atherosclerosis of the great vessels with conventional branch pattern left main and three-vessel coronary arteriosclerosis. Mediastinum/Nodes: No discrete thyroid nodules. Unremarkable esophagus. No pathologically enlarged axillary, mediastinal or hilar lymph nodes. Lungs/Pleura: No pneumothorax. No pleural effusion. Redemonstration of multiple bilateral noncalcified pulmonary nodules, measuring on the order of 5 mm or less. No confluent airspace opacity. Upper abdomen: Fat containing right [place]chdalek  hernia. Musculoskeletal:  No aggressive appearing focal osseous lesions. Review of the MIP images confirms the above findings. IMPRESSION: 1. Stable cardiomegaly with aortic atherosclerosis and coronary arteriosclerosis. 2. No acute pulmonary embolus. 3. Scattered noncalcified 5 mm or less pulmonary nodules unchanged in appearance. Given long-term stability, findings likely to represent a benign etiology. Aortic Atherosclerosis (ICD10-I70.0). Electronically Signed   By: Ashley Royalty M.D.   On: 12/01/2017 22:01   Mr Brain Wo Contrast  Result Date: 12/02/2017 CLINICAL DATA:  Initial evaluation for acute altered mental status, increased confusion. EXAM: MRI HEAD WITHOUT CONTRAST TECHNIQUE: Multiplanar, multiecho pulse sequences of the brain and surrounding structures were obtained without intravenous contrast. COMPARISON:  Prior CT from 12/01/2017. FINDINGS: Brain: Generalized age-related cerebral atrophy. Patchy T2/FLAIR hyperintensity within the periventricular and deep white matter both cerebral hemispheres most consistent with chronic small vessel ischemic disease, moderate nature. No abnormal foci of restricted diffusion to suggest acute or subacute ischemia. Gray-white matter differentiation maintained. No areas of remote cortical infarction. No acute intracranial hemorrhage. Few punctate  chronic micro hemorrhages noted within the left occipital region. No mass lesion, midline shift or mass effect. No hydrocephalus. No extra-axial fluid collection. Pituitary gland normal. Vascular: Major intracranial vascular flow voids maintained. Diminutive vertebrobasilar system noted. Skull and upper cervical spine:  Craniocervical junction normal. Upper cervical spine within normal limits. Bone marrow signal intensity normal. No scalp soft tissue abnormality. Sinuses/Orbits: Globes and orbital soft tissues within normal limits. Patient status post ocular lens replacement bilaterally. Paranasal sinuses are clear. Small right mastoid effusion, of doubtful significance. Inner ear structures normal. Other: None. IMPRESSION: 1. No acute intracranial abnormality. 2. Age-related cerebral atrophy with moderate chronic small vessel ischemic disease. Electronically Signed   By: Jeannine Boga M.D.   On: 12/02/2017 03:07   US Abdomen Complete  Result Date: 12/02/2017 CLINICAL DATA:  Initial evaluation for acute nausea. EXAM: ABDOMEN ULTRASOUND COMPLETE COMPARISON:  None. FINDINGS: Gallbladder: No gallstones or wall thickening visualized. No sonographic Murphy sign noted by sonographer. Common bile duct: Diameter: 7.2 mm Liver: No focal lesion identified. Within normal limits in parenchymal echogenicity. Portal vein is patent on color Doppler imaging with normal direction of blood flow towards the liver. IVC: No abnormality visualized. Pancreas: Visualized portion unremarkable. Spleen: Size and appearance within normal limits. Right Kidney: Length: 6.1 cm. Atrophic with diffuse cortical thinning. Diffusely increased echogenicity. No hydronephrosis. 1.0 x 1.2 x 1.1 cm simple cyst noted. Left Kidney: Length: 5.5 cm. Atrophic with diffuse cortical thinning. Diffusely increased echogenicity. No mass or hydronephrosis visualized. Abdominal aorta: No aneurysm visualized. Other findings: None. IMPRESSION: 1. No  sonographic evidence for acute abnormality within the abdomen. 2. Atrophic kidneys with associated cortical thinning and diffusely increased echogenicity within the renal parenchyma, compatible with chronic medical renal disease. No hydronephrosis. 3. 1.2 cm simple right renal cyst. Electronically Signed   By: Jeannine Boga M.D.   On: 12/02/2017 03:50   Dg Pelvis Portable  Result Date: 12/02/2017 CLINICAL DATA:  Bilateral hip pain after a fall EXAM: PORTABLE PELVIS 1-2 VIEWS COMPARISON:  08/14/2016 FINDINGS: Diffuse bone demineralization. Degenerative changes in the lower lumbar spine and in both hips. No evidence of acute fracture or dislocation of the right hip. There is infiltration in the subcutaneous fat lateral to the right hip likely indicating subcutaneous contusion or hematoma. No dislocation of the right hip. An intramedullary rod is partially identified in the proximal femoral shaft. Calcifications in the pelvis consistent with phleboliths. Additional calcifications may represent fibroids are calcified lymph nodes. Vascular calcifications. IMPRESSION: No acute bony abnormalities. Probable contusion or hematoma in the soft tissues lateral to the right hip. Electronically Signed   By: Lucienne Capers M.D.   On: 12/02/2017 06:56    Dialysis Orders:  MWF at [facility] 3:30, 350/600, EDW 54.5kg, 4K/2.25Ca, AVF, no heparin\ - S/p recent f'gram 9/12. - Mircera 120mcg IV q 2 wks (last 9/4), Venofer 50mg  IV weekly - Calcitriol 0.24mcg PO q HD  Assessment/Plan: 1.  AMS: Still present. Negative head CT and MRI. 2.  Acute RLE DVT (and chronic LLE DVT): Fall risk, + FOBT. Looks like decision being made with family on whether to anticoagulate v. place IVC filter v. nothing. 3.  Diarrhea: Better per hospitalist note. Follow. 4.  ESRD: Usual MWF schedule, will plan on HD tomorrow (9/18). No heparin. 5.  Hypertension/volume: BP controlled. No pulm edema. Below outpt EDW - low goal. 6.   Anemia: Hgb 8.7; will re-dose ESA with next HD. 7.  Metabolic bone disease: Ca ok, will follow labs. 8.  Nutrition: Alb low, add pro-stat supps. 9.  Type 2 DM 10.   Hx A-fib  Veneta Penton, Hershal Coria 12/02/2017, 1:45 PM  [facility] Pager: [phone]  Pt seen, examined and agree w A/P as above.  Kelly Splinter MD [employer] pager [phone]   12/02/2017, 5:01 PM

## 2017-12-02 NOTE — Care Management Note (Addendum)
Case Management Note  Patient Details  Name: ELEAH LAHAIE MRN: [id number] Date of Birth: [birth year]  Subjective/Objective:  Acute Encephalopathy                 Action/Plan: Patient lives at home with her daughter / Therapist, sports at [facility]; PCP: Binnie Rail, MD; has private insurance with Medicare; CM received call from patient's daughter Reginia Forts , she stated that she plans to take the patient home at discharge, no SNF placement and wants [facility] services provided by [facility]; [place] with [facility] called for arrangements  Expected Discharge Date:     Possibly 12/07/2017             Expected Discharge Plan:  [place]  In-House Referral:     Discharge planning Services  CM Consult  :    Choice offered to:  Adult Children HH Arranged:  RN, Disease Management, PT, OT, Nurse's Aide HH Agency:  [facility]  Status of Service:  In process, will continue to follow  Sherrilyn Rist [phone] 12/02/2017, 1:33 PM

## 2017-12-02 NOTE — Progress Notes (Signed)
*  Preliminary Results* Bilateral lower extremity venous duplex completed. Right lower extremity is positive for acute deep vein thrombosis involving the right posterior tibial and peroneal veins. The left lower extremity is positive for chronic deep vein thrombosis involving the left peroneal veins. There is no evidence of Baker's cyst bilaterally.  Incidental finding: evidence of peripheral vascular disease in bilateral lower extremities. No evidence of ischemic condition. Given bilateral lower extremity DVT, ABIs will not be performed.  Preliminary results discussed with Dr. Eliseo Squires.  12/02/2017 9:37 AM Maudry Mayhew, MHA, RVT, RDCS, RDMS

## 2017-12-02 NOTE — Plan of Care (Signed)
  Problem: Safety: Goal: Ability to remain free from injury will improve Outcome: Progressing   

## 2017-12-02 NOTE — Progress Notes (Signed)
PT Cancellation Note  Patient Details Name: [name] MRN: [id number] DOB: 10-02-1928   Cancelled Treatment:    Reason Eval/Treat Not Completed: Medical issues which prohibited therapy patient positive for DVT per doppler. Currently awaiting initiation of anticoagulants or other medical intervention for clots. Will attempt to return when patient is medically appropriate and safe to participate in PT.    Deniece Ree PT, DPT, CBIS  Supplemental Physical Therapist [facility]    Pager [phone] Acute Rehab Office [phone]

## 2017-12-03 ENCOUNTER — Encounter: Payer: Self-pay | Admitting: *Deleted

## 2017-12-03 DIAGNOSIS — I480 Paroxysmal atrial fibrillation: Secondary | ICD-10-CM

## 2017-12-03 DIAGNOSIS — G9340 Encephalopathy, unspecified: Principal | ICD-10-CM

## 2017-12-03 DIAGNOSIS — Z992 Dependence on renal dialysis: Secondary | ICD-10-CM

## 2017-12-03 DIAGNOSIS — D631 Anemia in chronic kidney disease: Secondary | ICD-10-CM

## 2017-12-03 DIAGNOSIS — I82401 Acute embolism and thrombosis of unspecified deep veins of right lower extremity: Secondary | ICD-10-CM

## 2017-12-03 DIAGNOSIS — N186 End stage renal disease: Secondary | ICD-10-CM

## 2017-12-03 LAB — CBC
HEMATOCRIT: 24.4 % — AB (ref 36.0–46.0)
HEMOGLOBIN: 7.8 g/dL — AB (ref 12.0–15.0)
MCH: 27.6 pg (ref 26.0–34.0)
MCHC: 32 g/dL (ref 30.0–36.0)
MCV: 86.2 fL (ref 78.0–100.0)
Platelets: 210 10*3/uL (ref 150–400)
RBC: 2.83 MIL/uL — AB (ref 3.87–5.11)
RDW: 18.9 % — ABNORMAL HIGH (ref 11.5–15.5)
WBC: 9.1 10*3/uL (ref 4.0–10.5)

## 2017-12-03 LAB — RENAL FUNCTION PANEL
Albumin: 1.7 g/dL — ABNORMAL LOW (ref 3.5–5.0)
Anion gap: 13 (ref 5–15)
BUN: 20 mg/dL (ref 8–23)
CO2: 26 mmol/L (ref 22–32)
CREATININE: 5.41 mg/dL — AB (ref 0.44–1.00)
Calcium: 7.9 mg/dL — ABNORMAL LOW (ref 8.9–10.3)
Chloride: 100 mmol/L (ref 98–111)
GFR calc Af Amer: 7 mL/min — ABNORMAL LOW (ref >60)
GFR, EST NON AFRICAN AMERICAN: 6 mL/min — AB (ref >60)
Glucose, Bld: 65 mg/dL — ABNORMAL LOW (ref 70–99)
POTASSIUM: 3.8 mmol/L (ref 3.5–5.1)
Phosphorus: 3.5 mg/dL (ref 2.5–4.6)
Sodium: 139 mmol/L (ref 135–145)

## 2017-12-03 LAB — HEPATITIS B SURFACE ANTIGEN: HEP B S AG: NEGATIVE

## 2017-12-03 LAB — GLUCOSE, CAPILLARY
GLUCOSE-CAPILLARY: 71 mg/dL (ref 70–99)
GLUCOSE-CAPILLARY: 23 mg/dL — AB (ref 70–99)
GLUCOSE-CAPILLARY: 112 mg/dL — AB (ref 70–99)
GLUCOSE-CAPILLARY: 44 mg/dL — AB (ref 70–99)
GLUCOSE-CAPILLARY: 178 mg/dL — AB (ref 70–99)
GLUCOSE-CAPILLARY: 215 mg/dL — AB (ref 70–99)
Glucose-Capillary: 260 mg/dL — ABNORMAL HIGH (ref 70–99)

## 2017-12-03 LAB — HEPARIN LEVEL (UNFRACTIONATED)
HEPARIN UNFRACTIONATED: 0.64 [IU]/mL (ref 0.30–0.70)
HEPARIN UNFRACTIONATED: 0.67 [IU]/mL (ref 0.30–0.70)
HEPARIN UNFRACTIONATED: 0.67 [IU]/mL (ref 0.30–0.70)

## 2017-12-03 MED ORDER — DARBEPOETIN ALFA 200 MCG/0.4ML IJ SOSY
PREFILLED_SYRINGE | INTRAMUSCULAR | Status: AC
  Filled 2017-12-03: qty 0.4

## 2017-12-03 MED ORDER — WARFARIN SODIUM 2 MG PO TABS
4.0000 mg | ORAL_TABLET | Freq: Once | ORAL | Status: AC
  Administered 2017-12-03: 4 mg via ORAL
  Filled 2017-12-03: qty 2

## 2017-12-03 MED ORDER — PRO-STAT SUGAR FREE PO LIQD
30.0000 mL | Freq: Two times a day (BID) | ORAL | Status: DC
  Administered 2017-12-03 – 2017-12-05 (×3): 30 mL via ORAL
  Filled 2017-12-03 (×5): qty 30

## 2017-12-03 MED ORDER — DARBEPOETIN ALFA 200 MCG/0.4ML IJ SOSY
200.0000 ug | PREFILLED_SYRINGE | INTRAMUSCULAR | Status: DC
  Administered 2017-12-03: 200 ug via INTRAVENOUS

## 2017-12-03 MED ORDER — VANCOMYCIN HCL IN DEXTROSE 500-5 MG/100ML-% IV SOLN
INTRAVENOUS | Status: AC
  Filled 2017-12-03: qty 100

## 2017-12-03 MED ORDER — FAMOTIDINE 20 MG PO TABS
20.0000 mg | ORAL_TABLET | Freq: Every day | ORAL | Status: DC
  Administered 2017-12-03 – 2017-12-05 (×3): 20 mg via ORAL
  Filled 2017-12-03 (×3): qty 1

## 2017-12-03 MED ORDER — WARFARIN - PHARMACIST DOSING INPATIENT
Freq: Every day | Status: DC
  Administered 2017-12-04: 17:00:00

## 2017-12-03 MED ORDER — LOPERAMIDE HCL 2 MG PO CAPS
2.0000 mg | ORAL_CAPSULE | Freq: Three times a day (TID) | ORAL | Status: DC | PRN
  Administered 2017-12-03 – 2017-12-06 (×2): 2 mg via ORAL
  Filled 2017-12-03 (×2): qty 1

## 2017-12-03 MED ORDER — DEXTROSE 50 % IV SOLN
25.0000 mL | Freq: Once | INTRAVENOUS | Status: AC
  Administered 2017-12-03: 25 mL via INTRAVENOUS

## 2017-12-03 MED ORDER — DEXTROSE 50 % IV SOLN
INTRAVENOUS | Status: AC
  Administered 2017-12-03: 13:00:00
  Filled 2017-12-03: qty 50

## 2017-12-03 MED ORDER — ACETAMINOPHEN 325 MG PO TABS
ORAL_TABLET | ORAL | Status: AC
  Filled 2017-12-03: qty 2

## 2017-12-03 NOTE — Plan of Care (Signed)
  Problem: Elimination: Goal: Will not experience complications related to bowel motility Outcome: Progressing   Problem: Safety: Goal: Ability to remain free from injury will improve Outcome: Progressing   

## 2017-12-03 NOTE — Progress Notes (Signed)
[facility] Progress Note   Subjective:  Seen on HD. 1.5L UF goal, tolerating. On heparin drip for DVT. Awake and more talkative this morning - not quite to baseline, but more clear than yesterday. No CP/dyspnea.   Objective Vitals:   12/03/17 0809 12/03/17 0830 12/03/17 0900 12/03/17 0930  BP: (!) 130/47 (!) 153/68 (!) 140/47 (!) 101/44  Pulse: 68 70 72 70  Resp: 16 18 16    Temp:      TempSrc:      SpO2:      Weight:      Height:       Physical Exam General: Frail female, awake & more alert this morning. Heart: RRR; 2/6 systolic murmur Lungs: CTA anteriorly Abdomen: soft, non-tender Extremities: 1+ RLE edema with scattered hyperpigmented areas, no LLE edema Dialysis Access: R AVF + bruit  Additional Objective Labs: Basic Metabolic Panel: Recent Labs  Lab 12/01/17 1955 12/02/17 0648 12/03/17 0830  NA 139  --  139  K 3.7  --  3.8  CL 99  --  100  CO2 24  --  26  GLUCOSE 148*  --  65*  BUN 13  --  20  CREATININE 4.06* 4.40* 5.41*  CALCIUM 8.0*  --  7.9*  PHOS  --   --  3.5   Liver Function Tests: Recent Labs  Lab 12/01/17 1955 12/03/17 0830  AST 35  --   ALT 16  --   ALKPHOS 215*  --   BILITOT 0.9  --   PROT 5.8*  --   ALBUMIN 2.3* 1.7*   CBC: Recent Labs  Lab 12/01/17 2057 12/02/17 0648 12/03/17 0600  WBC 9.4 9.0 9.1  NEUTROABS 8.0*  --   --   HGB 9.1* 8.7* 7.8*  HCT 28.9* 27.7* 24.4*  MCV 87.6 88.5 86.2  PLT 171 174 210   Blood Culture    Component Value Date/Time   SDES BLOOD LEFT HAND 12/02/2017 0640   SPECREQUEST  12/02/2017 0640    BOTTLES DRAWN AEROBIC ONLY Blood Culture adequate volume   CULT  12/02/2017 0640    NO GROWTH < 24 HOURS Performed at [facility] [address], [place], [place] [address]    REPTSTATUS PENDING 12/02/2017 5409   Studies/Results: Dg Chest 2 View  Result Date: 12/01/2017 CLINICAL DATA:  Confusion and disorientation. Normally alert and oriented. EXAM: CHEST - 2 VIEW COMPARISON:   10/30/2017. FINDINGS: The heart is enlarged. Calcified ascending aorta. No consolidation or edema. No effusion or pneumothorax. Hyperinflation suggesting COPD. Osteopenia. IMPRESSION: COPD. Cardiomegaly. Thoracic atherosclerosis. No active infiltrates or failure. Improved aeration from priors. Electronically Signed   By: Staci Righter M.D.   On: 12/01/2017 20:36   Ct Head Wo Contrast  Result Date: 12/01/2017 CLINICAL DATA:  Altered mental status EXAM: CT HEAD WITHOUT CONTRAST TECHNIQUE: Contiguous axial images were obtained from the base of the skull through the vertex without intravenous contrast. COMPARISON:  None. FINDINGS: Brain: There is no mass, hemorrhage or extra-axial collection. The size and configuration of the ventricles and extra-axial CSF spaces are normal. There is no acute or chronic infarction. There is hypoattenuation of the periventricular white matter, most commonly indicating chronic ischemic microangiopathy. Vascular: No abnormal hyperdensity of the major intracranial arteries or dural venous sinuses. No intracranial atherosclerosis. Skull: The visualized skull base, calvarium and extracranial soft tissues are normal. Sinuses/Orbits: No fluid levels or advanced mucosal thickening of the visualized paranasal sinuses. No mastoid or middle ear effusion.  The orbits are normal. IMPRESSION: Chronic ischemic microangiopathy without acute intracranial abnormality. Electronically Signed   By: Ulyses Jarred M.D.   On: 12/01/2017 21:57   Ct Angio Chest Pe W And/or Wo Contrast  Result Date: 12/01/2017 CLINICAL DATA:  Confusion and disorientation since Saturday. EXAM: CT ANGIOGRAPHY CHEST WITH CONTRAST TECHNIQUE: Multidetector CT imaging of the chest was performed using the standard protocol during bolus administration of intravenous contrast. Multiplanar CT image reconstructions and MIPs were obtained to evaluate the vascular anatomy. CONTRAST:  50 cc ISOVUE-370 IOPAMIDOL (ISOVUE-370) INJECTION 76%  COMPARISON:  12/02/2013 FINDINGS: Cardiovascular: The study is of quality for the evaluation of pulmonary embolism. There are no filling defects in the central, lobar, segmental or subsegmental pulmonary artery branches to suggest acute pulmonary embolism. Atherosclerosis of the great vessels with conventional branch pattern left main and three-vessel coronary arteriosclerosis. Mediastinum/Nodes: No discrete thyroid nodules. Unremarkable esophagus. No pathologically enlarged axillary, mediastinal or hilar lymph nodes. Lungs/Pleura: No pneumothorax. No pleural effusion. Redemonstration of multiple bilateral noncalcified pulmonary nodules, measuring on the order of 5 mm or less. No confluent airspace opacity. Upper abdomen: Fat containing right Bochdalek  hernia. Musculoskeletal:  No aggressive appearing focal osseous lesions. Review of the MIP images confirms the above findings. IMPRESSION: 1. Stable cardiomegaly with aortic atherosclerosis and coronary arteriosclerosis. 2. No acute pulmonary embolus. 3. Scattered noncalcified 5 mm or less pulmonary nodules unchanged in appearance. Given long-term stability, findings likely to represent a benign etiology. Aortic Atherosclerosis (ICD10-I70.0). Electronically Signed   By: Ashley Royalty M.D.   On: 12/01/2017 22:01   Mr Brain Wo Contrast  Result Date: 12/02/2017 CLINICAL DATA:  Initial evaluation for acute altered mental status, increased confusion. EXAM: MRI HEAD WITHOUT CONTRAST TECHNIQUE: Multiplanar, multiecho pulse sequences of the brain and surrounding structures were obtained without intravenous contrast. COMPARISON:  Prior CT from 12/01/2017. FINDINGS: Brain: Generalized age-related cerebral atrophy. Patchy T2/FLAIR hyperintensity within the periventricular and deep white matter both cerebral hemispheres most consistent with chronic small vessel ischemic disease, moderate nature. No abnormal foci of restricted diffusion to suggest acute or subacute ischemia.  Gray-white matter differentiation maintained. No areas of remote cortical infarction. No acute intracranial hemorrhage. Few punctate chronic micro hemorrhages noted within the left occipital region. No mass lesion, midline shift or mass effect. No hydrocephalus. No extra-axial fluid collection. Pituitary gland normal. Vascular: Major intracranial vascular flow voids maintained. Diminutive vertebrobasilar system noted. Skull and upper cervical spine: Craniocervical junction normal. Upper cervical spine within normal limits. Bone marrow signal intensity normal. No scalp soft tissue abnormality. Sinuses/Orbits: Globes and orbital soft tissues within normal limits. Patient status post ocular lens replacement bilaterally. Paranasal sinuses are clear. Small right mastoid effusion, of doubtful significance. Inner ear structures normal. Other: None. IMPRESSION: 1. No acute intracranial abnormality. 2. Age-related cerebral atrophy with moderate chronic small vessel ischemic disease. Electronically Signed   By: Jeannine Boga M.D.   On: 12/02/2017 03:07   US Abdomen Complete  Result Date: 12/02/2017 CLINICAL DATA:  Initial evaluation for acute nausea. EXAM: ABDOMEN ULTRASOUND COMPLETE COMPARISON:  None. FINDINGS: Gallbladder: No gallstones or wall thickening visualized. No sonographic Murphy sign noted by sonographer. Common bile duct: Diameter: 7.2 mm Liver: No focal lesion identified. Within normal limits in parenchymal echogenicity. Portal vein is patent on color Doppler imaging with normal direction of blood flow towards the liver. IVC: No abnormality visualized. Pancreas: Visualized portion unremarkable. Spleen: Size and appearance within normal limits. Right Kidney: Length: 6.1 cm. Atrophic with diffuse cortical thinning. Diffusely increased  echogenicity. No hydronephrosis. 1.0 x 1.2 x 1.1 cm simple cyst noted. Left Kidney: Length: 5.5 cm. Atrophic with diffuse cortical thinning. Diffusely increased  echogenicity. No mass or hydronephrosis visualized. Abdominal aorta: No aneurysm visualized. Other findings: None. IMPRESSION: 1. No sonographic evidence for acute abnormality within the abdomen. 2. Atrophic kidneys with associated cortical thinning and diffusely increased echogenicity within the renal parenchyma, compatible with chronic medical renal disease. No hydronephrosis. 3. 1.2 cm simple right renal cyst. Electronically Signed   By: Jeannine Boga M.D.   On: 12/02/2017 03:50   Dg Pelvis Portable  Result Date: 12/02/2017 CLINICAL DATA:  Bilateral hip pain after a fall EXAM: PORTABLE PELVIS 1-2 VIEWS COMPARISON:  08/14/2016 FINDINGS: Diffuse bone demineralization. Degenerative changes in the lower lumbar spine and in both hips. No evidence of acute fracture or dislocation of the right hip. There is infiltration in the subcutaneous fat lateral to the right hip likely indicating subcutaneous contusion or hematoma. No dislocation of the right hip. An intramedullary rod is partially identified in the proximal femoral shaft. Calcifications in the pelvis consistent with phleboliths. Additional calcifications may represent fibroids are calcified lymph nodes. Vascular calcifications. IMPRESSION: No acute bony abnormalities. Probable contusion or hematoma in the soft tissues lateral to the right hip. Electronically Signed   By: Lucienne Capers M.D.   On: 12/02/2017 06:56   Medications: . ceFEPime (MAXIPIME) IV 1 g (12/03/17 0329)  . heparin 850 Units/hr (12/02/17 1657)   . Chlorhexidine Gluconate Cloth  6 each Topical Q0600  . famotidine  20 mg Oral Daily  . insulin aspart  0-9 Units Subcutaneous TID WC  . levothyroxine  88 mcg Oral QAC breakfast  . pantoprazole  40 mg Oral Daily  . sevelamer carbonate  800 mg Oral TID WC  . vancomycin  500 mg Intravenous Q M,W,F-HD    Dialysis Orders: MWF at [facility] 3:30, 350/600, EDW 54.5kg, 4K/2.25Ca, AVF, no heparin - S/p recent f'gram  9/12. - Mircera 112mcg IV q 2 wks (last 9/4), Venofer 50mg  IV weekly - Calcitriol 0.55mcg PO q HD  Assessment/Plan: 1.  AMS: Improving. Negative head CT and MRI. 2.  Acute RLE DVT (and chronic LLE DVT): Fall risk, + FOBT. Family has decided to try anticoagulation; on heparin drip now. 3.  Diarrhea: Better per hospitalist note. Follow. 4.  ESRD: Usual MWF schedule, HD today. No heparin. 5.  Hypertension/volume: BP controlled. No pulm edema. Below outpt EDW - low goal. 6.  Anemia: Hgb 7.8 - has dropped overnight; will re-dose Aranesp today (227mcg) 7.  Metabolic bone disease: Ca/Phos ok, will follow labs. 8.  Nutrition: Alb low, adding pro-stat supps. 9.  Type 2 DM 10.   Hx A-fib  Veneta Penton, Hershal Coria 12/03/2017, 9:46 AM  [facility] Pager: [phone]

## 2017-12-03 NOTE — Plan of Care (Signed)
  Problem: Safety: Goal: Ability to remain free from injury will improve Outcome: Progressing   Problem: Elimination: Goal: Will not experience complications related to bowel motility Outcome: Not Progressing  Pt. Having multiple loose stools, MD aware

## 2017-12-03 NOTE — Progress Notes (Signed)
Paged MD about 5th diarrhea episode and PRN order.  Alisea Matte, RN

## 2017-12-03 NOTE — Progress Notes (Signed)
Imodium given  Malaney Mcbean, RN

## 2017-12-03 NOTE — Progress Notes (Signed)
Daughter stated that she is concerned and that patient is acting unusual. VS are checked and writhing normal range. Patient calm, denies pain.  Will contintinue to monitor.   Gerrett Loman, RN

## 2017-12-03 NOTE — Progress Notes (Signed)
Refusing lunch. Encouraged granddaughter to help with assisting with lunch.  Milena Liggett, RN

## 2017-12-03 NOTE — Progress Notes (Signed)
ANTICOAGULATION CONSULT NOTE  Pharmacy Consult:  Coumadin Indication: DVT  Allergies  Allergen Reactions  . Codeine Shortness Of Breath, Swelling and Other (See Comments)    Swelling of the tongue, but patient tolerates Vicodin  . Coreg [Carvedilol] Other (See Comments)    Headaches   . Lopressor [Metoprolol Tartrate] Other (See Comments)    GI upset  . Metoprolol Other (See Comments)    GI upset  . Oxycodone Other (See Comments)    Hallucinations (also with Oxycontin)  . Tape Other (See Comments)    TAPE, especially PLASTIC, PULLS OFF THE SKIN!!    Patient Measurements: Height: 5\' 1"  (154.9 cm) Weight: 113 lb 12.1 oz (51.6 kg) IBW/kg (Calculated) : 47.8  Vital Signs: Temp: 97.7 F (36.5 C) (09/18 1230) Temp Source: Oral (09/18 1230) BP: 129/91 (09/18 1250) Pulse Rate: 71 (09/18 1250)  Labs: Recent Labs    12/01/17 1955  12/01/17 2057 12/02/17 0648 12/03/17 0111 12/03/17 0600 12/03/17 0731 12/03/17 0830  HGB  --    < > 9.1* 8.7*  --  7.8*  --   --   HCT  --   --  28.9* 27.7*  --  24.4*  --   --   PLT  --   --  171 174  --  210  --   --   HEPARINUNFRC  --   --   --   --  0.64 0.67 0.67  --   CREATININE 4.06*  --   --  4.40*  --   --   --  5.41*   < > = values in this interval not displayed.    Estimated Creatinine Clearance: 5.5 mL/min (A) (by C-G formula based on SCr of 5.41 mg/dL (H)).  Assessment: 63 YOF with history of Afib taken off of Coumadin due to bleeding from fistula and fall risk.  Now with acute DVTs and patient to restart Coumadin.  She is also on IV heparin bridge.  No recent INR but expect it to be at baseline.  INR from 2018 was 0.99.  FOB positive but no overt bleeding; transfuse if hemoglobin drops further per MD.  Albumin low.   Goal of Therapy:  INR 2 - 3     Plan:  Coumadin 4mg  PO today Daily PT / INR   Tranice Laduke D. Mina Marble, PharmD, BCPS, [place] 12/03/2017, 2:33 PM

## 2017-12-03 NOTE — Progress Notes (Signed)
PT Cancellation Note  Patient Details Name: [name] MRN: [id number] DOB: January 11, 1929   Cancelled Treatment:    Reason Eval/Treat Not Completed: Patient at procedure or test/unavailable   Reinaldo Berber, PT, DPT [facility] Pager: [phone] Office: [phone]     Reinaldo Berber 12/03/2017, 9:19 AM

## 2017-12-03 NOTE — Progress Notes (Signed)
Hypoglycemic Event   CBG: 23  Treatment: 15 GM carbohydrate snack  Symptoms: None , patient at baseline  Follow up CBG: 30   Treatment: 15 GM carbohydrate snack  Follow up CBG Result:44  Treatment: 50% Dextrose 1/2 amp since patient refused to continue to drink or eat.   Follow up: 112  Possible Reasons for Event: Unknown  Comments/MD notified:Hongalgi MD notified    Chelsae Zanella

## 2017-12-03 NOTE — Progress Notes (Addendum)
PROGRESS NOTE   [name]  RKY:[id number]    DOB: [birth year]    DOA: 12/01/2017  PCP: Binnie Rail, MD   I have briefly reviewed patients previous medical records in [facility].  Brief Narrative:  84 year old female with PMH of ESRD on MWF HD, HTN, DM 2, HLD, chronic systolic CHF, hypothyroid, A. fib on anticoagulation, progressively declining for the last couple of months, was brought to the ED 12/02/2017 after she became increasingly confused during HD, 2 days history of increasing difficulty moving right lower extremity, having frequent falls, on and off diarrhea for 3 to 4 days and poor appetite.  In ED, mildly hypotensive with elevated lactate, improved after IV saline bolus, noted new right lower extremity swelling.  CTA chest was negative for PE, CT head unremarkable.  Admitted for acute encephalopathy, right lower extremity swelling which is now diagnosed with DVT.  After extensive discussion with daughter, initiated IV heparin drip and Coumadin bridging.  Nephrology following for dialysis needs.   Assessment & Plan:   Principal Problem:   Acute encephalopathy Active Problems:   HTN (hypertension)   DM2 (diabetes mellitus, type 2) (HCC)   Anemia   Breast cancer, s/p Lt mastectomy   Chronic combined systolic and diastolic heart failure EF 45-50% by echo 06/25/2012   ESRD on dialysis ([place])   PAF (paroxysmal atrial fibrillation) (HCC)   Memory loss   Hypothyroidism   Acute right lower extremity DVT/Chronic LLE DVT: Initially felt to have cellulitis and had been empirically started on antibiotics.  Lower extremity venous Dopplers 9/17 showed acute DVT involving the right posterior tibial vein and right peroneal vein and chronic DVT in the left peroneal vein.  She is deemed to be high risk patient for bleeding complications given advanced age, overall physical and mental decline over the last couple of months, fall risk, FOBT +, has been taken off Coumadin in the past for  bleeding.  This was discussed in detail by my partner Dr. Eliseo Squires on 9/17 and by me on 9/18 regarding options of aggressive management with anticoagulation with IV heparin bridge and Coumadin versus IVC filter.  Patient's daughter who is a case Freight forwarder at [employer] long has decided in favor of IV heparin drip and Coumadin.  She hopes that patient will improve enough to take home and be able to continue her outpatient dialysis.  However if patient unable to participate in outpatient dialysis, daughter is considering hospice.  She declines palliative care consultation at this time and indicates that she is well aware of what the palliative care team is going to say and she is well educated to make decisions by herself.  Started IV heparin 9/17.  Started Coumadin per pharmacy 9/18.  Right lower extremity appears slightly warm and??  Erythematous but not convinced that this is cellulitis.  Patient currently is on vancomycin and cefepime, consider follow-up and stopping them in a.m.  Acute encephalopathy: Unclear etiology.  CT head and MRI brain without acute abnormalities.  No obvious infectious etiology identified.  As per discussion with nephrology service, mental status is somewhat better today.  Monitor closely.  Delirium precautions.  TSH normal 8/15.  Diarrhea. chronic: Unclear etiology.  Ultrasound abdomen without acute findings.  Abdomen benign on exam. PRN Imodium.   ESRD on MWF HD: Nephrology following.  Seen at dialysis on 9/18.  Type II DM with hypoglycemia: Most likely complicated by poor oral intake.  Discontinue all insulins but continue to monitor CBGs closely and treat  hypoglycemia per protocol.  If CBGs consistently elevated >180 then consider customized SSI.  A1c 5.1 on 8/15.  Hypothyroid: Continue Synthroid.  Essential hypertension: Currently controlled off of medicines.  Diastolic had been held due to hypotension.  Monitor for now.  Chronic systolic CHF: Volume management across HD.  Does  not appear overtly volume overloaded.  History of breast cancer status post mastectomy.  Anemia of ESRD: Hemoglobin has gradually dropped from 9.1 > 8.7 > 7.8.  FOBT + but no overt bleeding.  Monitor closely while on anticoagulation.  Consider blood transfusion if hemoglobin drops further.  History of A. fib: Currently in sinus rhythm.  Bystolic on hold due to initial hypotension, consider restarting if remains normotensive or if blood pressures start to increase in the next 24 hours.  Was not on anticoagulation PTA, now on anticoagulation for DVT.  Hyperlipidemia: Statins.  Adult failure to thrive: Multifactorial due to very advanced age, multiple severe significant comorbidities and suspect some element of dementia.  Discussion as above.  Pulmonary nodules: Seen on CTA chest and reportedly stable.  Given her overall failure to thrive, probably no need for follow-up.   DVT prophylaxis: IV heparin bridge + Coumadin per pharmacy. Code Status: DNR Family Communication: Cussed in detail with patient's daughter who is a case Freight forwarder at [employer].  Updated care and answered questions. Disposition: To be determined.  Daughter hopes that patient will improve enough for her to go home and continue outpatient HD.  PT requested.   Consultants:  Nephrology.  Procedures:  HD.  Antimicrobials:  Cefepime.   Subjective: Patient seen at HD this morning.  Alert and oriented only to self.  States that she is in "[place]".  Poor historian and unable to participate in history.  Denies pain however.  As per family, ongoing poor oral intake.  ROS: Unable due to mental status changes.  Objective:  Vitals:   12/03/17 1100 12/03/17 1139 12/03/17 1230 12/03/17 1250  BP: (!) 104/50 (!) 122/40 (!) 121/39 (!) 129/91  Pulse: 78 78 72 71  Resp: 18 16 16 16   Temp:  98.4 F (36.9 C) 97.7 F (36.5 C)   TempSrc:  Oral Oral   SpO2: 100% 100%  100%  Weight:      Height:         Examination:  General exam: Pleasant elderly female, small built, frail and chronically ill looking, lying comfortably in bed undergoing HD, fidgety at times.  Does not appear in any distress. Respiratory system: Poor inspiratory effort.  Slightly diminished breath sounds in the bases but otherwise clear to auscultation. Respiratory effort normal. Cardiovascular system: S1 & S2 heard, RRR. No JVD, murmurs, rubs, gallops or clicks. No pedal edema.  Telemetry personally reviewed: Sinus rhythm. Gastrointestinal system: Abdomen is nondistended, soft and nontender. No organomegaly or masses felt. Normal bowel sounds heard. Central nervous system: Alert and oriented only to self.  Does not follow simple instructions consistently. No focal neurological deficits. Extremities: Symmetric 5 x 5 power.  Right upper arm AV fistula.  Right lower extremity mildly swollen asymmetrically compared to the left with increased warmth and??  Faint patchy redness.  No tenderness. Skin: No rashes, lesions or ulcers Psychiatry: Judgement and insight impaired. Mood & affect appropriate.     Data Reviewed: I have personally reviewed following labs and imaging studies  CBC: Recent Labs  Lab 12/01/17 2057 12/02/17 0648 12/03/17 0600  WBC 9.4 9.0 9.1  NEUTROABS 8.0*  --   --  HGB 9.1* 8.7* 7.8*  HCT 28.9* 27.7* 24.4*  MCV 87.6 88.5 86.2  PLT 171 174 035   Basic Metabolic Panel: Recent Labs  Lab 12/01/17 1955 12/02/17 0648 12/03/17 0830  NA 139  --  139  K 3.7  --  3.8  CL 99  --  100  CO2 24  --  26  GLUCOSE 148*  --  65*  BUN 13  --  20  CREATININE 4.06* 4.40* 5.41*  CALCIUM 8.0*  --  7.9*  PHOS  --   --  3.5   Liver Function Tests: Recent Labs  Lab 12/01/17 1955 12/03/17 0830  AST 35  --   ALT 16  --   ALKPHOS 215*  --   BILITOT 0.9  --   PROT 5.8*  --   ALBUMIN 2.3* 1.7*   CBG: Recent Labs  Lab 12/03/17 0743 12/03/17 1226 12/03/17 1234 12/03/17 1241 12/03/17 1305  GLUCAP  71 23* 30* 44* 112*    Recent Results (from the past 240 hour(s))  Culture, blood (routine x 2)     Status: None (Preliminary result)   Collection Time: 12/02/17 12:32 AM  Result Value Ref Range Status   Specimen Description BLOOD LEFT ANTECUBITAL  Final   Special Requests   Final    BOTTLES DRAWN AEROBIC AND ANAEROBIC Blood Culture results may not be optimal due to an inadequate volume of blood received in culture bottles   Culture  Setup Time   Final    GRAM POSITIVE RODS ANAEROBIC BOTTLE ONLY CRITICAL RESULT CALLED TO, READ BACK BY AND VERIFIED WITHShellee Milo PHARMD [id number] 12/03/17 A BROWNING    Culture   Final    NO GROWTH 1 DAY Performed at [facility], [place] [address] [place], [place] [address]    Report Status PENDING  Incomplete  MRSA PCR Screening     Status: None   Collection Time: 12/02/17  4:38 AM  Result Value Ref Range Status   MRSA by PCR NEGATIVE NEGATIVE Final    Comment:        The GeneXpert MRSA Assay (FDA approved for NASAL specimens only), is one component of a comprehensive MRSA colonization surveillance program. It is not intended to diagnose MRSA infection nor to guide or monitor treatment for MRSA infections. Performed at [facility], [place] [address] [place], [place] [address]   Culture, blood (routine x 2)     Status: None (Preliminary result)   Collection Time: 12/02/17  6:40 AM  Result Value Ref Range Status   Specimen Description BLOOD LEFT HAND  Final   Special Requests   Final    BOTTLES DRAWN AEROBIC ONLY Blood Culture adequate volume   Culture   Final    NO GROWTH < 24 HOURS Performed at [facility], [place] [address], [place], [place] [address]    Report Status PENDING  Incomplete         Radiology Studies: Dg Chest 2 View  Result Date: 12/01/2017 CLINICAL DATA:  Confusion and disorientation. Normally alert and oriented. EXAM: CHEST - 2 VIEW COMPARISON:  10/30/2017. FINDINGS: The heart is enlarged. Calcified  ascending aorta. No consolidation or edema. No effusion or pneumothorax. Hyperinflation suggesting COPD. Osteopenia. IMPRESSION: COPD. Cardiomegaly. Thoracic atherosclerosis. No active infiltrates or failure. Improved aeration from priors. Electronically Signed   By: Staci Righter M.D.   On: 12/01/2017 20:36   Ct Head Wo Contrast  Result Date: 12/01/2017 CLINICAL DATA:  Altered mental status EXAM: CT HEAD WITHOUT CONTRAST TECHNIQUE: Contiguous axial images were obtained from the base of the skull through the vertex without intravenous contrast. COMPARISON:  None. FINDINGS: Brain: There is no mass, hemorrhage or extra-axial collection. The size and configuration of the ventricles and extra-axial CSF spaces are normal. There is no acute or chronic infarction. There is hypoattenuation of the periventricular white matter, most commonly indicating chronic ischemic microangiopathy. Vascular: No abnormal hyperdensity of the major intracranial arteries or dural venous sinuses. No intracranial atherosclerosis. Skull: The visualized skull base, calvarium and extracranial soft tissues are normal. Sinuses/Orbits: No fluid levels or advanced mucosal thickening of the visualized paranasal sinuses. No mastoid or middle ear effusion. The orbits are normal. IMPRESSION: Chronic ischemic microangiopathy without acute intracranial abnormality. Electronically Signed   By: Ulyses Jarred M.D.   On: 12/01/2017 21:57   Ct Angio Chest Pe W And/or Wo Contrast  Result Date: 12/01/2017 CLINICAL DATA:  Confusion and disorientation since Saturday. EXAM: CT ANGIOGRAPHY CHEST WITH CONTRAST TECHNIQUE: Multidetector CT imaging of the chest was performed using the standard protocol during bolus administration of intravenous contrast. Multiplanar CT image reconstructions and MIPs were obtained to evaluate the vascular anatomy. CONTRAST:  50 cc ISOVUE-370 IOPAMIDOL (ISOVUE-370) INJECTION 76% COMPARISON:  12/02/2013 FINDINGS: Cardiovascular: The  study is of quality for the evaluation of pulmonary embolism. There are no filling defects in the central, lobar, segmental or subsegmental pulmonary artery branches to suggest acute pulmonary embolism. Atherosclerosis of the great vessels with conventional branch pattern left main and three-vessel coronary arteriosclerosis. Mediastinum/Nodes: No discrete thyroid nodules. Unremarkable esophagus. No pathologically enlarged axillary, mediastinal or hilar lymph nodes. Lungs/Pleura: No pneumothorax. No pleural effusion. Redemonstration of multiple bilateral noncalcified pulmonary nodules, measuring on the order of 5 mm or less. No confluent airspace opacity. Upper abdomen: Fat containing right [place]  hernia. Musculoskeletal:  No aggressive appearing focal osseous lesions. Review of the MIP images confirms the above findings. IMPRESSION: 1. Stable cardiomegaly with aortic atherosclerosis and coronary arteriosclerosis. 2. No acute pulmonary embolus. 3. Scattered noncalcified 5 mm or less pulmonary nodules unchanged in appearance. Given long-term stability, findings likely to represent a benign etiology. Aortic Atherosclerosis (ICD10-I70.0). Electronically Signed   By: Ashley Royalty M.D.   On: 12/01/2017 22:01   Mr Brain Wo Contrast  Result Date: 12/02/2017 CLINICAL DATA:  Initial evaluation for acute altered mental status, increased confusion. EXAM: MRI HEAD WITHOUT CONTRAST TECHNIQUE: Multiplanar, multiecho pulse sequences of the brain and surrounding structures were obtained without intravenous contrast. COMPARISON:  Prior CT from 12/01/2017. FINDINGS: Brain: Generalized age-related cerebral atrophy. Patchy T2/FLAIR hyperintensity within the periventricular and deep white matter both cerebral hemispheres most consistent with chronic small vessel ischemic disease, moderate nature. No abnormal foci of restricted diffusion to suggest acute or subacute ischemia. Gray-white matter differentiation maintained. No areas  of remote cortical infarction. No acute intracranial hemorrhage. Few punctate chronic micro hemorrhages noted within the left occipital region. No mass lesion, midline shift or mass effect. No hydrocephalus. No extra-axial fluid collection. Pituitary gland normal. Vascular: Major intracranial vascular flow voids maintained. Diminutive vertebrobasilar system noted. Skull and upper cervical spine: Craniocervical junction normal. Upper cervical spine within normal limits. Bone marrow signal intensity normal. No scalp soft tissue abnormality. Sinuses/Orbits: Globes and orbital soft tissues within normal limits. Patient status post ocular lens replacement bilaterally. Paranasal sinuses are clear. Small right mastoid effusion, of doubtful significance. Inner ear structures normal. Other: None. IMPRESSION: 1. No acute intracranial abnormality. 2. Age-related cerebral atrophy with  moderate chronic small vessel ischemic disease. Electronically Signed   By: Jeannine Boga M.D.   On: 12/02/2017 03:07   US Abdomen Complete  Result Date: 12/02/2017 CLINICAL DATA:  Initial evaluation for acute nausea. EXAM: ABDOMEN ULTRASOUND COMPLETE COMPARISON:  None. FINDINGS: Gallbladder: No gallstones or wall thickening visualized. No sonographic Murphy sign noted by sonographer. Common bile duct: Diameter: 7.2 mm Liver: No focal lesion identified. Within normal limits in parenchymal echogenicity. Portal vein is patent on color Doppler imaging with normal direction of blood flow towards the liver. IVC: No abnormality visualized. Pancreas: Visualized portion unremarkable. Spleen: Size and appearance within normal limits. Right Kidney: Length: 6.1 cm. Atrophic with diffuse cortical thinning. Diffusely increased echogenicity. No hydronephrosis. 1.0 x 1.2 x 1.1 cm simple cyst noted. Left Kidney: Length: 5.5 cm. Atrophic with diffuse cortical thinning. Diffusely increased echogenicity. No mass or hydronephrosis visualized. Abdominal  aorta: No aneurysm visualized. Other findings: None. IMPRESSION: 1. No sonographic evidence for acute abnormality within the abdomen. 2. Atrophic kidneys with associated cortical thinning and diffusely increased echogenicity within the renal parenchyma, compatible with chronic medical renal disease. No hydronephrosis. 3. 1.2 cm simple right renal cyst. Electronically Signed   By: Jeannine Boga M.D.   On: 12/02/2017 03:50   Dg Pelvis Portable  Result Date: 12/02/2017 CLINICAL DATA:  Bilateral hip pain after a fall EXAM: PORTABLE PELVIS 1-2 VIEWS COMPARISON:  08/14/2016 FINDINGS: Diffuse bone demineralization. Degenerative changes in the lower lumbar spine and in both hips. No evidence of acute fracture or dislocation of the right hip. There is infiltration in the subcutaneous fat lateral to the right hip likely indicating subcutaneous contusion or hematoma. No dislocation of the right hip. An intramedullary rod is partially identified in the proximal femoral shaft. Calcifications in the pelvis consistent with phleboliths. Additional calcifications may represent fibroids are calcified lymph nodes. Vascular calcifications. IMPRESSION: No acute bony abnormalities. Probable contusion or hematoma in the soft tissues lateral to the right hip. Electronically Signed   By: Lucienne Capers M.D.   On: 12/02/2017 06:56        Scheduled Meds: . Chlorhexidine Gluconate Cloth  6 each Topical Q0600  . darbepoetin (ARANESP) injection - DIALYSIS  200 mcg Intravenous Q Wed-HD  . famotidine  20 mg Oral Daily  . feeding supplement (PRO-STAT SUGAR FREE 64)  30 mL Oral BID  . insulin aspart  0-9 Units Subcutaneous TID WC  . levothyroxine  88 mcg Oral QAC breakfast  . pantoprazole  40 mg Oral Daily  . sevelamer carbonate  800 mg Oral TID WC  . vancomycin  500 mg Intravenous Q M,W,F-HD   Continuous Infusions: . ceFEPime (MAXIPIME) IV 1 g (12/03/17 0329)  . heparin 850 Units/hr (12/02/17 1657)     LOS: 1 day      Vernell Leep, MD, FACP, [facility]. [facility] Pager [phone]  If 7PM-7AM, please contact night-coverage [email or web address] Password [email or web address] 12/03/2017, 2:02 PM

## 2017-12-03 NOTE — Congregational Nurse Program (Signed)
[id number]/YOVZCH Davis,BSN,RN3,CCM:Visited patient in the facility [facility].   Was admitted with bilateral dvt's of the lower extremities and hypoglycemia.  Daughter had noticed that patient seems confused and has not been eating well over the last several weeks. Patient is confused to time and place does recognize family members,  Did recognize myself.  Patient is a renal patient on dialysis.  Visited with her for a short period of time.  Needs are being meet and she is in good spirits and condition.  Name and number given to family for any needs.

## 2017-12-03 NOTE — Progress Notes (Signed)
[place] for heparin Indication: DVT  Heparin Dosing Weight: 52.7 kg  Labs: Recent Labs    12/01/17 1955 12/01/17 2057 12/02/17 0648 12/03/17 0111  HGB  --  9.1* 8.7*  --   HCT  --  28.9* 27.7*  --   PLT  --  171 174  --   HEPARINUNFRC  --   --   --  0.64  CREATININE 4.06*  --  4.40*  --     Assessment: 84 y.o. female with DVT for heparin  Goal of Therapy:  Heparin level 0.3-0.7 units/ml Monitor platelets by anticoagulation protocol: Yes   Plan:  Continue Heparin at current rate  Follow-up am labs.  Phillis Knack, PharmD, BCPS  12/03/2017 1:49 AM

## 2017-12-03 NOTE — Progress Notes (Signed)
Patient was taken to HD during the shift change. CBG 71 in the morning. No medication given. Patient came back from HD with CBG 23. Final result 112.

## 2017-12-03 NOTE — Progress Notes (Signed)
Daughter requesting diet to be changed to Regular diet since patient is not eating. Paged MD. MD Hongagi A, gave verbal order with read back for regular diet. Order placed.  Dennette Faulconer, RN

## 2017-12-03 NOTE — Progress Notes (Signed)
ANTICOAGULATION CONSULT NOTE - Follow Up Consult  Pharmacy Consult for Heparin Indication: DVT  Allergies  Allergen Reactions  . Codeine Shortness Of Breath, Swelling and Other (See Comments)    Swelling of the tongue, but patient tolerates Vicodin  . Coreg [Carvedilol] Other (See Comments)    Headaches   . Lopressor [Metoprolol Tartrate] Other (See Comments)    GI upset  . Metoprolol Other (See Comments)    GI upset  . Oxycodone Other (See Comments)    Hallucinations (also with Oxycontin)  . Tape Other (See Comments)    TAPE, especially PLASTIC, PULLS OFF THE SKIN!!    Patient Measurements: Height: 5\' 1"  (154.9 cm) Weight: 113 lb 12.1 oz (51.6 kg) IBW/kg (Calculated) : 47.8 Heparin Dosing Weight:  51.6 kg  Vital Signs: Temp: 98.4 F (36.9 C) (09/18 1139) Temp Source: Oral (09/18 1139) BP: 122/40 (09/18 1139) Pulse Rate: 78 (09/18 1139)  Labs: Recent Labs    12/01/17 1955  12/01/17 2057 12/02/17 0648 12/03/17 0111 12/03/17 0600 12/03/17 0731 12/03/17 0830  HGB  --    < > 9.1* 8.7*  --  7.8*  --   --   HCT  --   --  28.9* 27.7*  --  24.4*  --   --   PLT  --   --  171 174  --  210  --   --   HEPARINUNFRC  --   --   --   --  0.64 0.67 0.67  --   CREATININE 4.06*  --   --  4.40*  --   --   --  5.41*   < > = values in this interval not displayed.    Estimated Creatinine Clearance: 5.5 mL/min (A) (by C-G formula based on SCr of 5.41 mg/dL (H)).  Assessment:  Anticoag: h/o afib, Ddimer 5.04. Doppler + R DVT. LLE + chronic DVT. stool for occult blood was positive. H/o Coumadin; stopped due to bleeding from fistula, +falls. IV heparin with HL 0.64, Hgb down this AM to 7.8! Confirmatory HL 0.67.Plan Coumadin  Goal of Therapy:  Heparin level 0.3-0.7 units/ml Monitor platelets by anticoagulation protocol: Yes   Plan:  Heparin 850 units/hr. Heparin level and CBC daily  Braden Deloach S. Alford Highland, PharmD, [place] Clinical Staff Pharmacist [phone]  Eilene Ghazi  Stillinger 12/03/2017,12:27 PM

## 2017-12-03 NOTE — Progress Notes (Signed)
Pt returned from HD CBG 23. Pt alert not oriented at this time. Able to swallow given OJ and Ensure and crackers. CBG remains 30.

## 2017-12-03 NOTE — Psychosocial Assessment (Signed)
Patient with 3rd loose stools after she is back from HD. Paged MD on call Hongalgi and informed him. No PRN to give. Spoke with daughter, she mentioned that patient had diarrhea before.  Awaiting on new orders.  Will continue to monitor.  Steaven Wholey, RN

## 2017-12-04 LAB — CBC
HCT: 25.3 % — ABNORMAL LOW (ref 36.0–46.0)
HEMOGLOBIN: 8.1 g/dL — AB (ref 12.0–15.0)
MCH: 27.6 pg (ref 26.0–34.0)
MCHC: 32 g/dL (ref 30.0–36.0)
MCV: 86.3 fL (ref 78.0–100.0)
PLATELETS: 200 10*3/uL (ref 150–400)
RBC: 2.93 MIL/uL — ABNORMAL LOW (ref 3.87–5.11)
RDW: 19.8 % — AB (ref 11.5–15.5)
WBC: 11.6 10*3/uL — AB (ref 4.0–10.5)

## 2017-12-04 LAB — GLUCOSE, CAPILLARY
GLUCOSE-CAPILLARY: 30 mg/dL — AB (ref 70–99)
GLUCOSE-CAPILLARY: 145 mg/dL — AB (ref 70–99)
GLUCOSE-CAPILLARY: 138 mg/dL — AB (ref 70–99)
Glucose-Capillary: 125 mg/dL — ABNORMAL HIGH (ref 70–99)
Glucose-Capillary: 150 mg/dL — ABNORMAL HIGH (ref 70–99)

## 2017-12-04 LAB — HEPARIN LEVEL (UNFRACTIONATED): Heparin Unfractionated: 0.61 IU/mL (ref 0.30–0.70)

## 2017-12-04 LAB — PROTIME-INR
INR: 1.28
PROTHROMBIN TIME: 15.9 s — AB (ref 11.4–15.2)

## 2017-12-04 MED ORDER — CHLORHEXIDINE GLUCONATE CLOTH 2 % EX PADS: 6.0000 | MEDICATED_PAD | Freq: Every day | CUTANEOUS | Status: DC

## 2017-12-04 MED ORDER — WARFARIN SODIUM 2.5 MG PO TABS
2.5000 mg | ORAL_TABLET | Freq: Once | ORAL | Status: AC
  Administered 2017-12-04: 2.5 mg via ORAL
  Filled 2017-12-04: qty 1

## 2017-12-04 MED ORDER — VITAMIN B-1 100 MG PO TABS
500.0000 mg | ORAL_TABLET | Freq: Every day | ORAL | Status: DC
  Administered 2017-12-04 – 2017-12-05 (×2): 500 mg via ORAL
  Filled 2017-12-04 (×2): qty 5

## 2017-12-04 NOTE — Consult Note (Signed)
            [facility] CM Primary Care Navigator  12/04/2017  [name] October 30, 1928 [id number]   Attempt to see patient at the bedside to identify possible discharge needs but noted per MD note today, that patient's daughter had expressed desire for palliative care consult and possible [name] (residential hospice) after hemodialysis tomorrow. Daughter was hoping that patient will improve enough to take her home and be able to continue her outpatient dialysis, however, if patient is unable to participate in outpatient dialysis, daughter is considering hospice. PT had apparently seen and evaluated patient but she was not able to participate, and patient had been noted to have more decline in function in the past few days.  Order for consult to palliative care in place.   For additional questions please contact:  Edwena Felty A. Kalea Perine, BSN, RN-BC [facility] PRIMARY CARE Navigator Cell: [phone]

## 2017-12-04 NOTE — Progress Notes (Signed)
ANTICOAGULATION CONSULT NOTE - Follow-Up  Pharmacy Consult:  Heparin > Coumadin Indication: DVT  Allergies  Allergen Reactions  . Codeine Shortness Of Breath, Swelling and Other (See Comments)    Swelling of the tongue, but patient tolerates Vicodin  . Coreg [Carvedilol] Other (See Comments)    Headaches   . Lopressor [Metoprolol Tartrate] Other (See Comments)    GI upset  . Metoprolol Other (See Comments)    GI upset  . Oxycodone Other (See Comments)    Hallucinations (also with Oxycontin)  . Tape Other (See Comments)    TAPE, especially PLASTIC, PULLS OFF THE SKIN!!    Patient Measurements: Height: 5\' 1"  (154.9 cm) Weight: 145 lb 8.1 oz (66 kg) IBW/kg (Calculated) : 47.8  Vital Signs: Temp: 98.3 F (36.8 C) (09/19 0700) Temp Source: Oral (09/19 0700) BP: 121/54 (09/19 0700) Pulse Rate: 79 (09/19 0700)  Labs: Recent Labs    12/01/17 1955  12/02/17 0648  12/03/17 0600 12/03/17 0731 12/03/17 0830 12/04/17 0631  HGB  --    < > 8.7*  --  7.8*  --   --  8.1*  HCT  --    < > 27.7*  --  24.4*  --   --  25.3*  PLT  --    < > 174  --  210  --   --  200  LABPROT  --   --   --   --   --   --   --  15.9*  INR  --   --   --   --   --   --   --  1.28  HEPARINUNFRC  --   --   --    < > 0.67 0.67  --  0.61  CREATININE 4.06*  --  4.40*  --   --   --  5.41*  --    < > = values in this interval not displayed.    Estimated Creatinine Clearance: 6.4 mL/min (A) (by C-G formula based on SCr of 5.41 mg/dL (H)).  Assessment: 84 yo F with history of Afib taken off of Coumadin (~2014) due to bleeding from fistula and fall risk.  Now with acute DVT and patient to restart Coumadin.  She is also on IV heparin bridge.  Today is day#2 of 5 minimum overlap for VTE.  Heparin in therapeutic on 850 units/hr.  From chart review, previous Coumadin dose appears to be 2.5/5mg  every other day alternating.  INR today is up 1.28 (from presumed baseline ~1).  Given decreased PO intake and diarrhea,  will reduce Coumadin dose this evening.  FOB positive but no overt bleeding; transfuse if hemoglobin drops further per MD.  Albumin low.  Goal of Therapy:  Heparin level goal 0.3-0.7 INR 2 - 3  Monitor CBC and platelets per protocol.   Plan:  Continue heparin at 850 units/hr. Coumadin 2.5 mg PO today Daily heparin level, CBC, and PT / INR   [employer], Pharm.D., BCPS Clinical Pharmacist Pager: [phone] Clinical phone for 12/04/2017 from 8:30-4:00 is [phone].  **Pharmacist phone directory can now be found on [email or web address] ([email or web address]).  Listed under [place].  12/04/2017 10:21 AM

## 2017-12-04 NOTE — Evaluation (Signed)
Physical Therapy Evaluation and Discharge Patient Details Name: [name] MRN: [id number] DOB: 09-02-1928 Today's Date: 12/04/2017   History of Present Illness  Pt is an 84 y/o female admitted secondary to AMS and multiple falls. Pt with acute encephalopathy of unclear etiology. Doplers revealed BLE (RLE chronic, LLE acute) DVTs. Imaging negative for PE and MRI negative for acute abnormality. PMH includes ESRD on HD, a fib, HTN, DM, and breast cancer s/p mastectomy.   Clinical Impression  Pt admitted secondary to problem above with deficits below. Pt with AMS, weakness, and BLE pain (RLE>LLE). Pt requiring max A for bed mobility tasks and to stand at EOB. Per pt's daughter, pt was able to walk PTA, and since pt has declined, plan to get palliative services involved. Pt's daughter requesting PT to sign off at this time given pt's decline in function. Educated pt's daughter, that if pt starts to improve, PT can be re-consulted. Will sign off at this time. If needs change, please re-consult.     Follow Up Recommendations Other (comment);Supervision/Assistance - 24 hour;No PT follow up(Hospice services )    Equipment Recommendations  Other (comment)(hoyer lift if pt to go home )    Recommendations for Other Services       Precautions / Restrictions Precautions Precautions: Fall Restrictions Weight Bearing Restrictions: No      Mobility  Bed Mobility Overal bed mobility: Needs Assistance Bed Mobility: Supine to Sit;Sit to Supine     Supine to sit: Max assist Sit to supine: Max assist   General bed mobility comments: Max A and multimodal cues to come to sitting at EOB. Once sitting at EOB, pt very fidgity and leaning towards R, requesting to lie back down. Was agreeable to stand X1, however, after standing requiring max A to return to supine.   Transfers Overall transfer level: Needs assistance Equipment used: 1 person hand held assist Transfers: Sit to/from Stand Sit to  Stand: Max assist         General transfer comment: Max A for lift assist and steadying. Pt with increased pain and unable to tolerate prolonged standing. PT stood in front of pt and had PT hold onto PT arms. After standing, pt requesting to return to supine.   Ambulation/Gait                Stairs            Wheelchair Mobility    Modified Rankin (Stroke Patients Only)       Balance Overall balance assessment: Needs assistance Sitting-balance support: No upper extremity supported;Feet supported Sitting balance-Leahy Scale: Poor Sitting balance - Comments: Reliant on min to min guard secondary to R lateral lean  Postural control: Right lateral lean Standing balance support: Bilateral upper extremity supported Standing balance-Leahy Scale: Poor Standing balance comment: Reliant on UE and external support.                              Pertinent Vitals/Pain Pain Assessment: Faces Faces Pain Scale: Hurts even more Pain Location: BLE (R>L) Pain Descriptors / Indicators: Aching;Grimacing;Guarding Pain Intervention(s): Limited activity within patient's tolerance;Monitored during session;Repositioned    Home Living Family/patient expects to be discharged to:: Private residence Living Arrangements: Children(daughter ) Available Help at Discharge: Family;Personal care attendant;Available 24 hours/day Type of Home: House Home Access: Ramped entrance     Home Layout: One level Home Equipment: Walker - 4 wheels;Bedside commode;Shower seat;Wheelchair - manual  Prior Function Level of Independence: Needs assistance   Gait / Transfers Assistance Needed: Daughter reports pt used rollator for ambulation within the house and used [facility] for mobility outside of the home.   ADL's / Homemaking Assistance Needed: Required assist with IADLs, however, reports she was able to dress.         Hand Dominance        Extremity/Trunk Assessment   Upper Extremity  Assessment Upper Extremity Assessment: Generalized weakness    Lower Extremity Assessment Lower Extremity Assessment: Generalized weakness    Cervical / Trunk Assessment Cervical / Trunk Assessment: Kyphotic  Communication   Communication: HOH  Cognition Arousal/Alertness: Awake/alert Behavior During Therapy: Restless Overall Cognitive Status: Impaired/Different from baseline Area of Impairment: Orientation;Memory;Following commands;Awareness;Problem solving                 Orientation Level: Disoriented to;Place;Time;Situation   Memory: Decreased short-term memory Following Commands: Follows one step commands inconsistently;Follows one step commands with increased time   Awareness: Intellectual Problem Solving: Slow processing;Decreased initiation;Difficulty sequencing;Requires verbal cues;Requires tactile cues General Comments: Recited her name (maiden name) and birthday. Required increased time and multimodal cues for sequencing. Pt very fidgity throughout.       General Comments General comments (skin integrity, edema, etc.): Discussed POC with pt's daughter and pt's daughter reporting she would not want to go to SNF. Per daughter, plan to get palliative care involved for hospice services. Daughter requesting for PT to sign off at this time.     Exercises     Assessment/Plan    PT Assessment Patent does not need any further PT services  PT Problem List         PT Treatment Interventions      PT Goals (Current goals can be found in the Care Plan section)  Acute Rehab PT Goals Patient Stated Goal: to have palliative services on board and to discontinue PT per pt's daughter  PT Goal Formulation: With family Time For Goal Achievement: 12/04/17 Potential to Achieve Goals: Fair    Frequency     Barriers to discharge        Co-evaluation               AM-PAC PT "6 Clicks" Daily Activity  Outcome Measure Difficulty turning over in bed (including  adjusting bedclothes, sheets and blankets)?: Unable Difficulty moving from lying on back to sitting on the side of the bed? : Unable Difficulty sitting down on and standing up from a chair with arms (e.g., wheelchair, bedside commode, etc,.)?: Unable Help needed moving to and from a bed to chair (including a wheelchair)?: Total Help needed walking in hospital room?: Total Help needed climbing 3-5 steps with a railing? : Total 6 Click Score: 6    End of Session Equipment Utilized During Treatment: Gait belt Activity Tolerance: Patient limited by pain Patient left: in bed;with call bell/phone within reach;with bed alarm set;with family/visitor present Nurse Communication: Mobility status PT Visit Diagnosis: Pain;Difficulty in walking, not elsewhere classified (R26.2);Muscle weakness (generalized) (M62.81);Unsteadiness on feet (R26.81) Pain - Right/Left: (bilat ) Pain - part of body: Leg    Time: 3532-9924 PT Time Calculation (min) (ACUTE ONLY): 31 min   Charges:   PT Evaluation $PT Eval Moderate Complexity: 1 Mod PT Treatments $Therapeutic Activity: 8-22 mins        Leighton Ruff, PT, DPT  Acute Rehabilitation Services  Pager: [phone] Office: [phone]   Rudean Hitt 12/04/2017, 6:15 PM

## 2017-12-04 NOTE — Progress Notes (Signed)
Addendum  I received message from patient's daughter that she wants a palliative care consult & possible [facility] after HD tomorrow.  I called and discussed with her.  PT apparently evaluated patient and patient was unable to stand up or participate.  As per extended family, patient has declined more in the the last couple of days.  Daughter is clear and wishes to get palliative care on board.  Consultation placed.  Vernell Leep, MD, FACP, [facility]. [facility] Pager [phone]  If 7PM-7AM, please contact night-coverage [email or web address] Password [facility] 12/04/2017, 4:38 PM

## 2017-12-04 NOTE — Progress Notes (Signed)
PROGRESS NOTE   [name]  KVQ:[id number]    DOB: 08-12-1928    DOA: 12/01/2017  PCP: Binnie Rail, MD   I have briefly reviewed patients previous medical records in [facility].  Brief Narrative:  84 year old female with PMH of ESRD on MWF HD, HTN, DM 2, HLD, chronic systolic CHF, hypothyroid, A. fib on anticoagulation, progressively declining for the last couple of months, was brought to the ED 12/02/2017 after she became increasingly confused during HD, 2 days history of increasing difficulty moving right lower extremity, having frequent falls, on and off diarrhea for 3 to 4 days and poor appetite.  In ED, mildly hypotensive with elevated lactate, improved after IV saline bolus, noted new right lower extremity swelling.  CTA chest was negative for PE, CT head unremarkable.  Admitted for acute encephalopathy, right lower extremity swelling which is now diagnosed with DVT.  After extensive discussion with daughter, initiated IV heparin drip and Coumadin bridging.  Nephrology following for dialysis needs.   Assessment & Plan:   Principal Problem:   Acute encephalopathy Active Problems:   HTN (hypertension)   DM2 (diabetes mellitus, type 2) (HCC)   Anemia   Breast cancer, s/p Lt mastectomy   Chronic combined systolic and diastolic heart failure EF 45-50% by echo 06/25/2012   ESRD on dialysis ([place])   PAF (paroxysmal atrial fibrillation) (HCC)   Memory loss   Hypothyroidism   Acute right lower extremity DVT/Chronic LLE DVT: Initially felt to have cellulitis and had been empirically started on antibiotics.  Lower extremity venous Dopplers 9/17 showed acute DVT involving the right posterior tibial vein and right peroneal vein and chronic DVT in the left peroneal vein.  She is deemed to be high risk patient for bleeding complications given advanced age, overall physical and mental decline over the last couple of months, fall risk, FOBT +, has been taken off Coumadin in the past for  bleeding.  This was discussed in detail with patient's daughter by my partner Dr. Eliseo Squires on 9/17 and by me on 9/18 regarding options of aggressive management with anticoagulation with IV heparin bridge and Coumadin versus IVC filter.  Patient's daughter who is a case Freight forwarder at [employer] long decided in favor of IV heparin drip and Coumadin.  She hopes that patient will improve enough to take her home and be able to continue her outpatient dialysis.  However if patient unable to participate in outpatient dialysis, daughter is considering hospice.  She declines palliative care consultation at this time and indicates that she is well aware of what the palliative care team is going to say and she is well educated to make decisions by herself.  Started IV heparin 9/17.  Started Coumadin per pharmacy 9/18.  Clinically patient's right lower extremity findings are not consistent with cellulitis.  Discontinued empirically started vancomycin and cefepime.  INR 1.28.  One set of blood cultures showed gram-positive rods.  I discussed with pharmacy in detail.  They recommend that this is likely diphtheroids/contaminant and no indication to use resume antibiotics.  Acute encephalopathy: Unclear etiology.  CT head and MRI brain without acute abnormalities.  No obvious infectious etiology identified.  TSH normal 8/15.  Alert and oriented only to self.  Do not see significant change in mental status compared to yesterday.  Check thiamine level and start empiric thiamine supplementation.  Delirium precautions.  Minimize sedative medications.  Reorient.  Monitor closely.  Diarrhea. chronic: Unclear etiology.  Ultrasound abdomen without acute findings.  Abdomen benign on exam. PRN Imodium.  Low index of suspicion for C. difficile.  ESRD on MWF HD: Nephrology following.  I discussed with Dr. Melvia Heaps.  Last HD 9/18.  Next HD 9/20.  Type II DM with hypoglycemia: Most likely complicated by poor oral intake.  Discontinued all insulins  but continue to monitor CBGs closely and treat hypoglycemia per protocol.  If CBGs consistently elevated >180 then consider customized SSI.  A1c 5.1 on 8/15.  Since poor oral intake, diet was changed to regular diet on 9/18.  Monitor.  Hypothyroid: Continue Synthroid.  Essential hypertension: Currently controlled off of medicines.  Bystolic had been held due to hypotension.  Monitor for now.  Chronic systolic CHF: Volume management across HD.  Does not appear overtly volume overloaded.  History of breast cancer status post mastectomy.  Anemia of ESRD: Hemoglobin has gradually dropped from 9.1 > 8.7 > 7.8.  FOBT + but no overt bleeding.  Monitor closely while on anticoagulation.  Consider blood transfusion if hemoglobin drops further.  Hemoglobin 8.1 today.  History of A. fib: Currently in sinus rhythm.  Bystolic on hold due to initial hypotension, consider restarting if remains normotensive or if blood pressures start to increase in the next 24 hours.  Was not on anticoagulation PTA, now on anticoagulation for DVT.  Hyperlipidemia: Statins.  Adult failure to thrive: Multifactorial due to very advanced age, multiple severe significant comorbidities and suspect some element of dementia.  Discussion as above.  Pulmonary nodules: Seen on CTA chest and reportedly stable.  Given her overall failure to thrive, probably no need for follow-up.   DVT prophylaxis: IV heparin bridge + Coumadin per pharmacy. Code Status: DNR Family Communication: Discussed in detail with patient's daughter who is a case Freight forwarder at [employer].  Updated care and answered questions. Today she is still confused and fidgety. Disposition: To be determined.  Daughter hopes that patient will improve enough for her to go home and continue outpatient HD.  PT requested.   Consultants:  Nephrology.  Procedures:  HD.  Antimicrobials:  Cefepime-discontinued.   Subjective: Patient sleeping but easily arousable.   Oriented only to self.  "I am outside the [name]".  Follows simple instructions but unable to provide much history.  As per RN, confused overnight and patient's bed had to be moved closer to nurses station.  ROS: Unable due to mental status changes.  Objective:  Vitals:   12/04/17 0059 12/04/17 0647 12/04/17 0700 12/04/17 1202  BP:  (!) 139/44 (!) 121/54 (!) 135/39  Pulse:  78 79 78  Resp:  20 20 18   Temp:  98.3 F (36.8 C) 98.3 F (36.8 C) 98.7 F (37.1 C)  TempSrc:  Oral Oral Oral  SpO2:  100% 100% 100%  Weight: 66 kg     Height:        Examination:  General exam: Pleasant elderly female, small built, frail and chronically ill looking, lying comfortably in bed.  Does not appear to be in any distress.  Not fidgety. Respiratory system: Poor inspiratory effort.  Slightly diminished breath sounds in the bases but otherwise clear to auscultation. Respiratory effort normal.  No change/stable. Cardiovascular system: S1 & S2 heard, RRR. No JVD, murmurs, rubs, gallops or clicks. No pedal edema.  Telemetry personally reviewed: Sinus rhythm.  Occasional nonsustained SVT. Gastrointestinal system: Abdomen is nondistended, soft and nontender. No organomegaly or masses felt. Normal bowel sounds heard.  Stable. Central nervous system: Mental status as indicated above. No focal  neurological deficits. Extremities: Symmetric 5 x 5 power.  Right upper arm AV fistula.  Right lower extremity: Hyperpigmentation from knee down.  Right knee surgical scar healed by secondary intention.  Right thigh scar of skin graft.  No other acute findings such as erythema, open wounds or tenderness.  Does have small blisters on dorsum of some toes. Skin: No rashes, lesions or ulcers Psychiatry: Judgement and insight impaired. Mood & affect cannot assess due to mental status change..     Data Reviewed: I have personally reviewed following labs and imaging studies  CBC: Recent Labs  Lab 12/01/17 2057  12/02/17 0648 12/03/17 0600 12/04/17 0631  WBC 9.4 9.0 9.1 11.6*  NEUTROABS 8.0*  --   --   --   HGB 9.1* 8.7* 7.8* 8.1*  HCT 28.9* 27.7* 24.4* 25.3*  MCV 87.6 88.5 86.2 86.3  PLT 171 174 210 379   Basic Metabolic Panel: Recent Labs  Lab 12/01/17 1955 12/02/17 0648 12/03/17 0830  NA 139  --  139  K 3.7  --  3.8  CL 99  --  100  CO2 24  --  26  GLUCOSE 148*  --  65*  BUN 13  --  20  CREATININE 4.06* 4.40* 5.41*  CALCIUM 8.0*  --  7.9*  PHOS  --   --  3.5   Liver Function Tests: Recent Labs  Lab 12/01/17 1955 12/03/17 0830  AST 35  --   ALT 16  --   ALKPHOS 215*  --   BILITOT 0.9  --   PROT 5.8*  --   ALBUMIN 2.3* 1.7*   CBG: Recent Labs  Lab 12/03/17 1617 12/03/17 1848 12/03/17 2257 12/04/17 0823 12/04/17 1153  GLUCAP 178* 260* 215* 125* 145*    Recent Results (from the past 240 hour(s))  Culture, blood (routine x 2)     Status: None (Preliminary result)   Collection Time: 12/02/17 12:32 AM  Result Value Ref Range Status   Specimen Description BLOOD LEFT ANTECUBITAL  Final   Special Requests   Final    BOTTLES DRAWN AEROBIC AND ANAEROBIC Blood Culture results may not be optimal due to an inadequate volume of blood received in culture bottles   Culture  Setup Time   Final    GRAM POSITIVE RODS IN BOTH AEROBIC AND ANAEROBIC BOTTLES CRITICAL RESULT CALLED TO, READ BACK BY AND VERIFIED WITHShellee Milo PHARMD 0240 12/03/17 A BROWNING    Culture   Final    CULTURE REINCUBATED FOR BETTER GROWTH Performed at [facility], [place] [address], [place], [place] [address]    Report Status PENDING  Incomplete  MRSA PCR Screening     Status: None   Collection Time: 12/02/17  4:38 AM  Result Value Ref Range Status   MRSA by PCR NEGATIVE NEGATIVE Final    Comment:        The GeneXpert MRSA Assay (FDA approved for NASAL specimens only), is one component of a comprehensive MRSA colonization surveillance program. It is not intended to diagnose MRSA infection  nor to guide or monitor treatment for MRSA infections. Performed at [facility], [place] [address], [place], [place] [address]   Culture, blood (routine x 2)     Status: None (Preliminary result)   Collection Time: 12/02/17  6:40 AM  Result Value Ref Range Status   Specimen Description BLOOD LEFT HAND  Final   Special Requests   Final    BOTTLES DRAWN AEROBIC  ONLY Blood Culture adequate volume   Culture   Final    NO GROWTH 2 DAYS Performed at [facility], [place] [address] [place], [place] [address]    Report Status PENDING  Incomplete         Radiology Studies: No results found.      Scheduled Meds: . Chlorhexidine Gluconate Cloth  6 each Topical Q0600  . darbepoetin (ARANESP) injection - DIALYSIS  200 mcg Intravenous Q Wed-HD  . famotidine  20 mg Oral Daily  . feeding supplement (PRO-STAT SUGAR FREE 64)  30 mL Oral BID  . levothyroxine  88 mcg Oral QAC breakfast  . pantoprazole  40 mg Oral Daily  . sevelamer carbonate  800 mg Oral TID WC  . warfarin  2.5 mg Oral ONCE-1800  . Warfarin - Pharmacist Dosing Inpatient   Does not apply q1800   Continuous Infusions: . heparin 850 Units/hr (12/04/17 0119)     LOS: 2 days     Vernell Leep, MD, FACP, [facility]. [facility] Pager [phone]  If 7PM-7AM, please contact night-coverage [email or web address] Password [facility] 12/04/2017, 3:18 PM

## 2017-12-04 NOTE — Progress Notes (Signed)
[facility] Progress Note   Subjective:  Seen in the room. No c/o, drowsy  Objective Vitals:   12/03/17 2023 12/04/17 0059 12/04/17 0647 12/04/17 0700  BP: (!) 125/40  (!) 139/44 (!) 121/54  Pulse: 84  78 79  Resp: 18  20 20   Temp: 99.3 F (37.4 C)  98.3 F (36.8 C) 98.3 F (36.8 C)  TempSrc: Oral  Oral Oral  SpO2: 100%  100% 100%  Weight:  66 kg    Height:       Physical Exam General: Frail female, confused, room air Heart: RRR; 2/6 systolic murmur Lungs: CTA anteriorly Abdomen: soft, non-tender Extremities: 1+ RLE edema with scattered hyperpigmented areas, no LLE edema Dialysis Access: R AVF + bruit  Additional Objective Labs: Basic Metabolic Panel: Recent Labs  Lab 12/01/17 1955 12/02/17 0648 12/03/17 0830  NA 139  --  139  K 3.7  --  3.8  CL 99  --  100  CO2 24  --  26  GLUCOSE 148*  --  65*  BUN 13  --  20  CREATININE 4.06* 4.40* 5.41*  CALCIUM 8.0*  --  7.9*  PHOS  --   --  3.5   Liver Function Tests: Recent Labs  Lab 12/01/17 1955 12/03/17 0830  AST 35  --   ALT 16  --   ALKPHOS 215*  --   BILITOT 0.9  --   PROT 5.8*  --   ALBUMIN 2.3* 1.7*   CBC: Recent Labs  Lab 12/01/17 2057 12/02/17 0648 12/03/17 0600 12/04/17 0631  WBC 9.4 9.0 9.1 11.6*  NEUTROABS 8.0*  --   --   --   HGB 9.1* 8.7* 7.8* 8.1*  HCT 28.9* 27.7* 24.4* 25.3*  MCV 87.6 88.5 86.2 86.3  PLT 171 174 210 200   Blood Culture    Component Value Date/Time   SDES BLOOD LEFT HAND 12/02/2017 0640   SPECREQUEST  12/02/2017 0640    BOTTLES DRAWN AEROBIC ONLY Blood Culture adequate volume   CULT  12/02/2017 0640    NO GROWTH 2 DAYS Performed at [facility], [place] [address] [place], [place] [address]    REPTSTATUS PENDING 12/02/2017 1194   Studies/Results: No results found. Medications: . heparin 850 Units/hr (12/04/17 0119)   . Chlorhexidine Gluconate Cloth  6 each Topical Q0600  . darbepoetin (ARANESP) injection - DIALYSIS  200 mcg Intravenous  Q Wed-HD  . famotidine  20 mg Oral Daily  . feeding supplement (PRO-STAT SUGAR FREE 64)  30 mL Oral BID  . levothyroxine  88 mcg Oral QAC breakfast  . pantoprazole  40 mg Oral Daily  . sevelamer carbonate  800 mg Oral TID WC  . warfarin  2.5 mg Oral ONCE-1800  . Warfarin - Pharmacist Dosing Inpatient   Does not apply q1800    Dialysis Orders: [facility] 3.5h  350/600  54.5kg   4K/2.25 bath  AVF  Hep none - S/p recent f'gram 9/12. - Mircera 147mcg IV q 2 wks (last 9/4), Venofer 50mg  IV weekly - Calcitriol 0.52mcg PO q HD  Assessment/Plan: 1.  AMS: not really better. Negative head CT and MRI. Per primary the family's plan is to anticoag for #2 if pt's MS improves, if not will likely pursue hospice care.  2.  Acute RLE DVT: Fall risk, + FOBT. As above.  3.  Diarrhea: better 4.  ESRD: MWF HD.  HD tomorrow.  5.  HTN/volume: BP controlled. No pulm edema.  Below outpt EDW - low goal. 6.  Anemia: Hgb 7.8 - has dropped overnight; will re-dose Aranesp today (227mcg) 7.  Metabolic bone disease: Ca/Phos ok, will follow labs. 8.  Nutrition: Alb low, adding pro-stat supps. 9.  Type 2 DM 10.   Hx A-fib  Kelly Splinter MD [employer] pgr [phone]   12/04/2017, 10:31 AM

## 2017-12-05 ENCOUNTER — Encounter (HOSPITAL_COMMUNITY): Payer: Self-pay | Admitting: Primary Care

## 2017-12-05 DIAGNOSIS — Z7189 Other specified counseling: Secondary | ICD-10-CM

## 2017-12-05 DIAGNOSIS — Z515 Encounter for palliative care: Secondary | ICD-10-CM

## 2017-12-05 LAB — RENAL FUNCTION PANEL
Albumin: 1.9 g/dL — ABNORMAL LOW (ref 3.5–5.0)
Anion gap: 12 (ref 5–15)
BUN: 18 mg/dL (ref 8–23)
CHLORIDE: 100 mmol/L (ref 98–111)
CO2: 24 mmol/L (ref 22–32)
CREATININE: 4.4 mg/dL — AB (ref 0.44–1.00)
Calcium: 8.1 mg/dL — ABNORMAL LOW (ref 8.9–10.3)
GFR calc non Af Amer: 8 mL/min — ABNORMAL LOW (ref >60)
GFR, EST AFRICAN AMERICAN: 10 mL/min — AB (ref >60)
Glucose, Bld: 135 mg/dL — ABNORMAL HIGH (ref 70–99)
Phosphorus: 2.7 mg/dL (ref 2.5–4.6)
Potassium: 3.5 mmol/L (ref 3.5–5.1)
Sodium: 136 mmol/L (ref 135–145)

## 2017-12-05 LAB — PROTIME-INR
INR: >10
INR: 2.52
Prothrombin Time: 27 seconds — ABNORMAL HIGH (ref 11.4–15.2)

## 2017-12-05 LAB — CBC
HCT: 27.6 % — ABNORMAL LOW (ref 36.0–46.0)
HEMOGLOBIN: 8.9 g/dL — AB (ref 12.0–15.0)
MCH: 28.1 pg (ref 26.0–34.0)
MCHC: 32.2 g/dL (ref 30.0–36.0)
MCV: 87.1 fL (ref 78.0–100.0)
PLATELETS: 304 10*3/uL (ref 150–400)
RBC: 3.17 MIL/uL — AB (ref 3.87–5.11)
RDW: 20.3 % — ABNORMAL HIGH (ref 11.5–15.5)
WBC: 12.8 10*3/uL — ABNORMAL HIGH (ref 4.0–10.5)

## 2017-12-05 LAB — CULTURE, BLOOD (ROUTINE X 2)

## 2017-12-05 LAB — GLUCOSE, CAPILLARY
GLUCOSE-CAPILLARY: 97 mg/dL (ref 70–99)
GLUCOSE-CAPILLARY: 84 mg/dL (ref 70–99)
Glucose-Capillary: 74 mg/dL (ref 70–99)

## 2017-12-05 LAB — HEPARIN LEVEL (UNFRACTIONATED)
Heparin Unfractionated: >2.2 IU/mL — ABNORMAL HIGH (ref 0.30–0.70)
Heparin Unfractionated: 0.49 IU/mL (ref 0.30–0.70)

## 2017-12-05 MED ORDER — PENTAFLUOROPROP-TETRAFLUOROETH EX AERO: 1.0000 "application " | INHALATION_SPRAY | CUTANEOUS | Status: DC | PRN

## 2017-12-05 MED ORDER — SODIUM CHLORIDE 0.9 % IV SOLN: 100.0000 mL | INTRAVENOUS | Status: DC | PRN

## 2017-12-05 MED ORDER — HEPARIN SODIUM (PORCINE) 1000 UNIT/ML DIALYSIS
1000.0000 [IU] | INTRAMUSCULAR | Status: DC | PRN
  Filled 2017-12-05: qty 1

## 2017-12-05 MED ORDER — ALTEPLASE 2 MG IJ SOLR: 2.0000 mg | Freq: Once | INTRAMUSCULAR | Status: DC | PRN

## 2017-12-05 MED ORDER — LIDOCAINE HCL (PF) 1 % IJ SOLN: 5.0000 mL | INTRAMUSCULAR | Status: DC | PRN

## 2017-12-05 MED ORDER — LIDOCAINE-PRILOCAINE 2.5-2.5 % EX CREA
1.0000 "application " | TOPICAL_CREAM | CUTANEOUS | Status: DC | PRN
  Filled 2017-12-05: qty 5

## 2017-12-05 NOTE — Progress Notes (Signed)
PROGRESS NOTE   [name]  BLT:[id number]    DOB: 1928/04/27    DOA: 12/01/2017  PCP: Binnie Rail, MD   I have briefly reviewed patients previous medical records in [facility].  Brief Narrative:  84 year old female with PMH of ESRD on MWF HD, HTN, DM 2, HLD, chronic systolic CHF, hypothyroid, A. fib on anticoagulation, progressively declining for the last couple of months, was brought to the ED 12/02/2017 after she became increasingly confused during HD, 2 days history of increasing difficulty moving right lower extremity, having frequent falls, on and off diarrhea for 3 to 4 days and poor appetite.  In ED, mildly hypotensive with elevated lactate, improved after IV saline bolus, noted new right lower extremity swelling.  CTA chest was negative for PE, CT head unremarkable.  Admitted for acute encephalopathy, right lower extremity swelling which is now diagnosed with DVT.  After extensive discussion with daughter, initiated IV heparin drip and Coumadin bridging.  Nephrology following for dialysis needs.   Assessment & Plan:   Principal Problem:   Acute encephalopathy Active Problems:   HTN (hypertension)   DM2 (diabetes mellitus, type 2) (HCC)   Anemia   Breast cancer, s/p Lt mastectomy   Chronic combined systolic and diastolic heart failure EF 45-50% by echo 06/25/2012   ESRD on dialysis ([place])   PAF (paroxysmal atrial fibrillation) (HCC)   Memory loss   Hypothyroidism   Goals of care, counseling/discussion   Palliative care by specialist   Encounter for hospice care discussion   Acute right lower extremity DVT/Chronic LLE DVT: Initially felt to have cellulitis and had been empirically started on antibiotics.  Lower extremity venous Dopplers 9/17 showed acute DVT involving the right posterior tibial vein and right peroneal vein and chronic DVT in the left peroneal vein.  She is deemed to be high risk patient for bleeding complications given advanced age, overall physical  and mental decline over the last couple of months, fall risk, FOBT +, has been taken off Coumadin in the past for bleeding.  This was discussed in detail with patient's daughter by my partner Dr. Eliseo Squires on 9/17 and by me on 9/18 regarding options of aggressive management with anticoagulation with IV heparin bridge and Coumadin versus IVC filter.  Patient's daughter who is a case Freight forwarder at [employer] long decided in favor of IV heparin drip and Coumadin.  She hopes that patient will improve enough to take her home and be able to continue her outpatient dialysis.  However if patient unable to participate in outpatient dialysis, daughter is considering hospice.  She declines palliative care consultation at this time and indicates that she is well aware of what the palliative care team is going to say and she is well educated to make decisions by herself.  Started IV heparin 9/17.  Started Coumadin per pharmacy 9/18.  Clinically patient's right lower extremity findings are not consistent with cellulitis.  Discontinued empirically started vancomycin and cefepime.  INR 1.28 on 9/19.  INR before dialysis on 9/20 > 10, suspect lab error.  Pharmacy repeating INR this evening.  No bleeding reported.  One set of blood cultures showed gram-positive rods.  I discussed with pharmacy in detail.  They recommend that this is likely diphtheroids/contaminant and no indication to use resume antibiotics.  Acute encephalopathy: Unclear etiology.  CT head and MRI brain without acute abnormalities.  No obvious infectious etiology identified.  TSH normal 8/15.  Checking thiamine level and started empiric thiamine supplementation.  Delirium precautions.  Minimize sedative medications.  Reorient.  Mental status seemed slightly better today, more alert, less fidgety, more communicative and responsive.  Diarrhea. chronic: Unclear etiology.  Ultrasound abdomen without acute findings.  Abdomen benign on exam. PRN Imodium.  Low index of suspicion  for C. difficile.  ESRD on MWF HD: Nephrology following.  As per daughter's report, unable to complete dialysis today due to hypotension.  Daughter having a hard time to determine further course of action and wants to wait until Mondays dialysis session to make further decisions.  Type II DM with hypoglycemia: Most likely complicated by poor oral intake.  Discontinued all insulins but continue to monitor CBGs closely and treat hypoglycemia per protocol.  If CBGs consistently elevated >180 then consider customized SSI.  A1c 5.1 on 8/15.  Since poor oral intake, diet was changed to regular diet on 9/18.  Monitor.  No further hypoglycemic episodes.  Hypothyroid: Continue Synthroid.  Essential hypertension: Currently controlled off of medicines.  Bystolic had been held due to hypotension.  Monitor for now.  Chronic systolic CHF: Volume management across HD.  Does not appear overtly volume overloaded.  History of breast cancer status post mastectomy.  Anemia of ESRD: Hemoglobin has gradually dropped from 9.1 > 8.7 > 7.8.  FOBT + but no overt bleeding.  Monitor closely while on anticoagulation.  Consider blood transfusion if hemoglobin drops further.  Hemoglobin stable in the 8 range over the last 2 days.  History of A. fib: Currently in sinus rhythm.  Bystolic on hold due to initial hypotension.  Was not on anticoagulation PTA, now on anticoagulation for DVT.  Continue to hold Bystolic due to hypotension reported on HD.  Hyperlipidemia: Statins.  Adult failure to thrive: Multifactorial due to very advanced age, multiple severe significant comorbidities and suspect some element of dementia.  Discussion as above.  Pulmonary nodules: Seen on CTA chest and reportedly stable.  Given her overall failure to thrive, probably no need for follow-up.   DVT prophylaxis: IV heparin bridge + Coumadin per pharmacy. Code Status: DNR Family Communication: Discussed in detail with patient's daughter who is a  case Freight forwarder at [employer].  Updated care and answered questions.  Disposition: To be determined.     Consultants:  Nephrology. Palliative care team  Procedures:  HD.  Antimicrobials:  Cefepime-discontinued.   Subjective: Patient seen this morning prior to HD.  Alert and oriented to self and place (hospital) but not time (1920).  Denies complaints.  Denies pain.  Knows that she is going for dialysis today.  As per RN, no confusion or attempting to get out of bed last night.  ROS: Unable due to mental status changes.  Objective:  Vitals:   12/05/17 1130 12/05/17 1200 12/05/17 1230 12/05/17 1245  BP: (!) 152/47 (!) 86/57 (!) 110/93 (!) 96/54  Pulse: 78 91 68 89  Resp:    19  Temp:    98 F (36.7 C)  TempSrc:    Oral  SpO2:    99%  Weight:    69 kg  Height:        Examination: No significant change in exam from yesterday except for mental status as described above.  General exam: Pleasant elderly female, small built, frail and chronically ill looking, lying comfortably in bed.  Does not appear to be in any distress.  Not fidgety. Respiratory system: Poor inspiratory effort.  Slightly diminished breath sounds in the bases but otherwise clear to auscultation. Respiratory effort normal.  No change/stable. Cardiovascular system: S1 & S2 heard, RRR. No JVD, murmurs, rubs, gallops or clicks. No pedal edema.  Telemetry personally reviewed: Sinus rhythm.?  A. fib at times. Gastrointestinal system: Abdomen is nondistended, soft and nontender. No organomegaly or masses felt. Normal bowel sounds heard.  Stable. Central nervous system: Mental status as indicated above. No focal neurological deficits. Extremities: Symmetric 5 x 5 power.  Right upper arm AV fistula.  Right lower extremity: Hyperpigmentation from knee down.  Right knee surgical scar healed by secondary intention.  Right thigh scar of skin graft.  No other acute findings such as erythema, open wounds or tenderness.   Does have small blisters on dorsum of some toes. Skin: No rashes, lesions or ulcers Psychiatry: Judgement and insight impaired. Mood & affect cannot assess due to mental status change..     Data Reviewed: I have personally reviewed following labs and imaging studies  CBC: Recent Labs  Lab 12/01/17 2057 12/02/17 0648 12/03/17 0600 12/04/17 0631 12/05/17 0711  WBC 9.4 9.0 9.1 11.6* 12.8*  NEUTROABS 8.0*  --   --   --   --   HGB 9.1* 8.7* 7.8* 8.1* 8.9*  HCT 28.9* 27.7* 24.4* 25.3* 27.6*  MCV 87.6 88.5 86.2 86.3 87.1  PLT 171 174 210 200 989   Basic Metabolic Panel: Recent Labs  Lab 12/01/17 1955 12/02/17 0648 12/03/17 0830 12/05/17 0900  NA 139  --  139 136  K 3.7  --  3.8 3.5  CL 99  --  100 100  CO2 24  --  26 24  GLUCOSE 148*  --  65* 135*  BUN 13  --  20 18  CREATININE 4.06* 4.40* 5.41* 4.40*  CALCIUM 8.0*  --  7.9* 8.1*  PHOS  --   --  3.5 2.7   Liver Function Tests: Recent Labs  Lab 12/01/17 1955 12/03/17 0830 12/05/17 0900  AST 35  --   --   ALT 16  --   --   ALKPHOS 215*  --   --   BILITOT 0.9  --   --   PROT 5.8*  --   --   ALBUMIN 2.3* 1.7* 1.9*   CBG: Recent Labs  Lab 12/04/17 1153 12/04/17 1701 12/04/17 2024 12/05/17 0737 12/05/17 1742  GLUCAP 145* 138* 150* 97 74    Recent Results (from the past 240 hour(s))  Culture, blood (routine x 2)     Status: Abnormal   Collection Time: 12/02/17 12:32 AM  Result Value Ref Range Status   Specimen Description BLOOD LEFT ANTECUBITAL  Final   Special Requests   Final    BOTTLES DRAWN AEROBIC AND ANAEROBIC Blood Culture results may not be optimal due to an inadequate volume of blood received in culture bottles   Culture  Setup Time   Final    GRAM POSITIVE RODS IN BOTH AEROBIC AND ANAEROBIC BOTTLES CRITICAL RESULT CALLED TO, READ BACK BY AND VERIFIED WITH: L SEAY PHARMD 2119 12/03/17 A BROWNING    Culture (A)  Final    DIPHTHEROIDS(CORYNEBACTERIUM SPECIES) Standardized susceptibility testing  for this organism is not available. Performed at [facility], [place] [address], [place], [place] [address]    Report Status 12/05/2017 FINAL  Final  MRSA PCR Screening     Status: None   Collection Time: 12/02/17  4:38 AM  Result Value Ref Range Status   MRSA by PCR NEGATIVE NEGATIVE Final    Comment:  The GeneXpert MRSA Assay (FDA approved for NASAL specimens only), is one component of a comprehensive MRSA colonization surveillance program. It is not intended to diagnose MRSA infection nor to guide or monitor treatment for MRSA infections. Performed at [facility], [place] [address], [place], [place] [address]   Culture, blood (routine x 2)     Status: None (Preliminary result)   Collection Time: 12/02/17  6:40 AM  Result Value Ref Range Status   Specimen Description BLOOD LEFT HAND  Final   Special Requests   Final    BOTTLES DRAWN AEROBIC ONLY Blood Culture adequate volume   Culture   Final    NO GROWTH 3 DAYS Performed at [facility], [place] [address], [place], [place] [address]    Report Status PENDING  Incomplete         Radiology Studies: No results found.      Scheduled Meds: . Chlorhexidine Gluconate Cloth  6 each Topical Q0600  . darbepoetin (ARANESP) injection - DIALYSIS  200 mcg Intravenous Q Wed-HD  . famotidine  20 mg Oral Daily  . feeding supplement (PRO-STAT SUGAR FREE 64)  30 mL Oral BID  . levothyroxine  88 mcg Oral QAC breakfast  . pantoprazole  40 mg Oral Daily  . sevelamer carbonate  800 mg Oral TID WC  . thiamine  500 mg Oral Daily  . Warfarin - Pharmacist Dosing Inpatient   Does not apply q1800   Continuous Infusions: . heparin 850 Units/hr (12/05/17 1800)     LOS: 3 days     Vernell Leep, MD, FACP, [facility]. [facility] Pager [phone]  If 7PM-7AM, please contact night-coverage [email or web address] Password [facility] 12/05/2017, 6:38 PM

## 2017-12-05 NOTE — Progress Notes (Addendum)
ANTICOAGULATION CONSULT NOTE - Follow-Up  Pharmacy Consult:  Heparin > Coumadin Indication: DVT  Allergies  Allergen Reactions  . Codeine Shortness Of Breath, Swelling and Other (See Comments)    Swelling of the tongue, but patient tolerates Vicodin  . Coreg [Carvedilol] Other (See Comments)    Headaches   . Lopressor [Metoprolol Tartrate] Other (See Comments)    GI upset  . Metoprolol Other (See Comments)    GI upset  . Oxycodone Other (See Comments)    Hallucinations (also with Oxycontin)  . Tape Other (See Comments)    TAPE, especially PLASTIC, PULLS OFF THE SKIN!!    Patient Measurements: Height: 5\' 1"  (154.9 cm) Weight: 152 lb 1.9 oz (69 kg) IBW/kg (Calculated) : 47.8  Vital Signs: Temp: 98 F (36.7 C) (09/20 1245) Temp Source: Oral (09/20 1245) BP: 96/54 (09/20 1245) Pulse Rate: 89 (09/20 1245)  Labs: Recent Labs    12/03/17 0600  12/03/17 0830 12/04/17 0631 12/05/17 0711 12/05/17 0900 12/05/17 1015 12/05/17 2020  HGB 7.8*  --   --  8.1* 8.9*  --   --   --   HCT 24.4*  --   --  25.3* 27.6*  --   --   --   PLT 210  --   --  200 304  --   --   --   LABPROT  --   --   --  15.9* >90.0*  --   --  27.0*  INR  --   --   --  1.28 >10.00*  --   --  2.52  HEPARINUNFRC 0.67   < >  --  0.61 >2.20*  --  0.49  --   CREATININE  --   --  5.41*  --   --  4.40*  --   --    < > = values in this interval not displayed.    Estimated Creatinine Clearance: 8 mL/min (A) (by C-G formula based on SCr of 4.4 mg/dL (H)).  Assessment: 84 yo F with history of Afib taken off of Coumadin (~2014) due to bleeding from fistula and fall risk.  Now with acute DVT and patient to restart Coumadin.  She is also on IV heparin bridge.  Today is day#3 of 5 minimum overlap for VTE. -INR reported as > 10 earlier today and repeat was 2.52 (up from 1.28 on 9/19) -AST/ALT= 35/16   Goal of Therapy:  Heparin level goal 0.3-0.7 INR 2 - 3  Monitor CBC and platelets per protocol.   Plan:  Hold  coumadin and follow INR trend in am No heparin changes Daily heparin level, CBC, and PT / INR  Hildred Laser, PharmD Clinical Pharmacist Please check Amion for pharmacy contact number

## 2017-12-05 NOTE — Progress Notes (Signed)
CRITICAL LAB VALUE:  Notified by lab that INR is 10.00. Dialysis notified immediately. Will notify Dr. Algis Liming

## 2017-12-05 NOTE — Plan of Care (Signed)
  Problem: Education: Goal: Knowledge of General Education information will improve Description Including pain rating scale, medication(s)/side effects and non-pharmacologic comfort measures Outcome: Not Progressing Note:  Pt. confused- reviewed POC with pt. And unsure how much pt. understands.

## 2017-12-05 NOTE — Progress Notes (Signed)
ANTICOAGULATION CONSULT NOTE - Follow-Up  Pharmacy Consult:  Heparin > Coumadin Indication: DVT  Allergies  Allergen Reactions  . Codeine Shortness Of Breath, Swelling and Other (See Comments)    Swelling of the tongue, but patient tolerates Vicodin  . Coreg [Carvedilol] Other (See Comments)    Headaches   . Lopressor [Metoprolol Tartrate] Other (See Comments)    GI upset  . Metoprolol Other (See Comments)    GI upset  . Oxycodone Other (See Comments)    Hallucinations (also with Oxycontin)  . Tape Other (See Comments)    TAPE, especially PLASTIC, PULLS OFF THE SKIN!!    Patient Measurements: Height: 5\' 1"  (154.9 cm) Weight: 152 lb 1.9 oz (69 kg) IBW/kg (Calculated) : 47.8  Vital Signs: Temp: 98.6 F (37 C) (09/20 0853) Temp Source: Oral (09/20 0853) BP: 110/93 (09/20 1230) Pulse Rate: 68 (09/20 1230)  Labs: Recent Labs    12/03/17 0600  12/03/17 0830 12/04/17 0631 12/05/17 0711 12/05/17 0900 12/05/17 1015  HGB 7.8*  --   --  8.1* 8.9*  --   --   HCT 24.4*  --   --  25.3* 27.6*  --   --   PLT 210  --   --  200 304  --   --   LABPROT  --   --   --  15.9* >90.0*  --   --   INR  --   --   --  1.28 >10.00*  --   --   HEPARINUNFRC 0.67   < >  --  0.61 >2.20*  --  0.49  CREATININE  --   --  5.41*  --   --  4.40*  --    < > = values in this interval not displayed.    Estimated Creatinine Clearance: 8 mL/min (A) (by C-G formula based on SCr of 4.4 mg/dL (H)).  Assessment: 84 yo F with history of Afib taken off of Coumadin (~2014) due to bleeding from fistula and fall risk.  Now with acute DVT and patient to restart Coumadin.  She is also on IV heparin bridge.  Today is day#3 of 5 minimum overlap for VTE.  Initial labs reported heparin level > 2.2 and INR >10 which were unlikely.  Heparin level was repeated for confirmation and was therapeutic at 0.49 on 850 units/hr.  Will need to repeat INR ~ 4 hours after completion of dialysis.  If INR was drawn during HD it would  be falsely high.  FOB positive on admission but no overt bleeding; transfuse if hemoglobin drops further per MD.  Albumin low.  Noted plans for palliative consult.  Will need to follow-up anticoag plans.  Goal of Therapy:  Heparin level goal 0.3-0.7 INR 2 - 3  Monitor CBC and platelets per protocol.   Plan:  Continue heparin at 850 units/hr. INR scheduled for 1700 - will dose Coumadin based on result Daily heparin level, CBC, and PT / INR   [employer], Pharm.D., BCPS Clinical Pharmacist Pager: [phone] Clinical phone for 12/05/2017 from 8:30-4:00 is [phone].  **Pharmacist phone directory can now be found on [email or web address] ([email or web address]).  Listed under [place].  12/05/2017 1:20 PM

## 2017-12-05 NOTE — Progress Notes (Signed)
CRITICAL VALUE ALERT  Critical Value:  INR  10.00  Date & Time Notied: 12-05-2017  Provider Notified:[place]  Orders Received/Actions taken: pending

## 2017-12-05 NOTE — Consult Note (Signed)
Consultation Note Date: 12/05/2017   Patient Name: [name]  DOB: 1928-08-08  MRN: [id number]  Age / Sex: 84 y.o., female  PCP: Binnie Rail, MD Referring Physician: Modena Jansky, MD  Reason for Consultation: Establishing goals of care and Psychosocial/spiritual support  HPI/Patient Profile: 84 y.o. female  with past medical history of A. fib, heart failure, type 2 diabetes, end-stage renal disease with hemodialysis, high blood pressure and cholesterol, hypothyroidism, history of tib fib fracture right April 2014 admitted on 12/01/2017 with acute encephalopathy..   Clinical Assessment and Goals of Care: Arrived that [name]'s room, she is in hemodialysis at this time.  Call to daughter, Myratte "[name]", [place].  She is a case Freight forwarder with the [employer] system at [employer] tells me multiple times that she is not interested in palliative consult, she does not feel that she would benefit, has the need for palliative discussions.  [name] goes further to endorse that she is experienced, and when she is ready for her mother to transition to comfort measures, residential hospice, she will work with the attending physician who can then make referral.  [name] seems very distracted during our conversation, asking me to repeat statements.  [name] shares that she is, at this point, not ready to transition her mother to hospice.  She states that she wants to talk to HD today to see how her mother tolerated treatment.   [name] tells me that she discussed with staff that "I really didn't need palliative".  [name] tells me that she is waiting for her brother to arrive.  She shares that she wants her brother to see their mother, be involved in decision-making.  [name] asks for consultation with some of her coworkers, namely NP Wadie Lessen or Dr. Rowe Pavy.  I share that Stanton Kidney is not available, but I  will discuss follow-up with Dr. Rowe Pavy.  Conversation with hospitalist, Dr. Algis Liming related to residential hospice referral, goals of care. Conference with nursing staff related to goals of care, patient needs. Call to social work to discuss plan of care, residential hospice referral on hold.  Health care power of attorney NEXT OF KIN   SUMMARY OF RECOMMENDATIONS   24 to 48 hours for outcomes. Daughter cookie states she wants to follow-up with her brother, see how her mother tolerated hemodialysis  Code Status/Advance Care Planning:  DNR  Symptom Management:   Per hospitalist, no additional needs at this time.  When transition to be can place, symptom management per hospice protocol.  Palliative Prophylaxis:   Aspiration, Frequent Pain Assessment and Turn Reposition  Additional Recommendations (Limitations, Scope, Preferences):  Treat the treatable but no CPR, no intubation, continue hemodialysis at this time.  Psycho-social/Spiritual:   Desire for further Chaplaincy support:no  Additional Recommendations: Caregiving  Support/Resources and Education on Hospice  Prognosis:   < 2 weeks, is expected when hemodialysis is discontinued.  Discharge Planning: To be determined, initial discussions were for residential hospice, at this point patient family is undecided.  Primary Diagnoses: Present on Admission: . Acute encephalopathy . PAF (paroxysmal atrial fibrillation) ([place]) . Memory loss . Hypothyroidism . HTN (hypertension) . Chronic combined systolic and diastolic heart failure EF 45-50% by echo 06/25/2012 . Breast cancer, s/p Lt mastectomy . Anemia   I have reviewed the medical record, interviewed the patient and family, and examined the patient. The following aspects are pertinent.  Past Medical History:  Diagnosis Date  . Arthritis   . Atrial fibrillation ([place]) 06/25/2012   2D Echo - EF 45-50, left atrium severely dilated, mitral valve heavily  calcified annulus with calcification of the subvalvular apparatus - no longer on Coumadin  . Breast cancer ([place])   . CHF (congestive heart failure) ([place])   . DM2 (diabetes mellitus, type 2) ([place])   . ESRD (end stage renal disease) ([place])    HD MWF - [facility]  . HTN (hypertension)   . Hyperlipidemia   . Hyperparathyroidism, secondary renal ([place])   . Hypothyroid   . Right tibial fracture 06/21/2012  . Shortness of breath   . Tibia/fibula fracture 07/10/2011   right - nonunion repair 06/2012   Social History   Socioeconomic History  . Marital status: Single    Spouse name: Not on file  . Number of children: Not on file  . Years of education: Not on file  . Highest education level: Not on file  Occupational History  . Not on file  Social Needs  . Financial resource strain: Not on file  . Food insecurity:    Worry: Not on file    Inability: Not on file  . Transportation needs:    Medical: Not on file    Non-medical: Not on file  Tobacco Use  . Smoking status: Never Smoker  . Smokeless tobacco: Current User    Types: Snuff  Substance and Sexual Activity  . Alcohol use: No  . Drug use: No  . Sexual activity: Never    Birth control/protection: Post-menopausal  Lifestyle  . Physical activity:    Days per week: Not on file    Minutes per session: Not on file  . Stress: Not on file  Relationships  . Social connections:    Talks on phone: Not on file    Gets together: Not on file    Attends religious service: Not on file    Active member of club or organization: Not on file    Attends meetings of clubs or organizations: Not on file    Relationship status: Not on file  Other Topics Concern  . Not on file  Social History Narrative  . Not on file   Family History  Problem Relation Age of Onset  . Hyperlipidemia Father   . Diabetes Father   . Breast cancer Sister    Scheduled Meds: . Chlorhexidine Gluconate Cloth  6 each Topical Q0600  . darbepoetin (ARANESP) injection  - DIALYSIS  200 mcg Intravenous Q Wed-HD  . famotidine  20 mg Oral Daily  . feeding supplement (PRO-STAT SUGAR FREE 64)  30 mL Oral BID  . levothyroxine  88 mcg Oral QAC breakfast  . pantoprazole  40 mg Oral Daily  . sevelamer carbonate  800 mg Oral TID WC  . thiamine  500 mg Oral Daily  . Warfarin - Pharmacist Dosing Inpatient   Does not apply q1800   Continuous Infusions: . sodium chloride    . sodium chloride    . heparin 850 Units/hr (12/04/17 0119)   PRN Meds:.sodium chloride, sodium  chloride, acetaminophen **OR** acetaminophen (TYLENOL) oral liquid 160 mg/5 mL **OR** acetaminophen, alteplase, heparin, lidocaine (PF), lidocaine-prilocaine, loperamide, pentafluoroprop-tetrafluoroeth Medications Prior to Admission:  Prior to Admission medications   Medication Sig Start Date End Date Taking? Authorizing Provider  acetaminophen (TYLENOL) 325 MG tablet Take 2 tablets (650 mg total) by mouth every 6 (six) hours as needed for mild pain or moderate pain. 08/17/16  Yes Mariel Aloe, MD  aspirin EC 81 MG tablet Take 81 mg by mouth daily.   Yes [provider]  Cholecalciferol (VITAMIN D-3 PO) Take 1 capsule by mouth daily.   Yes [provider]  darbepoetin (ARANESP) 100 MCG/0.5ML SOLN Inject 0.5 mLs (100 mcg total) into the vein every Wednesday with hemodialysis. 07/25/12  Yes Kilroy, Luke K, PA-C  glipiZIDE (GLUCOTROL) 5 MG tablet Take 1 tablet by mouth at bedtime.  10/14/17  Yes [provider]  levothyroxine (SYNTHROID, LEVOTHROID) 88 MCG tablet Take 88 mcg by mouth daily before breakfast.   Yes [provider]  nebivolol (BYSTOLIC) 10 MG tablet Take 1 tablet (10 mg total) by mouth daily. 10/30/17  Yes Burns, Claudina Lick, MD  omeprazole (PRILOSEC) 20 MG capsule TAKE ONE CAPSULE BY MOUTH IN THE EVENING FOR GERD Patient taking differently: Take 20 mg by mouth daily.  12/20/16  Yes Binnie Rail, MD  aspirin EC 325 MG tablet Take 1 tablet (325 mg total) by mouth  daily. For 30 days post op for DVT Prophylaxis.  Resume 81 mg Aspirin after this has been completed. Patient not taking: Reported on 12/02/2017 08/15/16   Prudencio Burly III, PA-C  b complex-vitamin c-folic acid (NEPHRO-VITE) 0.8 MG TABS tablet Take 1 tablet by mouth at bedtime. Patient not taking: Reported on 12/02/2017 08/29/15   Binnie Rail, MD  diclofenac sodium (VOLTAREN) 1 % GEL APPLY 2 GRAMS TOPICALLY EVERY SHIFT FOR PAIN Patient not taking: Reported on 12/02/2017 12/20/16   Binnie Rail, MD  doxycycline (VIBRA-TABS) 100 MG tablet Take 1 tablet (100 mg total) by mouth 2 (two) times daily. Patient not taking: Reported on 12/02/2017 10/30/17   Binnie Rail, MD  levothyroxine (SYNTHROID, LEVOTHROID) 75 MCG tablet TAKE 1 TABLET BY MOUTH ONCE DAILY FOR HYPOTHYROIDISM Patient not taking: Reported on 12/02/2017 12/20/16   Binnie Rail, MD  methocarbamol (ROBAXIN) 500 MG tablet Take 1 tablet (500 mg total) by mouth every 6 (six) hours as needed for muscle spasms. Patient not taking: Reported on 12/02/2017 08/17/16   Mariel Aloe, MD  Nutritional Supplements (FEEDING SUPPLEMENT, NEPRO CARB STEADY,) LIQD Take 237 mLs by mouth 3 (three) times daily as needed (Supplement). Patient not taking: Reported on 12/02/2017 08/17/16   Mariel Aloe, MD  potassium chloride SA (K-DUR,KLOR-CON) 20 MEQ tablet Take 2 tablets (40 mEq total) by mouth once for 1 dose. Patient not taking: Reported on 12/02/2017 10/30/17 12/02/17  Binnie Rail, MD  pravastatin (PRAVACHOL) 20 MG tablet Take 1 tablet (20 mg total) by mouth daily. -- Office visit needed for further refills Patient not taking: Reported on 12/02/2017 09/22/17   Binnie Rail, MD  sevelamer carbonate (RENVELA) 800 MG tablet TAKE 1 TABLET BY MOUTH DAILY WITH A MEAL Patient not taking: Reported on 12/02/2017 10/16/17   Binnie Rail, MD   Allergies  Allergen Reactions  . Codeine Shortness Of Breath, Swelling and Other (See Comments)    Swelling of the  tongue, but patient tolerates Vicodin  . Coreg [Carvedilol] Other (See Comments)  Headaches   . Lopressor [Metoprolol Tartrate] Other (See Comments)    GI upset  . Metoprolol Other (See Comments)    GI upset  . Oxycodone Other (See Comments)    Hallucinations (also with Oxycontin)  . Tape Other (See Comments)    TAPE, especially PLASTIC, PULLS OFF THE SKIN!!   Review of Systems  Unable to perform ROS: Age    Physical Exam  Pulmonary/Chest: Effort normal.  Nursing note and vitals reviewed.   Vital Signs: BP (!) 110/93   Pulse 68   Temp 98.6 F (37 C) (Oral)   Resp 20   Ht 5\' 1"  (1.549 m)   Wt 69 kg   SpO2 100%   BMI 28.74 kg/m  Pain Scale: 0-10   Pain Score: 0-No pain   SpO2: SpO2: 100 % O2 Device:SpO2: 100 % O2 Flow Rate: .O2 Flow Rate (L/min): 2 L/min  IO: Intake/output summary:   Intake/Output Summary (Last 24 hours) at 12/05/2017 1302 Last data filed at 12/05/2017 0700 Gross per 24 hour  Intake 222 ml  Output -  Net 222 ml    LBM: Last BM Date: 12/04/17 Baseline Weight: Weight: 52.7 kg Most recent weight: Weight: 69 kg     Palliative Assessment/Data:   Flowsheet Rows     Most Recent Value  Intake Tab  Referral Department  Hospitalist  Unit at Time of Referral  Cardiac/Telemetry Unit  Palliative Care Primary Diagnosis  Nephrology  Date Notified  12/04/17  Palliative Care Type  New Palliative care  Reason for referral  Advance Care Planning, Clarify Goals of Care, Counsel Regarding Hospice  Date of Admission  12/01/17  Date first seen by Palliative Care  12/05/17  # of days Palliative referral response time  1 Day(s)  # of days IP prior to Palliative referral  3  Clinical Assessment  Palliative Performance Scale Score  40%  Pain Max last 24 hours  Not able to report  Pain Min Last 24 hours  Not able to report  Dyspnea Max Last 24 Hours  Not able to report  Dyspnea Min Last 24 hours  Not able to report  Psychosocial & Spiritual Assessment    Palliative Care Outcomes  Patient/Family meeting held?  Yes  Who was at the meeting?  daughter [name] via phone  Patient/Family wishes: Interventions discontinued/not started   Mechanical Ventilation      Time In: 1220 Time Out: 1310 Time Total: 50 minutes Greater than 50%  of this time was spent counseling and coordinating care related to the above assessment and plan.  Signed by: Drue Novel, NP   Please contact Palliative Medicine Team phone at [phone] for questions and concerns.  For individual provider: See Shea Evans

## 2017-12-05 NOTE — Progress Notes (Signed)
[facility] Progress Note   Subjective:  Seen on HD, still confused  Objective Vitals:   12/05/17 0913 12/05/17 0930 12/05/17 1000 12/05/17 1030  BP: (!) 114/44 (!) 102/52 (!) 77/51 (!) 126/92  Pulse: 74 82 (!) 50 (!) 46  Resp:      Temp:      TempSrc:      SpO2:      Weight:      Height:       Physical Exam General: Frail female, confused, room air Heart: RRR; 2/6 systolic murmur Lungs: CTA anteriorly Abdomen: soft, non-tender Extremities: 1+ bilat LE edema, skin darkening of entire R lower leg, looks old,  no LLE edema Dialysis Access: R AVF + bruit  Additional Objective Labs: Basic Metabolic Panel: Recent Labs  Lab 12/01/17 1955 12/02/17 0648 12/03/17 0830 12/05/17 0900  NA 139  --  139 136  K 3.7  --  3.8 3.5  CL 99  --  100 100  CO2 24  --  26 24  GLUCOSE 148*  --  65* 135*  BUN 13  --  20 18  CREATININE 4.06* 4.40* 5.41* 4.40*  CALCIUM 8.0*  --  7.9* 8.1*  PHOS  --   --  3.5 2.7   Liver Function Tests: Recent Labs  Lab 12/01/17 1955 12/03/17 0830 12/05/17 0900  AST 35  --   --   ALT 16  --   --   ALKPHOS 215*  --   --   BILITOT 0.9  --   --   PROT 5.8*  --   --   ALBUMIN 2.3* 1.7* 1.9*   CBC: Recent Labs  Lab 12/01/17 2057 12/02/17 0648 12/03/17 0600 12/04/17 0631 12/05/17 0711  WBC 9.4 9.0 9.1 11.6* 12.8*  NEUTROABS 8.0*  --   --   --   --   HGB 9.1* 8.7* 7.8* 8.1* 8.9*  HCT 28.9* 27.7* 24.4* 25.3* 27.6*  MCV 87.6 88.5 86.2 86.3 87.1  PLT 171 174 210 200 304   Blood Culture    Component Value Date/Time   SDES BLOOD LEFT HAND 12/02/2017 0640   SPECREQUEST  12/02/2017 0640    BOTTLES DRAWN AEROBIC ONLY Blood Culture adequate volume   CULT  12/02/2017 0640    NO GROWTH 3 DAYS Performed at [name], [address], [place], [place] [address]    REPTSTATUS PENDING 12/02/2017 6045   Studies/Results: No results found. Medications: . sodium chloride    . sodium chloride    . heparin 850 Units/hr  (12/04/17 0119)   . Chlorhexidine Gluconate Cloth  6 each Topical Q0600  . darbepoetin (ARANESP) injection - DIALYSIS  200 mcg Intravenous Q Wed-HD  . famotidine  20 mg Oral Daily  . feeding supplement (PRO-STAT SUGAR FREE 64)  30 mL Oral BID  . levothyroxine  88 mcg Oral QAC breakfast  . pantoprazole  40 mg Oral Daily  . sevelamer carbonate  800 mg Oral TID WC  . thiamine  500 mg Oral Daily  . Warfarin - Pharmacist Dosing Inpatient   Does not apply q1800    Dialysis Orders: [facility] 3.5h  350/600  54.5kg   4K/2.25 bath  AVF  Hep none - S/p recent f'gram 9/12. - Mircera 16mcg IV q 2 wks (last 9/4), Venofer 50mg  IV weekly - Calcitriol 0.45mcg PO q HD  Assessment/Plan: 1.  AMS: not really better. Negative head CT and MRI. Per primary the family's plan is  to anticoag for #2 if pt's MS improves.  If not they will likely pursue hospice care.  2.  Acute RLE DVT: Fall risk, + FOBT. As above.  3.  Diarrhea: better 4.  ESRD: MWF HD.  HD today  5.  HTN/volume: BP controlled. No pulm edema. Below outpt EDW - low goal. 6.  Anemia: Hgb 8-9 range, sp 2u prbcs. Re-dosed Aranesp (248mcg) on wed 7.  Metabolic bone disease: Ca/Phos ok, will follow labs. 8.  Nutrition: Alb low, adding pro-stat supps. 9.  Type 2 DM 10.   Hx A-fib  Kelly Splinter MD [employer] pgr [phone]   12/05/2017, 11:31 AM

## 2017-12-06 DIAGNOSIS — Z7189 Other specified counseling: Secondary | ICD-10-CM

## 2017-12-06 LAB — TYPE AND SCREEN
ABO/RH(D): A POS
Antibody Screen: POSITIVE
DONOR AG TYPE: NEGATIVE
DONOR AG TYPE: NEGATIVE
PT AG Type: NEGATIVE
UNIT DIVISION: 0
UNIT DIVISION: 0

## 2017-12-06 LAB — CBC
HEMATOCRIT: 38 % (ref 36.0–46.0)
Hemoglobin: 12.4 g/dL (ref 12.0–15.0)
MCH: 28.1 pg (ref 26.0–34.0)
MCHC: 32.6 g/dL (ref 30.0–36.0)
MCV: 86 fL (ref 78.0–100.0)
PLATELETS: 209 10*3/uL (ref 150–400)
RBC: 4.42 MIL/uL (ref 3.87–5.11)
RDW: 21.5 % — AB (ref 11.5–15.5)
WBC: 11.7 10*3/uL — ABNORMAL HIGH (ref 4.0–10.5)

## 2017-12-06 LAB — PROTIME-INR
INR: 2.43
Prothrombin Time: 26.2 seconds — ABNORMAL HIGH (ref 11.4–15.2)

## 2017-12-06 LAB — BPAM RBC
BLOOD PRODUCT EXPIRATION DATE: 201910012359
Blood Product Expiration Date: 201910052359
UNIT TYPE AND RH: 5100
Unit Type and Rh: 600

## 2017-12-06 LAB — GLUCOSE, CAPILLARY
GLUCOSE-CAPILLARY: 35 mg/dL — AB (ref 70–99)
Glucose-Capillary: 30 mg/dL — CL (ref 70–99)
Glucose-Capillary: 72 mg/dL (ref 70–99)

## 2017-12-06 LAB — HEPARIN LEVEL (UNFRACTIONATED): Heparin Unfractionated: 0.52 IU/mL (ref 0.30–0.70)

## 2017-12-06 MED ORDER — LORAZEPAM 2 MG/ML IJ SOLN: 0.5000 mg | Freq: Four times a day (QID) | INTRAMUSCULAR | Status: DC | PRN

## 2017-12-06 MED ORDER — FENTANYL CITRATE (PF) 100 MCG/2ML IJ SOLN
12.5000 ug | INTRAMUSCULAR | Status: DC | PRN
  Administered 2017-12-06 – 2017-12-08 (×5): 12.5 ug via INTRAVENOUS
  Filled 2017-12-06 (×6): qty 2

## 2017-12-06 MED ORDER — WARFARIN SODIUM 1 MG PO TABS
1.0000 mg | ORAL_TABLET | Freq: Once | ORAL | Status: DC
  Filled 2017-12-06: qty 1

## 2017-12-06 MED ORDER — DEXTROSE 50 % IV SOLN
INTRAVENOUS | Status: AC
  Administered 2017-12-06: 09:00:00
  Filled 2017-12-06: qty 50

## 2017-12-06 NOTE — Progress Notes (Signed)
12/06/17 1600  Clinical Encounter Type  Visited With Patient and family together  Visit Type Initial  Referral From Nurse  Spiritual Encounters  Spiritual Needs Prayer  Stress Factors  Patient Stress Factors Major life changes  Responded to [place]  consult for end of life. Met with patient and family at bedside. Daughter is preparing to have mother transferred to [facility]. Very nice family. Provided spiritual care with the [facility] of [facility]. 

## 2017-12-06 NOTE — Progress Notes (Signed)
[facility] Progress Note   Subjective:  Seen in room, very sleepy, sleeps most of the day. Awakens and interacts intermittently.  Confused.   Objective Vitals:   12/05/17 1245 12/05/17 2135 12/06/17 0429 12/06/17 0657  BP: (!) 96/54 126/82  (!) 121/41  Pulse: 89 74  79  Resp: '19 16  16  '$ Temp: 98 F (36.7 C) 98 F (36.7 C)  98.1 F (36.7 C)  TempSrc: Oral Oral  Oral  SpO2: 99% 99%  96%  Weight: 69 kg  67 kg   Height:       Physical Exam General: Frail female, somnolent but arousable, no distress Heart: RRR; 2/6 systolic murmur Lungs: CTA anteriorly Abdomen: soft, non-tender Extremities: trace RLE edema, no LLE edema Dialysis Access: R AVF + bruit  Additional Objective Labs: Basic Metabolic Panel: Recent Labs  Lab 12/01/17 1955 12/02/17 0648 12/03/17 0830 12/05/17 0900  NA 139  --  139 136  K 3.7  --  3.8 3.5  CL 99  --  100 100  CO2 24  --  26 24  GLUCOSE 148*  --  65* 135*  BUN 13  --  20 18  CREATININE 4.06* 4.40* 5.41* 4.40*  CALCIUM 8.0*  --  7.9* 8.1*  PHOS  --   --  3.5 2.7   Liver Function Tests: Recent Labs  Lab 12/01/17 1955 12/03/17 0830 12/05/17 0900  AST 35  --   --   ALT 16  --   --   ALKPHOS 215*  --   --   BILITOT 0.9  --   --   PROT 5.8*  --   --   ALBUMIN 2.3* 1.7* 1.9*   CBC: Recent Labs  Lab 12/01/17 2057 12/02/17 0648 12/03/17 0600 12/04/17 0631 12/05/17 0711 12/06/17 0528  WBC 9.4 9.0 9.1 11.6* 12.8* 11.7*  NEUTROABS 8.0*  --   --   --   --   --   HGB 9.1* 8.7* 7.8* 8.1* 8.9* 12.4  HCT 28.9* 27.7* 24.4* 25.3* 27.6* 38.0  MCV 87.6 88.5 86.2 86.3 87.1 86.0  PLT 171 174 210 200 304 209   Blood Culture    Component Value Date/Time   SDES BLOOD LEFT HAND 12/02/2017 0640   SPECREQUEST  12/02/2017 0640    BOTTLES DRAWN AEROBIC ONLY Blood Culture adequate volume   CULT  12/02/2017 0640    NO GROWTH 4 DAYS Performed at [facility], [place] [address] [place], [place] [address]    REPTSTATUS PENDING  12/02/2017 2637   Studies/Results: No results found. Medications: . heparin 850 Units/hr (12/05/17 1800)   . Chlorhexidine Gluconate Cloth  6 each Topical Q0600  . darbepoetin (ARANESP) injection - DIALYSIS  200 mcg Intravenous Q Wed-HD  . famotidine  20 mg Oral Daily  . feeding supplement (PRO-STAT SUGAR FREE 64)  30 mL Oral BID  . levothyroxine  88 mcg Oral QAC breakfast  . pantoprazole  40 mg Oral Daily  . sevelamer carbonate  800 mg Oral TID WC  . thiamine  500 mg Oral Daily  . warfarin  1 mg Oral ONCE-1800  . Warfarin - Pharmacist Dosing Inpatient   Does not apply q1800    Dialysis Orders: [facility]WF [facility] 3.5h  350/600  54.5kg   4K/2.25 bath  AVF  Hep none - S/p recent f'gram 9/12. - Mircera 153mg IV q 2 wks (last 9/4), Venofer '50mg'$  IV weekly - Calcitriol 0.562m PO q HD  Assessment/Plan: 1.  AMS: sig lethargy and FTT.  Met w/ primary MD and family this am to discuss direction of care. Pt w/ progressive decline per family. She is unlikely to recover given all her comorbidities.  Plan is for transition to comfort care as of our discussion this morning.  2.  Acute RLE DVT 3.  ESRD: MWF HD.  Had HD yesterday.  4.  HTN/volume: BP controlled. No pulm edema. Below outpt EDW - low goal. 5.  Anemia: Hgb 8-9 range, sp 2u prbcs. Re-dosed Aranesp (265mg) on wed 6.  Metabolic bone disease: Ca/Phos ok, will follow labs. 7.  Nutrition: Alb low, adding pro-stat supps. 8.  Type 2 DM 9.   Hx A-fib  RKelly SplinterMD [employer] ([phone]  12/06/2017, 10:44 AM

## 2017-12-06 NOTE — Progress Notes (Signed)
Pt. transfered to 6N. Family made aware. Report given to [facility].

## 2017-12-06 NOTE — Progress Notes (Signed)
Transferred from [place] via bed.

## 2017-12-06 NOTE — Progress Notes (Signed)
Daily Progress Note   Patient Name: [name]       Date: 12/06/2017 DOB: 03/15/1929  Age: 84 y.o. MRN#: [id number] Attending Physician: Modena Jansky, MD Primary Care Physician: Binnie Rail, MD Admit Date: 12/01/2017  Reason for Consultation/Follow-up: Establishing goals of care  Subjective:  patient appears weak and confused she is resting in bed. At time she opens her eyes, does not verbalize much. Denies being in pain. Family present at the bedside. Family meeting as noted below.  Length of Stay: 4  Current Medications: Scheduled Meds:  . Chlorhexidine Gluconate Cloth  6 each Topical Q0600  . feeding supplement (PRO-STAT SUGAR FREE 64)  30 mL Oral BID    Continuous Infusions:   PRN Meds: acetaminophen **OR** acetaminophen (TYLENOL) oral liquid 160 mg/5 mL **OR** acetaminophen, fentaNYL (SUBLIMAZE) injection, loperamide, LORazepam  Physical Exam         Frail, weak appearing elderly lady resting in bed in Does not appear to be in any acute distress Diminished breath sounds S1-S2 Patient has some edema right lower extremity, patient has hyperpigmentation, scars, some blisters. No open wounds Abdomen is not distended Appears to have generalized weakness  Vital Signs: BP (!) 121/41 (BP Location: Left Arm)   Pulse 79   Temp 98.1 F (36.7 C) (Oral)   Resp 16   Ht 5\' 1"  (1.549 m)   Wt 67 kg   SpO2 96%   BMI 27.91 kg/m  SpO2: SpO2: 96 % O2 Device: O2 Device: Room Air O2 Flow Rate: O2 Flow Rate (L/min): 2 L/min  Intake/output summary:   Intake/Output Summary (Last 24 hours) at 12/06/2017 1404 Last data filed at 12/05/2017 1800 Gross per 24 hour  Intake 243.48 ml  Output -  Net 243.48 ml   LBM: Last BM Date: 12/06/17(small) Baseline Weight: Weight: 52.7  kg Most recent weight: Weight: 67 kg       Palliative Assessment/Data:    Flowsheet Rows     Most Recent Value  Intake Tab  Referral Department  Hospitalist  Unit at Time of Referral  Cardiac/Telemetry Unit  Palliative Care Primary Diagnosis  Nephrology  Date Notified  12/04/17  Palliative Care Type  New Palliative care  Reason for referral  Advance Care Planning, Clarify Goals of Care, Counsel Regarding Hospice  Date of Admission  12/01/17  Date first seen by Palliative Care  12/05/17  # of days Palliative referral response time  1 Day(s)  # of days IP prior to Palliative referral  3  Clinical Assessment  Palliative Performance Scale Score  40%  Pain Max last 24 hours  Not able to report  Pain Min Last 24 hours  Not able to report  Dyspnea Max Last 24 Hours  Not able to report  Dyspnea Min Last 24 hours  Not able to report  Psychosocial & Spiritual Assessment  Palliative Care Outcomes  Patient/Family meeting held?  Yes  Who was at the meeting?  daughter [name] via phone  Patient/Family wishes: Interventions discontinued/not started   Mechanical Ventilation      Patient Active Problem List   Diagnosis Date Noted  . Goals of care, counseling/discussion   . Palliative care by specialist   . Encounter for hospice care discussion   . Acute encephalopathy 12/02/2017  . Poor balance 10/30/2017  . Physical deconditioning 10/30/2017  . Frequent falls 10/30/2017  . Cough 10/30/2017  . Leg fracture 08/13/2016  . Hypothyroidism 08/30/2015  . Labile blood glucose   . Memory loss   . Hip fracture ([place]) 07/12/2015  . ESRD on dialysis ([place])   . PAF (paroxysmal atrial fibrillation) ([place])   . Chronic combined systolic and diastolic heart failure EF 45-50% by echo 06/25/2012 07/30/2012  . Breast cancer, s/p Lt mastectomy 07/22/2012  . Pleural effusion due to CHF (congestive heart failure) ([place]) 07/20/2012  . Hypotension, unspecified 06/25/2012  . Metabolic bone disease  41/93/7902  . Secondary hyperparathyroidism of renal origin ([place]) 06/19/2012  . Nonunion of fracture, R tibia 06/17/2012  . H/O radioactive iodine thyroid ablation   . Closed fracture of right fibula and tibia 07/10/2011  . HTN (hypertension) 07/10/2011  . DM2 (diabetes mellitus, type 2) ([place]) 07/10/2011  . Hyperlipidemia 07/10/2011  . Anemia 07/10/2011    Palliative Care Assessment & Plan   Patient Profile:    Assessment:  end-stage renal disease patient was on Monday Wednesday Friday hemodialysis for the past 8 years now. Admitted this time with acute right lower extremity deep vein thrombosis Generalized decline, recurrent falls, progressive decline for the past few months History of diabetes Atrial fibrillation History of breast cancer status post mastectomy Memory loss Hypertension Noted to have history of chronic combined systolic and diastolic heart failure ejection fraction 45-50 percent by surface echocardiogram 2014   Recommendations/Plan:  Family meeting with patient's children son and daughter present at the bedside. Granddaughter was also present. We reviewed scope of current hospitalization. We discussed about the patient's underlying serious illnesses. Brief life review also performed. Patient has 2 children a son and her daughter both present at the bedside. Patient's daughter is our Social worker, works in Armed forces operational officer. Patient's lifestyle was such that she would live in [place] with her son during winter months and she would come to [place] to live with her daughter for the rest of the year. She has been having a lot of falls recently. She has had diminished strength, functional decline, cognitive decline, declining oral intake.   Family is appreciative of the information they have received from renal as well as hospital medicine colleagues. While this has been a very difficult decision, they wish to proceed with establishing full scope of comfort measures,  additionally, the type of care that can be provided at a residential hospice setting also discussed in detail.   At times the patient does appear to  be morning, in mild distress. She is not eating much of anything. As for symptom management, will start IV fentanyl and IV Ativan when necessary.   A social work consultation has been requested for residential hospice placement. Choices discussed. Most of the family is here in [place], [place], choice made for hospice and palliative care center of [facility] place.   Continue supportive comfort measures. All of the family's questions answered to the best of my ability.   Will request chaplain consult, spiritual care for additional support for the family at this time. Prognosis likely less than 2 weeks at this time.  Goals of Care and Additional Recommendations:  Limitations on Scope of Treatment: Full Comfort Care  Code Status:    Code Status Orders  (From admission, onward)         Start     Ordered   12/02/17 0135  Do not attempt resuscitation (DNR)  Continuous    Question Answer Comment  In the event of cardiac or respiratory ARREST Do not call a "code blue"   In the event of cardiac or respiratory ARREST Do not perform Intubation, CPR, defibrillation or ACLS   In the event of cardiac or respiratory ARREST Use medication by any route, position, wound care, and other measures to relive pain and suffering. May use oxygen, suction and manual treatment of airway obstruction as needed for comfort.      12/02/17 0138        Code Status History    Date Active Date Inactive Code Status Order ID Comments User Context   12/02/2017 0027 12/02/2017 0138 DNR [id number]  Pattricia Boss, MD ED   08/13/2016 2354 08/17/2016 2034 Full Code [id number]  Karmen Bongo, MD Inpatient   07/12/2015 1510 07/28/2015 2037 Full Code [id number]  Cathlyn Parsons, PA-C Inpatient   06/25/2012 0440 06/27/2012 0038 Full Code [id number]  Toy Baker, MD Inpatient    Advance Directive Documentation     Most Recent Value  Type of Advance Directive  Healthcare Power of Attorney  Pre-existing out of facility DNR order (yellow form or pink MOST form)  -  "MOST" Form in Place?  -      PPS 20% Prognosis:   < 2 weeks  Discharge Planning:  Hospice facility  Care plan was discussed with  Patient, son daughter and grand daughter present at bedside.   Thank you for allowing the Palliative Medicine Team to assist in the care of this patient.   Time In: 1330 Time Out: 1405 Total Time 35 Prolonged Time Billed  no       Greater than 50%  of this time was spent counseling and coordinating care related to the above assessment and plan.  Loistine Chance, MD [phone]  Please contact Palliative Medicine Team phone at [phone] for questions and concerns.

## 2017-12-06 NOTE — Progress Notes (Signed)
ANTICOAGULATION CONSULT NOTE - Follow-Up  Pharmacy Consult:  Heparin > Coumadin Indication: DVT  Allergies  Allergen Reactions  . Codeine Shortness Of Breath, Swelling and Other (See Comments)    Swelling of the tongue, but patient tolerates Vicodin  . Coreg [Carvedilol] Other (See Comments)    Headaches   . Lopressor [Metoprolol Tartrate] Other (See Comments)    GI upset  . Metoprolol Other (See Comments)    GI upset  . Oxycodone Other (See Comments)    Hallucinations (also with Oxycontin)  . Tape Other (See Comments)    TAPE, especially PLASTIC, PULLS OFF THE SKIN!!    Patient Measurements: Height: 5\' 1"  (154.9 cm) Weight: 147 lb 11.3 oz (67 kg) IBW/kg (Calculated) : 47.8  Vital Signs: Temp: 98.1 F (36.7 C) (09/21 0657) Temp Source: Oral (09/21 0657) BP: 121/41 (09/21 0657) Pulse Rate: 79 (09/21 0657)  Labs: Recent Labs    12/03/17 0830  12/04/17 0631 12/05/17 0711 12/05/17 0900 12/05/17 1015 12/05/17 2020 12/06/17 0528  HGB  --    < > 8.1* 8.9*  --   --   --  12.4  HCT  --   --  25.3* 27.6*  --   --   --  38.0  PLT  --   --  200 304  --   --   --  209  LABPROT  --    < > 15.9* >90.0*  --   --  27.0* 26.2*  INR  --    < > 1.28 >10.00*  --   --  2.52 2.43  HEPARINUNFRC  --   --  0.61 >2.20*  --  0.49  --  0.52  CREATININE 5.41*  --   --   --  4.40*  --   --   --    < > = values in this interval not displayed.    Estimated Creatinine Clearance: 7.9 mL/min (A) (by C-G formula based on SCr of 4.4 mg/dL (H)).  Assessment: 84 yo F with history of Afib taken off of Coumadin (~2014) due to bleeding from fistula and fall risk.  Now with acute DVT and patient to restart Coumadin.  FOBT+ on admit. She is also on IV heparin bridge.  Today is day#4 of 5 minimum overlap for VTE.  9/21: INR this morning therapeutic at 2.43 after dose held yesterday d/t rapid increase in INR (1.28>>>2.52). Heparin level this morning therapeutic at 0.52. Hgb 12.4, plt 209; no bleeding  noted. Continue to bridge.  Goal of Therapy:  Heparin level goal 0.3-0.7 INR 2 - 3  Monitor CBC and platelets per protocol.   Plan:  Warfarin 1mg  x1 today at 1800 No heparin changes warranted Daily heparin level, CBC, and PT / INR  Thank you for involving pharmacy in this patient's care.  Janae Bridgeman, PharmD PGY1 Pharmacy Resident Phone: [phone] 12/06/2017 7:33 AM

## 2017-12-06 NOTE — Plan of Care (Signed)
  Problem: Education: Goal: Knowledge of General Education information will improve Description Including pain rating scale, medication(s)/side effects and non-pharmacologic comfort measures Outcome: Progressing Note:  POC reviewed with family and pt.; unsure how much pt. understands.

## 2017-12-06 NOTE — Progress Notes (Signed)
PROGRESS NOTE   [name]  EHU:[id number]    DOB: 09-19-1928    DOA: 12/01/2017  PCP: Binnie Rail, MD   I have briefly reviewed patients [facility] in [facility].  Brief Narrative:  84 year old female with PMH of ESRD on MWF HD, HTN, DM 2, HLD, chronic systolic CHF, hypothyroid, A. fib on anticoagulation, progressively declining for the last couple of months, was brought to the ED 12/02/2017 after she became increasingly confused during HD, 2 days history of increasing difficulty moving right lower extremity, having frequent falls, on and off diarrhea for 3 to 4 days and poor appetite.  In ED, mildly hypotensive with elevated lactate, improved after IV saline bolus, noted new right lower extremity swelling.  CTA chest was negative for PE, CT head unremarkable.  Admitted for acute encephalopathy, right lower extremity swelling which is now diagnosed with DVT.  After extensive discussion with daughter, initiated IV heparin drip and Coumadin bridging.  Nephrology following for dialysis needs. On 9/21, I met with patient's daughter and son along with Dr. Melvia Heaps, Nephrology for goals of care family meeting.  Discussed in detail regarding inpatient evaluation and care, persisting mental status changes, poor oral intake, recurrent hypoglycemia, overall poor prognosis.  Family opted to transition to full comfort care, DC all measures not pertaining to comfort including HD.  Clinical social work consulted for residential hospice placement.   Assessment & Plan:   Principal Problem:   Acute encephalopathy Active Problems:   HTN (hypertension)   DM2 (diabetes mellitus, type 2) (HCC)   Anemia   Breast cancer, s/p Lt mastectomy   Chronic combined systolic and diastolic heart failure EF 45-50% by echo 06/25/2012   ESRD on dialysis ([place])   PAF (paroxysmal atrial fibrillation) ([facility])   Memory loss   Hypothyroidism   Goals of care, counseling/discussion   Palliative care by  specialist   Encounter for hospice care discussion   Acute right lower extremity DVT/Chronic LLE DVT: Initially felt to have cellulitis and had been empirically started on antibiotics.  Lower extremity venous Dopplers 9/17 showed acute DVT involving the right posterior tibial vein and right peroneal vein and chronic DVT in the left peroneal vein.  She is deemed to be high risk patient for bleeding complications given advanced age, overall physical and mental decline over the last couple of months, fall risk, FOBT +, has been taken off Coumadin in the past for bleeding.  This was discussed in detail with patient's daughter by my partner Dr. Eliseo Squires on 9/17 and by me on 9/18 regarding options of aggressive management with anticoagulation with IV heparin bridge and Coumadin versus IVC filter.  Patient's daughter who is a case Freight forwarder at [employer] long decided in favor of IV heparin drip and Coumadin.  She hopes that patient will improve enough to take her home and be able to continue her outpatient dialysis.  However if patient unable to participate in outpatient dialysis, daughter is considering hospice.  She declines palliative care consultation at this time and indicates that she is well aware of what the palliative care team is going to say and she is well educated to make decisions by herself.  Started IV heparin 9/17.  Started Coumadin per pharmacy 9/18.  Clinically patient's right lower extremity findings are not consistent with cellulitis.  Discontinued empirically started vancomycin and cefepime.  INR therapeutic today.  However family today has transitioned patient to full comfort care.  Discontinue anticoagulation and all medications nonessential to  comfort.  One set of blood cultures showed gram-positive rods.  I discussed with pharmacy in detail.  They recommend that this is likely diphtheroids/contaminant and no indication to use resume antibiotics.  Acute encephalopathy: Unclear etiology.  CT head and  MRI brain without acute abnormalities.  No obvious infectious etiology identified.  TSH normal 8/15.  Checking thiamine level and started empiric thiamine supplementation.  Delirium precautions.  Minimize sedative medications.  Reorient.  Persisting mental status changes without improvement since admission.  Now comfort care.  Diarrhea. chronic: Unclear etiology.  Ultrasound abdomen without acute findings.  Abdomen benign on exam. PRN Imodium.  Low index of suspicion for C. difficile.  ESRD on MWF HD: Nephrology following.  As per daughter's report, unable to complete dialysis 9/20 due to hypotension.  Me and Nephrologist met with family 9/21.  Nephrologist indicated that patient is overall poor HD candidate going forward.  Family transitioned to full comfort care.  Type II DM with hypoglycemia: Most likely complicated by poor oral intake.  Discontinued all insulins but continue to monitor CBGs closely and treat hypoglycemia per protocol.  If CBGs consistently elevated >180 then consider customized SSI.  A1c 5.1 on 8/15.  Since poor oral intake, diet was changed to regular diet on 9/18.  Ongoing recurrent hypoglycemia.  Hypothyroid: Continue Synthroid.  Essential hypertension: Currently controlled off of medicines.  Bystolic had been held due to hypotension.  Monitor for now.  Chronic systolic CHF: Volume management across HD.  Does not appear overtly volume overloaded.  History of breast cancer status post mastectomy.  Anemia of ESRD: Hemoglobin has gradually dropped from 9.1 > 8.7 > 7.8.  FOBT + but no overt bleeding.  Hemoglobin stable in the 8 range over the last 2 days.  History of A. fib: Currently in sinus rhythm.  Bystolic on hold due to initial hypotension.  Was not on anticoagulation PTA, now on anticoagulation for DVT.  Continue to hold Bystolic due to hypotension reported on HD.  Hyperlipidemia: Statins.  Adult failure to thrive: Multifactorial due to very advanced age, multiple  severe significant comorbidities and suspect some element of dementia.  As discussed above, met with patient's daughter and son in [place] meeting along with nephrologist on 9/21 and family transition patient to full comfort care.  Discussed with palliative care team.  Pulmonary nodules: Seen on CTA chest and reportedly stable.  Given her overall failure to thrive, probably no need for follow-up.   DVT prophylaxis: None, full comfort care now. Code Status: DNR and full comfort care. Family Communication: Discussed with patient's son and daughter. Disposition: Clinical social work consulted for residential hospice placement.  Consultants:  Nephrology. Palliative care team  Procedures:  HD.  Antimicrobials:  Cefepime-discontinued.   Subjective: Very sleepy, barely arousable, says her name and then drifts back to sleep within seconds.  Hypoglycemic this morning.  ROS: Unable due to mental status changes.  Objective:  Vitals:   12/05/17 1245 12/05/17 2135 12/06/17 0429 12/06/17 0657  BP: (!) 96/54 126/82  (!) 121/41  Pulse: 89 74  79  Resp: _0 Temp: 98 F (36.7 C) 98 F (36.7 C)  98.1 F (36.7 C)  TempSrc: Oral Oral  Oral  SpO2: 99% 99%  96%  Weight: 69 kg  67 kg   Height:        Examination: No significant change in exam from yesterday except for mental status as described above.  General exam: Pleasant elderly female, small built, frail and  chronically ill looking, lying comfortably in bed.  Does not appear to be in any distress.  Not fidgety. Respiratory system: Poor inspiratory effort.  Slightly diminished breath sounds in the bases but otherwise clear to auscultation. Respiratory effort normal.  No change/stable. Cardiovascular system: S1 & S2 heard, RRR. No JVD, murmurs, rubs, gallops or clicks. No pedal edema.  Telemetry personally reviewed: Sinus rhythm.?  A. fib at times. Gastrointestinal system: Abdomen is nondistended, soft and nontender. No organomegaly or  masses felt. Normal bowel sounds heard.  Stable. Central nervous system: Mental status as indicated above. No focal neurological deficits. Extremities: Symmetric 5 x 5 power.  Right upper arm AV fistula.  Right lower extremity: Hyperpigmentation from knee down.  Right knee surgical scar healed by secondary intention.  Right thigh scar of skin graft.  No other acute findings such as erythema, open wounds or tenderness.  Does have small blisters on dorsum of some toes. Skin: No rashes, lesions or ulcers Psychiatry: Judgement and insight impaired. Mood & affect cannot assess due to mental status change..     Data Reviewed: I have personally reviewed following labs and imaging studies  CBC: Recent Labs  Lab 12/01/17 2057 12/02/17 0648 12/03/17 0600 12/04/17 0631 12/05/17 0711 12/06/17 0528  WBC 9.4 9.0 9.1 11.6* 12.8* 11.7*  NEUTROABS 8.0*  --   --   --   --   --   HGB 9.1* 8.7* 7.8* 8.1* 8.9* 12.4  HCT 28.9* 27.7* 24.4* 25.3* 27.6* 38.0  MCV 87.6 88.5 86.2 86.3 87.1 86.0  PLT 171 174 210 200 304 962   Basic Metabolic Panel: Recent Labs  Lab 12/01/17 1955 12/02/17 0648 12/03/17 0830 12/05/17 0900  NA 139  --  139 136  K 3.7  --  3.8 3.5  CL 99  --  100 100  CO2 24  --  26 24  GLUCOSE 148*  --  65* 135*  BUN 13  --  20 18  CREATININE 4.06* 4.40* 5.41* 4.40*  CALCIUM 8.0*  --  7.9* 8.1*  PHOS  --   --  3.5 2.7   Liver Function Tests: Recent Labs  Lab 12/01/17 1955 12/03/17 0830 12/05/17 0900  AST 35  --   --   ALT 16  --   --   ALKPHOS 215*  --   --   BILITOT 0.9  --   --   PROT 5.8*  --   --   ALBUMIN 2.3* 1.7* 1.9*   CBG: Recent Labs  Lab 12/05/17 1742 12/05/17 2134 12/06/17 0828 12/06/17 0843 12/06/17 0903  GLUCAP 74 84 35* 30* 72    Recent Results (from the past 240 hour(s))  Culture, blood (routine x 2)     Status: Abnormal   Collection Time: 12/02/17 12:32 AM  Result Value Ref Range Status   Specimen Description BLOOD LEFT ANTECUBITAL  Final    Special Requests   Final    BOTTLES DRAWN AEROBIC AND ANAEROBIC Blood Culture results may not be optimal due to an inadequate volume of blood received in culture bottles   Culture  Setup Time   Final    GRAM POSITIVE RODS IN BOTH AEROBIC AND ANAEROBIC BOTTLES CRITICAL RESULT CALLED TO, READ BACK BY AND VERIFIED WITH: L SEAY PHARMD [id number] 12/03/17 A BROWNING    Culture (A)  Final    DIPHTHEROIDS(CORYNEBACTERIUM SPECIES) Standardized susceptibility testing for this organism is not available. Performed at [facility], [place] [address], [place], [place]  [address]    Report Status 12/05/2017 FINAL  Final  MRSA PCR Screening     Status: None   Collection Time: 12/02/17  4:38 AM  Result Value Ref Range Status   MRSA by PCR NEGATIVE NEGATIVE Final    Comment:        The GeneXpert MRSA Assay (FDA approved for NASAL specimens only), is one component of a comprehensive MRSA colonization surveillance program. It is not intended to diagnose MRSA infection nor to guide or monitor treatment for MRSA infections. Performed at [facility], [place] [address], [place], [place] [address]   Culture, blood (routine x 2)     Status: None (Preliminary result)   Collection Time: 12/02/17  6:40 AM  Result Value Ref Range Status   Specimen Description BLOOD LEFT HAND  Final   Special Requests   Final    BOTTLES DRAWN AEROBIC ONLY Blood Culture adequate volume   Culture   Final    NO GROWTH 4 DAYS Performed at [facility], [place] [address], [place], [place] [address]    Report Status PENDING  Incomplete         Radiology Studies: No results found.      Scheduled Meds: . Chlorhexidine Gluconate Cloth  6 each Topical Q0600  . darbepoetin (ARANESP) injection - DIALYSIS  200 mcg Intravenous Q Wed-HD  . famotidine  20 mg Oral Daily  . feeding supplement (PRO-STAT SUGAR FREE 64)  30 mL Oral BID  . levothyroxine  88 mcg Oral QAC breakfast  . pantoprazole  40 mg Oral Daily  .  sevelamer carbonate  800 mg Oral TID WC  . thiamine  500 mg Oral Daily  . warfarin  1 mg Oral ONCE-1800  . Warfarin - Pharmacist Dosing Inpatient   Does not apply q1800   Continuous Infusions: . heparin Stopped (12/06/17 1123)     LOS: 4 days     Vernell Leep, MD, FACP, [facility]. [facility] Pager [phone]  If 7PM-7AM, please contact night-coverage [email or web address] Password [email or web address] 12/06/2017, 1:33 PM

## 2017-12-06 NOTE — Progress Notes (Signed)
CBG 35. Pt Alert able to drink OJ. Rechecked CBG 30. Given IV dextrose. MD aware.

## 2017-12-06 NOTE — Progress Notes (Signed)
Pt resting no needs at this time. Heparin infusing

## 2017-12-07 DIAGNOSIS — R627 Adult failure to thrive: Secondary | ICD-10-CM

## 2017-12-07 LAB — CULTURE, BLOOD (ROUTINE X 2)
Culture: NO GROWTH
Special Requests: ADEQUATE

## 2017-12-07 NOTE — Progress Notes (Signed)
Progress Note   Patient was transitioned to full comfort care and transferred to the palliative care unit on 9/21.  Dr. Rowe Pavy, Palliative MD input and follow-up much appreciated.  I met patient and granddaughter in room this morning.  Patient sleepy, barely arousable, briefly smiled and went back to sleep.  As per granddaughter, had an uneventful night, slept well without significant pain issues or discomfort issues.  Apparently had been more alert later part of the day yesterday when her extended family came to visit her.  She even ate some cake.  Pleasant elderly female, chronically ill looking, lying comfortably in bed.  Does not appear in any distress. Temperature 98.3, pulse 67/min, respiratory rate 15/min, BP 99/69, oxygen saturation 96%.  RS: Diminished breath sounds mostly in the bases.  No wheezing or rhonchi.  No increased work of breathing. CVS: S1 and S2 heard.  No pedal edema or JVD. CNS: Mental status as above.  No focal neurological deficits.  Assessment and plan:  Adult failure to thrive: Multifactorial due to very advanced age, multiple severe significant comorbidities including ESRD on HD, mental status changes, new acute right lower extremity DVT, chronic diarrhea especially after oral intake suggestive of gastroparesis, type II DM with frequent hypoglycemic episodes.  Now transitioned to full comfort care and awaiting inpatient residential hospice bed for transfer.  I discussed in detail with patient's granddaughter at bedside.  Comforted and reassured her.  Vernell Leep, MD, FACP, [facility]. [facility] Pager [phone]  If 7PM-7AM, please contact night-coverage [email or web address] Password [facility] 12/07/2017, 4:12 PM

## 2017-12-07 NOTE — Progress Notes (Signed)
Daily Progress Note   Patient Name: [name]       Date: 12/07/2017 DOB: Nov 06, 1928  Age: 84 y.o. MRN#: [id number] Attending Physician: Modena Jansky, MD Primary Care Physician: Binnie Rail, MD Admit Date: 12/01/2017  Reason for Consultation/Follow-up: Establishing goals of care  Subjective:  patient appears weak and confused she is resting in bed. At time she opens her eyes, does not verbalize much. Denies being in pain. Family present at the bedside.   Oral intake is minimal.  I met with daughter [name] outside the patient's room and re discussed with her, see below.   Length of Stay: 5  Current Medications: Scheduled Meds:  . Chlorhexidine Gluconate Cloth  6 each Topical Q0600  . feeding supplement (PRO-STAT SUGAR FREE 64)  30 mL Oral BID    Continuous Infusions:   PRN Meds: acetaminophen **OR** acetaminophen (TYLENOL) oral liquid 160 mg/5 mL **OR** acetaminophen, fentaNYL (SUBLIMAZE) injection, loperamide, LORazepam  Physical Exam         Frail, weak appearing elderly lady resting in bed  Does not appear to be in any acute distress Diminished breath sounds S1-S2 Patient has some edema right lower extremity, patient has hyperpigmentation, scars, some blisters. No open wounds Abdomen is not distended Appears to have generalized weakness  Vital Signs: BP 99/69 (BP Location: Left Arm)   Pulse 67   Temp 98.3 F (36.8 C) (Axillary)   Resp 15   Ht _0  (1.549 m)   Wt 67 kg   SpO2 96%   BMI 27.91 kg/m  SpO2: SpO2: 96 % O2 Device: O2 Device: Room Air O2 Flow Rate: O2 Flow Rate (L/min): 2 L/min  Intake/output summary:   Intake/Output Summary (Last 24 hours) at 12/07/2017 1043 Last data filed at 12/07/2017 0400 Gross per 24 hour  Intake 100 ml  Output 1  ml  Net 99 ml   LBM: Last BM Date: 12/06/17(small) Baseline Weight: Weight: 52.7 kg Most recent weight: Weight: 67 kg       Palliative Assessment/Data:    Flowsheet Rows     Most Recent Value  Intake Tab  Referral Department  Hospitalist  Unit at Time of Referral  Cardiac/Telemetry Unit  Palliative Care Primary Diagnosis  Nephrology  Date Notified  12/04/17  Palliative Care Type  New Palliative care  Reason for referral  Advance Care Planning, Clarify Goals of Care, Counsel Regarding Hospice  Date of Admission  12/01/17  Date first seen by Palliative Care  12/05/17  # of days Palliative referral response time  1 Day(s)  # of days IP prior to Palliative referral  3  Clinical Assessment  Palliative Performance Scale Score  40%  Pain Max last 24 hours  Not able to report  Pain Min Last 24 hours  Not able to report  Dyspnea Max Last 24 Hours  Not able to report  Dyspnea Min Last 24 hours  Not able to report  Psychosocial & Spiritual Assessment  Palliative Care Outcomes  Patient/Family meeting held?  Yes  Who was at the meeting?  daughter [name] via phone  Patient/Family wishes: Interventions discontinued/not started   Mechanical Ventilation      Patient Active Problem List   Diagnosis Date Noted  . Goals of care, counseling/discussion   . Palliative care by specialist   . Encounter for hospice care discussion   . Acute encephalopathy 12/02/2017  . Poor balance 10/30/2017  . Physical deconditioning 10/30/2017  . Frequent falls 10/30/2017  . Cough 10/30/2017  . Leg fracture 08/13/2016  . Hypothyroidism 08/30/2015  . Labile blood glucose   . Memory loss   . Hip fracture ([place]) 07/12/2015  . ESRD on dialysis ([place])   . PAF (paroxysmal atrial fibrillation) ([place])   . Chronic combined systolic and diastolic heart failure EF 45-50% by echo 06/25/2012 07/30/2012  . Breast cancer, s/p Lt mastectomy 07/22/2012  . Pleural effusion due to CHF (congestive heart failure) ([place])  07/20/2012  . Hypotension, unspecified 06/25/2012  . Metabolic bone disease 52/77/8242  . Secondary hyperparathyroidism of renal origin ([place]) 06/19/2012  . Nonunion of fracture, R tibia 06/17/2012  . H/O radioactive iodine thyroid ablation   . Closed fracture of right fibula and tibia 07/10/2011  . HTN (hypertension) 07/10/2011  . DM2 (diabetes mellitus, type 2) ([place]) 07/10/2011  . Hyperlipidemia 07/10/2011  . Anemia 07/10/2011    Palliative Care Assessment & Plan   Patient Profile:    Assessment:  end-stage renal disease patient was on Monday Wednesday Friday hemodialysis for the past 8 years now. Admitted this time with acute right lower extremity deep vein thrombosis Generalized decline, recurrent falls, progressive decline for the past few months History of diabetes Atrial fibrillation History of breast cancer status post mastectomy Memory loss Hypertension Noted to have history of chronic combined systolic and diastolic heart failure ejection fraction 45-50 percent by surface echocardiogram 2014   Recommendations/Plan:  Continue comfort measures  Fentanyl IV PRN is being used  Discussed with daughter [name], she is tearful, she states that she is wondering if the family made the right decision, as far as, d/c dialysis and focusing on comfort measures. I offered her support, presence and we re discussed. Ms [name] states that she agrees with continuing comfort measures. Awaiting hospice facility arrangements. Family strongly desires hospice of [place], [place], [facility].   Continue supportive comfort measures. All of the family's questions answered to the best of my ability.   Appreciate chaplain consult, spiritual care for additional support for the family at this time. Prognosis likely less than 2 weeks at this time.  Goals of Care and Additional Recommendations:  Limitations on Scope of Treatment: Full Comfort Care  Code Status:    Code Status Orders  (From  admission, onward)         Start     Ordered  12/02/17 0135  Do not attempt resuscitation (DNR)  Continuous    Question Answer Comment  In the event of cardiac or respiratory ARREST Do not call a "code blue"   In the event of cardiac or respiratory ARREST Do not perform Intubation, CPR, defibrillation or ACLS   In the event of cardiac or respiratory ARREST Use medication by any route, position, wound care, and other measures to relive pain and suffering. May use oxygen, suction and manual treatment of airway obstruction as needed for comfort.      12/02/17 0138        Code Status History    Date Active Date Inactive Code Status Order ID Comments User Context   12/02/2017 0027 12/02/2017 0138 DNR [id number]  Pattricia Boss, MD ED   08/13/2016 2354 08/17/2016 2034 Full Code [id number]  Karmen Bongo, MD Inpatient   07/12/2015 1510 07/28/2015 2037 Full Code [id number]  Cathlyn Parsons, PA-C Inpatient   06/25/2012 0440 06/27/2012 0038 Full Code [id number]  Toy Baker, MD Inpatient    Advance Directive Documentation     Most Recent Value  Type of Advance Directive  Healthcare Power of Attorney  Pre-existing out of facility DNR order (yellow form or pink MOST form)  -  "MOST" Form in Place?  -      PPS 20% Prognosis:   < 2 weeks  Discharge Planning:  Hospice facility  Care plan was discussed with  Patient,daughter   Thank you for allowing the Palliative Medicine Team to assist in the care of this patient.   Time In: 10 Time Out: 10.25 Total Time 25 Prolonged Time Billed  no       Greater than 50%  of this time was spent counseling and coordinating care related to the above assessment and plan.  Loistine Chance, MD [phone]  Please contact Palliative Medicine Team phone at [phone] for questions and concerns.

## 2017-12-07 NOTE — Progress Notes (Signed)
[facility] Liaison RN note.  Received request from [facility] [place] for family interest in [facility]. Chart reviewed and spoke with daughter, [name] to acknowledge referral.  Unfortunately [facility] is not able to offer a room today. Family and CSW are aware [facility] liaison will follow up with CSW and family tomorrow or sooner if room becomes available.   Please do not hesitate to call with questions.  Thank you,  Farrel Gordon, RN, [facility] Liaison  [place] are on AMION

## 2017-12-07 NOTE — Progress Notes (Signed)
[facility] for hospice referral. No beds at this time. [facility] will f/u with family for potential placement.

## 2017-12-08 MED ORDER — LORAZEPAM 2 MG/ML IJ SOLN: 0.5000 mg | Freq: Four times a day (QID) | INTRAMUSCULAR | Status: AC | PRN

## 2017-12-08 MED ORDER — FENTANYL CITRATE (PF) 100 MCG/2ML IJ SOLN: 12.5000 ug | INTRAMUSCULAR | Status: AC | PRN

## 2017-12-08 MED ORDER — LOPERAMIDE HCL 2 MG PO CAPS: 2.0000 mg | ORAL_CAPSULE | Freq: Three times a day (TID) | ORAL | Status: AC | PRN

## 2017-12-08 NOTE — Social Work (Addendum)
Clinical Social Worker facilitated patient discharge including contacting patient family and facility to confirm patient discharge plans.  Clinical information faxed to facility and family agreeable with plan.  CSW arranged ambulance transport via [facility] to [facility]. RN to call [phone] with report prior to discharge.  Clinical Social Worker will sign off for now as social work intervention is no longer needed. Please consult [place] again if new need arises.  12:10pm- Spoke with pt daughter [name], she was inquiring as to if pt was being transported at 12:30pm, she confirmed that this timing is okay with her, but that she had been waiting on a call. Had questions related to if PT[facility] transportation is covered- CSW is unable to provide confirmation as to if [name] is covered, information will be submitted to pt's Medicare benefit. Discussed Medical Necessity sheet (pt daughter is RN Tourist information centre manager at [employer]), she requested it to state that pt is non ambulatory. Pt daughter also states that on medical necessity form pt is not "considered a hospice pt until she gets to [facility],"- per [facility] CSW.   CSW signing off. Please consult if any additional needs arise.  [name], [place] Work [phone]

## 2017-12-08 NOTE — Progress Notes (Signed)
Daily Progress Note   Patient Name: [name]       Date: 12/08/2017 DOB: [birth year]  Age: 84 y.o. MRN#: [id number] Attending Physician: Modena Jansky, MD Primary Care Physician: Binnie Rail, MD Admit Date: 12/01/2017  Reason for Consultation/Follow-up: Establishing goals of care  Subjective:  patient is resting in bed She is able to answer a few questions appropriately Son and some other family members present at the bedside.   Discussed with TRH MD See below    Length of Stay: 6  Current Medications: Scheduled Meds:  . Chlorhexidine Gluconate Cloth  6 each Topical Q0600  . feeding supplement (PRO-STAT SUGAR FREE 64)  30 mL Oral BID    Continuous Infusions:   PRN Meds: acetaminophen **OR** acetaminophen (TYLENOL) oral liquid 160 mg/5 mL **OR** acetaminophen, fentaNYL (SUBLIMAZE) injection, loperamide, LORazepam  Physical Exam         Frail, weak appearing elderly lady resting in bed  Does not appear to be in any acute distress Diminished breath sounds S1-S2 Patient has some edema right lower extremity, patient has hyperpigmentation, scars, some blisters. No open wounds Abdomen is not distended Appears to have generalized weakness  Vital Signs: BP (!) 130/44 (BP Location: Left Arm)   Pulse 91   Temp 98.3 F (36.8 C) (Axillary)   Resp 18   Ht 5\' 1"  (1.549 m)   Wt 67 kg   SpO2 99%   BMI 27.91 kg/m  SpO2: SpO2: 99 % O2 Device: O2 Device: Room Air O2 Flow Rate: O2 Flow Rate (L/min): 2 L/min  Intake/output summary:  No intake or output data in the 24 hours ending 12/08/17 0951 LBM: Last BM Date: 12/07/17 Baseline Weight: Weight: 52.7 kg Most recent weight: Weight: 67 kg       Palliative Assessment/Data:    Flowsheet Rows     Most Recent Value    Intake Tab  Referral Department  Hospitalist  Unit at Time of Referral  Cardiac/Telemetry Unit  Palliative Care Primary Diagnosis  Nephrology  Date Notified  12/04/17  Palliative Care Type  New Palliative care  Reason for referral  Advance Care Planning, Clarify Goals of Care, Counsel Regarding Hospice  Date of Admission  12/01/17  Date first seen by Palliative Care  12/05/17  # of days Palliative referral response time  1 Day(s)  # of days IP prior to Palliative referral  3  Clinical Assessment  Palliative Performance Scale Score  40%  Pain Max last 24 hours  Not able to report  Pain Min Last 24 hours  Not able to report  Dyspnea Max Last 24 Hours  Not able to report  Dyspnea Min Last 24 hours  Not able to report  Psychosocial & Spiritual Assessment  Palliative Care Outcomes  Patient/Family meeting held?  Yes  Who was at the meeting?  daughter [name] via phone  Patient/Family wishes: Interventions discontinued/not started   Mechanical Ventilation      Patient Active Problem List   Diagnosis Date Noted  . Goals of care, counseling/discussion   . Palliative care by specialist   . Encounter for hospice care discussion   . Acute encephalopathy 12/02/2017  . Poor balance 10/30/2017  . Physical deconditioning 10/30/2017  . Frequent falls 10/30/2017  . Cough 10/30/2017  . Leg fracture 08/13/2016  . Hypothyroidism 08/30/2015  . Labile blood glucose   . Memory loss   . Hip fracture ([place]) 07/12/2015  . ESRD on dialysis ([place])   . PAF (paroxysmal atrial fibrillation) ([place])   . Chronic combined systolic and diastolic heart failure EF 45-50% by echo 06/25/2012 07/30/2012  . Breast cancer, s/p Lt mastectomy 07/22/2012  . Pleural effusion due to CHF (congestive heart failure) ([place]) 07/20/2012  . Hypotension, unspecified 06/25/2012  . Metabolic bone disease 01/60/1093  . Secondary hyperparathyroidism of renal origin ([place]) 06/19/2012  . Nonunion of fracture, R tibia 06/17/2012  .  H/O radioactive iodine thyroid ablation   . Closed fracture of right fibula and tibia 07/10/2011  . HTN (hypertension) 07/10/2011  . DM2 (diabetes mellitus, type 2) ([place]) 07/10/2011  . Hyperlipidemia 07/10/2011  . Anemia 07/10/2011    Palliative Care Assessment & Plan   Patient Profile:    Assessment:  end-stage renal disease patient was on Monday Wednesday Friday hemodialysis for the past 8 years now. Admitted this time with acute right lower extremity deep vein thrombosis Generalized decline, recurrent falls, progressive decline for the past few months History of diabetes Atrial fibrillation History of breast cancer status post mastectomy Memory loss Hypertension Noted to have history of chronic combined systolic and diastolic heart failure ejection fraction 45-50 percent by surface echocardiogram 2014   Recommendations/Plan:  Continue comfort measures  Fentanyl IV PRN is being used  Agree wit residential hospice on discharge.   Prognosis likely less than 2 weeks at this time.  Goals of Care and Additional Recommendations:  Limitations on Scope of Treatment: Full Comfort Care  Code Status:    Code Status Orders  (From admission, onward)         Start     Ordered   12/02/17 0135  Do not attempt resuscitation (DNR)  Continuous    Question Answer Comment  In the event of cardiac or respiratory ARREST Do not call a "code blue"   In the event of cardiac or respiratory ARREST Do not perform Intubation, CPR, defibrillation or ACLS   In the event of cardiac or respiratory ARREST Use medication by any route, position, wound care, and other measures to relive pain and suffering. May use oxygen, suction and manual treatment of airway obstruction as needed for comfort.      12/02/17 0138        Code Status History    Date Active Date Inactive Code Status Order ID Comments User Context   12/02/2017 0027 12/02/2017  0138 DNR [id number]  Pattricia Boss, MD ED   08/13/2016 2354  08/17/2016 2034 Full Code [id number]  Karmen Bongo, MD Inpatient   07/12/2015 1510 07/28/2015 2037 Full Code [id number]  Cathlyn Parsons, PA-C Inpatient   06/25/2012 0440 06/27/2012 0038 Full Code [id number]  Toy Baker, MD Inpatient    Advance Directive Documentation     Most Recent Value  Type of Advance Directive  Healthcare Power of Attorney  Pre-existing out of facility DNR order (yellow form or pink MOST form)  -  "MOST" Form in Place?  -      PPS 20% Prognosis:   < 2 weeks  Discharge Planning:  Hospice facility  Care plan was discussed with  Patient, son   Thank you for allowing the Palliative Medicine Team to assist in the care of this patient.   Time In: 9 Time Out: 9.25 Total Time 25 Prolonged Time Billed  no       Greater than 50%  of this time was spent counseling and coordinating care related to the above assessment and plan.  Loistine Chance, MD [phone]  Please contact Palliative Medicine Team phone at [phone] for questions and concerns.

## 2017-12-08 NOTE — Progress Notes (Signed)
Pt discharged to [facility] place via ptar this pm

## 2017-12-08 NOTE — Social Work (Signed)
Pt approved for bed at [facility], liaison Harmon Pier will complete paperwork with pt daughter [name]. When paperwork complete will fax d/c summary and arrange PTAR.  [name], [place] Work [phone]

## 2017-12-08 NOTE — Progress Notes (Signed)
Pt scheduled for discharge to [facility] place this pm, pick up time scheduled for 12:30pm. Report called and given to receiving facility nurse with no concerns voiced

## 2017-12-08 NOTE — Discharge Summary (Signed)
Physician Discharge Summary  [name] CVE:[id number] DOB: 09-01-1928  PCP: Binnie Rail, MD  Admit date: 12/01/2017 Discharge date: 12/08/2017  Recommendations for Outpatient Follow-up:  1. MD at [facility] for end-of-life hospice care. 2. Billey Gosling, PCP informing.  Home Health: N/A Equipment/Devices: N/A    Discharge Condition: Poor prognosis and expected decline. CODE STATUS: DNR Diet recommendation: Regular diet/comfort feeds of choice.  Discharge Diagnoses:  Principal Problem:   Acute encephalopathy Active Problems:   HTN (hypertension)   DM2 (diabetes mellitus, type 2) (HCC)   Anemia   Breast cancer, s/p Lt mastectomy   Chronic combined systolic and diastolic heart failure EF 45-50% by echo 06/25/2012   ESRD on dialysis ([place])   PAF (paroxysmal atrial fibrillation) (HCC)   Memory loss   Hypothyroidism   Goals of care, counseling/discussion   Palliative care by specialist   Encounter for hospice care discussion   Brief Summary: 84 year old female with PMH of ESRD on MWF HD, HTN, DM 2, HLD, chronic systolic CHF, hypothyroid, A. fib on anticoagulation, progressively declining for the last couple of months, was brought to the ED 12/02/2017 after she became increasingly confused during HD, 2 days history of increasing difficulty moving right lower extremity, having frequent falls, on and off diarrhea for 3 to 4 days and poor appetite.  In ED, mildly hypotensive with elevated lactate, improved after IV saline bolus, noted new right lower extremity swelling.  CTA chest was negative for PE, CT head unremarkable.  Admitted for acute encephalopathy, right lower extremity swelling which is now diagnosed with DVT.  After extensive discussion with daughter, initiated IV heparin drip and Coumadin bridging.  Nephrology following for dialysis needs. On 9/21, I met with patient's daughter and son along with Dr. Melvia Heaps, Nephrology for goals of care family meeting.  Discussed in  detail regarding inpatient evaluation and care, persisting mental status changes, poor oral intake, recurrent hypoglycemia, overall poor prognosis.  Family opted to transition to full comfort care, DC all measures not pertaining to comfort including HD.  Clinical social work consulted for residential hospice placement.   Assessment & Plan:   Acute right lower extremity DVT/Chronic LLE DVT: Initially felt to have cellulitis and had been empirically started on antibiotics.  Lower extremity venous Dopplers 9/17 showed acute DVT involving the right posterior tibial vein and right peroneal vein and chronic DVT in the left peroneal vein.  She was deemed to be high risk patient for bleeding complications given advanced age, overall physical and mental decline over the last couple of months, fall risk, FOBT +, has been taken off Coumadin in the past for bleeding.  This was discussed in detail with patient's daughter regarding options of aggressive management with anticoagulation with IV heparin bridge and Coumadin versus IVC filter.  Patient's daughter who is a case Freight forwarder at [employer] decided in favor of IV heparin drip and Coumadin.  She hoped that patient would improve enough to take her home and be able to continue her outpatient dialysis.  However if patient unable to participate in outpatient dialysis, daughter was considering hospice.  Started IV heparin 9/17.  Started Coumadin per pharmacy 9/18.  Clinically patient's right lower extremity findings were not consistent with cellulitis.  Discontinued empirically started vancomycin and cefepime. On 9/21, However family transitioned patient to full comfort care.  Discontinue anticoagulation and all medications nonessential to comfort.  One set of blood cultures showed gram-positive rods.  I discussed with pharmacy in detail.  They recommend that  this is likely diphtheroids/contaminant and no indication to use resume antibiotics.  Acute encephalopathy:  Unclear etiology.  CT head and MRI brain without acute abnormalities.  No obvious infectious etiology identified.  TSH normal 8/15.  Checking thiamine level and started empiric thiamine supplementation.  Delirium precautions.  Minimize sedative medications.  Reorient.  Now comfort care.  Patient more alert, oriented x2 and communicative but mental status apparently waxing and waning.  Likely to progressively become somnolent with discontinuation of dialysis.  Diarrhea. chronic: Unclear etiology.  Ultrasound abdomen without acute findings.  Abdomen benign on exam. PRN Imodium.  Low index of suspicion for C. difficile.  ESRD on MWF HD: Nephrology following.  As per daughter's report, unable to complete dialysis 9/20 due to hypotension.  Me and Nephrologist met with family 9/21.  Nephrologist indicated that patient is overall poor HD candidate going forward.  Family transitioned to full comfort care.  Type II DM with hypoglycemia: Most likely complicated by poor oral intake.  Discontinued all insulins but continue to monitor CBGs closely and treat hypoglycemia per protocol.  If CBGs consistently elevated >180 then consider customized SSI.  A1c 5.1 on 8/15.  Since poor oral intake, diet was changed to regular diet on 9/18.  Ongoing recurrent hypoglycemia.  Comfort feeds of choice.  Hypothyroid:   Essential hypertension:   Chronic systolic CHF: Does not appear overtly volume overloaded.  History of breast cancer status post mastectomy.  Anemia of ESRD: Hemoglobin has gradually dropped from 9.1 > 8.7 > 7.8.  FOBT + but no overt bleeding.  Hemoglobin stable in the 8 range  History of A. fib: Currently in sinus rhythm.  Bystolic on hold due to initial hypotension.  Was not on anticoagulation PTA.  Hyperlipidemia:   Adult failure to thrive: Multifactorial due to very advanced age, multiple severe significant comorbidities and suspect some element of dementia.  As discussed above, met with  patient's daughter and son in [place] meeting along with nephrologist on 9/21 and family transitioned patient to full comfort care.  Discussed with palliative care team.  Patient had been progressively declining for several months.  Pulmonary nodules: Seen on CTA chest and reportedly stable.    Consultants:  Nephrology. Palliative care team  Procedures:  HD.   Discharge Instructions  Discharge Instructions    Call MD for:  difficulty breathing, headache or visual disturbances   Complete by:  As directed    Call MD for:  extreme fatigue   Complete by:  As directed    Call MD for:  persistant dizziness or light-headedness   Complete by:  As directed    Call MD for:  persistant nausea and vomiting   Complete by:  As directed    Call MD for:  severe uncontrolled pain   Complete by:  As directed    Call MD for:  temperature >100.4   Complete by:  As directed    Diet general   Complete by:  As directed    Discharge instructions   Complete by:  As directed    Diet: Comfort feeds of choice.   Increase activity slowly   Complete by:  As directed        Medication List    STOP taking these medications   acetaminophen 325 MG tablet Commonly known as:  TYLENOL   aspirin EC 325 MG tablet   aspirin EC 81 MG tablet   b complex-vitamin c-folic acid 0.8 MG Tabs tablet   darbepoetin 100 MCG/0.5ML Soln injection  Commonly known as:  ARANESP   diclofenac sodium 1 % Gel Commonly known as:  VOLTAREN   doxycycline 100 MG tablet Commonly known as:  VIBRA-TABS   feeding supplement (NEPRO CARB STEADY) Liqd   glipiZIDE 5 MG tablet Commonly known as:  GLUCOTROL   levothyroxine 75 MCG tablet Commonly known as:  SYNTHROID, LEVOTHROID   levothyroxine 88 MCG tablet Commonly known as:  SYNTHROID, LEVOTHROID   methocarbamol 500 MG tablet Commonly known as:  ROBAXIN   nebivolol 10 MG tablet Commonly known as:  BYSTOLIC   omeprazole 20 MG capsule Commonly known as:  PRILOSEC    potassium chloride SA 20 MEQ tablet Commonly known as:  K-DUR,KLOR-CON   pravastatin 20 MG tablet Commonly known as:  PRAVACHOL   sevelamer carbonate 800 MG tablet Commonly known as:  RENVELA   VITAMIN D-3 PO     TAKE these medications   fentaNYL 100 MCG/2ML injection Commonly known as:  SUBLIMAZE Inject 0.25 mLs (12.5 mcg total) into the vein every 3 (three) hours as needed for moderate pain or severe pain.   loperamide 2 MG capsule Commonly known as:  IMODIUM Take 1 capsule (2 mg total) by mouth 3 (three) times daily as needed for diarrhea or loose stools.   LORazepam 2 MG/ML injection Commonly known as:  ATIVAN Inject 0.25 mLs (0.5 mg total) into the vein every 6 (six) hours as needed for anxiety.      Follow-up Information    MD at [facility] Follow up.   Why:  For ongoing hospice care.       Binnie Rail, MD Follow up.   Specialty:  Internal Medicine Why:  [facility] information: [address] [place] [address] [phone]          Allergies  Allergen Reactions  . Codeine Shortness Of Breath, Swelling and Other (See Comments)    Swelling of the tongue, but patient tolerates Vicodin  . Coreg [Carvedilol] Other (See Comments)    Headaches   . Lopressor [Metoprolol Tartrate] Other (See Comments)    GI upset  . Metoprolol Other (See Comments)    GI upset  . Oxycodone Other (See Comments)    Hallucinations (also with Oxycontin)  . Tape Other (See Comments)    TAPE, especially PLASTIC, PULLS OFF THE SKIN!!      Procedures/Studies: Dg Chest 2 View  Result Date: 12/01/2017 CLINICAL DATA:  Confusion and disorientation. Normally alert and oriented. EXAM: CHEST - 2 VIEW COMPARISON:  10/30/2017. FINDINGS: The heart is enlarged. Calcified ascending aorta. No consolidation or edema. No effusion or pneumothorax. Hyperinflation suggesting COPD. Osteopenia. IMPRESSION: COPD. Cardiomegaly. Thoracic atherosclerosis. No active infiltrates or failure.  Improved aeration from priors. Electronically Signed   By: Staci Righter M.D.   On: 12/01/2017 20:36   Ct Head Wo Contrast  Result Date: 12/01/2017 CLINICAL DATA:  Altered mental status EXAM: CT HEAD WITHOUT CONTRAST TECHNIQUE: Contiguous axial images were obtained from the base of the skull through the vertex without intravenous contrast. COMPARISON:  None. FINDINGS: Brain: There is no mass, hemorrhage or extra-axial collection. The size and configuration of the ventricles and extra-axial CSF spaces are normal. There is no acute or chronic infarction. There is hypoattenuation of the periventricular white matter, most commonly indicating chronic ischemic microangiopathy. Vascular: No abnormal hyperdensity of the major intracranial arteries or dural venous sinuses. No intracranial atherosclerosis. Skull: The visualized skull base, calvarium and extracranial soft tissues are normal. Sinuses/Orbits: No fluid levels or advanced mucosal  thickening of the visualized paranasal sinuses. No mastoid or middle ear effusion. The orbits are normal. IMPRESSION: Chronic ischemic microangiopathy without acute intracranial abnormality. Electronically Signed   By: Ulyses Jarred M.D.   On: 12/01/2017 21:57   Ct Angio Chest Pe W And/or Wo Contrast  Result Date: 12/01/2017 CLINICAL DATA:  Confusion and disorientation since Saturday. EXAM: CT ANGIOGRAPHY CHEST WITH CONTRAST TECHNIQUE: Multidetector CT imaging of the chest was performed using the standard protocol during bolus administration of intravenous contrast. Multiplanar CT image reconstructions and MIPs were obtained to evaluate the vascular anatomy. CONTRAST:  50 cc ISOVUE-370 IOPAMIDOL (ISOVUE-370) INJECTION 76% COMPARISON:  12/02/2013 FINDINGS: Cardiovascular: The study is of quality for the evaluation of pulmonary embolism. There are no filling defects in the central, lobar, segmental or subsegmental pulmonary artery branches to suggest acute pulmonary embolism.  Atherosclerosis of the great vessels with conventional branch pattern left main and three-vessel coronary arteriosclerosis. Mediastinum/Nodes: No discrete thyroid nodules. Unremarkable esophagus. No pathologically enlarged axillary, mediastinal or hilar lymph nodes. Lungs/Pleura: No pneumothorax. No pleural effusion. Redemonstration of multiple bilateral noncalcified pulmonary nodules, measuring on the order of 5 mm or less. No confluent airspace opacity. Upper abdomen: Fat containing right Bochdalek  hernia. Musculoskeletal:  No aggressive appearing focal osseous lesions. Review of the MIP images confirms the above findings. IMPRESSION: 1. Stable cardiomegaly with aortic atherosclerosis and coronary arteriosclerosis. 2. No acute pulmonary embolus. 3. Scattered noncalcified 5 mm or less pulmonary nodules unchanged in appearance. Given long-term stability, findings likely to represent a benign etiology. Aortic Atherosclerosis (ICD10-I70.0). Electronically Signed   By: Ashley Royalty M.D.   On: 12/01/2017 22:01   Mr Brain Wo Contrast  Result Date: 12/02/2017 CLINICAL DATA:  Initial evaluation for acute altered mental status, increased confusion. EXAM: MRI HEAD WITHOUT CONTRAST TECHNIQUE: Multiplanar, multiecho pulse sequences of the brain and surrounding structures were obtained without intravenous contrast. COMPARISON:  Prior CT from 12/01/2017. FINDINGS: Brain: Generalized age-related cerebral atrophy. Patchy T2/FLAIR hyperintensity within the periventricular and deep white matter both cerebral hemispheres most consistent with chronic small vessel ischemic disease, moderate nature. No abnormal foci of restricted diffusion to suggest acute or subacute ischemia. Gray-white matter differentiation maintained. No areas of remote cortical infarction. No acute intracranial hemorrhage. Few punctate chronic micro hemorrhages noted within the left occipital region. No mass lesion, midline shift or mass effect. No  hydrocephalus. No extra-axial fluid collection. Pituitary gland normal. Vascular: Major intracranial vascular flow voids maintained. Diminutive vertebrobasilar system noted. Skull and upper cervical spine: Craniocervical junction normal. Upper cervical spine within normal limits. Bone marrow signal intensity normal. No scalp soft tissue abnormality. Sinuses/Orbits: Globes and orbital soft tissues within normal limits. Patient status post ocular lens replacement bilaterally. Paranasal sinuses are clear. Small right mastoid effusion, of doubtful significance. Inner ear structures normal. Other: None. IMPRESSION: 1. No acute intracranial abnormality. 2. Age-related cerebral atrophy with moderate chronic small vessel ischemic disease. Electronically Signed   By: Jeannine Boga M.D.   On: 12/02/2017 03:07   US Abdomen Complete  Result Date: 12/02/2017 CLINICAL DATA:  Initial evaluation for acute nausea. EXAM: ABDOMEN ULTRASOUND COMPLETE COMPARISON:  None. FINDINGS: Gallbladder: No gallstones or wall thickening visualized. No sonographic Murphy sign noted by sonographer. Common bile duct: Diameter: 7.2 mm Liver: No focal lesion identified. Within normal limits in parenchymal echogenicity. Portal vein is patent on color Doppler imaging with normal direction of blood flow towards the liver. IVC: No abnormality visualized. Pancreas: Visualized portion unremarkable. Spleen: Size and appearance within normal limits.  Right Kidney: Length: 6.1 cm. Atrophic with diffuse cortical thinning. Diffusely increased echogenicity. No hydronephrosis. 1.0 x 1.2 x 1.1 cm simple cyst noted. Left Kidney: Length: 5.5 cm. Atrophic with diffuse cortical thinning. Diffusely increased echogenicity. No mass or hydronephrosis visualized. Abdominal aorta: No aneurysm visualized. Other findings: None. IMPRESSION: 1. No sonographic evidence for acute abnormality within the abdomen. 2. Atrophic kidneys with associated cortical thinning and  diffusely increased echogenicity within the renal parenchyma, compatible with chronic medical renal disease. No hydronephrosis. 3. 1.2 cm simple right renal cyst. Electronically Signed   By: Jeannine Boga M.D.   On: 12/02/2017 03:50   Dg Pelvis Portable  Result Date: 12/02/2017 CLINICAL DATA:  Bilateral hip pain after a fall EXAM: PORTABLE PELVIS 1-2 VIEWS COMPARISON:  08/14/2016 FINDINGS: Diffuse bone demineralization. Degenerative changes in the lower lumbar spine and in both hips. No evidence of acute fracture or dislocation of the right hip. There is infiltration in the subcutaneous fat lateral to the right hip likely indicating subcutaneous contusion or hematoma. No dislocation of the right hip. An intramedullary rod is partially identified in the proximal femoral shaft. Calcifications in the pelvis consistent with phleboliths. Additional calcifications may represent fibroids are calcified lymph nodes. Vascular calcifications. IMPRESSION: No acute bony abnormalities. Probable contusion or hematoma in the soft tissues lateral to the right hip. Electronically Signed   By: Lucienne Capers M.D.   On: 12/02/2017 06:56      Subjective: Patient seen this morning in her room along with her son and her sister at bedside.  Patient is alert and oriented to self and place and to person but not to her sister.  Still seems slightly confused and answers inappropriately at times.  Follows simple instructions.  Denies complaints or pain.  As per family, had a restful night.  Received infrequent doses of IV fentanyl for pain.  Does not appear to be in pain.  Ate a little.  Discharge Exam:  Vitals:   12/06/17 0429 12/06/17 0657 12/07/17 0532 12/08/17 0548  BP:  (!) 121/41 99/69 (!) 130/44  Pulse:  79 67 91  Resp:  _0 Temp:  98.1 F (36.7 C) 98.3 F (36.8 C) 98.3 F (36.8 C)  TempSrc:  Oral Axillary Axillary  SpO2:  96% 96% 99%  Weight: 67 kg     Height:       General exam: Pleasant  elderly female, small built, frail and chronically ill looking, lying comfortably in bed.  Does not appear to be in any distress.  Not fidgety.  Respiratory system: Poor inspiratory effort.  Slightly diminished breath sounds in the bases but otherwise clear to auscultation. Respiratory effort normal.   Cardiovascular system: S1 & S2 heard, RRR. No JVD, murmurs, rubs, gallops or clicks. No pedal edema.   Gastrointestinal system: Abdomen is nondistended, soft and nontender. No organomegaly or masses felt. Normal bowel sounds heard.   Central nervous system: Mental status as indicated above. No focal neurological deficits. Extremities: Symmetric 5 x 5 power.  Right upper arm AV fistula.  Right lower extremity: Hyperpigmentation from knee down.  Right knee surgical scar healed by secondary intention.  Right thigh scar of skin graft.  No other acute findings such as erythema, open wounds or tenderness.  Does have small blisters on dorsum of some toes. Skin: No rashes, lesions or ulcers Psychiatry: Judgement and insight impaired. Mood & affect pleasant this morning.     The results of significant diagnostics from this hospitalization (including imaging,  microbiology, ancillary and laboratory) are listed below for reference.     Microbiology: Recent Results (from the past 240 hour(s))  Culture, blood (routine x 2)     Status: Abnormal   Collection Time: 12/02/17 12:32 AM  Result Value Ref Range Status   Specimen Description BLOOD LEFT ANTECUBITAL  Final   Special Requests   Final    BOTTLES DRAWN AEROBIC AND ANAEROBIC Blood Culture results may not be optimal due to an inadequate volume of blood received in culture bottles   Culture  Setup Time   Final    GRAM POSITIVE RODS IN BOTH AEROBIC AND ANAEROBIC BOTTLES CRITICAL RESULT CALLED TO, READ BACK BY AND VERIFIED WITH: L SEAY PHARMD 1610 12/03/17 A BROWNING    Culture (A)  Final    DIPHTHEROIDS(CORYNEBACTERIUM SPECIES) Standardized susceptibility  testing for this organism is not available. Performed at [facility], [place] [address], [place], [place] [address]    Report Status 12/05/2017 FINAL  Final  MRSA PCR Screening     Status: None   Collection Time: 12/02/17  4:38 AM  Result Value Ref Range Status   MRSA by PCR NEGATIVE NEGATIVE Final    Comment:        The GeneXpert MRSA Assay (FDA approved for NASAL specimens only), is one component of a comprehensive MRSA colonization surveillance program. It is not intended to diagnose MRSA infection nor to guide or monitor treatment for MRSA infections. Performed at [facility], [place] [address], [place], [place] [address]   Culture, blood (routine x 2)     Status: None   Collection Time: 12/02/17  6:40 AM  Result Value Ref Range Status   Specimen Description BLOOD LEFT HAND  Final   Special Requests   Final    BOTTLES DRAWN AEROBIC ONLY Blood Culture adequate volume   Culture   Final    NO GROWTH 5 DAYS Performed at [facility], [place] [address], [place], [place] [address]    Report Status 12/07/2017 FINAL  Final     Labs: CBC: Recent Labs  Lab 12/01/17 2057 12/02/17 0648 12/03/17 0600 12/04/17 0631 12/05/17 0711 12/06/17 0528  WBC 9.4 9.0 9.1 11.6* 12.8* 11.7*  NEUTROABS 8.0*  --   --   --   --   --   HGB 9.1* 8.7* 7.8* 8.1* 8.9* 12.4  HCT 28.9* 27.7* 24.4* 25.3* 27.6* 38.0  MCV 87.6 88.5 86.2 86.3 87.1 86.0  PLT 171 174 210 200 304 829   Basic Metabolic Panel: Recent Labs  Lab 12/01/17 1955 12/02/17 0648 12/03/17 0830 12/05/17 0900  NA 139  --  139 136  K 3.7  --  3.8 3.5  CL 99  --  100 100  CO2 24  --  26 24  GLUCOSE 148*  --  65* 135*  BUN 13  --  20 18  CREATININE 4.06* 4.40* 5.41* 4.40*  CALCIUM 8.0*  --  7.9* 8.1*  PHOS  --   --  3.5 2.7   Liver Function Tests: Recent Labs  Lab 12/01/17 1955 12/03/17 0830 12/05/17 0900  AST 35  --   --   ALT 16  --   --   ALKPHOS 215*  --   --   BILITOT 0.9  --   --   PROT 5.8*  --    --   ALBUMIN 2.3* 1.7* 1.9*   CBG: Recent Labs  Lab 12/05/17 1742 12/05/17 2134 12/06/17 5621  12/06/17 0843 12/06/17 0903  GLUCAP 74 84 35* 30* 72   I discussed with patient's son and patient's sister at bedside and subsequently with patient's daughter via phone.  Updated care and answered questions.  Advised them that patient will now be discharged to [facility].  They verbalized understanding.    Time coordinating discharge: 25 minutes  SIGNED:  Vernell Leep, MD, FACP, [facility]. [facility] Pager [phone]  If 7PM-7AM, please contact night-coverage [email or web address] Password [facility] 12/08/2017, 9:27 AM

## 2017-12-08 NOTE — Progress Notes (Signed)
[facility] room available today for [name]. Met briefly with patient, son and sister at bedside before meeting with daughter [name] at [facility] to complete paper work for transfer today. Dr. Tomasa Hosteller to assume care. Discharge summary has been sent.   RN please call report to [phone].  Thank you,  Erling Conte, LCSW [phone]

## 2017-12-09 ENCOUNTER — Telehealth: Payer: Self-pay | Admitting: *Deleted

## 2017-12-09 NOTE — Telephone Encounter (Signed)
Pt was on TCM report admitted 12/01/17 for Acute encephalopathy. On 9/21, I met with patient's daughter and son along with Dr. Eula Flax for goals of care family meeting. Discussed in detail regarding inpatient evaluation and care, persisting mental status changes, poor oral intake, recurrent hypoglycemia, overall poor prognosis. Pt D/C 12/08/17 sent to [facility] for end-of-life hospice care.Marland KitchenJohny Chess

## 2017-12-10 LAB — VITAMIN B1: Vitamin B1 (Thiamine): 82.6 nmol/L (ref 66.5–200.0)

## 2017-12-16 DEATH — deceased

## 2018-09-25 IMAGING — US US ABDOMEN COMPLETE
1 series · 13 of 25 positions shown · non-contrast
Comparison: None.

CLINICAL DATA: Initial evaluation for acute nausea.

EXAM:
ABDOMEN ULTRASOUND COMPLETE

[Series 1: us abdomen complete · 0.15mm/px · 13 of 84 slices shown]
[im 1/84]
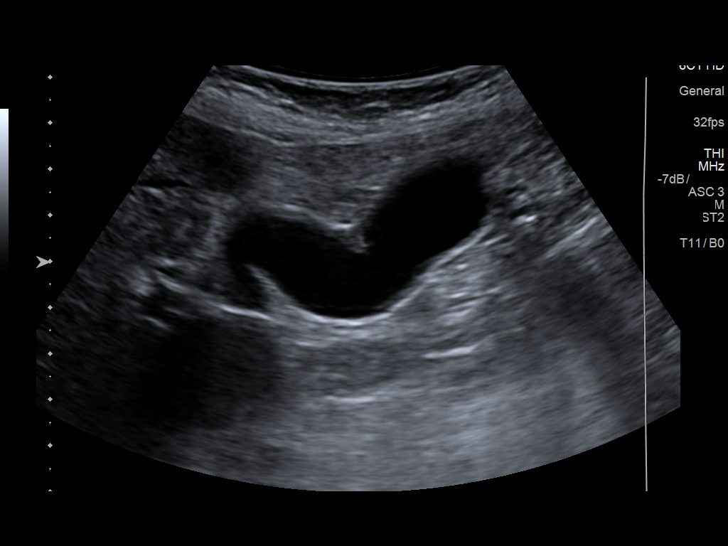
[im 7/84]
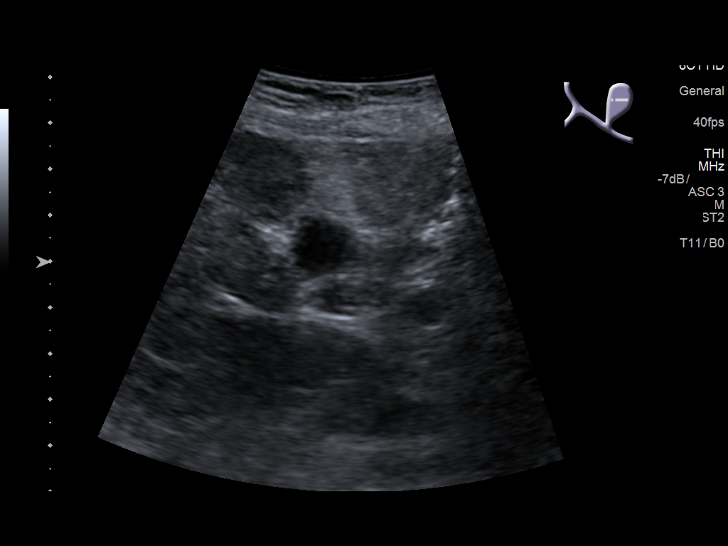
[im 14/84]
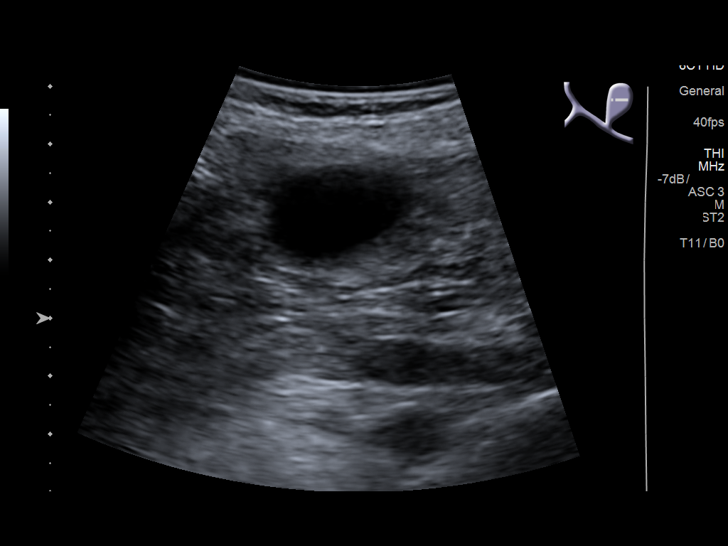
[im 21/84]
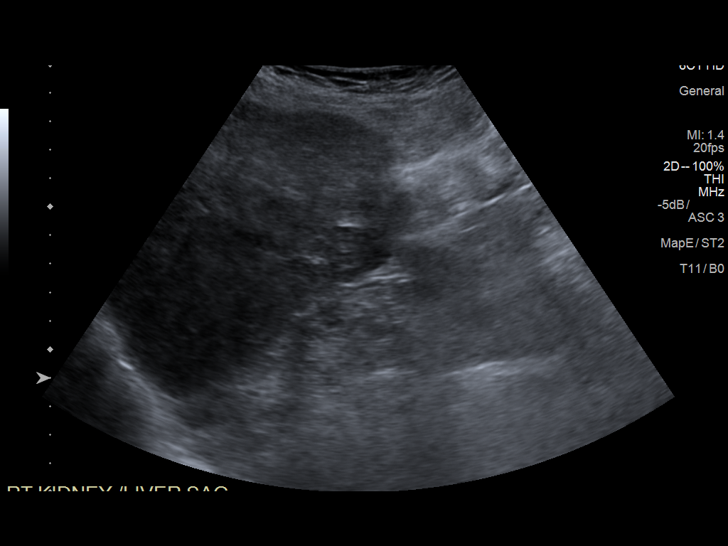
[im 28/84]
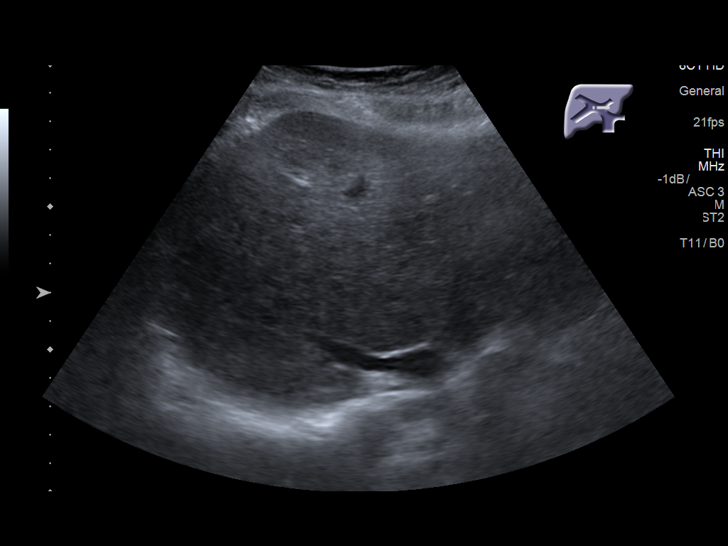
[im 35/84]
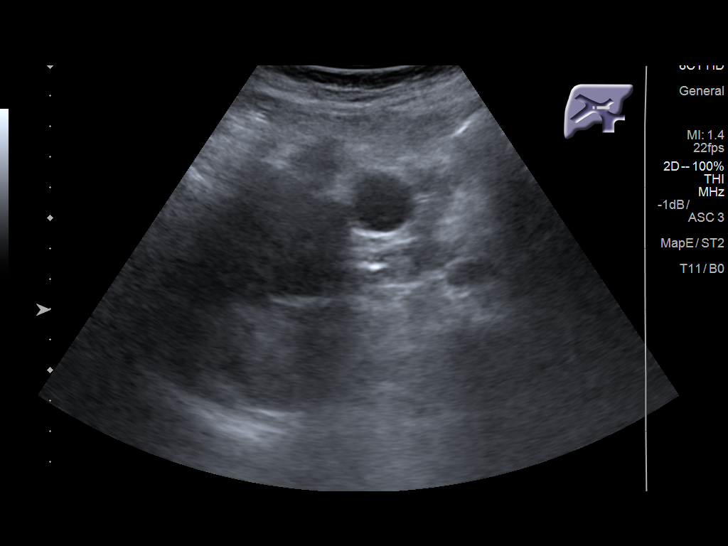
[im 42/84]
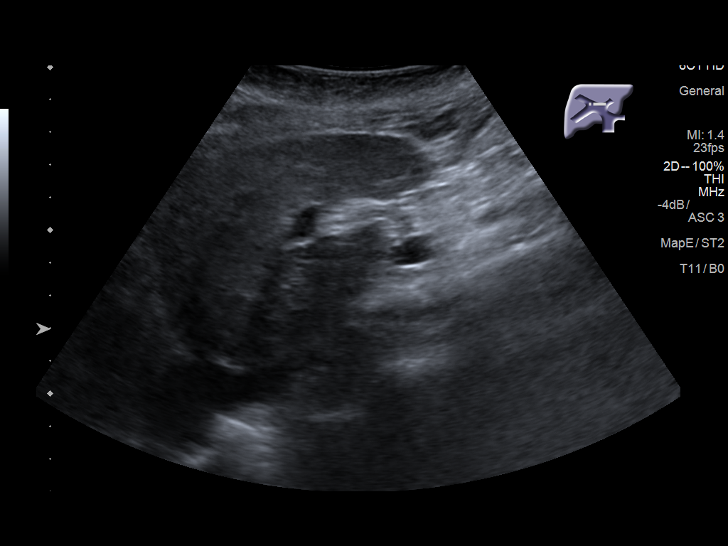
[im 49/84]
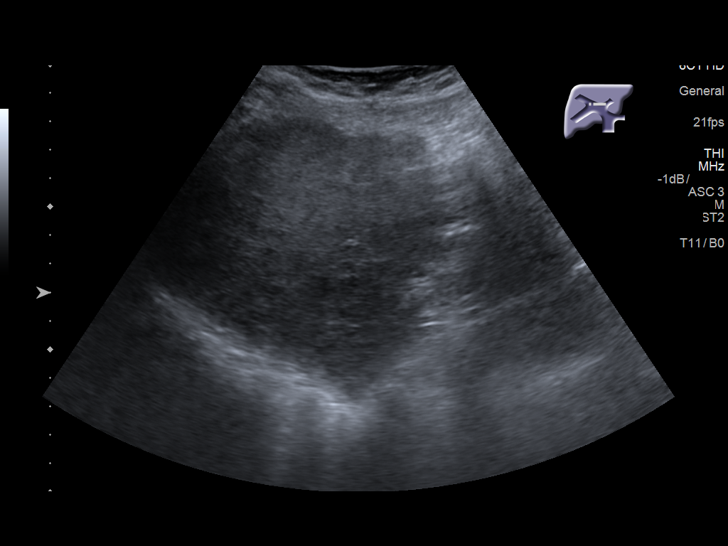
[im 56/84]
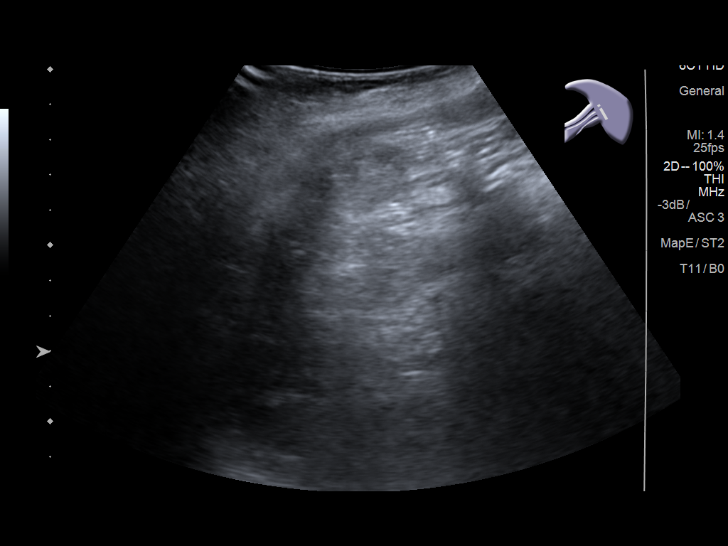
[im 63/84]
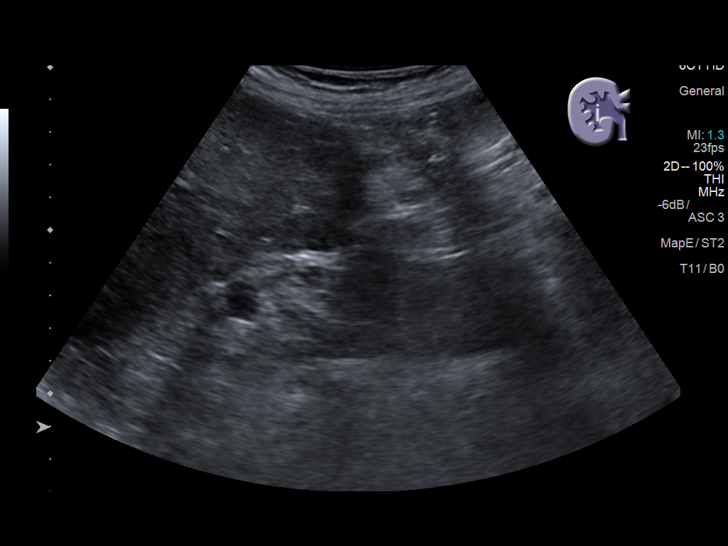
[im 70/84]
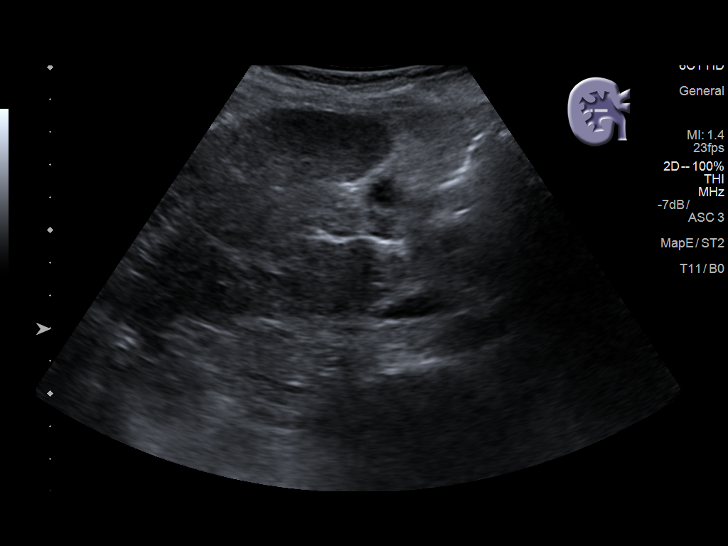
[im 77/84]
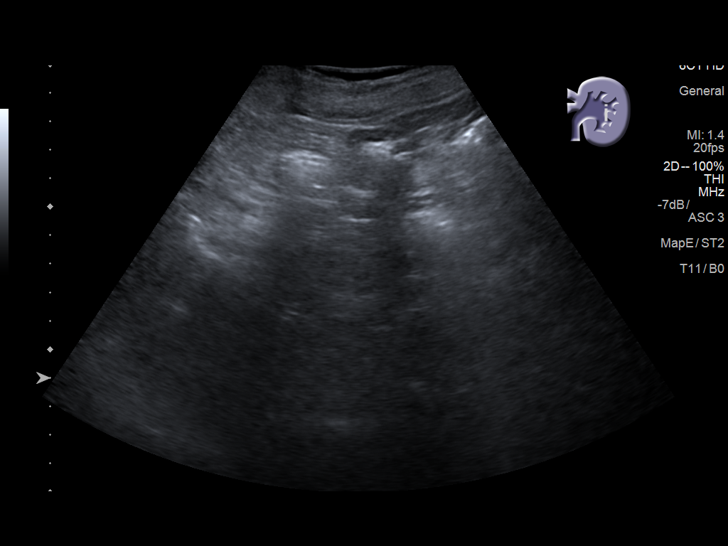
[im 84/84]
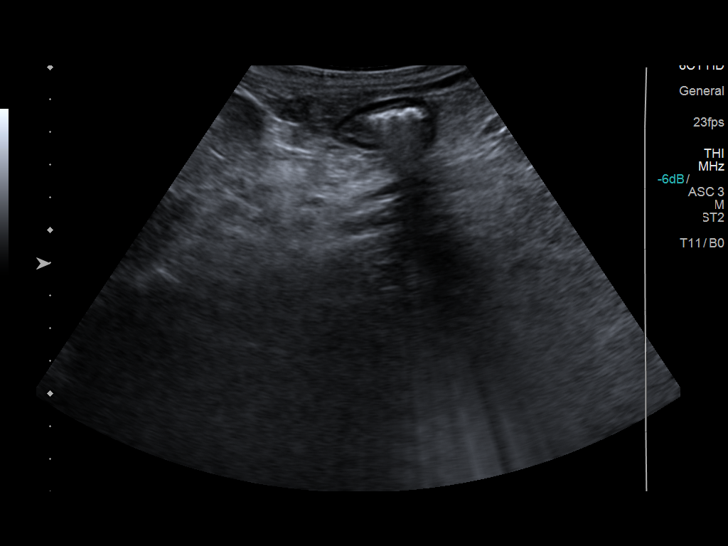

[13 of 25 positions shown; findings below may reference images not displayed]

FINDINGS: Gallbladder: No gallstones or wall thickening visualized. No
sonographic Murphy sign noted by sonographer.

Common bile duct: Diameter: 7.2 mm

Liver: No focal lesion identified. Within normal limits in
parenchymal echogenicity. Portal vein is patent on color Doppler
imaging with normal direction of blood flow towards the liver.

IVC: No abnormality visualized.

Pancreas: Visualized portion unremarkable.

Spleen: Size and appearance within normal limits.

Right Kidney: Length: 6.1 cm. Atrophic with diffuse cortical
thinning. Diffusely increased echogenicity. No hydronephrosis. 1.0 x
1.2 x 1.1 cm simple cyst noted.

Left Kidney: Length: 5.5 cm. Atrophic with diffuse cortical
thinning. Diffusely increased echogenicity. No mass or
hydronephrosis visualized.

Abdominal aorta: No aneurysm visualized.

Other findings: None.
IMPRESSION: 1. No sonographic evidence for acute abnormality within the abdomen.
2. Atrophic kidneys with associated cortical thinning and diffusely
increased echogenicity within the renal parenchyma, compatible with
chronic medical renal disease. No hydronephrosis.
3. 1.2 cm simple right renal cyst.

## 2019-01-23 IMAGING — CT CT ANGIO CHEST
2 of 6 series · 19 of 36 positions shown · IV contrast (iopamidol)
Comparison: 12/02/2013

CLINICAL DATA: Confusion and disorientation since [REDACTED].

EXAM:
CT ANGIOGRAPHY CHEST WITH CONTRAST
TECHNIQUE: Multidetector CT imaging of the chest was performed using the
standard protocol during bolus administration of intravenous
contrast. Multiplanar CT image reconstructions and MIPs were
obtained to evaluate the vascular anatomy.
CONTRAST:  50 cc [id number]-ORZ IOPAMIDOL ([id number]-[id number]) INJECTION 76%

[Series 7: pe thins · axial · 0.62mm/px · z∈[-474,-243]mm · 18 of 368 slices shown]
[im 19/368  lung]
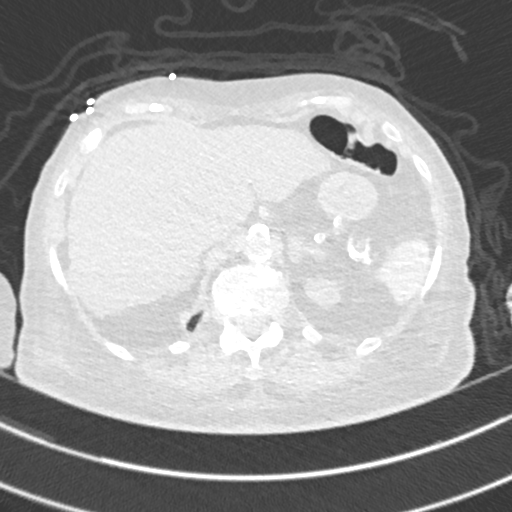
[im 37/368  mediastinal]
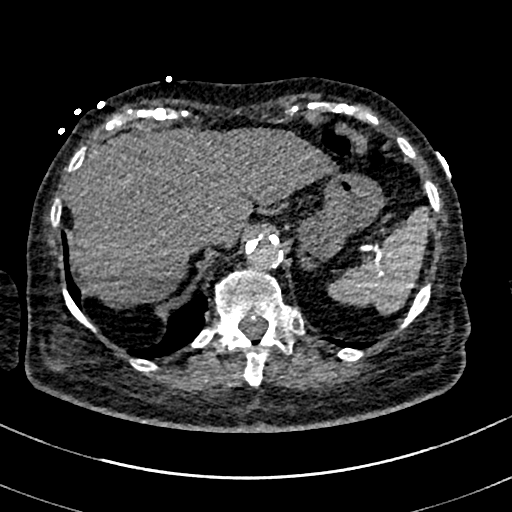
[im 56/368  lung]
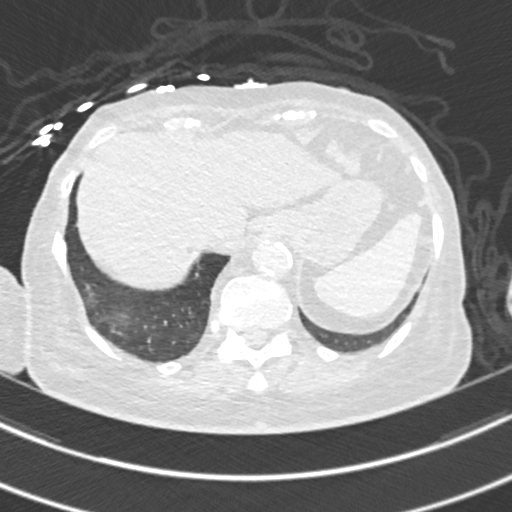
[im 74/368  mediastinal]
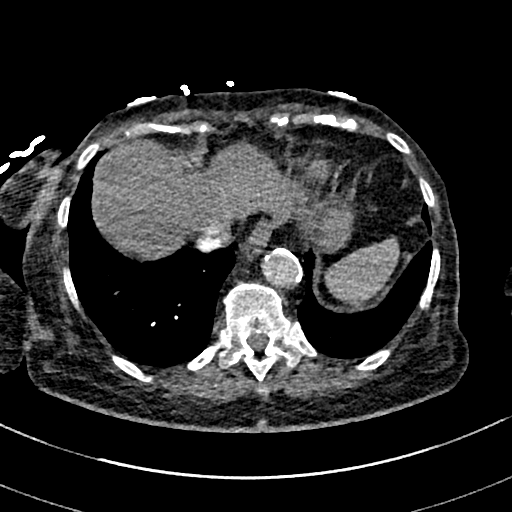
[im 92/368  lung]
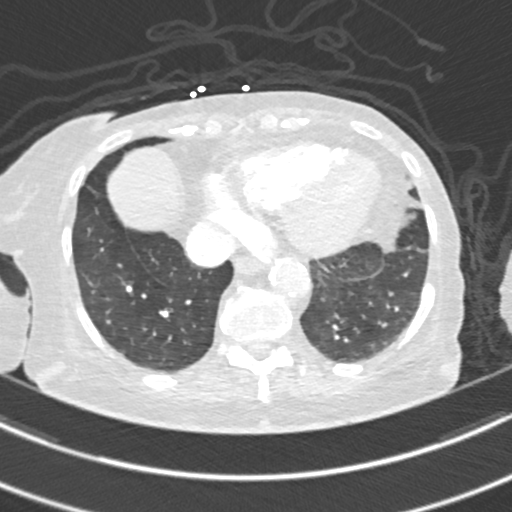
[im 111/368  mediastinal]
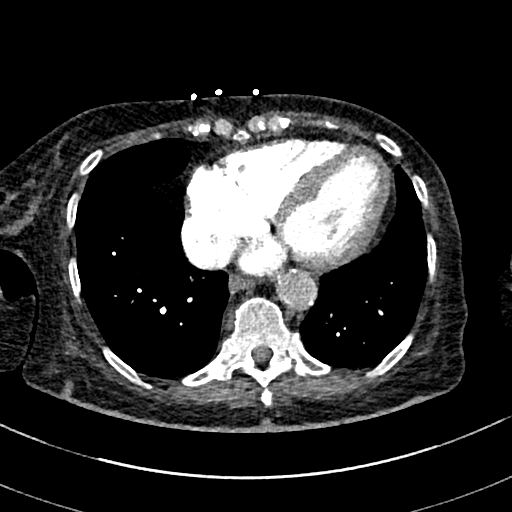
[im 129/368  lung]
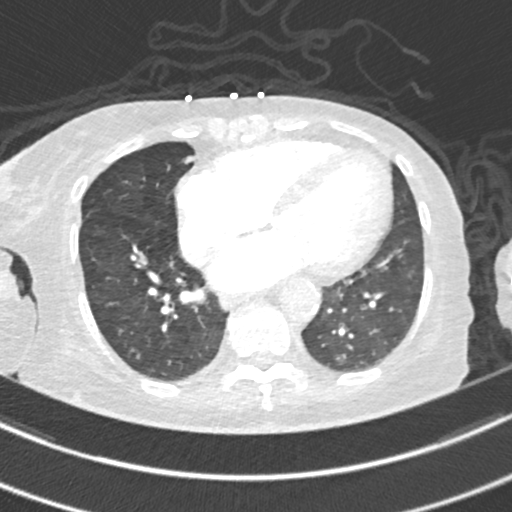
[im 147/368  mediastinal]
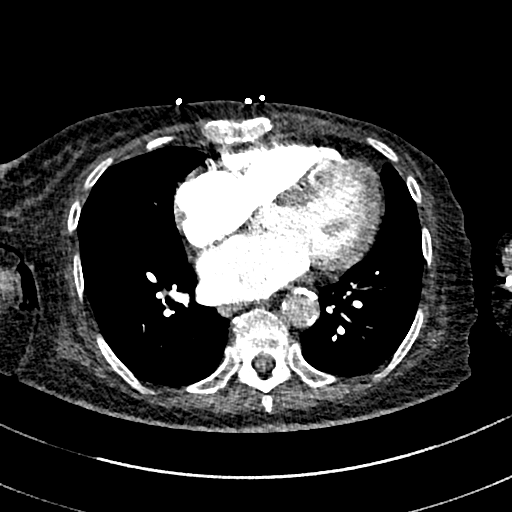
[im 166/368  lung]
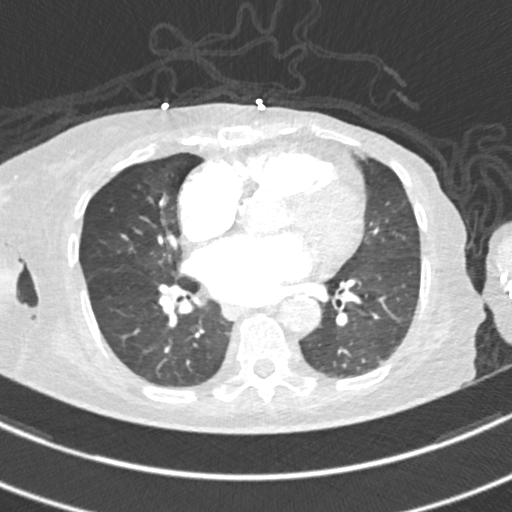
[im 202/368  mediastinal]
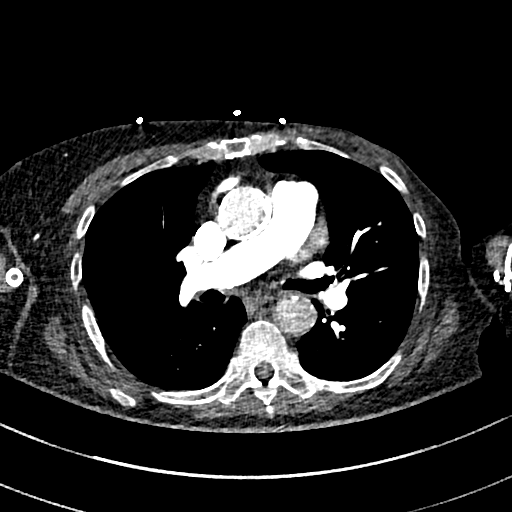
[im 221/368  lung]
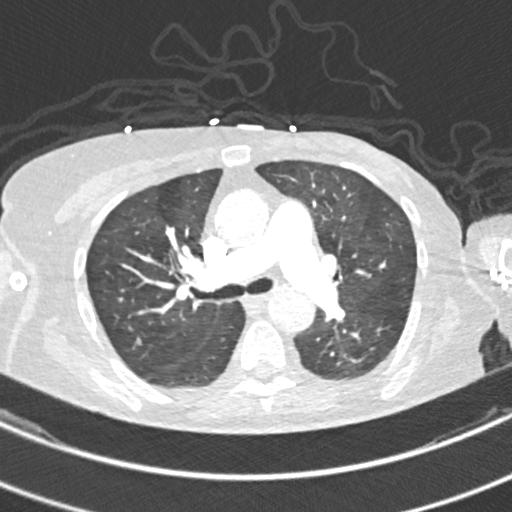
[im 239/368  mediastinal]
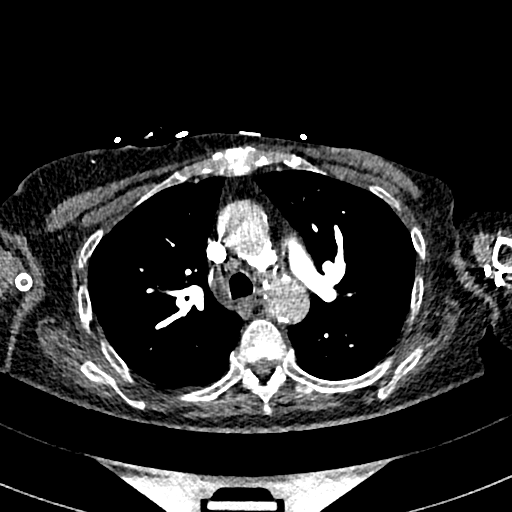
[im 257/368  lung]
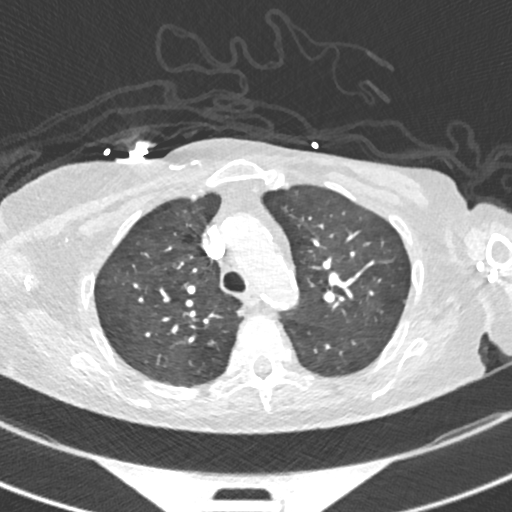
[im 276/368  mediastinal]
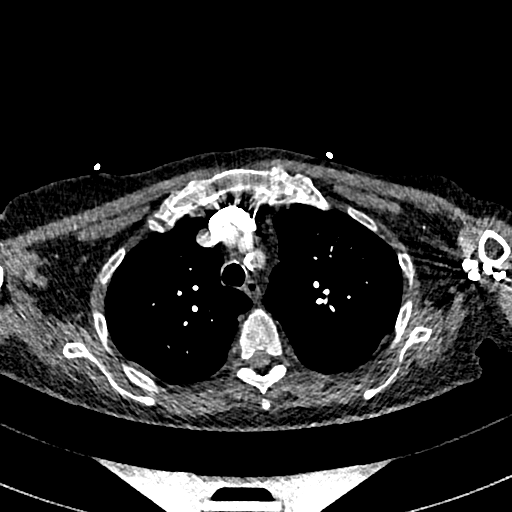
[im 294/368  lung]
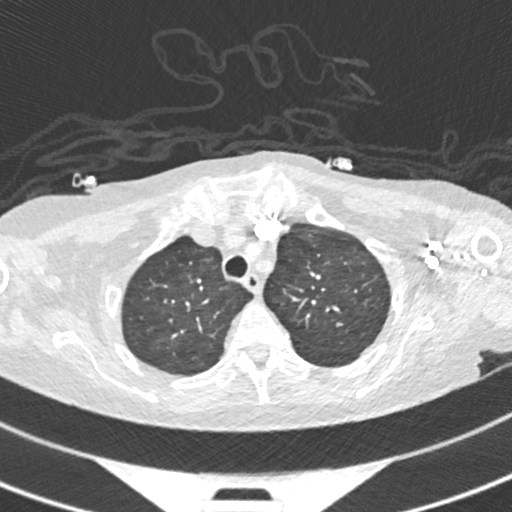
[im 312/368  mediastinal]
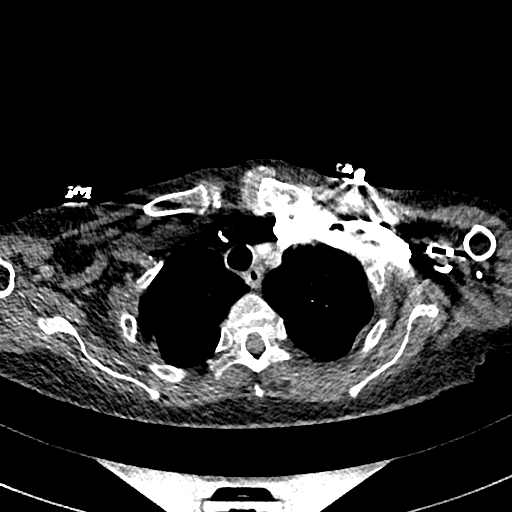
[im 331/368  lung]
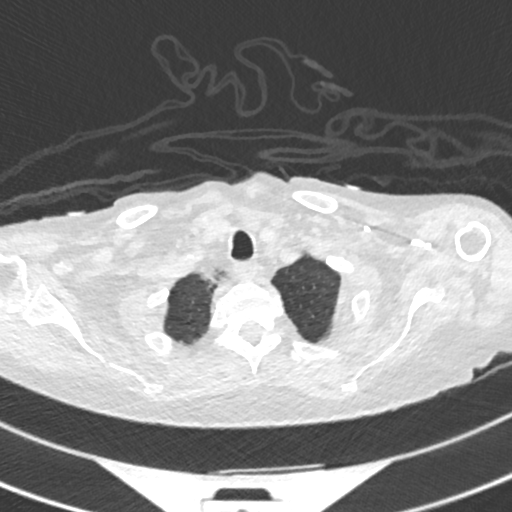
[im 349/368  mediastinal]
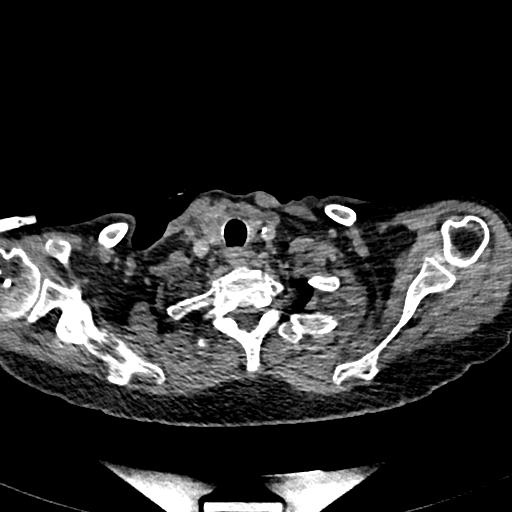

[Series 8: pe 2mm cor · coronal · 0.53mm/px · 1 of 121 slices shown]
[im 61/121  mediastinal]
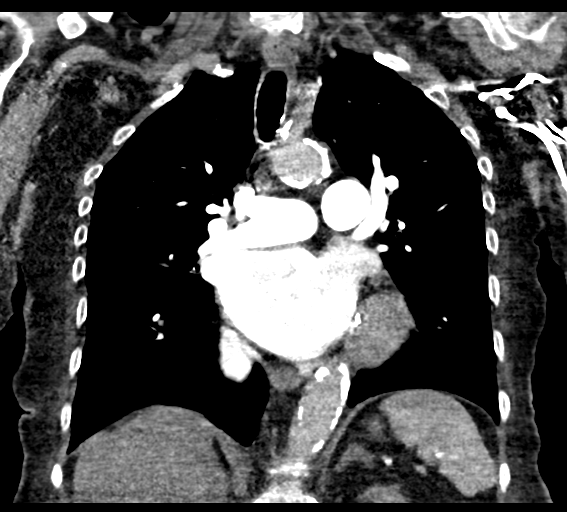

[19 of 36 positions shown; findings below may reference images not displayed]

FINDINGS: Cardiovascular: The study is of quality for the evaluation of
pulmonary embolism. There are no filling defects in the central,
lobar, segmental or subsegmental pulmonary artery branches to
suggest acute pulmonary embolism. Atherosclerosis of the great
vessels with conventional branch pattern left main and three-vessel
coronary arteriosclerosis.

Mediastinum/Nodes: No discrete thyroid nodules. Unremarkable
esophagus. No pathologically enlarged axillary, mediastinal or hilar
lymph nodes.

Lungs/Pleura: No pneumothorax. No pleural effusion. Redemonstration
of multiple bilateral noncalcified pulmonary nodules, measuring on
the order of 5 mm or less. No confluent airspace opacity.

Upper abdomen: Fat containing right Bochdalek  hernia.

Musculoskeletal:  No aggressive appearing focal osseous lesions.

Review of the MIP images confirms the above findings.
IMPRESSION: 1. Stable cardiomegaly with aortic atherosclerosis and coronary
arteriosclerosis.
2. No acute pulmonary embolus.
3. Scattered noncalcified 5 mm or less pulmonary nodules unchanged
in appearance. Given long-term stability, findings likely to
represent a benign etiology.

Aortic Atherosclerosis ([id number]).

## 2019-01-24 IMAGING — DX DG PORTABLE PELVIS
1 series · 2 of 2 positions shown · non-contrast
Comparison: 08/14/2016

CLINICAL DATA: Bilateral hip pain after a fall

EXAM:
PORTABLE PELVIS 1-2 VIEWS

[Series 1: pelvis · 0.14mm/px · 2 of 2 slices shown]
[im 1/2]
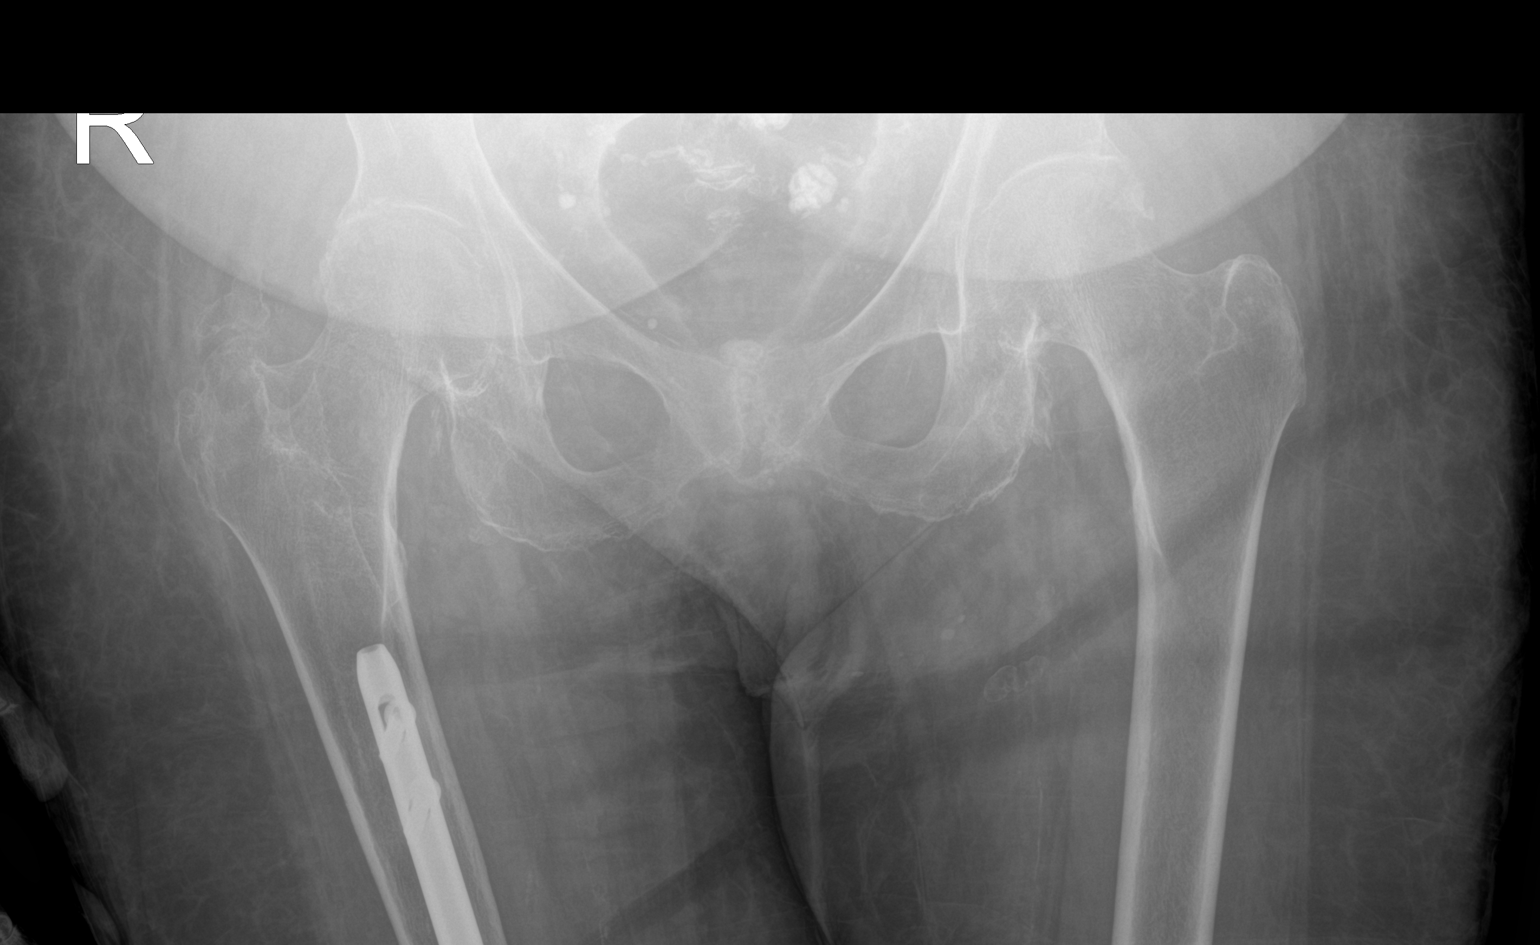
[im 2/2]
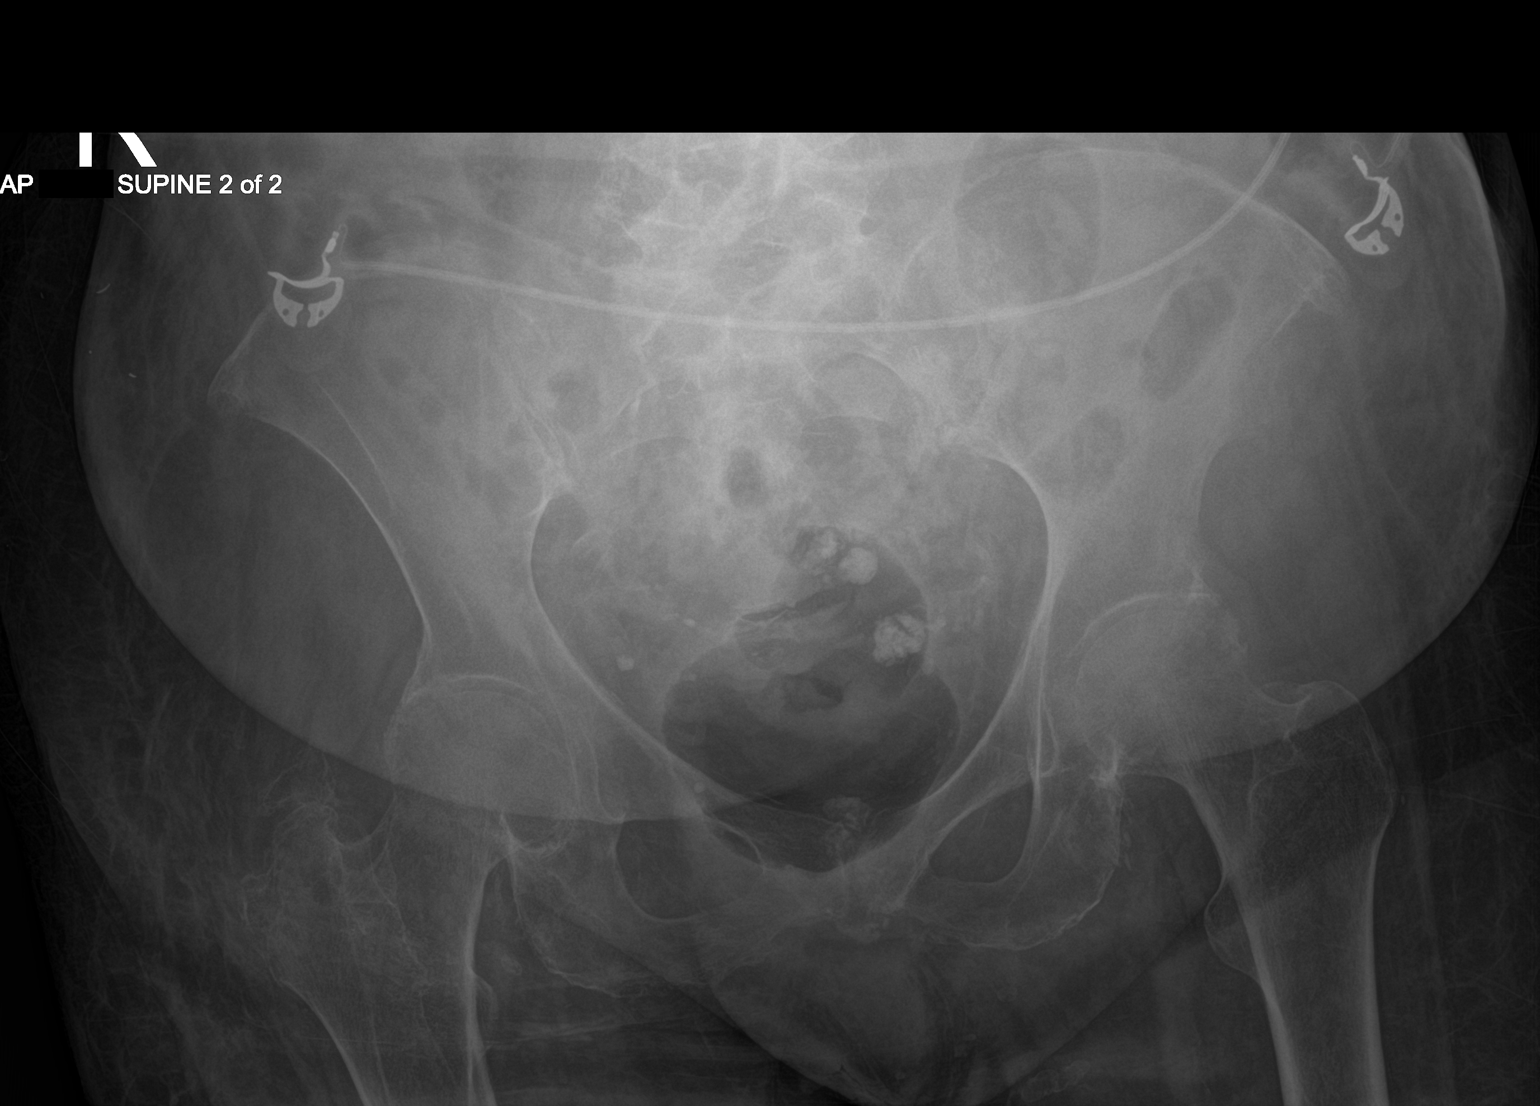

[2 of 2 positions shown; findings below may reference images not displayed]

FINDINGS: Diffuse bone demineralization. Degenerative changes in the lower
lumbar spine and in both hips. No evidence of acute fracture or
dislocation of the right hip. There is infiltration in the
subcutaneous fat lateral to the right hip likely indicating
subcutaneous contusion or hematoma. No dislocation of the right hip.
An intramedullary rod is partially identified in the proximal
femoral shaft. Calcifications in the pelvis consistent with
phleboliths. Additional calcifications may represent fibroids are
calcified lymph nodes. Vascular calcifications.
IMPRESSION: No acute bony abnormalities. Probable contusion or hematoma in the
soft tissues lateral to the right hip.
